# Patient Record
Sex: Male | Born: 1970 | Race: Black or African American | Hispanic: No | Smoking: Current some day smoker
Health system: Southern US, Community
[De-identification: ages and names within clinical notes are randomized; demographics above are authoritative.]

## PROBLEM LIST (undated history)

## (undated) DIAGNOSIS — I10 Essential (primary) hypertension: Secondary | ICD-10-CM

## (undated) DIAGNOSIS — C159 Malignant neoplasm of esophagus, unspecified: Secondary | ICD-10-CM

## (undated) DIAGNOSIS — E119 Type 2 diabetes mellitus without complications: Secondary | ICD-10-CM

---

## 2019-03-11 ENCOUNTER — Other Ambulatory Visit: Payer: Self-pay | Admitting: *Deleted

## 2019-03-11 DIAGNOSIS — Z20822 Contact with and (suspected) exposure to covid-19: Secondary | ICD-10-CM

## 2019-03-17 LAB — NOVEL CORONAVIRUS, NAA: SARS-CoV-2, NAA: NOT DETECTED

## 2020-01-27 ENCOUNTER — Other Ambulatory Visit: Payer: Self-pay

## 2020-01-27 DIAGNOSIS — S01511A Laceration without foreign body of lip, initial encounter: Secondary | ICD-10-CM | POA: Insufficient documentation

## 2020-01-28 ENCOUNTER — Emergency Department (HOSPITAL_COMMUNITY)
Admission: EM | Admit: 2020-01-28 | Discharge: 2020-01-28 | Disposition: A | Discharge status: Home / Self Care | Payer: Self-pay | Attending: Emergency Medicine | Admitting: Emergency Medicine

## 2020-01-28 ENCOUNTER — Encounter (HOSPITAL_COMMUNITY): Payer: Self-pay | Admitting: Emergency Medicine

## 2020-01-28 ENCOUNTER — Other Ambulatory Visit: Payer: Self-pay

## 2020-01-28 DIAGNOSIS — S01511A Laceration without foreign body of lip, initial encounter: Secondary | ICD-10-CM

## 2020-01-28 MED ORDER — BUPIVACAINE HCL (PF) 0.5 % IJ SOLN
10.0000 mL | Freq: Once | INTRAMUSCULAR | Status: DC
  Filled 2020-01-28: qty 30
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
## 2020-01-28 NOTE — ED Notes (Signed)
Pt refused final VS and refused to sign d/c papers.
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
## 2020-06-15 ENCOUNTER — Other Ambulatory Visit: Payer: Self-pay

## 2020-06-15 ENCOUNTER — Ambulatory Visit
Admission: EM | Admit: 2020-06-15 | Discharge: 2020-06-15 | Disposition: A | Discharge status: Home / Self Care | Payer: Self-pay | Attending: Emergency Medicine | Admitting: Emergency Medicine

## 2020-06-15 ENCOUNTER — Encounter: Payer: Self-pay | Admitting: Emergency Medicine

## 2020-06-15 DIAGNOSIS — M25571 Pain in right ankle and joints of right foot: Secondary | ICD-10-CM

## 2020-06-15 MED ORDER — PREDNISONE 10 MG (21) PO TBPK: ORAL_TABLET | Freq: Every day | ORAL | 0 refills | Status: DC

## 2020-06-15 NOTE — ED Triage Notes (Signed)
RT ankle pain and swelling since Monday morning.  Denies any injury.

## 2020-06-15 NOTE — ED Provider Notes (Signed)
[facility]   [id number] 06/15/20 Arrival Time: 0909   Chief Complaint  Patient presents with  . Ankle Pain     SUBJECTIVE: History from: patient.  Trevor [name] is a 50 y.o. male presented to the urgent care for complaint of right ankle pain and swelling that started this past Monday.  Denies any precipitating event, trauma or injury.  He localizes the pain to the right ankle.  He describes the pain as constant and achy.  He has tried OTC medications without relief.  His symptoms are made worse with ROM.  He denies similar symptoms in the past.  Denies chills, fever, nausea, vomiting, diarrhea  ROS: As per HPI.  All other pertinent ROS negative.      History reviewed. No pertinent past medical history. History reviewed. No pertinent surgical history. No Known Allergies No current facility-administered medications on file prior to encounter.   No current outpatient medications on file prior to encounter.   Social History   Socioeconomic History  . Marital status: Single    Spouse name: Not on file  . Number of children: Not on file  . Years of education: Not on file  . Highest education level: Not on file  Occupational History  . Not on file  Tobacco Use  . Smoking status: Current Every Day Smoker    Packs/day: 0.50    Types: Cigarettes  . Smokeless tobacco: Never Used  Substance and Sexual Activity  . Alcohol use: Not Currently  . Drug use: Not Currently  . Sexual activity: Not on file  Other Topics Concern  . Not on file  Social History Narrative  . Not on file   Social Determinants of Health   Financial Resource Strain: Not on file  Food Insecurity: Not on file  Transportation Needs: Not on file  Physical Activity: Not on file  Stress: Not on file  Social Connections: Not on file  Intimate Partner Violence: Not on file   No family history on file.  OBJECTIVE:  Vitals:   06/15/20 0921  BP: (!) 170/125  Pulse: (!) 103  Resp: 18  Temp: 99.4  F (37.4 C)  TempSrc: Oral  SpO2: 98%     Physical Exam Vitals and nursing note reviewed.  Constitutional:      General: He is not in acute distress.    Appearance: Normal appearance. He is normal weight. He is not ill-appearing, toxic-appearing or diaphoretic.  Cardiovascular:     Rate and Rhythm: Normal rate and regular rhythm.     Pulses: Normal pulses.     Heart sounds: Normal heart sounds. No murmur heard. No friction rub. No gallop.   Pulmonary:     Effort: Pulmonary effort is normal. No respiratory distress.     Breath sounds: Normal breath sounds. No stridor. No wheezing, rhonchi or rales.  Chest:     Chest wall: No tenderness.  Musculoskeletal:        General: Tenderness present.     Comments: The right ankle is with obvious deformity when compared to the left ankle.  Warmth and swelling present.  There is no ecchymosis, open wound, lesion classified trauma present.  Limited range of motion due to pain.  Neurovascular status intact.  Neurological:     Mental Status: He is alert and oriented to person, place, and time.      LABS:  No results found for this or any previous visit (from the past 24 hour(s)).   ASSESSMENT & PLAN:  1. Acute right ankle pain     Meds ordered this encounter  Medications  . predniSONE (STERAPRED UNI-PAK 21 TAB) 10 MG (21) TBPK tablet    Sig: Take by mouth daily. Take 6 tabs by mouth daily  for 1 days, then 5 tabs for 1 days, then 4 tabs for 1 days, then 3 tabs for 1 days, 2 tabs for 1 days, then 1 tab by mouth daily for 1 days    Dispense:  21 tablet    Refill:  0   Patient stable at discharge.  Differential diagnosis includes gout.  Will prescribe prednisone.  To continue to take Tylenol and to follow-up with PCP  Discharge instructions  Continue to take Tylenol 500 mg as needed for pain Prednisone was prescribed/take as directed Follow with PCP Follow RICE instruction as attached Return or go to ED if you develop any new or  worsening of your symptoms  Reviewed expectations re: course of current medical issues. Questions answered. Outlined signs and symptoms indicating need for more acute intervention. Patient verbalized understanding. After Visit Summary given.         Emerson Monte, [place] 06/15/20 [phone]

## 2020-06-15 NOTE — Discharge Instructions (Addendum)
Continue to take Tylenol 500 mg as needed for pain Prednisone was prescribed/take as directed Follow with PCP Follow RICE instruction as attached Return or go to ED if you develop any new or worsening of your symptom

## 2020-12-16 ENCOUNTER — Other Ambulatory Visit: Payer: Self-pay

## 2020-12-16 ENCOUNTER — Encounter (HOSPITAL_COMMUNITY): Payer: Self-pay | Admitting: *Deleted

## 2020-12-16 ENCOUNTER — Emergency Department (HOSPITAL_COMMUNITY)
Admission: EM | Admit: 2020-12-16 | Discharge: 2020-12-16 | Disposition: A | Discharge status: Home / Self Care | Payer: Self-pay | Attending: Emergency Medicine | Admitting: Emergency Medicine

## 2020-12-16 ENCOUNTER — Emergency Department (HOSPITAL_COMMUNITY): Payer: Self-pay

## 2020-12-16 DIAGNOSIS — S92514A Nondisplaced fracture of proximal phalanx of right lesser toe(s), initial encounter for closed fracture: Secondary | ICD-10-CM | POA: Insufficient documentation

## 2020-12-16 DIAGNOSIS — M79674 Pain in right toe(s): Secondary | ICD-10-CM | POA: Insufficient documentation

## 2020-12-16 DIAGNOSIS — S92505A Nondisplaced unspecified fracture of left lesser toe(s), initial encounter for closed fracture: Secondary | ICD-10-CM

## 2020-12-16 DIAGNOSIS — Y9301 Activity, walking, marching and hiking: Secondary | ICD-10-CM | POA: Insufficient documentation

## 2020-12-16 DIAGNOSIS — F17210 Nicotine dependence, cigarettes, uncomplicated: Secondary | ICD-10-CM | POA: Insufficient documentation

## 2020-12-16 DIAGNOSIS — W228XXA Striking against or struck by other objects, initial encounter: Secondary | ICD-10-CM | POA: Insufficient documentation

## 2020-12-16 NOTE — ED Provider Notes (Signed)
[facility] EMERGENCY DEPARTMENT Provider Note   CSN: [id number] Arrival date & time: 12/16/20  1248     History Chief Complaint  Patient presents with   Toe Injury    Trevor [name] is a 50 y.o. male.  HPI  50 year old male presenting the emergency department today for evaluation of pain to the right fifth toe that started 2 days ago.  States he was walking in the dark when he hit his foot on a hard object.  He has had pain ever since.  The pain is worse with weightbearing.  It is constant in nature.  He is tried no interventions for his symptoms.  He denies any other injuries at this time.  History reviewed. No pertinent past medical history.  There are no problems to display for this patient.  History reviewed. No pertinent surgical history.   History reviewed. No pertinent family history.  Social History   Tobacco Use   Smoking status: Every Day    Packs/day: 0.50    Types: Cigarettes   Smokeless tobacco: Never  Substance Use Topics   Alcohol use: Yes    Comment: occ use   Drug use: Not Currently    Home Medications Prior to Admission medications   Medication Sig Start Date End Date Taking? Authorizing Provider  predniSONE (STERAPRED UNI-PAK 21 TAB) 10 MG (21) TBPK tablet Take by mouth daily. Take 6 tabs by mouth daily  for 1 days, then 5 tabs for 1 days, then 4 tabs for 1 days, then 3 tabs for 1 days, 2 tabs for 1 days, then 1 tab by mouth daily for 1 days 06/15/20   Emerson Monte, [name]    Allergies    Patient has no known allergies.  Review of Systems   Review of Systems  Constitutional:  Negative for fever.  Musculoskeletal:        Right foot pain   Physical Exam Updated Vital Signs BP (!) 144/92 (BP Location: Right Arm)   Pulse 99   Temp 98.5 F (36.9 C) (Oral)   Resp (!) 22   Ht '5\' 6"'$  (1.676 m)   Wt 77.1 kg Comment: patient unable to stand due to foot pain  SpO2 97%   BMI 27.44 kg/m   Physical Exam Constitutional:      General: He is not  in acute distress.    Appearance: He is well-developed.  Eyes:     Conjunctiva/sclera: Conjunctivae normal.  Cardiovascular:     Rate and Rhythm: Normal rate.  Pulmonary:     Effort: Pulmonary effort is normal.  Musculoskeletal:     Comments: Right 5th toe pain  Skin:    General: Skin is warm and dry.  Neurological:     Mental Status: He is alert and oriented to person, place, and time.    ED Results / Procedures / Treatments   Labs (all labs ordered are listed, but only abnormal results are displayed) Labs Reviewed - No data to display  EKG None  Radiology DG Foot Complete Right  Result Date: 12/16/2020 CLINICAL DATA:  Swelling and pain.  Status post injury. EXAM: RIGHT FOOT COMPLETE - 3+ VIEW COMPARISON:  None. FINDINGS: Subacute fracture deformity is noted involving the base of the fifth proximal phalanx. The fracture line is indistinct but there is surrounding callus formation. No additional fracture or dislocation identified. No radiopaque foreign body or soft tissue calcifications. IMPRESSION: Subacute fracture deformity involves the base of the fifth proximal phalanx. Electronically Signed  By: Kerby Moors M.D.   On: 12/16/2020 13:35    Procedures Procedures   Medications Ordered in ED Medications - No data to display  ED Course  I have reviewed the triage vital signs and the nursing notes.  Pertinent labs & imaging results that were available during my care of the patient were reviewed by me and considered in my medical decision making (see chart for details).    MDM Rules/Calculators/A&P                          50 y/o male who presents to the ED today for eval of toe pain that started 2 days ago  Reviewed/interpreted xray which shows a subacute fracture involving the base of the 5th proximal phalanx  Toe was buddy taped and he was placed a post op shoe. He was given crutches for comfort and advised to f/u with Dr. Althia Forts' office for further assessment.  Advised on return precautions. He voices understanding and is in agreement with plan. All questions answered, pt stable for discharge    Final Clinical Impression(s) / ED Diagnoses Final diagnoses:  Closed nondisplaced fracture of phalanx of lesser toe of left foot, unspecified phalanx, initial encounter    Rx / DC Orders ED Discharge Orders     None        Bishop Dublin 12/16/20 1553    Milton Ferguson, MD 12/17/20 [phone]

## 2020-12-16 NOTE — ED Triage Notes (Signed)
Pt hit right 5th toe and hit it 2 days ago. Pt with swelling noted to right foot. Pt arrived via [facility].  Able to put some weight on right foot.
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
## 2020-12-16 NOTE — Discharge Instructions (Addendum)
Please follow up with Dr. Ruthe Mannan office in 1 week for reassessment.   Please follow up with your primary care provider within 5-7 days for re-evaluation of your symptoms. If you do not have a primary care provider, information for a healthcare clinic has been provided for you to make arrangements for follow up care. Please return to the emergency department for any new or worsening symptoms.

## 2020-12-20 ENCOUNTER — Telehealth: Payer: Self-pay | Admitting: Orthopaedic Surgery

## 2020-12-20 NOTE — Telephone Encounter (Signed)
Called patient regarding 2 sets of forms received in fax; reached voicemail, left message to return call (if possible, prior to appointment) regarding forms process with [employer].

## 2020-12-21 ENCOUNTER — Other Ambulatory Visit: Payer: Self-pay

## 2020-12-21 ENCOUNTER — Encounter: Payer: Self-pay | Admitting: Orthopaedic Surgery

## 2020-12-21 ENCOUNTER — Ambulatory Visit (INDEPENDENT_AMBULATORY_CARE_PROVIDER_SITE_OTHER): Payer: Self-pay | Admitting: Orthopaedic Surgery

## 2020-12-21 VITALS — BP 181/110 | HR 121 | Ht 66.0 in | Wt 167.8 lb

## 2020-12-21 DIAGNOSIS — M10071 Idiopathic gout, right ankle and foot: Secondary | ICD-10-CM

## 2020-12-21 DIAGNOSIS — S92501A Displaced unspecified fracture of right lesser toe(s), initial encounter for closed fracture: Secondary | ICD-10-CM

## 2020-12-21 MED ORDER — ALLOPURINOL 300 MG PO TABS: 300.0000 mg | ORAL_TABLET | Freq: Every day | ORAL | 5 refills | Status: DC

## 2020-12-21 MED ORDER — PREDNISONE 10 MG (21) PO TBPK: ORAL_TABLET | ORAL | 1 refills | Status: DC

## 2020-12-21 MED ORDER — HYDROCODONE-ACETAMINOPHEN 5-325 MG PO TABS: ORAL_TABLET | ORAL | 0 refills | Status: DC

## 2020-12-21 NOTE — Progress Notes (Signed)
My toe is broken.  He had foot pain and swelling on the right, more of the right little toe area about a week ago.  He went to ER and had X-rays showing: IMPRESSION: Subacute fracture deformity involves the base of the fifth proximal phalanx.  He was given post op shoe and told to come here.  He has swelling and redness slightly of the little toe, fourth toe and third toe on the right.  He has marked pain to touch.  He has some swelling of the dorsal right foot.  NV intact.  I asked if he had gout, and he said he used to.  His father had it.  Encounter Diagnoses  Name Primary?   Acute idiopathic gout involving toe of right foot Yes   Closed fracture of phalanx of right fifth toe, initial encounter    I have explained what gout is and that he inherited it.  He will need to take medicine daily.  I will begin allopurinol.  I will give prednisone dose pack.  I will give pain medicine.  Return in one week.  Call if any problem.  Precautions discussed.  Electronically Signed Sanjuana Kava, MD 8/30/202211:18 AM

## 2020-12-21 NOTE — Patient Instructions (Signed)
Note for out of work for 1 week.

## 2020-12-28 ENCOUNTER — Ambulatory Visit: Payer: Self-pay

## 2020-12-28 ENCOUNTER — Encounter: Payer: Self-pay | Admitting: Orthopaedic Surgery

## 2020-12-28 ENCOUNTER — Other Ambulatory Visit: Payer: Self-pay

## 2020-12-28 ENCOUNTER — Ambulatory Visit (INDEPENDENT_AMBULATORY_CARE_PROVIDER_SITE_OTHER): Payer: Self-pay | Admitting: Orthopaedic Surgery

## 2020-12-28 DIAGNOSIS — S92501D Displaced unspecified fracture of right lesser toe(s), subsequent encounter for fracture with routine healing: Secondary | ICD-10-CM

## 2020-12-28 DIAGNOSIS — F17210 Nicotine dependence, cigarettes, uncomplicated: Secondary | ICD-10-CM

## 2020-12-28 DIAGNOSIS — M10071 Idiopathic gout, right ankle and foot: Secondary | ICD-10-CM

## 2020-12-28 MED ORDER — COLCHICINE 0.6 MG PO TABS: ORAL_TABLET | ORAL | 3 refills | Status: DC

## 2020-12-28 NOTE — Progress Notes (Signed)
My foot is better.  He took the allopurinol and the prednisone and his right foot is much improved.  He still has pain of the little toe.  Serum uric acid was 7.0  High normal.  I want him to continue the allopurinol.  I will add colchicine.  He has much less swelling of the foot, there is no redness.  Motion is good.  He is using a cane.  NV intact.  Encounter Diagnoses  Name Primary?   Closed fracture of phalanx of right fifth toe with routine healing, subsequent encounter Yes   Acute idiopathic gout involving toe of right foot    Nicotine dependence, cigarettes, uncomplicated    Return in two weeks.  Return to work 01-03-21.  Call if any problem.  Precautions discussed.  X-rays on return of the right little toe.  Electronically Signed Sanjuana Kava, MD 9/6/202210:25 AM

## 2020-12-28 NOTE — Patient Instructions (Addendum)
Steps to Quit Smoking Smoking tobacco is the leading cause of preventable death. It can affect almost every organ in the body. Smoking puts you and people around you at risk for many serious, long-lasting (chronic) diseases. Quitting smoking can be hard, but it is one of the best things that you can do for your health. It is never too late to quit. How do I get ready to quit? When you decide to quit smoking, make a plan to help you succeed. Before you quit: Pick a date to quit. Set a date within the next 2 weeks to give you time to prepare. Write down the reasons why you are quitting. Keep this list in places where you will see it often. Tell your family, friends, and co-workers that you are quitting. Their support is important. Talk with your doctor about the choices that may help you quit. Find out if your health insurance will pay for these treatments. Know the people, places, things, and activities that make you want to smoke (triggers). Avoid them. What first steps can I take to quit smoking? Throw away all cigarettes at home, at work, and in your car. Throw away the things that you use when you smoke, such as ashtrays and lighters. Clean your car. Make sure to empty the ashtray. Clean your home, including curtains and carpets. What can I do to help me quit smoking? Talk with your doctor about taking medicines and seeing a counselor at the same time. You are more likely to succeed when you do both. If you are pregnant or breastfeeding, talk with your doctor about counseling or other ways to quit smoking. Do not take medicine to help you quit smoking unless your doctor tells you to do so. To quit smoking: Quit right away Quit smoking totally, instead of slowly cutting back on how much you smoke over a period of time. Go to counseling. You are more likely to quit if you go to counseling sessions regularly. Take medicine You may take medicines to help you quit. Some medicines need a  prescription, and some you can buy over-the-counter. Some medicines may contain a drug called nicotine to replace the nicotine in cigarettes. Medicines may: Help you to stop having the desire to smoke (cravings). Help to stop the problems that come when you stop smoking (withdrawal symptoms). Your doctor may ask you to use: Nicotine patches, gum, or lozenges. Nicotine inhalers or sprays. Non-nicotine medicine that is taken by mouth. Find resources Find resources and other ways to help you quit smoking and remain smoke-free after you quit. These resources are most helpful when you use them often. They include: Online chats with a counselor. Phone quitlines. Printed self-help materials. Support groups or group counseling. Text messaging programs. Mobile phone apps. Use apps on your mobile phone or tablet that can help you stick to your quit plan. There are many free apps for mobile phones and tablets as well as websites. Examples include Quit Guide from the CDC and smokefree.gov  What things can I do to make it easier to quit?  Talk to your family and friends. Ask them to support and encourage you. Call a phone quitline (1-800-QUIT-NOW), reach out to support groups, or work with a counselor. Ask people who smoke to not smoke around you. Avoid places that make you want to smoke, such as: Bars. Parties. Smoke-break areas at work. Spend time with people who do not smoke. Lower the stress in your life. Stress can make you want to   smoke. Try these things to help your stress: Getting regular exercise. Doing deep-breathing exercises. Doing yoga. Meditating. Doing a body scan. To do this, close your eyes, focus on one area of your body at a time from head to toe. Notice which parts of your body are tense. Try to relax the muscles in those areas. How will I feel when I quit smoking? Day 1 to 3 weeks Within the first 24 hours, you may start to have some problems that come from quitting tobacco.  These problems are very bad 2-3 days after you quit, but they do not often last for more than 2-3 weeks. You may get these symptoms: Mood swings. Feeling restless, nervous, angry, or annoyed. Trouble concentrating. Dizziness. Strong desire for high-sugar foods and nicotine. Weight gain. Trouble pooping (constipation). Feeling like you may vomit (nausea). Coughing or a sore throat. Changes in how the medicines that you take for other issues work in your body. Depression. Trouble sleeping (insomnia). Week 3 and afterward After the first 2-3 weeks of quitting, you may start to notice more positive results, such as: Better sense of smell and taste. Less coughing and sore throat. Slower heart rate. Lower blood pressure. Clearer skin. Better breathing. Fewer sick days. Quitting smoking can be hard. Do not give up if you fail the first time. Some people need to try a few times before they succeed. Do your best to stick to your quit plan, and talk with your doctor if you have any questions or concerns. Summary Smoking tobacco is the leading cause of preventable death. Quitting smoking can be hard, but it is one of the best things that you can do for your health. When you decide to quit smoking, make a plan to help you succeed. Quit smoking right away, not slowly over a period of time. When you start quitting, seek help from your doctor, family, or friends. This information is not intended to replace advice given to you by your health care provider. Make sure you discuss any questions you have with your health care provider. Document Revised: 01/03/2019 Document Reviewed: 06/29/2018 Elsevier Patient Education  2022 [employer].  Return to work on 01-03-21

## 2021-01-11 ENCOUNTER — Ambulatory Visit: Payer: Self-pay | Admitting: Orthopaedic Surgery

## 2021-05-30 ENCOUNTER — Encounter (HOSPITAL_COMMUNITY): Payer: Self-pay | Admitting: *Deleted

## 2021-05-30 ENCOUNTER — Other Ambulatory Visit: Payer: Self-pay

## 2021-05-30 ENCOUNTER — Emergency Department (HOSPITAL_COMMUNITY): Payer: Medicaid Other

## 2021-05-30 ENCOUNTER — Inpatient Hospital Stay (HOSPITAL_COMMUNITY)
Admission: EM | Admit: 2021-05-30 | Discharge: 2021-06-01 | DRG: 641 | Disposition: A | Discharge status: Home / Self Care | Payer: Medicaid Other | Attending: Internal Medicine | Admitting: Internal Medicine

## 2021-05-30 DIAGNOSIS — F1010 Alcohol abuse, uncomplicated: Secondary | ICD-10-CM | POA: Diagnosis present

## 2021-05-30 DIAGNOSIS — Z79899 Other long term (current) drug therapy: Secondary | ICD-10-CM

## 2021-05-30 DIAGNOSIS — Z833 Family history of diabetes mellitus: Secondary | ICD-10-CM

## 2021-05-30 DIAGNOSIS — M1A9XX0 Chronic gout, unspecified, without tophus (tophi): Secondary | ICD-10-CM

## 2021-05-30 DIAGNOSIS — E119 Type 2 diabetes mellitus without complications: Secondary | ICD-10-CM | POA: Diagnosis present

## 2021-05-30 DIAGNOSIS — Z597 Insufficient social insurance and welfare support: Secondary | ICD-10-CM

## 2021-05-30 DIAGNOSIS — M109 Gout, unspecified: Secondary | ICD-10-CM | POA: Diagnosis present

## 2021-05-30 DIAGNOSIS — E559 Vitamin D deficiency, unspecified: Secondary | ICD-10-CM

## 2021-05-30 DIAGNOSIS — Z720 Tobacco use: Secondary | ICD-10-CM | POA: Diagnosis present

## 2021-05-30 DIAGNOSIS — R000 Tachycardia, unspecified: Secondary | ICD-10-CM | POA: Diagnosis present

## 2021-05-30 DIAGNOSIS — Z8249 Family history of ischemic heart disease and other diseases of the circulatory system: Secondary | ICD-10-CM

## 2021-05-30 DIAGNOSIS — K219 Gastro-esophageal reflux disease without esophagitis: Secondary | ICD-10-CM | POA: Diagnosis present

## 2021-05-30 DIAGNOSIS — Z20822 Contact with and (suspected) exposure to covid-19: Secondary | ICD-10-CM | POA: Diagnosis present

## 2021-05-30 DIAGNOSIS — E876 Hypokalemia: Principal | ICD-10-CM | POA: Diagnosis present

## 2021-05-30 DIAGNOSIS — I10 Essential (primary) hypertension: Secondary | ICD-10-CM | POA: Diagnosis present

## 2021-05-30 DIAGNOSIS — F17210 Nicotine dependence, cigarettes, uncomplicated: Secondary | ICD-10-CM | POA: Diagnosis present

## 2021-05-30 DIAGNOSIS — I169 Hypertensive crisis, unspecified: Secondary | ICD-10-CM | POA: Diagnosis present

## 2021-05-30 HISTORY — DX: Essential (primary) hypertension: I10

## 2021-05-30 HISTORY — DX: Type 2 diabetes mellitus without complications: E11.9

## 2021-05-30 LAB — RESP PANEL BY RT-PCR (FLU A&B, COVID) ARPGX2
Influenza A by PCR: NEGATIVE
Influenza B by PCR: NEGATIVE
SARS Coronavirus 2 by RT PCR: NEGATIVE

## 2021-05-30 LAB — CBC WITH DIFFERENTIAL/PLATELET
Abs Immature Granulocytes: 0.02 10*3/uL (ref 0.00–0.07)
Basophils Absolute: 0 10*3/uL (ref 0.0–0.1)
Basophils Relative: 1 %
Eosinophils Absolute: 0 10*3/uL (ref 0.0–0.5)
Eosinophils Relative: 0 %
HCT: 33.6 % — ABNORMAL LOW (ref 39.0–52.0)
Hemoglobin: 11.2 g/dL — ABNORMAL LOW (ref 13.0–17.0)
Immature Granulocytes: 0 %
Lymphocytes Relative: 23 %
Lymphs Abs: 1.8 10*3/uL (ref 0.7–4.0)
MCH: 30 pg (ref 26.0–34.0)
MCHC: 33.3 g/dL (ref 30.0–36.0)
MCV: 90.1 fL (ref 80.0–100.0)
Monocytes Absolute: 0.5 10*3/uL (ref 0.1–1.0)
Monocytes Relative: 6 %
Neutro Abs: 5.6 10*3/uL (ref 1.7–7.7)
Neutrophils Relative %: 70 %
Platelets: 336 10*3/uL (ref 150–400)
RBC: 3.73 MIL/uL — ABNORMAL LOW (ref 4.22–5.81)
RDW: 17.3 % — ABNORMAL HIGH (ref 11.5–15.5)
WBC: 8 10*3/uL (ref 4.0–10.5)
nRBC: 0 % (ref 0.0–0.2)

## 2021-05-30 LAB — COMPREHENSIVE METABOLIC PANEL
ALT: 16 U/L (ref 0–44)
AST: 41 U/L (ref 15–41)
Albumin: 2.8 g/dL — ABNORMAL LOW (ref 3.5–5.0)
Alkaline Phosphatase: 86 U/L (ref 38–126)
Anion gap: 13 (ref 5–15)
BUN: 5 mg/dL — ABNORMAL LOW (ref 6–20)
CO2: 32 mmol/L (ref 22–32)
Calcium: 8.1 mg/dL — ABNORMAL LOW (ref 8.9–10.3)
Chloride: 94 mmol/L — ABNORMAL LOW (ref 98–111)
Creatinine, Ser: 0.54 mg/dL — ABNORMAL LOW (ref 0.61–1.24)
GFR, Estimated: >60 mL/min (ref >60)
Glucose, Bld: 95 mg/dL (ref 70–99)
Potassium: 2.2 mmol/L — CL (ref 3.5–5.1)
Sodium: 139 mmol/L (ref 135–145)
Total Bilirubin: 0.6 mg/dL (ref 0.3–1.2)
Total Protein: 6.4 g/dL — ABNORMAL LOW (ref 6.5–8.1)

## 2021-05-30 LAB — RAPID URINE DRUG SCREEN, HOSP PERFORMED
Amphetamines: NOT DETECTED
Barbiturates: NOT DETECTED
Benzodiazepines: NOT DETECTED
Cocaine: NOT DETECTED
Opiates: NOT DETECTED
Tetrahydrocannabinol: NOT DETECTED

## 2021-05-30 LAB — VITAMIN D 25 HYDROXY (VIT D DEFICIENCY, FRACTURES): Vit D, 25-Hydroxy: 16.15 ng/mL — ABNORMAL LOW (ref 30–100)

## 2021-05-30 LAB — ETHANOL: Alcohol, Ethyl (B): 66 mg/dL — ABNORMAL HIGH (ref <10)

## 2021-05-30 LAB — VITAMIN B12: Vitamin B-12: 313 pg/mL (ref 180–914)

## 2021-05-30 LAB — TSH: TSH: 0.365 u[IU]/mL (ref 0.350–4.500)

## 2021-05-30 LAB — MAGNESIUM: Magnesium: 1.4 mg/dL — ABNORMAL LOW (ref 1.7–2.4)

## 2021-05-30 MED ORDER — ONDANSETRON HCL 4 MG/2ML IJ SOLN: 4.0000 mg | Freq: Four times a day (QID) | INTRAMUSCULAR | Status: DC | PRN

## 2021-05-30 MED ORDER — PANTOPRAZOLE SODIUM 40 MG PO TBEC
40.0000 mg | DELAYED_RELEASE_TABLET | Freq: Every day | ORAL | Status: DC
  Administered 2021-05-30 – 2021-06-01 (×3): 40 mg via ORAL
  Filled 2021-05-30 (×3): qty 1

## 2021-05-30 MED ORDER — HYDRALAZINE HCL 20 MG/ML IJ SOLN
20.0000 mg | Freq: Once | INTRAMUSCULAR | Status: AC
  Administered 2021-05-30: 20 mg via INTRAVENOUS
  Filled 2021-05-30: qty 1

## 2021-05-30 MED ORDER — NICOTINE 14 MG/24HR TD PT24
14.0000 mg | MEDICATED_PATCH | Freq: Every day | TRANSDERMAL | Status: DC
  Administered 2021-05-30 – 2021-06-01 (×3): 14 mg via TRANSDERMAL
  Filled 2021-05-30 (×3): qty 1

## 2021-05-30 MED ORDER — METOPROLOL TARTRATE 5 MG/5ML IV SOLN
5.0000 mg | Freq: Once | INTRAVENOUS | Status: AC
  Administered 2021-05-30: 5 mg via INTRAVENOUS
  Filled 2021-05-30: qty 5

## 2021-05-30 MED ORDER — ACETAMINOPHEN 650 MG RE SUPP: 650.0000 mg | Freq: Four times a day (QID) | RECTAL | Status: DC | PRN

## 2021-05-30 MED ORDER — ENOXAPARIN SODIUM 40 MG/0.4ML IJ SOSY
40.0000 mg | PREFILLED_SYRINGE | INTRAMUSCULAR | Status: DC
  Administered 2021-05-30 – 2021-05-31 (×2): 40 mg via SUBCUTANEOUS
  Filled 2021-05-30 (×3): qty 0.4

## 2021-05-30 MED ORDER — THIAMINE HCL 100 MG PO TABS
100.0000 mg | ORAL_TABLET | Freq: Every day | ORAL | Status: DC
  Administered 2021-05-30 – 2021-06-01 (×2): 100 mg via ORAL
  Filled 2021-05-30 (×2): qty 1

## 2021-05-30 MED ORDER — LABETALOL HCL 200 MG PO TABS
200.0000 mg | ORAL_TABLET | Freq: Two times a day (BID) | ORAL | Status: DC
  Administered 2021-05-30 – 2021-06-01 (×5): 200 mg via ORAL
  Filled 2021-05-30 (×5): qty 1

## 2021-05-30 MED ORDER — POTASSIUM CHLORIDE 10 MEQ/100ML IV SOLN
10.0000 meq | Freq: Once | INTRAVENOUS | Status: AC
  Administered 2021-05-30: 10 meq via INTRAVENOUS
  Filled 2021-05-30: qty 100

## 2021-05-30 MED ORDER — MAGNESIUM SULFATE 2 GM/50ML IV SOLN
2.0000 g | Freq: Once | INTRAVENOUS | Status: AC
  Administered 2021-05-30: 2 g via INTRAVENOUS
  Filled 2021-05-30: qty 50

## 2021-05-30 MED ORDER — LORAZEPAM 1 MG PO TABS: 1.0000 mg | ORAL_TABLET | ORAL | Status: DC | PRN

## 2021-05-30 MED ORDER — POTASSIUM CHLORIDE CRYS ER 20 MEQ PO TBCR
40.0000 meq | EXTENDED_RELEASE_TABLET | Freq: Once | ORAL | Status: AC
  Administered 2021-05-30: 40 meq via ORAL
  Filled 2021-05-30: qty 2

## 2021-05-30 MED ORDER — LORAZEPAM 2 MG/ML IJ SOLN: 1.0000 mg | INTRAMUSCULAR | Status: DC | PRN

## 2021-05-30 MED ORDER — ADULT MULTIVITAMIN W/MINERALS CH
1.0000 | ORAL_TABLET | Freq: Every day | ORAL | Status: DC
  Administered 2021-05-30 – 2021-06-01 (×3): 1 via ORAL
  Filled 2021-05-30 (×3): qty 1

## 2021-05-30 MED ORDER — FOLIC ACID 1 MG PO TABS
1.0000 mg | ORAL_TABLET | Freq: Every day | ORAL | Status: DC
  Administered 2021-05-30 – 2021-06-01 (×3): 1 mg via ORAL
  Filled 2021-05-30 (×3): qty 1

## 2021-05-30 MED ORDER — ACETAMINOPHEN 325 MG PO TABS: 650.0000 mg | ORAL_TABLET | Freq: Four times a day (QID) | ORAL | Status: DC | PRN

## 2021-05-30 MED ORDER — THIAMINE HCL 100 MG/ML IJ SOLN
100.0000 mg | Freq: Every day | INTRAMUSCULAR | Status: DC
  Administered 2021-05-31: 100 mg via INTRAVENOUS
  Filled 2021-05-30: qty 2

## 2021-05-30 NOTE — Assessment & Plan Note (Addendum)
-  In the setting of alcohol abuse, decreased oral intake and GI losses prior to admission. -No further episode of nausea or vomiting -Patient reports that he is tolerating diet. -Magnesium was also low. -Continued to replete electrolytes and follow trend. -K = 3.7 on day of d/c

## 2021-05-30 NOTE — Assessment & Plan Note (Addendum)
-  Continue PPI. -Continue as needed antiemetics. -Lifestyle changes discussed with patient.

## 2021-05-30 NOTE — Assessment & Plan Note (Addendum)
-  Prior history of gout -No acute flare currently -Patient expressed that he is no longer taking allopurinol as previously prescribed, because he can not afford it. -Patient received education about increased risk for flares with consumption of alcohol, beer and certain foods. -Reported acquiring medical insurance in March; will like to have prescriptions at discharge to resewn when able to acquire medications. -Advised to keep himself well-hydrated.

## 2021-05-30 NOTE — Assessment & Plan Note (Addendum)
-  Cessation counseling provided -Nicotine patch has been ordered -Patient contemplating quitting.

## 2021-05-30 NOTE — Assessment & Plan Note (Addendum)
-  In the setting of alcohol abuse and electrolytes abnormalities. -Telemetry monitoring has been ordered. -Continue to replete electrolytes as needed and follow trend. -Alcohol cessation provided -Normal TSH. -improved with fluid resuscitation

## 2021-05-30 NOTE — ED Triage Notes (Signed)
Pt c/o bilateral foot pain that he describes as "pins and needles" and his feet feeling frozen.

## 2021-05-30 NOTE — Assessment & Plan Note (Addendum)
-  With hypertensive crisis at time of admission. -No signs of endorgan damage appreciated. D/c home with amlodipine Follow up PCP

## 2021-05-30 NOTE — H&P (Signed)
History and Physical    Patient: Trevor [name] BDZ:[id number] DOB: [birth year] DOA: 05/30/2021 DOS: the patient was seen and examined on 05/30/2021 PCP: Patient, No Pcp Per (Inactive)  Patient coming from: Home  Chief Complaint:  Chief Complaint  Patient presents with   Foot Pain    HPI: Trevor [name] is a 51 y.o. male with medical history significant of patient have a prior history of prediabetes; not taking any medications for it.  Hypertension and gout; due to financial constraints and lack of insurance previously he is not taking any medications or following with any provider.  Patient expressed acquiring insurance and be eligible for care on March 3.  He is currently working.  Patient reported also history of tobacco and alcohol abuse. Patient has presented to the emergency department with complaints of bilateral feet numbness/tingling and pain.  The symptoms has been present for the last 2 days or so and worsening.  Patient has also expressed intermittent episodes of nausea/vomiting in the last 24 hours prior to admission with decrease in oral intake. Patient denies hematemesis, melena, hematochezia, dysuria, hematuria, chest pain, shortness of breath, focal weakness and abdominal pain.  In the ED: Work-up demonstrated hypertensive crisis, severe hypokalemia with a potassium of 2.2, B12 level of 313, alcohol 66, magnesium 1.4; chloride 94; normal WBCs, hemoglobin 11.2 and normal platelets.  Review of Systems: As mentioned in the history of present illness. All other systems reviewed and are negative.  Past Medical History:  Diagnosis Date   Diabetes mellitus without complication ([place])    Hypertension    History reviewed. No pertinent surgical history.   Social History:  reports that he has been smoking cigarettes. He has been smoking an average of .5 packs per day. He has never used smokeless tobacco. He reports current alcohol use. He reports that he does not currently use drugs.  No Known  Allergies  Family history: Patient reports family history of hypertension and diabetes.  Prior to Admission medications   Medication Sig Start Date End Date Taking? Authorizing Provider  allopurinol (ZYLOPRIM) 300 MG tablet Take 1 tablet (300 mg total) by mouth daily. Patient not taking: Reported on 05/30/2021 12/21/20   Sanjuana Kava, MD  colchicine 0.6 MG tablet One by mouth three times a day for five days for gout pain. Patient not taking: Reported on 05/30/2021 12/28/20   Sanjuana Kava, MD  HYDROcodone-acetaminophen (NORCO/VICODIN) 5-325 MG tablet One tablet every four hours as needed for acute pain.  Limit of five days per [place] statue. Patient not taking: Reported on 05/30/2021 12/21/20   Sanjuana Kava, MD    Physical Exam: Vitals:   05/30/21 1345 05/30/21 1400 05/30/21 1700 05/30/21 1730  BP:  (!) 173/130 133/85 140/79  Pulse: (!) 103 (!) 102 (!) 106 99  Resp: 15   16  Temp:      TempSrc:      SpO2: 100% 95% 92% 99%  Weight:      Height:       General exam: Alert, awake, oriented x 3, no chest pain, no signs of acute withdrawal symptoms.  Reports numbness/tingling on his feet bilateral. Respiratory system: No wheezing, no crackles, no using accessory muscle.  Respiratory effort normal.  Positive scattered rhonchi.  Good saturation on room air. Cardiovascular system: Sinus tachycardia, no rubs, no gallops, no JVD. Gastrointestinal system: Abdomen is nondistended, soft and nontender. No organomegaly or masses felt. Normal bowel sounds heard. Central nervous system: Alert and oriented. No focal neurological deficits. Extremities:  No cyanosis or clubbing; trace edema appreciated bilaterally.  There is no swelling or erythematous changes affecting the joints of his feet. Skin: No rashes or petechiae. Psychiatry: Judgement and insight appear normal. Mood & affect appropriate.    Data Reviewed: Normal WBCs, hemoglobin 11.2, normal platelets. Chloride 94, potassium 2.2, normal renal  function. Patient with hypertensive crisis on presentation systolic blood pressure in the 190 range and diastolic blood pressure up to 115. B12 313 COVID PCR and influenza negative. Alcohol 66 UDS negative for Opiates, cocaine, benzos, marijuana, barbiturates and amphetamines.  Assessment and Plan: * Hypokalemia- (present on admission) -In the setting of alcohol abuse, decreased oral intake and couple episode of nausea/vomiting prior to admission. -Will admit to telemetry -Check magnesium level -Provide electrolyte repletion's and follow trend.  Tobacco abuse- (present on admission) -Cessation counseling provided -Nicotine patch has been ordered -Patient smoke about half a pack on daily basis.  Alcohol abuse- (present on admission) -Cessation counseling has been provided -At time of admission alcohol was 66 -Patient drink of preference is beer; he said that he normally drinks about a sixpack after work and when he is off might drink a little bit more.  Last drink happened the night prior to admission. -CIWA protocol in place -Thiamine and folic acid has been ordered.  GERD (gastroesophageal reflux disease)- (present on admission) -Continue the use of PPI -As needed antiemetics will be also provided.  Gout- (present on admission) -Prior history of gout -No acute flare currently -Patient expressed that he is no longer taking allopurinol as previously prescribed he cannot afford it. -Educated about how beer/alcohol can worsen gout condition.  Tachycardia, unspecified- (present on admission) -In the setting of alcohol abuse and electrolytes abnormalities. -Telemetry monitoring has been ordered. -Replete electrolytes -Patient has been started on labetalol to assist with blood pressure management. -Alcohol cessation counseling provided. -Follow heart rate. -Will check TSH.  HTN (hypertension)- (present on admission) -With hypertensive crisis at time of admission -No signs of  endorgan damage appreciated -Patient educated about importance of heart healthy/low-sodium diet and to stop alcohol intake. -Will initiate treatment with the use of labetalol -Follow vital signs and adjust antihypertensive regimen as required.    Advance Care Planning:   Code Status: Full Code   Consults: None  Family Communication: No family at bedside  Severity of Illness: The appropriate patient status for this patient is OBSERVATION. Observation status is judged to be reasonable and necessary in order to provide the required intensity of service to ensure the patient's safety. The patient's presenting symptoms, physical exam findings, and initial radiographic and laboratory data in the context of their medical condition is felt to place them at decreased risk for further clinical deterioration. Furthermore, it is anticipated that the patient will be medically stable for discharge from the hospital within 2 midnights of admission.   Author: Barton Dubois, MD 05/30/2021 6:09 PM  For on call review [email or web address].

## 2021-05-30 NOTE — Assessment & Plan Note (Addendum)
-  Cessation counseling has been provided -At time of admission alcohol was 66 -Continue CIWA protocol -Continue thiamine and folic acid. -no signs of withdrawal

## 2021-05-30 NOTE — ED Provider Notes (Signed)
[facility] EMERGENCY DEPARTMENT Provider Note   CSN: [id number] Arrival date & time: 05/30/21  7106     History  Chief Complaint  Patient presents with   Foot Pain    Trevor [name] is a 51 y.o. male.  Patient presents with numbness to the bottom of both of his feet for a couple weeks along with occasional vomiting.  Patient has a history of gout  The history is provided by the patient and medical records. No language interpreter was used.  Foot Pain This is a new problem. The current episode started more than 2 days ago. The problem occurs constantly. The problem has been rapidly worsening. Pertinent negatives include no chest pain, no abdominal pain and no headaches. Nothing aggravates the symptoms. Nothing relieves the symptoms. He has tried nothing for the symptoms. The treatment provided no relief.      Home Medications Prior to Admission medications   Medication Sig Start Date End Date Taking? Authorizing Provider  allopurinol (ZYLOPRIM) 300 MG tablet Take 1 tablet (300 mg total) by mouth daily. Patient not taking: Reported on 05/30/2021 12/21/20   Sanjuana Kava, MD  colchicine 0.6 MG tablet One by mouth three times a day for five days for gout pain. Patient not taking: Reported on 05/30/2021 12/28/20   Sanjuana Kava, MD  HYDROcodone-acetaminophen (NORCO/VICODIN) 5-325 MG tablet One tablet every four hours as needed for acute pain.  Limit of five days per [place] statue. Patient not taking: Reported on 05/30/2021 12/21/20   Sanjuana Kava, MD      Allergies    Patient has no known allergies.    Review of Systems   Review of Systems  Constitutional:  Negative for appetite change and fatigue.  HENT:  Negative for congestion, ear discharge and sinus pressure.   Eyes:  Negative for discharge.  Respiratory:  Negative for cough.   Cardiovascular:  Negative for chest pain.  Gastrointestinal:  Negative for abdominal pain and diarrhea.  Genitourinary:  Negative for frequency and  hematuria.  Musculoskeletal:  Negative for back pain.       Numbness to feet  Skin:  Negative for rash.  Neurological:  Negative for seizures and headaches.  Psychiatric/Behavioral:  Negative for hallucinations.    Physical Exam Updated Vital Signs BP (!) 173/115    Pulse (!) 103    Temp 97.7 F (36.5 C) (Oral)    Resp 15    Ht 5' 6.5" (1.689 m)    Wt 76.2 kg    SpO2 100%    BMI 26.71 kg/m  Physical Exam Vitals and nursing note reviewed.  Constitutional:      Appearance: He is well-developed.  HENT:     Head: Normocephalic.     Nose: Nose normal.  Eyes:     General: No scleral icterus.    Conjunctiva/sclera: Conjunctivae normal.  Neck:     Thyroid: No thyromegaly.  Cardiovascular:     Rate and Rhythm: Regular rhythm. Tachycardia present.     Heart sounds: No murmur heard.   No friction rub. No gallop.  Pulmonary:     Breath sounds: No stridor. No wheezing or rales.  Chest:     Chest wall: No tenderness.  Abdominal:     General: There is no distension.     Tenderness: There is no abdominal tenderness. There is no rebound.  Musculoskeletal:        General: Normal range of motion.     Cervical back: Neck supple.  Lymphadenopathy:  Cervical: No cervical adenopathy.  Skin:    Findings: No erythema or rash.  Neurological:     Mental Status: He is alert and oriented to person, place, and time.     Motor: No abnormal muscle tone.     Coordination: Coordination normal.  Psychiatric:        Behavior: Behavior normal.    ED Results / Procedures / Treatments   Labs (all labs ordered are listed, but only abnormal results are displayed) Labs Reviewed  CBC WITH DIFFERENTIAL/PLATELET - Abnormal; Notable for the following components:      Result Value   RBC 3.73 (*)    Hemoglobin 11.2 (*)    HCT 33.6 (*)    RDW 17.3 (*)    All other components within normal limits  COMPREHENSIVE METABOLIC PANEL - Abnormal; Notable for the following components:   Potassium 2.2 (*)     Chloride 94 (*)    BUN 5 (*)    Creatinine, Ser 0.54 (*)    Calcium 8.1 (*)    Total Protein 6.4 (*)    Albumin 2.8 (*)    All other components within normal limits    EKG EKG Interpretation  Date/Time:  Monday May 30 2021 12:08:58 EST Ventricular Rate:  115 PR Interval:  140 QRS Duration: 89 QT Interval:  366 QTC Calculation: 507 R Axis:   -6 Text Interpretation: Sinus tachycardia Probable left atrial enlargement LVH with secondary repolarization abnormality Prolonged QT interval Confirmed by Milton Ferguson [phone]) on 05/30/2021 1:05:20 PM  Radiology DG Chest Port 1 View  Result Date: 05/30/2021 CLINICAL DATA:  Weakness, bilateral foot swelling without chest pain or shortness of breath. EXAM: PORTABLE CHEST 1 VIEW COMPARISON:  None. FINDINGS: The heart size and mediastinal contours are within normal limits. Aortic atherosclerosis. Carotid artery calcifications. No focal airspace consolidation. No visible pleural effusion or pneumothorax. The visualized skeletal structures are unremarkable. IMPRESSION: No acute cardiopulmonary disease on this single view radiograph of the chest. Electronically Signed   By: Dahlia Bailiff M.D.   On: 05/30/2021 13:29    Procedures Procedures    Medications Ordered in ED Medications  potassium chloride 10 mEq in 100 mL IVPB (10 mEq Intravenous New Bag/Given 05/30/21 1321)  hydrALAZINE (APRESOLINE) injection 20 mg (has no administration in time range)  potassium chloride 10 mEq in 100 mL IVPB (10 mEq Intravenous New Bag/Given 05/30/21 1206)  potassium chloride SA (KLOR-CON M) CR tablet 40 mEq (40 mEq Oral Given 05/30/21 1143)  metoprolol tartrate (LOPRESSOR) injection 5 mg (5 mg Intravenous Given 05/30/21 1318)   CRITICAL CARE Performed by: Milton Ferguson Total critical care time: 40 minutes Critical care time was exclusive of separately billable procedures and treating other patients. Critical care was necessary to treat or prevent imminent or  life-threatening deterioration. Critical care was time spent personally by me on the following activities: development of treatment plan with patient and/or surrogate as well as nursing, discussions with consultants, evaluation of patient's response to treatment, examination of patient, obtaining history from patient or surrogate, ordering and performing treatments and interventions, ordering and review of laboratory studies, ordering and review of radiographic studies, pulse oximetry and re-evaluation of patient's condition.  ED Course/ Medical Decision Making/ A&P                           Medical Decision Making Amount and/or Complexity of Data Reviewed Labs: ordered. Radiology: ordered. ECG/medicine tests: ordered.  Risk Prescription drug  management. Decision regarding hospitalization.   Patient with hypokalemia, poorly controlled blood pressure, tachycardia.  He will be admitted to medicine to replace potassium and control his blood pressure   This patient presents to the ED for concern of numbness to feet, this involves an extensive number of treatment options, and is a complaint that carries with it a high risk of complications and morbidity.  The differential diagnosis includes stroke, neuropathy, alcohol induced   Co morbidities that complicate the patient evaluation  EtOH abuse   Additional history obtained:  Additional history obtained from patient External records from outside source obtained and reviewed including hospital records   Lab Tests:  I Ordered, and personally interpreted labs.  The pertinent results include: Potassium low at 2.2   Imaging Studies ordered:  I ordered imaging studies including chest x-ray I independently visualized and interpreted imaging which showed unremarkable I agree with the radiologist interpretation   Cardiac Monitoring:  The patient was maintained on a cardiac monitor.  I personally viewed and interpreted the cardiac  monitored which showed an underlying rhythm of: Sinus tach   Medicines ordered and prescription drug management:  I ordered medication including Lopressor for blood pressure and potassium for low potassium Reevaluation of the patient after these medicines showed that the patient improved I have reviewed the patients home medicines and have made adjustments as needed   Test Considered:  CT abdomen   Critical Interventions:  IV potassium replacement   Consultations Obtained:  I requested consultation with the hospitalist,  and discussed lab and imaging findings as well as pertinent plan - they recommend: Admission for potassium replaced   Problem List / ED Course:  Hypertension, neuritis, hypokalemia   Reevaluation:  After the interventions noted above, I reevaluated the patient and found that they have :improved   Social Determinants of Health:  EtOH abuse   Dispostion:  After consideration of the diagnostic results and the patients response to treatment, I feel that the patent would benefit from admission and IV potassium.         Final Clinical Impression(s) / ED Diagnoses Final diagnoses:  Hypokalemia    Rx / DC Orders ED Discharge Orders     None         Milton Ferguson, MD 05/31/21 [phone]

## 2021-05-31 DIAGNOSIS — Z597 Insufficient social insurance and welfare support: Secondary | ICD-10-CM | POA: Diagnosis not present

## 2021-05-31 DIAGNOSIS — E559 Vitamin D deficiency, unspecified: Secondary | ICD-10-CM

## 2021-05-31 DIAGNOSIS — R000 Tachycardia, unspecified: Secondary | ICD-10-CM | POA: Diagnosis present

## 2021-05-31 DIAGNOSIS — I10 Essential (primary) hypertension: Secondary | ICD-10-CM | POA: Diagnosis present

## 2021-05-31 DIAGNOSIS — Z79899 Other long term (current) drug therapy: Secondary | ICD-10-CM | POA: Diagnosis not present

## 2021-05-31 DIAGNOSIS — Z8249 Family history of ischemic heart disease and other diseases of the circulatory system: Secondary | ICD-10-CM | POA: Diagnosis not present

## 2021-05-31 DIAGNOSIS — F17210 Nicotine dependence, cigarettes, uncomplicated: Secondary | ICD-10-CM | POA: Diagnosis present

## 2021-05-31 DIAGNOSIS — E119 Type 2 diabetes mellitus without complications: Secondary | ICD-10-CM | POA: Diagnosis present

## 2021-05-31 DIAGNOSIS — F1010 Alcohol abuse, uncomplicated: Secondary | ICD-10-CM | POA: Diagnosis present

## 2021-05-31 DIAGNOSIS — Z833 Family history of diabetes mellitus: Secondary | ICD-10-CM | POA: Diagnosis not present

## 2021-05-31 DIAGNOSIS — I169 Hypertensive crisis, unspecified: Secondary | ICD-10-CM | POA: Diagnosis present

## 2021-05-31 DIAGNOSIS — M109 Gout, unspecified: Secondary | ICD-10-CM | POA: Diagnosis present

## 2021-05-31 DIAGNOSIS — E876 Hypokalemia: Secondary | ICD-10-CM | POA: Diagnosis present

## 2021-05-31 DIAGNOSIS — K219 Gastro-esophageal reflux disease without esophagitis: Secondary | ICD-10-CM | POA: Diagnosis present

## 2021-05-31 DIAGNOSIS — Z20822 Contact with and (suspected) exposure to covid-19: Secondary | ICD-10-CM | POA: Diagnosis present

## 2021-05-31 LAB — BASIC METABOLIC PANEL
Anion gap: 8 (ref 5–15)
BUN: 7 mg/dL (ref 6–20)
CO2: 30 mmol/L (ref 22–32)
Calcium: 8 mg/dL — ABNORMAL LOW (ref 8.9–10.3)
Chloride: 102 mmol/L (ref 98–111)
Creatinine, Ser: 0.68 mg/dL (ref 0.61–1.24)
GFR, Estimated: >60 mL/min (ref >60)
Glucose, Bld: 96 mg/dL (ref 70–99)
Potassium: 2.4 mmol/L — CL (ref 3.5–5.1)
Sodium: 140 mmol/L (ref 135–145)

## 2021-05-31 LAB — HIV ANTIBODY (ROUTINE TESTING W REFLEX): HIV Screen 4th Generation wRfx: NONREACTIVE

## 2021-05-31 LAB — MAGNESIUM: Magnesium: 1.5 mg/dL — ABNORMAL LOW (ref 1.7–2.4)

## 2021-05-31 MED ORDER — MAGNESIUM SULFATE 2 GM/50ML IV SOLN
2.0000 g | Freq: Once | INTRAVENOUS | Status: AC
  Administered 2021-05-31: 2 g via INTRAVENOUS
  Filled 2021-05-31: qty 50

## 2021-05-31 MED ORDER — POTASSIUM CHLORIDE CRYS ER 20 MEQ PO TBCR
40.0000 meq | EXTENDED_RELEASE_TABLET | ORAL | Status: AC
  Administered 2021-05-31 (×4): 40 meq via ORAL
  Filled 2021-05-31 (×4): qty 2

## 2021-05-31 MED ORDER — LOSARTAN POTASSIUM 50 MG PO TABS
25.0000 mg | ORAL_TABLET | Freq: Every day | ORAL | Status: DC
  Administered 2021-05-31 – 2021-06-01 (×2): 25 mg via ORAL
  Filled 2021-05-31 (×2): qty 1

## 2021-05-31 MED ORDER — VITAMIN D (ERGOCALCIFEROL) 1.25 MG (50000 UNIT) PO CAPS
50000.0000 [IU] | ORAL_CAPSULE | ORAL | Status: DC
  Administered 2021-05-31: 50000 [IU] via ORAL
  Filled 2021-05-31 (×2): qty 1

## 2021-05-31 NOTE — Assessment & Plan Note (Addendum)
-  Vit D level 16.15 -will start repletion with once a week high dose vit D. -d/c home with Drisdol

## 2021-05-31 NOTE — TOC Initial Note (Signed)
Transition of Care [facility]) - Initial/Assessment Note    Patient Details  Name: Trevor [name] MRN: [id number] Date of Birth: [birth year]  [facility] CM/SW Contact:    Salome Arnt, LCSW Phone Number: 05/31/2021, 9:30 AM  Clinical Narrative:  Pt admitted due to hypokalemia. Pt reports his brother lives with him. Pt is independent with ADLs and has transportation. TOC received consult for substance abuse. Pt somewhat irritable about questions as he states he has already answered all these before. He said he "drinks a little beer" and the doctor is "blowing this out of proportion." He declines any resources. Pt also said he will have insurance on March 3 and will plan to find PCP at that time.                  Expected Discharge Plan: Home/Self Care Barriers to Discharge: Continued Medical Work up   Patient Goals and CMS Choice Patient states their goals for this hospitalization and ongoing recovery are:: return home   Choice offered to / list presented to : Patient  Expected Discharge Plan and Services Expected Discharge Plan: Home/Self Care In-house Referral: Clinical Social Work     Living arrangements for the past 2 months: Apartment                                      Prior Living Arrangements/Services Living arrangements for the past 2 months: Apartment Lives with:: Siblings Patient language and need for interpreter reviewed:: Yes Do you feel safe going back to the place where you live?: Yes      Need for Family Participation in Patient Care: No (Comment)     Criminal Activity/Legal Involvement Pertinent to Current Situation/Hospitalization: No - Comment as needed  Activities of Daily Living Home Assistive Devices/Equipment: None ADL Screening (condition at time of admission) Patient's cognitive ability adequate to safely complete daily activities?: Yes Is the patient deaf or have difficulty hearing?: No Does the patient have difficulty  seeing, even when wearing glasses/contacts?: No Does the patient have difficulty concentrating, remembering, or making decisions?: No Patient able to express need for assistance with ADLs?: Yes Does the patient have difficulty dressing or bathing?: No Independently performs ADLs?: Yes (appropriate for developmental age) Does the patient have difficulty walking or climbing stairs?: No Weakness of Legs: None Weakness of Arms/Hands: None  Permission Sought/Granted                  Emotional Assessment     Affect (typically observed): Irritable Orientation: : Oriented to Self, Oriented to Place, Oriented to  Time, Oriented to Situation Alcohol / Substance Use: Alcohol Use Psych Involvement: No (comment)  Admission diagnosis:  Hypokalemia [E87.6] Patient Active Problem List   Diagnosis Date Noted   Vitamin D deficiency 05/31/2021   Hypokalemia 05/30/2021   HTN (hypertension) 05/30/2021   Tachycardia, unspecified 05/30/2021   Gout 05/30/2021   GERD (gastroesophageal reflux disease) 05/30/2021   Alcohol abuse 05/30/2021   Tobacco abuse 05/30/2021   PCP:  Patient, No Pcp Per (Inactive) Pharmacy:   [facility] [place] [place] - [address] AT [address] [place] [address] Phone: [phone] Fax: [phone]     Social Determinants of Health (SDOH) Interventions    Readmission Risk Interventions No flowsheet data found.

## 2021-05-31 NOTE — Assessment & Plan Note (Addendum)
-  In the setting of alcohol abuse and GI loss -given IV mag -d/c home with mag ox 400 mg bid

## 2021-05-31 NOTE — Progress Notes (Signed)
Progress Note   Patient: Lexx Monte EHM:[id number] DOB: 02-Nov-1970 DOA: 05/30/2021     0 DOS: the patient was seen and examined on 05/31/2021   Brief hospital admission narrative: Emori Kamau is a 51 y.o. male with medical history significant of patient have a prior history of prediabetes; not taking any medications for it.  Hypertension and gout; due to financial constraints and lack of insurance previously he is not taking any medications or following with any provider.  Patient expressed acquiring insurance and be eligible for care on March 3.  He is currently working.  Patient reported also history of tobacco and alcohol abuse. Patient has presented to the emergency department with complaints of bilateral feet numbness/tingling and pain.  The symptoms has been present for the last 2 days or so and worsening.  Patient has also expressed intermittent episodes of nausea/vomiting in the last 24 hours prior to admission with decrease in oral intake. Patient denies hematemesis, melena, hematochezia, dysuria, hematuria, chest pain, shortness of breath, focal weakness and abdominal pain.   In the ED: Work-up demonstrated hypertensive crisis, severe hypokalemia with a potassium of 2.2, B12 level of 313, alcohol 66, magnesium 1.4; chloride 94; normal WBCs, hemoglobin 11.2 and normal platelets.   Assessment and Plan: * Hypokalemia- (present on admission) -In the setting of alcohol abuse, decreased oral intake and GI losses prior to admission. -No further episode of nausea or vomiting -Patient reports that he is tolerating diet. -Potassium despite repletion is still 2.4 this morning. -Magnesium was also low. -Continue to replete electrolytes and follow trend. -Continue telemetry monitoring.    Hypomagnesemia -In the setting of alcohol abuse and GI loss -Will continue repletion and follow trend.  Vitamin D deficiency -Vit D level 16.15 -will start repletion with once a week high dose vit D.  Tobacco  abuse- (present on admission) -Cessation counseling provided -Nicotine patch has been ordered -Patient contemplating quitting.  Alcohol abuse- (present on admission) -Cessation counseling has been provided -At time of admission alcohol was 66 -Continue CIWA protocol -Continue thiamine and folic acid.  GERD (gastroesophageal reflux disease)- (present on admission) -Continue PPI. -Continue as needed antiemetics. -Lifestyle changes discussed with patient.  Gout- (present on admission) -Prior history of gout -No acute flare currently -Patient expressed that he is no longer taking allopurinol as previously prescribed, because he can not afford it. -Patient received education about increased risk for flares with consumption of alcohol, beer and certain foods. -Reported acquiring medical insurance in March; will like to have prescriptions at discharge to resewn when able to acquire medications. -Advised to keep himself well-hydrated.  Tachycardia, unspecified- (present on admission) -In the setting of alcohol abuse and electrolytes abnormalities. -Telemetry monitoring has been ordered. -Continue to replete electrolytes as needed and follow trend. -Alcohol cessation provided -Normal TSH. -Continue the use of labetalol.    HTN (hypertension)- (present on admission) -With hypertensive crisis at time of admission. -No signs of endorgan damage appreciated. -Blood pressure is better after initiation of labetalol; but is still above goal. -Will add losartan daily; which will also help with potassium sparing. -Alcohol cessation and heart healthy diet discussed with patient -Continue to follow vital signs.   Subjective:  Afebrile, no chest pain, no nausea, no vomiting.  Reports feeling better; still with mild discomfort of tingling/numbness in his feet.  Physical Exam: Vitals:   05/30/21 2127 05/31/21 0151 05/31/21 0514 05/31/21 1223  BP: (!) 159/89 (!) 155/93 (!) 175/98 (!) 145/97   Pulse: (!) 104 91 96 89  Resp: 19 19 19 18   Temp: 98.9 F (37.2 C) 98 F (36.7 C) (!) 97 F (36.1 C) 98.6 F (37 C)  TempSrc: Oral   Oral  SpO2: 100% 100% 100% 100%  Weight:      Height:       General exam: Alert, awake, oriented x 3, reports still mild tingling and numbness in his feet; no chest pain, no nausea, no vomiting. Respiratory system: Clear to auscultation. Respiratory effort normal.  Good saturation on room air. Cardiovascular system: Rate controlled no murmurs, rubs, gallops.  No JVD. Gastrointestinal system: Abdomen is nondistended, soft and nontender. No organomegaly or masses felt. Normal bowel sounds heard. Central nervous system: Alert and oriented. No focal neurological deficits. Extremities: No cyanosis or clubbing. Skin: No petechiae. Psychiatry: Judgement and insight appear normal. Mood & affect appropriate.    Data Reviewed: Potassium 2.4; magnesium 1.4. -Renal function has remained stable. Vitamin D 16.8 UDS negative.  Family Communication: No family at bedside.  Disposition: Status is: Inpatient Remains inpatient appropriate because: Still with ongoing electrolyte abnormalities.  Continue electrolyte repletion, follow trend and further adjust antihypertensive treatment.    Planned Discharge Destination: Home   Author: Barton Dubois, MD 05/31/2021 4:13 PM  For on call review [email or web address].

## 2021-06-01 LAB — BASIC METABOLIC PANEL
Anion gap: 6 (ref 5–15)
BUN: 8 mg/dL (ref 6–20)
CO2: 30 mmol/L (ref 22–32)
Calcium: 8.6 mg/dL — ABNORMAL LOW (ref 8.9–10.3)
Chloride: 104 mmol/L (ref 98–111)
Creatinine, Ser: 0.71 mg/dL (ref 0.61–1.24)
GFR, Estimated: >60 mL/min (ref >60)
Glucose, Bld: 97 mg/dL (ref 70–99)
Potassium: 3.7 mmol/L (ref 3.5–5.1)
Sodium: 140 mmol/L (ref 135–145)

## 2021-06-01 LAB — MAGNESIUM: Magnesium: 1.5 mg/dL — ABNORMAL LOW (ref 1.7–2.4)

## 2021-06-01 MED ORDER — MAGNESIUM OXIDE -MG SUPPLEMENT 400 (240 MG) MG PO TABS
400.0000 mg | ORAL_TABLET | Freq: Two times a day (BID) | ORAL | Status: DC
  Administered 2021-06-01: 400 mg via ORAL
  Filled 2021-06-01: qty 1

## 2021-06-01 MED ORDER — MAGNESIUM SULFATE 2 GM/50ML IV SOLN
2.0000 g | Freq: Once | INTRAVENOUS | Status: AC
  Administered 2021-06-01: 2 g via INTRAVENOUS
  Filled 2021-06-01: qty 50

## 2021-06-01 MED ORDER — AMLODIPINE BESYLATE 5 MG PO TABS: 5.0000 mg | ORAL_TABLET | Freq: Every day | ORAL | 1 refills | Status: DC

## 2021-06-01 MED ORDER — VITAMIN D (ERGOCALCIFEROL) 1.25 MG (50000 UNIT) PO CAPS: 50000.0000 [IU] | ORAL_CAPSULE | ORAL | 1 refills | Status: DC

## 2021-06-01 MED ORDER — MAGNESIUM OXIDE -MG SUPPLEMENT 400 (240 MG) MG PO TABS: 400.0000 mg | ORAL_TABLET | Freq: Two times a day (BID) | ORAL | 1 refills | Status: DC

## 2021-06-01 NOTE — Discharge Summary (Signed)
Physician Discharge Summary   Patient: Trevor [name] MRN: [id number] DOB: [birth year]  Admit date:     05/30/2021  Discharge date: 06/01/21  Discharge Physician: Shanon Brow Lavada Langsam   PCP: Patient, No Pcp Per (Inactive)   Recommendations at discharge:    Please repeat BMP, mag in 1 week    Hospital Course: Trevor [name] is a 51 y.o. male with medical history significant of patient have a prior history of prediabetes; not taking any medications for it.  Hypertension and gout; due to financial constraints and lack of insurance previously he is not taking any medications or following with any provider.  Patient expressed acquiring insurance and be eligible for care on March 3.  He is currently working.  Patient reported also history of tobacco and alcohol abuse. Patient has presented to the emergency department with complaints of bilateral feet numbness/tingling and pain.  The symptoms has been present for the last 2 days or so and worsening.  Patient has also expressed intermittent episodes of nausea/vomiting in the last 24 hours prior to admission with decrease in oral intake. Patient denies hematemesis, melena, hematochezia, dysuria, hematuria, chest pain, shortness of breath, focal weakness and abdominal pain.   In the ED: Work-up demonstrated hypertensive crisis, severe hypokalemia with a potassium of 2.2, B12 level of 313, alcohol 66, magnesium 1.4; chloride 94; normal WBCs, hemoglobin 11.2 and normal platelets.  Assessment and Plan: * Hypokalemia- (present on admission) -In the setting of alcohol abuse, decreased oral intake and GI losses prior to admission. -No further episode of nausea or vomiting -Patient reports that he is tolerating diet. -Magnesium was also low. -Continued to replete electrolytes and follow trend. -K = 3.7 on day of d/c   Hypomagnesemia -In the setting of alcohol abuse and GI loss -given IV mag -d/c home with mag ox 400 mg bid  Vitamin D deficiency -Vit D level 16.15 -will  start repletion with once a week high dose vit D. -d/c home with Drisdol  Tobacco abuse- (present on admission) -Cessation counseling provided -Nicotine patch has been ordered -Patient contemplating quitting.  Alcohol abuse- (present on admission) -Cessation counseling has been provided -At time of admission alcohol was 66 -Continue CIWA protocol -Continue thiamine and folic acid. -no signs of withdrawal  GERD (gastroesophageal reflux disease)- (present on admission) -Continue PPI. -Continue as needed antiemetics. -Lifestyle changes discussed with patient.  Gout- (present on admission) -Prior history of gout -No acute flare currently -Patient expressed that he is no longer taking allopurinol as previously prescribed, because he can not afford it. -Patient received education about increased risk for flares with consumption of alcohol, beer and certain foods. -Reported acquiring medical insurance in March; will like to have prescriptions at discharge to resewn when able to acquire medications. -Advised to keep himself well-hydrated.  Tachycardia, unspecified- (present on admission) -In the setting of alcohol abuse and electrolytes abnormalities. -Telemetry monitoring has been ordered. -Continue to replete electrolytes as needed and follow trend. -Alcohol cessation provided -Normal TSH. -improved with fluid resuscitation    HTN (hypertension)- (present on admission) -With hypertensive crisis at time of admission. -No signs of endorgan damage appreciated. D/c home with amlodipine Follow up PCP        Procedures performed: none Disposition: Home Diet recommendation:  Cardiac diet  DISCHARGE MEDICATION: Allergies as of 06/01/2021   No Known Allergies      Medication List     STOP taking these medications    allopurinol 300 MG tablet Commonly known as: ZYLOPRIM  colchicine 0.6 MG tablet   HYDROcodone-acetaminophen 5-325 MG tablet Commonly known as:  NORCO/VICODIN       TAKE these medications    amLODipine 5 MG tablet Commonly known as: NORVASC Take 1 tablet (5 mg total) by mouth daily.   magnesium oxide 400 (240 Mg) MG tablet Commonly known as: MAG-OX Take 1 tablet (400 mg total) by mouth 2 (two) times daily.   Vitamin D (Ergocalciferol) 1.25 MG (50000 UNIT) Caps capsule Commonly known as: DRISDOL Take 1 capsule (50,000 Units total) by mouth every 7 (seven) days. Start taking on: June 07, 2021         Discharge Exam: Danley Danker Weights   05/30/21 9562  Weight: 76.2 kg   HEENT-no icterus, [place]/AT CV-RRR Lungs--CTA, no wheeze Abd--soft/NT+BS Ext-no CCE  Condition at discharge: stable  The results of significant diagnostics from this hospitalization (including imaging, microbiology, ancillary and laboratory) are listed below for reference.   Imaging Studies: DG Chest Port 1 View  Result Date: 05/30/2021 CLINICAL DATA:  Weakness, bilateral foot swelling without chest pain or shortness of breath. EXAM: PORTABLE CHEST 1 VIEW COMPARISON:  None. FINDINGS: The heart size and mediastinal contours are within normal limits. Aortic atherosclerosis. Carotid artery calcifications. No focal airspace consolidation. No visible pleural effusion or pneumothorax. The visualized skeletal structures are unremarkable. IMPRESSION: No acute cardiopulmonary disease on this single view radiograph of the chest. Electronically Signed   By: Dahlia Bailiff M.D.   On: 05/30/2021 13:29    Microbiology: Results for orders placed or performed during the hospital encounter of 05/30/21  Resp Panel by RT-PCR (Flu A&B, Covid) Nasopharyngeal Swab     Status: None   Collection Time: 05/30/21  2:04 PM   Specimen: Nasopharyngeal Swab; Nasopharyngeal(NP) swabs in vial transport medium  Result Value Ref Range Status   SARS Coronavirus 2 by RT PCR NEGATIVE NEGATIVE Final    Comment: (NOTE) SARS-CoV-2 target nucleic acids are NOT DETECTED.  The SARS-CoV-2  RNA is generally detectable in upper respiratory specimens during the acute phase of infection. The lowest concentration of SARS-CoV-2 viral copies this assay can detect is 138 copies/mL. A negative result does not preclude SARS-Cov-2 infection and should not be used as the sole basis for treatment or other patient management decisions. A negative result may occur with  improper specimen collection/handling, submission of specimen other than nasopharyngeal swab, presence of viral mutation(s) within the areas targeted by this assay, and inadequate number of viral copies(<138 copies/mL). A negative result must be combined with clinical observations, patient history, and epidemiological information. The expected result is Negative.  Fact Sheet for Patients:  EntrepreneurPulse.com.au  Fact Sheet for Healthcare Providers:  IncredibleEmployment.be  This test is no t yet approved or cleared by the [place] FDA and  has been authorized for detection and/or diagnosis of SARS-CoV-2 by FDA under an Emergency Use Authorization (EUA). This EUA will remain  in effect (meaning this test can be used) for the duration of the COVID-19 declaration under Section 564(b)(1) of the Act, 21 U.S.C.section 360bbb-3(b)(1), unless the authorization is terminated  or revoked sooner.       Influenza A by PCR NEGATIVE NEGATIVE Final   Influenza B by PCR NEGATIVE NEGATIVE Final    Comment: (NOTE) The Xpert Xpress SARS-CoV-2/FLU/RSV plus assay is intended as an aid in the diagnosis of influenza from Nasopharyngeal swab specimens and should not be used as a sole basis for treatment. Nasal washings and aspirates are unacceptable for Xpert Xpress SARS-CoV-2/FLU/RSV testing.  Fact Sheet for Patients: EntrepreneurPulse.com.au  Fact Sheet for Healthcare Providers: IncredibleEmployment.be  This test is not yet approved or cleared by the  [place] FDA and has been authorized for detection and/or diagnosis of SARS-CoV-2 by FDA under an Emergency Use Authorization (EUA). This EUA will remain in effect (meaning this test can be used) for the duration of the COVID-19 declaration under Section 564(b)(1) of the Act, 21 U.S.C. section 360bbb-3(b)(1), unless the authorization is terminated or revoked.  Performed at [facility], [address] [place], [place] [address]     Labs: CBC: Recent Labs  Lab 05/30/21 1111  WBC 8.0  NEUTROABS 5.6  HGB 11.2*  HCT 33.6*  MCV 90.1  PLT 989   Basic Metabolic Panel: Recent Labs  Lab 05/30/21 1111 05/31/21 0532 06/01/21 0927  NA 139 140 140  K 2.2* 2.4* 3.7  CL 94* 102 104  CO2 32 30 30  GLUCOSE 95 96 97  BUN 5* 7 8  CREATININE 0.54* 0.68 0.71  CALCIUM 8.1* 8.0* 8.6*  MG 1.4* 1.5* 1.5*   Liver Function Tests: Recent Labs  Lab 05/30/21 1111  AST 41  ALT 16  ALKPHOS 86  BILITOT 0.6  PROT 6.4*  ALBUMIN 2.8*   CBG: No results for input(s): GLUCAP in the last 168 hours.  Discharge time spent: greater than 30 minutes.  Signed: Orson Eva, MD [facility] 06/01/2021

## 2021-07-15 ENCOUNTER — Telehealth: Payer: Self-pay | Admitting: Internal Medicine

## 2021-07-15 NOTE — Telephone Encounter (Signed)
Er referral  ?

## 2021-07-18 ENCOUNTER — Encounter: Payer: Self-pay | Admitting: Internal Medicine

## 2021-08-04 ENCOUNTER — Encounter: Payer: Self-pay | Admitting: Orthopaedic Surgery

## 2021-08-04 ENCOUNTER — Other Ambulatory Visit (HOSPITAL_COMMUNITY): Payer: Self-pay

## 2021-08-04 ENCOUNTER — Ambulatory Visit (INDEPENDENT_AMBULATORY_CARE_PROVIDER_SITE_OTHER): Payer: 59 | Admitting: Orthopaedic Surgery

## 2021-08-04 VITALS — BP 166/82 | HR 117 | Ht 66.0 in | Wt 135.8 lb

## 2021-08-04 DIAGNOSIS — M10071 Idiopathic gout, right ankle and foot: Secondary | ICD-10-CM

## 2021-08-04 DIAGNOSIS — F17210 Nicotine dependence, cigarettes, uncomplicated: Secondary | ICD-10-CM | POA: Diagnosis not present

## 2021-08-04 DIAGNOSIS — M10072 Idiopathic gout, left ankle and foot: Secondary | ICD-10-CM

## 2021-08-04 MED ORDER — ALLOPURINOL 300 MG PO TABS
300.0000 mg | ORAL_TABLET | Freq: Every day | ORAL | 5 refills | Status: AC
  Filled 2021-08-04: qty 30, 30d supply, fill #0

## 2021-08-04 NOTE — Progress Notes (Signed)
My foot pain is back.  I cannot afford my allopurinol. ? ?He has gout of the feet, more on the left.  He has not taken the allopurinol.  He says he cannot afford it.   ? ?He is a Furniture conservator/restorer and I told him to get medicine via Peri Maris with [employer] benefits.  He was unaware of the benefit.  I have explained some of his benefits.  He will contact HR and find out more.  I will call in medicine to [facility].  He is Patent attorney.  ? ?Left foot has pain of the great toe MTP joint and swelling, redness consistent with gout.  Right foot has some swelling but not as red.  NV intact. ? ?Encounter Diagnoses  ?Name Primary?  ? Acute idiopathic gout involving toe of right foot Yes  ? Nicotine dependence, cigarettes, uncomplicated   ? Acute idiopathic gout involving toe of left foot   ? ?Get the medicine.  Stay on it. ? ?I will see as needed secondary to his financial concerns.  Call and I will see in office. ? ?Call if any problem. ? ?Precautions discussed. ? ?Electronically Signed ?Sanjuana Kava, MD ?4/13/20238:39 AM ? ?

## 2021-08-04 NOTE — Patient Instructions (Addendum)
Out of work note for dates provided ? ? ?[place]: [phone] ?

## 2021-08-16 NOTE — H&P (View-Only) (Signed)
? ?Referring Provider: [facility] ?Primary Care Physician:  [name], [place] ?Primary Gastroenterologist:  Dr. Abbey Chatters ? ?Chief Complaint  ?Patient presents with  ? Dysphagia  ?  Food gets stuck in throat, vomiting    ? ? ?HPI:   ?Trevor [name] is a 51 y.o. male presenting today at the request of [facility] for GERD, but patient reported dysphagia. ? ?Today:  ?New onset dysphagia x2 months ago.  Symptoms initially started with solid items getting hung in the lower esophagus, but passing with drinking liquids.  However, over the last month to month and a half, he has been unable to tolerate any solid foods and he has to regurgitate.  Also unable to tolerate pills.  Only eating ice cream, Jell-O, Rama noodles, soup.  Reports he is lost about 30 pounds over the last couple of months. ? ?No history of heartburn, nausea, vomiting. ?Had been taking Excedrin every 6 hours for the last month due to gout.  He was a started on ibuprofen today per PCP. ? ?No brbpr or melena.  Stools have been a little harder since he has not been able to eat well. ? ?No prior EGD.  ? ?Last colonoscopy 5 years ago in [facility].  No polyps.  Moved here 2 to 3 years ago.  Works at [employer]. ? ? ?Past Medical History:  ?Diagnosis Date  ? Diabetes mellitus without complication ([place])   ? Hypertension   ? ? ?History reviewed. No pertinent surgical history. ? ?Current Outpatient Medications  ?Medication Sig Dispense Refill  ? allopurinol (ZYLOPRIM) 300 MG tablet Take 1 tablet (300 mg total) by mouth daily. 30 tablet 5  ? amLODipine (NORVASC) 5 MG tablet Take 1 tablet (5 mg total) by mouth daily. 30 tablet 1  ? ibuprofen (ADVIL) 600 MG tablet Take 1 tablet (600 mg total) by mouth every 8 (eight) hours as needed. 90 tablet 1  ? magnesium oxide (MAG-OX) 400 (240 Mg) MG tablet Take 1 tablet (400 mg total) by mouth 2 (two) times daily. 60 tablet 1  ? Vitamin D, Ergocalciferol, (DRISDOL) 1.25 MG (50000 UNIT) CAPS  capsule Take 1 capsule (50,000 Units total) by mouth every 7 (seven) days. 5 capsule 1  ? ?No current facility-administered medications for this visit.  ? ? ?Allergies as of 08/18/2021  ? (No Known Allergies)  ? ? ?Family History  ?Problem Relation Age of Onset  ? Cancer Father   ?     prostate  ? Cancer Sister   ? Colon cancer Neg Hx   ? Esophageal cancer Neg Hx   ? ? ?Social History  ? ?Socioeconomic History  ? Marital status: Single  ?  Spouse name: Not on file  ? Number of children: Not on file  ? Years of education: Not on file  ? Highest education level: Not on file  ?Occupational History  ? Not on file  ?Tobacco Use  ? Smoking status: Every Day  ?  Packs/day: 0.50  ?  Types: Cigarettes  ? Smokeless tobacco: Never  ?Substance and Sexual Activity  ? Alcohol use: Yes  ?  Comment: 2- 3 16 oz beer daily.  ? Drug use: Never  ? Sexual activity: Not on file  ?Other Topics Concern  ? Not on file  ?Social History Narrative  ? Not on file  ? ?Social Determinants of Health  ? ?Financial Resource Strain: Not on file  ?Food Insecurity: Not on file  ?  Transportation Needs: Not on file  ?Physical Activity: Not on file  ?Stress: Not on file  ?Social Connections: Not on file  ?Intimate Partner Violence: Not on file  ? ? ?Review of Systems: ?Gen: Denies any fever, chills, cold or flulike symptoms, presyncope, syncope. ?CV: Denies chest pain, heart palpitations. ?Resp: Denies shortness of breath or cough.  ?GI: See HPI ?GU : Denies urinary burning, urinary frequency, urinary hesitancy ?MS: Denies joint pain. ?Derm: Denies rash. ?Psych: Denies depression, anxiety. ?Heme: See HPI ? ?Physical Exam: ?BP 138/82   Pulse 100   Temp (!) 97.4 ?F (36.3 ?C) (Temporal)   Ht '5\' 6"'$  (1.676 m)   Wt 134 lb 3.2 oz (60.9 kg)   BMI 21.66 kg/m?  ?General:   Alert and oriented. Pleasant and cooperative. Well-nourished and well-developed.  ?Head:  Normocephalic and atraumatic. ?Eyes:  Without icterus, sclera clear and conjunctiva pink.  ?Ears:   Normal auditory acuity. ?Lungs:  Clear to auscultation bilaterally. No wheezes, rales, or rhonchi. No distress.  ?Heart:  S1, S2 present without murmurs appreciated.  ?Abdomen:  +BS, soft, non-tender and non-distended. No HSM noted. No guarding or rebound. No masses appreciated.  ?Rectal:  Deferred  ?Msk:  Symmetrical without gross deformities. Normal posture. ?Extremities:  Without edema. ?Neurologic:  Alert and  oriented x4;  grossly normal neurologically. ?Skin:  Intact without significant lesions or rashes. ?Psych:  Normal mood and affect. ? ? ? ?Assessment:  ?51 year old male with history of HTN and gout, presenting today for further evaluation of progressive dysphagia over the last 2 months.  Initially, symptoms started with solid food dysphagia with items passing with drinking liquids.  However, over the last 1-1.5 months, he has been unable to tolerate any solid foods or pills as they get hung and he has to regurgitate them.  He is tolerating a full liquid diet only.  Associated 30 pound weight loss.  Denies history of heartburn.  Denies BRBPR or melena.  No family history of esophageal cancer.  He had been taking Excedrin every 6 hours for the last month due to gout and was a started on ibuprofen today per PCP. ? ?With significant, progressive symptoms and associated weight loss, there is concern for underlying malignancy, but may also have esophageal web, ring, or stricture.  He needs an EGD ASAP for further evaluation and therapeutic intervention. ? ?We also discussed colon cancer screening.  He reports having a colonoscopy about 5 years ago in [facility] in [place] without polyps.  No family history of colon cancer. ? ? ?Plan:  ?Proceed with upper endoscopy with propofol by Dr. Abbey Chatters ASAP. The risks, benefits, and alternatives have been discussed with the patient in detail. The patient states understanding and desires to proceed. ?ASA 2 ?Full liquid diet.  Zofran  provided. ?Recommended 2 protein shakes daily to help maintain weight. ?ER precautions discussed. ?Request colonoscopy records for review. ?Follow-up after procedure. ? ? ?Aliene Altes, PA-C ?[facility] ?08/18/2021 ?  ?

## 2021-08-16 NOTE — Progress Notes (Signed)
? ?Referring Provider: [facility] ?Primary Care Physician:  [name], [place] ?Primary Gastroenterologist:  Dr. Abbey Chatters ? ?Chief Complaint  ?Patient presents with  ? Dysphagia  ?  Food gets stuck in throat, vomiting    ? ? ?HPI:   ?Trevor [name] is a 51 y.o. male presenting today at the request of [facility] for GERD, but patient reported dysphagia. ? ?Today:  ?New onset dysphagia x2 months ago.  Symptoms initially started with solid items getting hung in the lower esophagus, but passing with drinking liquids.  However, over the last month to month and a half, he has been unable to tolerate any solid foods and he has to regurgitate.  Also unable to tolerate pills.  Only eating ice cream, Jell-O, Rama noodles, soup.  Reports he is lost about 30 pounds over the last couple of months. ? ?No history of heartburn, nausea, vomiting. ?Had been taking Excedrin every 6 hours for the last month due to gout.  He was a started on ibuprofen today per PCP. ? ?No brbpr or melena.  Stools have been a little harder since he has not been able to eat well. ? ?No prior EGD.  ? ?Last colonoscopy 5 years ago in [facility].  No polyps.  Moved here 2 to 3 years ago.  Works at [employer]. ? ? ?Past Medical History:  ?Diagnosis Date  ? Diabetes mellitus without complication ([place])   ? Hypertension   ? ? ?History reviewed. No pertinent surgical history. ? ?Current Outpatient Medications  ?Medication Sig Dispense Refill  ? allopurinol (ZYLOPRIM) 300 MG tablet Take 1 tablet (300 mg total) by mouth daily. 30 tablet 5  ? amLODipine (NORVASC) 5 MG tablet Take 1 tablet (5 mg total) by mouth daily. 30 tablet 1  ? ibuprofen (ADVIL) 600 MG tablet Take 1 tablet (600 mg total) by mouth every 8 (eight) hours as needed. 90 tablet 1  ? magnesium oxide (MAG-OX) 400 (240 Mg) MG tablet Take 1 tablet (400 mg total) by mouth 2 (two) times daily. 60 tablet 1  ? Vitamin D, Ergocalciferol, (DRISDOL) 1.25 MG (50000 UNIT) CAPS  capsule Take 1 capsule (50,000 Units total) by mouth every 7 (seven) days. 5 capsule 1  ? ?No current facility-administered medications for this visit.  ? ? ?Allergies as of 08/18/2021  ? (No Known Allergies)  ? ? ?Family History  ?Problem Relation Age of Onset  ? Cancer Father   ?     prostate  ? Cancer Sister   ? Colon cancer Neg Hx   ? Esophageal cancer Neg Hx   ? ? ?Social History  ? ?Socioeconomic History  ? Marital status: Single  ?  Spouse name: Not on file  ? Number of children: Not on file  ? Years of education: Not on file  ? Highest education level: Not on file  ?Occupational History  ? Not on file  ?Tobacco Use  ? Smoking status: Every Day  ?  Packs/day: 0.50  ?  Types: Cigarettes  ? Smokeless tobacco: Never  ?Substance and Sexual Activity  ? Alcohol use: Yes  ?  Comment: 2- 3 16 oz beer daily.  ? Drug use: Never  ? Sexual activity: Not on file  ?Other Topics Concern  ? Not on file  ?Social History Narrative  ? Not on file  ? ?Social Determinants of Health  ? ?Financial Resource Strain: Not on file  ?Food Insecurity: Not on file  ?  Transportation Needs: Not on file  ?Physical Activity: Not on file  ?Stress: Not on file  ?Social Connections: Not on file  ?Intimate Partner Violence: Not on file  ? ? ?Review of Systems: ?Gen: Denies any fever, chills, cold or flulike symptoms, presyncope, syncope. ?CV: Denies chest pain, heart palpitations. ?Resp: Denies shortness of breath or cough.  ?GI: See HPI ?GU : Denies urinary burning, urinary frequency, urinary hesitancy ?MS: Denies joint pain. ?Derm: Denies rash. ?Psych: Denies depression, anxiety. ?Heme: See HPI ? ?Physical Exam: ?BP 138/82   Pulse 100   Temp (!) 97.4 ?F (36.3 ?C) (Temporal)   Ht '5\' 6"'$  (1.676 m)   Wt 134 lb 3.2 oz (60.9 kg)   BMI 21.66 kg/m?  ?General:   Alert and oriented. Pleasant and cooperative. Well-nourished and well-developed.  ?Head:  Normocephalic and atraumatic. ?Eyes:  Without icterus, sclera clear and conjunctiva pink.  ?Ears:   Normal auditory acuity. ?Lungs:  Clear to auscultation bilaterally. No wheezes, rales, or rhonchi. No distress.  ?Heart:  S1, S2 present without murmurs appreciated.  ?Abdomen:  +BS, soft, non-tender and non-distended. No HSM noted. No guarding or rebound. No masses appreciated.  ?Rectal:  Deferred  ?Msk:  Symmetrical without gross deformities. Normal posture. ?Extremities:  Without edema. ?Neurologic:  Alert and  oriented x4;  grossly normal neurologically. ?Skin:  Intact without significant lesions or rashes. ?Psych:  Normal mood and affect. ? ? ? ?Assessment:  ?51 year old male with history of HTN and gout, presenting today for further evaluation of progressive dysphagia over the last 2 months.  Initially, symptoms started with solid food dysphagia with items passing with drinking liquids.  However, over the last 1-1.5 months, he has been unable to tolerate any solid foods or pills as they get hung and he has to regurgitate them.  He is tolerating a full liquid diet only.  Associated 30 pound weight loss.  Denies history of heartburn.  Denies BRBPR or melena.  No family history of esophageal cancer.  He had been taking Excedrin every 6 hours for the last month due to gout and was a started on ibuprofen today per PCP. ? ?With significant, progressive symptoms and associated weight loss, there is concern for underlying malignancy, but may also have esophageal web, ring, or stricture.  He needs an EGD ASAP for further evaluation and therapeutic intervention. ? ?We also discussed colon cancer screening.  He reports having a colonoscopy about 5 years ago in [facility] in [place] without polyps.  No family history of colon cancer. ? ? ?Plan:  ?Proceed with upper endoscopy with propofol by Dr. Abbey Chatters ASAP. The risks, benefits, and alternatives have been discussed with the patient in detail. The patient states understanding and desires to proceed. ?ASA 2 ?Full liquid diet.  Zofran  provided. ?Recommended 2 protein shakes daily to help maintain weight. ?ER precautions discussed. ?Request colonoscopy records for review. ?Follow-up after procedure. ? ? ?Aliene Altes, PA-C ?[facility] ?08/18/2021 ?  ?

## 2021-08-17 ENCOUNTER — Telehealth: Payer: Self-pay | Admitting: Radiology

## 2021-08-17 NOTE — Telephone Encounter (Signed)
Patient called asking about FMLA papers, he thought we would have them.  I advised him he needs to get these from his employer and bring in or have them fax them to [place] to complete.  He understands, will come by tmrw.   ?

## 2021-08-18 ENCOUNTER — Other Ambulatory Visit (HOSPITAL_COMMUNITY): Payer: Self-pay

## 2021-08-18 ENCOUNTER — Ambulatory Visit (INDEPENDENT_AMBULATORY_CARE_PROVIDER_SITE_OTHER): Payer: 59 | Admitting: Gastroenterology

## 2021-08-18 ENCOUNTER — Encounter: Payer: Self-pay | Admitting: Family Medicine

## 2021-08-18 ENCOUNTER — Ambulatory Visit (INDEPENDENT_AMBULATORY_CARE_PROVIDER_SITE_OTHER): Payer: 59 | Admitting: Family Medicine

## 2021-08-18 ENCOUNTER — Encounter: Payer: Self-pay | Admitting: Gastroenterology

## 2021-08-18 VITALS — BP 144/90 | HR 101 | Ht 66.0 in | Wt 134.0 lb

## 2021-08-18 VITALS — BP 138/82 | HR 100 | Temp 97.4°F | Ht 66.0 in | Wt 134.2 lb

## 2021-08-18 DIAGNOSIS — R7301 Impaired fasting glucose: Secondary | ICD-10-CM

## 2021-08-18 DIAGNOSIS — Z1159 Encounter for screening for other viral diseases: Secondary | ICD-10-CM | POA: Diagnosis not present

## 2021-08-18 DIAGNOSIS — H539 Unspecified visual disturbance: Secondary | ICD-10-CM | POA: Diagnosis not present

## 2021-08-18 DIAGNOSIS — I10 Essential (primary) hypertension: Secondary | ICD-10-CM | POA: Diagnosis not present

## 2021-08-18 DIAGNOSIS — M109 Gout, unspecified: Secondary | ICD-10-CM

## 2021-08-18 DIAGNOSIS — F1010 Alcohol abuse, uncomplicated: Secondary | ICD-10-CM

## 2021-08-18 DIAGNOSIS — R1310 Dysphagia, unspecified: Secondary | ICD-10-CM | POA: Diagnosis not present

## 2021-08-18 DIAGNOSIS — E876 Hypokalemia: Secondary | ICD-10-CM

## 2021-08-18 DIAGNOSIS — Z720 Tobacco use: Secondary | ICD-10-CM

## 2021-08-18 MED ORDER — IBUPROFEN 600 MG PO TABS
600.0000 mg | ORAL_TABLET | Freq: Three times a day (TID) | ORAL | 1 refills | Status: DC | PRN
  Filled 2021-08-18: qty 90, 30d supply, fill #0

## 2021-08-18 NOTE — Patient Instructions (Signed)
We will arrange for you to have an upper endoscopy with Dr. Abbey Chatters on 08/26/21.  ? ?For now, continue to follow a full liquid diet. See separate handout.  ? ?Add 2 protein shakes daily.  These can be Ensure, boost, fairly protein, or other premade protein shakes, or you can buy a can of protein powder and make your own protein shakes. ? ?If you are unable to tolerate at least a full liquid diet, you need to proceed to the emergency room. ? ?We will follow-up with you in the office after your upper endoscopy. ? ?It was good to meet you today!  ? ?Aliene Altes, PA-C ?[facility] ? ?

## 2021-08-18 NOTE — Assessment & Plan Note (Signed)
-  Patient reports difficulty seeing close-up and hasn't had an eye examination for a year ?-Referral to the opthalmology ?

## 2021-08-18 NOTE — Assessment & Plan Note (Signed)
-  BP elevated today ?-Patient states that he has been unable to take his BP medications due to difficulty swallowing.  ?-Advised to get a pill crusher at his pharmacy ?

## 2021-08-18 NOTE — Assessment & Plan Note (Addendum)
-  Reports drinking 4 beers and martini  ?-Advised to limit drinks to 0-2 drinks a day  ?

## 2021-08-18 NOTE — Assessment & Plan Note (Signed)
-  Continue treatment regimen ?- Motrin ordered for pain relief ?-Educated on low purines food and decreasing alcohol consumption ?

## 2021-08-18 NOTE — Assessment & Plan Note (Signed)
Smokes about  9 cigarettes/day ? ?Asked about quitting: confirms that he/she currently smokes cigarettes ?Advise to quit smoking: Educated about QUITTING to reduce the risk of cancer, cardio and cerebrovascular disease. ?Assess willingness: Unwilling to quit at this time, but is working on cutting back. ?Assist with counseling and pharmacotherapy: Counseled for 5 minutes and literature provided. ?Arrange for follow up: follow up in 3 months and continue to offer help. ?

## 2021-08-18 NOTE — Assessment & Plan Note (Addendum)
Lab Results  ?Component Value Date  ? NA 140 06/01/2021  ? K 3.7 06/01/2021  ? CL 104 06/01/2021  ? CO2 30 06/01/2021  ?Resolved ?

## 2021-08-18 NOTE — Progress Notes (Addendum)
? ?New Patient Office Visit ? ?Subjective:  ?Patient ID: Trevor [name], male    DOB: [birth year]  Age: 51 y.o. MRN: [id number] ? ?CC:  ?Chief Complaint  ?Patient presents with  ? New Patient (Initial Visit)  ?  Pt states he has been losing weight in the last two months. Would like to discuss gout problem on both feet.   ? ? ?HPI ?Trevor [name] is a 51 y.o. male with past medical history of HTN, GERF, and dysphagia presents for establishing care. He reports being unable to eat solids since March 2023  due to difficulties swallowing. He says that food is getting stuck in his esophagus, and he has to cough it up. Because of his dysphagia, the patient has been unable to swallow his medicines. He reports not taking his BP medication because it would get stuck in his throat. He has only been eating liquids. He is following up with GI for this condition today, 08/18/21, at 11:00 am. 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 He still needs his exam examination and is interested in seeing an ophthalmologist. ? The patient reported pain and swelling in his right ankle and was diagnosed with Gout by his previous provider. He smokes nine cigarettes daily and states that a pack lasts two days. He drinks four beers and a martini daily.   ? 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 ? ? ?Past Medical History:  ?Diagnosis Date  ? Diabetes mellitus without complication ([place])   ? Hypertension   ? ? ?History reviewed. No pertinent surgical history. ? ?History reviewed. No pertinent family history. ? ?Social History  ? ?Socioeconomic History  ? Marital status: Single  ?  Spouse name: Not on file  ? Number of children: Not on file  ? Years of education: Not on file  ? Highest education level: Not on file  ?Occupational History  ? Not on file  ?Tobacco Use  ? Smoking status: Every Day  ?  Packs/day: 0.50  ?  Types: Cigarettes  ? Smokeless tobacco: Never  ?Substance and Sexual  Activity  ? Alcohol use: Yes  ?  Comment: occ use  ? Drug use: Not Currently  ? Sexual activity: Not on file  ?Other Topics Concern  ? Not on file  ?Social History Narrative  ? Not on file  ? ?Social Determinants of Health  ? ?Financial Resource Strain: Not on file  ?Food Insecurity: Not on file  ?Transportation Needs: Not on file  ?Physical Activity: Not on file  ?Stress: Not on file  ?Social Connections: Not on file  ?Intimate Partner Violence: Not on file  ? ? ?ROS ?Review of Systems  ?Constitutional:  Negative for chills, fatigue and fever.  ?HENT:  Negative for rhinorrhea, sinus pressure, sinus pain and sneezing.   ?Eyes:  Positive for visual disturbance. Negative for photophobia and pain.  ?Respiratory:  Negative for cough, chest tightness, shortness of breath and wheezing.   ?Cardiovascular:  Negative for chest pain, palpitations and leg swelling.  ?Gastrointestinal:  Negative for constipation, diarrhea, nausea and vomiting.  ?Endocrine: Negative for polydipsia, polyphagia and polyuria.  ?Genitourinary:  Negative for frequency and urgency.  ?Musculoskeletal:  Negative for joint swelling.  ?Skin:  Negative for rash and wound.  ?Neurological:  Negative for dizziness, weakness and headaches.  ?Psychiatric/Behavioral:  Negative for confusion, self-injury and suicidal ideas.   ? ?Objective:  ? ?Today's Vitals: BP (!) 144/90 (  BP Location: Left Arm, Patient Position: Sitting)   Pulse (!) 101   Ht '5\' 6"'$  (1.676 m)   Wt 134 lb (60.8 kg)   SpO2 100%   BMI 21.63 kg/m?  ? ?Physical Exam ?Constitutional:   ?   Appearance: Normal appearance.  ?HENT:  ?   Head: Normocephalic.  ?   Right Ear: External ear normal.  ?   Left Ear: External ear normal.  ?   Nose: No congestion or rhinorrhea.  ?   Mouth/Throat:  ?   Dentition: Dental caries present.  ?   Pharynx: No oropharyngeal exudate or posterior oropharyngeal erythema.  ?Eyes:  ?   General:     ?   Right eye: No discharge.     ?   Left eye: No discharge.  ?   Extraocular  Movements: Extraocular movements intact.  ?Cardiovascular:  ?   Rate and Rhythm: Normal rate and regular rhythm.  ?Pulmonary:  ?   Effort: Pulmonary effort is normal.  ?   Breath sounds: Normal breath sounds.  ?Abdominal:  ?   General: Bowel sounds are normal.  ?Genitourinary: ?   Comments: deferred ?Musculoskeletal:  ?   Right foot: Swelling and tenderness present. Normal pulse.  ?   Left foot: No swelling or tenderness. Normal pulse.  ?Skin: ?   General: Skin is warm.  ?   Capillary Refill: Capillary refill takes less than 2 seconds.  ?Neurological:  ?   Mental Status: He is alert and oriented to person, place, and time.  ?Psychiatric:  ?   Comments: Normal affect  ? ? ?Assessment & Plan:  ? ?Problem List Items Addressed This Visit   ? ?  ? Cardiovascular and Mediastinum  ? HTN (hypertension) (Chronic)  ?  -BP elevated today ?-Patient states that he has been unable to take his BP medications due to difficulty swallowing.  ?-Advised to get a pill crusher at his pharmacy ? ?  ?  ? Relevant Orders  ? Lipid Profile  ?  ? Other  ? Gout (Chronic)  ?  -Continue treatment regimen ?- Motrin ordered for pain relief ?-Educated on low purines food and decreasing alcohol consumption ? ?  ?  ? Relevant Medications  ? ibuprofen (ADVIL) 600 MG tablet  ? Alcohol abuse (Chronic)  ?  -Reports drinking 4 beers and martini  ?-Advised to limit drinks to 0-2 drinks a day  ? ?  ?  ? Tobacco abuse (Chronic)  ?  Smokes about  9 cigarettes/day ? ?Asked about quitting: confirms that he/she currently smokes cigarettes ?Advise to quit smoking: Educated about QUITTING to reduce the risk of cancer, cardio and cerebrovascular disease. ?Assess willingness: Unwilling to quit at this time, but is working on cutting back. ?Assist with counseling and pharmacotherapy: Counseled for 5 minutes and literature provided. ?Arrange for follow up: follow up in 3 months and continue to offer help. ?  ?  ? Hypokalemia  ?  Lab Results  ?Component Value Date  ? NA  140 06/01/2021  ? K 3.7 06/01/2021  ? CL 104 06/01/2021  ? CO2 30 06/01/2021  ?Resolved ?  ?  ? Visual disturbance  ?  -Patient reports difficulty seeing close-up and hasn't had an eye examination for a year ?-Referral to the opthalmology ? ?  ?  ? Relevant Orders  ? Ambulatory referral to Ophthalmology  ? ?Other Visit Diagnoses   ? ? IFG (impaired fasting glucose)    -  Primary  ?  Relevant Orders  ? Hemoglobin A1c  ? Need for hepatitis C screening test      ? Relevant Orders  ? Hepatitis C Antibody  ? ?  ? ? ?Outpatient Encounter Medications as of 08/18/2021  ?Medication Sig  ? allopurinol (ZYLOPRIM) 300 MG tablet Take 1 tablet (300 mg total) by mouth daily.  ? amLODipine (NORVASC) 5 MG tablet Take 1 tablet (5 mg total) by mouth daily.  ? ibuprofen (ADVIL) 600 MG tablet Take 1 tablet (600 mg total) by mouth every 8 (eight) hours as needed.  ? magnesium oxide (MAG-OX) 400 (240 Mg) MG tablet Take 1 tablet (400 mg total) by mouth 2 (two) times daily.  ? Vitamin D, Ergocalciferol, (DRISDOL) 1.25 MG (50000 UNIT) CAPS capsule Take 1 capsule (50,000 Units total) by mouth every 7 (seven) days.  ? ?No facility-administered encounter medications on file as of 08/18/2021.  ? ? ?Follow-up: Return in about 3 months (around 11/17/2021).  ? ?[name], [name] ?

## 2021-08-18 NOTE — Patient Instructions (Addendum)
I appreciate the opportunity to provide care to you today! ?  ?Follow up: 3 months ? ?Labs: lipid profile, HgA1c ? ?Referrals today-  ophthalmologist ?  ?Screening: hep C ? ?-Please pick up your ibuprofen for pain at your pharmacy ? ?It was a pleasure to see you and I look forward to continuing to work together on your health and well-being. ?Please do not hesitate to call the office if you need care or have questions about your care. ?  ?Have a wonderful day and week. ?With Gratitude, ?[name] MSN, [name]-BC  ?

## 2021-08-19 LAB — LIPID PANEL
Chol/HDL Ratio: 2.6 ratio (ref 0.0–5.0)
Cholesterol, Total: 157 mg/dL (ref 100–199)
HDL: 61 mg/dL (ref >39)
LDL Chol Calc (NIH): 83 mg/dL (ref 0–99)
Triglycerides: 66 mg/dL (ref 0–149)
VLDL Cholesterol Cal: 13 mg/dL (ref 5–40)

## 2021-08-19 LAB — HEPATITIS C ANTIBODY: Hep C Virus Ab: NONREACTIVE

## 2021-08-19 LAB — HEMOGLOBIN A1C
Est. average glucose Bld gHb Est-mCnc: 103 mg/dL
Hgb A1c MFr Bld: 5.2 % (ref 4.8–5.6)

## 2021-08-19 NOTE — Progress Notes (Signed)
Please inform the patient that his labs were within normal limits.

## 2021-08-22 ENCOUNTER — Telehealth: Payer: Self-pay | Admitting: *Deleted

## 2021-08-22 NOTE — Telephone Encounter (Signed)
I will ask pt when he comes in to pick up papers.  ?

## 2021-08-22 NOTE — Telephone Encounter (Signed)
Received FMLA papers for pt. Called pt and informed him that he was only seen in our office once. He stated he would come pick up papers and take them to his PCP. ?

## 2021-08-22 NOTE — Telephone Encounter (Signed)
Noted. Not sure if this was for his upcoming EGD. I know he was worried about missing a day of work. We can provide him with a note if needed.  ?

## 2021-08-24 ENCOUNTER — Telehealth: Payer: Self-pay

## 2021-08-24 ENCOUNTER — Telehealth: Payer: Self-pay | Admitting: *Deleted

## 2021-08-24 NOTE — Telephone Encounter (Signed)
FMLA forms ? ?Copied ?Noted ?Sleeved ? ?Fax # [phone] and patient to pick up a copy when ready ?

## 2021-08-24 NOTE — Telephone Encounter (Signed)
Called pt. He is aware new procedure time for procedure on Friday is 10:30am, arrival 9am. Aware he can have clear liquids until midnight and nothing after this point. ?

## 2021-08-24 NOTE — Telephone Encounter (Signed)
Ignore this, patient picked up ?

## 2021-08-24 NOTE — Telephone Encounter (Signed)
Patient need copy of lab work ?

## 2021-08-26 ENCOUNTER — Ambulatory Visit (HOSPITAL_COMMUNITY)
Admission: RE | Admit: 2021-08-26 | Discharge: 2021-08-26 | Disposition: A | Discharge status: Home / Self Care | Payer: 59 | Source: Ambulatory Visit | Attending: Internal Medicine | Admitting: Internal Medicine

## 2021-08-26 ENCOUNTER — Telehealth: Payer: Self-pay | Admitting: Internal Medicine

## 2021-08-26 ENCOUNTER — Ambulatory Visit (HOSPITAL_COMMUNITY): Payer: 59 | Admitting: Certified Registered"

## 2021-08-26 ENCOUNTER — Other Ambulatory Visit: Payer: Self-pay

## 2021-08-26 ENCOUNTER — Encounter (HOSPITAL_COMMUNITY)
Admission: RE | Disposition: A | Discharge status: Home / Self Care | Payer: Self-pay | Source: Ambulatory Visit | Attending: Internal Medicine

## 2021-08-26 ENCOUNTER — Ambulatory Visit (HOSPITAL_BASED_OUTPATIENT_CLINIC_OR_DEPARTMENT_OTHER): Payer: 59 | Admitting: Certified Registered"

## 2021-08-26 ENCOUNTER — Encounter (HOSPITAL_COMMUNITY): Payer: Self-pay

## 2021-08-26 DIAGNOSIS — E119 Type 2 diabetes mellitus without complications: Secondary | ICD-10-CM | POA: Diagnosis not present

## 2021-08-26 DIAGNOSIS — K219 Gastro-esophageal reflux disease without esophagitis: Secondary | ICD-10-CM | POA: Diagnosis not present

## 2021-08-26 DIAGNOSIS — F17210 Nicotine dependence, cigarettes, uncomplicated: Secondary | ICD-10-CM | POA: Diagnosis not present

## 2021-08-26 DIAGNOSIS — I10 Essential (primary) hypertension: Secondary | ICD-10-CM

## 2021-08-26 DIAGNOSIS — C155 Malignant neoplasm of lower third of esophagus: Secondary | ICD-10-CM | POA: Insufficient documentation

## 2021-08-26 DIAGNOSIS — R634 Abnormal weight loss: Secondary | ICD-10-CM | POA: Insufficient documentation

## 2021-08-26 DIAGNOSIS — R1310 Dysphagia, unspecified: Secondary | ICD-10-CM | POA: Diagnosis present

## 2021-08-26 DIAGNOSIS — D490 Neoplasm of unspecified behavior of digestive system: Secondary | ICD-10-CM

## 2021-08-26 DIAGNOSIS — K2289 Other specified disease of esophagus: Secondary | ICD-10-CM

## 2021-08-26 HISTORY — PX: BIOPSY: SHX5522

## 2021-08-26 HISTORY — PX: ESOPHAGOGASTRODUODENOSCOPY (EGD) WITH PROPOFOL: SHX5813

## 2021-08-26 SURGERY — ESOPHAGOGASTRODUODENOSCOPY (EGD) WITH PROPOFOL
Anesthesia: General

## 2021-08-26 MED ORDER — PROPOFOL 10 MG/ML IV BOLUS
INTRAVENOUS | Status: DC | PRN
Start: 2021-08-26 — End: 2021-08-26
  Administered 2021-08-26: 50 mg via INTRAVENOUS
  Administered 2021-08-26: 150 mg via INTRAVENOUS
  Administered 2021-08-26: 20 mg via INTRAVENOUS
  Administered 2021-08-26: 30 mg via INTRAVENOUS
  Administered 2021-08-26: 50 mg via INTRAVENOUS

## 2021-08-26 MED ORDER — LACTATED RINGERS IV SOLN: INTRAVENOUS | Status: DC

## 2021-08-26 MED ORDER — LIDOCAINE 2% (20 MG/ML) 5 ML SYRINGE
INTRAMUSCULAR | Status: DC | PRN
  Administered 2021-08-26: 50 mg via INTRAVENOUS

## 2021-08-26 NOTE — Interval H&P Note (Signed)
History and Physical Interval Note: ? ?08/26/2021 ?10:06 AM ? ?Trevor [name]  has presented today for surgery, with the diagnosis of esophageal dysphagia.  The various methods of treatment have been discussed with the patient and family. After consideration of risks, benefits and other options for treatment, the patient has consented to  Procedure(s) with comments: ?ESOPHAGOGASTRODUODENOSCOPY (EGD) WITH PROPOFOL (N/A) - 2:00pm ?BALLOON DILATION (N/A) as a surgical intervention.  The patient's history has been reviewed, patient examined, no change in status, stable for surgery.  I have reviewed the patient's chart and labs.  Questions were answered to the patient's satisfaction.   ? ? ?Trevor [name] ? ? ?

## 2021-08-26 NOTE — Anesthesia Postprocedure Evaluation (Signed)
Anesthesia Post Note ? ?Patient: Jewell Ryans ? ?Procedure(s) Performed: ESOPHAGOGASTRODUODENOSCOPY (EGD) WITH PROPOFOL ?BIOPSY ? ?Patient location during evaluation: Endoscopy ?Anesthesia Type: General ?Level of consciousness: awake and alert and oriented ?Pain management: pain level controlled ?Vital Signs Assessment: post-procedure vital signs reviewed and stable ?Respiratory status: spontaneous breathing, nonlabored ventilation and respiratory function stable ?Cardiovascular status: blood pressure returned to baseline and stable ?Postop Assessment: no apparent nausea or vomiting ?Anesthetic complications: no ? ? ?No notable events documented. ? ? ?Last Vitals:  ?Vitals:  ? 08/26/21 0921 08/26/21 1040  ?BP: (!) 169/102 (!) 137/97  ?Pulse:  (!) 114  ?Resp: 15 20  ?Temp: 36.5 ?C 36.5 ?C  ?SpO2: 100% 95%  ?  ?Last Pain:  ?Vitals:  ? 08/26/21 1040  ?TempSrc: Oral  ?PainSc: 0-No pain  ? ? ?  ?  ?  ?  ?  ?  ? ?Nadia Viar C Bronwyn Belasco ? ? ? ? ?

## 2021-08-26 NOTE — Transfer of Care (Signed)
Immediate Anesthesia Transfer of Care Note ? ?Patient: Trevor [name] ? ?Procedure(s) Performed: ESOPHAGOGASTRODUODENOSCOPY (EGD) WITH PROPOFOL ?BIOPSY ? ?Patient Location: Endoscopy Unit ? ?Anesthesia Type:MAC ? ?Level of Consciousness: sedated and patient cooperative ? ?Airway & Oxygen Therapy: Patient Spontanous Breathing ? ?Post-op Assessment: Report given to RN, Post -op Vital signs reviewed and stable and Patient moving all extremities ? ?Post vital signs: Reviewed and stable ? ?Last Vitals:  ?Vitals Value Taken Time  ?BP 137/97 08/26/21 1040  ?Temp 36.5 ?C 08/26/21 1040  ?Pulse 114 08/26/21 1040  ?Resp 20 08/26/21 1040  ?SpO2 95 % 08/26/21 1040  ? ? ?Last Pain:  ?Vitals:  ? 08/26/21 1040  ?TempSrc: Oral  ?PainSc:   ?   ? ?Patients Stated Pain Goal: 6 (08/26/21 9735) ? ?Complications: No notable events documented. ?

## 2021-08-26 NOTE — Addendum Note (Signed)
Addended by: Cheron Every on: 08/26/2021 11:22 AM ? ? Modules accepted: Orders ? ?

## 2021-08-26 NOTE — Discharge Instructions (Addendum)
EGD ?Discharge instructions ?Please read the instructions outlined below and refer to this sheet in the next few weeks. These discharge instructions provide you with general information on caring for yourself after you leave the hospital. Your doctor may also give you specific instructions. While your treatment has been planned according to the most current medical practices available, unavoidable complications occasionally occur. If you have any problems or questions after discharge, please call your doctor. ?ACTIVITY ?You may resume your regular activity but move at a slower pace for the next 24 hours.  ?Take frequent rest periods for the next 24 hours.  ?Walking will help expel (get rid of) the air and reduce the bloated feeling in your abdomen.  ?No driving for 24 hours (because of the anesthesia (medicine) used during the test).  ?You may shower.  ?Do not sign any important legal documents or operate any machinery for 24 hours (because of the anesthesia used during the test).  ?NUTRITION ?Drink plenty of fluids.  ?You may resume your normal diet.  ?Begin with a light meal and progress to your normal diet.  ?Avoid alcoholic beverages for 24 hours or as instructed by your caregiver.  ?MEDICATIONS ?You may resume your normal medications unless your caregiver tells you otherwise.  ?WHAT YOU CAN EXPECT TODAY ?You may experience abdominal discomfort such as a feeling of fullness or ?gas? pains.  ?FOLLOW-UP ?Your doctor will discuss the results of your test with you.  ?SEEK IMMEDIATE MEDICAL ATTENTION IF ANY OF THE FOLLOWING OCCUR: ?Excessive nausea (feeling sick to your stomach) and/or vomiting.  ?Severe abdominal pain and distention (swelling).  ?Trouble swallowing.  ?Temperature over 101? F (37.8? C).  ?Rectal bleeding or vomiting of blood.  ? ? 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  I took numerous biopsies of the area.  We should have  these results back by Monday or Tuesday.  I am going to order CAT scans of your chest abdomen pelvis to further evaluate.  We will also send an urgent referral to see Dr. Raliegh Ip of oncology.  Keep your diet full liquids, pudding, ice cream, etc.  Solid food will more than likely get stuck in this region.  I will be in touch next week when I have further results. ? ?I hope you have a great rest of your week! ? ?Elon Alas. Abbey Chatters, D.O. ?Gastroenterology and Hepatology ?[facility] ? ?

## 2021-08-26 NOTE — Telephone Encounter (Signed)
PA submitted to [employer] health plan for CT's. Will have to await approval/denial prior to scheduling. ?Referral sent to oncology  ?

## 2021-08-26 NOTE — Progress Notes (Signed)
Dr. Abbey Chatters in to talk to patient postoperatively. ?

## 2021-08-26 NOTE — Op Note (Signed)
[facility] ?Patient Name: Trevor [name] ?Procedure Date: 08/26/2021 10:09 AM ?MRN: [id number] ?Date of Birth: December 28, 1970 ?Attending MD: Elon Alas. Abbey Chatters , DO ?CSN: [id number] ?Age: 51 ?Admit Type: Outpatient ?Procedure:                Upper GI endoscopy ?Indications:              Dysphagia ?Providers:                Elon Alas. Abbey Chatters, DO, Charlsie Quest. Theda Sers RN, Therapist, sports,  ?                          Casimer Bilis, Technician ?Referring MD:              ?Medicines:                See the Anesthesia note for documentation of the  ?                          administered medications ?Complications:            No immediate complications. ?Estimated Blood Loss:     Estimated blood loss was minimal. ?Procedure:                Pre-Anesthesia Assessment: ?                          - The anesthesia plan was to use monitored  ?                          anesthesia care (MAC). ?                          After obtaining informed consent, the endoscope was  ?                          passed under direct vision. Throughout the  ?                          procedure, the patient's blood pressure, pulse, and  ?                          oxygen saturations were monitored continuously. The  ?                          GIF-H190 ([id number]) scope was introduced through the  ?                          mouth, with the intention of advancing to the  ?                          duodenum. The scope was advanced to the lower third  ?                          of the esophagus before the procedure was aborted.  ?                          Medications  were given. The upper GI endoscopy was  ?                          accomplished without difficulty. The patient  ?                          tolerated the procedure well. ?Scope In: 10:26:42 AM ?Scope Out: 10:37:56 AM ?Total Procedure Duration: 0 hours 11 minutes 14 seconds  ?Findings: ?     A large, fungating and ulcerating mass was found in the lower third of  ?     the esophagus, 30 cm from the incisors.  The mass was nearly obstructing  ?     and circumferential. I was unable to advance endosocpe past the lesion.  ?     Biopsies were taken with a cold forceps for histology. Procedure was  ?     then aborted. 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                          the esophagus. Biopsied. ?Moderate Sedation: ?     Per Anesthesia Care ?Recommendation:           - Patient has a contact number available for  ?                          emergencies. The signs and symptoms of potential  ?                          delayed complications were discussed with the  ?                          patient. Return to normal activities tomorrow.  ?                          Written discharge instructions were provided to the  ?                          patient. ?                          - Full liquid diet. ?                          - Will order CT chest/abd/pelvis for staging. ?                          - Urgent referral to oncology ?Procedure Code(s):        --- Professional --- ?                          67591, 52, Esophagogastroduodenoscopy, flexible,  ?  transoral; with biopsy, single or multiple ?Diagnosis Code(s):        --- Professional --- ?                          D49.0, Neoplasm of unspecified behavior of  ?                          digestive system ?                          R13.10, Dysphagia, unspecified ?CPT copyright 2019 American Medical Association. All rights reserved. ?The codes documented in this report are preliminary and upon coder review may  ?be revised to meet current compliance requirements. ?Elon Alas. Abbey Chatters, DO ?Elon Alas. [place], DO ?08/26/2021 10:48:17 AM ?This report has been signed electronically. ?Number of Addenda: 0 ?

## 2021-08-26 NOTE — Telephone Encounter (Signed)
I just finished EGD on this patient.  He has a large circumferential mass in the distal portion of esophagus concerning for esophageal malignancy. ? ?Can we order stat CT chest abdomen pelvis with contrast for staging purposes? ? ?Can we also place referral to Dr. Raliegh Ip of oncology. ? ?Sending to [place] as an [place].  Thank you ?

## 2021-08-26 NOTE — Anesthesia Preprocedure Evaluation (Addendum)
Anesthesia Evaluation  ?Patient identified by MRN, date of birth, ID band ?Patient awake ? ? ? ?Reviewed: ?Allergy & Precautions, NPO status , Patient's Chart, lab work & pertinent test results ? ?Airway ?Mallampati: II ? ?TM Distance: >3 FB ?Neck ROM: Full ? ? ? Dental ? ?(+) Dental Advisory Given, Poor Dentition, Missing, Loose,  ?  ?Pulmonary ?Current SmokerPatient did not abstain from smoking.,  ?  ?Pulmonary exam normal ?breath sounds clear to auscultation ? ? ? ? ? ? Cardiovascular ?hypertension, Pt. on medications ?Normal cardiovascular exam ?Rhythm:Regular Rate:Normal ? ? ?  ?Neuro/Psych ?negative neurological ROS ? negative psych ROS  ? GI/Hepatic ?GERD  Medicated and Poorly Controlled,(+)  ?  ? substance abuse ? alcohol use,   ?Endo/Other  ?diabetes, Well Controlled, Type 2 ? Renal/GU ?negative Renal ROS  ?negative genitourinary ?  ?Musculoskeletal ?negative musculoskeletal ROS ?(+)  ? Abdominal ?  ?Peds ?negative pediatric ROS ?(+)  Hematology ?negative hematology ROS ?(+)   ?Anesthesia Other Findings ? ? Reproductive/Obstetrics ?negative OB ROS ? ?  ? ? ? ? ? ? ? ? ? ? ? ? ? ?  ?  ? ? ? ? ? ? ? ?Anesthesia Physical ?Anesthesia Plan ? ?ASA: 3 ? ?Anesthesia Plan: General  ? ?Post-op Pain Management: Minimal or no pain anticipated  ? ?Induction: Intravenous ? ?PONV Risk Score and Plan: Propofol infusion ? ?Airway Management Planned: Nasal Cannula and Natural Airway ? ?Additional Equipment:  ? ?Intra-op Plan:  ? ?Post-operative Plan:  ? ?Informed Consent: I have reviewed the patients History and Physical, chart, labs and discussed the procedure including the risks, benefits and alternatives for the proposed anesthesia with the patient or authorized representative who has indicated his/her understanding and acceptance.  ? ? ? ? ? ?Plan Discussed with: CRNA and Surgeon ? ?Anesthesia Plan Comments:   ? ? ? ? ? ?Anesthesia Quick Evaluation ? ?

## 2021-08-28 NOTE — Telephone Encounter (Signed)
Noted  

## 2021-08-29 LAB — SURGICAL PATHOLOGY

## 2021-08-30 NOTE — Telephone Encounter (Signed)
LMOVM for pt to call back 

## 2021-08-30 NOTE — Telephone Encounter (Signed)
PA approved via [employer] health plan. AUTH# [id number], DOS 08/26/21-11/26/2021 ? ?Called pt, LMOVM to call back to see if pt is able to go for scan today ?

## 2021-08-31 ENCOUNTER — Telehealth: Payer: Self-pay | Admitting: Family Medicine

## 2021-08-31 ENCOUNTER — Telehealth: Payer: Self-pay | Admitting: Internal Medicine

## 2021-08-31 NOTE — Telephone Encounter (Signed)
Noted.Will do

## 2021-08-31 NOTE — Telephone Encounter (Signed)
Left a vm requesting a return call.  ?

## 2021-08-31 NOTE — Telephone Encounter (Signed)
I too have been unable to get a hold of patient.  I have tried calling multiple times in regards to his pathology results.  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  Hopefully he calls office soon.  Please keep trying to get a hold of him.  Thank you ?

## 2021-08-31 NOTE — Telephone Encounter (Signed)
LMOVM to call back for pt. FYI to Dr. Abbey Chatters I have been unable to reach pt. ?

## 2021-08-31 NOTE — Telephone Encounter (Signed)
Pt has been informed, he states he was told by his other specialist Gertie Fey) to come through his PCP to have forms completed.  ?

## 2021-08-31 NOTE — Telephone Encounter (Signed)
Called pt and also emrgency contact. LMOVM on both #'s ?

## 2021-09-01 ENCOUNTER — Encounter: Payer: Self-pay | Admitting: *Deleted

## 2021-09-01 NOTE — Telephone Encounter (Addendum)
Spoke with pt. He was not able to go for CT anytime before 5/18. That is his next day off. ?[facility] scheduling and CT scheduled for 5/18, arrival 10:00am, npo 4 hrs prior, p/u oral contrast from radiology ? ?Called pt and aware of CT appt details. He voiced understanding. ? ? ?Patient was also transferred to Dr. Abbey Chatters to discuss results. ?

## 2021-09-05 ENCOUNTER — Encounter (HOSPITAL_COMMUNITY): Payer: Self-pay | Admitting: Internal Medicine

## 2021-09-08 ENCOUNTER — Ambulatory Visit (HOSPITAL_COMMUNITY): Payer: 59 | Admitting: Hematology

## 2021-09-08 ENCOUNTER — Ambulatory Visit (HOSPITAL_COMMUNITY)
Admission: RE | Admit: 2021-09-08 | Discharge: 2021-09-08 | Disposition: A | Discharge status: Home / Self Care | Payer: 59 | Source: Ambulatory Visit | Attending: Internal Medicine | Admitting: Internal Medicine

## 2021-09-08 DIAGNOSIS — K2289 Other specified disease of esophagus: Secondary | ICD-10-CM | POA: Insufficient documentation

## 2021-09-08 MED ORDER — IOHEXOL 300 MG/ML  SOLN
100.0000 mL | Freq: Once | INTRAMUSCULAR | Status: AC | PRN
  Administered 2021-09-08: 100 mL via INTRAVENOUS

## 2021-09-13 ENCOUNTER — Inpatient Hospital Stay (HOSPITAL_COMMUNITY): Payer: 59 | Attending: Hematology | Admitting: Hematology

## 2021-09-13 ENCOUNTER — Inpatient Hospital Stay (HOSPITAL_COMMUNITY): Payer: 59

## 2021-09-13 DIAGNOSIS — C159 Malignant neoplasm of esophagus, unspecified: Secondary | ICD-10-CM

## 2021-09-13 DIAGNOSIS — C155 Malignant neoplasm of lower third of esophagus: Secondary | ICD-10-CM | POA: Insufficient documentation

## 2021-09-13 DIAGNOSIS — I10 Essential (primary) hypertension: Secondary | ICD-10-CM | POA: Insufficient documentation

## 2021-09-13 DIAGNOSIS — F17210 Nicotine dependence, cigarettes, uncomplicated: Secondary | ICD-10-CM | POA: Diagnosis not present

## 2021-09-13 DIAGNOSIS — Z8042 Family history of malignant neoplasm of prostate: Secondary | ICD-10-CM | POA: Insufficient documentation

## 2021-09-13 DIAGNOSIS — Z800 Family history of malignant neoplasm of digestive organs: Secondary | ICD-10-CM | POA: Insufficient documentation

## 2021-09-13 DIAGNOSIS — E119 Type 2 diabetes mellitus without complications: Secondary | ICD-10-CM | POA: Diagnosis not present

## 2021-09-13 LAB — COMPREHENSIVE METABOLIC PANEL
ALT: 7 U/L (ref 0–44)
AST: 11 U/L — ABNORMAL LOW (ref 15–41)
Albumin: 3.1 g/dL — ABNORMAL LOW (ref 3.5–5.0)
Alkaline Phosphatase: 58 U/L (ref 38–126)
Anion gap: 7 (ref 5–15)
BUN: 14 mg/dL (ref 6–20)
CO2: 29 mmol/L (ref 22–32)
Calcium: 9.4 mg/dL (ref 8.9–10.3)
Chloride: 103 mmol/L (ref 98–111)
Creatinine, Ser: 0.66 mg/dL (ref 0.61–1.24)
GFR, Estimated: >60 mL/min (ref >60)
Glucose, Bld: 92 mg/dL (ref 70–99)
Potassium: 3.2 mmol/L — ABNORMAL LOW (ref 3.5–5.1)
Sodium: 139 mmol/L (ref 135–145)
Total Bilirubin: 0.4 mg/dL (ref 0.3–1.2)
Total Protein: 6.9 g/dL (ref 6.5–8.1)

## 2021-09-13 LAB — CBC WITH DIFFERENTIAL/PLATELET
Abs Immature Granulocytes: 0.03 10*3/uL (ref 0.00–0.07)
Basophils Absolute: 0 10*3/uL (ref 0.0–0.1)
Basophils Relative: 0 %
Eosinophils Absolute: 0.1 10*3/uL (ref 0.0–0.5)
Eosinophils Relative: 1 %
HCT: 32.5 % — ABNORMAL LOW (ref 39.0–52.0)
Hemoglobin: 10.1 g/dL — ABNORMAL LOW (ref 13.0–17.0)
Immature Granulocytes: 0 %
Lymphocytes Relative: 14 %
Lymphs Abs: 1.4 10*3/uL (ref 0.7–4.0)
MCH: 23.5 pg — ABNORMAL LOW (ref 26.0–34.0)
MCHC: 31.1 g/dL (ref 30.0–36.0)
MCV: 75.6 fL — ABNORMAL LOW (ref 80.0–100.0)
Monocytes Absolute: 0.9 10*3/uL (ref 0.1–1.0)
Monocytes Relative: 9 %
Neutro Abs: 7.4 10*3/uL (ref 1.7–7.7)
Neutrophils Relative %: 76 %
Platelets: 342 10*3/uL (ref 150–400)
RBC: 4.3 MIL/uL (ref 4.22–5.81)
RDW: 18.3 % — ABNORMAL HIGH (ref 11.5–15.5)
WBC: 9.8 10*3/uL (ref 4.0–10.5)
nRBC: 0 % (ref 0.0–0.2)

## 2021-09-13 LAB — POCT I-STAT CREATININE: Creatinine, Ser: 0.6 mg/dL — ABNORMAL LOW (ref 0.61–1.24)

## 2021-09-13 NOTE — Patient Instructions (Signed)
Trevor [name] at [facility] Discharge Instructions  You were seen and examined today by Dr. Delton Coombes. Dr. Delton Coombes is a medical oncologist, meaning that he specializes in the treatment of cancer diagnoses. Dr. Delton Coombes discussed your past medical history, family history of cancers, and the events that led to you being here today.  You were referred to Dr. Delton Coombes by Dr. 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  The standard of treatment for this is concurrent chemotherapy and radiation followed by surgery.  Prior to that, you will have a PET scan. A PET scan is a specialized CT scan that illuminates where there is cancer present in the body. You will also need a Port-A-Cath placed to start treatment safely.   You will also be referred to Dr. Kipp Brood. He is a Psychologist, sport and exercise that would eventually do the surgery to remove surgery.  Dr. Delton Coombes will refer you to our nutritionist here at the [facility]. She will discuss the amount of calories needed to maintain your weight.  Follow-up as scheduled.   Thank you for choosing [facility] to provide your oncology and hematology care.  To afford each patient quality time with our provider, please arrive at least 15 minutes before your scheduled appointment time.   If you have a lab appointment with the [facility] please come in thru the Main Entrance and check in at the main information desk.  You need to re-schedule your appointment should you arrive 10 or more minutes late.  We strive to give you quality time with our providers, and arriving late affects you and other patients whose appointments are after yours.  Also, if you no show three or more times for appointments you may be dismissed from the clinic at the providers discretion.     Again, thank you for choosing [facility].  Our hope is that these requests will decrease the amount of time  that you wait before being seen by our physicians.       _____________________________________________________________  Should you have questions after your visit to [facility], please contact our office at [phone] and follow the prompts.  Our office hours are 8:00 a.m. and 4:30 p.m. Monday - Friday.  Please note that voicemails left after 4:00 p.m. may not be returned until the following business day.  We are closed weekends and major holidays.  You do have access to a nurse 24-7, just call the main number to the clinic [phone] and do not press any options, hold on the line and a nurse will answer the phone.    For prescription refill requests, have your pharmacy contact our office and allow 72 hours.    Due to Covid, you will need to wear a mask upon entering the hospital. If you do not have a mask, a mask will be given to you at the Main Entrance upon arrival. For doctor visits, patients may have 1 support person age 53 or older with them. For treatment visits, patients can not have anyone with them due to social distancing guidelines and our immunocompromised population.

## 2021-09-13 NOTE — Progress Notes (Signed)
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 Trevor [name] 51 y.o. male is here because of evaluation for esophageal mass, at the request of Dr. Abbey Chatters.  Today he reports feeling good, and he is accompanied by his brother. He has been unable to swallow solid foods starting 2 months ago. He reports having lost 34 lbs in the past 2 months. He is drinking blended food and broths. He drinks 3 Ensure daily. He reports pain when swallowing solid foods, but he denies pain wen swallowing liquids. He was diagnosed with Gout 1 month ago, and he reports associated ankle swellings and tingling/numbness in his feet. He denies tingling/numbness in his fingers. He denies history of CVA and MI. His energy is fair. He has a history of HTN. He reports 1 episode of vomiting blood a few days following his biopsy.   He lives on his own, and he works in Ambulance person at [employer]. He currently smokes 1/2 ppd and has smoked for 10 years. He reports previously drinking beer daily, but he has stopped drinking since his diagnosis. His dad had colon cancer, and his sister had stomach cancer.   MEDICAL HISTORY:  Past Medical History:  Diagnosis Date   Diabetes mellitus without complication ([place])    Hypertension     SURGICAL HISTORY: Past Surgical History:  Procedure Laterality Date   BIOPSY  08/26/2021   Procedure: BIOPSY;  Surgeon: Eloise Harman, DO;  Location: [facility];  Service: Endoscopy;;   ESOPHAGOGASTRODUODENOSCOPY (EGD) WITH PROPOFOL N/A 08/26/2021   Procedure: ESOPHAGOGASTRODUODENOSCOPY (EGD) WITH PROPOFOL;  Surgeon: Eloise Harman, DO;   Location: [facility];  Service: Endoscopy;  Laterality: N/A;  2:00pm    SOCIAL HISTORY: Social History   Socioeconomic History   Marital status: Single    Spouse name: Not on file   Number of children: Not on file   Years of education: Not on file   Highest education level: Not on file  Occupational History   Not on file  Tobacco Use   Smoking status: Every Day    Packs/day: 0.50    Types: Cigarettes   Smokeless tobacco: Never  Vaping Use   Vaping Use: Never used  Substance and Sexual Activity   Alcohol use: Yes    Alcohol/week: 14.0 standard drinks    Types: 14 Cans of beer per week    Comment: 2- 3 16 oz beer daily.   Drug use: Never   Sexual activity: Not on file  Other Topics Concern   Not on file  Social History Narrative   Not on file   Social Determinants of Health   Financial Resource Strain: Not on file  Food Insecurity: Not on file  Transportation Needs: Not on file  Physical Activity: Not on file  Stress: Not on file  Social Connections: Not on file  Intimate Partner Violence: Not on file    FAMILY HISTORY: Family History  Problem Relation Age of Onset   Cancer Father        prostate   Cancer Sister    Colon cancer Neg Hx    Esophageal cancer Neg Hx     ALLERGIES:  has No Known Allergies.  MEDICATIONS:  Current Outpatient Medications  Medication Sig Dispense Refill   allopurinol (ZYLOPRIM) 300 MG tablet Take 1 tablet (300 mg total) by mouth daily. 30 tablet 5   amLODipine (NORVASC) 5 MG tablet Take 1 tablet (5 mg total) by mouth daily. 30 tablet 1   ibuprofen (ADVIL) 600 MG tablet Take 1 tablet (600 mg total) by mouth every 8 (eight) hours as needed. 90 tablet 1   magnesium oxide (MAG-OX) 400 (240 Mg) MG tablet Take 1 tablet (400 mg total) by mouth 2 (two) times daily. 60 tablet 1   Vitamin D, Ergocalciferol, (DRISDOL) 1.25 MG (50000 UNIT) CAPS capsule Take 1 capsule (50,000 Units total) by mouth every 7 (seven) days. 5 capsule 1   No  current facility-administered medications for this visit.    REVIEW OF SYSTEMS:   Review of Systems  Constitutional:  Positive for unexpected weight change (-34 lbs). Negative for appetite change and fatigue.  HENT:   Positive for trouble swallowing.   Cardiovascular:  Positive for leg swelling (ankles - gout).  Gastrointestinal:  Positive for vomiting.  Neurological:  Positive for numbness (feet - gout).  Psychiatric/Behavioral:  Positive for depression. The patient is nervous/anxious.   All other systems reviewed and are negative.   PHYSICAL EXAMINATION: ECOG PERFORMANCE STATUS: 0 - Asymptomatic  Vitals:   09/13/21 0823  BP: (!) 168/91  Pulse: (!) 106  Resp: 18  Temp: 98.2 F (36.8 C)  SpO2: 100%   Filed Weights   09/13/21 0823  Weight: 132 lb 6.4 oz (60.1 kg)   Physical Exam Vitals reviewed.  Constitutional:      Appearance: Normal appearance.  Cardiovascular:     Rate and Rhythm: Normal rate and regular rhythm.     Pulses: Normal pulses.     Heart sounds: Normal heart sounds.  Pulmonary:     Effort: Pulmonary effort is normal.     Breath sounds: Normal breath sounds.  Abdominal:     Palpations: Abdomen is soft. There is no hepatomegaly, splenomegaly or mass.     Tenderness: There is no abdominal tenderness.  Musculoskeletal:     Right lower leg: No edema.     Left lower leg: No edema.  Lymphadenopathy:     Cervical: No cervical adenopathy.     Right cervical: No superficial, deep or posterior cervical adenopathy.    Left cervical: No superficial, deep or posterior cervical adenopathy.     Upper Body:     Right upper body: No supraclavicular or axillary adenopathy.     Left upper body: No supraclavicular or axillary adenopathy.     Lower Body: No right inguinal adenopathy. No left inguinal adenopathy.  Neurological:     General: No focal deficit present.     Mental Status: He is alert and oriented to person, place, and time.  Psychiatric:        Mood and  Affect: Mood normal.        Behavior: Behavior normal.     LABORATORY DATA:  I have reviewed the data as listed    Latest Ref Rng & Units 09/13/2021    9:34 AM 05/30/2021   11:11 AM  CBC  WBC 4.0 - 10.5 K/uL 9.8   8.0    Hemoglobin 13.0 - 17.0 g/dL 10.1   11.2    Hematocrit 39.0 - 52.0 % 32.5   33.6    Platelets 150 - 400 K/uL 342   336  Latest Ref Rng & Units 09/13/2021    9:34 AM 09/08/2021   10:47 AM 06/01/2021    9:27 AM  CMP  Glucose 70 - 99 mg/dL 92    97    BUN 6 - 20 mg/dL 14    8    Creatinine 0.61 - 1.24 mg/dL 0.66   0.60   0.71    Sodium 135 - 145 mmol/L 139    140    Potassium 3.5 - 5.1 mmol/L 3.2    3.7    Chloride 98 - 111 mmol/L 103    104    CO2 22 - 32 mmol/L 29    30    Calcium 8.9 - 10.3 mg/dL 9.4    8.6    Total Protein 6.5 - 8.1 g/dL 6.9      Total Bilirubin 0.3 - 1.2 mg/dL 0.4      Alkaline Phos 38 - 126 U/L 58      AST 15 - 41 U/L 11      ALT 0 - 44 U/L 7        RADIOGRAPHIC STUDIES: I have personally reviewed the radiological images as listed and agreed with the findings in the report. CT CHEST ABDOMEN PELVIS W CONTRAST  Result Date: 09/08/2021 CLINICAL DATA:  Esophageal mass on upper endoscopy. "STAT" staging CT scan of the chest, abdomen and pelvis ordered. EXAM: CT CHEST, ABDOMEN, AND PELVIS WITH CONTRAST TECHNIQUE: Multidetector CT imaging of the chest, abdomen and pelvis was performed following the standard protocol during bolus administration of intravenous contrast. RADIATION DOSE REDUCTION: This exam was performed according to the departmental dose-optimization program which includes automated exposure control, adjustment of the mA and/or kV according to patient size and/or use of iterative reconstruction technique. CONTRAST:  158m OMNIPAQUE IOHEXOL 300 MG/ML  SOLN COMPARISON:  None Available. FINDINGS: CT CHEST FINDINGS Cardiovascular: 2 vessel aortic arch. The right brachiocephalic and left common carotid artery share a common origin. No  evidence of aneurysm or dissection. The main pulmonary artery is normal in size. The heart is normal in size. No pericardial effusion. Mediastinum/Nodes: Normal thyroid gland. No mediastinal adenopathy. Irregular circumferential wall thickening in the mid and distal thoracic esophagus for a length of approximately 9 cm consistent with the clinical history of esophageal mass. Haziness in the periesophageal fat in the retroperitoneal space suggest transmural extension. Small 6 mm juxta esophageal lymph node (image 25 series 2). Lungs/Pleura: Small focus of ground-glass attenuation airspace opacities in a tree-in-bud configuration in the posterior aspect of the right upper lobe. Tiny 2 mm subpleural pulmonary nodule along the minor fissure (image 76 series 4). Focal smooth endobronchial nodule in the lower aspect of the left upper lobe (images 60 5-69 of series 4). There is associated narrowing of a segmental branch of the lingula. No pleural effusion or pneumothorax. Musculoskeletal: No acute fracture or aggressive appearing lytic or blastic osseous lesion. CT ABDOMEN PELVIS FINDINGS Hepatobiliary: No focal liver abnormality is seen. No gallstones, gallbladder wall thickening, or biliary dilatation. Pancreas: Unremarkable. No pancreatic ductal dilatation or surrounding inflammatory changes. Spleen: Normal in size without focal abnormality. Adrenals/Urinary Tract: Adrenal glands are unremarkable. Kidneys are normal, without renal calculi, focal lesion, or hydronephrosis. Bladder is unremarkable. Stomach/Bowel: Distension of the stomach and proximal duodenum without evidence of obstructing mass by CT imaging. This may represent recent ingestion of a meal. Vascular/Lymphatic: No significant vascular findings are present. No enlarged abdominal or pelvic lymph nodes. Reproductive: Prostate is unremarkable. Other: No abdominal wall  hernia or abnormality. No abdominopelvic ascites. Musculoskeletal: No acute fracture or  aggressive appearing lytic or blastic osseous lesion. Remote healed bilateral rib fractures. IMPRESSION: 1. Circumferential irregular wall thickening along a length of approximately 9 cm in the mid and distal thoracic esophagus consistent with the clinical history of esophageal neoplasm. Haziness in the periesophageal fat suggests transmural extension of tumor. Small adjacent 6 mm paraesophageal lymph node likely represents nodal metastasis. 2. Approximately 1.3 cm endobronchial versus Peri bronchial smooth nodule adjacent to a segmental bronchus within the lingula. Differential considerations include intrapulmonary lymph node, endobronchial neoplasm (carcinoid is a consideration), or less likely metastatic disease. PET-CT and/or bronchoscopy could further evaluate. 3. Focal patch of ground-glass attenuation airspace opacity in the posterior right upper lobe suggests an active infectious/inflammatory process such as early bronchopneumonia or small volume aspiration. 4. Marked distension of the stomach and proximal duodenum without evidence of obstructing mass. Findings may reflect a recent large meal. 5. Tiny 2 mm subpleural pulmonary nodule along the minor fissure is highly likely benign. Recommend attention on future follow-up imaging. 6. Remote bilateral healed rib fractures. Electronically Signed   By: Jacqulynn Cadet M.D.   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 - 34 pound weight loss in the last 2 months. - EGD (08/26/2021): A large fungating ulcerating mass found in the lower third of the esophagus, 30 cm from incisors.  Nearly obstructing and circumferential.  Scope could not be advanced. - Pathology: Invasive moderately to poorly differentiated squamous cell carcinoma. - CT CAP with contrast (09/08/2021): Circumferential irregular wall thickening along the length of approximately 9 cm in the mid and  distal thoracic esophagus.  Haziness in the periesophageal fat suggesting transmural extension of the tumor.  Small adjacent 6 mm paraesophageal lymph node.  1.3 cm endobronchial versus peribronchial smooth nodule adjacent to the segmental bronchus within the lingula.  Differential includes intrapulmonary lymph node/endobronchial neoplasm.   Social/family history: - He lives by himself.  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 - Current active smoker, half pack per day for 10 years.  He drinks beer daily. - Father had colon cancer and sister had stomach cancer.   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 - Recommend PET CT scan to further evaluate for metastatic disease and possible endobronchial lesion. - He might need a bronchoscopy based on PET scan findings. - Recommend consultation with Dr. Kipp Brood for surgical candidacy. - We discussed management with chemoradiation therapy with weekly carboplatin and paclitaxel if there is no metastatic disease. - If there is metastatic disease, will consider NGS testing for PD-L1, HER2 and MSI. - We also talked about need for port placement.  2.  Weight loss: - He is drinking about 3 ensures per day and chicken broth and pured foods. - He has reportedly gained about 3 to 4 pounds in the last few weeks. - We will make a dietary consultation. - We will obtain Dr. Abran Duke recommendation about considering enteric feeding tube.   All questions were answered. The patient knows to call the clinic with any problems, questions or concerns.   Derek Jack, MD, 09/13/21 5:42 PM  [place] [phone]   I, Thana Ates, am acting as a scribe for Dr. Derek Jack.  I, Derek Jack MD, have reviewed the above documentation for accuracy  and completeness, and I agree with the above.

## 2021-09-14 ENCOUNTER — Encounter (HOSPITAL_COMMUNITY): Payer: Self-pay | Admitting: Surgery

## 2021-09-14 NOTE — Progress Notes (Signed)
FMLA papers faxed to [employer] at [facility] [employer] on 09/14/21.

## 2021-09-22 ENCOUNTER — Ambulatory Visit (HOSPITAL_COMMUNITY)
Admission: RE | Admit: 2021-09-22 | Discharge: 2021-09-22 | Disposition: A | Discharge status: Home / Self Care | Payer: 59 | Source: Ambulatory Visit | Attending: Hematology | Admitting: Hematology

## 2021-09-22 DIAGNOSIS — C159 Malignant neoplasm of esophagus, unspecified: Secondary | ICD-10-CM | POA: Diagnosis not present

## 2021-09-22 MED ORDER — FLUDEOXYGLUCOSE F - 18 (FDG) INJECTION
6.8700 | Freq: Once | INTRAVENOUS | Status: AC | PRN
  Administered 2021-09-22: 6.87 via INTRAVENOUS

## 2021-09-26 ENCOUNTER — Inpatient Hospital Stay (HOSPITAL_COMMUNITY): Payer: 59 | Admitting: Dietician

## 2021-09-26 ENCOUNTER — Encounter (HOSPITAL_COMMUNITY): Payer: 59 | Admitting: Dietician

## 2021-09-26 ENCOUNTER — Encounter (HOSPITAL_COMMUNITY): Payer: Self-pay | Admitting: Hematology

## 2021-09-26 ENCOUNTER — Inpatient Hospital Stay (HOSPITAL_COMMUNITY): Payer: 59 | Attending: Hematology | Admitting: Hematology

## 2021-09-26 VITALS — BP 156/103 | HR 103 | Temp 97.7°F | Resp 18 | Ht 66.0 in | Wt 127.7 lb

## 2021-09-26 DIAGNOSIS — C155 Malignant neoplasm of lower third of esophagus: Secondary | ICD-10-CM | POA: Diagnosis not present

## 2021-09-26 DIAGNOSIS — C772 Secondary and unspecified malignant neoplasm of intra-abdominal lymph nodes: Secondary | ICD-10-CM | POA: Insufficient documentation

## 2021-09-26 DIAGNOSIS — R634 Abnormal weight loss: Secondary | ICD-10-CM | POA: Diagnosis not present

## 2021-09-26 DIAGNOSIS — F17210 Nicotine dependence, cigarettes, uncomplicated: Secondary | ICD-10-CM | POA: Diagnosis not present

## 2021-09-26 DIAGNOSIS — E119 Type 2 diabetes mellitus without complications: Secondary | ICD-10-CM | POA: Diagnosis not present

## 2021-09-26 DIAGNOSIS — Z8042 Family history of malignant neoplasm of prostate: Secondary | ICD-10-CM | POA: Diagnosis not present

## 2021-09-26 DIAGNOSIS — I119 Hypertensive heart disease without heart failure: Secondary | ICD-10-CM | POA: Insufficient documentation

## 2021-09-26 DIAGNOSIS — Z800 Family history of malignant neoplasm of digestive organs: Secondary | ICD-10-CM | POA: Diagnosis not present

## 2021-09-26 DIAGNOSIS — C159 Malignant neoplasm of esophagus, unspecified: Secondary | ICD-10-CM

## 2021-09-26 NOTE — Patient Instructions (Addendum)
[place] at [facility] Discharge Instructions  You were seen and examined today by Dr. Delton Coombes.  Dr. Delton Coombes discussed your PET scan which revealed Stage IV Esophageal Cancer. The cancer appears to have spread to your lymph nodes, lungs and various muscles. When cancer is Stage IV, that means that the cancer cells have learned how to travel to other areas of your body via your blood stream. This means that the cancer cannot be surgically removed or cured. The course of treatment chemotherapy.  You should continue as scheduled for your appointment with Dr. Kipp Brood. You should have a Port-A-Cath and a PEG tube (feeding tube) placed.   Dr. Delton Coombes has also recommended additional testing known as Next Generation Testing to see if there are additional treatment options.   Follow-up as scheduled.   Thank you for choosing [place] at [facility] to provide your oncology and hematology care.  To afford each patient quality time with our provider, please arrive at least 15 minutes before your scheduled appointment time.   If you have a lab appointment with the [facility] please come in thru the Main Entrance and check in at the main information desk.  You need to re-schedule your appointment should you arrive 10 or more minutes late.  We strive to give you quality time with our providers, and arriving late affects you and other patients whose appointments are after yours.  Also, if you no show three or more times for appointments you may be dismissed from the clinic at the providers discretion.     Again, thank you for choosing [facility].  Our hope is that these requests will decrease the amount of time that you wait before being seen by our physicians.       _____________________________________________________________  Should you have questions after your visit to [facility], please contact our office at  [phone] and follow the prompts.  Our office hours are 8:00 a.m. and 4:30 p.m. Monday - Friday.  Please note that voicemails left after 4:00 p.m. may not be returned until the following business day.  We are closed weekends and major holidays.  You do have access to a nurse 24-7, just call the main number to the clinic [phone] and do not press any options, hold on the line and a nurse will answer the phone.    For prescription refill requests, have your pharmacy contact our office and allow 72 hours.    Due to Covid, you will need to wear a mask upon entering the hospital. If you do not have a mask, a mask will be given to you at the Main Entrance upon arrival. For doctor visits, patients may have 1 support person age 53 or older with them. For treatment visits, patients can not have anyone with them due to social distancing guidelines and our immunocompromised population.

## 2021-09-26 NOTE — Progress Notes (Signed)
Nutrition Assessment   Reason for Assessment: Referral - Head&Neck   ASSESSMENT: 51 year old male with newly diagnosed stage IV esophageal cancer. Treatment plan currently under work-up.  Past medical history includes HTN, GERD, dysphagia, gout, alcohol abuse, tobacco abuse, vitamin D deficiency, visual disturbance.   Met with patient in office. He shares his frustration around learning his cancer is more advanced s/p recent imaging. Patient disappointed that he may no longer be a surgical candidate. Patient reports dysphagia and several choking episodes with solid foods that began ~3 months ago. Patient endorses liquid diet, recalls Ensure, Boost, chocolate milk, broths, juices, (beef/chicken), and some soups. Patient reports unable to tolerate tomato soup and some creamy based soups. He reports blending a whopper with beef broth last week. Patient is agreeable to having feeding tube placed, states "it will be like having 2 mouths." Patient is scheduled to meet with Dr. Arnoldo Morale tomorrow (6/6) for port/PEG consult.   Nutrition Focused Physical Exam:   Orbital Region: moderate  Buccal Region: moderate Upper Arm Region: mild  Thoracic and Lumbar Region: UTA Temple Region: moderate Clavicle Bone Region: mild Shoulder and Acromion Bone Region: mild Scapular Bone Region: UTA Dorsal Hand: mild Patellar Region: moderate  Anterior Thigh Region: moderate Posterior Calf Region: moderate Edema (RD assessment): none Hair: reviewed Eyes: reviewed Mouth: reviewed Skin: reviewed Nails: reviewed    Medications: zyloprim, norvasc, mag-ox, drisdol   Labs: 5/23 - K 3.2   Anthropometrics: Weights have decreased 24% (41 lbs) from usual weight in 4 months; significant   Height: 5' 6" Weight: 127 lb 11.2 oz UBW: 168 lb  (05/30/21) BMI: 21.37   Estimated Energy Needs  Kcals: 2020-2308 Protein: 98-110 Fluid: 2.3 L    MALNUTRITION DIAGNOSIS: Patient meets criteria for severe malnutrition  in the context of chronic illness (stage IV esophageal cancer) as evidenced by dysphagia, moderate fat and muscle depletion, 24% (41 lb) decrease from usual weight in 4 months which is significant    INTERVENTION:  Educated on importance of adequate calorie and protein energy intake to maintain strength/weights Educated on ways to alter texture of foods to desired consistency - handout provided Continue soft smooth foods as tolerated - high calorie shake recipes provided Drink 5-6 Ensure Plus/equivalent daily One complimentary case Ensure Plus High Protein provided  Will monitor for port/PEG placement. PEG care teaching completed today in clinic. Patient provided PEG kit (2 syringes, box of split guaze, 2 pack mesh briefs, tape. Will complete tube feeding with patient at f/u  Contact information provided   MONITORING, EVALUATION, GOAL: Patient will tolerate increased calories and protein to minimize further weight loss    Next Visit: Monday June 19 during infusion

## 2021-09-26 NOTE — Progress Notes (Unsigned)
Arne Cleveland, MD  Riley Lam Ok   [place] core soft tissue nodule  Prob approach L deltoid posterior -- see PET CT Im104 [address]   DDH

## 2021-09-26 NOTE — Progress Notes (Signed)
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    CANCER STAGING:  Cancer Staging  Squamous cell esophageal cancer ([place]) Staging form: Esophagus - Squamous Cell Carcinoma, AJCC 8th Edition - Clinical stage from 09/13/2021: Stage III (cT3, cN1, cM0, G3, L: Lower) - Unsigned   INTERVAL HISTORY:  Trevor [name], a 51 y.o. male, returns for routine follow-up of his squamous cell carcinoma of the distal esophagus. [name] was last seen on 09/13/2021.   Today he reports feeling good. He denies joint pains in his left shoulder and hip. He has lost 5 lbs since his last visit. He is consuming only liquids; he is drinking about 7 bottles of Boost or Ensure daily. He reports and injury to his left shoulder 10 years ago following which he reports he has had a knot on his left shoulder. He denies numbness/tingling in his hands, and his gout in his feet is stable.   REVIEW OF SYSTEMS:  Review of Systems  Constitutional:  Negative for appetite change and fatigue.  HENT:   Positive for trouble swallowing.   Musculoskeletal:  Negative for arthralgias.  Neurological:  Positive for numbness (gout).  All other systems reviewed and are negative.  PAST MEDICAL/SURGICAL HISTORY:  Past Medical History:  Diagnosis Date   Diabetes mellitus without complication ([place])    Hypertension    Past Surgical History:  Procedure Laterality Date   BIOPSY  08/26/2021   Procedure: BIOPSY;  Surgeon: Trevor Harman, DO;  Location: [facility];  Service: Endoscopy;;   ESOPHAGOGASTRODUODENOSCOPY (EGD) WITH PROPOFOL N/A 08/26/2021    Procedure: ESOPHAGOGASTRODUODENOSCOPY (EGD) WITH PROPOFOL;  Surgeon: Trevor Harman, DO;  Location: [facility];  Service: Endoscopy;  Laterality: N/A;  2:00pm    SOCIAL HISTORY:  Social History   Socioeconomic History   Marital status: Single    Spouse name: Not on file   Number of children: Not on file   Years of education: Not on file   Highest education level: Not on file  Occupational History   Not on file  Tobacco Use   Smoking status: Every Day    Packs/day: 0.50    Types: Cigarettes   Smokeless tobacco: Never  Vaping Use   Vaping Use: Never used  Substance and Sexual Activity   Alcohol use: Yes    Alcohol/week: 14.0 standard drinks    Types: 14 Cans of beer per week    Comment: 2- 3 16 oz beer daily.   Drug use: Never   Sexual activity: Not on file  Other Topics Concern   Not on file  Social History Narrative   Not on file   Social Determinants of Health   Financial Resource Strain: Not on file  Food Insecurity: Not on file  Transportation Needs: Not on file  Physical Activity: Not on file  Stress: Not on file  Social Connections: Not on file  Intimate Partner Violence: Not on file    FAMILY HISTORY:  Family History  Problem Relation Age of Onset   Cancer Father        prostate   Cancer  Sister    Colon cancer Neg Hx    Esophageal cancer Neg Hx     CURRENT MEDICATIONS:  Current Outpatient Medications  Medication Sig Dispense Refill   allopurinol (ZYLOPRIM) 300 MG tablet Take 1 tablet (300 mg total) by mouth daily. 30 tablet 5   amLODipine (NORVASC) 5 MG tablet Take 1 tablet (5 mg total) by mouth daily. 30 tablet 1   ibuprofen (ADVIL) 600 MG tablet Take 1 tablet (600 mg total) by mouth every 8 (eight) hours as needed. 90 tablet 1   magnesium oxide (MAG-OX) 400 (240 Mg) MG tablet Take 1 tablet (400 mg total) by mouth 2 (two) times daily. 60 tablet 1   Vitamin D, Ergocalciferol, (DRISDOL) 1.25 MG (50000 UNIT) CAPS capsule Take 1 capsule (50,000  Units total) by mouth every 7 (seven) days. 5 capsule 1   No current facility-administered medications for this visit.    ALLERGIES:  No Known Allergies  PHYSICAL EXAM:  Performance status (ECOG): 0 - Asymptomatic  Vitals:   09/26/21 1114  BP: (!) 156/103  Pulse: (!) 103  Resp: 18  Temp: 97.7 F (36.5 C)  SpO2: 99%   Wt Readings from Last 3 Encounters:  09/26/21 127 lb 11.2 oz (57.9 kg)  09/13/21 132 lb 6.4 oz (60.1 kg)  08/18/21 134 lb 3.2 oz (60.9 kg)   Physical Exam Vitals reviewed.  Constitutional:      Appearance: Normal appearance.  Cardiovascular:     Rate and Rhythm: Normal rate and regular rhythm.     Pulses: Normal pulses.     Heart sounds: Normal heart sounds.  Pulmonary:     Effort: Pulmonary effort is normal.     Breath sounds: Normal breath sounds.  Neurological:     General: No focal deficit present.     Mental Status: He is alert and oriented to person, place, and time.  Psychiatric:        Mood and Affect: Mood normal.        Behavior: Behavior normal.     LABORATORY DATA:  I have reviewed the labs as listed.     Latest Ref Rng & Units 09/13/2021    9:34 AM 05/30/2021   11:11 AM  CBC  WBC 4.0 - 10.5 K/uL 9.8   8.0    Hemoglobin 13.0 - 17.0 g/dL 10.1   11.2    Hematocrit 39.0 - 52.0 % 32.5   33.6    Platelets 150 - 400 K/uL 342   336        Latest Ref Rng & Units 09/13/2021    9:34 AM 09/08/2021   10:47 AM 06/01/2021    9:27 AM  CMP  Glucose 70 - 99 mg/dL 92    97    BUN 6 - 20 mg/dL 14    8    Creatinine 0.61 - 1.24 mg/dL 0.66   0.60   0.71    Sodium 135 - 145 mmol/L 139    140    Potassium 3.5 - 5.1 mmol/L 3.2    3.7    Chloride 98 - 111 mmol/L 103    104    CO2 22 - 32 mmol/L 29    30    Calcium 8.9 - 10.3 mg/dL 9.4    8.6    Total Protein 6.5 - 8.1 g/dL 6.9      Total Bilirubin 0.3 - 1.2 mg/dL 0.4      Alkaline Phos 38 - 126 U/L 58  AST 15 - 41 U/L 11      ALT 0 - 44 U/L 7        DIAGNOSTIC IMAGING:  I have independently  reviewed the scans and discussed with the patient. CT CHEST ABDOMEN PELVIS W CONTRAST  Result Date: 09/08/2021 CLINICAL DATA:  Esophageal mass on upper endoscopy. "STAT" staging CT scan of the chest, abdomen and pelvis ordered. EXAM: CT CHEST, ABDOMEN, AND PELVIS WITH CONTRAST TECHNIQUE: Multidetector CT imaging of the chest, abdomen and pelvis was performed following the standard protocol during bolus administration of intravenous contrast. RADIATION DOSE REDUCTION: This exam was performed according to the departmental dose-optimization program which includes automated exposure control, adjustment of the mA and/or kV according to patient size and/or use of iterative reconstruction technique. CONTRAST:  148m OMNIPAQUE IOHEXOL 300 MG/ML  SOLN COMPARISON:  None Available. FINDINGS: CT CHEST FINDINGS Cardiovascular: 2 vessel aortic arch. The right brachiocephalic and left common carotid artery share a common origin. No evidence of aneurysm or dissection. The main pulmonary artery is normal in size. The heart is normal in size. No pericardial effusion. Mediastinum/Nodes: Normal thyroid gland. No mediastinal adenopathy. Irregular circumferential wall thickening in the mid and distal thoracic esophagus for a length of approximately 9 cm consistent with the clinical history of esophageal mass. Haziness in the periesophageal fat in the retroperitoneal space suggest transmural extension. Small 6 mm juxta esophageal lymph node (image 25 series 2). Lungs/Pleura: Small focus of ground-glass attenuation airspace opacities in a tree-in-bud configuration in the posterior aspect of the right upper lobe. Tiny 2 mm subpleural pulmonary nodule along the minor fissure (image 76 series 4). Focal smooth endobronchial nodule in the lower aspect of the left upper lobe (images 60 5-69 of series 4). There is associated narrowing of a segmental branch of the lingula. No pleural effusion or pneumothorax. Musculoskeletal: No acute fracture  or aggressive appearing lytic or blastic osseous lesion. CT ABDOMEN PELVIS FINDINGS Hepatobiliary: No focal liver abnormality is seen. No gallstones, gallbladder wall thickening, or biliary dilatation. Pancreas: Unremarkable. No pancreatic ductal dilatation or surrounding inflammatory changes. Spleen: Normal in size without focal abnormality. Adrenals/Urinary Tract: Adrenal glands are unremarkable. Kidneys are normal, without renal calculi, focal lesion, or hydronephrosis. Bladder is unremarkable. Stomach/Bowel: Distension of the stomach and proximal duodenum without evidence of obstructing mass by CT imaging. This may represent recent ingestion of a meal. Vascular/Lymphatic: No significant vascular findings are present. No enlarged abdominal or pelvic lymph nodes. Reproductive: Prostate is unremarkable. Other: No abdominal wall hernia or abnormality. No abdominopelvic ascites. Musculoskeletal: No acute fracture or aggressive appearing lytic or blastic osseous lesion. Remote healed bilateral rib fractures. IMPRESSION: 1. Circumferential irregular wall thickening along a length of approximately 9 cm in the mid and distal thoracic esophagus consistent with the clinical history of esophageal neoplasm. Haziness in the periesophageal fat suggests transmural extension of tumor. Small adjacent 6 mm paraesophageal lymph node likely represents nodal metastasis. 2. Approximately 1.3 cm endobronchial versus Peri bronchial smooth nodule adjacent to a segmental bronchus within the lingula. Differential considerations include intrapulmonary lymph node, endobronchial neoplasm (carcinoid is a consideration), or less likely metastatic disease. PET-CT and/or bronchoscopy could further evaluate. 3. Focal patch of ground-glass attenuation airspace opacity in the posterior right upper lobe suggests an active infectious/inflammatory process such as early bronchopneumonia or small volume aspiration. 4. Marked distension of the stomach and  proximal duodenum without evidence of obstructing mass. Findings may reflect a recent large meal. 5. Tiny 2 mm subpleural pulmonary nodule along the  minor fissure is highly likely benign. Recommend attention on future follow-up imaging. 6. Remote bilateral healed rib fractures. Electronically Signed   By: Jacqulynn Cadet M.D.   On: 09/08/2021 11:56   NM PET Image Initial (PI) Skull Base To Thigh (F-18 FDG)  Result Date: 09/23/2021 CLINICAL DATA:  Initial treatment strategy for esophageal cancer. EXAM: NUCLEAR MEDICINE PET SKULL BASE TO THIGH TECHNIQUE: 6.87 mCi F-18 FDG was injected intravenously. Full-ring PET imaging was performed from the skull base to thigh after the radiotracer. CT data was obtained and used for attenuation correction and anatomic localization. Fasting blood glucose: 97 mg/dl COMPARISON:  None available FINDINGS: Mediastinal blood pool activity: SUV max 1.87 Liver activity: SUV max NA NECK: No hypermetabolic lymph nodes in the neck. Incidental CT findings: none CHEST: Long segment thickening of the esophagus with marked increased metabolic activity extending to the level of the GE junction involving approximately 1/3 to 1/2 the length of the esophagus with a maximum SUV of 14.49. LEFT juxta hilar nodule (image 97/3) 1 cm with a maximum SUV of 7.26. Incidental CT findings: Small bilateral pleural effusions. Airways are patent. Proximal esophagus is patulous implying some level of obstruction in the esophagus due to this off a G ule mass. Calcified aortic atherosclerosis. Calcified coronary artery disease. Normal to slightly enlarged heart size without pericardial effusion or nodularity. No adenopathy by size criteria in the chest. ABDOMEN/PELVIS: Gastrohepatic adenopathy (image 134/3) these are immediately anterior to the diaphragmatic crura, just to the LEFT and RIGHT of the midline. One measuring 17 mm short axis the other 16 mm. Maximum SUV in this area 7.01. No signs of solid organ  involvement at this time. No additional adenopathy in the abdomen or in the pelvis. Incidental CT findings: No acute findings relative to liver, gallbladder, pancreas, spleen, adrenal glands or kidneys urinary bladder is decompressed. Small volume free fluid in the pelvis is nonspecific. Liver contour suggests underlying liver disease/cirrhosis. No acute findings relative to stomach, small or large bowel. SKELETON: No discrete skeletal lesion. LEFT deltoid uptake (image 68/3) small lesion in this area measuring 13 mm with a maximum SUV of 6.98 LEFT erector spine a musculature ir (image 166/3) adjacent to the L3 transverse process on the LEFT measuring 18 mm with a maximum SUV of 8.41. (Image 223/3) LEFT gluteal activity with very subtle hypodense area measuring perhaps 8 mm with a maximum SUV of 2.97 Incidental CT findings: Spinal degenerative changes and evidence of prior trauma to the bilateral ribs, chronic, healed rib fractures. IMPRESSION: Positive for large esophageal mass involving 1/2 to 1/3 of the esophagus along its distal aspect and associated with marked increased metabolic activity, upper abdominal nodal metastases and pulmonary metastatic disease. Intramuscular lesions, while atypical are highly suspicious for if not diagnostic of metastatic lesions in the setting of esophageal neoplasm. Most accessible areas which could be considered for biopsy are in the LEFT erector spinae musculature and in the area of the LEFT deltoid muscle. Small bilateral pleural effusions, aortic atherosclerosis, coronary artery disease and signs of cirrhosis with small volume ascites as described. Electronically Signed   By: Zetta Bills M.D.   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 - 34 pound weight loss in the last 2 months. - EGD (08/26/2021): A large fungating ulcerating mass found in the lower third of the  esophagus, 30 cm from incisors.  Nearly obstructing and circumferential.  Scope could not be advanced. - Pathology: Invasive moderately to poorly differentiated squamous cell carcinoma. - CT CAP with contrast (09/08/2021): Circumferential irregular wall thickening along the length of approximately 9 cm in the mid and distal thoracic esophagus.  Haziness in the periesophageal fat suggesting transmural extension of the tumor.  Small adjacent 6 mm paraesophageal lymph node.  1.3 cm endobronchial versus peribronchial smooth nodule adjacent to the segmental bronchus within the lingula.  Differential includes intrapulmonary lymph node/endobronchial neoplasm. - PET scan (09/22/2021): Left lung nodule PET positive.  Left deltoid uptake SUV 6.9, left erector spine musculature adjacent to L3 transverse process SUV 8.4, left gluteal activity with SUV 2.97.  Positive for large esophageal mass involving half to one third of the distal esophagus with upper abdominal nodal metastasis.    Social/family history: - He lives by himself.  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 - Current active smoker, half pack per day for 10 years.  He drinks beer daily. - Father had colon cancer and sister had stomach cancer.   PLAN:  Stage IV (T3N1 M1 G3) lower esophageal squamous cell carcinoma: - I have discussed the PET CT scan findings with the patient in detail. - Unfortunately he was found to have findings consistent with metastatic disease on the PET scan which were not seen on the prior CT scan. - I have recommended [facility] NGS testing for PD-L1, HER2, MSI,CLDN-18.2. - I have recommended port placement for systemic chemotherapy. - We will most likely use FOLFOX with immunotherapy irrespective of PD-L1 status as immunotherapy is more likely to work in squamous cell histology.  If tumor is HER2 positive, we will add Herceptin. - He was upset due to upstaging on a PET scan.  I have offered him second opinion consultation at a tertiary  center. - He has an appointment to see Dr. Kipp Brood on Friday which he will keep. - Due to unusual location of the metastatic disease, I have also recommended biopsy of the left deltoid mass/erector lesion.  2.  Weight loss: - He is drinking up to 7 cans of Ensure and boost per day.  He is also drinking V8. - He has lost about 5 pounds in the last 2 weeks. - I have recommended at least upwards of 2000 cal/day intake. - We have made a referral to our dietitian.  He is seeing her later today.   Orders placed this encounter:  Orders Placed This Encounter  Procedures   CT BIOPSY     Derek Jack, MD [facility] [phone]   I, Thana Ates, am acting as a scribe for Dr. Derek Jack.  I, Derek Jack MD, have reviewed the above documentation for accuracy and completeness, and I agree with the above.

## 2021-09-26 NOTE — Progress Notes (Signed)
[address]       Trevor [name],Trevor [name] [id number]             [phone]                    [name] [place] Medical Record #[id number] Date of Birth: [birth year]  Referring: Derek Jack, MD Primary Care: [name], [place] Primary Cardiologist: None  Chief Complaint:    Chief Complaint  Patient presents with   Esophageal Lesion,mass    Surgical consult, PET [facility] 09/22/21, C/A/P CT 09/08/21/upper Endo 08/26/21    History of Present Illness:    Trevor [name] 51 y.o. male referred for surgical evaluation of stage IV esophageal cancer.  Over the past 2 months he has had a 34 pound weight loss, along with dysphagia and odynophagia.  He subsequently underwent an EGD which identified a mid to lower esophageal mass.  Biopsies were consistent with squamous cell cancer.  He currently is only able to tolerate liquids.  He underwent a PET/CT which showed avidity in the esophageal mass, and concern for metastasis to the lung, left shoulder, and left back muscle.     Zubrod Score: At the time of surgery this patient's most appropriate activity status/level should be described as: '[x]'$     0    Normal activity, no symptoms '[]'$     1    Restricted in physical strenuous activity but ambulatory, able to do out light work '[]'$     2    Ambulatory and capable of self care, unable to do work activities, up and about               >50 % of waking hours                              '[]'$     3    Only limited self care, in bed greater than 50% of waking hours '[]'$     4    Completely disabled, no self care, confined to bed or chair '[]'$     5    Moribund   Past Medical History:  Diagnosis Date   Diabetes mellitus without complication ([place])    Hypertension     Past Surgical History:  Procedure Laterality Date   BIOPSY  08/26/2021   Procedure: BIOPSY;  Surgeon: Eloise Harman, DO;  [facility];  Service: Endoscopy;;   ESOPHAGOGASTRODUODENOSCOPY (EGD) WITH PROPOFOL N/A 08/26/2021    Procedure: ESOPHAGOGASTRODUODENOSCOPY (EGD) WITH PROPOFOL;  Surgeon: Eloise Harman, DO;  [facility];  Service: Endoscopy;  Laterality: N/A;  2:00pm    Family History  Problem Relation Age of Onset   Cancer Father        prostate   Cancer Sister    Colon cancer Neg Hx    Esophageal cancer Neg Hx      Social History   Tobacco Use  Smoking Status Every Day   Packs/day: 0.50   Types: Cigarettes  Smokeless Tobacco Never    Social History   Substance and Sexual Activity  Alcohol Use Yes   Alcohol/week: 14.0 standard drinks of alcohol   Types: 14 Cans of beer per week   Comment: 2- 3 16 oz beer daily.     No Known Allergies  Current Outpatient Medications  Medication Sig Dispense Refill   allopurinol (ZYLOPRIM) 300 MG tablet Take 1 tablet (300 mg total) by  mouth daily. 30 tablet 5   amLODipine (NORVASC) 5 MG tablet Take 1 tablet (5 mg total) by mouth daily. 30 tablet 1   ibuprofen (ADVIL) 600 MG tablet Take 1 tablet (600 mg total) by mouth every 8 (eight) hours as needed. 90 tablet 1   magnesium oxide (MAG-OX) 400 (240 Mg) MG tablet Take 1 tablet (400 mg total) by mouth 2 (two) times daily. 60 tablet 1   Vitamin D, Ergocalciferol, (DRISDOL) 1.25 MG (50000 UNIT) CAPS capsule Take 1 capsule (50,000 Units total) by mouth every 7 (seven) days. 5 capsule 1   No current facility-administered medications for this visit.    Review of Systems  Respiratory:  Negative for cough.   Cardiovascular:  Negative for chest pain.  Gastrointestinal:  Positive for abdominal pain, heartburn and vomiting.  Neurological:  Negative for dizziness.     PHYSICAL EXAMINATION: BP (!) 190/100   Pulse 100   Resp 20   Ht '5\' 6"'$  (1.676 m)   Wt 126 lb (57.2 kg)   SpO2 100% Comment: RA  BMI 20.34 kg/m  Physical Exam Constitutional:      General: He is not in acute distress.    Appearance: Normal appearance. He is not ill-appearing.  Eyes:     Extraocular Movements: Extraocular  movements intact.  Cardiovascular:     Rate and Rhythm: Normal rate.  Pulmonary:     Effort: Pulmonary effort is normal. No respiratory distress.  Abdominal:     General: Abdomen is flat. There is no distension.  Musculoskeletal:        General: Normal range of motion.     Cervical back: Normal range of motion.  Skin:    General: Skin is warm and dry.  Neurological:     General: No focal deficit present.     Mental Status: He is alert and oriented to person, place, and time.     Diagnostic Studies & Laboratory data:    CT Scan:  IMPRESSION: 1. Circumferential irregular wall thickening along a length of approximately 9 cm in the mid and distal thoracic esophagus consistent with the clinical history of esophageal neoplasm. Haziness in the periesophageal fat suggests transmural extension of tumor. Small adjacent 6 mm paraesophageal lymph node likely represents nodal metastasis. 2. Approximately 1.3 cm endobronchial versus Peri bronchial smooth nodule adjacent to a segmental bronchus within the lingula. Differential considerations include intrapulmonary lymph node, endobronchial neoplasm (carcinoid is a consideration), or less likely metastatic disease. PET-CT and/or bronchoscopy could further evaluate. 3. Focal patch of ground-glass attenuation airspace opacity in the posterior right upper lobe suggests an active infectious/inflammatory process such as early bronchopneumonia or small volume aspiration. 4. Marked distension of the stomach and proximal duodenum without evidence of obstructing mass. Findings may reflect a recent large meal. 5. Tiny 2 mm subpleural pulmonary nodule along the minor fissure is highly likely benign. Recommend attention on future follow-up imaging.   PET/CT:  IMPRESSION: Positive for large esophageal mass involving 1/2 to 1/3 of the esophagus along its distal aspect and associated with marked increased metabolic activity, upper abdominal nodal  metastases and pulmonary metastatic disease.   Intramuscular lesions, while atypical are highly suspicious for if not diagnostic of metastatic lesions in the setting of esophageal neoplasm. Most accessible areas which could be considered for biopsy are in the LEFT erector spinae musculature and in the area of the LEFT deltoid muscle.   Small bilateral pleural effusions, aortic atherosclerosis, coronary artery disease and signs of cirrhosis with small  volume ascites as described.    EGD/EUS:  A large, fungating and ulcerating mass was found in the lower third of the esophagus, 30 cm from the incisors. The mass was nearly obstructing and circumferential. I was unable to advance endosocpe past the lesion. Biopsies were taken with a cold forceps for histology. Procedure was then aborted. - Partially obstructing, likely malignant esophageal tumor was found in the lower third of the esophagus.  Path: FINAL MICROSCOPIC DIAGNOSIS:   A. 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   Recent Lab Findings: Lab Results  Component Value Date   WBC 9.8 09/13/2021   HGB 10.1 (L) 09/13/2021   HCT 32.5 (L) 09/13/2021   PLT 342 09/13/2021   GLUCOSE 92 09/13/2021   CHOL 157 08/18/2021   TRIG 66 08/18/2021   HDL 61 08/18/2021   LDLCALC 83 08/18/2021   ALT 7 09/13/2021   AST 11 (L) 09/13/2021   NA 139 09/13/2021   K 3.2 (L) 09/13/2021   CL 103 09/13/2021   CREATININE 0.66 09/13/2021   BUN 14 09/13/2021   CO2 29 09/13/2021   TSH 0.365 05/30/2021   HGBA1C 5.2 08/18/2021       Assessment / Plan:   51 year old male with a large fungating esophageal squamous carcinoma and the mid esophagus.  On PET/CT it appears as though he has avidity and pulmonary nodule as well as a musculoskeletal lesion.  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  Given the likelihood that this patient may have metastatic esophageal cancer he likely will not be a surgical candidate, but this must be confirmed with tissue diagnosis of either the musculoskeletal nodule or the pulmonary lesion.  If he is not a surgical candidate then I think it is okay to proceed with PEG tube placement.     I  spent 20 minutes with the patient face to face counseling and coordination of care.    Avril Busser O Obelia Bonello 09/30/2021 1:00 PM

## 2021-09-27 ENCOUNTER — Encounter: Payer: Self-pay | Admitting: General Surgery

## 2021-09-27 ENCOUNTER — Encounter: Payer: Self-pay | Admitting: *Deleted

## 2021-09-27 ENCOUNTER — Ambulatory Visit (INDEPENDENT_AMBULATORY_CARE_PROVIDER_SITE_OTHER): Payer: 59 | Admitting: General Surgery

## 2021-09-27 VITALS — BP 170/115 | HR 121 | Temp 98.1°F | Resp 14 | Ht 66.0 in | Wt 126.0 lb

## 2021-09-27 DIAGNOSIS — C159 Malignant neoplasm of esophagus, unspecified: Secondary | ICD-10-CM | POA: Diagnosis not present

## 2021-09-28 ENCOUNTER — Inpatient Hospital Stay (HOSPITAL_COMMUNITY): Payer: 59 | Admitting: Licensed Clinical Social Worker

## 2021-09-28 ENCOUNTER — Encounter (HOSPITAL_COMMUNITY): Payer: Self-pay

## 2021-09-28 DIAGNOSIS — C159 Malignant neoplasm of esophagus, unspecified: Secondary | ICD-10-CM

## 2021-09-28 NOTE — Progress Notes (Signed)
Trevor [name]; [id number]; 10-10-1970   HPI Patient is a 51 year old black male who was referred to my care by Dr. Delton Coombes of oncology for Port-A-Cath insertion and possible gastrostomy tube placement.  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  This was found on EGD recently.  Patient is undergoing work-up for metastatic disease.  He states that he is able to take liquids, but solids resulted in emesis of undigested food.  He is about to undergo chemotherapy.  He is also going to see Dr. Kipp Brood of Cardiothoracic surgery for further evaluation. Past Medical History:  Diagnosis Date   Diabetes mellitus without complication ([place])    Hypertension     Past Surgical History:  Procedure Laterality Date   BIOPSY  08/26/2021   Procedure: BIOPSY;  Surgeon: Eloise Harman, DO;  [facility];  Service: Endoscopy;;   ESOPHAGOGASTRODUODENOSCOPY (EGD) WITH PROPOFOL N/A 08/26/2021   Procedure: ESOPHAGOGASTRODUODENOSCOPY (EGD) WITH PROPOFOL;  Surgeon: Eloise Harman, DO;  [facility];  Service: Endoscopy;  Laterality: N/A;  2:00pm    Family History  Problem Relation Age of Onset   Cancer Father        prostate   Cancer Sister    Colon cancer Neg Hx    Esophageal cancer Neg Hx     Current Outpatient Medications on File Prior to Visit  Medication Sig Dispense Refill   allopurinol (ZYLOPRIM) 300 MG tablet Take 1 tablet (300 mg total) by mouth daily. 30 tablet 5   amLODipine (NORVASC) 5 MG tablet Take 1 tablet (5 mg total) by mouth daily. 30 tablet 1   ibuprofen (ADVIL) 600 MG tablet Take 1 tablet (600 mg total) by mouth every 8 (eight) hours as needed. 90 tablet 1   magnesium oxide (MAG-OX) 400 (240 Mg) MG tablet Take 1 tablet (400 mg total) by mouth 2 (two) times daily. 60 tablet 1   Vitamin D, Ergocalciferol, (DRISDOL) 1.25 MG (50000 UNIT) CAPS capsule Take 1 capsule (50,000 Units total) by mouth every 7 (seven) days. 5  capsule 1   No current facility-administered medications on file prior to visit.    No Known Allergies  Social History   Substance and Sexual Activity  Alcohol Use Yes   Alcohol/week: 14.0 standard drinks   Types: 14 Cans of beer per week   Comment: 2- 3 16 oz beer daily.    Social History   Tobacco Use  Smoking Status Every Day   Packs/day: 0.50   Types: Cigarettes  Smokeless Tobacco Never    Review of Systems  Constitutional: Negative.   HENT: Negative.    Eyes:  Positive for blurred vision.  Respiratory: Negative.    Cardiovascular: Negative.   Gastrointestinal:  Positive for vomiting.  Genitourinary:  Positive for frequency.  Musculoskeletal: Negative.   Skin: Negative.   Neurological: Negative.   Endo/Heme/Allergies: Negative.   Psychiatric/Behavioral: Negative.     Objective   Vitals:   09/27/21 1458  BP: (!) 170/115  Pulse: (!) 121  Resp: 14  Temp: 98.1 F (36.7 C)  SpO2: 98%    Physical Exam Vitals reviewed.  Constitutional:      Appearance: Normal appearance. He is normal weight. He is not ill-appearing.  HENT:     Head: Normocephalic and atraumatic.  Cardiovascular:     Rate and Rhythm: Normal rate and regular rhythm.     Heart sounds: Normal heart sounds. No murmur heard.   No friction  rub. No gallop.  Pulmonary:     Effort: Pulmonary effort is normal. No respiratory distress.     Breath sounds: Normal breath sounds. No stridor. No wheezing, rhonchi or rales.  Abdominal:     General: Abdomen is flat. Bowel sounds are normal. There is no distension.     Palpations: Abdomen is soft. There is no mass.     Tenderness: There is no abdominal tenderness. There is no guarding or rebound.     Hernia: No hernia is present.  Skin:    General: Skin is warm and dry.  Neurological:     Mental Status: He is alert and oriented to person, place, and time.   Dr. Tomie China notes reviewed Assessment  Squamous cell carcinoma of distal  esophagus Plan  We will place Port-A-Cath in the near future for chemotherapy.  As the tumor is near obstructing in nature, patient is not a candidate for a PEG.  He would have to undergo either an open gastrostomy tube placement or IR placement.  This is dependent on whether Dr. Kipp Brood feels he is a candidate for surgical intervention.  I will defer to his judgment as to whether the gastrostomy tube should be placed.  If needed, both can be done at the same time.  The risks and benefits of both procedures including bleeding, infection, and the possibility of pneumothorax were fully explained to the patient, who gave informed consent.

## 2021-09-28 NOTE — Progress Notes (Signed)
[place] Work  Initial Assessment   Trevor [name] is a 51 y.o. year old male presenting alone. Clinical Social Work was referred by medical provider for assessment of psychosocial needs.   SDOH (Social Determinants of Health) assessments performed: Yes   SDOH Screenings   Alcohol Screen: Not on file  Depression (PHQ2-9): Low Risk    PHQ-2 Score: 0  Financial Resource Strain: Not on file  Food Insecurity: Not on file  Housing: Not on file  Physical Activity: Not on file  Social Connections: Not on file  Stress: Not on file  Tobacco Use: High Risk   Smoking Tobacco Use: Every Day   Smokeless Tobacco Use: Never   Passive Exposure: Not on file  Transportation Needs: Not on file     Distress Screen completed: No     View : No data to display.            Family/Social Information:  Housing Arrangement: patient lives alone Family members/support persons in your life? Pt's brother lives a few doors down and his step father resides around the corner, but most of pt's family resides in [place] concerns: Pt states his brother or step father can drive him to appointments if need be.  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  Pt is trying to speak with his boss to see if his duties can be modified to allow him to continue to try to work as he undergoes treatment.  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  Income source: Employment Financial concerns:  Pt does not have immediate financial concerns; however, if he is unable to continue to work he will not be able to pay his rent and is concerned as he does not have benefits through work. Type of concern: Rent/ mortgage Food access concerns: no Religious or spiritual practice: Not known Services Currently in place:  none  Coping/ Adjustment to diagnosis: Patient understands treatment plan and what happens next? 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 Patient enjoys  not identified Current coping skills/ strengths: Capable of independent living     SUMMARY: Current SDOH Barriers:  Financial constraints related to not being able to work while undergoing treatment if it is not tolerated well and he is unable to get modified duties and Limited social support  Clinical Social Work Clinical Goal(s):  Provide pt w/ supportive services as needs are identified.  Interventions: Discussed common feeling and emotions when being diagnosed with cancer, and the importance of support during treatment Informed patient of the support team roles and support services at [facility] Provided CSW contact information and encouraged patient to call with any questions or concerns Referred patient to [facility].  Discussed disability at length w/ pt and will assist with referral to the [facility] if it becomes appropriate.     Follow Up Plan: Patient will contact CSW with any support or resource needs Patient verbalizes understanding of plan: Yes    Henriette Combs, LCSW

## 2021-09-28 NOTE — Progress Notes (Signed)
Call placed to the patient to offer second medical oncology opinion referral per instruction from Dr. Delton Coombes. Detailed VM left asking that the patient call me back.

## 2021-09-29 NOTE — Patient Instructions (Signed)
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 You will be treated in the clinic every 2 weeks with a combination of chemotherapy drugs and an immunotherapy drug. The chemotherapy drugs are oxaliplatin and fluorouracil (5FU). The immunotherapy drug you will receive is called Opdivo. There is also an infusion called leucovorin you will receive. It is a vitamin that helps the 5FU work better to treat the cancer. The intent of treatment is to control this cancer, prevent it from spreading further, and to alleviate any symptoms you may be having related to this disease. We will continue this treatment as long as it effective in keeping the cancer under control, or until you decide you want to stop treatment. You will see the doctor regularly throughout treatment.  We will obtain blood work from you prior to every treatment and monitor your results to make sure it is safe to give your treatment. The doctor monitors your response to treatment by the way you are feeling, your blood work, and by obtaining scans periodically.  There will be wait times while you are here for treatment. It will take about 30 minutes to 1 hour for your lab work to result. Then there will be wait times while pharmacy mixes your medications.    Medications you will receive in the clinic prior to your chemotherapy medications:  Aloxi:  ALOXI is used in adults to help prevent nausea and vomiting that happens with certain chemotherapy drugs.  Aloxi is a long acting medication, and will remain in your system for about two days.   Dexamethasone:  This is a steroid given prior to chemotherapy to help prevent allergic reactions; it may also help prevent and control nausea and diarrhea.     Oxaliplatin (Eloxatin)  About This Drug  Oxaliplatin is used to treat cancer. It is given in the vein (IV).  It takes two hours to infuse.  Possible Side Effects   Bone marrow  suppression. This is a decrease in the number of white blood cells, red blood cells, and platelets. This may raise your risk of infection, make you tired and weak (fatigue), and raise your risk of bleeding.   Tiredness   Soreness of the mouth and throat. You may have red areas, white patches, or sores that hurt.   Nausea and vomiting (throwing up)   Diarrhea (loose bowel movements)   Changes in your liver function   Effects on the nerves called peripheral neuropathy. You may feel numbness, tingling, or pain in your hands and feet, and may be worse in cold temperatures. It may be hard for you to button your clothes, open jars, or walk as usual. The effect on the nerves may get worse with more doses of the drug. These effects get better in some people after the drug is stopped but it does not get better in all people  Note: Each of the side effects above was reported in 40% or greater of patients treated with oxaliplatin. Not all possible side effects are included above.   Warnings and Precautions   Allergic reactions, including anaphylaxis, which may be life-threatening are rare but may happen in some patients. Signs of allergic reaction to this drug may be swelling of the face, feeling like your tongue or throat are swelling, trouble breathing, rash, itching, fever, chills, feeling dizzy, and/or feeling that your heart is beating in a fast or not normal way. If this happens, do not take another dose of  this drug. You should get urgent medical treatment.   Inflammation (swelling) of the lungs, which may be life-threatening. You may have a dry cough or trouble breathing.   Effects on the nerves (neuropathy) may resolve within 14 days, or it may persist beyond 14 days.   Severe decrease in white blood cells when combined with the chemotherapy agents 5-fluorouracil and leucovorin. This may be life-threatening.   Severe changes in your liver function   Abnormal heart beat and/or EKG, which  can be life-threatening   Rhabdomyolysis- damage to your muscles which may release proteins in your blood and affect how your kidneys work, which can be life-threatening. You may have severe muscle weakness and/or pain, or dark urine.  Important Information   This drug may impair your ability to drive or use machinery. Talk to your doctor and/or nurse about precautions you may need to take.   This drug may be present in the saliva, tears, sweat, urine, stool, vomit, semen, and vaginal secretions. Talk to your doctor and/or your nurse about the necessary precautions to take during this time.  * The effects on the nerves can be aggravated by exposure to cold. Avoid cold beverages, use of ice and make sure you cover your skin and dress warmly prior to being exposed to cold temperatures while you are receiving treatment with oxaliplatin*   Treating Side Effects   Manage tiredness by pacing your activities for the day.   Be sure to include periods of rest between energy-draining activities.   To decrease the risk of infection, wash your hands regularly.   Avoid close contact with people who have a cold, the flu, or other infections.  Take your temperature as your doctor or nurse tells you, and whenever you feel like you may have a fever.   To help decrease the risk of bleeding, use a soft toothbrush. Check with your nurse before using dental floss.   Be very careful when using knives or tools.   Use an electric shaver instead of a razor.   Drink plenty of fluids (a minimum of eight glasses per day is recommended).   Mouth care is very important. Your mouth care should consist of routine, gentle cleaning of your teeth or dentures and rinsing your mouth with a mixture of 1/2 teaspoon of salt in 8 ounces of water or 1/2 teaspoon of baking soda in 8 ounces of water. This should be done at least after each meal and at bedtime.   If you have mouth sores, avoid mouthwash that has alcohol. Also  avoid alcohol and smoking because they can bother your mouth and throat.   To help with nausea and vomiting, eat small, frequent meals instead of three large meals a day. Choose foods and drinks that are at room temperature. Ask your nurse or doctor about other helpful tips and medicine that is available to help stop or lessen these symptoms.   If you throw up or have loose bowel movements, you should drink more fluids so that you do not become dehydrated (lack of water in the body from losing too much fluid).   If you have diarrhea, eat low-fiber foods that are high in protein and calories and avoid foods that can irritate your digestive tracts or lead to cramping.   Ask your nurse or doctor about medicine that can lessen or stop your diarrhea.   If you have numbness and tingling in your hands and feet, be careful when cooking, walking, and handling sharp  objects and hot liquids.   Do not drink cold drinks or use ice in beverages. Drink fluids at room temperature or warmer, and drink through a straw.   Wear gloves to touch cold objects, and wear warm clothing and cover you skin during cold weather.   Food and Drug Interactions   There are no known interactions of oxaliplatin with food and other medications.   This drug may interact with other medicines. Tell your doctor and pharmacist about all the prescription and over-the-counter medicines and dietary supplements (vitamins, minerals, herbs and others) that you are taking at this time. Also, check with your doctor or pharmacist before starting any new prescription or over-the-counter medicines, or dietary supplements to make sure that there are no interactions   When to Call the Doctor  Call your doctor or nurse if you have any of these symptoms and/or any new or unusual symptoms:   Fever of 100.4 F (38 C) or higher   Chills   Tiredness that interferes with your daily activities   Feeling dizzy or lightheaded   Easy bleeding  or bruising   Feeling that your heart is beating in a fast or not normal way (palpitations)   Pain in your chest   Dry cough   Trouble breathing   Pain in your mouth or throat that makes it hard to eat or drink   Nausea that stops you from eating or drinking and/or is not relieved by prescribed medicines   Throwing up   Diarrhea, 4 times in one day or diarrhea with lack of strength or a feeling of being dizzy   Numbness, tingling, or pain in your hands and feet   Signs of possible liver problems: dark urine, pale bowel movements, bad stomach pain, feeling very tired and weak, unusual itching, or yellowing of the eyes or skin   Signs of rhabdomyolysis: decreased urine, very dark urine, muscle pain in the shoulders, thighs, or lower back; muscle weakness or trouble moving arms and legs   Signs of allergic reaction: swelling of the face, feeling like your tongue or throat are swelling, trouble breathing, rash, itching, fever, chills, feeling dizzy, and/or feeling that your heart is beating in a fast or not normal way. If this happens, call 911 for emergency care.   If you think you may be pregnant  Reproduction Warnings   Pregnancy warning: This drug may have harmful effects on the unborn baby. Women of childbearing potential should use effective methods of birth control during your cancer treatment. Let your doctor know right away if you think you may be pregnant or may have impregnated your partner.   Breastfeeding warning: It is not known if this drug passes into breast milk. For this reason, women should talk to their doctor about the risks and benefits of breastfeeding during treatment with this drug because this drug may enter the breast milk and cause harm to a breastfeeding baby.   Fertility warning: Human fertility studies have not been done with this drug. Talk with your doctor or nurse if you plan to have children. Ask for information on sperm or egg banking.   Leucovorin  Calcium  About This Drug  Leucovorin is a vitamin. It is used in combination with other cancer fighting drugs such as 5-fluorouracil and methotrexate. Leucovorin is given in the vein (IV).  This drug runs at the same time as the oxaliplatin and takes 2 hours to infuse.   Possible Side Effects  Rash and itching  Note: Leucovorin by itself has very few side effects. Other side effects you may have can be caused by the other drugs you are taking, such as 5-fluorouracil.   Warnings and Precautions   Allergic reactions, including anaphylaxis are rare but may happen in some patients. Signs of allergic reaction to this drug may be swelling of the face, feeling like your tongue or throat are swelling, trouble breathing, rash, itching, fever, chills, feeling dizzy, and/or feeling that your heart is beating in a fast or not normal way. If this happens, do not take another dose of this drug. You should get urgent medical treatment.  Food and Drug Interactions   There are no known interactions of leucovorin with food.   This drug may interact with other medicines. Tell your doctor and pharmacist about all the prescription and over-the-counter medicines and dietary supplements (vitamins, minerals, herbs and others) that you are taking at this time.   Also, check with your doctor or pharmacist before starting any new prescription or over-the-counter medicines, or dietary supplements to make sure that there are no interactions.   When to Call the Doctor  Call your doctor or nurse if you have any of these symptoms and/or any new or unusual symptoms:   A new rash or a rash that is not relieved by prescribed medicines   Signs of allergic reaction: swelling of the face, feeling like your tongue or throat are swelling, trouble breathing, rash, itching, fever, chills, feeling dizzy, and/or feeling that your heart is beating in a fast or not normal way. If this happens, call 911 for emergency care.   If  you think you may be pregnant   Reproduction Warnings   Pregnancy warning: It is not known if this drug may harm an unborn child. For this reason, be sure to talk with your doctor if you are pregnant or planning to become pregnant while receiving this drug. Let your doctor know right away if you think you may be pregnant   Breastfeeding warning: It is not known if this drug passes into breast milk. For this reason, women should talk to their doctor about the risks and benefits of breastfeeding during treatment with this drug because this drug may enter the breast milk and cause harm to a breastfeeding baby.   Fertility warning: Human fertility studies have not been done with this drug. Talk with your doctor or nurse if you plan to have children. Ask for information on sperm or egg banking.   5-Fluorouracil (Adrucil; 5FU)  About This Drug  Fluorouracil is used to treat cancer. It is given in the vein (IV). It is given as an IV push from a syringe and also as a continuous infusion given via an ambulatory pump (a pump you take home and wear for a specified amount of time).  Possible Side Effects   Bone marrow suppression. This is a decrease in the number of white blood cells, red blood cells, and platelets. This may raise your risk of infection, make you tired and weak (fatigue), and raise your risk of bleeding   Changes in the tissue of the heart and/or heart attack. Some changes may happen that can cause your heart to have less ability to pump blood.   Blurred vision or other changes in eyesight   Nausea and throwing up (vomiting)   Diarrhea (loose bowel movements)   Ulcers - sores that may cause pain or bleeding in your digestive tract, which includes your mouth, esophagus, stomach, small/large  intestines and rectum   Soreness of the mouth and throat. You may have red areas, white patches, or sores that hurt.   Allergic reactions, including anaphylaxis are rare but may happen in  some patients. Signs of allergic reaction to this drug may be swelling of the face, feeling like your tongue or throat are swelling, trouble breathing, rash, itching, fever, chills, feeling dizzy, and/or feeling that your heart is beating in a fast or not normal way. If this happens, do not take another dose of this drug. You should get urgent medical treatment.   Sensitivity to light (photosensitivity). Photosensitivity means that you may become more sensitive to the sun and/or light. You may get a skin rash/reaction if you are in the sun or are exposed to sun lamps and tanning beds. Your eyes may water more, mostly in bright light.   Changes in your nail color, nail loss and/or brittle nail   Darkening of the skin, or changes to the color of your skin and/or veins used for infusion   Rash, dry skin, or itching  Note: Not all possible side effects are included above.  Warnings and Precautions   Hand-and-foot syndrome. The palms of your hands or soles of your feet may tingle, become numb, painful, swollen, or red.   Changes in your central nervous system can happen. The central nervous system is made up of your brain and spinal cord. You could feel extreme tiredness, agitation, confusion, hallucinations (see or hear things that are not there), trouble understanding or speaking, loss of control of your bowels or bladder, eyesight changes, numbness or lack of strength to your arms, legs, face, or body, or coma. If you start to have any of these symptoms let your doctor know right away.   Side effects of this drug may be unexpectedly severe in some patients  Note: Some of the side effects above are very rare. If you have concerns and/or questions, please discuss them with your medical team.   Important Information   This drug may be present in the saliva, tears, sweat, urine, stool, vomit, semen, and vaginal secretions. Talk to your doctor and/or your nurse about the necessary precautions to  take during this time.   Treating Side Effects   Manage tiredness by pacing your activities for the day.   Be sure to include periods of rest between energy-draining activities.   To help decrease the risk of infections, wash your hands regularly.   Avoid close contact with people who have a cold, the flu, or other infections.   Take your temperature as your doctor or nurse tells you, and whenever you feel like you may have a fever.   Use a soft toothbrush. Check with your nurse before using dental floss.   Be very careful when using knives or tools.   Use an electric shaver instead of a razor.   If you have a nose bleed, sit with your head tipped slightly forward. Apply pressure by lightly pinching the bridge of your nose between your thumb and forefinger. Call your doctor if you feel dizzy or faint or if the bleeding doesn't stop after 10 to 15 minutes.   Drink plenty of fluids (a minimum of eight glasses per day is recommended).   If you throw up or have loose bowel movements, you should drink more fluids so that you do not  become dehydrated (lack of water in the body from losing too much fluid).   To help with nausea and  vomiting, eat small, frequent meals instead of three large meals a day. Choose foods and drinks that are at room temperature. Ask your nurse or doctor about other helpful tips and medicine that is available to help, stop, or lessen these symptoms.   If you have diarrhea, eat low-fiber foods that are high in protein and calories and avoid foods that can irritate your digestive tracts or lead to cramping.   Ask your nurse or doctor about medicine that can lessen or stop your diarrhea.   Mouth care is very important. Your mouth care should consist of routine, gentle cleaning of your teeth or dentures and rinsing your mouth with a mixture of 1/2 teaspoon of salt in 8 ounces of water or 1/2 teaspoon of baking soda in 8 ounces of water. This should be done at least  after each meal and at bedtime.   If you have mouth sores, avoid mouthwash that has alcohol. Also avoid alcohol and smoking because they can bother your mouth and throat.   Keeping your nails moisturized may help with brittleness.   To help with itching, moisturize your skin several times day.   Use sunscreen with SPF 30 or higher when you are outdoors even for a short time. Cover up when you are out in the sun. Wear wide-brimmed hats, long-sleeved shirts, and pants. Keep your neck, chest, and back covered. Wear dark sun glasses when in the sun or bright lights.   If you get a rash do not put anything on it unless your doctor or nurse says you may. Keep the area around the rash clean and dry. Ask your doctor for medicine if your rash bothers you.   Keeping your pain under control is important to your well-being. Please tell your doctor or nurse if you are experiencing pain.   Food and Drug Interactions   There are no known interactions of fluorouracil with food.   Check with your doctor or pharmacist about all other prescription medicines and over-the-counter medicines and dietary supplements (vitamins, minerals, herbs and others) you are taking before starting this medicine as there are known drug interactions with 5-fluoroucacil. Also, check with your doctor or pharmacist before starting any new prescription or over-the-counter medicines, or dietary supplements to make sure that there are no interactions.  When to Call the Doctor  Call your doctor or nurse if you have any of these symptoms and/or any new or unusual symptoms:   Fever of 100.4 F (38 C) or higher   Chills   Easy bleeding or bruising   Nose bleed that doesn't stop bleeding after 10-15 minutes   Trouble breathing   Feeling dizzy or lightheaded   Feeling that your heart is beating in a fast or not normal way (palpitations)   Chest pain or symptoms of a heart attack. Most heart attacks involve pain in the center  of the chest that lasts more than a few minutes. The pain may go away and come back or it can be constant. It can feel like pressure, squeezing, fullness, or pain. Sometimes pain is felt in one or both arms, the back, neck, jaw, or stomach. If any of these symptoms last 2 minutes, call 911.   Confusion and/or agitation   Hallucinations   Trouble understanding or speaking   Loss of control of bowels or bladder   Blurry vision or changes in your eyesight   Headache that does not go away   Numbness or lack of strength to your arms,  legs, face, or body   Nausea that stops you from eating or drinking and/or is not relieved by prescribed medicines   Throwing up more than 3 times a day   Diarrhea, 4 times in one day or diarrhea with lack of strength or a feeling of being dizzy   Pain in your mouth or throat that makes it hard to eat or drink   Pain along the digestive tract - especially if worse after eating   Blood in your vomit (bright red or coffee-ground) and/or stools (bright red, or black/tarry)   Coughing up blood   Tiredness that interferes with your daily activities   Painful, red, or swollen areas on your hands or feet or around your nails   A new rash or a rash that is not relieved by prescribed medicines   Develop sensitivity to sunlight/light   Numbness and/or tingling of your hands and/or feet   Signs of allergic reaction: swelling of the face, feeling like your tongue or throat are swelling, trouble breathing, rash, itching, fever, chills, feeling dizzy, and/or feeling that your heart is beating in a fast or not normal way. If this happens, call 911 for emergency care.   If you think you are pregnant or may have impregnated your partner  Reproduction Warnings   Pregnancy warning: This drug may have harmful effects on the unborn baby. Women of child bearing potential should use effective methods of birth control during your cancer treatment and 3 months after  treatment. Men with male partners of childbearing potential should use effective methods of birth control during your cancer treatment and for 3 months after your cancer treatment. Let your doctor know right away if you think you may be pregnant or may have impregnated your partner.   Breastfeeding warning: It is not known if this drug passes into breast milk. For this reason, Women should not breastfeed during treatment because this drug could enter the breast milk and cause harm to a breastfeeding baby.   Fertility warning: In men and women both, this drug may affect your ability to have children in the future. Talk with your doctor or nurse if you plan to have children. Ask for information on sperm or egg banking.  Nivolumab (Opdivo)  About This Drug Nivolumab is used to treat cancer. It is given in the vein (IV). It will take 30 minutes to infuse.   Possible Side Effects  Nausea and vomiting   Pain in your abdomen   Diarrhea (loose bowel movements)   Constipation (not able to move bowels)   Tiredness and weakness   Fever   Decreased appetite (decreased hunger)   Joint, muscle and bone pain   Back pain   Headache   Cough and trouble breathing   Upper respiratory tract infection   Urinary tract infection   Rash and itching  Note: Each of the side effects above was reported in 20% or greater of patients treated with nivolumab alone. Your side effects may be different or more severe if you receive nivolumab in combination with other chemotherapy agents. Not all possible side effects are included above.  Warnings and Precautions  This drug works with your immune system and can cause inflammation in any of your organs and tissues and can change how they work. This may put you at risk for developing serious medical problems which can be life-threatening. These side effects may require treatment with steroids at the discretion of your doctor.   Inflammation (swelling) of  the lungs which can be life-threatening. You may have a dry cough or trouble breathing.   Colitis, which is swelling (inflammation) in the colon. The symptoms are loose bowel movements (diarrhea), stomach cramping, and sometimes blood in the bowel movements.   Changes in your central nervous system can happen. The central nervous system is made up of your brain and spinal cord. You could feel extreme tiredness, agitation, confusion, hallucinations (see or hear things that are not there), trouble understanding or speaking, loss of control of your bowels or bladder, eyesight changes, numbness or lack of strength to your arms, legs, face, or body, and coma. If you start to have any of these symptoms let your doctor know right away.   Severe changes in your liver function, which can cause liver failure and be life-threatening.   This drug may affect some of your hormone glands (especially the thyroid, adrenals, pituitary, and pancreas).   Blood sugar levels may change, and you may develop diabetes. If you already have diabetes, changes may need to be made to your diabetes medication.   Changes in your kidney function   Allergic skin reaction which can very rarely be life-threatening. You may develop blisters on your skin that are filled with fluid or a severe red rash all over your body that may be painful.   While you are getting this drug in your vein (IV), you may have a reaction to the drug. Sometimes you may be given medication to stop or lessen these side effects. Your nurse will check you closely for these signs: fever or shaking chills, flushing, facial swelling, feeling dizzy, headache, trouble breathing, rash, itching, chest tightness, or chest pain. These reactions may happen after your infusion. If this happens, call 911 for emergency care.   Increased risk of serious complications that can be life-threatening such as graft versus host disease (GVHD) in patients who undergo a stem cell  transplant before or after receiving nivolumab.   Increased risk of organ rejection in patients who have received donor organs  Important Information  This drug may be present in the saliva, tears, sweat, urine, stool, vomit, semen, and vaginal secretions. Talk to your doctor and/or your nurse about the necessary precautions to take during this time.  Treating Side Effects  Manage tiredness by pacing your activities for the day.   Be sure to include periods of rest between energy-draining activities.   Get regular exercise. If you feel too tired to exercise vigorously, try taking a short walk.   To help decrease the risk of bleeding, use a soft toothbrush. Check with your nurse before using dental floss.   Be very careful when using knives or tools.   Use an electric shaver instead of a razor.   Drink plenty of fluids (a minimum of eight glasses per day is recommended).   To help with nausea and vomiting, eat small, frequent meals instead of three large meals a day. Choose foods and drinks that are at room temperature.   If you throw up or have diarrhea, you should drink more fluids so that you do not become dehydrated (lack of water in the body from losing too much fluid).   If you have diarrhea, eat low-fiber foods that are high in protein and calories, and avoid foods that can irritate your digestive tracts or lead to cramping.   If you are not able to move your bowels, check with your doctor or nurse before you use any enemas, laxatives, or suppositories.  Ask your doctor or nurse about medicine that is available to help stop or lessen diarrhea, constipation, and/or nausea/vomiting.   To help with decreased appetite, eat small, frequent meals. Eat foods high in calories and protein, such as meat, poultry, fish, dry beans, tofu, eggs, nuts, milk, yogurt, cheese, ice cream, pudding, and nutritional supplements.   Consider using sauces and spices to increase taste. Daily  exercise, with your doctor's approval, may increase your appetite.   If you have diabetes, keep good control of your blood sugar level. Tell your nurse or your doctor if your glucose levels are higher or lower than normal.   Keeping your pain under control is important to your well-being. Please tell your doctor or nurse if you are experiencing pain.   If you get a rash do not put anything on it unless your doctor or nurse says you may. Keep the area around the rash clean and dry. Ask your doctor for medicine if your rash bothers you.   To help with itching, moisturize your skin several times a day.   Avoid sun exposure and apply sunscreen routinely when outdoors.   If you have numbness and tingling in your hands and feet, be careful when cooking, walking, and handling sharp objects and hot liquids.   Infusion reactions may happen after your infusion. If this happens, call 911 for emergency care.  Food and Drug Interactions  There are no known interactions of nivolumab with food.  This drug may interact with other medicines. Tell your doctor and pharmacist about all the prescription and over-the-counter medicines and dietary supplements (vitamins, minerals, herbs, and others) that you are taking at this time. Also, check with your doctor or pharmacist before starting any new prescription or over-the-counter medicines, or dietary supplements to make sure that there are no interactions.  When to Call the Doctor Not all possible side effects are included. Some of these side effects, although rare, can be lifethreatening.  Lung problems:  Inflammation of the lungs  Cough  Trouble breathing  Upper respiratory tract infection  Call your doctor or nurse if you have any of these symptoms:  Wheezing or trouble breathing  New or worsening cough  Chest pain  Coughing up yellow, green, or blood mucus  Stomach problems:  Decreased appetite (decreased hunger)  Nausea and vomiting (throwing  up)  Diarrhea (loose bowel movements)  Constipation (unable to move bowels)  Pain in your abdomen  Inflammation of your colon  Blood in your stool  Call your doctor or nurse if you have any of these symptoms:  Nausea that stops you from eating or drinking or is not relieved by prescribed medicines  Throwing up more than 3 times a day  Lasting loss of appetite or rapid weight loss of five pounds in a week  Diarrhea, 4 times in one day or diarrhea with lack of strength or a feeling of being dizzy  No bowel movement for 3 days or you feel uncomfortable  Pain in your abdomen that does not go away  Blood in your stool (bright red or black/tarry)  Liver problems:  Changes in your liver function  Call your doctor or nurse if you have any of these symptoms:  Yellowing of the eyes or skin  Dark urine  Pale bowel movements  Pain on the right side of your abdomen that does not go away  Feeling very tired and weak  Unusual itching  Easy bleeding or bruising  Hormone gland problems:  Changes  in some of your hormone glands (especially the thyroid, adrenals, pituitary, and pancreas)  Blood sugar levels may change, and you may develop diabetes  Call your doctor or nurse if you have any of these symptoms:  Headache that does not go away  Tiredness and weakness that interferes with your daily activities  Feeling dizzy or lightheaded  Changes in mood or behavior such as irritability and/or feeling forgetful  Shakiness  Weight loss or weight gain  Nausea  Abnormal blood sugar  Unusual thirst or passing urine often  Feeling cold  Kidney problems:  Changes in your kidney function  Urinary tract infection  Call your doctor or nurse if you have any of these symptoms:  Decreased or very dark urine  Cloudy urine and/or urine that smells bad  Difficulty urinating  Pain or burning when you pass urine  Feeling like you have to pass urine often, but not much comes out when you do  Tender or  heavy feeling in your lower abdomen  Pain on one side of your back under your ribs  Skin problems:  Rash and itching  Soreness of the mouth and throat  Allergic skin reaction  Call your doctor or nurse if you have any of these symptoms:  New rash and/or itching  Fluid-filled bumps/blisters  Rash that is not relieved by prescribed medicines  Red areas, white patches, or sores in your mouth that hurt  Inflammation of the brain:  Changes in your brain and spinal cord  Headache  Effects on the nerves  Call your doctor or nurse if you have any of these symptoms:  Headache that does not go away  Extreme tiredness, agitation, or confusion  Seizures  Hallucinations  Trouble understanding or speaking  Loss of control of bowels or bladder  Numbness or lack of strength to your arms, legs, face, or body  Numbness, tingling, pins, and needles, or pain in your arms, hands, legs, or feet  Other problems:  Low red blood cells, and platelets  Fever  Inflammation of your eye and/or other changes in vision  Allergic reaction to the drug  Heart problems  Electrolyte changes  Muscle, bone, and joint pain  Call your doctor or nurse if you have any of these symptoms:  Fever of 100.4 F (38 C) or higher  Chills, flushing  Easy bleeding or bruising  Blurred vision or other changes in eyesight  Sensitivity to light  Feeling that your heart is beating fast or in a not normal way (palpitations)   Signs of infusion reaction: fever or shaking chills, flushing, facial swelling, feeling dizzy, headache, trouble breathing, rash, itching, chest tightness, or chest pain. If this happens, call 911 for emergency care.  Pain that does not go away, or is not relieved by prescribed medicines  Extreme muscle weakness  Reproduction Warnings  Pregnancy warning: This drug can have harmful effects on the unborn baby. Women of childbearing potential should use effective methods of birth control during your  cancer treatment and for at least 5 months after stopping treatment. Let your doctor know right away if you think you may be pregnant.  Breastfeeding warning: It is not known if this drug passes into breast milk. For this reason, women should not breastfeed during treatment and for 5 months after stopping treatment because this drug could enter the breast milk and cause harm to a breastfeeding baby.  Fertility warning: Fertility studies have not been done with this drug. Talk with your doctor or nurse  if you plan to have children. Ask for information on sperm or egg banking.    SELF CARE ACTIVITIES WHILE RECEIVING CHEMOTHERAPY:  Hydration Increase your fluid intake 48 hours prior to treatment and drink at least 8 to 12 cups (64 ounces) of water/decaffeinated beverages per day after treatment. You can still have your cup of coffee or soda but these beverages do not count as part of your 8 to 12 cups that you need to drink daily. No alcohol intake.  Medications Continue taking your normal prescription medication as prescribed.  If you start any new herbal or new supplements please let us know first to make sure it is safe.  Mouth Care Have teeth cleaned professionally before starting treatment. Keep dentures and partial plates clean. Use soft toothbrush and do not use mouthwashes that contain alcohol. Biotene is a good mouthwash that is available at most pharmacies or may be ordered by calling [phone]. Use warm salt water gargles (1 teaspoon salt per 1 quart warm water) before and after meals and at bedtime. If you need dental work, please let the doctor know before you go for your appointment so that we can coordinate the best possible time for you in regards to your chemo regimen. You need to also let your dentist know that you are actively taking chemo. We may need to do labs prior to your dental appointment.  Skin Care Always use sunscreen that has not expired and with SPF (Sun Protection  Factor) of 50 or higher. Wear hats to protect your head from the sun. Remember to use sunscreen on your hands, ears, face, & feet.  Use good moisturizing lotions such as udder cream, eucerin, or even Vaseline. Some chemotherapies can cause dry skin, color changes in your skin and nails.    Avoid long, hot showers or baths. Use gentle, fragrance-free soaps and laundry detergent. Use moisturizers, preferably creams or ointments rather than lotions because the thicker consistency is better at preventing skin dehydration. Apply the cream or ointment within 15 minutes of showering. Reapply moisturizer at night, and moisturize your hands every time after you wash them.  Hair Loss (if your doctor says your hair will fall out)  If your doctor says that your hair is likely to fall out, decide before you begin chemo whether you want to wear a wig. You may want to shop before treatment to match your hair color. Hats, turbans, and scarves can also camouflage hair loss, although some people prefer to leave their heads uncovered. If you go bare-headed outdoors, be sure to use sunscreen on your scalp. Cut your hair short. It eases the inconvenience of shedding lots of hair, but it also can reduce the emotional impact of watching your hair fall out. Don't perm or color your hair during chemotherapy. Those chemical treatments are already damaging to hair and can enhance hair loss. Once your chemo treatments are done and your hair has grown back, it's OK to resume dyeing or perming hair.  With chemotherapy, hair loss is almost always temporary. But when it grows back, it may be a different color or texture. In older adults who still had hair color before chemotherapy, the new growth may be completely gray.  Often, new hair is very fine and soft.  Infection Prevention Please wash your hands for at least 30 seconds using warm soapy water. Handwashing is the #1 way to prevent the spread of germs. Stay away from sick  people or people who are getting over  a cold. If you develop respiratory systems such as green/yellow mucus production or productive cough or persistent cough let us know and we will see if you need an antibiotic. It is a good idea to keep a pair of gloves on when going into grocery stores/[employer] to decrease your risk of coming into contact with germs on the carts, etc. Carry alcohol hand gel with you at all times and use it frequently if out in public. If your temperature reaches 100.5 or higher please call the clinic and let us know.  If it is after hours or on the weekend please go to the ER if your temperature is over 100.5.  Please have your own personal thermometer at home to use.    Sex and bodily fluids If you are going to have sex, a condom must be used to protect the person that isn't taking chemotherapy. Chemo can decrease your libido (sex drive). For a few days after chemotherapy, chemotherapy can be excreted through your bodily fluids.  When using the toilet please close the lid and flush the toilet twice.  Do this for a few day after you have had chemotherapy.   Effects of chemotherapy on your sex life Some changes are simple and won't last long. They won't affect your sex life permanently.  Sometimes you may feel: too tired not strong enough to be very active sick or sore  not in the mood anxious or low Your anxiety might not seem related to sex. For example, you may be worried about the cancer and how your treatment is going. Or you may be worried about money, or about how you family are coping with your illness.  These things can cause stress, which can affect your interest in sex. It's important to talk to your partner about how you feel.  Remember - the changes to your sex life don't usually last long. There's usually no medical reason to stop having sex during chemo. The drugs won't have any long term physical effects on your performance or enjoyment of sex. Cancer can't be passed  on to your partner during sex  Contraception It's important to use reliable contraception during treatment. Avoid getting pregnant while you or your partner are having chemotherapy. This is because the drugs may harm the baby. Sometimes chemotherapy drugs can leave a man or woman infertile.  This means you would not be able to have children in the future. You might want to talk to someone about permanent infertility. It can be very difficult to learn that you may no longer be able to have children. Some people find counselling helpful. There might be ways to preserve your fertility, although this is easier for men than for women. You may want to speak to a fertility expert. You can talk about sperm banking or harvesting your eggs. You can also ask about other fertility options, such as donor eggs. If you have or have had breast cancer, your doctor might advise you not to take the contraceptive pill. This is because the hormones in it might affect the cancer. It is not known for sure whether or not chemotherapy drugs can be passed on through semen or secretions from the vagina. Because of this some doctors advise people to use a barrier method if you have sex during treatment. This applies to vaginal, anal or oral sex. Generally, doctors advise a barrier method only for the time you are actually having the treatment and for about a week after your treatment. Advice  like this can be worrying, but this does not mean that you have to avoid being intimate with your partner. You can still have close contact with your partner and continue to enjoy sex.  Animals If you have cats or birds we just ask that you not change the litter or change the cage.  Please have someone else do this for you while you are on chemotherapy.   Food Safety During and After Cancer Treatment Food safety is important for people both during and after cancer treatment. Cancer and cancer treatments, such as chemotherapy, radiation therapy,  and stem cell/bone marrow transplantation, often weaken the immune system. This makes it harder for your body to protect itself from foodborne illness, also called food poisoning. Foodborne illness is caused by eating food that contains harmful bacteria, parasites, or viruses.  Foods to avoid Some foods have a higher risk of becoming tainted with bacteria. These include: Unwashed fresh fruit and vegetables, especially leafy vegetables that can hide dirt and other contaminants Raw sprouts, such as alfalfa sprouts Raw or undercooked beef, especially ground beef, or other raw or undercooked meat and poultry Fatty, fried, or spicy foods immediately before or after treatment.  These can sit heavy on your stomach and make you feel nauseous. Raw or undercooked shellfish, such as oysters. Sushi and sashimi, which often contain raw fish.  Unpasteurized beverages, such as unpasteurized fruit juices, raw milk, raw yogurt, or cider Undercooked eggs, such as soft boiled, over easy, and poached; raw, unpasteurized eggs; or foods made with raw egg, such as homemade raw cookie dough and homemade mayonnaise  Simple steps for food safety  Shop smart. Do not buy food stored or displayed in an unclean area. Do not buy bruised or damaged fruits or vegetables. Do not buy cans that have cracks, dents, or bulges. Pick up foods that can spoil at the end of your shopping trip and store them in a cooler on the way home.  Prepare and clean up foods carefully. Rinse all fresh fruits and vegetables under running water, and dry them with a clean towel or paper towel. Clean the top of cans before opening them. After preparing food, wash your hands for 20 seconds with hot water and soap. Pay special attention to areas between fingers and under nails. Clean your utensils and dishes with hot water and soap. Disinfect your kitchen and cutting boards using 1 teaspoon of liquid, unscented bleach mixed into 1 quart of water.     Dispose of old food. Eat canned and packaged food before its expiration date (the "use by" or "best before" date). Consume refrigerated leftovers within 3 to 4 days. After that time, throw out the food. Even if the food does not smell or look spoiled, it still may be unsafe. Some bacteria, such as Listeria, can grow even on foods stored in the refrigerator if they are kept for too long.  Take precautions when eating out. At restaurants, avoid buffets and salad bars where food sits out for a long time and comes in contact with many people. Food can become contaminated when someone with a virus, often a norovirus, or another "bug" handles it. Put any leftover food in a "to-go" container yourself, rather than having the server do it. And, refrigerate leftovers as soon as you get home. Choose restaurants that are clean and that are willing to prepare your food as you order it cooked.   AT HOME MEDICATIONS:  Compazine/Prochlorperazine '10mg'$  tablet. Take 1 tablet every 6 hours as needed for nausea/vomiting. (This can make you sleepy)   EMLA cream. Apply a quarter size amount to port site 1 hour prior to chemo. Do not rub in. Cover with plastic wrap.    Diarrhea Sheet   If you are having loose stools/diarrhea, please purchase Imodium and begin taking as outlined:  At the first sign of poorly formed or loose stools you should begin taking Imodium (loperamide) 2 mg capsules.  Take two tablets ('4mg'$ ) followed by one tablet ('2mg'$ ) every 2 hours - DO NOT EXCEED 8 tablets in 24 hours.  If it is bedtime and you are having loose stools, take 2 tablets at bedtime, then 2 tablets every 4 hours until morning.   Always call the [facility] if you are having loose stools/diarrhea that you can't get under control.  Loose stools/diarrhea leads to  dehydration (loss of water) in your body.  We have other options of trying to get the loose stools/diarrhea to stop but you must let us know!   Constipation Sheet  Colace - 100 mg capsules - take 2 capsules daily.  If this doesn't help then you can increase to 2 capsules twice daily.  Please call if the above does not work for you. Do not go more than 2 days without a bowel movement.  It is very important that you do not become constipated.  It will make you feel sick to your stomach (nausea) and can cause abdominal pain and vomiting.  Nausea Sheet   Compazine/Prochlorperazine '10mg'$  tablet. Take 1 tablet every 6 hours as needed for nausea/vomiting (This can make you drowsy).  If you are having persistent nausea (nausea that does not stop) please call the [facility] and let us know the amount of nausea that you are experiencing.  If you begin to vomit, you need to call the [facility] and if it is the weekend and you have vomited more than one time and can't get it to stop-go to the Emergency Room.  Persistent nausea/vomiting can lead to dehydration (loss of fluid in your body) and will make you feel very weak and unwell. Ice chips, sips of clear liquids, foods that are at room temperature, crackers, and toast tend to be better tolerated.   SYMPTOMS TO REPORT AS SOON AS POSSIBLE AFTER TREATMENT:  FEVER GREATER THAN 100.4 F  CHILLS WITH OR WITHOUT FEVER  NAUSEA AND VOMITING THAT IS NOT CONTROLLED WITH YOUR NAUSEA MEDICATION  UNUSUAL SHORTNESS OF BREATH  UNUSUAL BRUISING OR BLEEDING  TENDERNESS IN MOUTH AND THROAT WITH OR WITHOUT   PRESENCE OF ULCERS  URINARY PROBLEMS  BOWEL PROBLEMS  UNUSUAL RASH      Wear comfortable clothing and clothing appropriate for easy access to any Portacath or PICC line. Let us know if there is anything that we can do to make your therapy better!    What to do if you need assistance after hours or on the weekends: CALL [phone].  HOLD on  the line, do not hang up.  You will hear multiple messages but at the end you will be connected with a nurse triage line.  They will contact the doctor if necessary.  Most of the time they will be able to assist you.  Do not call the hospital operator.      I have been informed and understand all of the instructions given to me and have received a copy. I have been instructed to call  the clinic [phone] or my family physician as soon as possible for continued medical care, if indicated. I do not have any more questions at this time but understand that I may call the [facility] or the Patient Navigator at [phone] during office hours should I have questions or need assistance in obtaining follow-up care.

## 2021-09-30 ENCOUNTER — Encounter: Payer: Self-pay | Admitting: *Deleted

## 2021-09-30 ENCOUNTER — Institutional Professional Consult (permissible substitution) (INDEPENDENT_AMBULATORY_CARE_PROVIDER_SITE_OTHER): Payer: 59 | Admitting: Thoracic Surgery (Cardiothoracic Vascular Surgery)

## 2021-09-30 ENCOUNTER — Encounter: Payer: Self-pay | Admitting: Thoracic Surgery (Cardiothoracic Vascular Surgery)

## 2021-09-30 VITALS — BP 190/100 | HR 100 | Resp 20 | Ht 66.0 in | Wt 126.0 lb

## 2021-09-30 DIAGNOSIS — K2289 Other specified disease of esophagus: Secondary | ICD-10-CM

## 2021-10-03 ENCOUNTER — Encounter (HOSPITAL_COMMUNITY): Payer: Self-pay

## 2021-10-03 ENCOUNTER — Other Ambulatory Visit (HOSPITAL_COMMUNITY): Payer: Self-pay

## 2021-10-03 NOTE — Progress Notes (Signed)
I have attempted to reach the patient this morning. Detailed VM left asking if the patient was prepared to proceed with chemotherapy, schedule pending biopsy, as well as schedule G tube and port placement. Asked that the patient return my call.

## 2021-10-04 ENCOUNTER — Other Ambulatory Visit (HOSPITAL_COMMUNITY): Payer: 59

## 2021-10-04 ENCOUNTER — Inpatient Hospital Stay (HOSPITAL_COMMUNITY): Payer: 59

## 2021-10-04 ENCOUNTER — Encounter (HOSPITAL_COMMUNITY): Payer: Self-pay

## 2021-10-04 ENCOUNTER — Other Ambulatory Visit (HOSPITAL_COMMUNITY): Payer: Self-pay

## 2021-10-04 ENCOUNTER — Encounter (HOSPITAL_COMMUNITY): Payer: Self-pay | Admitting: *Deleted

## 2021-10-04 DIAGNOSIS — C159 Malignant neoplasm of esophagus, unspecified: Secondary | ICD-10-CM

## 2021-10-04 MED ORDER — PROCHLORPERAZINE MALEATE 10 MG PO TABS: 10.0000 mg | ORAL_TABLET | Freq: Four times a day (QID) | ORAL | 6 refills | Status: DC | PRN

## 2021-10-04 MED ORDER — LOPERAMIDE HCL 2 MG PO CAPS: 2.0000 mg | ORAL_CAPSULE | ORAL | 4 refills | Status: DC | PRN

## 2021-10-04 NOTE — Progress Notes (Signed)
Order placed for CBCd, CMP, Mag per Dr. Delton Coombes verbal order.

## 2021-10-04 NOTE — Addendum Note (Signed)
Addended by: Joie Bimler on: 10/04/2021 03:43 PM   Modules accepted: Orders

## 2021-10-04 NOTE — Progress Notes (Signed)
I met with the patient today in the clinic. Explained to the patient that central scheduling as well as myself have been trying to reach him regarding second opinion, biopsy scheduling and start of treatment. Informed patient that per Dr. Delton Coombes, biopsy is required prior to starting chemotherapy. Patient expresses frustration and feels that this has delayed his treatment. Explained to patient that it appears they first tried to reach him and left him a VM on 09/26/21. I also tell him that I have left him a VM on 09/28/21 as well as 10/03/21. Patient continues to express frustration, but I share with patient that we would still like to expedite the process of starting treatment. Patient scheduled for biopsy, port-a-cath placement and start of chemotherapy and schedule provided today. Patient aware and verbalized understanding. Patient declines second medical oncology opinion as he would like to expedite start of treatment. All questions addressed and answered to the patient's satisfaction.

## 2021-10-04 NOTE — Progress Notes (Signed)
Chemotherapy/immunotherapy education packet given and discussed with pt in detail.  Discussed diagnosis, staging, tx regimen, and intent of tx.  Reviewed chemotherapy/immunotherapy medications and side effects, as well as pre-medications.  Instructed on how to manage side effects at home, and when to call the clinic.  Importance of fever/chills discussed with pt. Discussed precautions to implement at home after receiving tx, as well as self care strategies. Phone numbers provided for clinic during regular working hours, also how to reach the clinic after hours and on weekends. Pt provided the opportunity to ask questions - all questions answered to pt's satisfaction.    

## 2021-10-05 ENCOUNTER — Other Ambulatory Visit (HOSPITAL_COMMUNITY): Payer: Self-pay | Admitting: *Deleted

## 2021-10-05 ENCOUNTER — Inpatient Hospital Stay (HOSPITAL_COMMUNITY): Payer: 59

## 2021-10-05 DIAGNOSIS — C159 Malignant neoplasm of esophagus, unspecified: Secondary | ICD-10-CM

## 2021-10-05 DIAGNOSIS — C155 Malignant neoplasm of lower third of esophagus: Secondary | ICD-10-CM | POA: Diagnosis not present

## 2021-10-05 LAB — CBC WITH DIFFERENTIAL/PLATELET
Abs Immature Granulocytes: 0.04 10*3/uL (ref 0.00–0.07)
Basophils Absolute: 0 10*3/uL (ref 0.0–0.1)
Basophils Relative: 0 %
Eosinophils Absolute: 0.1 10*3/uL (ref 0.0–0.5)
Eosinophils Relative: 1 %
HCT: 30 % — ABNORMAL LOW (ref 39.0–52.0)
Hemoglobin: 9.9 g/dL — ABNORMAL LOW (ref 13.0–17.0)
Immature Granulocytes: 0 %
Lymphocytes Relative: 15 %
Lymphs Abs: 1.4 10*3/uL (ref 0.7–4.0)
MCH: 24.4 pg — ABNORMAL LOW (ref 26.0–34.0)
MCHC: 33 g/dL (ref 30.0–36.0)
MCV: 73.9 fL — ABNORMAL LOW (ref 80.0–100.0)
Monocytes Absolute: 0.7 10*3/uL (ref 0.1–1.0)
Monocytes Relative: 8 %
Neutro Abs: 7.2 10*3/uL (ref 1.7–7.7)
Neutrophils Relative %: 76 %
Platelets: 304 10*3/uL (ref 150–400)
RBC: 4.06 MIL/uL — ABNORMAL LOW (ref 4.22–5.81)
RDW: 18 % — ABNORMAL HIGH (ref 11.5–15.5)
WBC: 9.6 10*3/uL (ref 4.0–10.5)
nRBC: 0 % (ref 0.0–0.2)

## 2021-10-05 LAB — COMPREHENSIVE METABOLIC PANEL
ALT: 8 U/L (ref 0–44)
AST: 17 U/L (ref 15–41)
Albumin: 3.1 g/dL — ABNORMAL LOW (ref 3.5–5.0)
Alkaline Phosphatase: 49 U/L (ref 38–126)
Anion gap: 8 (ref 5–15)
BUN: 14 mg/dL (ref 6–20)
CO2: 24 mmol/L (ref 22–32)
Calcium: 9.9 mg/dL (ref 8.9–10.3)
Chloride: 109 mmol/L (ref 98–111)
Creatinine, Ser: 0.65 mg/dL (ref 0.61–1.24)
GFR, Estimated: >60 mL/min (ref >60)
Glucose, Bld: 107 mg/dL — ABNORMAL HIGH (ref 70–99)
Potassium: 2.6 mmol/L — CL (ref 3.5–5.1)
Sodium: 141 mmol/L (ref 135–145)
Total Bilirubin: 0.8 mg/dL (ref 0.3–1.2)
Total Protein: 6.9 g/dL (ref 6.5–8.1)

## 2021-10-05 LAB — MAGNESIUM: Magnesium: 1.6 mg/dL — ABNORMAL LOW (ref 1.7–2.4)

## 2021-10-05 MED ORDER — POTASSIUM CHLORIDE CRYS ER 20 MEQ PO TBCR: 40.0000 meq | EXTENDED_RELEASE_TABLET | Freq: Every day | ORAL | 3 refills | Status: DC

## 2021-10-05 NOTE — Progress Notes (Signed)
Per patient, he is currently taking 40 meq of K Dur daily.  Advised to take an additional 2 tablets today and 2 tomorrow.

## 2021-10-05 NOTE — Progress Notes (Signed)
Critical K+ 2.6 received from lab.  Dr Delton Coombes made aware.  Orders taken to send K Dur 20 meq 2 tablets daily.  Take 80 meq today only.

## 2021-10-05 NOTE — H&P (Signed)
Trevor [name]; [id number]; 04-Nov-1970   HPI Patient is a 51 year old black male who was referred to my care by Dr. Delton Coombes of oncology for Port-A-Cath insertion and possible gastrostomy tube placement.  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  This was found on EGD recently.  Patient is undergoing work-up for metastatic disease.  He states that he is able to take liquids, but solids resulted in emesis of undigested food.  He is about to undergo chemotherapy.  He is also going to see Dr. Kipp Brood of Cardiothoracic surgery for further evaluation. Past Medical History:  Diagnosis Date   Diabetes mellitus without complication ([place])    Hypertension     Past Surgical History:  Procedure Laterality Date   BIOPSY  08/26/2021   Procedure: BIOPSY;  Surgeon: Eloise Harman, DO;  Location: [facility];  Service: Endoscopy;;   ESOPHAGOGASTRODUODENOSCOPY (EGD) WITH PROPOFOL N/A 08/26/2021   Procedure: ESOPHAGOGASTRODUODENOSCOPY (EGD) WITH PROPOFOL;  Surgeon: Eloise Harman, DO;  Location: [facility];  Service: Endoscopy;  Laterality: N/A;  2:00pm    Family History  Problem Relation Age of Onset   Cancer Father        prostate   Cancer Sister    Colon cancer Neg Hx    Esophageal cancer Neg Hx     Current Outpatient Medications on File Prior to Visit  Medication Sig Dispense Refill   allopurinol (ZYLOPRIM) 300 MG tablet Take 1 tablet (300 mg total) by mouth daily. 30 tablet 5   amLODipine (NORVASC) 5 MG tablet Take 1 tablet (5 mg total) by mouth daily. 30 tablet 1   ibuprofen (ADVIL) 600 MG tablet Take 1 tablet (600 mg total) by mouth every 8 (eight) hours as needed. 90 tablet 1   magnesium oxide (MAG-OX) 400 (240 Mg) MG tablet Take 1 tablet (400 mg total) by mouth 2 (two) times daily. 60 tablet 1   Vitamin D, Ergocalciferol, (DRISDOL) 1.25 MG (50000 UNIT) CAPS capsule Take 1 capsule (50,000 Units total) by mouth every 7 (seven) days. 5  capsule 1   No current facility-administered medications on file prior to visit.    No Known Allergies  Social History   Substance and Sexual Activity  Alcohol Use Yes   Alcohol/week: 14.0 standard drinks   Types: 14 Cans of beer per week   Comment: 2- 3 16 oz beer daily.    Social History   Tobacco Use  Smoking Status Every Day   Packs/day: 0.50   Types: Cigarettes  Smokeless Tobacco Never    Review of Systems  Constitutional: Negative.   HENT: Negative.    Eyes:  Positive for blurred vision.  Respiratory: Negative.    Cardiovascular: Negative.   Gastrointestinal:  Positive for vomiting.  Genitourinary:  Positive for frequency.  Musculoskeletal: Negative.   Skin: Negative.   Neurological: Negative.   Endo/Heme/Allergies: Negative.   Psychiatric/Behavioral: Negative.     Objective   Vitals:   09/27/21 1458  BP: (!) 170/115  Pulse: (!) 121  Resp: 14  Temp: 98.1 F (36.7 C)  SpO2: 98%    Physical Exam Vitals reviewed.  Constitutional:      Appearance: Normal appearance. He is normal weight. He is not ill-appearing.  HENT:     Head: Normocephalic and atraumatic.  Cardiovascular:     Rate and Rhythm: Normal rate and regular rhythm.     Heart sounds: Normal heart sounds. No murmur heard.   No friction  rub. No gallop.  Pulmonary:     Effort: Pulmonary effort is normal. No respiratory distress.     Breath sounds: Normal breath sounds. No stridor. No wheezing, rhonchi or rales.  Abdominal:     General: Abdomen is flat. Bowel sounds are normal. There is no distension.     Palpations: Abdomen is soft. There is no mass.     Tenderness: There is no abdominal tenderness. There is no guarding or rebound.     Hernia: No hernia is present.  Skin:    General: Skin is warm and dry.  Neurological:     Mental Status: He is alert and oriented to person, place, and time.   Dr. Tomie China notes reviewed Assessment  Squamous cell carcinoma of distal  esophagus Plan  We will place Port-A-Cath in the near future for chemotherapy.  As the tumor is near obstructing in nature, patient is not a candidate for a PEG.  He would have to undergo either an open gastrostomy tube placement or IR placement.  This is dependent on whether Dr. Kipp Brood feels he is a candidate for surgical intervention.  I will defer to his judgment as to whether the gastrostomy tube should be placed.  If needed, both can be done at the same time.  The risks and benefits of both procedures including bleeding, infection, and the possibility of pneumothorax were fully explained to the patient, who gave informed consent. Addendum:  Will place portacath and jejunostomy tube on 10/10/21.

## 2021-10-06 ENCOUNTER — Encounter (HOSPITAL_COMMUNITY): Payer: Self-pay

## 2021-10-06 ENCOUNTER — Encounter (HOSPITAL_COMMUNITY)
Admission: RE | Admit: 2021-10-06 | Discharge: 2021-10-06 | Disposition: A | Discharge status: Home / Self Care | Payer: Medicaid Other | Source: Ambulatory Visit | Attending: General Surgery | Admitting: General Surgery

## 2021-10-09 ENCOUNTER — Ambulatory Visit (HOSPITAL_COMMUNITY): Payer: 59 | Admitting: Anesthesiology

## 2021-10-10 ENCOUNTER — Ambulatory Visit (HOSPITAL_COMMUNITY): Payer: 59

## 2021-10-10 ENCOUNTER — Ambulatory Visit (HOSPITAL_COMMUNITY): Payer: 59 | Admitting: Hematology

## 2021-10-10 ENCOUNTER — Other Ambulatory Visit (HOSPITAL_COMMUNITY): Payer: Self-pay | Admitting: Hematology

## 2021-10-10 ENCOUNTER — Other Ambulatory Visit (HOSPITAL_COMMUNITY): Payer: 59

## 2021-10-10 ENCOUNTER — Inpatient Hospital Stay (HOSPITAL_COMMUNITY)
Admission: AD | Admit: 2021-10-10 | Discharge: 2021-10-12 | DRG: 356 | Disposition: A | Discharge status: Home / Self Care | Payer: 59 | Source: Ambulatory Visit | Attending: General Surgery | Admitting: General Surgery

## 2021-10-10 ENCOUNTER — Other Ambulatory Visit: Payer: Self-pay

## 2021-10-10 ENCOUNTER — Encounter (HOSPITAL_COMMUNITY): Payer: Self-pay | Admitting: General Surgery

## 2021-10-10 ENCOUNTER — Encounter (HOSPITAL_COMMUNITY): Payer: 59 | Admitting: Dietician

## 2021-10-10 DIAGNOSIS — E119 Type 2 diabetes mellitus without complications: Secondary | ICD-10-CM | POA: Diagnosis not present

## 2021-10-10 DIAGNOSIS — E46 Unspecified protein-calorie malnutrition: Secondary | ICD-10-CM | POA: Diagnosis not present

## 2021-10-10 DIAGNOSIS — Z681 Body mass index (BMI) 19 or less, adult: Secondary | ICD-10-CM | POA: Diagnosis not present

## 2021-10-10 DIAGNOSIS — C159 Malignant neoplasm of esophagus, unspecified: Secondary | ICD-10-CM | POA: Diagnosis not present

## 2021-10-10 DIAGNOSIS — R69 Illness, unspecified: Secondary | ICD-10-CM | POA: Diagnosis not present

## 2021-10-10 DIAGNOSIS — K219 Gastro-esophageal reflux disease without esophagitis: Secondary | ICD-10-CM | POA: Diagnosis not present

## 2021-10-10 DIAGNOSIS — E876 Hypokalemia: Secondary | ICD-10-CM

## 2021-10-10 DIAGNOSIS — E43 Unspecified severe protein-calorie malnutrition: Secondary | ICD-10-CM | POA: Diagnosis not present

## 2021-10-10 DIAGNOSIS — Z452 Encounter for adjustment and management of vascular access device: Secondary | ICD-10-CM | POA: Diagnosis not present

## 2021-10-10 DIAGNOSIS — Z79899 Other long term (current) drug therapy: Secondary | ICD-10-CM | POA: Diagnosis not present

## 2021-10-10 DIAGNOSIS — F17210 Nicotine dependence, cigarettes, uncomplicated: Secondary | ICD-10-CM | POA: Diagnosis present

## 2021-10-10 DIAGNOSIS — I10 Essential (primary) hypertension: Secondary | ICD-10-CM | POA: Diagnosis present

## 2021-10-10 DIAGNOSIS — C155 Malignant neoplasm of lower third of esophagus: Secondary | ICD-10-CM | POA: Diagnosis not present

## 2021-10-10 DIAGNOSIS — Z8042 Family history of malignant neoplasm of prostate: Secondary | ICD-10-CM | POA: Diagnosis not present

## 2021-10-10 DIAGNOSIS — Z7984 Long term (current) use of oral hypoglycemic drugs: Secondary | ICD-10-CM

## 2021-10-10 LAB — GLUCOSE, CAPILLARY
Glucose-Capillary: 92 mg/dL (ref 70–99)
Glucose-Capillary: 95 mg/dL (ref 70–99)
Glucose-Capillary: 94 mg/dL (ref 70–99)
Glucose-Capillary: 125 mg/dL — ABNORMAL HIGH (ref 70–99)

## 2021-10-10 LAB — POCT I-STAT, CHEM 8
BUN: 14 mg/dL (ref 6–20)
Calcium, Ion: 1.1 mmol/L — ABNORMAL LOW (ref 1.15–1.40)
Chloride: 103 mmol/L (ref 98–111)
Creatinine, Ser: 0.5 mg/dL — ABNORMAL LOW (ref 0.61–1.24)
Glucose, Bld: 90 mg/dL (ref 70–99)
HCT: 31 % — ABNORMAL LOW (ref 39.0–52.0)
Hemoglobin: 10.5 g/dL — ABNORMAL LOW (ref 13.0–17.0)
Potassium: 2.6 mmol/L — CL (ref 3.5–5.1)
Sodium: 138 mmol/L (ref 135–145)
TCO2: 25 mmol/L (ref 22–32)

## 2021-10-10 LAB — POTASSIUM: Potassium: 3.3 mmol/L — ABNORMAL LOW (ref 3.5–5.1)

## 2021-10-10 MED ORDER — PANTOPRAZOLE SODIUM 40 MG IV SOLR
40.0000 mg | Freq: Every day | INTRAVENOUS | Status: DC
  Administered 2021-10-10 – 2021-10-11 (×2): 40 mg via INTRAVENOUS
  Filled 2021-10-10 (×2): qty 10

## 2021-10-10 MED ORDER — CHLORHEXIDINE GLUCONATE CLOTH 2 % EX PADS: 6.0000 | MEDICATED_PAD | Freq: Once | CUTANEOUS | Status: DC

## 2021-10-10 MED ORDER — ONDANSETRON HCL 4 MG/2ML IJ SOLN: 4.0000 mg | Freq: Four times a day (QID) | INTRAMUSCULAR | Status: DC | PRN

## 2021-10-10 MED ORDER — KCL IN DEXTROSE-NACL 40-5-0.45 MEQ/L-%-% IV SOLN: INTRAVENOUS | Status: DC

## 2021-10-10 MED ORDER — MAGNESIUM SULFATE 2 GM/50ML IV SOLN
2.0000 g | Freq: Once | INTRAVENOUS | Status: AC
  Administered 2021-10-10: 2 g via INTRAVENOUS
  Filled 2021-10-10: qty 50

## 2021-10-10 MED ORDER — INSULIN ASPART 100 UNIT/ML IJ SOLN: 0.0000 [IU] | Freq: Three times a day (TID) | INTRAMUSCULAR | Status: DC

## 2021-10-10 MED ORDER — LACTATED RINGERS IV SOLN: INTRAVENOUS | Status: DC

## 2021-10-10 MED ORDER — CEFAZOLIN SODIUM-DEXTROSE 2-4 GM/100ML-% IV SOLN: 2.0000 g | INTRAVENOUS | Status: DC

## 2021-10-10 MED ORDER — HYDROMORPHONE HCL 1 MG/ML IJ SOLN: 1.0000 mg | INTRAMUSCULAR | Status: DC | PRN

## 2021-10-10 MED ORDER — CHLORHEXIDINE GLUCONATE 0.12 % MT SOLN
15.0000 mL | Freq: Once | OROMUCOSAL | Status: AC
  Administered 2021-10-10: 15 mL via OROMUCOSAL

## 2021-10-10 MED ORDER — ENSURE ENLIVE PO LIQD
237.0000 mL | Freq: Four times a day (QID) | ORAL | Status: DC
  Administered 2021-10-11: 237 mL via ORAL

## 2021-10-10 MED ORDER — ENOXAPARIN SODIUM 40 MG/0.4ML IJ SOSY
40.0000 mg | PREFILLED_SYRINGE | INTRAMUSCULAR | Status: DC
  Administered 2021-10-10: 40 mg via SUBCUTANEOUS
  Filled 2021-10-10 (×2): qty 0.4

## 2021-10-10 MED ORDER — ONDANSETRON 4 MG PO TBDP: 4.0000 mg | ORAL_TABLET | Freq: Four times a day (QID) | ORAL | Status: DC | PRN

## 2021-10-10 MED ORDER — NICOTINE 21 MG/24HR TD PT24
21.0000 mg | MEDICATED_PATCH | Freq: Every day | TRANSDERMAL | Status: DC
Start: 2021-10-10 — End: 2021-10-12
  Administered 2021-10-10 – 2021-10-12 (×2): 21 mg via TRANSDERMAL
  Filled 2021-10-10 (×3): qty 1

## 2021-10-10 MED ORDER — BOOST PLUS PO LIQD
237.0000 mL | Freq: Three times a day (TID) | ORAL | Status: DC
Start: 2021-10-10 — End: 2021-10-10
  Filled 2021-10-10 (×3): qty 237

## 2021-10-10 MED ORDER — SIMETHICONE 80 MG PO CHEW: 40.0000 mg | CHEWABLE_TABLET | Freq: Four times a day (QID) | ORAL | Status: DC | PRN

## 2021-10-10 MED ORDER — CEFAZOLIN SODIUM-DEXTROSE 2-4 GM/100ML-% IV SOLN
INTRAVENOUS | Status: AC
  Filled 2021-10-10: qty 100

## 2021-10-10 MED ORDER — ORAL CARE MOUTH RINSE: 15.0000 mL | Freq: Once | OROMUCOSAL | Status: AC

## 2021-10-10 MED ORDER — ACETAMINOPHEN 650 MG RE SUPP: 650.0000 mg | Freq: Four times a day (QID) | RECTAL | Status: DC | PRN

## 2021-10-10 MED ORDER — POTASSIUM CHLORIDE 10 MEQ/100ML IV SOLN
10.0000 meq | INTRAVENOUS | Status: AC
  Administered 2021-10-10 (×6): 10 meq via INTRAVENOUS
  Filled 2021-10-10 (×6): qty 100

## 2021-10-10 MED ORDER — METOPROLOL TARTRATE 5 MG/5ML IV SOLN
2.5000 mg | Freq: Four times a day (QID) | INTRAVENOUS | Status: DC
  Administered 2021-10-10 – 2021-10-12 (×8): 2.5 mg via INTRAVENOUS
  Filled 2021-10-10 (×8): qty 5

## 2021-10-10 MED ORDER — ACETAMINOPHEN 325 MG PO TABS
650.0000 mg | ORAL_TABLET | Freq: Four times a day (QID) | ORAL | Status: DC | PRN
  Administered 2021-10-11: 650 mg via ORAL
  Filled 2021-10-10: qty 2

## 2021-10-10 NOTE — Progress Notes (Signed)
Patient still with severe hypokalemia 2.6 despite taking oral potassium supplements.  He has diarrhea from Ensure and a near obstructing esophageal carcinoma.  Will admit the patient to the hospital to correct his hypokalemia.  Surgery has been rescheduled for 10/12/2021.

## 2021-10-10 NOTE — Anesthesia Preprocedure Evaluation (Signed)
Anesthesia Evaluation  Patient identified by MRN, date of birth, ID band Patient awake    Reviewed: Allergy & Precautions, NPO status , Patient's Chart, lab work & pertinent test results  History of Anesthesia Complications Negative for: history of anesthetic complications  Airway Mallampati: II  TM Distance: >3 FB Neck ROM: Full    Dental  (+) Poor Dentition, Missing, Loose, Dental Advisory Given,    Pulmonary Current Smoker and Patient abstained from smoking.,    Pulmonary exam normal breath sounds clear to auscultation       Cardiovascular hypertension, Pt. on medications negative cardio ROS Normal cardiovascular exam Rhythm:Regular Rate:Normal     Neuro/Psych negative neurological ROS  negative psych ROS   GI/Hepatic GERD  Medicated,(+)     substance abuse  alcohol use, Squamous cell esophageal cancer (HCC)   Endo/Other  diabetes, Well Controlled, Type 2, Oral Hypoglycemic Agents  Renal/GU negative Renal ROS  negative genitourinary   Musculoskeletal negative musculoskeletal ROS (+)   Abdominal   Peds negative pediatric ROS (+)  Hematology negative hematology ROS (+)   Anesthesia Other Findings Hypokalemia  Reproductive/Obstetrics negative OB ROS                             Anesthesia Physical Anesthesia Plan  ASA: 3  Anesthesia Plan: General   Post-op Pain Management: Dilaudid IV   Induction: Intravenous  PONV Risk Score and Plan: 3 and Ondansetron  Airway Management Planned: Oral ETT  Additional Equipment:   Intra-op Plan:   Post-operative Plan:   Informed Consent: I have reviewed the patients History and Physical, chart, labs and discussed the procedure including the risks, benefits and alternatives for the proposed anesthesia with the patient or authorized representative who has indicated his/her understanding and acceptance.     Dental advisory  given  Plan Discussed with: Surgeon and CRNA  Anesthesia Plan Comments:         Anesthesia Quick Evaluation

## 2021-10-10 NOTE — Progress Notes (Signed)
Initial Nutrition Assessment  DOCUMENTATION CODES:   Severe malnutrition in context of chronic illness  INTERVENTION:  Ensure Enlive po QID, each supplement provides 350 kcal and 20 grams of protein. Once long-term nutrition access has been placed, recommend: Starting 1/2 carton of Osmolite 1.5 BID, advancing to 1 carton as tolerated, with a total daily goal of 6 cartons per day (1422 ml per day) Provides 2130 kcal, 89 gm protein, 1086 ml free water daily Once enteral nutrition is initiated, monitor magnesium, potassium, and phosphorus BID for at least 3 days, MD to replete as needed, as pt is at risk for refeeding syndrome  Consider adding soluble fiber supplement add bulk if pt continues with diarrhea  NUTRITION DIAGNOSIS:   Severe Malnutrition related to chronic illness (esophageal carcinoma, alcohol use, tobacco use) as evidenced by moderate fat depletion, severe muscle depletion, percent weight loss.  GOAL:   Patient will meet greater than or equal to 90% of their needs  MONITOR:   PO intake, Supplement acceptance, Labs, Weight trends, TF tolerance  REASON FOR ASSESSMENT:   Diagnosis, Other (Comment) (plans for PEG)    ASSESSMENT:   Pt admitted for medical management of hypokalemia and placement of permanent feeding access d/t near obstructing esophageal carcinoma. PMH significant for HTN, tachycardia, gout, GERD, alcohol use, tobacco use, Vitamin D deficiency and dysphagia.   Pt pleased to see RD. He is followed by RD at outpatient cancer center who has been very helpful in making nutrition recommendations. He states that since starting Ensure 6 times per day, he has experienced diarrhea which he reports is leading to his hypokalemia. 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 He reports good mobility, as he walks to work. He typically works 6A-3P. He reports drinking 2 Ensure when he gets home and again around around [place] and then 2 more before bed. It is unclear over  the time frame that it takes him to consume 2 Ensure, however discussed with him that this is a high volume of nutrition to consume at one time and he may have a decrease in loose stools, having 1 every 2-3 hours instead. He is agreeable to trying this to test for improvement in symptoms. We also discussed adding Banatrol if symptoms continue once he is home, as this is a soluble fiber supplements and may help bulk his stool. Informed him, he can obtain samples from outpatient RD after discharge.   Pt states that he has had very limited oral intake within the last month d/t esophageal carcinoma. He is able to tolerate muscle milk, broth, and mashed potatoes. Pt is agreeable to try Boost Plus during admission, however Boost is unavailable per nursing documentation. Will place order for Ensure to be provided between meals to determine if having 1 Ensure every 2-3 hours will increase tolerance.    He endorses significant wt loss within the last month. He reports a prior wt of ~176 lbs and a current wt of 118 lbs. Reviewed wt hx. Noted a 29.7% wt loss from 02/06-06/29 which is clinically significant for time frame.   Noted plans for long-term nutrition access placement on Wednesday. Will follow up with pt as appropriate to ensure tolerance and proper education provided.   Medications: SSI 0-9 units TID, IV protonix IV drips: abx, D5 and NaCl with KCl '@50ml'$ /hr  Labs: potassium 2.6 (L), Cr 0.50, ionized Ca 1.10  NUTRITION - FOCUSED PHYSICAL EXAM:  Flowsheet Row Most Recent Value  Orbital Region Moderate depletion  Upper  Arm Region Severe depletion  Thoracic and Lumbar Region Moderate depletion  Buccal Region Moderate depletion  Temple Region Severe depletion  Clavicle Bone Region Severe depletion  Clavicle and Acromion Bone Region Severe depletion  Scapular Bone Region Severe depletion  Dorsal Hand Moderate depletion  Patellar Region Severe depletion  Anterior Thigh Region Severe depletion   Posterior Calf Region Moderate depletion  Edema (RD Assessment) None  Hair Reviewed  Eyes Reviewed  Mouth Reviewed  Skin Reviewed  Nails Reviewed       Diet Order:   Diet Order             DIET DYS 3 Room service appropriate? Yes; Fluid consistency: Thin  Diet effective now                   EDUCATION NEEDS:   Education needs have been addressed  Skin:  Skin Assessment: Reviewed RN Assessment  Last BM:  6/19  Height:   Ht Readings from Last 1 Encounters:  10/10/21 '5\' 6"'$  (1.676 m)    Weight:   Wt Readings from Last 1 Encounters:  10/10/21 53.6 kg   BMI:  Body mass index is 19.08 kg/m.  Estimated Nutritional Needs:   Kcal:  2000-2200  Protein:  95-110g  Fluid:  >/=2L  Clayborne Dana, RDN, LDN Clinical Nutrition

## 2021-10-10 NOTE — Progress Notes (Signed)
START OFF PATHWAY REGIMEN - Gastroesophageal   [id number] q14 Days + Nivolumab 240 mg IV D1 q14 Days:   A cycle is every 14 days:     Nivolumab      Oxaliplatin      Leucovorin      Fluorouracil      Fluorouracil   **Always confirm dose/schedule in your pharmacy ordering system**  Patient Characteristics: Distant Metastases (cM1/pM1) / Locally Recurrent Disease, Squamous Cell, Esophageal & GE Junction, First Line, PD?L1 Expression  CPS < 1/Negative/Unknown, No Prior Taxane Therapeutic Status: Distant Metastases (No Additional Staging) Histology: Squamous Cell Disease Classification: Esophageal Line of Therapy: First Line PD-L1 Expression Status: Awaiting Test Results Taxane Status: No Prior Taxane Intent of Therapy: Non-Curative / Palliative Intent, Discussed with Patient

## 2021-10-11 DIAGNOSIS — E43 Unspecified severe protein-calorie malnutrition: Secondary | ICD-10-CM | POA: Diagnosis not present

## 2021-10-11 DIAGNOSIS — E876 Hypokalemia: Secondary | ICD-10-CM | POA: Diagnosis not present

## 2021-10-11 DIAGNOSIS — C159 Malignant neoplasm of esophagus, unspecified: Secondary | ICD-10-CM | POA: Diagnosis not present

## 2021-10-11 LAB — BASIC METABOLIC PANEL
Anion gap: 4 — ABNORMAL LOW (ref 5–15)
BUN: 13 mg/dL (ref 6–20)
CO2: 25 mmol/L (ref 22–32)
Calcium: 8.9 mg/dL (ref 8.9–10.3)
Chloride: 106 mmol/L (ref 98–111)
Creatinine, Ser: 0.51 mg/dL — ABNORMAL LOW (ref 0.61–1.24)
GFR, Estimated: >60 mL/min (ref >60)
Glucose, Bld: 115 mg/dL — ABNORMAL HIGH (ref 70–99)
Potassium: 3.1 mmol/L — ABNORMAL LOW (ref 3.5–5.1)
Sodium: 135 mmol/L (ref 135–145)

## 2021-10-11 LAB — GLUCOSE, CAPILLARY
Glucose-Capillary: 113 mg/dL — ABNORMAL HIGH (ref 70–99)
Glucose-Capillary: 121 mg/dL — ABNORMAL HIGH (ref 70–99)
Glucose-Capillary: 102 mg/dL — ABNORMAL HIGH (ref 70–99)
Glucose-Capillary: 116 mg/dL — ABNORMAL HIGH (ref 70–99)

## 2021-10-11 LAB — CBC
HCT: 29 % — ABNORMAL LOW (ref 39.0–52.0)
Hemoglobin: 9.3 g/dL — ABNORMAL LOW (ref 13.0–17.0)
MCH: 24 pg — ABNORMAL LOW (ref 26.0–34.0)
MCHC: 32.1 g/dL (ref 30.0–36.0)
MCV: 74.7 fL — ABNORMAL LOW (ref 80.0–100.0)
Platelets: 239 10*3/uL (ref 150–400)
RBC: 3.88 MIL/uL — ABNORMAL LOW (ref 4.22–5.81)
RDW: 17.4 % — ABNORMAL HIGH (ref 11.5–15.5)
WBC: 13.4 10*3/uL — ABNORMAL HIGH (ref 4.0–10.5)
nRBC: 0 % (ref 0.0–0.2)

## 2021-10-11 LAB — MAGNESIUM: Magnesium: 1.6 mg/dL — ABNORMAL LOW (ref 1.7–2.4)

## 2021-10-11 LAB — PHOSPHORUS: Phosphorus: 1.4 mg/dL — ABNORMAL LOW (ref 2.5–4.6)

## 2021-10-11 MED ORDER — CHLORHEXIDINE GLUCONATE CLOTH 2 % EX PADS: 6.0000 | MEDICATED_PAD | Freq: Once | CUTANEOUS | Status: DC

## 2021-10-11 MED ORDER — POTASSIUM PHOSPHATES 15 MMOLE/5ML IV SOLN
20.0000 mmol | Freq: Once | INTRAVENOUS | Status: AC
  Administered 2021-10-11: 20 mmol via INTRAVENOUS
  Filled 2021-10-11: qty 6.67

## 2021-10-11 MED ORDER — POTASSIUM CHLORIDE 10 MEQ/100ML IV SOLN
10.0000 meq | INTRAVENOUS | Status: AC
  Administered 2021-10-11 (×4): 10 meq via INTRAVENOUS
  Filled 2021-10-11 (×4): qty 100

## 2021-10-11 MED ORDER — CHLORHEXIDINE GLUCONATE CLOTH 2 % EX PADS
6.0000 | MEDICATED_PAD | Freq: Once | CUTANEOUS | Status: AC
  Administered 2021-10-11: 6 via TOPICAL

## 2021-10-11 MED ORDER — MAGNESIUM SULFATE 2 GM/50ML IV SOLN
2.0000 g | Freq: Once | INTRAVENOUS | Status: AC
Start: 2021-10-11 — End: 2021-10-11
  Administered 2021-10-11: 2 g via INTRAVENOUS
  Filled 2021-10-11: qty 50

## 2021-10-11 NOTE — Progress Notes (Signed)
MEDICATION RELATED CONSULT NOTE - INITIAL   Pharmacy Consult for hypophosphatemia    No Known Allergies  Patient Measurements: Height: '5\' 6"'$  (167.6 cm) Weight: 53.6 kg (118 lb 3.2 oz) IBW/kg (Calculated) : 63.8   Vital Signs: Temp: 97.9 F (36.6 Trevor) (06/20 0551) Temp Source: Oral (06/20 0551) BP: 159/101 (06/20 0551) Pulse Rate: 87 (06/20 0551) Intake/Output from previous day: 06/19 0701 - 06/20 0700 In: 2072.1 [P.O.:600; I.V.:866; IV Piggyback:606.1] Out: 500 [Urine:500] Intake/Output from this shift: No intake/output data recorded.  Labs: Recent Labs    10/10/21 0849 10/11/21 0458  WBC  --  13.4*  HGB 10.5* 9.3*  HCT 31.0* 29.0*  PLT  --  239  CREATININE 0.50* 0.51*  MG  --  1.6*  PHOS  --  1.4*   Estimated Creatinine Clearance: 81 mL/min (A) (by Trevor-G formula based on SCr of 0.51 mg/dL (L)).   Microbiology: No results found for this or any previous visit (from the past 720 hour(s)).  Medical History: Past Medical History:  Diagnosis Date   Diabetes mellitus without complication ([place])    Hypertension     Medications:  Medications Prior to Admission  Medication Sig Dispense Refill Last Dose   allopurinol (ZYLOPRIM) 300 MG tablet Take 1 tablet (300 mg total) by mouth daily. 30 tablet 5 10/09/2021   amLODipine (NORVASC) 5 MG tablet Take 1 tablet (5 mg total) by mouth daily. 30 tablet 1 10/09/2021   ibuprofen (ADVIL) 600 MG tablet Take 1 tablet (600 mg total) by mouth every 8 (eight) hours as needed. 90 tablet 1    magnesium oxide (MAG-OX) 400 (240 Mg) MG tablet Take 1 tablet (400 mg total) by mouth 2 (two) times daily. 60 tablet 1 10/09/2021   Vitamin D, Ergocalciferol, (DRISDOL) 1.25 MG (50000 UNIT) CAPS capsule Take 1 capsule (50,000 Units total) by mouth every 7 (seven) days. (Patient taking differently: Take 50,000 Units by mouth every Monday.) 5 capsule 1    fluorouracil CALGB 41324 2,400 mg/m2 in sodium chloride 0.9 % 150 mL Inject 2,400 mg/m2 into the vein  over 48 hr. Every 2 weeks      FLUOROURACIL IV Inject into the vein every 14 (fourteen) days.      LEUCOVORIN CALCIUM IV Inject into the vein every 14 (fourteen) days.      loperamide (IMODIUM) 2 MG capsule Take 1 capsule (2 mg total) by mouth as needed for diarrhea or loose stools (take as directed according to the instruction sheet you were given). (Patient not taking: Reported on 10/04/2021) 30 capsule 4 Not Taking   OXALIPLATIN IV Inject into the vein every 14 (fourteen) days.      potassium chloride SA (KLOR-CON M) 20 MEQ tablet Take 2 tablets (40 mEq total) by mouth daily. 60 tablet 3    prochlorperazine (COMPAZINE) 10 MG tablet Take 1 tablet (10 mg total) by mouth every 6 (six) hours as needed for nausea or vomiting. (Patient not taking: Reported on 10/04/2021) 30 tablet 6 Not Taking     Plan:  Phos 1.4 Potassium phosphate 20 mmol x 1 dose F/U AM phos  Trevor [name] Trevor [name] 10/11/2021,8:55 AM

## 2021-10-11 NOTE — Progress Notes (Signed)
*   Surgery Date in Future *  Subjective: Patient has no complaints.  Objective: Vital signs in last 24 hours: Temp:  [97.6 F (36.4 C)-97.9 F (36.6 C)] 97.9 F (36.6 C) (06/20 0551) Pulse Rate:  [68-108] 87 (06/20 0551) Resp:  [17-20] 17 (06/20 0551) BP: (146-183)/(87-106) 159/101 (06/20 0551) SpO2:  [100 %] 100 % (06/20 0551) Weight:  [53.6 kg-57.6 kg] 53.6 kg (06/19 0958) Last BM Date : 10/10/21  Intake/Output from previous day: 06/19 0701 - 06/20 0700 In: 2072.1 [P.O.:600; I.V.:866; IV Piggyback:606.1] Out: 500 [Urine:500] Intake/Output this shift: No intake/output data recorded.  General appearance: alert, cooperative, and no distress Resp: clear to auscultation bilaterally Cardio: regular rate and rhythm, S1, S2 normal, no murmur, click, rub or gallop GI: soft, non-tender; bowel sounds normal; no masses,  no organomegaly  Lab Results:  Recent Labs    10/10/21 0849 10/11/21 0458  WBC  --  13.4*  HGB 10.5* 9.3*  HCT 31.0* 29.0*  PLT  --  239   BMET Recent Labs    10/10/21 0849 10/10/21 2034 10/11/21 0458  NA 138  --  135  K 2.6* 3.3* 3.1*  CL 103  --  106  CO2  --   --  25  GLUCOSE 90  --  115*  BUN 14  --  13  CREATININE 0.50*  --  0.51*  CALCIUM  --   --  8.9   PT/INR No results for input(s): "LABPROT", "INR" in the last 72 hours.  Studies/Results: No results found.  Anti-infectives: Anti-infectives (From admission, onward)    Start     Dose/Rate Route Frequency Ordered Stop   10/10/21 0830  ceFAZolin (ANCEF) IVPB 2g/100 mL premix  Status:  Discontinued        2 g 200 mL/hr over 30 Minutes Intravenous On call to O.R. 10/10/21 5465 10/10/21 0953   10/10/21 0823  ceFAZolin (ANCEF) 2-4 GM/100ML-% IVPB       Note to Pharmacy: Charm Barges S: cabinet override      10/10/21 0823 10/10/21 2029       Assessment/Plan: Impression: Obstructing esophageal carcinoma.  Hypokalemia resolving.  Magnesium and phosphorus levels pending. Plan: We  will supplement potassium today.  For Port-A-Cath placement, gastrostomy tube placement tomorrow.  The risks and benefits of the procedure were fully explained to the patient, who gave informed consent.  LOS: 1 day    Aviva Signs 10/11/2021

## 2021-10-11 NOTE — Plan of Care (Signed)
  Problem: Education: Goal: Knowledge of General Education information will improve Description Including pain rating scale, medication(s)/side effects and non-pharmacologic comfort measures Outcome: Progressing   Problem: Health Behavior/Discharge Planning: Goal: Ability to manage health-related needs will improve Outcome: Progressing   

## 2021-10-12 ENCOUNTER — Encounter (HOSPITAL_COMMUNITY)
Admission: AD | Disposition: A | Discharge status: Home / Self Care | Payer: Self-pay | Source: Ambulatory Visit | Attending: General Surgery

## 2021-10-12 ENCOUNTER — Encounter (HOSPITAL_COMMUNITY): Payer: Self-pay | Admitting: General Surgery

## 2021-10-12 ENCOUNTER — Inpatient Hospital Stay (HOSPITAL_COMMUNITY): Payer: 59

## 2021-10-12 ENCOUNTER — Inpatient Hospital Stay (HOSPITAL_COMMUNITY): Payer: 59 | Admitting: Anesthesiology

## 2021-10-12 ENCOUNTER — Other Ambulatory Visit: Payer: Self-pay

## 2021-10-12 ENCOUNTER — Encounter (HOSPITAL_COMMUNITY): Payer: 59

## 2021-10-12 ENCOUNTER — Other Ambulatory Visit (HOSPITAL_COMMUNITY): Payer: Self-pay

## 2021-10-12 ENCOUNTER — Other Ambulatory Visit: Payer: Self-pay | Admitting: Radiology

## 2021-10-12 ENCOUNTER — Other Ambulatory Visit (HOSPITAL_COMMUNITY): Payer: Self-pay | Admitting: Physician Assistant

## 2021-10-12 DIAGNOSIS — R918 Other nonspecific abnormal finding of lung field: Secondary | ICD-10-CM | POA: Diagnosis not present

## 2021-10-12 DIAGNOSIS — C159 Malignant neoplasm of esophagus, unspecified: Secondary | ICD-10-CM

## 2021-10-12 DIAGNOSIS — E46 Unspecified protein-calorie malnutrition: Secondary | ICD-10-CM | POA: Diagnosis not present

## 2021-10-12 DIAGNOSIS — I10 Essential (primary) hypertension: Secondary | ICD-10-CM

## 2021-10-12 DIAGNOSIS — Z452 Encounter for adjustment and management of vascular access device: Secondary | ICD-10-CM

## 2021-10-12 DIAGNOSIS — M47814 Spondylosis without myelopathy or radiculopathy, thoracic region: Secondary | ICD-10-CM | POA: Diagnosis not present

## 2021-10-12 DIAGNOSIS — F17210 Nicotine dependence, cigarettes, uncomplicated: Secondary | ICD-10-CM

## 2021-10-12 DIAGNOSIS — E43 Unspecified severe protein-calorie malnutrition: Secondary | ICD-10-CM | POA: Diagnosis not present

## 2021-10-12 DIAGNOSIS — R69 Illness, unspecified: Secondary | ICD-10-CM | POA: Diagnosis not present

## 2021-10-12 HISTORY — PX: GASTROSTOMY: SHX5249

## 2021-10-12 HISTORY — PX: PORTACATH PLACEMENT: SHX2246

## 2021-10-12 LAB — CBC
HCT: 30.6 % — ABNORMAL LOW (ref 39.0–52.0)
Hemoglobin: 10 g/dL — ABNORMAL LOW (ref 13.0–17.0)
MCH: 24.2 pg — ABNORMAL LOW (ref 26.0–34.0)
MCHC: 32.7 g/dL (ref 30.0–36.0)
MCV: 74.1 fL — ABNORMAL LOW (ref 80.0–100.0)
Platelets: 274 10*3/uL (ref 150–400)
RBC: 4.13 MIL/uL — ABNORMAL LOW (ref 4.22–5.81)
RDW: 17.3 % — ABNORMAL HIGH (ref 11.5–15.5)
WBC: 13.5 10*3/uL — ABNORMAL HIGH (ref 4.0–10.5)
nRBC: 0 % (ref 0.0–0.2)

## 2021-10-12 LAB — BASIC METABOLIC PANEL
Anion gap: 7 (ref 5–15)
BUN: 8 mg/dL (ref 6–20)
CO2: 21 mmol/L — ABNORMAL LOW (ref 22–32)
Calcium: 8.9 mg/dL (ref 8.9–10.3)
Chloride: 104 mmol/L (ref 98–111)
Creatinine, Ser: 0.5 mg/dL — ABNORMAL LOW (ref 0.61–1.24)
GFR, Estimated: >60 mL/min (ref >60)
Glucose, Bld: 99 mg/dL (ref 70–99)
Potassium: 3.7 mmol/L (ref 3.5–5.1)
Sodium: 132 mmol/L — ABNORMAL LOW (ref 135–145)

## 2021-10-12 LAB — GLUCOSE, CAPILLARY: Glucose-Capillary: 102 mg/dL — ABNORMAL HIGH (ref 70–99)

## 2021-10-12 LAB — PHOSPHORUS: Phosphorus: 2.1 mg/dL — ABNORMAL LOW (ref 2.5–4.6)

## 2021-10-12 SURGERY — INSERTION, TUNNELED CENTRAL VENOUS DEVICE, WITH PORT
Anesthesia: General

## 2021-10-12 SURGERY — INSERTION, TUNNELED CENTRAL VENOUS DEVICE, WITH PORT
Anesthesia: General | Site: Chest

## 2021-10-12 MED ORDER — ORAL CARE MOUTH RINSE: 15.0000 mL | Freq: Once | OROMUCOSAL | Status: AC

## 2021-10-12 MED ORDER — PROPOFOL 10 MG/ML IV BOLUS
INTRAVENOUS | Status: DC | PRN
  Administered 2021-10-12: 120 mg via INTRAVENOUS

## 2021-10-12 MED ORDER — OXYCODONE HCL 5 MG PO TABS: 5.0000 mg | ORAL_TABLET | ORAL | Status: DC | PRN

## 2021-10-12 MED ORDER — FENTANYL CITRATE (PF) 100 MCG/2ML IJ SOLN
INTRAMUSCULAR | Status: AC
  Filled 2021-10-12: qty 2

## 2021-10-12 MED ORDER — ROCURONIUM BROMIDE 10 MG/ML (PF) SYRINGE
PREFILLED_SYRINGE | INTRAVENOUS | Status: DC | PRN
  Administered 2021-10-12: 50 mg via INTRAVENOUS

## 2021-10-12 MED ORDER — ALLOPURINOL 300 MG PO TABS
300.0000 mg | ORAL_TABLET | Freq: Every day | ORAL | Status: DC
  Administered 2021-10-12: 300 mg via ORAL
  Filled 2021-10-12: qty 1

## 2021-10-12 MED ORDER — DEXAMETHASONE SODIUM PHOSPHATE 10 MG/ML IJ SOLN
INTRAMUSCULAR | Status: AC
  Filled 2021-10-12: qty 1

## 2021-10-12 MED ORDER — MIDAZOLAM HCL 2 MG/2ML IJ SOLN
INTRAMUSCULAR | Status: AC
  Filled 2021-10-12: qty 2

## 2021-10-12 MED ORDER — ONDANSETRON HCL 4 MG/2ML IJ SOLN
INTRAMUSCULAR | Status: DC | PRN
  Administered 2021-10-12: 4 mg via INTRAVENOUS

## 2021-10-12 MED ORDER — POVIDONE-IODINE 10 % EX OINT
TOPICAL_OINTMENT | CUTANEOUS | Status: AC
  Filled 2021-10-12: qty 1

## 2021-10-12 MED ORDER — LIDOCAINE HCL (PF) 2 % IJ SOLN
INTRAMUSCULAR | Status: AC
  Filled 2021-10-12: qty 5

## 2021-10-12 MED ORDER — POVIDONE-IODINE 10 % EX OINT
TOPICAL_OINTMENT | CUTANEOUS | Status: DC | PRN
  Administered 2021-10-12: 1 via TOPICAL

## 2021-10-12 MED ORDER — LIDOCAINE HCL (PF) 1 % IJ SOLN
INTRAMUSCULAR | Status: AC
  Filled 2021-10-12: qty 30

## 2021-10-12 MED ORDER — ONDANSETRON HCL 4 MG/2ML IJ SOLN
INTRAMUSCULAR | Status: AC
  Filled 2021-10-12: qty 2

## 2021-10-12 MED ORDER — DEXAMETHASONE SODIUM PHOSPHATE 10 MG/ML IJ SOLN
INTRAMUSCULAR | Status: DC | PRN
  Administered 2021-10-12: 10 mg via INTRAVENOUS

## 2021-10-12 MED ORDER — SODIUM CHLORIDE 0.9 % IR SOLN
Status: DC | PRN
  Administered 2021-10-12: 1000 mL

## 2021-10-12 MED ORDER — SUGAMMADEX SODIUM 200 MG/2ML IV SOLN
INTRAVENOUS | Status: DC | PRN
  Administered 2021-10-12: 200 mg via INTRAVENOUS

## 2021-10-12 MED ORDER — PHENYLEPHRINE 80 MCG/ML (10ML) SYRINGE FOR IV PUSH (FOR BLOOD PRESSURE SUPPORT)
PREFILLED_SYRINGE | INTRAVENOUS | Status: DC | PRN
  Administered 2021-10-12 (×2): 80 ug via INTRAVENOUS

## 2021-10-12 MED ORDER — CEFAZOLIN SODIUM-DEXTROSE 2-4 GM/100ML-% IV SOLN
INTRAVENOUS | Status: AC
  Filled 2021-10-12: qty 100

## 2021-10-12 MED ORDER — ROCURONIUM BROMIDE 10 MG/ML (PF) SYRINGE
PREFILLED_SYRINGE | INTRAVENOUS | Status: AC
  Filled 2021-10-12: qty 10

## 2021-10-12 MED ORDER — MIDAZOLAM HCL 5 MG/5ML IJ SOLN
INTRAMUSCULAR | Status: DC | PRN
  Administered 2021-10-12: 2 mg via INTRAVENOUS

## 2021-10-12 MED ORDER — PROPOFOL 10 MG/ML IV BOLUS
INTRAVENOUS | Status: AC
  Filled 2021-10-12: qty 20

## 2021-10-12 MED ORDER — HEPARIN SOD (PORK) LOCK FLUSH 100 UNIT/ML IV SOLN
INTRAVENOUS | Status: DC | PRN
  Administered 2021-10-12: 400 [IU] via INTRAVENOUS

## 2021-10-12 MED ORDER — BUPIVACAINE LIPOSOME 1.3 % IJ SUSP
INTRAMUSCULAR | Status: DC | PRN
  Administered 2021-10-12: 10 mL

## 2021-10-12 MED ORDER — SODIUM CHLORIDE 0.9 % IV SOLN: INTRAVENOUS | Status: DC

## 2021-10-12 MED ORDER — LACTATED RINGERS IV SOLN
INTRAVENOUS | Status: DC
  Administered 2021-10-12: 1000 mL via INTRAVENOUS

## 2021-10-12 MED ORDER — HEPARIN SOD (PORK) LOCK FLUSH 100 UNIT/ML IV SOLN
INTRAVENOUS | Status: AC
  Filled 2021-10-12: qty 5

## 2021-10-12 MED ORDER — SODIUM CHLORIDE (PF) 0.9 % IJ SOLN
INTRAMUSCULAR | Status: DC | PRN
  Administered 2021-10-12: 500 mL via INTRAVENOUS

## 2021-10-12 MED ORDER — PHENYLEPHRINE 80 MCG/ML (10ML) SYRINGE FOR IV PUSH (FOR BLOOD PRESSURE SUPPORT)
PREFILLED_SYRINGE | INTRAVENOUS | Status: AC
  Filled 2021-10-12: qty 20

## 2021-10-12 MED ORDER — CHLORHEXIDINE GLUCONATE 0.12 % MT SOLN
15.0000 mL | Freq: Once | OROMUCOSAL | Status: AC
  Administered 2021-10-12: 15 mL via OROMUCOSAL

## 2021-10-12 MED ORDER — AMLODIPINE BESYLATE 5 MG PO TABS
5.0000 mg | ORAL_TABLET | Freq: Every day | ORAL | Status: DC
  Administered 2021-10-12: 5 mg via ORAL
  Filled 2021-10-12: qty 1

## 2021-10-12 MED ORDER — MAGNESIUM OXIDE -MG SUPPLEMENT 400 (240 MG) MG PO TABS
400.0000 mg | ORAL_TABLET | Freq: Two times a day (BID) | ORAL | Status: DC
  Administered 2021-10-12: 400 mg via ORAL
  Filled 2021-10-12: qty 1

## 2021-10-12 MED ORDER — FENTANYL CITRATE (PF) 100 MCG/2ML IJ SOLN
INTRAMUSCULAR | Status: DC | PRN
  Administered 2021-10-12 (×4): 50 ug via INTRAVENOUS

## 2021-10-12 MED ORDER — LIDOCAINE 2% (20 MG/ML) 5 ML SYRINGE
INTRAMUSCULAR | Status: DC | PRN
  Administered 2021-10-12: 60 mg via INTRAVENOUS

## 2021-10-12 MED ORDER — CEFAZOLIN SODIUM-DEXTROSE 2-4 GM/100ML-% IV SOLN
2.0000 g | INTRAVENOUS | Status: AC
  Administered 2021-10-12: 2 g via INTRAVENOUS

## 2021-10-12 MED ORDER — IBUPROFEN 400 MG PO TABS: 600.0000 mg | ORAL_TABLET | Freq: Three times a day (TID) | ORAL | Status: DC | PRN

## 2021-10-12 SURGICAL SUPPLY — 51 items
ADH SKN CLS APL DERMABOND .7 (GAUZE/BANDAGES/DRESSINGS) ×2
APL PRP STRL LF DISP 70% ISPRP (MISCELLANEOUS) ×2
APL PRP STRL LF ISPRP CHG 10.5 (MISCELLANEOUS) ×4
APPLICATOR CHLORAPREP 10.5 ORG (MISCELLANEOUS) ×4 IMPLANT
BAG DECANTER FOR FLEXI CONT (MISCELLANEOUS) ×3 IMPLANT
CHLORAPREP W/TINT 26 (MISCELLANEOUS) ×3 IMPLANT
CLOTH BEACON ORANGE TIMEOUT ST (SAFETY) ×3 IMPLANT
COVER LIGHT HANDLE STERIS (MISCELLANEOUS) ×6 IMPLANT
DERMABOND ADVANCED (GAUZE/BANDAGES/DRESSINGS) ×1
DERMABOND ADVANCED .7 DNX12 (GAUZE/BANDAGES/DRESSINGS) ×2 IMPLANT
DRAPE C-ARM FOLDED MOBILE STRL (DRAPES) ×3 IMPLANT
DRAPE CHEST BREAST 15X10 FENES (DRAPES) ×1 IMPLANT
DRAPE UTILITY W/TAPE 26X15 (DRAPES) ×1 IMPLANT
DRSG OPSITE POSTOP 4X8 (GAUZE/BANDAGES/DRESSINGS) ×1 IMPLANT
ELECT REM PT RETURN 9FT ADLT (ELECTROSURGICAL) ×3
ELECTRODE REM PT RTRN 9FT ADLT (ELECTROSURGICAL) ×2 IMPLANT
GAUZE SPONGE 4X4 12PLY STRL (GAUZE/BANDAGES/DRESSINGS) ×3 IMPLANT
GLOVE BIOGEL PI IND STRL 6.5 (GLOVE) IMPLANT
GLOVE BIOGEL PI IND STRL 7.0 (GLOVE) ×4 IMPLANT
GLOVE BIOGEL PI INDICATOR 6.5 (GLOVE) ×2
GLOVE BIOGEL PI INDICATOR 7.0 (GLOVE) ×2
GLOVE SURG SS PI 6.5 STRL IVOR (GLOVE) ×2 IMPLANT
GLOVE SURG SS PI 7.5 STRL IVOR (GLOVE) ×4 IMPLANT
GOWN STRL REUS W/TWL LRG LVL3 (GOWN DISPOSABLE) ×8 IMPLANT
INST SET MAJOR GENERAL (KITS) ×3 IMPLANT
IV NS 500ML (IV SOLUTION) ×3
IV NS 500ML BAXH (IV SOLUTION) ×2 IMPLANT
KIT PORT POWER 8FR ISP MRI (Port) ×3 IMPLANT
KIT TURNOVER KIT A (KITS) ×3 IMPLANT
MANIFOLD NEPTUNE II (INSTRUMENTS) ×3 IMPLANT
NDL HYPO 25X1 1.5 SAFETY (NEEDLE) ×2 IMPLANT
NEEDLE HYPO 25X1 1.5 SAFETY (NEEDLE) ×3 IMPLANT
NS IRRIG 1000ML POUR BTL (IV SOLUTION) ×3 IMPLANT
PACK MINOR (CUSTOM PROCEDURE TRAY) ×3 IMPLANT
PAD ARMBOARD 7.5X6 YLW CONV (MISCELLANEOUS) ×3 IMPLANT
PEG SAFETY KIT PULL 20FR (Peg) ×1 IMPLANT
SET BASIN LINEN APH (SET/KITS/TRAYS/PACK) ×3 IMPLANT
SPONGE T-LAP 18X18 ~~LOC~~+RFID (SPONGE) ×3 IMPLANT
STAPLER VISISTAT (STAPLE) ×1 IMPLANT
SUT CHROMIC 3 0 SH 27 (SUTURE) ×4 IMPLANT
SUT MNCRL AB 4-0 PS2 18 (SUTURE) ×3 IMPLANT
SUT PDS AB 0 CTX 60 (SUTURE) ×2 IMPLANT
SUT SILK 3 0 SH CR/8 (SUTURE) ×3 IMPLANT
SUT VIC AB 3-0 SH 27 (SUTURE) ×3
SUT VIC AB 3-0 SH 27X BRD (SUTURE) ×2 IMPLANT
SYR 20ML LL LF (SYRINGE) ×1 IMPLANT
SYR 5ML LL (SYRINGE) ×3 IMPLANT
SYR CONTROL 10ML LL (SYRINGE) ×3 IMPLANT
SYR TOOMEY IRRIG 70ML (MISCELLANEOUS) ×3
SYRINGE TOOMEY IRRIG 70ML (MISCELLANEOUS) IMPLANT
TOWEL OR 17X26 4PK STRL BLUE (TOWEL DISPOSABLE) ×3 IMPLANT

## 2021-10-12 NOTE — Anesthesia Preprocedure Evaluation (Signed)
Anesthesia Evaluation  Patient identified by MRN, date of birth, ID band Patient awake    Reviewed: Allergy & Precautions, NPO status , Patient's Chart, lab work & pertinent test results  History of Anesthesia Complications Negative for: history of anesthetic complications  Airway Mallampati: II  TM Distance: >3 FB Neck ROM: Full    Dental  (+) Poor Dentition, Missing, Loose, Dental Advisory Given,    Pulmonary Current Smoker and Patient abstained from smoking.,    Pulmonary exam normal breath sounds clear to auscultation       Cardiovascular hypertension, Pt. on medications negative cardio ROS Normal cardiovascular exam Rhythm:Regular Rate:Normal     Neuro/Psych negative neurological ROS  negative psych ROS   GI/Hepatic GERD  Medicated,(+)     substance abuse  alcohol use, Squamous cell esophageal cancer ([facility])   Endo/Other  diabetes, Well Controlled, Type 2, Oral Hypoglycemic Agents  Renal/GU negative Renal ROS  negative genitourinary   Musculoskeletal negative musculoskeletal ROS (+)   Abdominal   Peds negative pediatric ROS (+)  Hematology negative hematology ROS (+)   Anesthesia Other Findings Hypokalemia  Reproductive/Obstetrics negative OB ROS                             Anesthesia Physical  Anesthesia Plan  ASA: 3  Anesthesia Plan: General   Post-op Pain Management: Dilaudid IV   Induction: Intravenous  PONV Risk Score and Plan: 3 and Ondansetron  Airway Management Planned: Oral ETT  Additional Equipment:   Intra-op Plan:   Post-operative Plan: Extubation in OR  Informed Consent: I have reviewed the patients History and Physical, chart, labs and discussed the procedure including the risks, benefits and alternatives for the proposed anesthesia with the patient or authorized representative who has indicated his/her understanding and acceptance.     Dental  advisory given  Plan Discussed with: Surgeon and CRNA  Anesthesia Plan Comments:         Anesthesia Quick Evaluation

## 2021-10-12 NOTE — Anesthesia Procedure Notes (Signed)
Procedure Name: Intubation Date/Time: 10/12/2021 7:44 AM  Performed by: Myna Bright, CRNAPre-anesthesia Checklist: Patient identified, Emergency Drugs available, Suction available and Patient being monitored Patient Re-evaluated:Patient Re-evaluated prior to induction Oxygen Delivery Method: Circle system utilized Preoxygenation: Pre-oxygenation with 100% oxygen Induction Type: IV induction Ventilation: Mask ventilation without difficulty Laryngoscope Size: Mac and 4 Grade View: Grade I Tube type: Oral Tube size: 7.5 mm Number of attempts: 1 Airway Equipment and Method: Stylet Placement Confirmation: ETT inserted through vocal cords under direct vision, positive ETCO2 and breath sounds checked- equal and bilateral Secured at: 22 cm Tube secured with: Tape Dental Injury: Teeth and Oropharynx as per pre-operative assessment

## 2021-10-12 NOTE — Transfer of Care (Signed)
Immediate Anesthesia Transfer of Care Note  Patient: Trevor [name]  Procedure(s) Performed: INSERTION PORT-A-CATH (Left: Chest) OPEN GASTROSTOMY TUBE (Abdomen)  Patient Location: PACU  Anesthesia Type:General  Level of Consciousness: awake, alert , oriented and patient cooperative  Airway & Oxygen Therapy: Patient Spontanous Breathing  Post-op Assessment: Report given to RN, Post -op Vital signs reviewed and stable and Patient moving all extremities  Post vital signs: Reviewed and stable  Last Vitals:  Vitals Value Taken Time  BP 119/71 10/12/21 0924  Temp 37.2 C 10/12/21 0915  Pulse 63 10/12/21 0926  Resp 12 10/12/21 0926  SpO2 97 % 10/12/21 0926  Vitals shown include unvalidated device data.  Last Pain:  Vitals:   10/12/21 0915  TempSrc:   PainSc: Asleep      Patients Stated Pain Goal: 6 (83/41/96 2229)  Complications: No notable events documented.

## 2021-10-12 NOTE — Op Note (Signed)
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  He is about to undergo chemotherapy but also needs gastrostomy tube placement for nutrition.  The risks and benefits of the procedure including bleeding, infection, pneumothorax, and the possibility of cardiopulmonary difficulties were fully explained to the patient, who gave informed consent.  Procedure note: The patient was placed in the supine position.  After induction of general endotracheal anesthesia, the chest and abdomen were prepped and draped using the usual sterile technique with ChloraPrep.  Surgical site confirmation was performed.  I first performed the Port-A-Cath insertion.  An incision was made below the left clavicle.  A subcutaneous pocket was formed.  A needle was advanced into the left subclavian vein using the Seldinger technique without difficulty.  A guidewire was then advanced into the right atrium under fluoroscopic guidance.  An introducer and peel-away sheath were placed over the guidewire.  The catheter was inserted through the peel-away sheath and the peel-away sheath was removed.  The catheter was then attached to the port and the port placed in subcutaneous pocket.  Adequate positioning was confirmed by fluoroscopy.  Good backflow of venous blood was noted on aspiration of the port.  The port was flushed with heparin flush.  Subcutaneous layer was reapproximated using a 3-0 Vicryl interrupted suture.  The skin was closed using a 4-0 Monocryl subcuticular suture.  Dermabond was applied.  Next, I proceeded with the open gastrostomy tube placement.   An upper midline incision was made down to the fascia.  The peritoneal cavity was entered into without difficulty.  A site along the greater curvature of the antrum was found.  An incision was made in the left upper quadrant and the gastrostomy tube was positioned in this region.  A 20 French mushroom gastrostomy bulb was then placed into the stomach under direct vision.  Several 3-0 silk sutures were placed circumferentially to narrow the entrance.  A 3-0 Chromic Gut suture was then used to attach the stomach to the anterior abdominal wall.  The bolster was placed at the 3-1/2 cm Ellee Wawrzyniak.  I did flush the gastrostomy tube and no leakage was noted.  Gastric contents were aspirated.  Triple antibiotic ointment and a dressing were then applied to the gastrostomy tube site.  The midline fascia was reapproximated using a looped 0 PDS running suture.  Exparel was instilled in the surrounding skin.  The incision was closed using staples.  Betadine ointment and dry sterile dressings were applied.  All tape and needle counts were correct at the end of the procedure.  The patient was extubated in the operating room and transferred to PACU in stable condition.  Complications: None  EBL: Minimal  Specimen: None

## 2021-10-12 NOTE — Anesthesia Postprocedure Evaluation (Signed)
Anesthesia Post Note  Patient: Liberato Stansbery  Procedure(s) Performed: INSERTION PORT-A-CATH (Left: Chest) OPEN GASTROSTOMY TUBE (Abdomen)  Patient location during evaluation: PACU Anesthesia Type: General Level of consciousness: awake and alert and oriented Pain management: pain level controlled Vital Signs Assessment: post-procedure vital signs reviewed and stable Respiratory status: spontaneous breathing, nonlabored ventilation and respiratory function stable Cardiovascular status: blood pressure returned to baseline and stable Postop Assessment: no apparent nausea or vomiting Anesthetic complications: no   No notable events documented.   Last Vitals:  Vitals:   10/12/21 0915 10/12/21 0934  BP: 122/61 (!) 156/94  Pulse: 64 86  Resp: 12 14  Temp: 37.2 C 36.9 C  SpO2: 97% 98%    Last Pain:  Vitals:   10/12/21 0915  TempSrc:   PainSc: Asleep                 Faven Watterson C Cohl Behrens

## 2021-10-12 NOTE — Interval H&P Note (Signed)
History and Physical Interval Note:  10/12/2021 7:05 AM  Trevor [name]  has presented today for surgery, with the diagnosis of squamous cell esophageal cancer.  The various methods of treatment have been discussed with the patient and family. After consideration of risks, benefits and other options for treatment, the patient has consented to  Procedure(s): INSERTION PORT-A-CATH (Left) OPEN GASTROSTOMY TUBE (N/A) as a surgical intervention.  The patient's history has been reviewed, patient examined, no change in status, stable for surgery.  I have reviewed the patient's chart and labs.  Questions were answered to the patient's satisfaction.     Aviva Signs

## 2021-10-12 NOTE — Discharge Summary (Signed)
Physician Discharge Summary  Patient ID: Trevor [name] MRN: [id number] DOB/AGE: 06-15-1970 51 y.o.  Admit date: 10/10/2021 Discharge date: 10/12/2021  Admission Diagnoses: Obstructing esophageal carcinoma, hypokalemia, severe protein calorie malnutrition  Discharge Diagnoses: Same Principal Problem:   Esophageal carcinoma ([place]) Active Problems:   Protein-calorie malnutrition, severe   Discharged Condition: good  Hospital Course: Patient is a 51 year old black male with near obstructing esophageal carcinoma who originally was supposed to undergo a Port-A-Cath placement and gastrostomy tube placement on 10/10/2021.  He was found to be severely hypokalemic at 2.6.  He was thus admitted to the hospital for IV potassium and magnesium supplementation.  He subsequently underwent a Port-A-Cath placement with open gastrostomy tube placement on 10/12/2021.  He tolerated both procedures well.  He is doing well postoperatively.  He is being discharged home on 10/12/2021 in good condition.  Treatments: surgery: Port-A-Cath placement, open gastrostomy tube placement on 10/12/2021  Discharge Exam: Blood pressure (!) 156/94, pulse 86, temperature 98.4 F (36.9 C), resp. rate 14, height '5\' 6"'$  (1.676 m), weight 53.6 kg, SpO2 98 %. General appearance: alert, cooperative, and no distress Resp: clear to auscultation bilaterally Cardio: regular rate and rhythm, S1, S2 normal, no murmur, click, rub or gallop GI: Soft, incision healing well.  Gastrostomy tube in place left upper quadrant.  Disposition: Discharge disposition: 01-Home or Self Care       Discharge Instructions     Diet - low sodium heart healthy   Complete by: As directed    Increase activity slowly   Complete by: As directed       Allergies as of 10/12/2021   No Known Allergies      Medication List     TAKE these medications    allopurinol 300 MG tablet Commonly known as: ZYLOPRIM Take 1 tablet (300 mg total) by mouth daily.    amLODipine 5 MG tablet Commonly known as: NORVASC Take 1 tablet (5 mg total) by mouth daily.   fluorouracil CALGB 32355 2,400 mg/m2 in sodium chloride 0.9 % 150 mL Inject 2,400 mg/m2 into the vein over 48 hr. Every 2 weeks   FLUOROURACIL IV Inject into the vein every 14 (fourteen) days.   ibuprofen 600 MG tablet Commonly known as: ADVIL Take 1 tablet (600 mg total) by mouth every 8 (eight) hours as needed.   LEUCOVORIN CALCIUM IV Inject into the vein every 14 (fourteen) days.   loperamide 2 MG capsule Commonly known as: IMODIUM Take 1 capsule (2 mg total) by mouth as needed for diarrhea or loose stools (take as directed according to the instruction sheet you were given).   magnesium oxide 400 (240 Mg) MG tablet Commonly known as: MAG-OX Take 1 tablet (400 mg total) by mouth 2 (two) times daily.   OXALIPLATIN IV Inject into the vein every 14 (fourteen) days.   potassium chloride SA 20 MEQ tablet Commonly known as: KLOR-CON M Take 2 tablets (40 mEq total) by mouth daily.   prochlorperazine 10 MG tablet Commonly known as: COMPAZINE Take 1 tablet (10 mg total) by mouth every 6 (six) hours as needed for nausea or vomiting.   Vitamin D (Ergocalciferol) 1.25 MG (50000 UNIT) Caps capsule Commonly known as: DRISDOL Take 1 capsule (50,000 Units total) by mouth every 7 (seven) days. What changed: when to take this        Follow-up Information     Aviva Signs, MD. Schedule an appointment as soon as possible for a visit on 10/26/2021.   Specialty: General Surgery  Contact information: [address] [place] [address] [phone]                 Signed: Aviva Signs 10/12/2021, 10:19 AM

## 2021-10-13 ENCOUNTER — Telehealth: Payer: Self-pay

## 2021-10-13 ENCOUNTER — Other Ambulatory Visit: Payer: Self-pay

## 2021-10-13 ENCOUNTER — Encounter: Payer: Self-pay | Admitting: *Deleted

## 2021-10-13 ENCOUNTER — Ambulatory Visit (HOSPITAL_COMMUNITY)
Admission: RE | Admit: 2021-10-13 | Discharge: 2021-10-13 | Disposition: A | Discharge status: Home / Self Care | Payer: 59 | Source: Ambulatory Visit | Attending: Hematology | Admitting: Hematology

## 2021-10-13 DIAGNOSIS — C159 Malignant neoplasm of esophagus, unspecified: Secondary | ICD-10-CM | POA: Diagnosis not present

## 2021-10-13 DIAGNOSIS — M899 Disorder of bone, unspecified: Secondary | ICD-10-CM | POA: Diagnosis not present

## 2021-10-13 DIAGNOSIS — C7989 Secondary malignant neoplasm of other specified sites: Secondary | ICD-10-CM | POA: Diagnosis not present

## 2021-10-13 DIAGNOSIS — Z8501 Personal history of malignant neoplasm of esophagus: Secondary | ICD-10-CM | POA: Diagnosis not present

## 2021-10-13 MED ORDER — SODIUM CHLORIDE 0.9 % IV SOLN: INTRAVENOUS | Status: DC

## 2021-10-13 MED ORDER — LIDOCAINE HCL (PF) 1 % IJ SOLN
INTRAMUSCULAR | Status: AC
  Filled 2021-10-13: qty 30

## 2021-10-13 NOTE — Progress Notes (Signed)
Spoke with [name] in [place] - he is aware that patient does not want to be put to sleep/ sedated and we were unable to get IV access.  Per [name] for soft tissue BX he will not need IV.

## 2021-10-13 NOTE — Progress Notes (Signed)
Pt discharged in [place] with brother. Dressing to left shoulder dry and intact.Pt awake and alert. In no distress. D/C instructions given

## 2021-10-13 NOTE — Procedures (Signed)
Interventional Radiology Procedure:   Indications: Esophageal cancer with suspicious muscular lesions on PET CT  Procedure: US guided aspiration and biopsy of left shoulder lesion  Findings: Lesion in left shoulder musculature with central necrosis or cystic component.  Aspirated 2 ml of brownish cloudy fluid and then did core biopsies on the solid component.   Complications: No immediate complications noted.     EBL: Minimal  Plan: Discharge patient to home.  Sending the fluid for cytology and culture.     Russell Engelstad R. Anselm Pancoast, MD  Pager: [phone]

## 2021-10-13 NOTE — Telephone Encounter (Signed)
Transition Care Management Unsuccessful Follow-up Telephone Call  Date of discharge and from where:  [place] 10/12/21   Attempts:  1st Attempt  Reason for unsuccessful TCM follow-up call:  Unable to leave message

## 2021-10-14 ENCOUNTER — Telehealth: Payer: Self-pay | Admitting: *Deleted

## 2021-10-14 LAB — CYTOLOGY - NON PAP

## 2021-10-14 LAB — SURGICAL PATHOLOGY

## 2021-10-14 NOTE — Progress Notes (Signed)
Pharmacist Chemotherapy Monitoring - Initial Assessment    Anticipated start date: 10/19/21   The following has been reviewed per standard work regarding the patient's treatment regimen: The patient's diagnosis, treatment plan and drug doses, and organ/hematologic function Lab orders and baseline tests specific to treatment regimen  The treatment plan start date, drug sequencing, and pre-medications Prior authorization status  Patient's documented medication list, including drug-drug interaction screen and prescriptions for anti-emetics and supportive care specific to the treatment regimen The drug concentrations, fluid compatibility, administration routes, and timing of the medications to be used The patient's access for treatment and lifetime cumulative dose history, if applicable  The patient's medication allergies and previous infusion related reactions, if applicable   Changes made to treatment plan:  treatment plan date  Follow up needed:  Pending authorization for treatment    Daylene Katayama, [facility], 10/14/2021  2:24 PM

## 2021-10-17 ENCOUNTER — Telehealth: Payer: Self-pay | Admitting: *Deleted

## 2021-10-17 ENCOUNTER — Encounter (HOSPITAL_COMMUNITY): Payer: Self-pay | Admitting: General Surgery

## 2021-10-18 ENCOUNTER — Encounter (HOSPITAL_COMMUNITY): Payer: Self-pay

## 2021-10-18 LAB — AEROBIC/ANAEROBIC CULTURE W GRAM STAIN (SURGICAL/DEEP WOUND): Culture: NO GROWTH

## 2021-10-18 MED FILL — Dexamethasone Sodium Phosphate Inj 100 MG/10ML: INTRAMUSCULAR | Qty: 1 | Status: AC

## 2021-10-19 ENCOUNTER — Encounter (HOSPITAL_COMMUNITY): Payer: Self-pay | Admitting: *Deleted

## 2021-10-19 ENCOUNTER — Inpatient Hospital Stay (HOSPITAL_COMMUNITY): Payer: 59

## 2021-10-19 ENCOUNTER — Ambulatory Visit (HOSPITAL_COMMUNITY): Payer: 59

## 2021-10-19 ENCOUNTER — Ambulatory Visit (HOSPITAL_COMMUNITY): Payer: 59 | Admitting: Hematology

## 2021-10-20 ENCOUNTER — Encounter (HOSPITAL_COMMUNITY): Payer: 59 | Admitting: Dietician

## 2021-10-20 ENCOUNTER — Encounter (HOSPITAL_COMMUNITY): Payer: Self-pay

## 2021-10-20 ENCOUNTER — Inpatient Hospital Stay (HOSPITAL_COMMUNITY): Payer: 59 | Admitting: Dietician

## 2021-10-20 NOTE — Progress Notes (Signed)
Nutrition Follow-up:  Patient with stage IV esophageal cancer. Plans to start chemotherapy with FOLFOX + Nivolumab q14d (currently pending insurance authorization). S/p open G-tube on 6/21.  Met with patient in clinic. He reports continuing to drink 6 Ensure as well as soups and ice cream. Patient is tolerating oral supplements well now that he is no longer drinking all 6 at the same time. Patient reports abdominal soreness around stitches. He denies pain related to G-tube. Patient has been flushing tube with water daily. He is agreeable to completing bolus feeding today. Patient asking if he can continue to eat orally.    Medications: reviewed  Labs: reviewed  Anthropometrics: Last weight 118 lb 3.2 oz on 6/19 decreased 7% (9 lbs) in 2 weeks; severe. Of note - pt hospitalized 6/19-6/21 for surgical placed G-tube  Estimated Energy Needs  Kcals: 2020-2308 Protein: 98-110 Fluid: 2.3 L  Nutren 1.5 - 6 cartons split over 3 feedings daily. Flush tube with 60 ml water before and after each feeding. This provides 2250 kcal, 102 grams protein, 1146 ml free water from formula (1506 ml total water with flushes). Drink by mouth or give via tube additional 3 cups fluids/day  NUTRITION DIAGNOSIS: Severe malnutrition continues    INTERVENTION:  Patient able to successfully demonstrate bolus feeding complete with water flush before and after. He tolerated one carton Nutren 1.5 Continue oral intake as tolerated 3 complimentary cases of Nutren 1.5 plus supplies provided today     MONITORING, EVALUATION, GOAL: weight trends, tube feeding, oral intake   NEXT VISIT: Thursday July 6

## 2021-10-21 ENCOUNTER — Encounter (HOSPITAL_COMMUNITY): Payer: 59

## 2021-10-21 NOTE — Progress Notes (Signed)
Pharmacist Chemotherapy Monitoring - Initial Assessment    Anticipated start date: 10/26/21   The following has been reviewed per standard work regarding the patient's treatment regimen: The patient's diagnosis, treatment plan and drug doses, and organ/hematologic function Lab orders and baseline tests specific to treatment regimen  The treatment plan start date, drug sequencing, and pre-medications Prior authorization status  Patient's documented medication list, including drug-drug interaction screen and prescriptions for anti-emetics and supportive care specific to the treatment regimen The drug concentrations, fluid compatibility, administration routes, and timing of the medications to be used The patient's access for treatment and lifetime cumulative dose history, if applicable  The patient's medication allergies and previous infusion related reactions, if applicable   Changes made to treatment plan:  treatment plan date  Follow up needed:  N/A Delayed due to PA   Wynona Neat, [facility], 10/21/2021  11:10 AM

## 2021-10-22 ENCOUNTER — Encounter (HOSPITAL_COMMUNITY): Payer: Self-pay | Admitting: *Deleted

## 2021-10-22 ENCOUNTER — Other Ambulatory Visit: Payer: Self-pay

## 2021-10-22 ENCOUNTER — Emergency Department (HOSPITAL_COMMUNITY)
Admission: EM | Admit: 2021-10-22 | Discharge: 2021-10-22 | Disposition: A | Discharge status: Home / Self Care | Payer: 59 | Attending: Emergency Medicine | Admitting: Emergency Medicine

## 2021-10-22 DIAGNOSIS — K9421 Gastrostomy hemorrhage: Secondary | ICD-10-CM | POA: Diagnosis not present

## 2021-10-22 DIAGNOSIS — R6889 Other general symptoms and signs: Secondary | ICD-10-CM

## 2021-10-22 DIAGNOSIS — K9423 Gastrostomy malfunction: Secondary | ICD-10-CM | POA: Diagnosis not present

## 2021-10-22 HISTORY — DX: Malignant neoplasm of esophagus, unspecified: C15.9

## 2021-10-22 NOTE — ED Triage Notes (Signed)
Pt c/o drainage from his G-Tube. Pt reports everytime he takes food or drink by mouth he has fluid that leaks from around his tube. Today, pt was at work in [employer] and noticed his tube was leaking a lot more than normal. 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

## 2021-10-22 NOTE — Discharge Instructions (Addendum)
You were seen in the emergency department for evaluation of leakage of your G-tube.  The area did not show any signs of infection.  Unfortunately the tube site is so fresh that I do not want to manipulate the tube much at this time.  Follow-up with Dr. Arnoldo Morale as scheduled.  Return if any worsening or concerning symptoms.

## 2021-10-22 NOTE — ED Provider Notes (Signed)
[facility] EMERGENCY DEPARTMENT Provider Note   CSN: [id number] Arrival date & time: 10/22/21  1516     History {Add pertinent medical, surgical, social history, OB history to HPI:1} Chief Complaint  Patient presents with   G-Tube Problem    Trevor [name] is a 51 y.o. male.  Has a history of esophageal cancer.  He had a G-tube placed about a week ago.  He said for the last few days its been leaking from the abdominal wall where the tube goes through the skin.  No pain.  No other complaints.  He uses the tube for feedings due to his esophageal cancer.  He works in [employer] and does not like the idea of secretions coming out.  The history is provided by the patient.       Home Medications Prior to Admission medications   Medication Sig Start Date End Date Taking? Authorizing Provider  allopurinol (ZYLOPRIM) 300 MG tablet Take 1 tablet (300 mg total) by mouth daily. 08/04/21   Sanjuana Kava, MD  amLODipine (NORVASC) 5 MG tablet Take 1 tablet (5 mg total) by mouth daily. 06/01/21   Orson Eva, MD  fluorouracil CALGB 78295 2,400 mg/m2 in sodium chloride 0.9 % 150 mL Inject 2,400 mg/m2 into the vein over 48 hr. Every 2 weeks    [provider]  FLUOROURACIL IV Inject into the vein every 14 (fourteen) days.    [provider]  ibuprofen (ADVIL) 600 MG tablet Take 1 tablet (600 mg total) by mouth every 8 (eight) hours as needed. 08/18/21   [name], [name]  LEUCOVORIN CALCIUM IV Inject into the vein every 14 (fourteen) days.    [provider]  loperamide (IMODIUM) 2 MG capsule Take 1 capsule (2 mg total) by mouth as needed for diarrhea or loose stools (take as directed according to the instruction sheet you were given). Patient not taking: Reported on 10/04/2021 10/04/21   Derek Jack, MD  magnesium oxide (MAG-OX) 400 (240 Mg) MG tablet Take 1 tablet (400 mg total) by mouth 2 (two) times daily. 06/01/21   Orson Eva, MD  OXALIPLATIN IV Inject into the vein  every 14 (fourteen) days.    [provider]  potassium chloride SA (KLOR-CON M) 20 MEQ tablet Take 2 tablets (40 mEq total) by mouth daily. 10/05/21   Derek Jack, MD  prochlorperazine (COMPAZINE) 10 MG tablet Take 1 tablet (10 mg total) by mouth every 6 (six) hours as needed for nausea or vomiting. Patient not taking: Reported on 10/04/2021 10/04/21   Derek Jack, MD  Vitamin D, Ergocalciferol, (DRISDOL) 1.25 MG (50000 UNIT) CAPS capsule Take 1 capsule (50,000 Units total) by mouth every 7 (seven) days. Patient taking differently: Take 50,000 Units by mouth every Monday. 06/07/21   Orson Eva, MD      Allergies    Patient has no known allergies.    Review of Systems   Review of Systems  Constitutional:  Negative for fever.  Respiratory:  Negative for shortness of breath.   Cardiovascular:  Negative for chest pain.  Gastrointestinal:  Negative for abdominal pain and vomiting.    Physical Exam Updated Vital Signs BP 139/84   Pulse 73   Temp 97.6 F (36.4 C) (Oral)   Resp 16   Ht '5\' 6"'$  (1.676 m)   Wt 53.6 kg   SpO2 100%   BMI 19.08 kg/m  Physical Exam Vitals and nursing note reviewed.  Constitutional:      General: He is  not in acute distress.    Appearance: Normal appearance. He is well-developed.  HENT:     Head: Normocephalic and atraumatic.  Eyes:     Conjunctiva/sclera: Conjunctivae normal.  Cardiovascular:     Rate and Rhythm: Normal rate and regular rhythm.     Heart sounds: No murmur heard. Pulmonary:     Effort: Pulmonary effort is normal. No respiratory distress.     Breath sounds: Normal breath sounds.  Abdominal:     Palpations: Abdomen is soft.     Tenderness: There is no abdominal tenderness. There is no guarding or rebound.     Comments: He has staples in place from healing surgical incision.  No surrounding erythema or induration.  Just lateral to this is a G-tube which is in place.  There is no significant drainage and no  erythema.  Musculoskeletal:        General: No swelling.     Cervical back: Neck supple.  Skin:    General: Skin is warm and dry.     Capillary Refill: Capillary refill takes less than 2 seconds.  Neurological:     Mental Status: He is alert.     ED Results / Procedures / Treatments   Labs (all labs ordered are listed, but only abnormal results are displayed) Labs Reviewed - No data to display  EKG None  Radiology No results found.  Procedures Procedures  {Document cardiac monitor, telemetry assessment procedure when appropriate:1}  Medications Ordered in ED Medications - No data to display  ED Course/ Medical Decision Making/ A&P                           Medical Decision Making  ***  {Document critical care time when appropriate:1} {Document review of labs and clinical decision tools ie heart score, Chads2Vasc2 etc:1}  {Document your independent review of radiology images, and any outside records:1} {Document your discussion with family members, caretakers, and with consultants:1} {Document social determinants of health affecting pt's care:1} {Document your decision making why or why not admission, treatments were needed:1} Final Clinical Impression(s) / ED Diagnoses Final diagnoses:  None    Rx / DC Orders ED Discharge Orders     None

## 2021-10-26 ENCOUNTER — Inpatient Hospital Stay (HOSPITAL_BASED_OUTPATIENT_CLINIC_OR_DEPARTMENT_OTHER): Payer: 59 | Admitting: Hematology

## 2021-10-26 ENCOUNTER — Inpatient Hospital Stay (HOSPITAL_COMMUNITY): Payer: 59

## 2021-10-26 ENCOUNTER — Encounter (HOSPITAL_COMMUNITY): Payer: Self-pay

## 2021-10-26 ENCOUNTER — Inpatient Hospital Stay (HOSPITAL_COMMUNITY): Payer: 59 | Attending: Hematology

## 2021-10-26 ENCOUNTER — Encounter: Payer: Self-pay | Admitting: General Surgery

## 2021-10-26 ENCOUNTER — Ambulatory Visit (INDEPENDENT_AMBULATORY_CARE_PROVIDER_SITE_OTHER): Payer: 59 | Admitting: General Surgery

## 2021-10-26 VITALS — BP 163/86 | HR 64 | Temp 97.5°F | Resp 18

## 2021-10-26 VITALS — BP 166/105 | HR 107 | Temp 97.9°F | Resp 22 | Ht 66.0 in | Wt 124.0 lb

## 2021-10-26 DIAGNOSIS — D500 Iron deficiency anemia secondary to blood loss (chronic): Secondary | ICD-10-CM | POA: Diagnosis not present

## 2021-10-26 DIAGNOSIS — Z79899 Other long term (current) drug therapy: Secondary | ICD-10-CM | POA: Insufficient documentation

## 2021-10-26 DIAGNOSIS — C159 Malignant neoplasm of esophagus, unspecified: Secondary | ICD-10-CM

## 2021-10-26 DIAGNOSIS — R911 Solitary pulmonary nodule: Secondary | ICD-10-CM | POA: Insufficient documentation

## 2021-10-26 DIAGNOSIS — Z5112 Encounter for antineoplastic immunotherapy: Secondary | ICD-10-CM | POA: Diagnosis not present

## 2021-10-26 DIAGNOSIS — Z8042 Family history of malignant neoplasm of prostate: Secondary | ICD-10-CM | POA: Diagnosis not present

## 2021-10-26 DIAGNOSIS — Z800 Family history of malignant neoplasm of digestive organs: Secondary | ICD-10-CM | POA: Insufficient documentation

## 2021-10-26 DIAGNOSIS — I10 Essential (primary) hypertension: Secondary | ICD-10-CM | POA: Insufficient documentation

## 2021-10-26 DIAGNOSIS — C49A1 Gastrointestinal stromal tumor of esophagus: Secondary | ICD-10-CM | POA: Diagnosis not present

## 2021-10-26 DIAGNOSIS — C155 Malignant neoplasm of lower third of esophagus: Secondary | ICD-10-CM | POA: Diagnosis not present

## 2021-10-26 DIAGNOSIS — F17210 Nicotine dependence, cigarettes, uncomplicated: Secondary | ICD-10-CM | POA: Insufficient documentation

## 2021-10-26 DIAGNOSIS — R634 Abnormal weight loss: Secondary | ICD-10-CM | POA: Insufficient documentation

## 2021-10-26 DIAGNOSIS — D509 Iron deficiency anemia, unspecified: Secondary | ICD-10-CM | POA: Diagnosis not present

## 2021-10-26 DIAGNOSIS — E876 Hypokalemia: Secondary | ICD-10-CM | POA: Diagnosis not present

## 2021-10-26 DIAGNOSIS — Z5111 Encounter for antineoplastic chemotherapy: Secondary | ICD-10-CM | POA: Insufficient documentation

## 2021-10-26 DIAGNOSIS — Z09 Encounter for follow-up examination after completed treatment for conditions other than malignant neoplasm: Secondary | ICD-10-CM

## 2021-10-26 DIAGNOSIS — E119 Type 2 diabetes mellitus without complications: Secondary | ICD-10-CM | POA: Diagnosis not present

## 2021-10-26 DIAGNOSIS — R69 Illness, unspecified: Secondary | ICD-10-CM | POA: Diagnosis not present

## 2021-10-26 LAB — CBC WITH DIFFERENTIAL/PLATELET
Abs Immature Granulocytes: 0.04 10*3/uL (ref 0.00–0.07)
Basophils Absolute: 0 10*3/uL (ref 0.0–0.1)
Basophils Relative: 0 %
Eosinophils Absolute: 0.2 10*3/uL (ref 0.0–0.5)
Eosinophils Relative: 2 %
HCT: 27.3 % — ABNORMAL LOW (ref 39.0–52.0)
Hemoglobin: 9 g/dL — ABNORMAL LOW (ref 13.0–17.0)
Immature Granulocytes: 0 %
Lymphocytes Relative: 19 %
Lymphs Abs: 1.9 10*3/uL (ref 0.7–4.0)
MCH: 24 pg — ABNORMAL LOW (ref 26.0–34.0)
MCHC: 33 g/dL (ref 30.0–36.0)
MCV: 72.8 fL — ABNORMAL LOW (ref 80.0–100.0)
Monocytes Absolute: 0.7 10*3/uL (ref 0.1–1.0)
Monocytes Relative: 7 %
Neutro Abs: 7.4 10*3/uL (ref 1.7–7.7)
Neutrophils Relative %: 72 %
Platelets: 386 10*3/uL (ref 150–400)
RBC: 3.75 MIL/uL — ABNORMAL LOW (ref 4.22–5.81)
RDW: 16.7 % — ABNORMAL HIGH (ref 11.5–15.5)
WBC: 10.2 10*3/uL (ref 4.0–10.5)
nRBC: 0 % (ref 0.0–0.2)

## 2021-10-26 LAB — COMPREHENSIVE METABOLIC PANEL
ALT: 7 U/L (ref 0–44)
AST: 10 U/L — ABNORMAL LOW (ref 15–41)
Albumin: 2.6 g/dL — ABNORMAL LOW (ref 3.5–5.0)
Alkaline Phosphatase: 52 U/L (ref 38–126)
Anion gap: 7 (ref 5–15)
BUN: 11 mg/dL (ref 6–20)
CO2: 27 mmol/L (ref 22–32)
Calcium: 9.2 mg/dL (ref 8.9–10.3)
Chloride: 104 mmol/L (ref 98–111)
Creatinine, Ser: 0.53 mg/dL — ABNORMAL LOW (ref 0.61–1.24)
GFR, Estimated: >60 mL/min (ref >60)
Glucose, Bld: 88 mg/dL (ref 70–99)
Potassium: 2.6 mmol/L — CL (ref 3.5–5.1)
Sodium: 138 mmol/L (ref 135–145)
Total Bilirubin: 0.6 mg/dL (ref 0.3–1.2)
Total Protein: 6.5 g/dL (ref 6.5–8.1)

## 2021-10-26 LAB — TSH: TSH: 0.565 u[IU]/mL (ref 0.350–4.500)

## 2021-10-26 LAB — IRON AND TIBC
Iron: 32 ug/dL — ABNORMAL LOW (ref 45–182)
Saturation Ratios: 13 % — ABNORMAL LOW (ref 17.9–39.5)
TIBC: 242 ug/dL — ABNORMAL LOW (ref 250–450)
UIBC: 210 ug/dL

## 2021-10-26 LAB — MAGNESIUM: Magnesium: 1.6 mg/dL — ABNORMAL LOW (ref 1.7–2.4)

## 2021-10-26 LAB — FERRITIN: Ferritin: 55 ng/mL (ref 24–336)

## 2021-10-26 MED ORDER — POTASSIUM CHLORIDE CRYS ER 20 MEQ PO TBCR
40.0000 meq | EXTENDED_RELEASE_TABLET | ORAL | Status: AC
  Administered 2021-10-26 (×2): 40 meq via ORAL
  Filled 2021-10-26 (×2): qty 2

## 2021-10-26 MED ORDER — SODIUM CHLORIDE 0.9 % IV SOLN
10.0000 mg | Freq: Once | INTRAVENOUS | Status: AC
  Administered 2021-10-26: 10 mg via INTRAVENOUS
  Filled 2021-10-26: qty 1

## 2021-10-26 MED ORDER — LEUCOVORIN CALCIUM INJECTION 350 MG
400.0000 mg/m2 | Freq: Once | INTRAVENOUS | Status: AC
  Administered 2021-10-26: 632 mg via INTRAVENOUS
  Filled 2021-10-26: qty 31.6

## 2021-10-26 MED ORDER — HEPARIN SOD (PORK) LOCK FLUSH 100 UNIT/ML IV SOLN: 500.0000 [IU] | Freq: Once | INTRAVENOUS | Status: DC | PRN

## 2021-10-26 MED ORDER — OXALIPLATIN CHEMO INJECTION 100 MG/20ML
85.0000 mg/m2 | Freq: Once | INTRAVENOUS | Status: AC
  Administered 2021-10-26: 135 mg via INTRAVENOUS
  Filled 2021-10-26: qty 10

## 2021-10-26 MED ORDER — FLUOROURACIL CHEMO INJECTION 2.5 GM/50ML
400.0000 mg/m2 | Freq: Once | INTRAVENOUS | Status: AC
  Administered 2021-10-26: 650 mg via INTRAVENOUS
  Filled 2021-10-26: qty 13

## 2021-10-26 MED ORDER — SODIUM CHLORIDE 0.9 % IV SOLN
240.0000 mg | Freq: Once | INTRAVENOUS | Status: AC
  Administered 2021-10-26: 240 mg via INTRAVENOUS
  Filled 2021-10-26: qty 24

## 2021-10-26 MED ORDER — AMLODIPINE BESYLATE 5 MG PO TABS
5.0000 mg | ORAL_TABLET | Freq: Once | ORAL | Status: AC
  Administered 2021-10-26: 5 mg via ORAL
  Filled 2021-10-26: qty 1

## 2021-10-26 MED ORDER — POTASSIUM CHLORIDE CRYS ER 20 MEQ PO TBCR
40.0000 meq | EXTENDED_RELEASE_TABLET | Freq: Every day | ORAL | 3 refills | Status: DC
Start: 2021-10-26 — End: 2021-10-27

## 2021-10-26 MED ORDER — POTASSIUM CHLORIDE 10 MEQ/100ML IV SOLN
10.0000 meq | INTRAVENOUS | Status: AC
  Administered 2021-10-26 (×2): 10 meq via INTRAVENOUS
  Filled 2021-10-26 (×2): qty 100

## 2021-10-26 MED ORDER — SODIUM CHLORIDE 0.9% FLUSH: 10.0000 mL | INTRAVENOUS | Status: DC | PRN

## 2021-10-26 MED ORDER — DEXTROSE 5 % IV SOLN: Freq: Once | INTRAVENOUS | Status: AC

## 2021-10-26 MED ORDER — PROCHLORPERAZINE MALEATE 10 MG PO TABS
10.0000 mg | ORAL_TABLET | Freq: Four times a day (QID) | ORAL | 6 refills | Status: DC | PRN
Start: 2021-10-26 — End: 2021-10-27

## 2021-10-26 MED ORDER — SODIUM CHLORIDE 0.9 % IV SOLN
2400.0000 mg/m2 | INTRAVENOUS | Status: DC
  Administered 2021-10-26: 3800 mg via INTRAVENOUS
  Filled 2021-10-26: qty 76

## 2021-10-26 MED ORDER — MAGNESIUM SULFATE 2 GM/50ML IV SOLN
2.0000 g | Freq: Once | INTRAVENOUS | Status: AC
  Administered 2021-10-26: 2 g via INTRAVENOUS
  Filled 2021-10-26: qty 50

## 2021-10-26 MED ORDER — PALONOSETRON HCL INJECTION 0.25 MG/5ML
0.2500 mg | Freq: Once | INTRAVENOUS | Status: AC
  Administered 2021-10-26: 0.25 mg via INTRAVENOUS
  Filled 2021-10-26: qty 5

## 2021-10-26 NOTE — Progress Notes (Signed)
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  PRIOR THERAPY: none  NGS Results: not done  CURRENT THERAPY: FOLFOX + Nivolumab q14d  BRIEF ONCOLOGIC HISTORY:  Oncology History  Squamous cell esophageal cancer ([place])  09/13/2021 Initial Diagnosis   Squamous cell esophageal cancer ([place])   10/26/2021 -  Chemotherapy   Patient is on Treatment Plan : GASTROESOPHAGEAL FOLFOX + Nivolumab q14d       CANCER STAGING:  Cancer Staging  Squamous cell esophageal cancer ([facility]) Staging form: Esophagus - Squamous Cell Carcinoma, AJCC 8th Edition - Clinical stage from 09/13/2021: Stage IVB (cT3, cN1, cM1, G3, L: Lower) - Unsigned - Pathologic: No stage assigned - Unsigned   INTERVAL HISTORY:  Mr. Trevor [name], a 51 y.o. male, returns for routine follow-up and consideration for next cycle of chemotherapy. Trevor [name] was last seen on 09/26/2021.  Due for cycle #1 of FOLFOX + Nivolumab today.   Overall, he tells me he has been feeling pretty well. He denies current pain. He reports diarrhea occurring about 6 times daily following tube feeds; he is using 4 cans of Nutren daily. He reports he is drinking mostly fruit and vegetable smoothies and broth by mouth.   Overall, he feels ready for next cycle of chemo today.    REVIEW OF SYSTEMS:  Review of Systems  Constitutional:  Negative for appetite change and fatigue.  Gastrointestinal:  Positive for nausea.  All other systems reviewed and are negative.   PAST MEDICAL/SURGICAL HISTORY:  Past Medical History:  Diagnosis Date   Diabetes mellitus without complication ([facility])    Esophageal cancer ([facility] [facility])    Hypertension    Past Surgical History:  Procedure Laterality Date   BIOPSY  08/26/2021   Procedure: BIOPSY;   Surgeon: Trevor Harman, DO;  Location: [facility];  Service: Endoscopy;;   ESOPHAGOGASTRODUODENOSCOPY (EGD) WITH PROPOFOL N/A 08/26/2021   Procedure: ESOPHAGOGASTRODUODENOSCOPY (EGD) WITH PROPOFOL;  Surgeon: Trevor Harman, DO;  Location: [facility];  Service: Endoscopy;  Laterality: N/A;  2:00pm   GASTROSTOMY N/A 10/12/2021   Procedure: OPEN GASTROSTOMY TUBE;  Surgeon: Trevor Signs, MD;  Location: [facility] ORS;  Service: General;  Laterality: N/A;   PORTACATH PLACEMENT Left 10/12/2021   Procedure: INSERTION PORT-A-CATH;  Surgeon: Trevor Signs, MD;  Location: [facility] ORS;  Service: General;  Laterality: Left;    SOCIAL HISTORY:  Social History   Socioeconomic History   Marital status: Divorced    Spouse name: Not on file   Number of children: Not on file   Years of education: Not on file   Highest education level: Not on file  Occupational History   Not on file  Tobacco Use   Smoking status: Some Days    Packs/day: 0.50    Types: Cigarettes   Smokeless tobacco: Never  Vaping Use   Vaping Use: Never used  Substance and Sexual Activity   Alcohol use: Not Currently    Alcohol/week: 14.0 standard drinks of alcohol    Types: 14 Cans of beer per week    Comment: 2- 3 16 oz beer daily.   Drug use: Never   Sexual activity: Not on file  Other Topics Concern   Not on file  Social History Narrative   Not on file  Social Determinants of Health   Financial Resource Strain: High Risk (09/28/2021)   Overall Financial Resource Strain (CARDIA)    Difficulty of Paying Living Expenses: Hard  Food Insecurity: Not on file  Transportation Needs: Not on file  Physical Activity: Not on file  Stress: Not on file  Social Connections: Not on file  Intimate Partner Violence: Not on file    FAMILY HISTORY:  Family History  Problem Relation Age of Onset   Cancer Father        prostate   Cancer Sister    Colon cancer Neg Hx    Esophageal cancer Neg Hx     CURRENT MEDICATIONS:  Current  Outpatient Medications  Medication Sig Dispense Refill   allopurinol (ZYLOPRIM) 300 MG tablet Take 1 tablet (300 mg total) by mouth daily. 30 tablet 5   amLODipine (NORVASC) 5 MG tablet Take 1 tablet (5 mg total) by mouth daily. 30 tablet 1   fluorouracil CALGB 29518 2,400 mg/m2 in sodium chloride 0.9 % 150 mL Inject 2,400 mg/m2 into the vein over 48 hr. Every 2 weeks     FLUOROURACIL IV Inject into the vein every 14 (fourteen) days.     ibuprofen (ADVIL) 600 MG tablet Take 1 tablet (600 mg total) by mouth every 8 (eight) hours as needed. 90 tablet 1   LEUCOVORIN CALCIUM IV Inject into the vein every 14 (fourteen) days.     loperamide (IMODIUM) 2 MG capsule Take 1 capsule (2 mg total) by mouth as needed for diarrhea or loose stools (take as directed according to the instruction sheet you were given). (Patient not taking: Reported on 10/04/2021) 30 capsule 4   magnesium oxide (MAG-OX) 400 (240 Mg) MG tablet Take 1 tablet (400 mg total) by mouth 2 (two) times daily. 60 tablet 1   OXALIPLATIN IV Inject into the vein every 14 (fourteen) days.     potassium chloride SA (KLOR-CON M) 20 MEQ tablet Take 2 tablets (40 mEq total) by mouth daily. 60 tablet 3   prochlorperazine (COMPAZINE) 10 MG tablet Take 1 tablet (10 mg total) by mouth every 6 (six) hours as needed for nausea or vomiting. (Patient not taking: Reported on 10/04/2021) 30 tablet 6   Vitamin D, Ergocalciferol, (DRISDOL) 1.25 MG (50000 UNIT) CAPS capsule Take 1 capsule (50,000 Units total) by mouth every 7 (seven) days. (Patient taking differently: Take 50,000 Units by mouth every Monday.) 5 capsule 1   No current facility-administered medications for this visit.    ALLERGIES:  No Known Allergies  PHYSICAL EXAM:  Performance status (ECOG): 0 - Asymptomatic  There were no vitals filed for this visit. Wt Readings from Last 3 Encounters:  10/22/21 118 lb 3.2 oz (53.6 kg)  10/10/21 118 lb 3.2 oz (53.6 kg)  09/30/21 126 lb (57.2 kg)    Physical Exam  LABORATORY DATA:  I have reviewed the labs as listed.     Latest Ref Rng & Units 10/12/2021    4:52 AM 10/11/2021    4:58 AM 10/10/2021    8:49 AM  CBC  WBC 4.0 - 10.5 K/uL 13.5  13.4    Hemoglobin 13.0 - 17.0 g/dL 10.0  9.3  10.5   Hematocrit 39.0 - 52.0 % 30.6  29.0  31.0   Platelets 150 - 400 K/uL 274  239        Latest Ref Rng & Units 10/12/2021    4:52 AM 10/11/2021    4:58 AM 10/10/2021    8:34 PM  CMP  Glucose 70 - 99 mg/dL 99  115    BUN 6 - 20 mg/dL 8  13    Creatinine 0.61 - 1.24 mg/dL 0.50  0.51    Sodium 135 - 145 mmol/L 132  135    Potassium 3.5 - 5.1 mmol/L 3.7  3.1  3.3   Chloride 98 - 111 mmol/L 104  106    CO2 22 - 32 mmol/L 21  25    Calcium 8.9 - 10.3 mg/dL 8.9  8.9      DIAGNOSTIC IMAGING:  I have independently reviewed the scans and discussed with the patient. [place] CORE BIOPSY (SOFT TISSUE)  Result Date: 10/13/2021 INDICATION: 51 year old with esophageal cancer. Patient has suspicious intramuscular lesions based on recent PET-CT. Tissue diagnosis is needed. EXAM: ULTRASOUND-GUIDED BIOPSY AND ASPIRATION OF LEFT SHOULDER LESION MEDICATIONS: 1% lidocaine, local anesthetic ANESTHESIA/SEDATION: None FLUOROSCOPY TIME:  None COMPLICATIONS: None immediate. PROCEDURE: Informed written consent was obtained from the patient after a thorough discussion of the procedural risks, benefits and alternatives. All questions were addressed. A timeout was performed prior to the initiation of the procedure. Patient has a known lesion in the posterior left shoulder based on recent PET-CT. This area was evaluated with ultrasound. A partially cystic lesion was identified. The left posterior shoulder was prepped with chlorhexidine and sterile field was created. Skin and soft tissues were anesthetized with 1% lidocaine. Small incision was made. Using ultrasound guidance, 17 gauge coaxial needle was directed into the cystic component and 2 mL of cloudy fluid was aspirated. The  lesion was decompressed after the aspiration. Multiple core biopsies were obtained from the residual soft tissue component using an 18 gauge device. Specimens placed in formalin. Bandage placed over the puncture site. FINDINGS: 2.7 x 1.8 cm oval shaped lesion in the left posterior shoulder musculature. Central aspect of the lesion was necrotic or cystic. Approximately 2 mL of cloudy fluid was aspirated from the lesion and samples were sent for cytology and culture. Solid component was then biopsied with a 18 gauge core biopsy device. No immediate bleeding or hematoma formation. IMPRESSION: Ultrasound-guided aspiration and core biopsy of the left shoulder lesion. Electronically Signed   By: Trevor [name] M.D.   On: 10/13/2021 15:22   DG Chest Port 1 View  Result Date: 10/12/2021 CLINICAL DATA:  Port-A-Cath insertion. EXAM: PORTABLE CHEST 1 VIEW COMPARISON:  [facility] chest 05/30/2021 FINDINGS: New left chest wall porta catheter is seen with tip overlying the central superior vena cava. Cardiac silhouette is at the upper limits of normal size. Mediastinal contours are within normal limits. Mildly decreased lung volumes. Mild right inferior lung heterogeneous airspace opacity appears new from prior. The left lung is clear. No pleural effusion or pneumothorax. Mild multilevel degenerative disc changes of the thoracic spine. IMPRESSION: 1. New left chest wall porta catheter with tip overlying the central superior vena cava. No pneumothorax. 2. New mild right basilar heterogeneous airspace opacity may represent atelectasis. Pneumonia cannot be excluded. Electronically Signed   By: Trevor [name] M.D.   On: 10/12/2021 09:28   DG C-Arm 1-60 Min-No Report  Result Date: 10/12/2021 Fluoroscopy was utilized by the requesting physician.  No radiographic interpretation.     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 - 34 pound weight loss in the last 2  months. - EGD (08/26/2021): A large fungating ulcerating mass found in the lower third of the esophagus, 30 cm from incisors.  Nearly obstructing and circumferential.  Scope could not be advanced. - Pathology: Invasive moderately to poorly differentiated squamous cell carcinoma. - CT CAP with contrast (09/08/2021): Circumferential irregular wall thickening along the length of approximately 9 cm in the mid and distal thoracic esophagus.  Haziness in the periesophageal fat suggesting transmural extension of the tumor.  Small adjacent 6 mm paraesophageal lymph node.  1.3 cm endobronchial versus peribronchial smooth nodule adjacent to the segmental bronchus within the lingula.  Differential includes intrapulmonary lymph node/endobronchial neoplasm. - PET scan (09/22/2021): Left lung nodule PET positive.  Left deltoid uptake SUV 6.9, left erector spine musculature adjacent to L3 transverse process SUV 8.4, left gluteal activity with SUV 2.97.  Positive for large esophageal mass involving half to one third of the distal esophagus with upper abdominal nodal metastasis. - Left deltoid biopsy (10/13/2021): Keratinizing squamous cell carcinoma - FOLFOX + Nivolumab started on 10/26/2021 - [facility] NGS: PD-L1 CPS 15%, HER2 negative, MSI-stable    Social/family history: - He lives by himself.  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 - Current active smoker, half pack per day for 10 years.  He drinks beer daily. - Father had colon cancer and sister had stomach cancer.   PLAN:  Stage IV (T3N1 M1 G3) lower esophageal squamous cell carcinoma: - We reviewed the biopsy of the left deltoid which was consistent with metastatic cancer. - We reviewed NGS test results. - We discussed treatment intent in the palliative setting. - We discussed chemotherapy regimen with FOLFOX and nivolumab as first-line treatment given every 2 weeks for 6 cycles followed by restaging scans. - We discussed the side effects of this regimen in detail. -  Proceed with cycle 1 today.  RTC 2 weeks for follow-up.  2.  Weight loss: - He has PEG tube placed. - He is taking an Nutren 1.5, 4 cans/day.  He reports having diarrhea 10 minutes after tube feeds.  He is also drinking fruit smoothies.  He is having total of 6 watery bowel movements per day. - He has gained about 2 pounds since last visit. - He was told to use Imodium to control diarrhea and increase Nutren 1.5 to 5 cans/day.  3.  Hypokalemia: - Potassium today is 2.6.  He will have a potassium repletion in the office. - We will start him on potassium 40 mg daily. - He also has mild hypomagnesemia.  He will receive 2 g of IV magnesium.  4.  Microcytic anemia: - Hemoglobin is 9.0 with MCV 72.8.  Will check ferritin and iron panel. - If it is low, will consider parenteral iron therapy.   Orders placed this encounter:  No orders of the defined types were placed in this encounter.    Trevor Jack, MD [place] [phone]   I, Trevor [name], am acting as a scribe for Dr. Derek [name].  I, Trevor Jack MD, have reviewed the above documentation for accuracy and completeness, and I agree with the above.

## 2021-10-26 NOTE — Patient Instructions (Signed)
[facility]  Discharge Instructions: Thank you for choosing [facility] to provide your oncology and hematology care.  If you have a lab appointment with the [facility], please come in thru the Main Entrance and check in at the main information desk.  Wear comfortable clothing and clothing appropriate for easy access to any Portacath or PICC line.   We strive to give you quality time with your provider. You may need to reschedule your appointment if you arrive late (15 or more minutes).  Arriving late affects you and other patients whose appointments are after yours.  Also, if you miss three or more appointments without notifying the office, you may be dismissed from the clinic at the provider's discretion.      For prescription refill requests, have your pharmacy contact our office and allow 72 hours for refills to be completed.    Today you received the following chemotherapy and/or immunotherapy agents Opdivo/FOLFOX with pump start      To help prevent nausea and vomiting after your treatment, we encourage you to take your nausea medication as directed.  BELOW ARE SYMPTOMS THAT SHOULD BE REPORTED IMMEDIATELY: *FEVER GREATER THAN 100.4 F (38 C) OR HIGHER *CHILLS OR SWEATING *NAUSEA AND VOMITING THAT IS NOT CONTROLLED WITH YOUR NAUSEA MEDICATION *UNUSUAL SHORTNESS OF BREATH *UNUSUAL BRUISING OR BLEEDING *URINARY PROBLEMS (pain or burning when urinating, or frequent urination) *BOWEL PROBLEMS (unusual diarrhea, constipation, pain near the anus) TENDERNESS IN MOUTH AND THROAT WITH OR WITHOUT PRESENCE OF ULCERS (sore throat, sores in mouth, or a toothache) UNUSUAL RASH, SWELLING OR PAIN  UNUSUAL VAGINAL DISCHARGE OR ITCHING   Items with * indicate a potential emergency and should be followed up as soon as possible or go to the Emergency Department if any problems should occur.  Please show the CHEMOTHERAPY ALERT CARD or IMMUNOTHERAPY ALERT CARD at check-in to  the Emergency Department and triage nurse.  Should you have questions after your visit or need to cancel or reschedule your appointment, please contact [facility] [phone]  and follow the prompts.  Office hours are 8:00 a.m. to 4:30 p.m. Monday - Friday. Please note that voicemails left after 4:00 p.m. may not be returned until the following business day.  We are closed weekends and major holidays. You have access to a nurse at all times for urgent questions. Please call the main number to the clinic [phone] and follow the prompts.  For any non-urgent questions, you may also contact your provider using MyChart. We now offer e-Visits for anyone 53 and older to request care online for non-urgent symptoms. For details visit [email or web address].   Also download the MyChart app! Go to the app store, search "MyChart", open the app, select [facility], and log in with your MyChart username and password.  Masks are optional in the cancer centers. If you would like for your care team to wear a mask while they are taking care of you, please let them know. For doctor visits, patients may have with them one support person who is at least 53 years old. At this time, visitors are not allowed in the infusion area.

## 2021-10-26 NOTE — Progress Notes (Signed)
Subjective:     Trevor [name]  Here for follow up, status post open gastrostomy tube and Port-A-Cath placement.  Patient was seen in the emergency room due to some leakage around the gastrostomy tube site.  He is not using it and is having no problems with feeding himself otherwise. Objective:    BP (!) 166/105   Pulse (!) 107   Temp 97.9 F (36.6 C) (Oral)   Resp (!) 22   Ht '5\' 6"'$  (1.676 m)   Wt 124 lb (56.2 kg)   SpO2 98%   BMI 20.01 kg/m   General:  alert, cooperative, and no distress  Abdomen is soft, incision healing well.  Staples removed.  Gastrostomy tube bolster was at the 4.5 cm Trevor [name].  This was tightened to just below the 3 cm Trevor [name].  This resulted in no leakage.     Assessment:    Doing well postoperatively. Readjusted bolster.    Plan:   Follow-up here as needed.

## 2021-10-26 NOTE — Progress Notes (Signed)
CRITICAL VALUE ALERT Critical value received:  Potassium 2.6 Date of notification:  10-26-2021 Time of notification: 08:45 am.  Critical value read back:  Yes.   Nurse who received alert:  B. Haliyah Fryman RN MD notified time and response:  [place] 08:47am. Standing orders.

## 2021-10-26 NOTE — Progress Notes (Signed)
Patient presents today for D1, C1 Opdivo/FOLFOX with pump start per providers order.  Labs and vital signs reviewed by the MD.  BP noted to be 148/119, Norvasc given per providers order.  Message received from Adonis Huguenin RN/Dr. Delton Coombes patient okay for treatment.  Patient also to receive Potassium PO 40 mEq X 2, 20 mEq Potassium IV and 2 grams of IV magnesium.  Opdivo/FOLFOX/potassium/magnesium given today per MD orders.  Stable during infusion without adverse affects.  Vital signs stable.  Pump connected and verified RUN of the screen with the patient.  No complaints at this time.  Discharge from clinic ambulatory in stable condition.  Alert and oriented X 3.  Follow up with [facility] as scheduled.

## 2021-10-26 NOTE — Patient Instructions (Signed)
[facility] [place] at [facility] Discharge Instructions  You were seen and examined today by Dr. Delton Coombes.  Dr. Delton Coombes discussed your most biopsy results on your shoulder area. It did reveal metastatic cancer. This confirms that you have a Stage IV Esophageal Cancer.  Follow-up as scheduled.  Thank you for choosing [place] at [facility] to provide your oncology and hematology care.  To afford each patient quality time with our provider, please arrive at least 15 minutes before your scheduled appointment time.   If you have a lab appointment with the [facility] please come in thru the Main Entrance and check in at the main information desk.  You need to re-schedule your appointment should you arrive 10 or more minutes late.  We strive to give you quality time with our providers, and arriving late affects you and other patients whose appointments are after yours.  Also, if you no show three or more times for appointments you may be dismissed from the clinic at the providers discretion.     Again, thank you for choosing [facility].  Our hope is that these requests will decrease the amount of time that you wait before being seen by our physicians.       _____________________________________________________________  Should you have questions after your visit to [facility], please contact our office at [phone] and follow the prompts.  Our office hours are 8:00 a.m. and 4:30 p.m. Monday - Friday.  Please note that voicemails left after 4:00 p.m. may not be returned until the following business day.  We are closed weekends and major holidays.  You do have access to a nurse 24-7, just call the main number to the clinic [phone] and do not press any options, hold on the line and a nurse will answer the phone.    For prescription refill requests, have your pharmacy contact our office and allow 72 hours.

## 2021-10-27 ENCOUNTER — Encounter (HOSPITAL_COMMUNITY): Payer: Self-pay | Admitting: Hematology

## 2021-10-27 ENCOUNTER — Other Ambulatory Visit (HOSPITAL_COMMUNITY): Payer: Self-pay

## 2021-10-27 ENCOUNTER — Encounter (HOSPITAL_COMMUNITY): Payer: Self-pay

## 2021-10-27 ENCOUNTER — Inpatient Hospital Stay (HOSPITAL_COMMUNITY): Payer: 59 | Admitting: Dietician

## 2021-10-27 DIAGNOSIS — D509 Iron deficiency anemia, unspecified: Secondary | ICD-10-CM | POA: Insufficient documentation

## 2021-10-27 MED ORDER — PROCHLORPERAZINE MALEATE 10 MG PO TABS: 10.0000 mg | ORAL_TABLET | Freq: Four times a day (QID) | ORAL | 6 refills | Status: DC | PRN

## 2021-10-27 MED ORDER — NICOTINE 21 MG/24HR TD PT24: 21.0000 mg | MEDICATED_PATCH | Freq: Every day | TRANSDERMAL | 1 refills | Status: DC

## 2021-10-27 MED ORDER — POTASSIUM CHLORIDE CRYS ER 20 MEQ PO TBCR
40.0000 meq | EXTENDED_RELEASE_TABLET | Freq: Every day | ORAL | 3 refills | Status: DC
Start: 2021-10-27 — End: 2021-12-19

## 2021-10-27 NOTE — Progress Notes (Signed)
24 hour call back.  Patient feels great.  No nausea, vomiting, diarrhea, or any other chemo related issues.

## 2021-10-27 NOTE — Addendum Note (Signed)
Addended by: Derek Jack on: 10/27/2021 11:58 AM   Modules accepted: Orders

## 2021-10-27 NOTE — Progress Notes (Signed)
Nutrition Follow-up:  Patient with stage IV esophageal cancer. He is receiving FOLFOX + Nivolumab q14d (started 7/5). S/p open G-tube on 6/21  Met with patient in clinic. He reports feeling "great" s/p first chemotherapy yesterday. Patient reports leaking around tube. He is asking for more split guaze. Patient was seen 7/5 for post-op follow-up. Surgeon tightened bumper. Patient denies further leaking at visit today. Patient reports tolerating tube feedings. He is giving 5 cartons Nutren 1.5 via tube. Patient reports 2 loose bowel movements ~10-15 minutes after bolus of evening feeding. Patient reports giving 2 cartons in the morning, 2 cartons in the afternoon, and 1 before bed. Patient reports giving 16 oz bottle of water before and after each feeding (diarrhea likely related to reported bolus volume). He is drinking hot tea, water as well as some home made soups by mouth.    Medications: klor-con, compazine  Labs: 7/5 - K 2.6  Anthropometrics: Last weight 124 lb on 7/5 increased  6/19 - 118 lb 3.2 oz 6/06 - 126 lb  5/23 - 132 lb 6.4 oz 4/13 - 135 lb 12.8 oz 2/06 - 168 lb  Estimated Energy Needs  Kcals: 2020-2308 Protein: 98-110 Fluid: 2.3 L  NUTRITION DIAGNOSIS: Severe malnutrition continues   INTERVENTION:  Encouraged oral intake as tolerated Reviewed tube feeding regimen with patient Nutren 1.5 - 6 cartons split over 3 feedings daily. Flush tube with 60 ml water before and after each feeding. This provides 2250 kcal, 102 grams protein, 1146 ml free water from formula (1506 ml total water with flushes). Drink by mouth or give via tube additional 3 cups fluids/day   MONITORING, EVALUATION, GOAL: weight trends, intake, tube feeding, labs   NEXT VISIT: weekly follow-ups via telephone or clinic pending patient work schedule. Patient to call with updated work schedule today

## 2021-10-28 ENCOUNTER — Inpatient Hospital Stay (HOSPITAL_COMMUNITY): Payer: 59

## 2021-10-28 ENCOUNTER — Other Ambulatory Visit (HOSPITAL_COMMUNITY): Payer: Self-pay | Admitting: *Deleted

## 2021-10-28 VITALS — BP 128/92 | HR 96 | Temp 98.0°F | Resp 18

## 2021-10-28 DIAGNOSIS — C155 Malignant neoplasm of lower third of esophagus: Secondary | ICD-10-CM | POA: Diagnosis not present

## 2021-10-28 DIAGNOSIS — R911 Solitary pulmonary nodule: Secondary | ICD-10-CM | POA: Diagnosis not present

## 2021-10-28 DIAGNOSIS — Z5111 Encounter for antineoplastic chemotherapy: Secondary | ICD-10-CM | POA: Diagnosis not present

## 2021-10-28 DIAGNOSIS — C159 Malignant neoplasm of esophagus, unspecified: Secondary | ICD-10-CM

## 2021-10-28 DIAGNOSIS — Z5112 Encounter for antineoplastic immunotherapy: Secondary | ICD-10-CM | POA: Diagnosis not present

## 2021-10-28 DIAGNOSIS — E119 Type 2 diabetes mellitus without complications: Secondary | ICD-10-CM | POA: Diagnosis not present

## 2021-10-28 DIAGNOSIS — I10 Essential (primary) hypertension: Secondary | ICD-10-CM | POA: Diagnosis not present

## 2021-10-28 DIAGNOSIS — Z8042 Family history of malignant neoplasm of prostate: Secondary | ICD-10-CM | POA: Diagnosis not present

## 2021-10-28 DIAGNOSIS — D509 Iron deficiency anemia, unspecified: Secondary | ICD-10-CM | POA: Diagnosis not present

## 2021-10-28 DIAGNOSIS — R69 Illness, unspecified: Secondary | ICD-10-CM | POA: Diagnosis not present

## 2021-10-28 DIAGNOSIS — Z800 Family history of malignant neoplasm of digestive organs: Secondary | ICD-10-CM | POA: Diagnosis not present

## 2021-10-28 DIAGNOSIS — Z79899 Other long term (current) drug therapy: Secondary | ICD-10-CM | POA: Diagnosis not present

## 2021-10-28 DIAGNOSIS — R634 Abnormal weight loss: Secondary | ICD-10-CM | POA: Diagnosis not present

## 2021-10-28 DIAGNOSIS — E876 Hypokalemia: Secondary | ICD-10-CM | POA: Diagnosis not present

## 2021-10-28 MED ORDER — HEPARIN SOD (PORK) LOCK FLUSH 100 UNIT/ML IV SOLN
500.0000 [IU] | Freq: Once | INTRAVENOUS | Status: AC | PRN
  Administered 2021-10-28: 500 [IU]

## 2021-10-28 MED ORDER — SODIUM CHLORIDE 0.9% FLUSH
10.0000 mL | INTRAVENOUS | Status: DC | PRN
  Administered 2021-10-28: 10 mL

## 2021-10-28 NOTE — Patient Instructions (Signed)
[facility]  Discharge Instructions: Thank you for choosing [facility] to provide your oncology and hematology care.  If you have a lab appointment with the [facility], please come in thru the Main Entrance and check in at the main information desk.  Wear comfortable clothing and clothing appropriate for easy access to any Portacath or PICC line.   We strive to give you quality time with your provider. You may need to reschedule your appointment if you arrive late (15 or more minutes).  Arriving late affects you and other patients whose appointments are after yours.  Also, if you miss three or more appointments without notifying the office, you may be dismissed from the clinic at the provider's discretion.      For prescription refill requests, have your pharmacy contact our office and allow 72 hours for refills to be completed.    Today you received the following chemotherapy and/or immunotherapy agents pump DC and port flush      To help prevent nausea and vomiting after your treatment, we encourage you to take your nausea medication as directed.  BELOW ARE SYMPTOMS THAT SHOULD BE REPORTED IMMEDIATELY: *FEVER GREATER THAN 100.4 F (38 C) OR HIGHER *CHILLS OR SWEATING *NAUSEA AND VOMITING THAT IS NOT CONTROLLED WITH YOUR NAUSEA MEDICATION *UNUSUAL SHORTNESS OF BREATH *UNUSUAL BRUISING OR BLEEDING *URINARY PROBLEMS (pain or burning when urinating, or frequent urination) *BOWEL PROBLEMS (unusual diarrhea, constipation, pain near the anus) TENDERNESS IN MOUTH AND THROAT WITH OR WITHOUT PRESENCE OF ULCERS (sore throat, sores in mouth, or a toothache) UNUSUAL RASH, SWELLING OR PAIN  UNUSUAL VAGINAL DISCHARGE OR ITCHING   Items with * indicate a potential emergency and should be followed up as soon as possible or go to the Emergency Department if any problems should occur.  Please show the CHEMOTHERAPY ALERT CARD or IMMUNOTHERAPY ALERT CARD at check-in to the  Emergency Department and triage nurse.  Should you have questions after your visit or need to cancel or reschedule your appointment, please contact [facility] [phone]  and follow the prompts.  Office hours are 8:00 a.m. to 4:30 p.m. Monday - Friday. Please note that voicemails left after 4:00 p.m. may not be returned until the following business day.  We are closed weekends and major holidays. You have access to a nurse at all times for urgent questions. Please call the main number to the clinic [phone] and follow the prompts.  For any non-urgent questions, you may also contact your provider using MyChart. We now offer e-Visits for anyone 44 and older to request care online for non-urgent symptoms. For details visit [email or web address].   Also download the MyChart app! Go to the app store, search "MyChart", open the app, select [facility], and log in with your MyChart username and password.  Masks are optional in the cancer centers. If you would like for your care team to wear a mask while they are taking care of you, please let them know. For doctor visits, patients may have with them one support person who is at least 53 years old. At this time, visitors are not allowed in the infusion area.

## 2021-10-28 NOTE — Progress Notes (Signed)
Patient presents today for 5FU pump stop and disconnection after 46 hour continous infusion.   5FU pump deaccessed.  Patients port flushed without difficulty.  Good blood return noted with no bruising or swelling noted at site.  Needle removed intact.  Band aid applied.  VSS with discharge and left in satisfactory condition ambulatory with no s/s of distress noted.    ?

## 2021-10-31 ENCOUNTER — Ambulatory Visit (HOSPITAL_COMMUNITY): Payer: 59 | Admitting: Dietician

## 2021-10-31 NOTE — Progress Notes (Signed)
Nutrition Follow-up:  Patient with stage IV esophageal cancer. He is receiving FOLFOX + Nivolumab q14d (started 7/5). S/p open G-tube on 6/21  Met with patient in office. He reports skin around tube is irritated and asking for RD to look at tube. Removed split guaze, noted mild excoriation of skin along bottom area of bumper. Area cleaned and new split guaze applied. 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 He wore cloth apron over uniform allowing for area to become moist. Patient reports his supervisor has ordered a plastic apron for him to wear but this has not arrived yet. Patient reports eating a small portion of alfredo pasta today. This tasted good to him and denies swallowing difficulty. He continues giving 5 cartons of Nutren 1.5 split over 3 feedings. Patient reports diarrhea ~10-15 minutes after feedings. He reports giving 16 oz bottle of water before and after each feeding (diarrhea is likely related to reported bolus volume). Water flushes reviewed with patient, he endorses understanding. Patient has written instructions.    Medications: reviewed  Labs: no new labs for review   Anthropometrics: Last weight 124 lb on 7/5 increased    Estimated Energy Needs  Kcals: 2020-2308 Protein: 98-110 Fluid: 2.3 L  NUTRITION DIAGNOSIS: Severe malnutrition continues    INTERVENTION:  Oral intake as tolerated  Educated to keep PEG site covered clean and dry while working Continue 5 cartons Nutren 1.5 split over 3 feedings daily with 120 ml water flush per feeding. Drink by mouth or give via tube additional 720 ml (3 cups) water Will plan to switch to [employer] 1.4 once pt has finished with Nutren Provided 6 complimentary cases [employer] 1.4 today   MONITORING, EVALUATION, GOAL: weight trends, intake, tube feedings   NEXT VISIT: Monday July 17

## 2021-11-07 ENCOUNTER — Inpatient Hospital Stay (HOSPITAL_COMMUNITY): Payer: 59 | Admitting: Dietician

## 2021-11-07 NOTE — Progress Notes (Signed)
Patient did not show for nutrition appointment. Will attempt to contact patient via telephone Thursday July 19 for follow up.

## 2021-11-09 ENCOUNTER — Inpatient Hospital Stay (HOSPITAL_COMMUNITY): Payer: 59

## 2021-11-09 ENCOUNTER — Telehealth: Payer: Self-pay | Admitting: Dietician

## 2021-11-09 ENCOUNTER — Inpatient Hospital Stay (HOSPITAL_BASED_OUTPATIENT_CLINIC_OR_DEPARTMENT_OTHER): Payer: 59 | Admitting: Hematology

## 2021-11-09 ENCOUNTER — Encounter (HOSPITAL_COMMUNITY): Payer: Self-pay | Admitting: *Deleted

## 2021-11-09 VITALS — BP 162/92 | HR 57 | Temp 98.0°F | Resp 18

## 2021-11-09 DIAGNOSIS — Z8042 Family history of malignant neoplasm of prostate: Secondary | ICD-10-CM | POA: Diagnosis not present

## 2021-11-09 DIAGNOSIS — Z800 Family history of malignant neoplasm of digestive organs: Secondary | ICD-10-CM | POA: Diagnosis not present

## 2021-11-09 DIAGNOSIS — D500 Iron deficiency anemia secondary to blood loss (chronic): Secondary | ICD-10-CM

## 2021-11-09 DIAGNOSIS — R911 Solitary pulmonary nodule: Secondary | ICD-10-CM | POA: Diagnosis not present

## 2021-11-09 DIAGNOSIS — E876 Hypokalemia: Secondary | ICD-10-CM | POA: Diagnosis not present

## 2021-11-09 DIAGNOSIS — Z79899 Other long term (current) drug therapy: Secondary | ICD-10-CM | POA: Diagnosis not present

## 2021-11-09 DIAGNOSIS — C49A1 Gastrointestinal stromal tumor of esophagus: Secondary | ICD-10-CM | POA: Diagnosis not present

## 2021-11-09 DIAGNOSIS — C155 Malignant neoplasm of lower third of esophagus: Secondary | ICD-10-CM | POA: Diagnosis not present

## 2021-11-09 DIAGNOSIS — D509 Iron deficiency anemia, unspecified: Secondary | ICD-10-CM | POA: Diagnosis not present

## 2021-11-09 DIAGNOSIS — I10 Essential (primary) hypertension: Secondary | ICD-10-CM | POA: Diagnosis not present

## 2021-11-09 DIAGNOSIS — C159 Malignant neoplasm of esophagus, unspecified: Secondary | ICD-10-CM

## 2021-11-09 DIAGNOSIS — R634 Abnormal weight loss: Secondary | ICD-10-CM | POA: Diagnosis not present

## 2021-11-09 DIAGNOSIS — Z5111 Encounter for antineoplastic chemotherapy: Secondary | ICD-10-CM | POA: Diagnosis not present

## 2021-11-09 DIAGNOSIS — E119 Type 2 diabetes mellitus without complications: Secondary | ICD-10-CM | POA: Diagnosis not present

## 2021-11-09 DIAGNOSIS — Z5112 Encounter for antineoplastic immunotherapy: Secondary | ICD-10-CM | POA: Diagnosis not present

## 2021-11-09 DIAGNOSIS — R69 Illness, unspecified: Secondary | ICD-10-CM | POA: Diagnosis not present

## 2021-11-09 LAB — COMPREHENSIVE METABOLIC PANEL
ALT: 8 U/L (ref 0–44)
AST: 11 U/L — ABNORMAL LOW (ref 15–41)
Albumin: 2.6 g/dL — ABNORMAL LOW (ref 3.5–5.0)
Alkaline Phosphatase: 62 U/L (ref 38–126)
Anion gap: 6 (ref 5–15)
BUN: 19 mg/dL (ref 6–20)
CO2: 24 mmol/L (ref 22–32)
Calcium: 8.5 mg/dL — ABNORMAL LOW (ref 8.9–10.3)
Chloride: 110 mmol/L (ref 98–111)
Creatinine, Ser: 0.64 mg/dL (ref 0.61–1.24)
GFR, Estimated: >60 mL/min (ref >60)
Glucose, Bld: 100 mg/dL — ABNORMAL HIGH (ref 70–99)
Potassium: 3.3 mmol/L — ABNORMAL LOW (ref 3.5–5.1)
Sodium: 140 mmol/L (ref 135–145)
Total Bilirubin: 0.5 mg/dL (ref 0.3–1.2)
Total Protein: 6.4 g/dL — ABNORMAL LOW (ref 6.5–8.1)

## 2021-11-09 LAB — CBC WITH DIFFERENTIAL/PLATELET
Abs Immature Granulocytes: 0.03 10*3/uL (ref 0.00–0.07)
Basophils Absolute: 0 10*3/uL (ref 0.0–0.1)
Basophils Relative: 0 %
Eosinophils Absolute: 0.1 10*3/uL (ref 0.0–0.5)
Eosinophils Relative: 2 %
HCT: 27.3 % — ABNORMAL LOW (ref 39.0–52.0)
Hemoglobin: 8.8 g/dL — ABNORMAL LOW (ref 13.0–17.0)
Immature Granulocytes: 0 %
Lymphocytes Relative: 25 %
Lymphs Abs: 1.7 10*3/uL (ref 0.7–4.0)
MCH: 23.6 pg — ABNORMAL LOW (ref 26.0–34.0)
MCHC: 32.2 g/dL (ref 30.0–36.0)
MCV: 73.2 fL — ABNORMAL LOW (ref 80.0–100.0)
Monocytes Absolute: 0.7 10*3/uL (ref 0.1–1.0)
Monocytes Relative: 10 %
Neutro Abs: 4.2 10*3/uL (ref 1.7–7.7)
Neutrophils Relative %: 63 %
Platelets: 383 10*3/uL (ref 150–400)
RBC: 3.73 MIL/uL — ABNORMAL LOW (ref 4.22–5.81)
RDW: 17.9 % — ABNORMAL HIGH (ref 11.5–15.5)
WBC: 6.7 10*3/uL (ref 4.0–10.5)
nRBC: 0 % (ref 0.0–0.2)

## 2021-11-09 LAB — MAGNESIUM: Magnesium: 1.8 mg/dL (ref 1.7–2.4)

## 2021-11-09 MED ORDER — LEUCOVORIN CALCIUM INJECTION 350 MG
400.0000 mg/m2 | Freq: Once | INTRAVENOUS | Status: AC
  Administered 2021-11-09: 632 mg via INTRAVENOUS
  Filled 2021-11-09: qty 31.6

## 2021-11-09 MED ORDER — FLUOROURACIL CHEMO INJECTION 2.5 GM/50ML
400.0000 mg/m2 | Freq: Once | INTRAVENOUS | Status: AC
  Administered 2021-11-09: 650 mg via INTRAVENOUS
  Filled 2021-11-09: qty 13

## 2021-11-09 MED ORDER — PALONOSETRON HCL INJECTION 0.25 MG/5ML
0.2500 mg | Freq: Once | INTRAVENOUS | Status: AC
  Administered 2021-11-09: 0.25 mg via INTRAVENOUS
  Filled 2021-11-09: qty 5

## 2021-11-09 MED ORDER — SODIUM CHLORIDE 0.9 % IV SOLN
240.0000 mg | Freq: Once | INTRAVENOUS | Status: AC
  Administered 2021-11-09: 240 mg via INTRAVENOUS
  Filled 2021-11-09: qty 24

## 2021-11-09 MED ORDER — SODIUM CHLORIDE 0.9 % IV SOLN
300.0000 mg | Freq: Once | INTRAVENOUS | Status: AC
  Administered 2021-11-09: 300 mg via INTRAVENOUS
  Filled 2021-11-09: qty 300

## 2021-11-09 MED ORDER — DEXTROSE 5 % IV SOLN: Freq: Once | INTRAVENOUS | Status: AC

## 2021-11-09 MED ORDER — SODIUM CHLORIDE 0.9 % IV SOLN
10.0000 mg | Freq: Once | INTRAVENOUS | Status: AC
  Administered 2021-11-09: 10 mg via INTRAVENOUS
  Filled 2021-11-09: qty 10

## 2021-11-09 MED ORDER — OXALIPLATIN CHEMO INJECTION 100 MG/20ML
85.0000 mg/m2 | Freq: Once | INTRAVENOUS | Status: AC
  Administered 2021-11-09: 135 mg via INTRAVENOUS
  Filled 2021-11-09: qty 20

## 2021-11-09 MED ORDER — SODIUM CHLORIDE 0.9 % IV SOLN: Freq: Once | INTRAVENOUS | Status: AC

## 2021-11-09 MED ORDER — SODIUM CHLORIDE 0.9 % IV SOLN
2400.0000 mg/m2 | INTRAVENOUS | Status: DC
  Administered 2021-11-09: 3800 mg via INTRAVENOUS
  Filled 2021-11-09: qty 76

## 2021-11-09 MED ORDER — SODIUM CHLORIDE 0.9% FLUSH
10.0000 mL | INTRAVENOUS | Status: DC | PRN
  Administered 2021-11-09: 10 mL

## 2021-11-09 NOTE — Patient Instructions (Addendum)
[facility]  Discharge Instructions: Thank you for choosing [facility] to provide your oncology and hematology care.  If you have a lab appointment with the [facility], please come in thru the Main Entrance and check in at the main information desk.  Wear comfortable clothing and clothing appropriate for easy access to any Portacath or PICC line.   We strive to give you quality time with your provider. You may need to reschedule your appointment if you arrive late (15 or more minutes).  Arriving late affects you and other patients whose appointments are after yours.  Also, if you miss three or more appointments without notifying the office, you may be dismissed from the clinic at the provider's discretion.      For prescription refill requests, have your pharmacy contact our office and allow 72 hours for refills to be completed.    Today you received the following chemotherapy and/or immunotherapy agents Folfox and Venofer, chemo pump was connected with no alarms noted. The chemotherapy medication bag should finish at 46 hours, 96 hours, or 7 days. For example, if your pump is scheduled for 46 hours and it was put on at 4:00 p.m., it should finish at 2:00 p.m. the day it is scheduled to come off regardless of your appointment time.     Estimated time to finish at 1230.   If the display on your pump reads "Low Volume" and it is beeping, take the batteries out of the pump and come to the cancer center for it to be taken off.   If the pump alarms go off prior to the pump reading "Low Volume" then call [phone] and someone can assist you.  If the plunger comes out and the chemotherapy medication is leaking out, please use your home chemo spill kit to clean up the spill. Do NOT use paper towels or other household products.  If you have problems or questions regarding your pump, please call either [phone] (24 hours a day) or the cancer center Monday-Friday  8:00 a.m.- 4:30 p.m. at the clinic number and we will assist you. If you are unable to get assistance, then go to the nearest Emergency Department and ask the staff to contact the IV team for assistance.   Nivolumab injection What is this medication? NIVOLUMAB (nye VOL ue mab) is a monoclonal antibody. It treats certain types of cancer. Some of the cancers treated are colon cancer, head and neck cancer, Hodgkin lymphoma, lung cancer, and melanoma. This medicine may be used for other purposes; ask your health care provider or pharmacist if you have questions. COMMON BRAND NAME(S): Opdivo What should I tell my care team before I take this medication? They need to know if you have any of these conditions: Autoimmune diseases such as Crohn's disease, ulcerative colitis, or lupus Have had or planning to have an allogeneic stem cell transplant (uses someone else's stem cells) History of chest radiation Organ transplant Nervous system problems such as myasthenia gravis or Guillain-Barre syndrome An unusual or allergic reaction to nivolumab, other medicines, foods, dyes, or preservatives Pregnant or trying to get pregnant Breast-feeding How should I use this medication? This medication is injected into a vein. It is given in a hospital or clinic setting. A special MedGuide will be given to you before each treatment. Be sure to read this information carefully each time. Talk to your care team regarding the use of this medication in children. While it may be prescribed for children  as young as 12 years for selected conditions, precautions do apply. Overdosage: If you think you have taken too much of this medicine contact a poison control center or emergency room at once. NOTE: This medicine is only for you. Do not share this medicine with others. What if I miss a dose? Keep appointments for follow-up doses. It is important not to miss your dose. Call your care team if you are unable to keep an  appointment. What may interact with this medication? Interactions have not been studied. This list may not describe all possible interactions. Give your health care provider a list of all the medicines, herbs, non-prescription drugs, or dietary supplements you use. Also tell them if you smoke, drink alcohol, or use illegal drugs. Some items may interact with your medicine. What should I watch for while using this medication? Your condition will be monitored carefully while you are receiving this medication. You may need blood work done while you are taking this medication. Do not become pregnant while taking this medication or for 5 months after stopping it. Women should inform their care team if they wish to become pregnant or think they might be pregnant. There is a potential for serious harm to an unborn child. Talk to your care team for more information. Do not breast-feed an infant while taking this medication or for 5 months after stopping it. What side effects may I notice from receiving this medication? Side effects that you should report to your care team as soon as possible: Allergic reactions--skin rash, itching, hives, swelling of the face, lips, tongue, or throat Bloody or black, tar-like stools Change in vision Chest pain Diarrhea Dry cough, shortness of breath or trouble breathing Eye pain Fast or irregular heartbeat Fever, chills High blood sugar (hyperglycemia)--increased thirst or amount of urine, unusual weakness or fatigue, blurry vision High thyroid levels (hyperthyroidism)--fast or irregular heartbeat, weight loss, excessive sweating or sensitivity to heat, tremors or shaking, anxiety, nervousness, irregular menstrual cycle or spotting Kidney injury--decrease in the amount of urine, swelling of the ankles, hands, or feet Liver injury--right upper belly pain, loss of appetite, nausea, light-colored stool, dark yellow or brown urine, yellowing skin or eyes, unusual weakness  or fatigue Low red blood cell count--unusual weakness or fatigue, dizziness, headache, trouble breathing Low thyroid levels (hypothyroidism)--unusual weakness or fatigue, increased sensitivity to cold, constipation, hair loss, dry skin, weight gain, feelings of depression Mood and behavior changes-confusion, change in sex drive or performance, irritability Muscle pain or cramps Pain, tingling, or numbness in the hands or feet, muscle weakness, trouble walking, loss of balance or coordination Red or dark brown urine Redness, blistering, peeling, or loosening of the skin, including inside the mouth Stomach pain Unusual bruising or bleeding Side effects that usually do not require medical attention (report to your care team if they continue or are bothersome): Bone pain Constipation Loss of appetite Nausea Tiredness Vomiting This list may not describe all possible side effects. Call your doctor for medical advice about side effects. You may report side effects to FDA at 1-800-FDA-1088. Where should I keep my medication? This medication is given in a hospital or clinic and will not be stored at home. NOTE: This sheet is a summary. It may not cover all possible information. If you have questions about this medicine, talk to your doctor, pharmacist, or health care provider.  2023 Elsevier/Gold Standard (2021-03-11 00:00:00) Oxaliplatin Injection What is this medication? OXALIPLATIN (ox AL i PLA tin) is a chemotherapy drug. It targets  fast dividing cells, like cancer cells, and causes these cells to die. This medicine is used to treat cancers of the colon and rectum, and many other cancers. This medicine may be used for other purposes; ask your health care provider or pharmacist if you have questions. COMMON BRAND NAME(S): Eloxatin What should I tell my care team before I take this medication? They need to know if you have any of these conditions: heart disease history of irregular  heartbeat liver disease low blood counts, like white cells, platelets, or red blood cells lung or breathing disease, like asthma take medicines that treat or prevent blood clots tingling of the fingers or toes, or other nerve disorder an unusual or allergic reaction to oxaliplatin, other chemotherapy, other medicines, foods, dyes, or preservatives pregnant or trying to get pregnant breast-feeding How should I use this medication? This drug is given as an infusion into a vein. It is administered in a hospital or clinic by a specially trained health care professional. Talk to your pediatrician regarding the use of this medicine in children. Special care may be needed. Overdosage: If you think you have taken too much of this medicine contact a poison control center or emergency room at once. NOTE: This medicine is only for you. Do not share this medicine with others. What if I miss a dose? It is important not to miss a dose. Call your doctor or health care professional if you are unable to keep an appointment. What may interact with this medication? Do not take this medicine with any of the following medications: cisapride dronedarone pimozide thioridazine This medicine may also interact with the following medications: aspirin and aspirin-like medicines certain medicines that treat or prevent blood clots like warfarin, apixaban, dabigatran, and rivaroxaban cisplatin cyclosporine diuretics medicines for infection like acyclovir, adefovir, amphotericin B, bacitracin, cidofovir, foscarnet, ganciclovir, gentamicin, pentamidine, vancomycin NSAIDs, medicines for pain and inflammation, like ibuprofen or naproxen other medicines that prolong the QT interval (an abnormal heart rhythm) pamidronate zoledronic acid This list may not describe all possible interactions. Give your health care provider a list of all the medicines, herbs, non-prescription drugs, or dietary supplements you use. Also tell  them if you smoke, drink alcohol, or use illegal drugs. Some items may interact with your medicine. What should I watch for while using this medication? Your condition will be monitored carefully while you are receiving this medicine. You may need blood work done while you are taking this medicine. This medicine may make you feel generally unwell. This is not uncommon as chemotherapy can affect healthy cells as well as cancer cells. Report any side effects. Continue your course of treatment even though you feel ill unless your healthcare professional tells you to stop. This medicine can make you more sensitive to cold. Do not drink cold drinks or use ice. Cover exposed skin before coming in contact with cold temperatures or cold objects. When out in cold weather wear warm clothing and cover your mouth and nose to warm the air that goes into your lungs. Tell your doctor if you get sensitive to the cold. Do not become pregnant while taking this medicine or for 9 months after stopping it. Women should inform their health care professional if they wish to become pregnant or think they might be pregnant. Men should not father a child while taking this medicine and for 6 months after stopping it. There is potential for serious side effects to an unborn child. Talk to your health care professional for  more information. Do not breast-feed a child while taking this medicine or for 3 months after stopping it. This medicine has caused ovarian failure in some women. This medicine may make it more difficult to get pregnant. Talk to your health care professional if you are concerned about your fertility. This medicine has caused decreased sperm counts in some men. This may make it more difficult to father a child. Talk to your health care professional if you are concerned about your fertility. This medicine may increase your risk of getting an infection. Call your health care professional for advice if you get a fever,  chills, or sore throat, or other symptoms of a cold or flu. Do not treat yourself. Try to avoid being around people who are sick. Avoid taking medicines that contain aspirin, acetaminophen, ibuprofen, naproxen, or ketoprofen unless instructed by your health care professional. These medicines may hide a fever. Be careful brushing or flossing your teeth or using a toothpick because you may get an infection or bleed more easily. If you have any dental work done, tell your dentist you are receiving this medicine. What side effects may I notice from receiving this medication? Side effects that you should report to your doctor or health care professional as soon as possible: allergic reactions like skin rash, itching or hives, swelling of the face, lips, or tongue breathing problems cough low blood counts - this medicine may decrease the number of white blood cells, red blood cells, and platelets. You may be at increased risk for infections and bleeding nausea, vomiting pain, redness, or irritation at site where injected pain, tingling, numbness in the hands or feet signs and symptoms of bleeding such as bloody or black, tarry stools; red or dark brown urine; spitting up blood or brown material that looks like coffee grounds; red spots on the skin; unusual bruising or bleeding from the eyes, gums, or nose signs and symptoms of a dangerous change in heartbeat or heart rhythm like chest pain; dizziness; fast, irregular heartbeat; palpitations; feeling faint or lightheaded; falls signs and symptoms of infection like fever; chills; cough; sore throat; pain or trouble passing urine signs and symptoms of liver injury like dark yellow or brown urine; general ill feeling or flu-like symptoms; light-colored stools; loss of appetite; nausea; right upper belly pain; unusually weak or tired; yellowing of the eyes or skin signs and symptoms of low red blood cells or anemia such as unusually weak or tired; feeling faint  or lightheaded; falls signs and symptoms of muscle injury like dark urine; trouble passing urine or change in the amount of urine; unusually weak or tired; muscle pain; back pain Side effects that usually do not require medical attention (report to your doctor or health care professional if they continue or are bothersome): changes in taste diarrhea gas hair loss loss of appetite mouth sores This list may not describe all possible side effects. Call your doctor for medical advice about side effects. You may report side effects to FDA at 1-800-FDA-1088. Where should I keep my medication? This drug is given in a hospital or clinic and will not be stored at home. NOTE: This sheet is a summary. It may not cover all possible information. If you have questions about this medicine, talk to your doctor, pharmacist, or health care provider.  2023 Elsevier/Gold Standard (2021-03-11 00:00:00)     To help prevent nausea and vomiting after your treatment, we encourage you to take your nausea medication as directed.  BELOW ARE SYMPTOMS  THAT SHOULD BE REPORTED IMMEDIATELY: *FEVER GREATER THAN 100.4 F (38 C) OR HIGHER *CHILLS OR SWEATING *NAUSEA AND VOMITING THAT IS NOT CONTROLLED WITH YOUR NAUSEA MEDICATION *UNUSUAL SHORTNESS OF BREATH *UNUSUAL BRUISING OR BLEEDING *URINARY PROBLEMS (pain or burning when urinating, or frequent urination) *BOWEL PROBLEMS (unusual diarrhea, constipation, pain near the anus) TENDERNESS IN MOUTH AND THROAT WITH OR WITHOUT PRESENCE OF ULCERS (sore throat, sores in mouth, or a toothache) UNUSUAL RASH, SWELLING OR PAIN  UNUSUAL VAGINAL DISCHARGE OR ITCHING   Items with * indicate a potential emergency and should be followed up as soon as possible or go to the Emergency Department if any problems should occur.  Please show the CHEMOTHERAPY ALERT CARD or IMMUNOTHERAPY ALERT CARD at check-in to the Emergency Department and triage nurse.  Should you have questions after  your visit or need to cancel or reschedule your appointment, please contact [facility] [phone]  and follow the prompts.  Office hours are 8:00 a.m. to 4:30 p.m. Monday - Friday. Please note that voicemails left after 4:00 p.m. may not be returned until the following business day.  We are closed weekends and major holidays. You have access to a nurse at all times for urgent questions. Please call the main number to the clinic [phone] and follow the prompts.  For any non-urgent questions, you may also contact your provider using MyChart. We now offer e-Visits for anyone 66 and older to request care online for non-urgent symptoms. For details visit [email or web address].   Also download the MyChart app! Go to the app store, search "MyChart", open the app, select [facility], and log in with your MyChart username and password.  Masks are optional in the cancer centers. If you would like for your care team to wear a mask while they are taking care of you, please let them know. For doctor visits, patients may have with them one support person who is at least 53 years old. At this time, visitors are not allowed in the infusion area.

## 2021-11-09 NOTE — Telephone Encounter (Signed)
Attempted to contact patient via telephone. Patient did not answer. Left VM with contact information.

## 2021-11-09 NOTE — Progress Notes (Signed)
Patient presents today for treatment, per Dr. Tomie China orders, give venofer '300mg'$  before treatment and Doctors visit. Patient tolerated iron infusion with no complaints voiced. Peripheral IV site clean and dry with good blood return noted before and after infusion.  Patient tolerated chemotherapy with no complaints voiced. Side effects with management reviewed understanding verbalized. Port site clean and dry with no bruising or swelling noted at site. Good blood return noted before and after administration of chemotherapy. Chemo pump connected with no alarms noted. Patient left in satisfactory condition with VSS and no s/s of distress noted.

## 2021-11-09 NOTE — Patient Instructions (Addendum)
[place] at [facility] Discharge Instructions   You were seen and examined today by Dr. Delton Coombes.  He reviewed the results of your lab work which are normal/stable. Your potassium is slightly low at 3.3. Your hemoglobin is low at 8.8. We are giving you iron today to help improve this.   We will proceed with your treatment today.  Return as scheduled.    Thank you for choosing [place] at [facility] to provide your oncology and hematology care.  To afford each patient quality time with our provider, please arrive at least 15 minutes before your scheduled appointment time.   If you have a lab appointment with the [facility] please come in thru the Main Entrance and check in at the main information desk.  You need to re-schedule your appointment should you arrive 10 or more minutes late.  We strive to give you quality time with our providers, and arriving late affects you and other patients whose appointments are after yours.  Also, if you no show three or more times for appointments you may be dismissed from the clinic at the providers discretion.     Again, thank you for choosing [facility].  Our hope is that these requests will decrease the amount of time that you wait before being seen by our physicians.       _____________________________________________________________  Should you have questions after your visit to [facility], please contact our office at [phone] and follow the prompts.  Our office hours are 8:00 a.m. and 4:30 p.m. Monday - Friday.  Please note that voicemails left after 4:00 p.m. may not be returned until the following business day.  We are closed weekends and major holidays.  You do have access to a nurse 24-7, just call the main number to the clinic [phone] and do not press any options, hold on the line and a nurse will answer the phone.    For prescription refill requests,  have your pharmacy contact our office and allow 72 hours.    Due to Covid, you will need to wear a mask upon entering the hospital. If you do not have a mask, a mask will be given to you at the Main Entrance upon arrival. For doctor visits, patients may have 1 support person age 30 or older with them. For treatment visits, patients can not have anyone with them due to social distancing guidelines and our immunocompromised population.

## 2021-11-09 NOTE — Progress Notes (Signed)
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  PRIOR THERAPY: none  NGS Results: not done  CURRENT THERAPY: FOLFOX + Nivolumab q14d  BRIEF ONCOLOGIC HISTORY:  Oncology History  Squamous cell esophageal cancer ([place])  09/13/2021 Initial Diagnosis   Squamous cell esophageal cancer ([place])   10/26/2021 -  Chemotherapy   Patient is on Treatment Plan : GASTROESOPHAGEAL FOLFOX + Nivolumab q14d       CANCER STAGING:  Cancer Staging  Squamous cell esophageal cancer ([place]) Staging form: Esophagus - Squamous Cell Carcinoma, AJCC 8th Edition - Clinical stage from 09/13/2021: Stage IVB (cT3, cN1, cM1, G3, L: Lower) - Unsigned - Pathologic: No stage assigned - Unsigned   INTERVAL HISTORY:  Mr. Trevor [name], a 51 y.o. male, returns for routine follow-up and consideration for next cycle of chemotherapy. Trevor [name] was last seen on 10/26/2021.  Due for cycle #2 of FOLFOX + Nivolumab today.   Overall, he tells me he has been feeling pretty well. He has lost 3 lbs since his last visit. He reports fatigue during midday. He continues to eat 1 meal daily by mouth, and he reports he is using 4 cans of [facility] by PEG tube daily. He reports nausea which is helped by Compazine. He denies vomiting and numbness/tingling. He reports cold sensitivity. He is taking Potassium BID. He denies diarrhea and constipation. He reports 3 BM daily. He reports dry skin on his right shin.   Overall, he feels ready for next cycle of chemo today.    REVIEW OF SYSTEMS:  Review of Systems  Constitutional:  Positive for fatigue (occasional). Negative for appetite change.  Respiratory:  Positive for cough and shortness of breath.   Gastrointestinal:  Positive for abdominal pain (5/10 L side)  and nausea. Negative for constipation, diarrhea and vomiting.  Neurological:  Negative for numbness.  All other systems reviewed and are negative.   PAST MEDICAL/SURGICAL HISTORY:  Past Medical History:  Diagnosis Date   Diabetes mellitus without complication ([place])    Esophageal cancer ([place])    Hypertension    Past Surgical History:  Procedure Laterality Date   BIOPSY  08/26/2021   Procedure: BIOPSY;  Surgeon: Eloise Harman, DO;  Location: [facility] ENDO SUITE;  Service: Endoscopy;;   ESOPHAGOGASTRODUODENOSCOPY (EGD) WITH PROPOFOL N/A 08/26/2021   Procedure: ESOPHAGOGASTRODUODENOSCOPY (EGD) WITH PROPOFOL;  Surgeon: Eloise Harman, DO;  Location: [facility] ENDO SUITE;  Service: Endoscopy;  Laterality: N/A;  2:00pm   GASTROSTOMY N/A 10/12/2021   Procedure: OPEN GASTROSTOMY TUBE;  Surgeon: Aviva Signs, MD;  Location: [facility] ORS;  Service: General;  Laterality: N/A;   PORTACATH PLACEMENT Left 10/12/2021   Procedure: INSERTION PORT-A-CATH;  Surgeon: Aviva Signs, MD;  Location: [facility] ORS;  Service: General;  Laterality: Left;    SOCIAL HISTORY:  Social History   Socioeconomic History   Marital status: Divorced    Spouse name: Not on file   Number of children: Not on file   Years of education: Not on file   Highest education level: Not on file  Occupational History   Not on file  Tobacco Use   Smoking status: Some Days    Packs/day: 0.50    Types: Cigarettes   Smokeless tobacco: Never  Vaping Use   Vaping Use:  Never used  Substance and Sexual Activity   Alcohol use: Not Currently    Alcohol/week: 14.0 standard drinks of alcohol    Types: 14 Cans of beer per week    Comment: 2- 3 16 oz beer daily.   Drug use: Never   Sexual activity: Not on file  Other Topics Concern   Not on file  Social History Narrative   Not on file   Social Determinants of Health   Financial Resource Strain: High Risk (09/28/2021)   Overall Financial Resource Strain (CARDIA)    Difficulty of Paying Living  Expenses: Hard  Food Insecurity: Not on file  Transportation Needs: Not on file  Physical Activity: Not on file  Stress: Not on file  Social Connections: Not on file  Intimate Partner Violence: Not on file    FAMILY HISTORY:  Family History  Problem Relation Age of Onset   Cancer Father        prostate   Cancer Sister    Colon cancer Neg Hx    Esophageal cancer Neg Hx     CURRENT MEDICATIONS:  Current Outpatient Medications  Medication Sig Dispense Refill   allopurinol (ZYLOPRIM) 300 MG tablet Take 1 tablet (300 mg total) by mouth daily. 30 tablet 5   amLODipine (NORVASC) 5 MG tablet Take 1 tablet (5 mg total) by mouth daily. 30 tablet 1   fluorouracil CALGB 34742 2,400 mg/m2 in sodium chloride 0.9 % 150 mL Inject 2,400 mg/m2 into the vein over 48 hr. Every 2 weeks     FLUOROURACIL IV Inject into the vein every 14 (fourteen) days.     ibuprofen (ADVIL) 600 MG tablet Take 1 tablet (600 mg total) by mouth every 8 (eight) hours as needed. 90 tablet 1   LEUCOVORIN CALCIUM IV Inject into the vein every 14 (fourteen) days.     loperamide (IMODIUM) 2 MG capsule Take 1 capsule (2 mg total) by mouth as needed for diarrhea or loose stools (take as directed according to the instruction sheet you were given). 30 capsule 4   magnesium oxide (MAG-OX) 400 (240 Mg) MG tablet Take 1 tablet (400 mg total) by mouth 2 (two) times daily. 60 tablet 1   nicotine (NICODERM CQ - DOSED IN MG/24 HOURS) 21 mg/24hr patch Place 1 patch (21 mg total) onto the skin daily. 28 patch 1   OXALIPLATIN IV Inject into the vein every 14 (fourteen) days.     potassium chloride SA (KLOR-CON M) 20 MEQ tablet Take 2 tablets (40 mEq total) by mouth daily. 60 tablet 3   prochlorperazine (COMPAZINE) 10 MG tablet Take 1 tablet (10 mg total) by mouth every 6 (six) hours as needed for nausea or vomiting. 30 tablet 6   Vitamin D, Ergocalciferol, (DRISDOL) 1.25 MG (50000 UNIT) CAPS capsule Take 1 capsule (50,000 Units total) by mouth  every 7 (seven) days. (Patient taking differently: Take 50,000 Units by mouth every Monday.) 5 capsule 1   No current facility-administered medications for this visit.   Facility-Administered Medications Ordered in Other Visits  Medication Dose Route Frequency Provider Last Rate Last Admin   iron sucrose (VENOFER) 300 mg in sodium chloride 0.9 % 250 mL IVPB  300 mg Intravenous Once Derek Jack, MD        ALLERGIES:  No Known Allergies  PHYSICAL EXAM:  Performance status (ECOG): 0 - Asymptomatic  There were no vitals filed for this visit. Wt Readings from Last 3 Encounters:  11/09/21 121 lb 12.8 oz (55.2  kg)  10/26/21 124 lb (56.2 kg)  10/26/21 121 lb (54.9 kg)   Physical Exam Vitals reviewed.  Constitutional:      Appearance: Normal appearance.  Cardiovascular:     Rate and Rhythm: Normal rate and regular rhythm.     Pulses: Normal pulses.     Heart sounds: Normal heart sounds.  Pulmonary:     Effort: Pulmonary effort is normal.     Breath sounds: Normal breath sounds.  Neurological:     General: No focal deficit present.     Mental Status: He is alert and oriented to person, place, and time.  Psychiatric:        Mood and Affect: Mood normal.        Behavior: Behavior normal.     LABORATORY DATA:  I have reviewed the labs as listed.     Latest Ref Rng & Units 10/26/2021    8:09 AM 10/12/2021    4:52 AM 10/11/2021    4:58 AM  CBC  WBC 4.0 - 10.5 K/uL 10.2  13.5  13.4   Hemoglobin 13.0 - 17.0 g/dL 9.0  10.0  9.3   Hematocrit 39.0 - 52.0 % 27.3  30.6  29.0   Platelets 150 - 400 K/uL 386  274  239       Latest Ref Rng & Units 10/26/2021    8:09 AM 10/12/2021    4:52 AM 10/11/2021    4:58 AM  CMP  Glucose 70 - 99 mg/dL 88  99  115   BUN 6 - 20 mg/dL _0 Creatinine 0.61 - 1.24 mg/dL 0.53  0.50  0.51   Sodium 135 - 145 mmol/L 138  132  135   Potassium 3.5 - 5.1 mmol/L 2.6  3.7  3.1   Chloride 98 - 111 mmol/L 104  104  106   CO2 22 - 32 mmol/L _1 Calcium 8.9 - 10.3 mg/dL 9.2  8.9  8.9   Total Protein 6.5 - 8.1 g/dL 6.5     Total Bilirubin 0.3 - 1.2 mg/dL 0.6     Alkaline Phos 38 - 126 U/L 52     AST 15 - 41 U/L 10     ALT 0 - 44 U/L 7       DIAGNOSTIC IMAGING:  I have independently reviewed the scans and discussed with the patient. [place] CORE BIOPSY (SOFT TISSUE)  Result Date: 10/13/2021 INDICATION: 51 year old with esophageal cancer. Patient has suspicious intramuscular lesions based on recent PET-CT. Tissue diagnosis is needed. EXAM: ULTRASOUND-GUIDED BIOPSY AND ASPIRATION OF LEFT SHOULDER LESION MEDICATIONS: 1% lidocaine, local anesthetic ANESTHESIA/SEDATION: None FLUOROSCOPY TIME:  None COMPLICATIONS: None immediate. PROCEDURE: Informed written consent was obtained from the patient after a thorough discussion of the procedural risks, benefits and alternatives. All questions were addressed. A timeout was performed prior to the initiation of the procedure. Patient has a known lesion in the posterior left shoulder based on recent PET-CT. This area was evaluated with ultrasound. A partially cystic lesion was identified. The left posterior shoulder was prepped with chlorhexidine and sterile field was created. Skin and soft tissues were anesthetized with 1% lidocaine. Small incision was made. Using ultrasound guidance, 17 gauge coaxial needle was directed into the cystic component and 2 mL of cloudy fluid was aspirated. The lesion was decompressed after the aspiration. Multiple core biopsies were obtained from the residual soft tissue component using an 18 gauge device. Specimens  placed in formalin. Bandage placed over the puncture site. FINDINGS: 2.7 x 1.8 cm oval shaped lesion in the left posterior shoulder musculature. Central aspect of the lesion was necrotic or cystic. Approximately 2 mL of cloudy fluid was aspirated from the lesion and samples were sent for cytology and culture. Solid component was then biopsied with a 18 gauge core  biopsy device. No immediate bleeding or hematoma formation. IMPRESSION: Ultrasound-guided aspiration and core biopsy of the left shoulder lesion. Electronically Signed   By: Markus Daft M.D.   On: 10/13/2021 15:22   DG Chest Port 1 View  Result Date: 10/12/2021 CLINICAL DATA:  Port-A-Cath insertion. EXAM: PORTABLE CHEST 1 VIEW COMPARISON:  AP chest 05/30/2021 FINDINGS: New left chest wall porta catheter is seen with tip overlying the central superior vena cava. Cardiac silhouette is at the upper limits of normal size. Mediastinal contours are within normal limits. Mildly decreased lung volumes. Mild right inferior lung heterogeneous airspace opacity appears new from prior. The left lung is clear. No pleural effusion or pneumothorax. Mild multilevel degenerative disc changes of the thoracic spine. IMPRESSION: 1. New left chest wall porta catheter with tip overlying the central superior vena cava. No pneumothorax. 2. New mild right basilar heterogeneous airspace opacity may represent atelectasis. Pneumonia cannot be excluded. Electronically Signed   By: Yvonne Kendall M.D.   On: 10/12/2021 09:28   DG C-Arm 1-60 Min-No Report  Result Date: 10/12/2021 Fluoroscopy was utilized by the requesting physician.  No radiographic interpretation.     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 - 34 pound weight loss in the last 2 months. - EGD (08/26/2021): A large fungating ulcerating mass found in the lower third of the esophagus, 30 cm from incisors.  Nearly obstructing and circumferential.  Scope could not be advanced. - Pathology: Invasive moderately to poorly differentiated squamous cell carcinoma. - CT CAP with contrast (09/08/2021): Circumferential irregular wall thickening along the length of approximately 9 cm in the mid and distal thoracic esophagus.  Haziness in the periesophageal fat suggesting transmural extension of the tumor.  Small  adjacent 6 mm paraesophageal lymph node.  1.3 cm endobronchial versus peribronchial smooth nodule adjacent to the segmental bronchus within the lingula.  Differential includes intrapulmonary lymph node/endobronchial neoplasm. - PET scan (09/22/2021): Left lung nodule PET positive.  Left deltoid uptake SUV 6.9, left erector spine musculature adjacent to L3 transverse process SUV 8.4, left gluteal activity with SUV 2.97.  Positive for large esophageal mass involving half to one third of the distal esophagus with upper abdominal nodal metastasis. - Left deltoid biopsy (10/13/2021): Keratinizing squamous cell carcinoma - FOLFOX + Nivolumab started on 10/26/2021 - [facility] NGS: PD-L1 CPS 15%, HER2 negative, MSI-stable    Social/family history: - He lives by himself.  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 - Current active smoker, half pack per day for 10 years.  He drinks beer daily. - Father had colon cancer and sister had stomach cancer.   PLAN:  Stage IV (T3N1 M1 G3) lower esophageal squamous cell carcinoma: - He has tolerated first cycle of FOLFOX with nivolumab reasonably well. - He continued working and felt fatigued by the end of the day. - He had experienced cold sensitivity in the mouth when he tried to drink cold fluids.  Occasional nausea is well controlled with Compazine.  No tingling or numbness in extremities.  He is having 3 bowel movements per day, oatmeal consistency. -  Reviewed labs today which showed normal LFTs.  CBC shows normal white count and platelet count.  Proceed with cycle 2 without any dose modifications.  RTC 2 weeks for follow-up with repeat labs and treatment.  2.  Weight loss: - He is taking an Nutren 1.5, 4 cans/day. - He is eating dinner by mouth.  He lost about 3 pounds.  We will closely monitor.  If continues to lose, will increase his to 5 cans/day.  3.  Hypokalemia: - Continue potassium 40 mEq daily.  Potassium today is 3.3.  4.  Microcytic anemia: - Hemoglobin is  8.8.  He will receive second dose of Venofer today.   Orders placed this encounter:  No orders of the defined types were placed in this encounter.    Derek Jack, MD [place] [phone]   I, Thana Ates, am acting as a scribe for Dr. Derek Jack.  I, Derek Jack MD, have reviewed the above documentation for accuracy and completeness, and I agree with the above.

## 2021-11-10 ENCOUNTER — Other Ambulatory Visit (HOSPITAL_COMMUNITY): Payer: Self-pay

## 2021-11-10 ENCOUNTER — Telehealth (HOSPITAL_COMMUNITY): Payer: Self-pay | Admitting: Dietician

## 2021-11-10 ENCOUNTER — Encounter (HOSPITAL_COMMUNITY): Payer: 59 | Admitting: Dietician

## 2021-11-10 NOTE — Telephone Encounter (Signed)
Nutrition Follow-up:  Patient with stage IV esophageal cancer. He is receiving FOLFOX + Nivolumab q14d (started 7/5). S/p open G-tube on 6/21  Spoke with patient via telephone. He reports "feeling down" today following treatment yesterday. Patient endorses nausea. He is taking compazine. This works well for [employer] an hour before returning. He denies vomiting. Patient reports 3 loose bowel movements daily. Patient is giving 4 cartons of [employer] 1.4 (1820 kcal, 80 grams protein, 936 ml free water). States he is drinking by mouth as well as via tube. He ate mashed potatoes yesterday. Patient endorses 64 ounces of water (orally + tube).     Medications: reviewed  Labs: 7/19 - K 3.3, glucose 100  Anthropometrics: Weight 121 lb 12.8 oz on 7/19 decreased   7/5 - 124 lb 6/19 - 118 lb 3.2 oz   Estimated Energy Needs  Kcals: 2020-2308 Protein: 98-110 Fluid: 2.3 L  NUTRITION DIAGNOSIS: Severe malnutrition continues    INTERVENTION:  Encouraged oral intake as tolerated  Pt will increase [employer] to 5 cartons/day Pt will pick up additional complimentary cases of [employer] on 7/21 Take nausea medication as prescribed   Anda [employer] 1.4 - 5 cartons daily provides 2275 kcal, 100 grams protein, 1170 ml free water from formula    MONITORING, EVALUATION, GOAL: weight trends, intake, tube feeding   NEXT VISIT: Monday July 24 (pt to arrive after getting off work)

## 2021-11-11 ENCOUNTER — Inpatient Hospital Stay (HOSPITAL_COMMUNITY): Payer: 59

## 2021-11-11 VITALS — BP 153/96 | HR 106 | Temp 97.8°F | Resp 18

## 2021-11-11 DIAGNOSIS — Z800 Family history of malignant neoplasm of digestive organs: Secondary | ICD-10-CM | POA: Diagnosis not present

## 2021-11-11 DIAGNOSIS — I10 Essential (primary) hypertension: Secondary | ICD-10-CM | POA: Diagnosis not present

## 2021-11-11 DIAGNOSIS — C155 Malignant neoplasm of lower third of esophagus: Secondary | ICD-10-CM | POA: Diagnosis not present

## 2021-11-11 DIAGNOSIS — D509 Iron deficiency anemia, unspecified: Secondary | ICD-10-CM | POA: Diagnosis not present

## 2021-11-11 DIAGNOSIS — R634 Abnormal weight loss: Secondary | ICD-10-CM | POA: Diagnosis not present

## 2021-11-11 DIAGNOSIS — R69 Illness, unspecified: Secondary | ICD-10-CM | POA: Diagnosis not present

## 2021-11-11 DIAGNOSIS — Z5112 Encounter for antineoplastic immunotherapy: Secondary | ICD-10-CM | POA: Diagnosis not present

## 2021-11-11 DIAGNOSIS — Z8042 Family history of malignant neoplasm of prostate: Secondary | ICD-10-CM | POA: Diagnosis not present

## 2021-11-11 DIAGNOSIS — E876 Hypokalemia: Secondary | ICD-10-CM | POA: Diagnosis not present

## 2021-11-11 DIAGNOSIS — E119 Type 2 diabetes mellitus without complications: Secondary | ICD-10-CM | POA: Diagnosis not present

## 2021-11-11 DIAGNOSIS — R911 Solitary pulmonary nodule: Secondary | ICD-10-CM | POA: Diagnosis not present

## 2021-11-11 DIAGNOSIS — Z79899 Other long term (current) drug therapy: Secondary | ICD-10-CM | POA: Diagnosis not present

## 2021-11-11 DIAGNOSIS — C159 Malignant neoplasm of esophagus, unspecified: Secondary | ICD-10-CM

## 2021-11-11 DIAGNOSIS — Z5111 Encounter for antineoplastic chemotherapy: Secondary | ICD-10-CM | POA: Diagnosis not present

## 2021-11-11 MED ORDER — SODIUM CHLORIDE 0.9% FLUSH
10.0000 mL | INTRAVENOUS | Status: DC | PRN
  Administered 2021-11-11: 10 mL

## 2021-11-11 MED ORDER — HEPARIN SOD (PORK) LOCK FLUSH 100 UNIT/ML IV SOLN
500.0000 [IU] | Freq: Once | INTRAVENOUS | Status: AC | PRN
  Administered 2021-11-11: 500 [IU]

## 2021-11-11 NOTE — Progress Notes (Signed)
Patient presents today for 5FU pump stop and disconnection after 46 hour continous infusion.   5FU pump deaccessed.  Patients port flushed without difficulty.  Good blood return noted with no bruising or swelling noted at site.  Needle removed intact.  Band aid applied.  VSS with discharge and left in satisfactory condition via wheelchair with no s/s of distress noted.    

## 2021-11-11 NOTE — Patient Instructions (Signed)
[place]  Discharge Instructions: Thank you for choosing [facility] to provide your oncology and hematology care.  If you have a lab appointment with the [place], please come in thru the Main Entrance and check in at the main information desk.  Wear comfortable clothing and clothing appropriate for easy access to any Portacath or PICC line.   We strive to give you quality time with your provider. You may need to reschedule your appointment if you arrive late (15 or more minutes).  Arriving late affects you and other patients whose appointments are after yours.  Also, if you miss three or more appointments without notifying the office, you may be dismissed from the clinic at the provider's discretion.      For prescription refill requests, have your pharmacy contact our office and allow 72 hours for refills to be completed.    Today you received the following chemotherapy and/or immunotherapy agents Pump dc      To help prevent nausea and vomiting after your treatment, we encourage you to take your nausea medication as directed.  BELOW ARE SYMPTOMS THAT SHOULD BE REPORTED IMMEDIATELY: *FEVER GREATER THAN 100.4 F (38 C) OR HIGHER *CHILLS OR SWEATING *NAUSEA AND VOMITING THAT IS NOT CONTROLLED WITH YOUR NAUSEA MEDICATION *UNUSUAL SHORTNESS OF BREATH *UNUSUAL BRUISING OR BLEEDING *URINARY PROBLEMS (pain or burning when urinating, or frequent urination) *BOWEL PROBLEMS (unusual diarrhea, constipation, pain near the anus) TENDERNESS IN MOUTH AND THROAT WITH OR WITHOUT PRESENCE OF ULCERS (sore throat, sores in mouth, or a toothache) UNUSUAL RASH, SWELLING OR PAIN  UNUSUAL VAGINAL DISCHARGE OR ITCHING   Items with * indicate a potential emergency and should be followed up as soon as possible or go to the Emergency Department if any problems should occur.  Please show the CHEMOTHERAPY ALERT CARD or IMMUNOTHERAPY ALERT CARD at check-in to the Emergency  Department and triage nurse.  Should you have questions after your visit or need to cancel or reschedule your appointment, please contact [facility] [phone]  and follow the prompts.  Office hours are 8:00 a.m. to 4:30 p.m. Monday - Friday. Please note that voicemails left after 4:00 p.m. may not be returned until the following business day.  We are closed weekends and major holidays. You have access to a nurse at all times for urgent questions. Please call the main number to the clinic [phone] and follow the prompts.  For any non-urgent questions, you may also contact your provider using MyChart. We now offer e-Visits for anyone 53 and older to request care online for non-urgent symptoms. For details visit [email or web address].   Also download the MyChart app! Go to the app store, search "MyChart", open the app, select [facility], and log in with your MyChart username and password.  Masks are optional in the cancer centers. If you would like for your care team to wear a mask while they are taking care of you, please let them know. For doctor visits, patients may have with them one support person who is at least 53 years old. At this time, visitors are not allowed in the infusion area.

## 2021-11-14 ENCOUNTER — Other Ambulatory Visit: Payer: Self-pay

## 2021-11-14 ENCOUNTER — Inpatient Hospital Stay (HOSPITAL_COMMUNITY): Payer: 59 | Admitting: Dietician

## 2021-11-14 ENCOUNTER — Telehealth (HOSPITAL_COMMUNITY): Payer: Self-pay | Admitting: Dietician

## 2021-11-14 NOTE — Telephone Encounter (Signed)
Nutrition Follow-up:  Patient with stage IV esophageal cancer. He is receiving FOLFOX + Nivolumab q14d (started 7/5). S/p open G-tube on 6/21  Spoke with patient via telephone. He reports leaving work early today due to nausea. He is taking compazine which works well for him, however he left medications at home. Patient reports eating more orally. He is tolerating soft moist textures, eating a variety of foods with good sources of protein (chicken livers, curry chicken, ramen with vienna sausage, biscuit dipped in hot chocolate, oatmeal). Yesterday he ate whole wheat pasta with meat sauce (spaghetti soup), ferrero with added peanut butter, cauliflower rice and pinto beans. He is currently drinking McNab by mouth and via tube. Patient feels he has significantly  increased oral intake and did not need to increase to 5 cartons [facility] as recommended. Patient denies vomiting, diarrhea, constipation. He is having 2 bowel movements daily.     Medications: reviewed   Labs: 7/19 K 3.3, glucose 100  Anthropometrics: Last weight 121 lb 12.8 oz on 7/19 decreased from 124 lb on 7/5 (pt states 7/5 wt was incorrect d/t cell phone, keys in pocket as well as wearing heavy boots)  Estimated Energy Needs   Kcals: 2020-2308 Protein: 98-110 Fluid: 2.3 L   NUTRITION DIAGNOSIS: Severe malnutrition continues    INTERVENTION:  Encouraged oral intake of high calorie high protein foods as tolerated Reinforced importance of adequate energy intake to maintain weights/strength Pt agrees to increase [employer] 1.4 to goal if weights have declined at next office visit Take nausea medication as prescribed   Anda [employer] 1.4 - 5 cartons daily provides 2275 kcal, 100 grams protein, 1170 ml free water from formula    MONITORING, EVALUATION, GOAL: weight trends, intake, tube feeding   NEXT VISIT: Monday July 31

## 2021-11-15 ENCOUNTER — Encounter (HOSPITAL_COMMUNITY): Payer: Self-pay | Admitting: Hematology

## 2021-11-17 ENCOUNTER — Ambulatory Visit: Payer: 59 | Admitting: Family Medicine

## 2021-11-19 ENCOUNTER — Emergency Department (HOSPITAL_COMMUNITY)
Admission: EM | Admit: 2021-11-19 | Discharge: 2021-11-19 | Disposition: A | Discharge status: Home / Self Care | Payer: 59 | Attending: Emergency Medicine | Admitting: Emergency Medicine

## 2021-11-19 ENCOUNTER — Encounter (HOSPITAL_COMMUNITY): Payer: Self-pay | Admitting: *Deleted

## 2021-11-19 ENCOUNTER — Other Ambulatory Visit: Payer: Self-pay

## 2021-11-19 DIAGNOSIS — R1013 Epigastric pain: Secondary | ICD-10-CM | POA: Diagnosis not present

## 2021-11-19 DIAGNOSIS — Z8501 Personal history of malignant neoplasm of esophagus: Secondary | ICD-10-CM | POA: Diagnosis not present

## 2021-11-19 DIAGNOSIS — R1010 Upper abdominal pain, unspecified: Secondary | ICD-10-CM | POA: Insufficient documentation

## 2021-11-19 NOTE — ED Provider Notes (Signed)
[facility] EMERGENCY DEPARTMENT Provider Note   CSN: [id number] Arrival date & time: 11/19/21  1454     History {Add pertinent medical, surgical, social history, OB history to HPI:1} Chief Complaint  Patient presents with   Abdominal Pain    Trevor [name] is a 51 y.o. male.  Pt states he has not had any nutrition today and wants a couple boost to put in his G-tube.  He has esophageal cancer.   Abdominal Pain      Home Medications Prior to Admission medications   Medication Sig Start Date End Date Taking? Authorizing Provider  allopurinol (ZYLOPRIM) 300 MG tablet Take 1 tablet (300 mg total) by mouth daily. 08/04/21   Sanjuana Kava, MD  amLODipine (NORVASC) 5 MG tablet Take 1 tablet (5 mg total) by mouth daily. 06/01/21   Orson Eva, MD  fluorouracil CALGB 55732 2,400 mg/m2 in sodium chloride 0.9 % 150 mL Inject 2,400 mg/m2 into the vein over 48 hr. Every 2 weeks    [provider]  FLUOROURACIL IV Inject into the vein every 14 (fourteen) days.    [provider]  ibuprofen (ADVIL) 600 MG tablet Take 1 tablet (600 mg total) by mouth every 8 (eight) hours as needed. 08/18/21   [name], [name]  LEUCOVORIN CALCIUM IV Inject into the vein every 14 (fourteen) days.    [provider]  loperamide (IMODIUM) 2 MG capsule Take 1 capsule (2 mg total) by mouth as needed for diarrhea or loose stools (take as directed according to the instruction sheet you were given). 10/04/21   Derek Jack, MD  magnesium oxide (MAG-OX) 400 (240 Mg) MG tablet Take 1 tablet (400 mg total) by mouth 2 (two) times daily. 06/01/21   Orson Eva, MD  nicotine (NICODERM CQ - DOSED IN MG/24 HOURS) 21 mg/24hr patch Place 1 patch (21 mg total) onto the skin daily. 10/27/21   Derek Jack, MD  OXALIPLATIN IV Inject into the vein every 14 (fourteen) days.    [provider]  potassium chloride SA (KLOR-CON M) 20 MEQ tablet Take 2 tablets (40 mEq total) by mouth daily. 10/27/21    Derek Jack, MD  prochlorperazine (COMPAZINE) 10 MG tablet Take 1 tablet (10 mg total) by mouth every 6 (six) hours as needed for nausea or vomiting. 10/27/21   Derek Jack, MD  Vitamin D, Ergocalciferol, (DRISDOL) 1.25 MG (50000 UNIT) CAPS capsule Take 1 capsule (50,000 Units total) by mouth every 7 (seven) days. Patient taking differently: Take 50,000 Units by mouth every Monday. 06/07/21   Orson Eva, MD      Allergies    Patient has no known allergies.    Review of Systems   Review of Systems  Gastrointestinal:  Positive for abdominal pain.    Physical Exam Updated Vital Signs BP (!) 152/103   Pulse (!) 119   Temp 98 F (36.7 C) (Oral)   Resp 18   Ht '5\' 6"'$  (1.676 m)   Wt 55.2 kg   SpO2 100%   BMI 19.66 kg/m  Physical Exam  ED Results / Procedures / Treatments   Labs (all labs ordered are listed, but only abnormal results are displayed) Labs Reviewed - No data to display  EKG None  Radiology No results found.  Procedures Procedures  {Document cardiac monitor, telemetry assessment procedure when appropriate:1}  Medications Ordered in ED Medications - No data to display  ED Course/ Medical Decision Making/ A&P  Medical Decision Making  Patient is given a couple boosts and will follow-up with his doctor  {Document critical care time when appropriate:1} {Document review of labs and clinical decision tools ie heart score, Chads2Vasc2 etc:1}  {Document your independent review of radiology images, and any outside records:1} {Document your discussion with family members, caretakers, and with consultants:1} {Document social determinants of health affecting pt's care:1} {Document your decision making why or why not admission, treatments were needed:1} Final Clinical Impression(s) / ED Diagnoses Final diagnoses:  Pain of upper abdomen    Rx / DC Orders ED Discharge Orders     None

## 2021-11-19 NOTE — Discharge Instructions (Signed)
Follow up with your md if any problems °

## 2021-11-19 NOTE — ED Notes (Signed)
Pt provided toomey syringe and boost drinks per his request

## 2021-11-19 NOTE — ED Triage Notes (Signed)
Pt c/o nausea and pain around G-tube site and generalized weakness that started suddenly while trying to eat just now. Pt has esophageal cancer.

## 2021-11-19 NOTE — ED Notes (Signed)
Pt d/c home per MD order, Discharge summary reviewed, pt verbalizes understanding. Ambulatory off unit. No s/s of acute distress noted at discharge. 

## 2021-11-21 ENCOUNTER — Inpatient Hospital Stay (HOSPITAL_COMMUNITY): Payer: 59 | Admitting: Dietician

## 2021-11-21 ENCOUNTER — Encounter (HOSPITAL_COMMUNITY): Payer: Self-pay | Admitting: *Deleted

## 2021-11-21 ENCOUNTER — Encounter (HOSPITAL_COMMUNITY): Payer: Self-pay | Admitting: Hematology

## 2021-11-21 MED ORDER — KATE FARMS STANDARD 1.4 EN LIQD: ENTERAL | 10 refills | Status: DC

## 2021-11-21 NOTE — Progress Notes (Signed)
Nutrition Follow-up:  Patient with stage IV esophageal cancer. He is receiving FOLFOX + Nivolumab q14d (started 7/5). S/p open G-tube on 6/21  Met with patient in clinic. He reports "almost falling out" at work the other day. Says his boss has given him a few days off to rest. Patient reports continuing to tolerate liquids orally. (Hot chocolate, chicken broth, water, soups, Pedialyte) Patient is he drinking [employer] orally and putting through tube. This taste bad. He adds syrup to improve flavor. Patient reports drinking 2 (325 ml) cartons by mouth. Says he usually vomits after this. He waits an hour before giving one via tube. Patient reports his house keeper threw away copies of written tube feeding instructions that were provided to him. Patient reports drinking Boost by mouth. He has diarrhea after consuming. Patient asking for medical power of attorney paperwork. This was provided to him.    Medications: reviewed   Labs: no new labs for review  Anthropometrics: Patient wt 111.5 lb today in clinic  7/19 - 121 lb 12.8 oz  10 lbs (8%) in 12 dats; severe  Estimated Energy Needs  Kcals: 2020-2308 Protein: 98-110 Fluid: 2.3 L  NUTRITION DIAGNOSIS: Severe malnutrition continues    INTERVENTION:  Reinforced education for tube feedings. Discussed feeding schedule and importance of increasing to goal of 5 cartons to minimize further weight loss. He agrees to schedule outlined below. Written instructions provided along with 4 copies. Recommend pt put formula in tube vs drinking. Encouraged oral intake of pleasure foods as tolerated Instructed pt to discontinue Boost - he does not tolerate this Provided one complimentary case of [employer] 1.4  [employer] 1.4 - Give one (325 ml) carton 5x/day (7AM, 10AM, 1PM, 4PM, 7PM). Flush tube with 60 ml water before and after each feeding. Drink by mouth additional 2 cups water/day. This provides 2275 kcal, 100 grams protein, 1170 ml free water (2250  ml total water with flushes + oral intake).      MONITORING, EVALUATION, GOAL: weight trends, intake, tube feeding   NEXT VISIT: Monday August 7

## 2021-11-22 ENCOUNTER — Other Ambulatory Visit: Payer: Self-pay

## 2021-11-23 ENCOUNTER — Inpatient Hospital Stay: Payer: 59 | Attending: Hematology

## 2021-11-23 ENCOUNTER — Inpatient Hospital Stay: Payer: 59

## 2021-11-23 ENCOUNTER — Inpatient Hospital Stay (HOSPITAL_COMMUNITY): Payer: 59

## 2021-11-23 ENCOUNTER — Inpatient Hospital Stay (HOSPITAL_BASED_OUTPATIENT_CLINIC_OR_DEPARTMENT_OTHER): Payer: 59 | Admitting: Hematology

## 2021-11-23 ENCOUNTER — Inpatient Hospital Stay (HOSPITAL_COMMUNITY): Payer: 59 | Admitting: Hematology

## 2021-11-23 VITALS — BP 167/90 | HR 56 | Temp 97.6°F | Resp 18

## 2021-11-23 VITALS — BP 175/99

## 2021-11-23 DIAGNOSIS — R69 Illness, unspecified: Secondary | ICD-10-CM | POA: Diagnosis not present

## 2021-11-23 DIAGNOSIS — C155 Malignant neoplasm of lower third of esophagus: Secondary | ICD-10-CM | POA: Insufficient documentation

## 2021-11-23 DIAGNOSIS — Z800 Family history of malignant neoplasm of digestive organs: Secondary | ICD-10-CM | POA: Insufficient documentation

## 2021-11-23 DIAGNOSIS — Z5112 Encounter for antineoplastic immunotherapy: Secondary | ICD-10-CM | POA: Diagnosis not present

## 2021-11-23 DIAGNOSIS — E119 Type 2 diabetes mellitus without complications: Secondary | ICD-10-CM | POA: Diagnosis not present

## 2021-11-23 DIAGNOSIS — E876 Hypokalemia: Secondary | ICD-10-CM | POA: Diagnosis not present

## 2021-11-23 DIAGNOSIS — C159 Malignant neoplasm of esophagus, unspecified: Secondary | ICD-10-CM

## 2021-11-23 DIAGNOSIS — Z8042 Family history of malignant neoplasm of prostate: Secondary | ICD-10-CM | POA: Insufficient documentation

## 2021-11-23 DIAGNOSIS — R911 Solitary pulmonary nodule: Secondary | ICD-10-CM | POA: Diagnosis not present

## 2021-11-23 DIAGNOSIS — D509 Iron deficiency anemia, unspecified: Secondary | ICD-10-CM | POA: Insufficient documentation

## 2021-11-23 DIAGNOSIS — Z5111 Encounter for antineoplastic chemotherapy: Secondary | ICD-10-CM | POA: Insufficient documentation

## 2021-11-23 DIAGNOSIS — Z79899 Other long term (current) drug therapy: Secondary | ICD-10-CM | POA: Diagnosis not present

## 2021-11-23 DIAGNOSIS — F17210 Nicotine dependence, cigarettes, uncomplicated: Secondary | ICD-10-CM | POA: Diagnosis not present

## 2021-11-23 DIAGNOSIS — I10 Essential (primary) hypertension: Secondary | ICD-10-CM | POA: Diagnosis not present

## 2021-11-23 DIAGNOSIS — D500 Iron deficiency anemia secondary to blood loss (chronic): Secondary | ICD-10-CM

## 2021-11-23 DIAGNOSIS — C49A1 Gastrointestinal stromal tumor of esophagus: Secondary | ICD-10-CM | POA: Diagnosis not present

## 2021-11-23 LAB — CBC WITH DIFFERENTIAL/PLATELET
Abs Immature Granulocytes: 0.01 10*3/uL (ref 0.00–0.07)
Basophils Absolute: 0.1 10*3/uL (ref 0.0–0.1)
Basophils Relative: 1 %
Eosinophils Absolute: 0 10*3/uL (ref 0.0–0.5)
Eosinophils Relative: 0 %
HCT: 28.7 % — ABNORMAL LOW (ref 39.0–52.0)
Hemoglobin: 9.2 g/dL — ABNORMAL LOW (ref 13.0–17.0)
Immature Granulocytes: 0 %
Lymphocytes Relative: 30 %
Lymphs Abs: 2 10*3/uL (ref 0.7–4.0)
MCH: 24.1 pg — ABNORMAL LOW (ref 26.0–34.0)
MCHC: 32.1 g/dL (ref 30.0–36.0)
MCV: 75.3 fL — ABNORMAL LOW (ref 80.0–100.0)
Monocytes Absolute: 0.7 10*3/uL (ref 0.1–1.0)
Monocytes Relative: 10 %
Neutro Abs: 4 10*3/uL (ref 1.7–7.7)
Neutrophils Relative %: 59 %
Platelets: 232 10*3/uL (ref 150–400)
RBC: 3.81 MIL/uL — ABNORMAL LOW (ref 4.22–5.81)
RDW: 20.1 % — ABNORMAL HIGH (ref 11.5–15.5)
WBC: 6.8 10*3/uL (ref 4.0–10.5)
nRBC: 0 % (ref 0.0–0.2)

## 2021-11-23 LAB — COMPREHENSIVE METABOLIC PANEL
ALT: 14 U/L (ref 0–44)
AST: 25 U/L (ref 15–41)
Albumin: 2.9 g/dL — ABNORMAL LOW (ref 3.5–5.0)
Alkaline Phosphatase: 70 U/L (ref 38–126)
Anion gap: 7 (ref 5–15)
BUN: 8 mg/dL (ref 6–20)
CO2: 23 mmol/L (ref 22–32)
Calcium: 8.4 mg/dL — ABNORMAL LOW (ref 8.9–10.3)
Chloride: 106 mmol/L (ref 98–111)
Creatinine, Ser: 0.74 mg/dL (ref 0.61–1.24)
GFR, Estimated: >60 mL/min (ref >60)
Glucose, Bld: 105 mg/dL — ABNORMAL HIGH (ref 70–99)
Potassium: 3.5 mmol/L (ref 3.5–5.1)
Sodium: 136 mmol/L (ref 135–145)
Total Bilirubin: 0.8 mg/dL (ref 0.3–1.2)
Total Protein: 6.7 g/dL (ref 6.5–8.1)

## 2021-11-23 LAB — MAGNESIUM: Magnesium: 2 mg/dL (ref 1.7–2.4)

## 2021-11-23 LAB — TSH: TSH: 0.796 u[IU]/mL (ref 0.350–4.500)

## 2021-11-23 MED ORDER — DEXTROSE 5 % IV SOLN: Freq: Once | INTRAVENOUS | Status: AC

## 2021-11-23 MED ORDER — SODIUM CHLORIDE 0.9 % IV SOLN: Freq: Once | INTRAVENOUS | Status: AC

## 2021-11-23 MED ORDER — AMLODIPINE BESYLATE 5 MG PO TABS
5.0000 mg | ORAL_TABLET | Freq: Once | ORAL | Status: AC
  Administered 2021-11-23: 5 mg via ORAL
  Filled 2021-11-23: qty 1

## 2021-11-23 MED ORDER — LEUCOVORIN CALCIUM INJECTION 350 MG
400.0000 mg/m2 | Freq: Once | INTRAVENOUS | Status: AC
  Administered 2021-11-23: 632 mg via INTRAVENOUS
  Filled 2021-11-23: qty 31.6

## 2021-11-23 MED ORDER — SODIUM CHLORIDE 0.9 % IV SOLN
300.0000 mg | Freq: Once | INTRAVENOUS | Status: AC
  Administered 2021-11-23: 300 mg via INTRAVENOUS
  Filled 2021-11-23: qty 300

## 2021-11-23 MED ORDER — SODIUM CHLORIDE 0.9 % IV SOLN
10.0000 mg | Freq: Once | INTRAVENOUS | Status: AC
  Administered 2021-11-23: 10 mg via INTRAVENOUS
  Filled 2021-11-23: qty 10

## 2021-11-23 MED ORDER — OXALIPLATIN CHEMO INJECTION 100 MG/20ML
85.0000 mg/m2 | Freq: Once | INTRAVENOUS | Status: AC
  Administered 2021-11-23: 135 mg via INTRAVENOUS
  Filled 2021-11-23: qty 10

## 2021-11-23 MED ORDER — SODIUM CHLORIDE 0.9 % IV SOLN
2400.0000 mg/m2 | INTRAVENOUS | Status: DC
  Administered 2021-11-23: 3800 mg via INTRAVENOUS
  Filled 2021-11-23: qty 76

## 2021-11-23 MED ORDER — SODIUM CHLORIDE 0.9 % IV SOLN
240.0000 mg | Freq: Once | INTRAVENOUS | Status: AC
  Administered 2021-11-23: 240 mg via INTRAVENOUS
  Filled 2021-11-23: qty 24

## 2021-11-23 MED ORDER — FLUOROURACIL CHEMO INJECTION 2.5 GM/50ML
400.0000 mg/m2 | Freq: Once | INTRAVENOUS | Status: AC
  Administered 2021-11-23: 650 mg via INTRAVENOUS
  Filled 2021-11-23: qty 13

## 2021-11-23 MED ORDER — SODIUM CHLORIDE 0.9 % IV SOLN: INTRAVENOUS | Status: DC

## 2021-11-23 MED ORDER — PALONOSETRON HCL INJECTION 0.25 MG/5ML
0.2500 mg | Freq: Once | INTRAVENOUS | Status: AC
  Administered 2021-11-23: 0.25 mg via INTRAVENOUS
  Filled 2021-11-23: qty 5

## 2021-11-23 NOTE — Progress Notes (Signed)
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  PRIOR THERAPY: none  NGS Results: not done  CURRENT THERAPY: FOLFOX + Nivolumab q14d  BRIEF ONCOLOGIC HISTORY:  Oncology History  Squamous cell esophageal cancer ([place])  09/13/2021 Initial Diagnosis   Squamous cell esophageal cancer ([place])   10/26/2021 -  Chemotherapy   Patient is on Treatment Plan : GASTROESOPHAGEAL FOLFOX + Nivolumab q14d       CANCER STAGING:  Cancer Staging  Squamous cell esophageal cancer ([place]) Staging form: Esophagus - Squamous Cell Carcinoma, AJCC 8th Edition - Clinical stage from 09/13/2021: Stage IVB (cT3, cN1, cM1, G3, L: Lower) - Unsigned - Pathologic: No stage assigned - Unsigned   INTERVAL HISTORY:  Mr. Trevor [name], a 51 y.o. male, returns for routine follow-up and consideration for next cycle of chemotherapy. Manasseh was last seen on 11/09/2021.  Due for cycle #3 of FOLFOX + Nivolumab today.   Overall, he tells me he has been feeling pretty well. He is unable to swallow solids or liquids. He reports numbness/tingling in his mouth starting yesterday. He reports 2-3 BM daily. He denies skin rash.   Overall, he feels ready for next cycle of chemo today.    REVIEW OF SYSTEMS:  Review of Systems  Constitutional:  Negative for appetite change and fatigue.  HENT:   Positive for trouble swallowing.   Gastrointestinal:  Positive for diarrhea and nausea.  Skin:  Negative for rash.  Neurological:  Positive for numbness (R hand).  All other systems reviewed and are negative.   PAST MEDICAL/SURGICAL HISTORY:  Past Medical History:  Diagnosis Date   Diabetes mellitus without complication ([place])    Esophageal cancer ([place])    Hypertension    Past Surgical History:   Procedure Laterality Date   BIOPSY  08/26/2021   Procedure: BIOPSY;  Surgeon: Eloise Harman, DO;  Location: [facility] ENDO SU[facility];  Service: Endoscopy;;   ESOPHAGOGASTRODUODENOSCOPY (EGD) WITH PROPOFOL N/A 08/26/2021   Procedure: ESOPHAGOGASTRODUODENOSCOPY (EGD) WITH PROPOFOL;  Surgeon: Eloise Harman, DO;  Location: [facility] ENDO SUITE;  Service: Endoscopy;  Laterality: N/A;  2:00pm   GASTROSTOMY N/A 10/12/2021   Procedure: OPEN GASTROSTOMY TUBE;  Surgeon: Aviva Signs, MD;  Location: [facility] ORS;  Service: General;  Laterality: N/A;   PORTACATH PLACEMENT Left 10/12/2021   Procedure: INSERTION PORT-A-CATH;  Surgeon: Aviva Signs, MD;  Location: [facility] ORS;  Service: General;  Laterality: Left;    SOCIAL HISTORY:  Social History   Socioeconomic History   Marital status: Divorced    Spouse name: Not on file   Number of children: Not on file   Years of education: Not on file   Highest education level: Not on file  Occupational History   Not on file  Tobacco Use   Smoking status: Some Days    Packs/day: 0.50    Types: Cigarettes   Smokeless tobacco: Never  Vaping Use   Vaping Use: Never used  Substance and Sexual Activity   Alcohol use: Not Currently    Alcohol/week: 14.0 standard drinks of alcohol    Types: 14 Cans of beer per week    Comment: 2- 3 16 oz beer daily.   Drug use: Never   Sexual activity: Not on file  Other Topics Concern  Not on file  Social History Narrative   Not on file   Social Determinants of Health   Financial Resource Strain: High Risk (09/28/2021)   Overall Financial Resource Strain (CARDIA)    Difficulty of Paying Living Expenses: Hard  Food Insecurity: Not on file  Transportation Needs: Not on file  Physical Activity: Not on file  Stress: Not on file  Social Connections: Not on file  Intimate Partner Violence: Not on file    FAMILY HISTORY:  Family History  Problem Relation Age of Onset   Cancer Father        prostate   Cancer Sister    Colon cancer Neg  Hx    Esophageal cancer Neg Hx     CURRENT MEDICATIONS:  Current Outpatient Medications  Medication Sig Dispense Refill   allopurinol (ZYLOPRIM) 300 MG tablet Take 1 tablet (300 mg total) by mouth daily. 30 tablet 5   amLODipine (NORVASC) 5 MG tablet Take 1 tablet (5 mg total) by mouth daily. 30 tablet 1   fluorouracil CALGB 45409 2,400 mg/m2 in sodium chloride 0.9 % 150 mL Inject 2,400 mg/m2 into the vein over 48 hr. Every 2 weeks     FLUOROURACIL IV Inject into the vein every 14 (fourteen) days.     ibuprofen (ADVIL) 600 MG tablet Take 1 tablet (600 mg total) by mouth every 8 (eight) hours as needed. 90 tablet 1   LEUCOVORIN CALCIUM IV Inject into the vein every 14 (fourteen) days.     loperamide (IMODIUM) 2 MG capsule Take 1 capsule (2 mg total) by mouth as needed for diarrhea or loose stools (take as directed according to the instruction sheet you were given). 30 capsule 4   magnesium oxide (MAG-OX) 400 (240 Mg) MG tablet Take 1 tablet (400 mg total) by mouth 2 (two) times daily. 60 tablet 1   nicotine (NICODERM CQ - DOSED IN MG/24 HOURS) 21 mg/24hr patch Place 1 patch (21 mg total) onto the skin daily. 28 patch 1   Nutritional Supplements ([employer] STANDARD 1.4) LIQD Give one carton (325 ml) [employer] 1.4 via tube 5x/day. Flush tube with 60 ml water before and after each feeding. Drink by mouth or give via tube additional 2 cups (480 ml) water daily. This provides 2275 kcal, 100 grams protein, 1170 ml free water from formula (2250 ml total water with flushes). Meets 100% needs 1625 mL 10   OXALIPLATIN IV Inject into the vein every 14 (fourteen) days.     potassium chloride SA (KLOR-CON M) 20 MEQ tablet Take 2 tablets (40 mEq total) by mouth daily. 60 tablet 3   prochlorperazine (COMPAZINE) 10 MG tablet Take 1 tablet (10 mg total) by mouth every 6 (six) hours as needed for nausea or vomiting. 30 tablet 6   Vitamin D, Ergocalciferol, (DRISDOL) 1.25 MG (50000 UNIT) CAPS capsule Take 1  capsule (50,000 Units total) by mouth every 7 (seven) days. (Patient taking differently: Take 50,000 Units by mouth every Monday.) 5 capsule 1   No current facility-administered medications for this visit.    ALLERGIES:  No Known Allergies  PHYSICAL EXAM:  Performance status (ECOG): 0 - Asymptomatic  There were no vitals filed for this visit. Wt Readings from Last 3 Encounters:  11/23/21 116 lb (52.6 kg)  11/19/21 121 lb 12.8 oz (55.2 kg)  11/09/21 121 lb 12.8 oz (55.2 kg)   Physical Exam Vitals reviewed.  Constitutional:      Appearance: Normal appearance.  Cardiovascular:  Rate and Rhythm: Normal rate and regular rhythm.     Pulses: Normal pulses.     Heart sounds: Normal heart sounds.  Pulmonary:     Effort: Pulmonary effort is normal.     Breath sounds: Normal breath sounds.  Neurological:     General: No focal deficit present.     Mental Status: He is alert and oriented to person, place, and time.  Psychiatric:        Mood and Affect: Mood normal.        Behavior: Behavior normal.     LABORATORY DATA:  I have reviewed the labs as listed.     Latest Ref Rng & Units 11/09/2021    7:58 AM 10/26/2021    8:09 AM 10/12/2021    4:52 AM  CBC  WBC 4.0 - 10.5 K/uL 6.7  10.2  13.5   Hemoglobin 13.0 - 17.0 g/dL 8.8  9.0  10.0   Hematocrit 39.0 - 52.0 % 27.3  27.3  30.6   Platelets 150 - 400 K/uL 383  386  274       Latest Ref Rng & Units 11/09/2021    7:58 AM 10/26/2021    8:09 AM 10/12/2021    4:52 AM  CMP  Glucose 70 - 99 mg/dL 100  88  99   BUN 6 - 20 mg/dL _0 Creatinine 0.61 - 1.24 mg/dL 0.64  0.53  0.50   Sodium 135 - 145 mmol/L 140  138  132   Potassium 3.5 - 5.1 mmol/L 3.3  2.6  3.7   Chloride 98 - 111 mmol/L 110  104  104   CO2 22 - 32 mmol/L _1 Calcium 8.9 - 10.3 mg/dL 8.5  9.2  8.9   Total Protein 6.5 - 8.1 g/dL 6.4  6.5    Total Bilirubin 0.3 - 1.2 mg/dL 0.5  0.6    Alkaline Phos 38 - 126 U/L 62  52    AST 15 - 41 U/L 11  10    ALT  0 - 44 U/L 8  7      DIAGNOSTIC IMAGING:  I have independently reviewed the scans and discussed with the patient. No results found.   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 - 34 pound weight loss in the last 2 months. - EGD (08/26/2021): A large fungating ulcerating mass found in the lower third of the esophagus, 30 cm from incisors.  Nearly obstructing and circumferential.  Scope could not be advanced. - Pathology: Invasive moderately to poorly differentiated squamous cell carcinoma. - CT CAP with contrast (09/08/2021): Circumferential irregular wall thickening along the length of approximately 9 cm in the mid and distal thoracic esophagus.  Haziness in the periesophageal fat suggesting transmural extension of the tumor.  Small adjacent 6 mm paraesophageal lymph node.  1.3 cm endobronchial versus peribronchial smooth nodule adjacent to the segmental bronchus within the lingula.  Differential includes intrapulmonary lymph node/endobronchial neoplasm. - PET scan (09/22/2021): Left lung nodule PET positive.  Left deltoid uptake SUV 6.9, left erector spine musculature adjacent to L3 transverse process SUV 8.4, left gluteal activity with SUV 2.97.  Positive for large esophageal mass involving half to one third of the distal esophagus with upper abdominal nodal metastasis. - Left deltoid biopsy (10/13/2021): Keratinizing squamous cell carcinoma - FOLFOX + Nivolumab started on 10/26/2021 - [facility] NGS:  PD-L1 CPS 15%, HER2 negative, MSI-stable    Social/family history: - He lives by himself.  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 - Current active smoker, half pack per day for 10 years.  He drinks beer daily. - Father had colon cancer and sister had stomach cancer.   PLAN:  Stage IV (T3N1 M1 G3) lower esophageal squamous cell carcinoma: - He has tolerated cycle 2 of FOLFOX and nivolumab reasonably well. - He did not experience any  immunotherapy related side effects. - He had nausea for few days which was easily controlled with Compazine. - His blood pressure is a little high today because he forgot to take Norvasc.  He will be given Norvasc 5 mg x 1 today. - Reviewed labs today which showed normal LFTs with albumin 2.9.  CBC shows normal white count and platelet count.  TSH was normal.  Proceed with cycle 3 today.  RTC 2 weeks for follow-up with repeat labs and tumor marker.  2.  Weight loss: - He could not tolerate D[employer] [place] as he reports that it is too thick and causes nausea.  He was supposed to taking 5 cans/day for a total calorie count of 2275/day. - He is using Ensure 350 cal through PEG tube. - He reports retrosternal pain when swallowing anything but chicken broth.  He lost 5 pounds since last 2 weeks. - We will reach out to our dietitian for further recommendations to maintain his calorie intake.  3.  Hypokalemia: - Potassium is normal today.  Continue potassium 40 mEq daily.  4.  Microcytic anemia: - Hemoglobin is 9.2 today.  He will receive third dose of Venofer today.   Orders placed this encounter:  No orders of the defined types were placed in this encounter.    Derek Jack, MD [place] [phone]   I, Thana Ates, am acting as a scribe for Dr. Derek Jack.  I, Derek Jack MD, have reviewed the above documentation for accuracy and completeness, and I agree with the above.

## 2021-11-23 NOTE — Patient Instructions (Signed)
[facility]  Discharge Instructions: Thank you for choosing [facility] to provide your oncology and hematology care.  If you have a lab appointment with the [facility], please come in thru the Main Entrance and check in at the main information desk.  Wear comfortable clothing and clothing appropriate for easy access to any Portacath or PICC line.   We strive to give you quality time with your provider. You may need to reschedule your appointment if you arrive late (15 or more minutes).  Arriving late affects you and other patients whose appointments are after yours.  Also, if you miss three or more appointments without notifying the office, you may be dismissed from the clinic at the provider's discretion.      For prescription refill requests, have your pharmacy contact our office and allow 72 hours for refills to be completed.    Today you received the following chemotherapy and/or immunotherapy agents Opdivo, Venofer, and Folfox.   To help prevent nausea and vomiting after your treatment, we encourage you to take your nausea medication as directed.  BELOW ARE SYMPTOMS THAT SHOULD BE REPORTED IMMEDIATELY: *FEVER GREATER THAN 100.4 F (38 C) OR HIGHER *CHILLS OR SWEATING *NAUSEA AND VOMITING THAT IS NOT CONTROLLED WITH YOUR NAUSEA MEDICATION *UNUSUAL SHORTNESS OF BREATH *UNUSUAL BRUISING OR BLEEDING *URINARY PROBLEMS (pain or burning when urinating, or frequent urination) *BOWEL PROBLEMS (unusual diarrhea, constipation, pain near the anus) TENDERNESS IN MOUTH AND THROAT WITH OR WITHOUT PRESENCE OF ULCERS (sore throat, sores in mouth, or a toothache) UNUSUAL RASH, SWELLING OR PAIN  UNUSUAL VAGINAL DISCHARGE OR ITCHING   Items with * indicate a potential emergency and should be followed up as soon as possible or go to the Emergency Department if any problems should occur.  Please show the CHEMOTHERAPY ALERT CARD or IMMUNOTHERAPY ALERT CARD at  check-in to the Emergency Department and triage nurse.  Should you have questions after your visit or need to cancel or reschedule your appointment, please contact [facility] [phone]  and follow the prompts.  Office hours are 8:00 a.m. to 4:30 p.m. Monday - Friday. Please note that voicemails left after 4:00 p.m. may not be returned until the following business day.  We are closed weekends and major holidays. You have access to a nurse at all times for urgent questions. Please call the main number to the clinic [phone] and follow the prompts.  For any non-urgent questions, you may also contact your provider using MyChart. We now offer e-Visits for anyone 93 and older to request care online for non-urgent symptoms. For details visit [email or web address].   Also download the MyChart app! Go to the app store, search "MyChart", open the app, select [facility], and log in with your MyChart username and password.  Masks are optional in the cancer centers. If you would like for your care team to wear a mask while they are taking care of you, please let them know. For doctor visits, patients may have with them one support person who is at least 53 years old. At this time, visitors are not allowed in the infusion area.

## 2021-11-23 NOTE — Progress Notes (Signed)
Pt presents today for Venofer, Opdivo, Folfox per provider's order. Labs and other vital signs WNL for treatment. Okay to proceed with treatment today per Dr.K.  Pt's BP was 175/99 today, Dr.K stated to give '5mg'$  Amlodipine x 1 dose while in the clinic today.  Venofer, Opdivo,  Folfox , and Amlodipine 5 mg x 1 dose given today per MD orders. Tolerated infusion without adverse affects. Vital signs stable. No complaints at this time. Discharged from clinic ambulatory in stable condition. Alert and oriented x 3. F/U with [facility] as scheduled.  5FU ambulatory pump infusing.

## 2021-11-23 NOTE — Patient Instructions (Addendum)
[facility] at [facility] Discharge Instructions   You were seen and examined today by Dr. Delton Coombes.  He reviewed the results of your lab work which are normal/stable.   We will proceed with your treatment today.   Try to use only the tube feeding until the inflammation in your esophagus dies down. Use 6.5 cans of the Ensure Plus daily.   Return as scheduled in 2 weeks.    Thank you for choosing [facility] at [facility] to provide your oncology and hematology care.  To afford each patient quality time with our provider, please arrive at least 15 minutes before your scheduled appointment time.   If you have a lab appointment with the [facility] please come in thru the Main Entrance and check in at the main information desk.  You need to re-schedule your appointment should you arrive 10 or more minutes late.  We strive to give you quality time with our providers, and arriving late affects you and other patients whose appointments are after yours.  Also, if you no show three or more times for appointments you may be dismissed from the clinic at the providers discretion.     Again, thank you for choosing [facility].  Our hope is that these requests will decrease the amount of time that you wait before being seen by our physicians.       _____________________________________________________________  Should you have questions after your visit to [facility], please contact our office at [phone] and follow the prompts.  Our office hours are 8:00 a.m. and 4:30 p.m. Monday - Friday.  Please note that voicemails left after 4:00 p.m. may not be returned until the following business day.  We are closed weekends and major holidays.  You do have access to a nurse 24-7, just call the main number to the clinic [phone] and do not press any options, hold on the line and a nurse will answer the phone.    For prescription  refill requests, have your pharmacy contact our office and allow 72 hours.    Due to Covid, you will need to wear a mask upon entering the hospital. If you do not have a mask, a mask will be given to you at the Main Entrance upon arrival. For doctor visits, patients may have 1 support person age 74 or older with them. For treatment visits, patients can not have anyone with them due to social distancing guidelines and our immunocompromised population.

## 2021-11-24 ENCOUNTER — Other Ambulatory Visit: Payer: Self-pay | Admitting: *Deleted

## 2021-11-24 ENCOUNTER — Encounter: Payer: Self-pay | Admitting: *Deleted

## 2021-11-24 ENCOUNTER — Other Ambulatory Visit: Payer: Self-pay

## 2021-11-24 MED ORDER — VITAMIN D (ERGOCALCIFEROL) 1.25 MG (50000 UNIT) PO CAPS: 50000.0000 [IU] | ORAL_CAPSULE | ORAL | 0 refills | Status: DC

## 2021-11-24 NOTE — Progress Notes (Signed)
Updated FMLA documents faxed to [employer] to include specifics not previously mentioned.

## 2021-11-25 ENCOUNTER — Inpatient Hospital Stay: Payer: 59

## 2021-11-25 ENCOUNTER — Inpatient Hospital Stay (HOSPITAL_COMMUNITY): Payer: 59

## 2021-11-25 VITALS — BP 135/94 | HR 118 | Temp 98.9°F | Resp 18

## 2021-11-25 DIAGNOSIS — E876 Hypokalemia: Secondary | ICD-10-CM | POA: Diagnosis not present

## 2021-11-25 DIAGNOSIS — I10 Essential (primary) hypertension: Secondary | ICD-10-CM | POA: Diagnosis not present

## 2021-11-25 DIAGNOSIS — D509 Iron deficiency anemia, unspecified: Secondary | ICD-10-CM | POA: Diagnosis not present

## 2021-11-25 DIAGNOSIS — C159 Malignant neoplasm of esophagus, unspecified: Secondary | ICD-10-CM

## 2021-11-25 DIAGNOSIS — Z79899 Other long term (current) drug therapy: Secondary | ICD-10-CM | POA: Diagnosis not present

## 2021-11-25 DIAGNOSIS — C155 Malignant neoplasm of lower third of esophagus: Secondary | ICD-10-CM | POA: Diagnosis not present

## 2021-11-25 DIAGNOSIS — Z8042 Family history of malignant neoplasm of prostate: Secondary | ICD-10-CM | POA: Diagnosis not present

## 2021-11-25 DIAGNOSIS — Z800 Family history of malignant neoplasm of digestive organs: Secondary | ICD-10-CM | POA: Diagnosis not present

## 2021-11-25 DIAGNOSIS — Z5111 Encounter for antineoplastic chemotherapy: Secondary | ICD-10-CM | POA: Diagnosis not present

## 2021-11-25 DIAGNOSIS — R911 Solitary pulmonary nodule: Secondary | ICD-10-CM | POA: Diagnosis not present

## 2021-11-25 DIAGNOSIS — R69 Illness, unspecified: Secondary | ICD-10-CM | POA: Diagnosis not present

## 2021-11-25 DIAGNOSIS — Z5112 Encounter for antineoplastic immunotherapy: Secondary | ICD-10-CM | POA: Diagnosis not present

## 2021-11-25 DIAGNOSIS — E119 Type 2 diabetes mellitus without complications: Secondary | ICD-10-CM | POA: Diagnosis not present

## 2021-11-25 MED ORDER — HEPARIN SOD (PORK) LOCK FLUSH 100 UNIT/ML IV SOLN
500.0000 [IU] | Freq: Once | INTRAVENOUS | Status: AC | PRN
  Administered 2021-11-25: 500 [IU]

## 2021-11-25 MED ORDER — SODIUM CHLORIDE 0.9% FLUSH
10.0000 mL | INTRAVENOUS | Status: DC | PRN
  Administered 2021-11-25: 10 mL

## 2021-11-25 NOTE — Progress Notes (Signed)
Patient presents for home infusion 5FU pump d/c.  Patients port flushed without difficulty.  Good blood return noted with no bruising or swelling noted at site.  Band aid applied.  VSS with discharge and left in satisfactory condition with no s/s of distress noted.

## 2021-11-25 NOTE — Patient Instructions (Signed)
[facility]  Discharge Instructions: Thank you for choosing [facility] to provide your oncology and hematology care.  If you have a lab appointment with the Cancer Center, please come in thru the Main Entrance and check in at the main information desk.  Wear comfortable clothing and clothing appropriate for easy access to any Portacath or PICC line.   We strive to give you quality time with your provider. You may need to reschedule your appointment if you arrive late (15 or more minutes).  Arriving late affects you and other patients whose appointments are after yours.  Also, if you miss three or more appointments without notifying the office, you may be dismissed from the clinic at the provider's discretion.      For prescription refill requests, have your pharmacy contact our office and allow 72 hours for refills to be completed.      To help prevent nausea and vomiting after your treatment, we encourage you to take your nausea medication as directed.  BELOW ARE SYMPTOMS THAT SHOULD BE REPORTED IMMEDIATELY: *FEVER GREATER THAN 100.4 F (38 C) OR HIGHER *CHILLS OR SWEATING *NAUSEA AND VOMITING THAT IS NOT CONTROLLED WITH YOUR NAUSEA MEDICATION *UNUSUAL SHORTNESS OF BREATH *UNUSUAL BRUISING OR BLEEDING *URINARY PROBLEMS (pain or burning when urinating, or frequent urination) *BOWEL PROBLEMS (unusual diarrhea, constipation, pain near the anus) TENDERNESS IN MOUTH AND THROAT WITH OR WITHOUT PRESENCE OF ULCERS (sore throat, sores in mouth, or a toothache) UNUSUAL RASH, SWELLING OR PAIN  UNUSUAL VAGINAL DISCHARGE OR ITCHING   Items with * indicate a potential emergency and should be followed up as soon as possible or go to the Emergency Department if any problems should occur.  Please show the CHEMOTHERAPY ALERT CARD or IMMUNOTHERAPY ALERT CARD at check-in to the Emergency Department and triage nurse.  Should you have questions after your visit or need to  cancel or reschedule your appointment, please contact [facility] [phone]  and follow the prompts.  Office hours are 8:00 a.m. to 4:30 p.m. Monday - Friday. Please note that voicemails left after 4:00 p.m. may not be returned until the following business day.  We are closed weekends and major holidays. You have access to a nurse at all times for urgent questions. Please call the main number to the clinic [phone] and follow the prompts.  For any non-urgent questions, you may also contact your provider using MyChart. We now offer e-Visits for anyone 18 and older to request care online for non-urgent symptoms. For details visit [email or web address].   Also download the MyChart app! Go to the app store, search "MyChart", open the app, select [facility], and log in with your MyChart username and password.  Masks are optional in the cancer centers. If you would like for your care team to wear a mask while they are taking care of you, please let them know. For doctor visits, patients may have with them one support person who is at least 53 years old. At this time, visitors are not allowed in the infusion area.  

## 2021-11-26 DIAGNOSIS — C49A1 Gastrointestinal stromal tumor of esophagus: Secondary | ICD-10-CM | POA: Diagnosis not present

## 2021-11-28 ENCOUNTER — Other Ambulatory Visit: Payer: Self-pay

## 2021-11-28 ENCOUNTER — Ambulatory Visit (INDEPENDENT_AMBULATORY_CARE_PROVIDER_SITE_OTHER): Payer: 59 | Admitting: Family Medicine

## 2021-11-28 ENCOUNTER — Encounter: Payer: Self-pay | Admitting: Family Medicine

## 2021-11-28 ENCOUNTER — Emergency Department (HOSPITAL_COMMUNITY)
Admission: EM | Admit: 2021-11-28 | Discharge: 2021-11-28 | Discharge status: Against Medical Advice | Payer: 59 | Source: Home / Self Care

## 2021-11-28 ENCOUNTER — Ambulatory Visit: Payer: 59 | Admitting: Dietician

## 2021-11-28 ENCOUNTER — Other Ambulatory Visit: Payer: Self-pay | Admitting: *Deleted

## 2021-11-28 VITALS — BP 121/83 | HR 114 | Ht 66.0 in | Wt 110.1 lb

## 2021-11-28 DIAGNOSIS — J209 Acute bronchitis, unspecified: Secondary | ICD-10-CM | POA: Diagnosis not present

## 2021-11-28 DIAGNOSIS — I10 Essential (primary) hypertension: Secondary | ICD-10-CM

## 2021-11-28 DIAGNOSIS — E559 Vitamin D deficiency, unspecified: Secondary | ICD-10-CM

## 2021-11-28 DIAGNOSIS — J208 Acute bronchitis due to other specified organisms: Secondary | ICD-10-CM

## 2021-11-28 MED ORDER — VITAMIN D (ERGOCALCIFEROL) 1.25 MG (50000 UNIT) PO CAPS: 50000.0000 [IU] | ORAL_CAPSULE | ORAL | 0 refills | Status: DC

## 2021-11-28 MED ORDER — MAGNESIUM OXIDE -MG SUPPLEMENT 400 (240 MG) MG PO TABS: 400.0000 mg | ORAL_TABLET | Freq: Two times a day (BID) | ORAL | 1 refills | Status: DC

## 2021-11-28 MED ORDER — AMLODIPINE BESYLATE 5 MG PO TABS: 5.0000 mg | ORAL_TABLET | Freq: Every day | ORAL | 2 refills | Status: DC

## 2021-11-28 MED ORDER — METHYLPREDNISOLONE 4 MG PO TBPK: ORAL_TABLET | ORAL | 0 refills | Status: DC

## 2021-11-28 NOTE — Progress Notes (Unsigned)
Established Patient Office Visit  Subjective:  Patient ID: Trevor [name], male    DOB: 1971-03-01  Age: 51 y.o. MRN: [id number]  CC:  Chief Complaint  Patient presents with   Follow-up    Pt not feeling well states we went to ED this morning but left due to wait, c/o mucus trying to come up, he is unable to drink water, would like something to knock the mucus out, has not been able to keep any food down.     HPI Trevor [name] is a 51 y.o. male with past medical history of HTN, GERD presents with c/o of increased phlegm in his chest. Acute Bronchitis:c/o of a non-productive cough for 1 week with a build of phlegm in his chest. No fever or chills reported. No sinus pain , pressure or sore throat noted.   Past Medical History:  Diagnosis Date   Diabetes mellitus without complication ([place])    Esophageal cancer ([place])    Hypertension     Past Surgical History:  Procedure Laterality Date   BIOPSY  08/26/2021   Procedure: BIOPSY;  Surgeon: Eloise Harman, DO;  Location: [facility];  Service: Endoscopy;;   ESOPHAGOGASTRODUODENOSCOPY (EGD) WITH PROPOFOL N/A 08/26/2021   Procedure: ESOPHAGOGASTRODUODENOSCOPY (EGD) WITH PROPOFOL;  Surgeon: Eloise Harman, DO;  Location: [facility];  Service: Endoscopy;  Laterality: N/A;  2:00pm   GASTROSTOMY N/A 10/12/2021   Procedure: OPEN GASTROSTOMY TUBE;  Surgeon: Aviva Signs, MD;  Location: [facility] ORS;  Service: General;  Laterality: N/A;   PORTACATH PLACEMENT Left 10/12/2021   Procedure: INSERTION PORT-A-CATH;  Surgeon: Aviva Signs, MD;  Location: [facility];  Service: General;  Laterality: Left;    Family History  Problem Relation Age of Onset   Cancer Father        prostate   Cancer Sister    Colon cancer Neg Hx    Esophageal cancer Neg Hx     Social History   Socioeconomic History   Marital status: Divorced    Spouse name: Not on file   Number of children: Not on file   Years of education: Not on file   Highest education level: Not on file   Occupational History   Not on file  Tobacco Use   Smoking status: Some Days    Packs/day: 0.50    Types: Cigarettes   Smokeless tobacco: Never  Vaping Use   Vaping Use: Never used  Substance and Sexual Activity   Alcohol use: Not Currently    Alcohol/week: 14.0 standard drinks of alcohol    Types: 14 Cans of beer per week    Comment: 2- 3 16 oz beer daily.   Drug use: Never   Sexual activity: Not on file  Other Topics Concern   Not on file  Social History Narrative   Not on file   Social Determinants of Health   Financial Resource Strain: High Risk (09/28/2021)   Overall Financial Resource Strain (CARDIA)    Difficulty of Paying Living Expenses: Hard  Food Insecurity: Not on file  Transportation Needs: Not on file  Physical Activity: Not on file  Stress: Not on file  Social Connections: Not on file  Intimate Partner Violence: Not on file    Outpatient Medications Prior to Visit  Medication Sig Dispense Refill   allopurinol (ZYLOPRIM) 300 MG tablet Take 1 tablet (300 mg total) by mouth daily. 30 tablet 5   fluorouracil CALGB 75102 2,400 mg/m2 in sodium chloride 0.9 % 150 mL  Inject 2,400 mg/m2 into the vein over 48 hr. Every 2 weeks     FLUOROURACIL IV Inject into the vein every 14 (fourteen) days.     ibuprofen (ADVIL) 600 MG tablet Take 1 tablet (600 mg total) by mouth every 8 (eight) hours as needed. 90 tablet 1   LEUCOVORIN CALCIUM IV Inject into the vein every 14 (fourteen) days.     loperamide (IMODIUM) 2 MG capsule Take 1 capsule (2 mg total) by mouth as needed for diarrhea or loose stools (take as directed according to the instruction sheet you were given). 30 capsule 4   Misc Natural Products (MULTI-HERB PO) Take by mouth. Turbo max     nicotine (NICODERM CQ - DOSED IN MG/24 HOURS) 21 mg/24hr patch Place 1 patch (21 mg total) onto the skin daily. 28 patch 1   Nutritional Supplements (K[employer] STANDARD 1.4) LIQD Give one carton (325 ml) [employer] 1.4 via tube  5x/day. Flush tube with 60 ml water before and after each feeding. Drink by mouth or give via tube additional 2 cups (480 ml) water daily. This provides 2275 kcal, 100 grams protein, 1170 ml free water from formula (2250 ml total water with flushes). Meets 100% needs 1625 mL 10   OXALIPLATIN IV Inject into the vein every 14 (fourteen) days.     potassium chloride SA (KLOR-CON M) 20 MEQ tablet Take 2 tablets (40 mEq total) by mouth daily. 60 tablet 3   prochlorperazine (COMPAZINE) 10 MG tablet Take 1 tablet (10 mg total) by mouth every 6 (six) hours as needed for nausea or vomiting. 30 tablet 6   amLODipine (NORVASC) 5 MG tablet Take 1 tablet (5 mg total) by mouth daily. 30 tablet 1   magnesium oxide (MAG-OX) 400 (240 Mg) MG tablet Take 1 tablet (400 mg total) by mouth 2 (two) times daily. 60 tablet 1   Vitamin D, Ergocalciferol, (DRISDOL) 1.25 MG (50000 UNIT) CAPS capsule Take 1 capsule (50,000 Units total) by mouth every Monday. 12 capsule 0   No facility-administered medications prior to visit.    No Known Allergies  ROS Review of Systems  Constitutional:  Negative for chills and fever.  HENT:  Positive for congestion. Negative for sinus pain, sneezing and sore throat.   Respiratory:  Positive for cough.   Gastrointestinal:  Negative for nausea and vomiting.  Neurological:  Negative for dizziness and headaches.      Objective:    Physical Exam HENT:     Nose: No rhinorrhea.  Cardiovascular:     Rate and Rhythm: Normal rate and regular rhythm.     Pulses: Normal pulses.     Heart sounds: Normal heart sounds.  Pulmonary:     Effort: Pulmonary effort is normal.     Breath sounds: Normal breath sounds.     BP 121/83   Pulse (!) 114   Ht '5\' 6"'$  (1.676 m)   Wt 110 lb 1.9 oz (50 kg)   SpO2 99%   BMI 17.77 kg/m  Wt Readings from Last 3 Encounters:  11/28/21 110 lb 1.9 oz (50 kg)  11/23/21 116 lb (52.6 kg)  11/19/21 121 lb 12.8 oz (55.2 kg)    Lab Results  Component Value  Date   TSH 0.796 11/23/2021   Lab Results  Component Value Date   WBC 6.8 11/23/2021   HGB 9.2 (L) 11/23/2021   HCT 28.7 (L) 11/23/2021   MCV 75.3 (L) 11/23/2021   PLT 232 11/23/2021   Lab  Results  Component Value Date   NA 136 11/23/2021   K 3.5 11/23/2021   CO2 23 11/23/2021   GLUCOSE 105 (H) 11/23/2021   BUN 8 11/23/2021   CREATININE 0.74 11/23/2021   BILITOT 0.8 11/23/2021   ALKPHOS 70 11/23/2021   AST 25 11/23/2021   ALT 14 11/23/2021   PROT 6.7 11/23/2021   ALBUMIN 2.9 (L) 11/23/2021   CALCIUM 8.4 (L) 11/23/2021   ANIONGAP 7 11/23/2021   Lab Results  Component Value Date   CHOL 157 08/18/2021   Lab Results  Component Value Date   HDL 61 08/18/2021   Lab Results  Component Value Date   LDLCALC 83 08/18/2021   Lab Results  Component Value Date   TRIG 66 08/18/2021   Lab Results  Component Value Date   CHOLHDL 2.6 08/18/2021   Lab Results  Component Value Date   HGBA1C 5.2 08/18/2021      Assessment & Plan:   Problem List Items Addressed This Visit       Cardiovascular and Mediastinum   HTN (hypertension) (Chronic)   Relevant Medications   amLODipine (NORVASC) 5 MG tablet     Respiratory   Acute bronchitis - Primary    C/o of a non-productive cough for 1 week with a build of phlegm in his chest No fever or chills reported No sinus pain , pressure or sore throat noted Will treat with a short course of steroid taper  Encouraged to use OTC mucinex-DM      Relevant Medications   methylPREDNISolone (MEDROL DOSEPAK) 4 MG TBPK tablet     Other   Vitamin D deficiency   Relevant Medications   Vitamin D, Ergocalciferol, (DRISDOL) 1.25 MG (50000 UNIT) CAPS capsule   Hypomagnesemia   Relevant Medications   magnesium oxide (MAG-OX) 400 (240 Mg) MG tablet    Meds ordered this encounter  Medications   methylPREDNISolone (MEDROL DOSEPAK) 4 MG TBPK tablet    Sig: Take as the package instructed    Dispense:  1 each    Refill:  0   amLODipine  (NORVASC) 5 MG tablet    Sig: Take 1 tablet (5 mg total) by mouth daily.    Dispense:  90 tablet    Refill:  2   magnesium oxide (MAG-OX) 400 (240 Mg) MG tablet    Sig: Take 1 tablet (400 mg total) by mouth 2 (two) times daily.    Dispense:  60 tablet    Refill:  1   Vitamin D, Ergocalciferol, (DRISDOL) 1.25 MG (50000 UNIT) CAPS capsule    Sig: Take 1 capsule (50,000 Units total) by mouth every Monday.    Dispense:  12 capsule    Refill:  0    Follow-up: Return if symptoms worsen or fail to improve.    [name], [name]

## 2021-11-28 NOTE — Progress Notes (Signed)
Nutrition Follow-up:  Patient with stage IV esophageal cancer. He is receiving FOLFOX + Nivolumab q14d (started 7/5). S/p open G-tube on 6/21   Met with patient in clinic. Noted pt seen in ED earlier today. Patient reports presenting for evaluation of increased swallowing difficulty. Unable to view notes at this time. Patient reports he is unsure if he is tolerating Nutren 1.5 formula. Says he sat outside in heat then felt nauseas ~45 minutes after bolus followed by episode of diarrhea. Yesterday pt recalls drinking one runny egg by mouth at 8AM, one carton Nutren via tube 8-9AM, consumed one Boost orally 1 PM, pureed bean and beef burrito 2PM, one carton [employer] via tube 4PM, watermelon/orange fruit cup 7PM, one carton Nutren 9PM. Patient reports episode of diarrhea at 2:30 AM. He has given one carton of Nutren 1.5 before coming to work ~7:30 AM. Patient denies episodes of nausea, vomiting, diarrhea today.    Medications: reviewed   Labs: 8/5 - glucose 105  Anthropometrics: last weight 116 lb on 8/2 decreased   7/19 - 121 lb 12.8 oz  4% in 2 weeks - severe   Estimated Energy Needs - (updated secondary to wt loss: 30-35 g/52.7kg)  Kcals: 9806-9996 Protein: 84-100 Fluid: >1.5 L  5 cartons Nutren 1.5 provides 1875 kcal, 85 g protein, 955 ml free water from formula (1555 ml total water with 120 ml flush 5x/day)   NUTRITION DIAGNOSIS: Severe malnutrition continues    INTERVENTION:  Reviewed 24 hr dietary recall provided with patient - episode of "diarrhea" is likely not do to formula Educated on loose BM vs diarrhea given liquid diet Educated on food safety - encouraged pt to stop consuming undercooked eggs Encouraged oral intake only as tolerated  Reinforced education/importance of giving 5 cartons Nutren 1.5 Provided one box split guaze and syringes   MONITORING, EVALUATION, GOAL: pt will adhere to nutrition recommendations and tube feeding regimen to promote weight  gain   NEXT VISIT: Monday August 21

## 2021-11-28 NOTE — Patient Instructions (Signed)
I appreciate the opportunity to provide care to you today!    Please pick up your medication at the pharmacy    You can take OTC mucinex-DM as needed  Please continue to a heart-healthy diet and increase your physical activities. Try to exercise for 50mns at least three times a week.      It was a pleasure to see you and I look forward to continuing to work together on your health and well-being. Please do not hesitate to call the office if you need care or have questions about your care.   Have a wonderful day and week. With Gratitude, GAlvira MondayMSN, [name]-BC

## 2021-11-28 NOTE — Progress Notes (Unsigned)
foll

## 2021-11-29 ENCOUNTER — Other Ambulatory Visit: Payer: Self-pay

## 2021-11-29 DIAGNOSIS — J209 Acute bronchitis, unspecified: Secondary | ICD-10-CM | POA: Insufficient documentation

## 2021-11-29 NOTE — Assessment & Plan Note (Signed)
C/o of a non-productive cough for 1 week with a build of phlegm in his chest No fever or chills reported No sinus pain , pressure or sore throat noted Will treat with a short course of steroid taper  Encouraged to use OTC mucinex-DM

## 2021-12-07 ENCOUNTER — Telehealth: Payer: Self-pay | Admitting: Dietician

## 2021-12-07 ENCOUNTER — Inpatient Hospital Stay (HOSPITAL_BASED_OUTPATIENT_CLINIC_OR_DEPARTMENT_OTHER): Payer: 59 | Admitting: Hematology

## 2021-12-07 ENCOUNTER — Inpatient Hospital Stay: Payer: 59

## 2021-12-07 ENCOUNTER — Inpatient Hospital Stay (HOSPITAL_COMMUNITY): Payer: 59 | Attending: Hematology

## 2021-12-07 ENCOUNTER — Ambulatory Visit: Payer: 59 | Admitting: Dietician

## 2021-12-07 VITALS — BP 141/94 | HR 66 | Temp 96.6°F | Resp 18

## 2021-12-07 DIAGNOSIS — R69 Illness, unspecified: Secondary | ICD-10-CM | POA: Diagnosis not present

## 2021-12-07 DIAGNOSIS — Z8042 Family history of malignant neoplasm of prostate: Secondary | ICD-10-CM | POA: Diagnosis not present

## 2021-12-07 DIAGNOSIS — Z5112 Encounter for antineoplastic immunotherapy: Secondary | ICD-10-CM | POA: Diagnosis not present

## 2021-12-07 DIAGNOSIS — Z5111 Encounter for antineoplastic chemotherapy: Secondary | ICD-10-CM | POA: Insufficient documentation

## 2021-12-07 DIAGNOSIS — C155 Malignant neoplasm of lower third of esophagus: Secondary | ICD-10-CM | POA: Insufficient documentation

## 2021-12-07 DIAGNOSIS — C49A1 Gastrointestinal stromal tumor of esophagus: Secondary | ICD-10-CM | POA: Diagnosis not present

## 2021-12-07 DIAGNOSIS — F17210 Nicotine dependence, cigarettes, uncomplicated: Secondary | ICD-10-CM | POA: Diagnosis not present

## 2021-12-07 DIAGNOSIS — E876 Hypokalemia: Secondary | ICD-10-CM | POA: Diagnosis not present

## 2021-12-07 DIAGNOSIS — Z800 Family history of malignant neoplasm of digestive organs: Secondary | ICD-10-CM | POA: Diagnosis not present

## 2021-12-07 DIAGNOSIS — C159 Malignant neoplasm of esophagus, unspecified: Secondary | ICD-10-CM

## 2021-12-07 DIAGNOSIS — R634 Abnormal weight loss: Secondary | ICD-10-CM | POA: Diagnosis not present

## 2021-12-07 DIAGNOSIS — E119 Type 2 diabetes mellitus without complications: Secondary | ICD-10-CM | POA: Insufficient documentation

## 2021-12-07 DIAGNOSIS — R911 Solitary pulmonary nodule: Secondary | ICD-10-CM | POA: Diagnosis not present

## 2021-12-07 DIAGNOSIS — I10 Essential (primary) hypertension: Secondary | ICD-10-CM | POA: Diagnosis not present

## 2021-12-07 DIAGNOSIS — D509 Iron deficiency anemia, unspecified: Secondary | ICD-10-CM | POA: Insufficient documentation

## 2021-12-07 DIAGNOSIS — D500 Iron deficiency anemia secondary to blood loss (chronic): Secondary | ICD-10-CM

## 2021-12-07 LAB — CBC WITH DIFFERENTIAL/PLATELET
Abs Immature Granulocytes: 0.01 10*3/uL (ref 0.00–0.07)
Basophils Absolute: 0 10*3/uL (ref 0.0–0.1)
Basophils Relative: 1 %
Eosinophils Absolute: 0 10*3/uL (ref 0.0–0.5)
Eosinophils Relative: 0 %
HCT: 32.7 % — ABNORMAL LOW (ref 39.0–52.0)
Hemoglobin: 10.4 g/dL — ABNORMAL LOW (ref 13.0–17.0)
Immature Granulocytes: 0 %
Lymphocytes Relative: 31 %
Lymphs Abs: 1.8 10*3/uL (ref 0.7–4.0)
MCH: 24.6 pg — ABNORMAL LOW (ref 26.0–34.0)
MCHC: 31.8 g/dL (ref 30.0–36.0)
MCV: 77.3 fL — ABNORMAL LOW (ref 80.0–100.0)
Monocytes Absolute: 0.7 10*3/uL (ref 0.1–1.0)
Monocytes Relative: 12 %
Neutro Abs: 3.4 10*3/uL (ref 1.7–7.7)
Neutrophils Relative %: 56 %
Platelets: 260 10*3/uL (ref 150–400)
RBC: 4.23 MIL/uL (ref 4.22–5.81)
RDW: 22.5 % — ABNORMAL HIGH (ref 11.5–15.5)
WBC: 5.9 10*3/uL (ref 4.0–10.5)
nRBC: 0 % (ref 0.0–0.2)

## 2021-12-07 LAB — COMPREHENSIVE METABOLIC PANEL
ALT: 13 U/L (ref 0–44)
AST: 20 U/L (ref 15–41)
Albumin: 3.3 g/dL — ABNORMAL LOW (ref 3.5–5.0)
Alkaline Phosphatase: 68 U/L (ref 38–126)
Anion gap: 7 (ref 5–15)
BUN: 18 mg/dL (ref 6–20)
CO2: 21 mmol/L — ABNORMAL LOW (ref 22–32)
Calcium: 9.4 mg/dL (ref 8.9–10.3)
Chloride: 109 mmol/L (ref 98–111)
Creatinine, Ser: 1.06 mg/dL (ref 0.61–1.24)
GFR, Estimated: >60 mL/min (ref >60)
Glucose, Bld: 115 mg/dL — ABNORMAL HIGH (ref 70–99)
Potassium: 4.6 mmol/L (ref 3.5–5.1)
Sodium: 137 mmol/L (ref 135–145)
Total Bilirubin: 0.3 mg/dL (ref 0.3–1.2)
Total Protein: 7.4 g/dL (ref 6.5–8.1)

## 2021-12-07 LAB — MAGNESIUM: Magnesium: 2.1 mg/dL (ref 1.7–2.4)

## 2021-12-07 MED ORDER — SODIUM CHLORIDE 0.9% FLUSH: 10.0000 mL | INTRAVENOUS | Status: DC | PRN

## 2021-12-07 MED ORDER — OXALIPLATIN CHEMO INJECTION 100 MG/20ML
85.0000 mg/m2 | Freq: Once | INTRAVENOUS | Status: AC
  Administered 2021-12-07: 135 mg via INTRAVENOUS
  Filled 2021-12-07: qty 20

## 2021-12-07 MED ORDER — SODIUM CHLORIDE 0.9 % IV SOLN
2400.0000 mg/m2 | INTRAVENOUS | Status: DC
  Administered 2021-12-07: 3800 mg via INTRAVENOUS
  Filled 2021-12-07: qty 76

## 2021-12-07 MED ORDER — FLUOROURACIL CHEMO INJECTION 2.5 GM/50ML
400.0000 mg/m2 | Freq: Once | INTRAVENOUS | Status: AC
  Administered 2021-12-07: 650 mg via INTRAVENOUS
  Filled 2021-12-07: qty 13

## 2021-12-07 MED ORDER — PALONOSETRON HCL INJECTION 0.25 MG/5ML
INTRAVENOUS | Status: AC
  Filled 2021-12-07: qty 5

## 2021-12-07 MED ORDER — PALONOSETRON HCL INJECTION 0.25 MG/5ML
0.2500 mg | Freq: Once | INTRAVENOUS | Status: AC
  Administered 2021-12-07: 0.25 mg via INTRAVENOUS

## 2021-12-07 MED ORDER — HEPARIN SOD (PORK) LOCK FLUSH 100 UNIT/ML IV SOLN: 500.0000 [IU] | Freq: Once | INTRAVENOUS | Status: DC | PRN

## 2021-12-07 MED ORDER — SODIUM CHLORIDE 0.9 % IV SOLN: Freq: Once | INTRAVENOUS | Status: AC

## 2021-12-07 MED ORDER — SODIUM CHLORIDE 0.9 % IV SOLN
400.0000 mg | Freq: Once | INTRAVENOUS | Status: AC
  Administered 2021-12-07: 400 mg via INTRAVENOUS
  Filled 2021-12-07: qty 20

## 2021-12-07 MED ORDER — SODIUM CHLORIDE 0.9 % IV SOLN
10.0000 mg | Freq: Once | INTRAVENOUS | Status: AC
  Administered 2021-12-07: 10 mg via INTRAVENOUS
  Filled 2021-12-07: qty 10

## 2021-12-07 MED ORDER — LEUCOVORIN CALCIUM INJECTION 350 MG
400.0000 mg/m2 | Freq: Once | INTRAVENOUS | Status: AC
  Administered 2021-12-07: 632 mg via INTRAVENOUS
  Filled 2021-12-07: qty 31.6

## 2021-12-07 MED ORDER — MEGESTROL ACETATE 400 MG/10ML PO SUSP: 400.0000 mg | Freq: Two times a day (BID) | ORAL | 3 refills | Status: AC

## 2021-12-07 MED ORDER — DEXTROSE 5 % IV SOLN: Freq: Once | INTRAVENOUS | Status: AC

## 2021-12-07 MED ORDER — SODIUM CHLORIDE 0.9 % IV SOLN
240.0000 mg | Freq: Once | INTRAVENOUS | Status: AC
  Administered 2021-12-07: 240 mg via INTRAVENOUS
  Filled 2021-12-07: qty 24

## 2021-12-07 NOTE — Progress Notes (Signed)
Nutrition Follow-up:   Patient with stage IV esophageal cancer. He is receiving FOLFOX + Nivolumab q14d (started). S/p open G-tube on 6/21   Called patient at home telephone# while patient in clinic for infusion. Patient very adamant about not using [employer] formula in his PEG tube.  He was sent some [employer] shakes from his sister and he would prefer to have these shakes ordered through a DME.  He hasn't tried this formula yet.  He is currently having no pain with swallowing.  He has been drinking 2-3 can of chicken broth daily, he was doing frozen fruit smoothies but isn't doing them anymore. He is willing to trial PO smoothie with recipe with canned fruit in juice instead of frozen fruit, so smoothie is room temperature.  He states he has enough Vital Cuisine for today.  I told him he would need 4 carton/day.  He also said he has a few Ensure Plus.  He is going to see if they can give him a case of Ensure plus to get him through to Monday when he will be seen by RD. Tried to have a conversation about his weight loss but he wouldn't discuss his PO intake/enteral beyond being very vocal about not wanting to have the [employer] anymore.     Medications: reviewed   Labs: 12/07/21 Hgb 10.4 ( improving w/IV Venofer)   Anthropometrics: Weight loss 10.6# (9.1%) past 2 weeks   Weight: 12/07/21  105.4# 11/23/21  116#   Estimated Energy Needs - (updated secondary to wt loss: 30-35)   Kcals: 1500-1700 Protein: 72-94 Fluid: >1.5 L     NUTRITION DIAGNOSIS: Severe malnutrition continues      INTERVENTION:  Encouraged oral intake only as tolerated  Reinforced education/importance of giving 5 cartons Ensure plus 120 FWF before and after feeds     MONITORING, EVALUATION, GOAL: pt will adhere to nutrition recommendations and tube feeding regimen to promote weight gain.     NEXT VISIT: Monday August 21 in-person with usual RD  April Manson, RDN, LDN Registered Dietitian, [place] Part Time Remote (Usual office hours: Tuesday-Thursday) Mobile: [phone] Remote Office: [phone]

## 2021-12-07 NOTE — Progress Notes (Signed)
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  PRIOR THERAPY: none  NGS Results: not done  CURRENT THERAPY: FOLFOX + Nivolumab q14d  BRIEF ONCOLOGIC HISTORY:  Oncology History  Squamous cell esophageal cancer ([place])  09/13/2021 Initial Diagnosis   Squamous cell esophageal cancer ([place])   10/26/2021 -  Chemotherapy   Patient is on Treatment Plan : GASTROESOPHAGEAL FOLFOX + Nivolumab q14d       CANCER STAGING:  Cancer Staging  Squamous cell esophageal cancer ([facility] [place] [address]) Staging form: Esophagus - Squamous Cell Carcinoma, AJCC 8th Edition - Clinical stage from 09/13/2021: Stage IVB (cT3, cN1, cM1, G3, L: Lower) - Unsigned - Pathologic: No stage assigned - Unsigned   INTERVAL HISTORY:  Mr. Yasir Kitner, a 51 y.o. male, returns for toxicity assessment and cycle 4 of FOLFOX and nivolumab today.  He could not tolerate D[facility] [place] as the tube feeds were too thick and causing nausea.  His sister who is a Software engineer in [place] has sent him a case of Di Kindle is seen [facility] which has 22 g of protein and 520 cal.  He is drinking 4 cans by mouth as well as by PEG tube.  He started this about 1 week ago.  He lost 5 pounds since last time.  He had some nausea but denied any vomiting.  He also reportedly took a Medrol Dosepak which helped with his mucus.  He is eating soft foods like scrambled eggs and noodles.   REVIEW OF SYSTEMS:  Review of Systems  Constitutional:  Negative for appetite change and fatigue.  HENT:   Negative for trouble swallowing.   Respiratory:  Positive for cough and shortness of breath.   Cardiovascular:  Positive for palpitations.  Gastrointestinal:  Positive for nausea. Negative for diarrhea.  Skin:  Negative for rash.  Neurological:   Negative for numbness.  All other systems reviewed and are negative.   PAST MEDICAL/SURGICAL HISTORY:  Past Medical History:  Diagnosis Date   Diabetes mellitus without complication ([place])    Esophageal cancer ([place])    Hypertension    Past Surgical History:  Procedure Laterality Date   BIOPSY  08/26/2021   Procedure: BIOPSY;  Surgeon: Eloise Harman, DO;  Location[facility];  Service: Endoscopy;;   ESOPHAGOGASTRODUODENOSCOPY (EGD) WITH PROPOFOL N/A 08/26/2021   Procedure: ESOPHAGOGASTRODUODENOSCOPY (EGD) WITH PROPOFOL;  Surgeon: Eloise Harman, DO;  Location: [facility];  Service: Endoscopy;  Laterality: N/A;  2:00pm   GASTROSTOMY N/A 10/12/2021   Procedure: OPEN GASTROSTOMY TUBE;  Surgeon: Aviva Signs, MD;  Location: [facility] ORS;  Service: General;  Laterality: N/A;   PORTACATH PLACEMENT Left 10/12/2021   Procedure: INSERTION PORT-A-CATH;  Surgeon: Aviva Signs, MD;  Location: [facility] ORS;  Service: General;  Laterality: Left;    SOCIAL HISTORY:  Social History   Socioeconomic History   Marital status: Divorced    Spouse name: Not on file   Number of children: Not on file   Years of education: Not on file   Highest education level: Not on file  Occupational History   Not on file  Tobacco Use   Smoking status: Some Days    Packs/day: 0.50    Types: Cigarettes   Smokeless tobacco: Never  Vaping Use  Vaping Use: Never used  Substance and Sexual Activity   Alcohol use: Not Currently    Alcohol/week: 14.0 standard drinks of alcohol    Types: 14 Cans of beer per week    Comment: 2- 3 16 oz beer daily.   Drug use: Never   Sexual activity: Not on file  Other Topics Concern   Not on file  Social History Narrative   Not on file   Social Determinants of Health   Financial Resource Strain: High Risk (09/28/2021)   Overall Financial Resource Strain (CARDIA)    Difficulty of Paying Living Expenses: Hard  Food Insecurity: Not on file  Transportation Needs: Not on file   Physical Activity: Not on file  Stress: Not on file  Social Connections: Not on file  Intimate Partner Violence: Not on file    FAMILY HISTORY:  Family History  Problem Relation Age of Onset   Cancer Father        prostate   Cancer Sister    Colon cancer Neg Hx    Esophageal cancer Neg Hx     CURRENT MEDICATIONS:  Current Outpatient Medications  Medication Sig Dispense Refill   allopurinol (ZYLOPRIM) 300 MG tablet Take 1 tablet (300 mg total) by mouth daily. 30 tablet 5   amLODipine (NORVASC) 5 MG tablet Take 1 tablet (5 mg total) by mouth daily. 90 tablet 2   fluorouracil CALGB 71062 2,400 mg/m2 in sodium chloride 0.9 % 150 mL Inject 2,400 mg/m2 into the vein over 48 hr. Every 2 weeks     FLUOROURACIL IV Inject into the vein every 14 (fourteen) days.     ibuprofen (ADVIL) 600 MG tablet Take 1 tablet (600 mg total) by mouth every 8 (eight) hours as needed. 90 tablet 1   LEUCOVORIN CALCIUM IV Inject into the vein every 14 (fourteen) days.     loperamide (IMODIUM) 2 MG capsule Take 1 capsule (2 mg total) by mouth as needed for diarrhea or loose stools (take as directed according to the instruction sheet you were given). 30 capsule 4   magnesium oxide (MAG-OX) 400 (240 Mg) MG tablet Take 1 tablet (400 mg total) by mouth 2 (two) times daily. 60 tablet 1   methylPREDNISolone (MEDROL DOSEPAK) 4 MG TBPK tablet Take as the package instructed 1 each 0   Misc Natural Products (MULTI-HERB PO) Take by mouth. Turbo max     nicotine (NICODERM CQ - DOSED IN MG/24 HOURS) 21 mg/24hr patch Place 1 patch (21 mg total) onto the skin daily. 28 patch 1   Nutritional Supplements (KATE FARMS STANDARD 1.4) LIQD Give one carton (325 ml) [employer] 1.4 via tube 5x/day. Flush tube with 60 ml water before and after each feeding. Drink by mouth or give via tube additional 2 cups (480 ml) water daily. This provides 2275 kcal, 100 grams protein, 1170 ml free water from formula (2250 ml total water with flushes).  Meets 100% needs 1625 mL 10   OXALIPLATIN IV Inject into the vein every 14 (fourteen) days.     potassium chloride SA (KLOR-CON M) 20 MEQ tablet Take 2 tablets (40 mEq total) by mouth daily. 60 tablet 3   prochlorperazine (COMPAZINE) 10 MG tablet Take 1 tablet (10 mg total) by mouth every 6 (six) hours as needed for nausea or vomiting. 30 tablet 6   Vitamin D, Ergocalciferol, (DRISDOL) 1.25 MG (50000 UNIT) CAPS capsule Take 1 capsule (50,000 Units total) by mouth every Monday. 12 capsule 0   No  current facility-administered medications for this visit.    ALLERGIES:  No Known Allergies  PHYSICAL EXAM:  Performance status (ECOG): 0 - Asymptomatic  There were no vitals filed for this visit. Wt Readings from Last 3 Encounters:  12/07/21 105 lb 6.4 oz (47.8 kg)  11/28/21 110 lb 1.9 oz (50 kg)  11/23/21 116 lb (52.6 kg)   Physical Exam Vitals reviewed.  Constitutional:      Appearance: Normal appearance.  Cardiovascular:     Rate and Rhythm: Normal rate and regular rhythm.     Pulses: Normal pulses.     Heart sounds: Normal heart sounds.  Pulmonary:     Effort: Pulmonary effort is normal.     Breath sounds: Normal breath sounds.  Neurological:     General: No focal deficit present.     Mental Status: He is alert and oriented to person, place, and time.  Psychiatric:        Mood and Affect: Mood normal.        Behavior: Behavior normal.    LABORATORY DATA:  I have reviewed the labs as listed.     Latest Ref Rng & Units 12/07/2021    8:26 AM 11/23/2021    8:15 AM 11/09/2021    7:58 AM  CBC  WBC 4.0 - 10.5 K/uL 5.9  6.8  6.7   Hemoglobin 13.0 - 17.0 g/dL 10.4  9.2  8.8   Hematocrit 39.0 - 52.0 % 32.7  28.7  27.3   Platelets 150 - 400 K/uL 260  232  383       Latest Ref Rng & Units 12/07/2021    8:26 AM 11/23/2021    8:15 AM 11/09/2021    7:58 AM  CMP  Glucose 70 - 99 mg/dL 115  105  100   BUN 6 - 20 mg/dL _0 Creatinine 0.61 - 1.24 mg/dL 1.06  0.74  0.64   Sodium  135 - 145 mmol/L 137  136  140   Potassium 3.5 - 5.1 mmol/L 4.6  3.5  3.3   Chloride 98 - 111 mmol/L 109  106  110   CO2 22 - 32 mmol/L _1 Calcium 8.9 - 10.3 mg/dL 9.4  8.4  8.5   Total Protein 6.5 - 8.1 g/dL 7.4  6.7  6.4   Total Bilirubin 0.3 - 1.2 mg/dL 0.3  0.8  0.5   Alkaline Phos 38 - 126 U/L 68  70  62   AST 15 - 41 U/L _2 ALT 0 - 44 U/L _3 DIAGNOSTIC IMAGING:  I have independently reviewed the scans and discussed with the patient. No results found.   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 - 34 pound weight loss in the last 2 months. - EGD (08/26/2021): A large fungating ulcerating mass found in the lower third of the esophagus, 30 cm from incisors.  Nearly obstructing and circumferential.  Scope could not be advanced. - Pathology: Invasive moderately to poorly differentiated squamous cell carcinoma. - CT CAP with contrast (09/08/2021): Circumferential irregular wall thickening along the length of approximately 9 cm in the mid and distal thoracic esophagus.  Haziness in the periesophageal fat suggesting transmural extension of the tumor.  Small adjacent 6 mm paraesophageal lymph node.  1.3 cm endobronchial versus peribronchial smooth  nodule adjacent to the segmental bronchus within the lingula.  Differential includes intrapulmonary lymph node/endobronchial neoplasm. - PET scan (09/22/2021): Left lung nodule PET positive.  Left deltoid uptake SUV 6.9, left erector spine musculature adjacent to L3 transverse process SUV 8.4, left gluteal activity with SUV 2.97.  Positive for large esophageal mass involving half to one third of the distal esophagus with upper abdominal nodal metastasis. - Left deltoid biopsy (10/13/2021): Keratinizing squamous cell carcinoma - FOLFOX + Nivolumab started on 10/26/2021 - [facility] NGS: PD-L1 CPS 15%, HER2 negative, MSI-stable    Social/family history: - He  lives by himself.  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 - Current active smoker, half pack per day for 10 years.  He drinks beer daily. - Father had colon cancer and sister had stomach cancer.   PLAN:  Stage IV (T3N1 M1 G3) lower esophageal squamous cell carcinoma: - He has tolerated cycle 3 of FOLFOX and nivolumab very well. - Reviewed labs today which showed normal LFTs.  Creatinine is high even though in the normal range.  Recommend increase fluid intake.  CBC shows improved hemoglobin.  Proceed with cycle 4 today without any dose modifications as he does not have any immunotherapy related side effects.  We will plan to repeat CT scans after cycle 6 to evaluate response.  RTC 4 weeks for follow-up.  Continue treatment every 2 weeks.  2.  Weight loss: - He has difficulty taking in [facility] as it causes nausea.       - He started drinking by mouth and via PEG tube vital Cuisine shake 520 cal, with 22 g of protein, 4 cans/day about 1 week ago.  These were sent to him by his pharmacist sister and [place].  He is tolerating these better.  We have reached out to our dietitian to see if we can provide these cans.  He lost about 5 pounds since last visit 2 weeks ago.  We will start him on appetite stimulant with Megace 400 mg twice daily.  3.  Hypokalemia: - Continue potassium 40 mEq daily.  Potassium today is normal.  4.  Microcytic anemia: - His hemoglobin improved to 10 point 2:04 doses of Venofer.  He will receive last dose today.   Orders placed this encounter:  No orders of the defined types were placed in this encounter.    Derek Jack, MD [place] [phone]

## 2021-12-07 NOTE — Patient Instructions (Addendum)
[facility] at [facility] Discharge Instructions   You were seen and examined today by Dr. Delton Coombes.  He reviewed the results of your lab work which are normal/stable.  We will proceed with your treatment today.   Increase your water intake to help flush your kidneys.   Return as scheduled.    Thank you for choosing [facility] at [facility] to provide your oncology and hematology care.  To afford each patient quality time with our provider, please arrive at least 15 minutes before your scheduled appointment time.   If you have a lab appointment with the [facility] please come in thru the Main Entrance and check in at the main information desk.  You need to re-schedule your appointment should you arrive 10 or more minutes late.  We strive to give you quality time with our providers, and arriving late affects you and other patients whose appointments are after yours.  Also, if you no show three or more times for appointments you may be dismissed from the clinic at the providers discretion.     Again, thank you for choosing [facility].  Our hope is that these requests will decrease the amount of time that you wait before being seen by our physicians.       _____________________________________________________________  Should you have questions after your visit to [facility], please contact our office at [phone] and follow the prompts.  Our office hours are 8:00 a.m. and 4:30 p.m. Monday - Friday.  Please note that voicemails left after 4:00 p.m. may not be returned until the following business day.  We are closed weekends and major holidays.  You do have access to a nurse 24-7, just call the main number to the clinic [phone] and do not press any options, hold on the line and a nurse will answer the phone.    For prescription refill requests, have your pharmacy contact our office and allow 72 hours.     Due to Covid, you will need to wear a mask upon entering the hospital. If you do not have a mask, a mask will be given to you at the Main Entrance upon arrival. For doctor visits, patients may have 1 support person age 42 or older with them. For treatment visits, patients can not have anyone with them due to social distancing guidelines and our immunocompromised population.

## 2021-12-07 NOTE — Patient Instructions (Signed)
[name]  Discharge Instructions: Thank you for choosing [facility] to provide your oncology and hematology care.  If you have a lab appointment with the [facility], please come in thru the Main Entrance and check in at the main information desk.  Wear comfortable clothing and clothing appropriate for easy access to any Portacath or PICC line.   We strive to give you quality time with your provider. You may need to reschedule your appointment if you arrive late (15 or more minutes).  Arriving late affects you and other patients whose appointments are after yours.  Also, if you miss three or more appointments without notifying the office, you may be dismissed from the clinic at the provider's discretion.      For prescription refill requests, have your pharmacy contact our office and allow 72 hours for refills to be completed.    Today you received the following chemotherapy and/or immunotherapy agents Opdivo/Folfox/5FU pump start      To help prevent nausea and vomiting after your treatment, we encourage you to take your nausea medication as directed.  BELOW ARE SYMPTOMS THAT SHOULD BE REPORTED IMMEDIATELY: *FEVER GREATER THAN 100.4 F (38 C) OR HIGHER *CHILLS OR SWEATING *NAUSEA AND VOMITING THAT IS NOT CONTROLLED WITH YOUR NAUSEA MEDICATION *UNUSUAL SHORTNESS OF BREATH *UNUSUAL BRUISING OR BLEEDING *URINARY PROBLEMS (pain or burning when urinating, or frequent urination) *BOWEL PROBLEMS (unusual diarrhea, constipation, pain near the anus) TENDERNESS IN MOUTH AND THROAT WITH OR WITHOUT PRESENCE OF ULCERS (sore throat, sores in mouth, or a toothache) UNUSUAL RASH, SWELLING OR PAIN  UNUSUAL VAGINAL DISCHARGE OR ITCHING   Items with * indicate a potential emergency and should be followed up as soon as possible or go to the Emergency Department if any problems should occur.  Please show the CHEMOTHERAPY ALERT CARD or IMMUNOTHERAPY ALERT CARD at  check-in to the Emergency Department and triage nurse.  Should you have questions after your visit or need to cancel or reschedule your appointment, please contact [facility] [phone]  and follow the prompts.  Office hours are 8:00 a.m. to 4:30 p.m. Monday - Friday. Please note that voicemails left after 4:00 p.m. may not be returned until the following business day.  We are closed weekends and major holidays. You have access to a nurse at all times for urgent questions. Please call the main number to the clinic [phone] and follow the prompts.  For any non-urgent questions, you may also contact your provider using MyChart. We now offer e-Visits for anyone 50 and older to request care online for non-urgent symptoms. For details visit [email or web address].   Also download the MyChart app! Go to the app store, search "MyChart", open the app, select [facility], and log in with your MyChart username and password.  Masks are optional in the cancer centers. If you would like for your care team to wear a mask while they are taking care of you, please let them know. You may have one support person who is at least 53 years old accompany you for your appointments.

## 2021-12-07 NOTE — Progress Notes (Signed)
Patient presents today for Opdivo/FOLFOX with pump start per providers order.  Vital signs reviewed, HR noted to be elevated at 114, MD notified.  Labs pending.  Labs within parameters for treatment.  HR rechecked and is 99.  Message received from [place] Patient okay for treatment.  Patient is also getting Venofer 400 mg today per providers order.  Treatment given today per MD orders.  Stable during infusion without adverse affects.  5FU pump started and verified RUN on the screen with the patient.  Vital signs stable.  No complaints at this time.  Discharge from clinic ambulatory in stable condition.  Alert and oriented X 3.  Follow up with [facility] as scheduled.

## 2021-12-07 NOTE — Patient Instructions (Signed)
[facility]  Discharge Instructions: Thank you for choosing [facility] to provide your oncology and hematology care.  If you have a lab appointment with the [facility], please come in thru the Main Entrance and check in at the main information desk.  Wear comfortable clothing and clothing appropriate for easy access to any Portacath or PICC line.   We strive to give you quality time with your provider. You may need to reschedule your appointment if you arrive late (15 or more minutes).  Arriving late affects you and other patients whose appointments are after yours.  Also, if you miss three or more appointments without notifying the office, you may be dismissed from the clinic at the provider's discretion.      For prescription refill requests, have your pharmacy contact our office and allow 72 hours for refills to be completed.    Today you received the following chemotherapy and/or immunotherapy agents Opdivo, Folfox with pump start.      To help prevent nausea and vomiting after your treatment, we encourage you to take your nausea medication as directed.  BELOW ARE SYMPTOMS THAT SHOULD BE REPORTED IMMEDIATELY: *FEVER GREATER THAN 100.4 F (38 C) OR HIGHER *CHILLS OR SWEATING *NAUSEA AND VOMITING THAT IS NOT CONTROLLED WITH YOUR NAUSEA MEDICATION *UNUSUAL SHORTNESS OF BREATH *UNUSUAL BRUISING OR BLEEDING *URINARY PROBLEMS (pain or burning when urinating, or frequent urination) *BOWEL PROBLEMS (unusual diarrhea, constipation, pain near the anus) TENDERNESS IN MOUTH AND THROAT WITH OR WITHOUT PRESENCE OF ULCERS (sore throat, sores in mouth, or a toothache) UNUSUAL RASH, SWELLING OR PAIN  UNUSUAL VAGINAL DISCHARGE OR ITCHING   Items with * indicate a potential emergency and should be followed up as soon as possible or go to the Emergency Department if any problems should occur.  Please show the CHEMOTHERAPY ALERT CARD or IMMUNOTHERAPY ALERT CARD at  check-in to the Emergency Department and triage nurse.  Should you have questions after your visit or need to cancel or reschedule your appointment, please contact [facility] [phone]  and follow the prompts.  Office hours are 8:00 a.m. to 4:30 p.m. Monday - Friday. Please note that voicemails left after 4:00 p.m. may not be returned until the following business day.  We are closed weekends and major holidays. You have access to a nurse at all times for urgent questions. Please call the main number to the clinic [phone] and follow the prompts.  For any non-urgent questions, you may also contact your provider using MyChart. We now offer e-Visits for anyone 43 and older to request care online for non-urgent symptoms. For details visit [email or web address].   Also download the MyChart app! Go to the app store, search "MyChart", open the app, select [facility], and log in with your MyChart username and password.  Masks are optional in the cancer centers. If you would like for your care team to wear a mask while they are taking care of you, please let them know. You may have one support person who is at least 53 years old accompany you for your appointments.

## 2021-12-07 NOTE — Telephone Encounter (Signed)
Patient referred for question regarding TF formula. First attempt to reach. Tried calling his mobile but brother answered. Let his brother know I would try calling back this afternoon after his infusion for a telephone nutrition consult.  April Manson, RDN, LDN Registered Dietitian, [facility] Part Time Remote (Usual office hours: Tuesday-Thursday) Cell: [phone]

## 2021-12-07 NOTE — Progress Notes (Signed)
Patients port flushed without difficulty.  Good blood return noted with no bruising or swelling noted at site.  Stable during access and blood draw.  Patient to remain accessed for treatment. 

## 2021-12-09 ENCOUNTER — Inpatient Hospital Stay: Payer: 59

## 2021-12-09 ENCOUNTER — Encounter: Payer: Self-pay | Admitting: *Deleted

## 2021-12-09 VITALS — BP 104/79 | HR 89 | Temp 97.5°F | Resp 18

## 2021-12-09 DIAGNOSIS — R911 Solitary pulmonary nodule: Secondary | ICD-10-CM | POA: Diagnosis not present

## 2021-12-09 DIAGNOSIS — Z5111 Encounter for antineoplastic chemotherapy: Secondary | ICD-10-CM | POA: Diagnosis not present

## 2021-12-09 DIAGNOSIS — Z800 Family history of malignant neoplasm of digestive organs: Secondary | ICD-10-CM | POA: Diagnosis not present

## 2021-12-09 DIAGNOSIS — R69 Illness, unspecified: Secondary | ICD-10-CM | POA: Diagnosis not present

## 2021-12-09 DIAGNOSIS — E119 Type 2 diabetes mellitus without complications: Secondary | ICD-10-CM | POA: Diagnosis not present

## 2021-12-09 DIAGNOSIS — R634 Abnormal weight loss: Secondary | ICD-10-CM | POA: Diagnosis not present

## 2021-12-09 DIAGNOSIS — E876 Hypokalemia: Secondary | ICD-10-CM | POA: Diagnosis not present

## 2021-12-09 DIAGNOSIS — C155 Malignant neoplasm of lower third of esophagus: Secondary | ICD-10-CM | POA: Diagnosis not present

## 2021-12-09 DIAGNOSIS — Z5112 Encounter for antineoplastic immunotherapy: Secondary | ICD-10-CM | POA: Diagnosis not present

## 2021-12-09 DIAGNOSIS — C159 Malignant neoplasm of esophagus, unspecified: Secondary | ICD-10-CM

## 2021-12-09 DIAGNOSIS — D509 Iron deficiency anemia, unspecified: Secondary | ICD-10-CM | POA: Diagnosis not present

## 2021-12-09 DIAGNOSIS — I10 Essential (primary) hypertension: Secondary | ICD-10-CM | POA: Diagnosis not present

## 2021-12-09 DIAGNOSIS — Z8042 Family history of malignant neoplasm of prostate: Secondary | ICD-10-CM | POA: Diagnosis not present

## 2021-12-09 MED ORDER — SODIUM CHLORIDE 0.9% FLUSH
10.0000 mL | INTRAVENOUS | Status: DC | PRN
  Administered 2021-12-09: 10 mL

## 2021-12-09 MED ORDER — HEPARIN SOD (PORK) LOCK FLUSH 100 UNIT/ML IV SOLN
500.0000 [IU] | Freq: Once | INTRAVENOUS | Status: AC | PRN
  Administered 2021-12-09: 500 [IU]

## 2021-12-09 NOTE — Patient Instructions (Signed)
[facility]  Discharge Instructions: Thank you for choosing [facility] to provide your oncology and hematology care.  If you have a lab appointment with the [facility], please come in thru the Main Entrance and check in at the main information desk.  Wear comfortable clothing and clothing appropriate for easy access to any Portacath or PICC line.   We strive to give you quality time with your provider. You may need to reschedule your appointment if you arrive late (15 or more minutes).  Arriving late affects you and other patients whose appointments are after yours.  Also, if you miss three or more appointments without notifying the office, you may be dismissed from the clinic at the provider's discretion.      For prescription refill requests, have your pharmacy contact our office and allow 72 hours for refills to be completed.    Today you received chemotherapy pump disconnection.     BELOW ARE SYMPTOMS THAT SHOULD BE REPORTED IMMEDIATELY: *FEVER GREATER THAN 100.4 F (38 C) OR HIGHER *CHILLS OR SWEATING *NAUSEA AND VOMITING THAT IS NOT CONTROLLED WITH YOUR NAUSEA MEDICATION *UNUSUAL SHORTNESS OF BREATH *UNUSUAL BRUISING OR BLEEDING *URINARY PROBLEMS (pain or burning when urinating, or frequent urination) *BOWEL PROBLEMS (unusual diarrhea, constipation, pain near the anus) TENDERNESS IN MOUTH AND THROAT WITH OR WITHOUT PRESENCE OF ULCERS (sore throat, sores in mouth, or a toothache) UNUSUAL RASH, SWELLING OR PAIN  UNUSUAL VAGINAL DISCHARGE OR ITCHING   Items with * indicate a potential emergency and should be followed up as soon as possible or go to the Emergency Department if any problems should occur.  Please show the CHEMOTHERAPY ALERT CARD or IMMUNOTHERAPY ALERT CARD at check-in to the Emergency Department and triage nurse.  Should you have questions after your visit or need to cancel or reschedule your appointment, please contact  [facility] [phone]  and follow the prompts.  Office hours are 8:00 a.m. to 4:30 p.m. Monday - Friday. Please note that voicemails left after 4:00 p.m. may not be returned until the following business day.  We are closed weekends and major holidays. You have access to a nurse at all times for urgent questions. Please call the main number to the clinic [phone] and follow the prompts.  For any non-urgent questions, you may also contact your provider using MyChart. We now offer e-Visits for anyone 40 and older to request care online for non-urgent symptoms. For details visit [email or web address].   Also download the MyChart app! Go to the app store, search "MyChart", open the app, select [facility], and log in with your MyChart username and password.  Masks are optional in the cancer centers. If you would like for your care team to wear a mask while they are taking care of you, please let them know. You may have one support person who is at least 53 years old accompany you for your appointments.

## 2021-12-09 NOTE — Progress Notes (Signed)
Pt presents today for 5FU chemotherapy pump disconnection per provider's order. Vital signs stable. Port flushed easily without difficulty with 10 mL of normal saline and 52m of heparin. Good blood return noted and needle removed intact. No bruising  or swelling noted.  Discharged from clinic ambulatory in stable condition. Alert and oriented x 3. [facility]

## 2021-12-12 ENCOUNTER — Inpatient Hospital Stay: Payer: 59 | Admitting: Dietician

## 2021-12-12 NOTE — Progress Notes (Signed)
Nutrition Follow-up:  Patient with stage IV esophageal cancer. He is receiving FOLFOX + Nivolumab q14d (started 7/5). S/p open G-tube on 6/21  Patient reports feeling fatigued from chemotherapy. He left work early today. Nutrition follow-up completed via telephone. Patient reports "getting 1050 calories in me this morning before work." Patient recalls giving 3 Ensure Plus via tube. He waited ~5 minutes between each carton. Patient then flushed tube with 32 ounces of water. He endorses episode of diarrhea 15 minutes after this. Patient states he is only tolerating thin liquids by mouth (water, apple juice, broth). Patient reports he does not like Dillard [employer], this is too thick and makes him feel nauseas. His sister who is a Software engineer from [place] sent him Vital Cuisine to try. He is unable to drink this orally anymore due to nectar thick consistency. Patient reports having cases of Nutren 1.5 at home and tolerates this well, however he has not been using this. Patient reports having written bolus regimen at home.    Medications: reviewed   Labs: 8/16 labs reviewed   Anthropometrics: Last weight 105 lb 6.4 oz on 8/16 decreased 9.1% in 2 weeks; this is severe  8/2 - 116  Estimated Energy Needs: new secondary to wt loss (33-36g/kg)  Kcals: 5208-0223 Protein: 77-96 Fluid: >/= 1.5 L  NUTRITION DIAGNOSIS: Severe malnutrition continues    INTERVENTION:  Reinforced education on following feeding schedule instead of giving high volume bolus as this continues to give him diarrhea. Patient reports acknowledgement he does not allow for absorption of nutrition promoting further weight loss Pt agreeable to following bolus regimen as outlined below. He has written schedule Financial counselor has contacted insurance company for [address] on [place] (awaiting response) however educated on this as oral supplement and if tolerating Nutren 1.5 recommend using as this provides sole source nutrition Pt to  return unopened case of Dillard [employer]  Encouraged oral intake of liquids as tolerated   5 cartons Nutren 1.5 - give one 250 ml carton q3h (7AM-7PM) provides 1875 kcal, 85 g protein, 955 ml free water from formula (1555 ml total water with 120 ml flush 5x/day)    MONITORING, EVALUATION, GOAL:  pt will adhere to nutrition recommendations and tube feeding regimen to promote weight gain    NEXT VISIT: Monday August 28

## 2021-12-13 ENCOUNTER — Telehealth: Payer: Self-pay | Admitting: Hematology

## 2021-12-13 NOTE — Telephone Encounter (Signed)
Pc to [name] provider services, regarding coverage for [place].CPT [phone] CSR stating she would return my call after further review of the pts benefits. She stated she doesn't think it is covered but wanted to double check.

## 2021-12-15 ENCOUNTER — Other Ambulatory Visit: Payer: Self-pay

## 2021-12-15 ENCOUNTER — Emergency Department (HOSPITAL_COMMUNITY)
Admission: EM | Admit: 2021-12-15 | Discharge: 2021-12-15 | Disposition: A | Discharge status: Home / Self Care | Payer: 59 | Attending: Emergency Medicine | Admitting: Emergency Medicine

## 2021-12-15 ENCOUNTER — Emergency Department (HOSPITAL_COMMUNITY): Payer: 59

## 2021-12-15 ENCOUNTER — Encounter (HOSPITAL_COMMUNITY): Payer: Self-pay | Admitting: Emergency Medicine

## 2021-12-15 DIAGNOSIS — Z79899 Other long term (current) drug therapy: Secondary | ICD-10-CM | POA: Insufficient documentation

## 2021-12-15 DIAGNOSIS — F17200 Nicotine dependence, unspecified, uncomplicated: Secondary | ICD-10-CM | POA: Diagnosis not present

## 2021-12-15 DIAGNOSIS — R000 Tachycardia, unspecified: Secondary | ICD-10-CM | POA: Diagnosis not present

## 2021-12-15 DIAGNOSIS — R0602 Shortness of breath: Secondary | ICD-10-CM | POA: Diagnosis not present

## 2021-12-15 DIAGNOSIS — I10 Essential (primary) hypertension: Secondary | ICD-10-CM | POA: Diagnosis not present

## 2021-12-15 DIAGNOSIS — R002 Palpitations: Secondary | ICD-10-CM | POA: Diagnosis not present

## 2021-12-15 DIAGNOSIS — R944 Abnormal results of kidney function studies: Secondary | ICD-10-CM | POA: Diagnosis not present

## 2021-12-15 DIAGNOSIS — E119 Type 2 diabetes mellitus without complications: Secondary | ICD-10-CM | POA: Diagnosis not present

## 2021-12-15 DIAGNOSIS — R911 Solitary pulmonary nodule: Secondary | ICD-10-CM

## 2021-12-15 DIAGNOSIS — E876 Hypokalemia: Secondary | ICD-10-CM | POA: Diagnosis not present

## 2021-12-15 DIAGNOSIS — C159 Malignant neoplasm of esophagus, unspecified: Secondary | ICD-10-CM | POA: Diagnosis not present

## 2021-12-15 DIAGNOSIS — R197 Diarrhea, unspecified: Secondary | ICD-10-CM | POA: Insufficient documentation

## 2021-12-15 DIAGNOSIS — E871 Hypo-osmolality and hyponatremia: Secondary | ICD-10-CM

## 2021-12-15 DIAGNOSIS — E860 Dehydration: Secondary | ICD-10-CM | POA: Diagnosis not present

## 2021-12-15 DIAGNOSIS — R109 Unspecified abdominal pain: Secondary | ICD-10-CM | POA: Insufficient documentation

## 2021-12-15 DIAGNOSIS — D649 Anemia, unspecified: Secondary | ICD-10-CM | POA: Insufficient documentation

## 2021-12-15 LAB — HEPATIC FUNCTION PANEL
ALT: 11 U/L (ref 0–44)
AST: 18 U/L (ref 15–41)
Albumin: 3.3 g/dL — ABNORMAL LOW (ref 3.5–5.0)
Alkaline Phosphatase: 61 U/L (ref 38–126)
Bilirubin, Direct: 0.1 mg/dL (ref 0.0–0.2)
Indirect Bilirubin: 0.6 mg/dL (ref 0.3–0.9)
Total Bilirubin: 0.7 mg/dL (ref 0.3–1.2)
Total Protein: 7.1 g/dL (ref 6.5–8.1)

## 2021-12-15 LAB — CBC WITH DIFFERENTIAL/PLATELET
Abs Immature Granulocytes: 0.05 10*3/uL (ref 0.00–0.07)
Basophils Absolute: 0 10*3/uL (ref 0.0–0.1)
Basophils Relative: 0 %
Eosinophils Absolute: 0 10*3/uL (ref 0.0–0.5)
Eosinophils Relative: 0 %
HCT: 30.3 % — ABNORMAL LOW (ref 39.0–52.0)
Hemoglobin: 9.7 g/dL — ABNORMAL LOW (ref 13.0–17.0)
Immature Granulocytes: 1 %
Lymphocytes Relative: 30 %
Lymphs Abs: 1.8 10*3/uL (ref 0.7–4.0)
MCH: 24.7 pg — ABNORMAL LOW (ref 26.0–34.0)
MCHC: 32 g/dL (ref 30.0–36.0)
MCV: 77.3 fL — ABNORMAL LOW (ref 80.0–100.0)
Monocytes Absolute: 0.5 10*3/uL (ref 0.1–1.0)
Monocytes Relative: 8 %
Neutro Abs: 3.6 10*3/uL (ref 1.7–7.7)
Neutrophils Relative %: 61 %
Platelets: 286 10*3/uL (ref 150–400)
RBC: 3.92 MIL/uL — ABNORMAL LOW (ref 4.22–5.81)
RDW: 21.8 % — ABNORMAL HIGH (ref 11.5–15.5)
WBC: 6 10*3/uL (ref 4.0–10.5)
nRBC: 0 % (ref 0.0–0.2)

## 2021-12-15 LAB — BASIC METABOLIC PANEL
Anion gap: 8 (ref 5–15)
BUN: 28 mg/dL — ABNORMAL HIGH (ref 6–20)
CO2: 19 mmol/L — ABNORMAL LOW (ref 22–32)
Calcium: 9.1 mg/dL (ref 8.9–10.3)
Chloride: 105 mmol/L (ref 98–111)
Creatinine, Ser: 1.05 mg/dL (ref 0.61–1.24)
GFR, Estimated: >60 mL/min (ref >60)
Glucose, Bld: 100 mg/dL — ABNORMAL HIGH (ref 70–99)
Potassium: 5 mmol/L (ref 3.5–5.1)
Sodium: 132 mmol/L — ABNORMAL LOW (ref 135–145)

## 2021-12-15 LAB — TROPONIN I (HIGH SENSITIVITY)
Troponin I (High Sensitivity): 13 ng/L (ref <18)
Troponin I (High Sensitivity): 11 ng/L (ref <18)

## 2021-12-15 LAB — LACTIC ACID, PLASMA: Lactic Acid, Venous: 1.1 mmol/L (ref 0.5–1.9)

## 2021-12-15 MED ORDER — IOHEXOL 350 MG/ML SOLN
75.0000 mL | Freq: Once | INTRAVENOUS | Status: AC | PRN
  Administered 2021-12-15: 75 mL via INTRAVENOUS

## 2021-12-15 MED ORDER — AZITHROMYCIN 250 MG PO TABS: 250.0000 mg | ORAL_TABLET | Freq: Every day | ORAL | 0 refills | Status: DC

## 2021-12-15 MED ORDER — LACTATED RINGERS IV BOLUS
1000.0000 mL | Freq: Once | INTRAVENOUS | Status: AC
  Administered 2021-12-15: 1000 mL via INTRAVENOUS

## 2021-12-15 NOTE — Discharge Instructions (Addendum)
As we discussed, we saw a new nodule on your lung on the lower right which could represent a new cancer nodule versus an early pneumonia. I think that your symptoms today were mostly from feeling a little dehydrated, and possibly your low sodium today.  Sinew to try to get plenty of oral intake, fluids, salt in your diet, and I recommend that you follow-up with your oncologist as soon as you are able to to discuss the new lung nodule on the right, and make sure that your symptoms that you are experiencing today are improving.  In the meantime get some rest and return to the emergency department if you are concerned about worsening shortness of breath, new fever, cough, chills.

## 2021-12-15 NOTE — ED Notes (Signed)
Port de-accessed

## 2021-12-15 NOTE — ED Notes (Signed)
ED Provider at bedside. 

## 2021-12-15 NOTE — ED Provider Notes (Signed)
[facility] Provider Note   CSN: [id number] Arrival date & time: 12/15/21  1250     History  Chief Complaint  Patient presents with   Palpitations    [name] is a 51 y.o. male with past medical history significant for diabetes, hypertension, alcohol abuse, gout, who is currently being treated for esophageal cancer who recently underwent colectomy with stoma in place in the left lower abdomen, and began chemotherapy 2 months ago who presents with concern for palpitations, shortness of breath, fatigue.  Patient reports that he began feeling like this yesterday, but they increased today while he was working.  Patient reports that he has been weak, and struggling to get enough calories and electrolytes, reports that he cannot afford the Pediasure that they recommend that he take.  He is taking oral food supplementation, reports that he has had some loose stool but no blood in stool.  He denies any dysuria, hematuria.  He reports some minimal abdominal pain at this time.  He reports that laying down in bed he is feeling a lot more comfortable than he was standing up.  He denies any chest pain at this time.  His last chemotherapy was 1 week ago today   Palpitations Associated symptoms: shortness of breath        Home Medications Prior to Admission medications   Medication Sig Start Date End Date Taking? Authorizing Provider  azithromycin (ZITHROMAX) 250 MG tablet Take 1 tablet (250 mg total) by mouth daily. Take first 2 tablets together, then 1 every day until finished. 12/15/21  Yes Dierdre Mccalip H, PA-C  allopurinol (ZYLOPRIM) 300 MG tablet Take 1 tablet (300 mg total) by mouth daily. 08/04/21   Sanjuana Kava, MD  amLODipine (NORVASC) 5 MG tablet Take 1 tablet (5 mg total) by mouth daily. 11/28/21   [name], [name]  fluorouracil CALGB 54270 2,400 mg/m2 in sodium chloride 0.9 % 150 mL Inject 2,400 mg/m2 into the vein over 48 hr. Every 2 weeks    [provider]  FLUOROURACIL IV Inject into the vein every 14 (fourteen) days.    [provider]  ibuprofen (ADVIL) 600 MG tablet Take 1 tablet (600 mg total) by mouth every 8 (eight) hours as needed. 08/18/21   [name], [name]  LEUCOVORIN CALCIUM IV Inject into the vein every 14 (fourteen) days.    [provider]  loperamide (IMODIUM) 2 MG capsule Take 1 capsule (2 mg total) by mouth as needed for diarrhea or loose stools (take as directed according to the instruction sheet you were given). 10/04/21   Derek Jack, MD  magnesium oxide (MAG-OX) 400 (240 Mg) MG tablet Take 1 tablet (400 mg total) by mouth 2 (two) times daily. 11/28/21   [name], [name]  megestrol (MEGACE) 400 MG/10ML suspension Take 10 mLs (400 mg total) by mouth 2 (two) times daily. 12/07/21   Derek Jack, MD  methylPREDNISolone (MEDROL DOSEPAK) 4 MG TBPK tablet Take as the package instructed 11/28/21   [name], [name]  Misc Natural Products (MULTI-HERB PO) Take by mouth. Turbo max    [provider]  nicotine (NICODERM CQ - DOSED IN MG/24 HOURS) 21 mg/24hr patch Place 1 patch (21 mg total) onto the skin daily. 10/27/21   Derek Jack, MD  Nutritional Supplements (KATE FARMS STANDARD 1.4) LIQD Give one carton (325 ml) [employer] 1.4 via tube 5x/day. Flush tube with 60 ml water before and after each feeding. Drink by mouth or give via  tube additional 2 cups (480 ml) water daily. This provides 2275 kcal, 100 grams protein, 1170 ml free water from formula (2250 ml total water with flushes). Meets 100% needs 11/21/21   Derek Jack, MD  OXALIPLATIN IV Inject into the vein every 14 (fourteen) days.    [provider]  potassium chloride SA (KLOR-CON M) 20 MEQ tablet Take 2 tablets (40 mEq total) by mouth daily. 10/27/21   Derek Jack, MD  prochlorperazine (COMPAZINE) 10 MG tablet Take 1 tablet (10 mg total) by mouth every 6 (six) hours as needed for nausea or  vomiting. 10/27/21   Derek Jack, MD  Vitamin D, Ergocalciferol, (DRISDOL) 1.25 MG (50000 UNIT) CAPS capsule Take 1 capsule (50,000 Units total) by mouth every Monday. 11/28/21   [name], [place]      Allergies    Patient has no known allergies.    Review of Systems   Review of Systems  Respiratory:  Positive for shortness of breath.   Cardiovascular:  Positive for palpitations.  All other systems reviewed and are negative.   Physical Exam Updated Vital Signs BP (!) 134/92 (BP Location: Left Arm)   Pulse 84   Temp 97.9 F (36.6 C) (Oral)   Resp 12   Ht '5\' 6"'$  (1.676 m)   Wt 47.8 kg   SpO2 100%   BMI 17.01 kg/m  Physical Exam Vitals and nursing note reviewed.  Constitutional:      General: He is not in acute distress.    Appearance: Normal appearance. He is ill-appearing.     Comments: Patient is somewhat ill-appearing at baseline, thin, malnourished, but nontoxic appearing at time my evaluation  HENT:     Head: Normocephalic and atraumatic.  Eyes:     General:        Right eye: No discharge.        Left eye: No discharge.  Cardiovascular:     Rate and Rhythm: Regular rhythm. Tachycardia present.     Heart sounds: No murmur heard.    No friction rub. No gallop.     Comments: Patient with intermittent tachycardia with normal rhythm on my exam Pulmonary:     Effort: Pulmonary effort is normal.     Breath sounds: Normal breath sounds.     Comments: Shallow respirations without respiratory distress, no wheezing, rhonchi, stridor, rales, focal consolidation noted Abdominal:     General: Bowel sounds are normal.     Palpations: Abdomen is soft.  Skin:    General: Skin is warm and dry.     Capillary Refill: Capillary refill takes less than 2 seconds.  Neurological:     Mental Status: He is alert and oriented to person, place, and time.  Psychiatric:        Mood and Affect: Mood normal.        Behavior: Behavior normal.     ED Results / Procedures /  Treatments   Labs (all labs ordered are listed, but only abnormal results are displayed) Labs Reviewed  BASIC METABOLIC PANEL - Abnormal; Notable for the following components:      Result Value   Sodium 132 (*)    CO2 19 (*)    Glucose, Bld 100 (*)    BUN 28 (*)    All other components within normal limits  CBC WITH DIFFERENTIAL/PLATELET - Abnormal; Notable for the following components:   RBC 3.92 (*)    Hemoglobin 9.7 (*)    HCT 30.3 (*)    MCV  77.3 (*)    MCH 24.7 (*)    RDW 21.8 (*)    All other components within normal limits  HEPATIC FUNCTION PANEL - Abnormal; Notable for the following components:   Albumin 3.3 (*)    All other components within normal limits  LACTIC ACID, PLASMA  TROPONIN I (HIGH SENSITIVITY)  TROPONIN I (HIGH SENSITIVITY)    EKG None  Radiology CT Angio Chest PE W and/or Wo Contrast  Result Date: 12/15/2021 CLINICAL DATA:  Palpitation short of breath esophageal cancer EXAM: CT ANGIOGRAPHY CHEST WITH CONTRAST TECHNIQUE: Multidetector CT imaging of the chest was performed using the standard protocol during bolus administration of intravenous contrast. Multiplanar CT image reconstructions and MIPs were obtained to evaluate the vascular anatomy. RADIATION DOSE REDUCTION: This exam was performed according to the departmental dose-optimization program which includes automated exposure control, adjustment of the mA and/or kV according to patient size and/or use of iterative reconstruction technique. CONTRAST:  3m OMNIPAQUE IOHEXOL 350 MG/ML SOLN COMPARISON:  PET CT 09/22/2021 FINDINGS: Cardiovascular: Satisfactory opacification of the pulmonary arteries to the segmental level. No evidence of pulmonary embolism. Normal heart size. No pericardial effusion. Left-sided central venous port with tip at the cavoatrial region. Nonaneurysmal aorta. Mild atherosclerosis. Mediastinum/Nodes: Midline trachea. No thyroid mass. No increasing adenopathy. Decreased esophageal  thickening compared to prior. Lungs/Pleura: No pleural effusion or pneumothorax. Interim finding of spiculated consolidation within the medial right lung base with cavitation and fluid level, series 6, image 88. This measures 2.5 by 2.3 by 3.1 cm. The previously noted left juxta hilar left upper lobe metastatic nodule has resolved. Upper Abdomen: Reflux of contrast into the IVC and right hepatic vein. Musculoskeletal: No acute osseous abnormality. Review of the MIP images confirms the above findings. IMPRESSION: 1. Negative for acute pulmonary embolism. 2. Decreased left upper lobe juxtahilar nodule compared to prior exam. Interim development of spiculated area of consolidation in the medial right lung base with cavitation and fluid level, findings could be infectious or neoplastic in etiology. 3. Decreased esophageal thickening compared to the prior CT. Aortic Atherosclerosis (ICD10-I70.0). Electronically Signed   By: KDonavan FoilM.D.   On: 12/15/2021 18:16   DG Chest Port 1 View  Result Date: 12/15/2021 CLINICAL DATA:  Provided history: Palpitations. Patient currently being treated for esophageal cancer. EXAM: PORTABLE CHEST 1 VIEW COMPARISON:  Chest radiograph 10/12/2021.  Head CT 09/22/2021. FINDINGS: Left chest infusion port catheter with tip projecting at the level of the mid SVC. Heart size within normal limits. No appreciable airspace consolidation or pulmonary edema. No evidence of pleural effusion or pneumothorax. No acute bony abnormality identified. Redemonstrated chronic bilateral rib fracture deformities. IMPRESSION: No evidence of acute cardiopulmonary abnormality. Electronically Signed   By: KKellie SimmeringD.O.   On: 12/15/2021 14:24    Procedures Procedures    Medications Ordered in ED Medications  lactated ringers bolus 1,000 mL (0 mLs Intravenous Stopped 12/15/21 1728)  iohexol (OMNIPAQUE) 350 MG/ML injection 75 mL (75 mLs Intravenous Contrast Given 12/15/21 1726)    ED Course/  Medical Decision Making/ A&P                           Medical Decision Making Amount and/or Complexity of Data Reviewed Labs: ordered. Radiology: ordered.  Risk Prescription drug management.   This patient is a 52y.o. male who presents to the ED for concern of shortness of breath, palpitations, weakness, this involves an extensive number of treatment options,  and is a complaint that carries with it a high risk of complications and morbidity. The emergent differential diagnosis prior to evaluation includes, but is not limited to, dehydration, hyponatremia, hypokalemia, new metastases to lungs, poor response to chemotherapy, heart arrhythmia, ACS, PE, pneumonia, acute bronchitis, upper respiratory infection, general protein calorie malnutrition versus other.   This is not an exhaustive differential.   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  Additional history: Chart reviewed. Pertinent results include: Reviewed recent lab work, imaging from previous emergency department visits, and outpatient oncology visits  Physical Exam: Physical exam performed. The pertinent findings include: Patient is chronically ill-appearing, thin, he is tachycardic on arrival, clinically he appears dehydrated with dry mucous membranes, otherwise he has normal heart rhythm, clear breath sounds bilaterally with no significant focal consolidation noted.  He is not in respiratory distress.  Stable vital signs other than occasional mild hypertension, he has had stable oxygen saturation and normal respirations throughout.  Lab Tests: I ordered, and personally interpreted labs.  The pertinent results include: Hepatic function panel [place] for albumin of 3.3 suggestive of protein calorie malnutrition, BMP is notable for hyponatremia, sodium 132, we will replete with LR bolus, he has mild  bicarb deficit with CO2 19, minimally elevated BUN which are suggestive of generalized dehydration.  CBC with moderate anemia hemoglobin 9.7, not significantly changed from baseline, and seems reasonable in context of recent chemotherapy.  Troponin negative x2, lactic acid normal x1.   Imaging Studies: I ordered imaging studies including plain film chest x-ray w, CT angio PE study .. I independently visualized and interpreted imaging chest xray shows shows no significant intrathoracic abnormality, CT shows left-sided lung lesion is slightly smaller than previous, patient with new spiculated right-sided lung lesion suspicious for possible infection versus new malignancy. I agree with the radiologist interpretation   Cardiac Monitoring:  The patient was maintained on a cardiac monitor.  My attending physician Dr. Roderic Palau viewed and interpreted the cardiac monitored which showed an underlying rhythm of: Normal sinus rhythm, T waves are somewhat more peaked compared to previous, however patient without acute hyperkalemia, no other signs of ischemic changes, patient is not having chest pain. I agree with this interpretation.   Medications: I ordered medication including LR bolus for presumed dehydration. Reevaluation of the patient after these medicines showed that the patient improved. I have reviewed the patients home medicines and have made adjustments as needed.  Clinically especially with spiculated nature of lung mass I am more suspicious of new metastasis versus primary lung cancer, however as differential is broad for possible infectious etiology and patient is feeling somewhat short of breath I feel be reasonable to treat with azithromycin, consider Augmentin but do not want to give patient worse stomach upset, diarrhea especially in context of his ongoing protein calorie malnutrition.   Disposition: After consideration of the diagnostic results and the patients response to treatment, I feel that  patient would benefit from continued oral rehydration, azithromycin as discussed above, and close oncology follow-up.  He otherwise appears stable for discharge at this time, considered admission especially given his multiple comorbidities and hyponatremia today, however he is feeling significantly improved after fluid bolus and would like to go home, I think that this is reasonable.   emergency department workup does not suggest an emergent condition requiring admission or immediate intervention beyond what has been performed at this time. The plan  is: As above. The patient is safe for discharge and has been instructed to return immediately for worsening symptoms, change in symptoms or any other concerns.  I discussed this case with my attending physician Dr. Regenia Skeeter who cosigned this note including patient's presenting symptoms, physical exam, and planned diagnostics and interventions. Attending physician stated agreement with plan or made changes to plan which were implemented.    Final Clinical Impression(s) / ED Diagnoses Final diagnoses:  Palpitations  Shortness of breath  Dehydration  Lung nodule seen on imaging study  Hyponatremia    Rx / DC Orders ED Discharge Orders          Ordered    azithromycin (ZITHROMAX) 250 MG tablet  Daily        12/15/21 1937              Dorien Chihuahua 12/15/21 Jory Sims, MD 12/16/21 [phone]

## 2021-12-15 NOTE — ED Triage Notes (Addendum)
Pt presents with palpitations that started on yesterday, increased today while working, currently being treated esophagus cancer.

## 2021-12-16 ENCOUNTER — Other Ambulatory Visit (HOSPITAL_COMMUNITY): Payer: Self-pay | Admitting: Hematology

## 2021-12-19 ENCOUNTER — Encounter (HOSPITAL_COMMUNITY): Payer: Self-pay | Admitting: Hematology

## 2021-12-19 ENCOUNTER — Encounter: Payer: Self-pay | Admitting: Hematology

## 2021-12-19 ENCOUNTER — Inpatient Hospital Stay: Payer: 59 | Admitting: Dietician

## 2021-12-19 NOTE — Progress Notes (Signed)
Nutrition Follow-up:  Patient with stage IV esophageal cancer. He is receiving FOLFOX + Nivolumab q14d (started 7/5). S/p open G-tube 6/21.  Noted pt seen in ED on 8/24  Patient reports he was unsure of nutrition appointment time. Follow-up completed via telephone. Patient reports he has been following written bolus schedule over the past week. (Pt was provided this on initial teaching as well as multiple copies at subsequent visits). Patient says following regimen as written is working well for him. He reports now tolerating Dillard [employer] formula and would like to continue using it. Patient says diarrhea has resolved. His stool is more formed, recalls 2-3 BM/day. He is drinking apple juice, water, pomegranate juice, and orange juice by mouth. Patient endorses episodes of vomiting after orange juice. He is asking if he can drink Pedialyte. Patient continues to work, says he hits a wall around 1 PM. Patient unable to complete full work day at times. He expresses worry about getting fired.     Medications: reviewed   Labs: 8/24 - Na 132, Glucose 100, BUN 28   Anthropometrics: Last weight 105 lb 6.1 oz on 8/24   8/16 - 105 lb 6.4 oz 8/07 - 110 lb 1.9 oz 7/29 - 121 lb 12.8 oz  Estimated Energy Needs  Kcals: 1577-1720 Protein: 77-96 Fluid: >1.5 L  NUTRITION DIAGNOSIS: Severe malnutrition continues    INTERVENTION:  Educated pt to continue following written bolus regimen  Explained formula is not covered by insurance - pt will have to pay out of pocket for [employer] once he is out Patient has 11 cases Nutren 1.5 that was provided to him. He is agreeable to using this once finished with Dillard [employer] Instructed pt to discontinue drinking orange juice, he is not tolerating  Oral intake of liquids as tolerated  Support and encouragement provided   5 cartons Nutren 1.5 - give one 250 ml carton q3h (7AM-7PM) provides 1875 kcal, 85 g protein, 955 ml free water from formula (1555 ml total water  with 120 ml flush 5x/day)     MONITORING, EVALUATION, GOAL: weight trends, tube feeding   NEXT VISIT: will f/u via telephone next week

## 2021-12-20 ENCOUNTER — Other Ambulatory Visit: Payer: Self-pay | Admitting: Family Medicine

## 2021-12-21 ENCOUNTER — Inpatient Hospital Stay: Payer: 59

## 2021-12-21 ENCOUNTER — Ambulatory Visit: Payer: 59

## 2021-12-21 ENCOUNTER — Ambulatory Visit: Payer: 59 | Admitting: Hematology

## 2021-12-21 ENCOUNTER — Telehealth: Payer: Self-pay | Admitting: Hematology

## 2021-12-21 ENCOUNTER — Other Ambulatory Visit: Payer: 59

## 2021-12-21 VITALS — BP 122/79 | HR 61 | Temp 97.6°F | Resp 18

## 2021-12-21 DIAGNOSIS — Z79899 Other long term (current) drug therapy: Secondary | ICD-10-CM | POA: Diagnosis not present

## 2021-12-21 DIAGNOSIS — Z5111 Encounter for antineoplastic chemotherapy: Secondary | ICD-10-CM | POA: Diagnosis not present

## 2021-12-21 DIAGNOSIS — D509 Iron deficiency anemia, unspecified: Secondary | ICD-10-CM | POA: Diagnosis not present

## 2021-12-21 DIAGNOSIS — I10 Essential (primary) hypertension: Secondary | ICD-10-CM | POA: Diagnosis not present

## 2021-12-21 DIAGNOSIS — C155 Malignant neoplasm of lower third of esophagus: Secondary | ICD-10-CM | POA: Diagnosis not present

## 2021-12-21 DIAGNOSIS — C49A1 Gastrointestinal stromal tumor of esophagus: Secondary | ICD-10-CM | POA: Diagnosis not present

## 2021-12-21 DIAGNOSIS — C159 Malignant neoplasm of esophagus, unspecified: Secondary | ICD-10-CM

## 2021-12-21 DIAGNOSIS — Z8042 Family history of malignant neoplasm of prostate: Secondary | ICD-10-CM | POA: Diagnosis not present

## 2021-12-21 DIAGNOSIS — R911 Solitary pulmonary nodule: Secondary | ICD-10-CM | POA: Diagnosis not present

## 2021-12-21 DIAGNOSIS — E876 Hypokalemia: Secondary | ICD-10-CM | POA: Diagnosis not present

## 2021-12-21 DIAGNOSIS — Z800 Family history of malignant neoplasm of digestive organs: Secondary | ICD-10-CM | POA: Diagnosis not present

## 2021-12-21 DIAGNOSIS — E119 Type 2 diabetes mellitus without complications: Secondary | ICD-10-CM | POA: Diagnosis not present

## 2021-12-21 DIAGNOSIS — Z5112 Encounter for antineoplastic immunotherapy: Secondary | ICD-10-CM | POA: Diagnosis not present

## 2021-12-21 DIAGNOSIS — R69 Illness, unspecified: Secondary | ICD-10-CM | POA: Diagnosis not present

## 2021-12-21 LAB — COMPREHENSIVE METABOLIC PANEL
ALT: 13 U/L (ref 0–44)
AST: 19 U/L (ref 15–41)
Albumin: 3.3 g/dL — ABNORMAL LOW (ref 3.5–5.0)
Alkaline Phosphatase: 65 U/L (ref 38–126)
Anion gap: 9 (ref 5–15)
BUN: 23 mg/dL — ABNORMAL HIGH (ref 6–20)
CO2: 19 mmol/L — ABNORMAL LOW (ref 22–32)
Calcium: 9 mg/dL (ref 8.9–10.3)
Chloride: 105 mmol/L (ref 98–111)
Creatinine, Ser: 1.09 mg/dL (ref 0.61–1.24)
GFR, Estimated: >60 mL/min (ref >60)
Glucose, Bld: 92 mg/dL (ref 70–99)
Potassium: 4.3 mmol/L (ref 3.5–5.1)
Sodium: 133 mmol/L — ABNORMAL LOW (ref 135–145)
Total Bilirubin: 0.8 mg/dL (ref 0.3–1.2)
Total Protein: 7 g/dL (ref 6.5–8.1)

## 2021-12-21 LAB — CBC WITH DIFFERENTIAL/PLATELET
Abs Immature Granulocytes: 0.01 10*3/uL (ref 0.00–0.07)
Basophils Absolute: 0 10*3/uL (ref 0.0–0.1)
Basophils Relative: 1 %
Eosinophils Absolute: 0 10*3/uL (ref 0.0–0.5)
Eosinophils Relative: 1 %
HCT: 32.1 % — ABNORMAL LOW (ref 39.0–52.0)
Hemoglobin: 10.5 g/dL — ABNORMAL LOW (ref 13.0–17.0)
Immature Granulocytes: 0 %
Lymphocytes Relative: 38 %
Lymphs Abs: 2.4 10*3/uL (ref 0.7–4.0)
MCH: 25.4 pg — ABNORMAL LOW (ref 26.0–34.0)
MCHC: 32.7 g/dL (ref 30.0–36.0)
MCV: 77.7 fL — ABNORMAL LOW (ref 80.0–100.0)
Monocytes Absolute: 0.8 10*3/uL (ref 0.1–1.0)
Monocytes Relative: 13 %
Neutro Abs: 3 10*3/uL (ref 1.7–7.7)
Neutrophils Relative %: 47 %
Platelets: 179 10*3/uL (ref 150–400)
RBC: 4.13 MIL/uL — ABNORMAL LOW (ref 4.22–5.81)
RDW: 22.5 % — ABNORMAL HIGH (ref 11.5–15.5)
WBC: 6.2 10*3/uL (ref 4.0–10.5)
nRBC: 0 % (ref 0.0–0.2)

## 2021-12-21 LAB — TSH: TSH: 1.831 u[IU]/mL (ref 0.350–4.500)

## 2021-12-21 LAB — MAGNESIUM: Magnesium: 2.2 mg/dL (ref 1.7–2.4)

## 2021-12-21 MED ORDER — SODIUM CHLORIDE 0.9 % IV SOLN
2400.0000 mg/m2 | INTRAVENOUS | Status: DC
  Administered 2021-12-21: 3800 mg via INTRAVENOUS
  Filled 2021-12-21: qty 76

## 2021-12-21 MED ORDER — SODIUM CHLORIDE 0.9 % IV SOLN
240.0000 mg | Freq: Once | INTRAVENOUS | Status: AC
  Administered 2021-12-21: 240 mg via INTRAVENOUS
  Filled 2021-12-21: qty 24

## 2021-12-21 MED ORDER — SODIUM CHLORIDE 0.9 % IV SOLN: INTRAVENOUS | Status: DC

## 2021-12-21 MED ORDER — FLUOROURACIL CHEMO INJECTION 2.5 GM/50ML
400.0000 mg/m2 | Freq: Once | INTRAVENOUS | Status: AC
  Administered 2021-12-21: 650 mg via INTRAVENOUS
  Filled 2021-12-21: qty 13

## 2021-12-21 MED ORDER — SODIUM CHLORIDE 0.9 % IV SOLN
10.0000 mg | Freq: Once | INTRAVENOUS | Status: AC
  Administered 2021-12-21: 10 mg via INTRAVENOUS
  Filled 2021-12-21: qty 10

## 2021-12-21 MED ORDER — PALONOSETRON HCL INJECTION 0.25 MG/5ML
0.2500 mg | Freq: Once | INTRAVENOUS | Status: AC
  Administered 2021-12-21: 0.25 mg via INTRAVENOUS
  Filled 2021-12-21: qty 5

## 2021-12-21 MED ORDER — DEXTROSE 5 % IV SOLN: Freq: Once | INTRAVENOUS | Status: AC

## 2021-12-21 MED ORDER — OXALIPLATIN CHEMO INJECTION 100 MG/20ML
85.0000 mg/m2 | Freq: Once | INTRAVENOUS | Status: AC
  Administered 2021-12-21: 135 mg via INTRAVENOUS
  Filled 2021-12-21: qty 20

## 2021-12-21 MED ORDER — LEUCOVORIN CALCIUM INJECTION 350 MG
400.0000 mg/m2 | Freq: Once | INTRAVENOUS | Status: AC
  Administered 2021-12-21: 632 mg via INTRAVENOUS
  Filled 2021-12-21: qty 31.6

## 2021-12-21 NOTE — Progress Notes (Signed)
Patient presents today for chemotherapy infusion.  Patient is in stable condition with new complaints. He was seen in the ED on 12/15/21.  Hyponatremia was discovered in the ED.  He is still c/o increased SOB and a new pain in R mid back area that he rates 9/10.  MD made aware.  Hyponatremia has improved on today's labs. MD will send in pain medication script for him to take at home.  All labs are within treatment parameters.  We will proceed with treatment per MD orders.    Patient tolerated treatment well with no complaints voiced.  Home infusion 5FU pump connected.  Patient left ambulatory in stable condition.  Vital signs stable at discharge.  Follow up as scheduled.

## 2021-12-21 NOTE — Telephone Encounter (Signed)
Received a call back from [place] regarding the coverage for [employer]. She confirmed that the supplement is not covered by his plan

## 2021-12-21 NOTE — Patient Instructions (Signed)
Trevor [name]  Discharge Instructions: Thank you for choosing [facility] to provide your oncology and hematology care.  If you have a lab appointment with the [facility], please come in thru the Main Entrance and check in at the main information desk.  Wear comfortable clothing and clothing appropriate for easy access to any Portacath or PICC line.   We strive to give you quality time with your provider. You may need to reschedule your appointment if you arrive late (15 or more minutes).  Arriving late affects you and other patients whose appointments are after yours.  Also, if you miss three or more appointments without notifying the office, you may be dismissed from the clinic at the provider's discretion.      For prescription refill requests, have your pharmacy contact our office and allow 72 hours for refills to be completed.    Today you received the following chemotherapy and/or immunotherapy agents Opdivo/FOLFOX.  Oxaliplatin Injection What is this medication? OXALIPLATIN (ox AL i PLA tin) treats some types of cancer. It works by slowing down the growth of cancer cells. This medicine may be used for other purposes; ask your health care provider or pharmacist if you have questions. COMMON BRAND NAME(S): Eloxatin What should I tell my care team before I take this medication? They need to know if you have any of these conditions: Heart disease History of irregular heartbeat or rhythm Liver disease Low blood cell levels (white cells, red cells, and platelets) Lung or breathing disease, such as asthma Take medications that treat or prevent blood clots Tingling of the fingers, toes, or other nerve disorder An unusual or allergic reaction to oxaliplatin, other medications, foods, dyes, or preservatives If you or your partner are pregnant or trying to get pregnant Breast-feeding How should I use this medication? This medication is injected into a  vein. It is given by your care team in a hospital or clinic setting. Talk to your care team about the use of this medication in children. Special care may be needed. Overdosage: If you think you have taken too much of this medicine contact a poison control center or emergency room at once. NOTE: This medicine is only for you. Do not share this medicine with others. What if I miss a dose? Keep appointments for follow-up doses. It is important not to miss a dose. Call your care team if you are unable to keep an appointment. What may interact with this medication? Do not take this medication with any of the following: Cisapride Dronedarone Pimozide Thioridazine This medication may also interact with the following: Aspirin and aspirin-like medications Certain medications that treat or prevent blood clots, such as warfarin, apixaban, dabigatran, and rivaroxaban Cisplatin Cyclosporine Diuretics Medications for infection, such as acyclovir, adefovir, amphotericin B, bacitracin, cidofovir, foscarnet, ganciclovir, gentamicin, pentamidine, vancomycin NSAIDs, medications for pain and inflammation, such as ibuprofen or naproxen Other medications that cause heart rhythm changes Pamidronate Zoledronic acid This list may not describe all possible interactions. Give your health care provider a list of all the medicines, herbs, non-prescription drugs, or dietary supplements you use. Also tell them if you smoke, drink alcohol, or use illegal drugs. Some items may interact with your medicine. What should I watch for while using this medication? Your condition will be monitored carefully while you are receiving this medication. You may need blood work while taking this medication. This medication may make you feel generally unwell. This is not uncommon as chemotherapy can  affect healthy cells as well as cancer cells. Report any side effects. Continue your course of treatment even though you feel ill unless  your care team tells you to stop. This medication may increase your risk of getting an infection. Call your care team for advice if you get a fever, chills, sore throat, or other symptoms of a cold or flu. Do not treat yourself. Try to avoid being around people who are sick. Avoid taking medications that contain aspirin, acetaminophen, ibuprofen, naproxen, or ketoprofen unless instructed by your care team. These medications may hide a fever. Be careful brushing or flossing your teeth or using a toothpick because you may get an infection or bleed more easily. If you have any dental work done, tell your dentist you are receiving this medication. This medication can make you more sensitive to cold. Do not drink cold drinks or use ice. Cover exposed skin before coming in contact with cold temperatures or cold objects. When out in cold weather wear warm clothing and cover your mouth and nose to warm the air that goes into your lungs. Tell your care team if you get sensitive to the cold. Talk to your care team if you or your partner are pregnant or think either of you might be pregnant. This medication can cause serious birth defects if taken during pregnancy and for 9 months after the last dose. A negative pregnancy test is required before starting this medication. A reliable form of contraception is recommended while taking this medication and for 9 months after the last dose. Talk to your care team about effective forms of contraception. Do not father a child while taking this medication and for 6 months after the last dose. Use a condom while having sex during this time period. Do not breastfeed while taking this medication and for 3 months after the last dose. This medication may cause infertility. Talk to your care team if you are concerned about your fertility. What side effects may I notice from receiving this medication? Side effects that you should report to your care team as soon as possible: Allergic  reactions--skin rash, itching, hives, swelling of the face, lips, tongue, or throat Bleeding--bloody or black, tar-like stools, vomiting blood or Trevor [name] material that looks like coffee grounds, red or dark Trevor [name] urine, small red or purple spots on skin, unusual bruising or bleeding Dry cough, shortness of breath or trouble breathing Heart rhythm changes--fast or irregular heartbeat, dizziness, feeling faint or lightheaded, chest pain, trouble breathing Infection--fever, chills, cough, sore throat, wounds that don't heal, pain or trouble when passing urine, general feeling of discomfort or being unwell Liver injury--right upper belly pain, loss of appetite, nausea, light-colored stool, dark yellow or Trevor [name] urine, yellowing skin or eyes, unusual weakness or fatigue Low red blood cell level--unusual weakness or fatigue, dizziness, headache, trouble breathing Muscle injury--unusual weakness or fatigue, muscle pain, dark yellow or Trevor [name] urine, decrease in amount of urine Pain, tingling, or numbness in the hands or feet Sudden and severe headache, confusion, change in vision, seizures, which may be signs of posterior reversible encephalopathy syndrome (PRES) Unusual bruising or bleeding Side effects that usually do not require medical attention (report to your care team if they continue or are bothersome): Diarrhea Nausea Pain, redness, or swelling with sores inside the mouth or throat Unusual weakness or fatigue Vomiting This list may not describe all possible side effects. Call your doctor for medical advice about side effects. You may report side effects to FDA at 1-800-FDA-1088.  Where should I keep my medication? This medication is given in a hospital or clinic. It will not be stored at home. NOTE: This sheet is a summary. It may not cover all possible information. If you have questions about this medicine, talk to your doctor, pharmacist, or health care provider.  2023 Elsevier/Gold Standard  (2021-08-05 00:00:00)   Leucovorin Injection What is this medication? LEUCOVORIN (loo koe VOR in) prevents side effects from certain medications, such as methotrexate. It works by increasing folate levels. This helps protect healthy cells in your body. It may also be used to treat anemia caused by low levels of folate. It can also be used with fluorouracil, a type of chemotherapy, to treat colorectal cancer. It works by increasing the effects of fluorouracil in the body. This medicine may be used for other purposes; ask your health care provider or pharmacist if you have questions. What should I tell my care team before I take this medication? They need to know if you have any of these conditions: Anemia from low levels of vitamin B12 in the blood An unusual or allergic reaction to leucovorin, folic acid, other medications, foods, dyes, or preservatives Pregnant or trying to get pregnant Breastfeeding How should I use this medication? This medication is injected into a vein or a muscle. It is given by your care team in a hospital or clinic setting. Talk to your care team about the use of this medication in children. Special care may be needed. Overdosage: If you think you have taken too much of this medicine contact a poison control center or emergency room at once. NOTE: This medicine is only for you. Do not share this medicine with others. What if I miss a dose? Keep appointments for follow-up doses. It is important not to miss your dose. Call your care team if you are unable to keep an appointment. What may interact with this medication? Capecitabine Fluorouracil Phenobarbital Phenytoin Primidone Trimethoprim;sulfamethoxazole This list may not describe all possible interactions. Give your health care provider a list of all the medicines, herbs, non-prescription drugs, or dietary supplements you use. Also tell them if you smoke, drink alcohol, or use illegal drugs. Some items may  interact with your medicine. What should I watch for while using this medication? Your condition will be monitored carefully while you are receiving this medication. This medication may increase the side effects of 5-fluorouracil. Tell your care team if you have diarrhea or mouth sores that do not get better or that get worse. What side effects may I notice from receiving this medication? Side effects that you should report to your care team as soon as possible: Allergic reactions--skin rash, itching, hives, swelling of the face, lips, tongue, or throat This list may not describe all possible side effects. Call your doctor for medical advice about side effects. You may report side effects to FDA at 1-800-FDA-1088. Where should I keep my medication? This medication is given in a hospital or clinic. It will not be stored at home. NOTE: This sheet is a summary. It may not cover all possible information. If you have questions about this medicine, talk to your doctor, pharmacist, or health care provider.  2023 Elsevier/Gold Standard (2021-08-19 00:00:00)   Fluorouracil Injection What is this medication? FLUOROURACIL (flure oh YOOR a sil) treats some types of cancer. It works by slowing down the growth of cancer cells. This medicine may be used for other purposes; ask your health care provider or pharmacist if you have questions.  COMMON BRAND NAME(S): Adrucil What should I tell my care team before I take this medication? They need to know if you have any of these conditions: Blood disorders Dihydropyrimidine dehydrogenase (DPD) deficiency Infection, such as chickenpox, cold sores, herpes Kidney disease Liver disease Poor nutrition Recent or ongoing radiation therapy An unusual or allergic reaction to fluorouracil, other medications, foods, dyes, or preservatives If you or your partner are pregnant or trying to get pregnant Breast-feeding How should I use this medication? This medication is  injected into a vein. It is administered by your care team in a hospital or clinic setting. Talk to your care team about the use of this medication in children. Special care may be needed. Overdosage: If you think you have taken too much of this medicine contact a poison control center or emergency room at once. NOTE: This medicine is only for you. Do not share this medicine with others. What if I miss a dose? Keep appointments for follow-up doses. It is important not to miss your dose. Call your care team if you are unable to keep an appointment. What may interact with this medication? Do not take this medication with any of the following: Live virus vaccines This medication may also interact with the following: Medications that treat or prevent blood clots, such as warfarin, enoxaparin, dalteparin This list may not describe all possible interactions. Give your health care provider a list of all the medicines, herbs, non-prescription drugs, or dietary supplements you use. Also tell them if you smoke, drink alcohol, or use illegal drugs. Some items may interact with your medicine. What should I watch for while using this medication? Your condition will be monitored carefully while you are receiving this medication. This medication may make you feel generally unwell. This is not uncommon as chemotherapy can affect healthy cells as well as cancer cells. Report any side effects. Continue your course of treatment even though you feel ill unless your care team tells you to stop. In some cases, you may be given additional medications to help with side effects. Follow all directions for their use. This medication may increase your risk of getting an infection. Call your care team for advice if you get a fever, chills, sore throat, or other symptoms of a cold or flu. Do not treat yourself. Try to avoid being around people who are sick. This medication may increase your risk to bruise or bleed. Call your  care team if you notice any unusual bleeding. Be careful brushing or flossing your teeth or using a toothpick because you may get an infection or bleed more easily. If you have any dental work done, tell your dentist you are receiving this medication. Avoid taking medications that contain aspirin, acetaminophen, ibuprofen, naproxen, or ketoprofen unless instructed by your care team. These medications may hide a fever. Do not treat diarrhea with over the counter products. Contact your care team if you have diarrhea that lasts more than 2 days or if it is severe and watery. This medication can make you more sensitive to the sun. Keep out of the sun. If you cannot avoid being in the sun, wear protective clothing and sunscreen. Do not use sun lamps, tanning beds, or tanning booths. Talk to your care team if you or your partner wish to become pregnant or think you might be pregnant. This medication can cause serious birth defects if taken during pregnancy and for 3 months after the last dose. A reliable form of contraception is recommended while  taking this medication and for 3 months after the last dose. Talk to your care team about effective forms of contraception. Do not father a child while taking this medication and for 3 months after the last dose. Use a condom while having sex during this time period. Do not breastfeed while taking this medication. This medication may cause infertility. Talk to your care team if you are concerned about your fertility. What side effects may I notice from receiving this medication? Side effects that you should report to your care team as soon as possible: Allergic reactions--skin rash, itching, hives, swelling of the face, lips, tongue, or throat Heart attack--pain or tightness in the chest, shoulders, arms, or jaw, nausea, shortness of breath, cold or clammy skin, feeling faint or lightheaded Heart failure--shortness of breath, swelling of the ankles, feet, or hands,  sudden weight gain, unusual weakness or fatigue Heart rhythm changes--fast or irregular heartbeat, dizziness, feeling faint or lightheaded, chest pain, trouble breathing High ammonia level--unusual weakness or fatigue, confusion, loss of appetite, nausea, vomiting, seizures Infection--fever, chills, cough, sore throat, wounds that don't heal, pain or trouble when passing urine, general feeling of discomfort or being unwell Low red blood cell level--unusual weakness or fatigue, dizziness, headache, trouble breathing Pain, tingling, or numbness in the hands or feet, muscle weakness, change in vision, confusion or trouble speaking, loss of balance or coordination, trouble walking, seizures Redness, swelling, and blistering of the skin over hands and feet Severe or prolonged diarrhea Unusual bruising or bleeding Side effects that usually do not require medical attention (report to your care team if they continue or are bothersome): Dry skin Headache Increased tears Nausea Pain, redness, or swelling with sores inside the mouth or throat Sensitivity to light Vomiting This list may not describe all possible side effects. Call your doctor for medical advice about side effects. You may report side effects to FDA at 1-800-FDA-1088. Where should I keep my medication? This medication is given in a hospital or clinic. It will not be stored at home. NOTE: This sheet is a summary. It may not cover all possible information. If you have questions about this medicine, talk to your doctor, pharmacist, or health care provider.  2023 Elsevier/Gold Standard (2021-08-16 00:00:00)   Nivolumab Injection What is this medication? NIVOLUMAB (nye VOL ue mab) treats some types of cancer. It works by helping your immune system slow or stop the spread of cancer cells. It is a monoclonal antibody. This medicine may be used for other purposes; ask your health care provider or pharmacist if you have questions. COMMON  BRAND NAME(S): Opdivo What should I tell my care team before I take this medication? They need to know if you have any of these conditions: Allogeneic stem cell transplant (uses someone else's stem cells) Autoimmune diseases, such as Crohn disease, ulcerative colitis, lupus History of chest radiation Nervous system problems, such as Guillain-Barre syndrome or myasthenia gravis Organ transplant An unusual or allergic reaction to nivolumab, other medications, foods, dyes, or preservatives Pregnant or trying to get pregnant Breast-feeding How should I use this medication? This medication is infused into a vein. It is given in a hospital or clinic setting. A special MedGuide will be given to you before each treatment. Be sure to read this information carefully each time. Talk to your care team about the use of this medication in children. While it may be prescribed for children as young as 12 years for selected conditions, precautions do apply. Overdosage: If you think you  have taken too much of this medicine contact a poison control center or emergency room at once. NOTE: This medicine is only for you. Do not share this medicine with others. What if I miss a dose? Keep appointments for follow-up doses. It is important not to miss your dose. Call your care team if you are unable to keep an appointment. What may interact with this medication? Interactions have not been studied. This list may not describe all possible interactions. Give your health care provider a list of all the medicines, herbs, non-prescription drugs, or dietary supplements you use. Also tell them if you smoke, drink alcohol, or use illegal drugs. Some items may interact with your medicine. What should I watch for while using this medication? Your condition will be monitored carefully while you are receiving this medication. You may need blood work while taking this medication. This medication may cause serious skin reactions.  They can happen weeks to months after starting the medication. Contact your care team right away if you notice fevers or flu-like symptoms with a rash. The rash may be red or purple and then turn into blisters or peeling of the skin. You may also notice a red rash with swelling of the face, lips, or lymph nodes in your neck or under your arms. Tell your care team right away if you have any change in your eyesight. Talk to your care team if you are pregnant or think you might be pregnant. A negative pregnancy test is required before starting this medication. A reliable form of contraception is recommended while taking this medication and for 5 months after the last dose. Talk to your care team about effective forms of contraception. Do not breast-feed while taking this medication and for 5 months after the last dose. What side effects may I notice from receiving this medication? Side effects that you should report to your care team as soon as possible: Allergic reactions--skin rash, itching, hives, swelling of the face, lips, tongue, or throat Dry cough, shortness of breath or trouble breathing Eye pain, redness, irritation, or discharge with blurry or decreased vision Heart muscle inflammation--unusual weakness or fatigue, shortness of breath, chest pain, fast or irregular heartbeat, dizziness, swelling of the ankles, feet, or hands Hormone gland problems--headache, sensitivity to light, unusual weakness or fatigue, dizziness, fast or irregular heartbeat, increased sensitivity to cold or heat, excessive sweating, constipation, hair loss, increased thirst or amount of urine, tremors or shaking, irritability Infusion reactions--chest pain, shortness of breath or trouble breathing, feeling faint or lightheaded Kidney injury (glomerulonephritis)--decrease in the amount of urine, red or dark Trevor [name] urine, foamy or bubbly urine, swelling of the ankles, hands, or feet Liver injury--right upper belly pain, loss  of appetite, nausea, light-colored stool, dark yellow or Trevor [name] urine, yellowing skin or eyes, unusual weakness or fatigue Pain, tingling, or numbness in the hands or feet, muscle weakness, change in vision, confusion or trouble speaking, loss of balance or coordination, trouble walking, seizures Rash, fever, and swollen lymph nodes Redness, blistering, peeling, or loosening of the skin, including inside the mouth Sudden or severe stomach pain, bloody diarrhea, fever, nausea, vomiting Side effects that usually do not require medical attention (report these to your care team if they continue or are bothersome): Bone, joint, or muscle pain Diarrhea Fatigue Loss of appetite Nausea Skin rash This list may not describe all possible side effects. Call your doctor for medical advice about side effects. You may report side effects to FDA at 1-800-FDA-1088. Where should  I keep my medication? This medication is given in a hospital or clinic. It will not be stored at home. NOTE: This sheet is a summary. It may not cover all possible information. If you have questions about this medicine, talk to your doctor, pharmacist, or health care provider.  2023 Elsevier/Gold Standard (2021-03-11 00:00:00)        To help prevent nausea and vomiting after your treatment, we encourage you to take your nausea medication as directed.  BELOW ARE SYMPTOMS THAT SHOULD BE REPORTED IMMEDIATELY: *FEVER GREATER THAN 100.4 F (38 C) OR HIGHER *CHILLS OR SWEATING *NAUSEA AND VOMITING THAT IS NOT CONTROLLED WITH YOUR NAUSEA MEDICATION *UNUSUAL SHORTNESS OF BREATH *UNUSUAL BRUISING OR BLEEDING *URINARY PROBLEMS (pain or burning when urinating, or frequent urination) *BOWEL PROBLEMS (unusual diarrhea, constipation, pain near the anus) TENDERNESS IN MOUTH AND THROAT WITH OR WITHOUT PRESENCE OF ULCERS (sore throat, sores in mouth, or a toothache) UNUSUAL RASH, SWELLING OR PAIN  UNUSUAL VAGINAL DISCHARGE OR ITCHING   Items  with * indicate a potential emergency and should be followed up as soon as possible or go to the Emergency Department if any problems should occur.  Please show the CHEMOTHERAPY ALERT CARD or IMMUNOTHERAPY ALERT CARD at check-in to the Emergency Department and triage nurse.  Should you have questions after your visit or need to cancel or reschedule your appointment, please contact [facility] [phone]  and follow the prompts.  Office hours are 8:00 a.m. to 4:30 p.m. Monday - Friday. Please note that voicemails left after 4:00 p.m. may not be returned until the following business day.  We are closed weekends and major holidays. You have access to a nurse at all times for urgent questions. Please call the main number to the clinic [phone] and follow the prompts.  For any non-urgent questions, you may also contact your provider using MyChart. We now offer e-Visits for anyone 37 and older to request care online for non-urgent symptoms. For details visit [email or web address].   Also download the MyChart app! Go to the app store, search "MyChart", open the app, select [facility], and log in with your MyChart username and password.  Masks are optional in the cancer centers. If you would like for your care team to wear a mask while they are taking care of you, please let them know. You may have one support person who is at least 53 years old accompany you for your appointments.

## 2021-12-23 ENCOUNTER — Other Ambulatory Visit: Payer: Self-pay

## 2021-12-23 ENCOUNTER — Inpatient Hospital Stay: Payer: 59 | Attending: Hematology

## 2021-12-23 VITALS — BP 121/85 | HR 92 | Temp 96.3°F | Resp 18

## 2021-12-23 DIAGNOSIS — Z79899 Other long term (current) drug therapy: Secondary | ICD-10-CM | POA: Insufficient documentation

## 2021-12-23 DIAGNOSIS — I10 Essential (primary) hypertension: Secondary | ICD-10-CM | POA: Insufficient documentation

## 2021-12-23 DIAGNOSIS — R0609 Other forms of dyspnea: Secondary | ICD-10-CM | POA: Diagnosis not present

## 2021-12-23 DIAGNOSIS — Z5112 Encounter for antineoplastic immunotherapy: Secondary | ICD-10-CM | POA: Insufficient documentation

## 2021-12-23 DIAGNOSIS — R911 Solitary pulmonary nodule: Secondary | ICD-10-CM | POA: Insufficient documentation

## 2021-12-23 DIAGNOSIS — R0602 Shortness of breath: Secondary | ICD-10-CM | POA: Insufficient documentation

## 2021-12-23 DIAGNOSIS — E119 Type 2 diabetes mellitus without complications: Secondary | ICD-10-CM | POA: Insufficient documentation

## 2021-12-23 DIAGNOSIS — Z8042 Family history of malignant neoplasm of prostate: Secondary | ICD-10-CM | POA: Insufficient documentation

## 2021-12-23 DIAGNOSIS — Z800 Family history of malignant neoplasm of digestive organs: Secondary | ICD-10-CM | POA: Insufficient documentation

## 2021-12-23 DIAGNOSIS — F17210 Nicotine dependence, cigarettes, uncomplicated: Secondary | ICD-10-CM | POA: Diagnosis not present

## 2021-12-23 DIAGNOSIS — C155 Malignant neoplasm of lower third of esophagus: Secondary | ICD-10-CM | POA: Diagnosis not present

## 2021-12-23 DIAGNOSIS — R634 Abnormal weight loss: Secondary | ICD-10-CM | POA: Diagnosis not present

## 2021-12-23 DIAGNOSIS — R002 Palpitations: Secondary | ICD-10-CM | POA: Diagnosis not present

## 2021-12-23 DIAGNOSIS — M109 Gout, unspecified: Secondary | ICD-10-CM

## 2021-12-23 DIAGNOSIS — R058 Other specified cough: Secondary | ICD-10-CM | POA: Insufficient documentation

## 2021-12-23 DIAGNOSIS — C159 Malignant neoplasm of esophagus, unspecified: Secondary | ICD-10-CM

## 2021-12-23 DIAGNOSIS — R000 Tachycardia, unspecified: Secondary | ICD-10-CM | POA: Insufficient documentation

## 2021-12-23 DIAGNOSIS — Z5111 Encounter for antineoplastic chemotherapy: Secondary | ICD-10-CM | POA: Insufficient documentation

## 2021-12-23 MED ORDER — SODIUM CHLORIDE 0.9% FLUSH
10.0000 mL | INTRAVENOUS | Status: DC | PRN
  Administered 2021-12-23: 10 mL

## 2021-12-23 MED ORDER — IBUPROFEN 600 MG PO TABS: 600.0000 mg | ORAL_TABLET | Freq: Three times a day (TID) | ORAL | 1 refills | Status: DC | PRN

## 2021-12-23 MED ORDER — HEPARIN SOD (PORK) LOCK FLUSH 100 UNIT/ML IV SOLN
500.0000 [IU] | Freq: Once | INTRAVENOUS | Status: AC | PRN
  Administered 2021-12-23: 500 [IU]

## 2021-12-23 NOTE — Patient Instructions (Signed)
[facility]  Discharge Instructions: Thank you for choosing [facility] to provide your oncology and hematology care.  If you have a lab appointment with the [facility], please come in thru the Main Entrance and check in at the main information desk.  Wear comfortable clothing and clothing appropriate for easy access to any Portacath or PICC line.   We strive to give you quality time with your provider. You may need to reschedule your appointment if you arrive late (15 or more minutes).  Arriving late affects you and other patients whose appointments are after yours.  Also, if you miss three or more appointments without notifying the office, you may be dismissed from the clinic at the provider's discretion.      For prescription refill requests, have your pharmacy contact our office and allow 72 hours for refills to be completed.    Today you received the following chemotherapy and/or immunotherapy agents Adrucil. Fluorouracil Injection What is this medication? FLUOROURACIL (flure oh YOOR a sil) treats some types of cancer. It works by slowing down the growth of cancer cells. This medicine may be used for other purposes; ask your health care provider or pharmacist if you have questions. COMMON BRAND NAME(S): Adrucil What should I tell my care team before I take this medication? They need to know if you have any of these conditions: Blood disorders Dihydropyrimidine dehydrogenase (DPD) deficiency Infection, such as chickenpox, cold sores, herpes Kidney disease Liver disease Poor nutrition Recent or ongoing radiation therapy An unusual or allergic reaction to fluorouracil, other medications, foods, dyes, or preservatives If you or your partner are pregnant or trying to get pregnant Breast-feeding How should I use this medication? This medication is injected into a vein. It is administered by your care team in a hospital or clinic setting. Talk to  your care team about the use of this medication in children. Special care may be needed. Overdosage: If you think you have taken too much of this medicine contact a poison control center or emergency room at once. NOTE: This medicine is only for you. Do not share this medicine with others. What if I miss a dose? Keep appointments for follow-up doses. It is important not to miss your dose. Call your care team if you are unable to keep an appointment. What may interact with this medication? Do not take this medication with any of the following: Live virus vaccines This medication may also interact with the following: Medications that treat or prevent blood clots, such as warfarin, enoxaparin, dalteparin This list may not describe all possible interactions. Give your health care provider a list of all the medicines, herbs, non-prescription drugs, or dietary supplements you use. Also tell them if you smoke, drink alcohol, or use illegal drugs. Some items may interact with your medicine. What should I watch for while using this medication? Your condition will be monitored carefully while you are receiving this medication. This medication may make you feel generally unwell. This is not uncommon as chemotherapy can affect healthy cells as well as cancer cells. Report any side effects. Continue your course of treatment even though you feel ill unless your care team tells you to stop. In some cases, you may be given additional medications to help with side effects. Follow all directions for their use. This medication may increase your risk of getting an infection. Call your care team for advice if you get a fever, chills, sore throat, or other symptoms of  a cold or flu. Do not treat yourself. Try to avoid being around people who are sick. This medication may increase your risk to bruise or bleed. Call your care team if you notice any unusual bleeding. Be careful brushing or flossing your teeth or using a  toothpick because you may get an infection or bleed more easily. If you have any dental work done, tell your dentist you are receiving this medication. Avoid taking medications that contain aspirin, acetaminophen, ibuprofen, naproxen, or ketoprofen unless instructed by your care team. These medications may hide a fever. Do not treat diarrhea with over the counter products. Contact your care team if you have diarrhea that lasts more than 2 days or if it is severe and watery. This medication can make you more sensitive to the sun. Keep out of the sun. If you cannot avoid being in the sun, wear protective clothing and sunscreen. Do not use sun lamps, tanning beds, or tanning booths. Talk to your care team if you or your partner wish to become pregnant or think you might be pregnant. This medication can cause serious birth defects if taken during pregnancy and for 3 months after the last dose. A reliable form of contraception is recommended while taking this medication and for 3 months after the last dose. Talk to your care team about effective forms of contraception. Do not father a child while taking this medication and for 3 months after the last dose. Use a condom while having sex during this time period. Do not breastfeed while taking this medication. This medication may cause infertility. Talk to your care team if you are concerned about your fertility. What side effects may I notice from receiving this medication? Side effects that you should report to your care team as soon as possible: Allergic reactions--skin rash, itching, hives, swelling of the face, lips, tongue, or throat Heart attack--pain or tightness in the chest, shoulders, arms, or jaw, nausea, shortness of breath, cold or clammy skin, feeling faint or lightheaded Heart failure--shortness of breath, swelling of the ankles, feet, or hands, sudden weight gain, unusual weakness or fatigue Heart rhythm changes--fast or irregular heartbeat,  dizziness, feeling faint or lightheaded, chest pain, trouble breathing High ammonia level--unusual weakness or fatigue, confusion, loss of appetite, nausea, vomiting, seizures Infection--fever, chills, cough, sore throat, wounds that don't heal, pain or trouble when passing urine, general feeling of discomfort or being unwell Low red blood cell level--unusual weakness or fatigue, dizziness, headache, trouble breathing Pain, tingling, or numbness in the hands or feet, muscle weakness, change in vision, confusion or trouble speaking, loss of balance or coordination, trouble walking, seizures Redness, swelling, and blistering of the skin over hands and feet Severe or prolonged diarrhea Unusual bruising or bleeding Side effects that usually do not require medical attention (report to your care team if they continue or are bothersome): Dry skin Headache Increased tears Nausea Pain, redness, or swelling with sores inside the mouth or throat Sensitivity to light Vomiting This list may not describe all possible side effects. Call your doctor for medical advice about side effects. You may report side effects to FDA at 1-800-FDA-1088. Where should I keep my medication? This medication is given in a hospital or clinic. It will not be stored at home. NOTE: This sheet is a summary. It may not cover all possible information. If you have questions about this medicine, talk to your doctor, pharmacist, or health care provider.  2023 Elsevier/Gold Standard (2021-08-16 00:00:00)  To help prevent nausea and vomiting after your treatment, we encourage you to take your nausea medication as directed.  BELOW ARE SYMPTOMS THAT SHOULD BE REPORTED IMMEDIATELY: *FEVER GREATER THAN 100.4 F (38 C) OR HIGHER *CHILLS OR SWEATING *NAUSEA AND VOMITING THAT IS NOT CONTROLLED WITH YOUR NAUSEA MEDICATION *UNUSUAL SHORTNESS OF BREATH *UNUSUAL BRUISING OR BLEEDING *URINARY PROBLEMS (pain or burning when urinating, or  frequent urination) *BOWEL PROBLEMS (unusual diarrhea, constipation, pain near the anus) TENDERNESS IN MOUTH AND THROAT WITH OR WITHOUT PRESENCE OF ULCERS (sore throat, sores in mouth, or a toothache) UNUSUAL RASH, SWELLING OR PAIN  UNUSUAL VAGINAL DISCHARGE OR ITCHING   Items with * indicate a potential emergency and should be followed up as soon as possible or go to the Emergency Department if any problems should occur.  Please show the CHEMOTHERAPY ALERT CARD or IMMUNOTHERAPY ALERT CARD at check-in to the Emergency Department and triage nurse.  Should you have questions after your visit or need to cancel or reschedule your appointment, please contact [facility] [phone]  and follow the prompts.  Office hours are 8:00 a.m. to 4:30 p.m. Monday - Friday. Please note that voicemails left after 4:00 p.m. may not be returned until the following business day.  We are closed weekends and major holidays. You have access to a nurse at all times for urgent questions. Please call the main number to the clinic [phone] and follow the prompts.  For any non-urgent questions, you may also contact your provider using MyChart. We now offer e-Visits for anyone 76 and older to request care online for non-urgent symptoms. For details visit [email or web address].   Also download the MyChart app! Go to the app store, search "MyChart", open the app, select [facility], and log in with your MyChart username and password.  Masks are optional in the cancer centers. If you would like for your care team to wear a mask while they are taking care of you, please let them know. You may have one support person who is at least 53 years old accompany you for your appointments.

## 2021-12-23 NOTE — Progress Notes (Signed)
Patient presents today for pump d/c. Vital signs are stable. Port a cath site clean, dry, and intact. Port flushed with 10 mls of Normal Saline and 500 Units of Heparin. Needle removed intact. Band aid applied. Patient has no complaints at this time. Discharged from clinic ambulatory and in stable condition. Patient alert and oriented.  

## 2021-12-25 ENCOUNTER — Other Ambulatory Visit: Payer: Self-pay | Admitting: Hematology

## 2021-12-25 DIAGNOSIS — C159 Malignant neoplasm of esophagus, unspecified: Secondary | ICD-10-CM

## 2021-12-27 ENCOUNTER — Other Ambulatory Visit: Payer: Self-pay | Admitting: *Deleted

## 2021-12-27 DIAGNOSIS — C49A1 Gastrointestinal stromal tumor of esophagus: Secondary | ICD-10-CM | POA: Diagnosis not present

## 2021-12-28 ENCOUNTER — Other Ambulatory Visit: Payer: Self-pay | Admitting: *Deleted

## 2021-12-28 DIAGNOSIS — M109 Gout, unspecified: Secondary | ICD-10-CM

## 2021-12-28 MED ORDER — IBUPROFEN 600 MG PO TABS: 600.0000 mg | ORAL_TABLET | Freq: Three times a day (TID) | ORAL | 1 refills | Status: DC | PRN

## 2022-01-02 ENCOUNTER — Telehealth: Payer: Self-pay | Admitting: Dietician

## 2022-01-02 NOTE — Telephone Encounter (Signed)
Attempted to contact patient via telephone for nutrition follow-up. Left VM with request for return call. Contact information provided.  

## 2022-01-04 ENCOUNTER — Ambulatory Visit (HOSPITAL_COMMUNITY)
Admission: RE | Admit: 2022-01-04 | Discharge: 2022-01-04 | Disposition: A | Discharge status: Home / Self Care | Payer: 59 | Source: Ambulatory Visit | Attending: Physician Assistant | Admitting: Physician Assistant

## 2022-01-04 ENCOUNTER — Encounter: Payer: Self-pay | Admitting: *Deleted

## 2022-01-04 ENCOUNTER — Other Ambulatory Visit: Payer: Self-pay | Admitting: *Deleted

## 2022-01-04 ENCOUNTER — Inpatient Hospital Stay: Payer: 59

## 2022-01-04 ENCOUNTER — Inpatient Hospital Stay (HOSPITAL_BASED_OUTPATIENT_CLINIC_OR_DEPARTMENT_OTHER): Payer: 59 | Admitting: Physician Assistant

## 2022-01-04 ENCOUNTER — Encounter: Payer: Self-pay | Admitting: Hematology

## 2022-01-04 ENCOUNTER — Encounter (HOSPITAL_COMMUNITY): Payer: Self-pay | Admitting: Hematology

## 2022-01-04 VITALS — BP 113/68 | HR 68 | Temp 97.9°F | Resp 18

## 2022-01-04 DIAGNOSIS — R000 Tachycardia, unspecified: Secondary | ICD-10-CM | POA: Diagnosis not present

## 2022-01-04 DIAGNOSIS — C159 Malignant neoplasm of esophagus, unspecified: Secondary | ICD-10-CM

## 2022-01-04 DIAGNOSIS — R911 Solitary pulmonary nodule: Secondary | ICD-10-CM | POA: Diagnosis not present

## 2022-01-04 DIAGNOSIS — R059 Cough, unspecified: Secondary | ICD-10-CM | POA: Insufficient documentation

## 2022-01-04 DIAGNOSIS — R0602 Shortness of breath: Secondary | ICD-10-CM | POA: Insufficient documentation

## 2022-01-04 DIAGNOSIS — Z8042 Family history of malignant neoplasm of prostate: Secondary | ICD-10-CM | POA: Diagnosis not present

## 2022-01-04 DIAGNOSIS — R058 Other specified cough: Secondary | ICD-10-CM | POA: Diagnosis not present

## 2022-01-04 DIAGNOSIS — R0609 Other forms of dyspnea: Secondary | ICD-10-CM | POA: Diagnosis not present

## 2022-01-04 DIAGNOSIS — J189 Pneumonia, unspecified organism: Secondary | ICD-10-CM

## 2022-01-04 DIAGNOSIS — R634 Abnormal weight loss: Secondary | ICD-10-CM | POA: Diagnosis not present

## 2022-01-04 DIAGNOSIS — C155 Malignant neoplasm of lower third of esophagus: Secondary | ICD-10-CM | POA: Diagnosis not present

## 2022-01-04 DIAGNOSIS — E119 Type 2 diabetes mellitus without complications: Secondary | ICD-10-CM | POA: Diagnosis not present

## 2022-01-04 DIAGNOSIS — C49A1 Gastrointestinal stromal tumor of esophagus: Secondary | ICD-10-CM | POA: Diagnosis not present

## 2022-01-04 DIAGNOSIS — F17210 Nicotine dependence, cigarettes, uncomplicated: Secondary | ICD-10-CM | POA: Diagnosis not present

## 2022-01-04 DIAGNOSIS — Z800 Family history of malignant neoplasm of digestive organs: Secondary | ICD-10-CM | POA: Diagnosis not present

## 2022-01-04 DIAGNOSIS — Z79899 Other long term (current) drug therapy: Secondary | ICD-10-CM | POA: Diagnosis not present

## 2022-01-04 DIAGNOSIS — Z5112 Encounter for antineoplastic immunotherapy: Secondary | ICD-10-CM | POA: Diagnosis not present

## 2022-01-04 DIAGNOSIS — R002 Palpitations: Secondary | ICD-10-CM | POA: Diagnosis not present

## 2022-01-04 DIAGNOSIS — Z5111 Encounter for antineoplastic chemotherapy: Secondary | ICD-10-CM | POA: Diagnosis not present

## 2022-01-04 DIAGNOSIS — I10 Essential (primary) hypertension: Secondary | ICD-10-CM | POA: Diagnosis not present

## 2022-01-04 LAB — CBC WITH DIFFERENTIAL/PLATELET
Abs Immature Granulocytes: 0.01 10*3/uL (ref 0.00–0.07)
Basophils Absolute: 0 10*3/uL (ref 0.0–0.1)
Basophils Relative: 0 %
Eosinophils Absolute: 0 10*3/uL (ref 0.0–0.5)
Eosinophils Relative: 1 %
HCT: 33.3 % — ABNORMAL LOW (ref 39.0–52.0)
Hemoglobin: 10.9 g/dL — ABNORMAL LOW (ref 13.0–17.0)
Immature Granulocytes: 0 %
Lymphocytes Relative: 39 %
Lymphs Abs: 2 10*3/uL (ref 0.7–4.0)
MCH: 25.1 pg — ABNORMAL LOW (ref 26.0–34.0)
MCHC: 32.7 g/dL (ref 30.0–36.0)
MCV: 76.7 fL — ABNORMAL LOW (ref 80.0–100.0)
Monocytes Absolute: 0.8 10*3/uL (ref 0.1–1.0)
Monocytes Relative: 16 %
Neutro Abs: 2.3 10*3/uL (ref 1.7–7.7)
Neutrophils Relative %: 44 %
Platelets: 186 10*3/uL (ref 150–400)
RBC: 4.34 MIL/uL (ref 4.22–5.81)
RDW: 22.9 % — ABNORMAL HIGH (ref 11.5–15.5)
WBC: 5.2 10*3/uL (ref 4.0–10.5)
nRBC: 0 % (ref 0.0–0.2)

## 2022-01-04 LAB — COMPREHENSIVE METABOLIC PANEL
ALT: 11 U/L (ref 0–44)
AST: 22 U/L (ref 15–41)
Albumin: 3.2 g/dL — ABNORMAL LOW (ref 3.5–5.0)
Alkaline Phosphatase: 61 U/L (ref 38–126)
Anion gap: 10 (ref 5–15)
BUN: 33 mg/dL — ABNORMAL HIGH (ref 6–20)
CO2: 18 mmol/L — ABNORMAL LOW (ref 22–32)
Calcium: 9.1 mg/dL (ref 8.9–10.3)
Chloride: 106 mmol/L (ref 98–111)
Creatinine, Ser: 1.14 mg/dL (ref 0.61–1.24)
GFR, Estimated: >60 mL/min (ref >60)
Glucose, Bld: 88 mg/dL (ref 70–99)
Potassium: 3.4 mmol/L — ABNORMAL LOW (ref 3.5–5.1)
Sodium: 134 mmol/L — ABNORMAL LOW (ref 135–145)
Total Bilirubin: 0.6 mg/dL (ref 0.3–1.2)
Total Protein: 7.1 g/dL (ref 6.5–8.1)

## 2022-01-04 LAB — TSH: TSH: 0.878 u[IU]/mL (ref 0.350–4.500)

## 2022-01-04 LAB — MAGNESIUM: Magnesium: 2 mg/dL (ref 1.7–2.4)

## 2022-01-04 MED ORDER — SODIUM CHLORIDE 0.9 % IV SOLN
240.0000 mg | Freq: Once | INTRAVENOUS | Status: AC
  Administered 2022-01-04: 240 mg via INTRAVENOUS
  Filled 2022-01-04: qty 24

## 2022-01-04 MED ORDER — DEXTROSE 5 % IV SOLN: Freq: Once | INTRAVENOUS | Status: AC

## 2022-01-04 MED ORDER — FLUOROURACIL CHEMO INJECTION 2.5 GM/50ML
400.0000 mg/m2 | Freq: Once | INTRAVENOUS | Status: AC
  Administered 2022-01-04: 600 mg via INTRAVENOUS
  Filled 2022-01-04: qty 12

## 2022-01-04 MED ORDER — LEUCOVORIN CALCIUM INJECTION 350 MG
400.0000 mg/m2 | Freq: Once | INTRAVENOUS | Status: AC
  Administered 2022-01-04: 592 mg via INTRAVENOUS
  Filled 2022-01-04: qty 29.6

## 2022-01-04 MED ORDER — DOXYCYCLINE HYCLATE 100 MG PO CAPS: 100.0000 mg | ORAL_CAPSULE | Freq: Two times a day (BID) | ORAL | 0 refills | Status: DC

## 2022-01-04 MED ORDER — AMOXICILLIN-POT CLAVULANATE 600-42.9 MG/5ML PO SUSR: 600.0000 mg | Freq: Two times a day (BID) | ORAL | 0 refills | Status: DC

## 2022-01-04 MED ORDER — ALBUTEROL SULFATE HFA 108 (90 BASE) MCG/ACT IN AERS: 1.0000 | INHALATION_SPRAY | Freq: Four times a day (QID) | RESPIRATORY_TRACT | 0 refills | Status: DC | PRN

## 2022-01-04 MED ORDER — SODIUM CHLORIDE 0.9 % IV SOLN
2400.0000 mg/m2 | INTRAVENOUS | Status: DC
  Administered 2022-01-04: 3550 mg via INTRAVENOUS
  Filled 2022-01-04: qty 71

## 2022-01-04 MED ORDER — PALONOSETRON HCL INJECTION 0.25 MG/5ML
0.2500 mg | Freq: Once | INTRAVENOUS | Status: AC
  Administered 2022-01-04: 0.25 mg via INTRAVENOUS
  Filled 2022-01-04: qty 5

## 2022-01-04 MED ORDER — SODIUM CHLORIDE 0.9% FLUSH: 10.0000 mL | INTRAVENOUS | Status: DC | PRN

## 2022-01-04 MED ORDER — SODIUM CHLORIDE 0.9 % IV SOLN
10.0000 mg | Freq: Once | INTRAVENOUS | Status: AC
  Administered 2022-01-04: 10 mg via INTRAVENOUS
  Filled 2022-01-04: qty 10

## 2022-01-04 MED ORDER — OXALIPLATIN CHEMO INJECTION 100 MG/20ML
85.0000 mg/m2 | Freq: Once | INTRAVENOUS | Status: AC
  Administered 2022-01-04: 125 mg via INTRAVENOUS
  Filled 2022-01-04: qty 20

## 2022-01-04 MED ORDER — HEPARIN SOD (PORK) LOCK FLUSH 100 UNIT/ML IV SOLN: 500.0000 [IU] | Freq: Once | INTRAVENOUS | Status: DC | PRN

## 2022-01-04 NOTE — Patient Instructions (Signed)
[facility]  Discharge Instructions: Thank you for choosing [facility] to provide your oncology and hematology care.  If you have a lab appointment with the [facility], please come in thru the Main Entrance and check in at the main information desk.  Wear comfortable clothing and clothing appropriate for easy access to any Portacath or PICC line.   We strive to give you quality time with your provider. You may need to reschedule your appointment if you arrive late (15 or more minutes).  Arriving late affects you and other patients whose appointments are after yours.  Also, if you miss three or more appointments without notifying the office, you may be dismissed from the clinic at the provider's discretion.      For prescription refill requests, have your pharmacy contact our office and allow 72 hours for refills to be completed.    Today you received the following chemotherapy and/or immunotherapy agents Folfox opdivo 5FU      To help prevent nausea and vomiting after your treatment, we encourage you to take your nausea medication as directed.  BELOW ARE SYMPTOMS THAT SHOULD BE REPORTED IMMEDIATELY: *FEVER GREATER THAN 100.4 F (38 C) OR HIGHER *CHILLS OR SWEATING *NAUSEA AND VOMITING THAT IS NOT CONTROLLED WITH YOUR NAUSEA MEDICATION *UNUSUAL SHORTNESS OF BREATH *UNUSUAL BRUISING OR BLEEDING *URINARY PROBLEMS (pain or burning when urinating, or frequent urination) *BOWEL PROBLEMS (unusual diarrhea, constipation, pain near the anus) TENDERNESS IN MOUTH AND THROAT WITH OR WITHOUT PRESENCE OF ULCERS (sore throat, sores in mouth, or a toothache) UNUSUAL RASH, SWELLING OR PAIN  UNUSUAL VAGINAL DISCHARGE OR ITCHING   Items with * indicate a potential emergency and should be followed up as soon as possible or go to the Emergency Department if any problems should occur.  Please show the CHEMOTHERAPY ALERT CARD or IMMUNOTHERAPY ALERT CARD at check-in to the  Emergency Department and triage nurse.  Should you have questions after your visit or need to cancel or reschedule your appointment, please contact [facility] [phone]  and follow the prompts.  Office hours are 8:00 a.m. to 4:30 p.m. Monday - Friday. Please note that voicemails left after 4:00 p.m. may not be returned until the following business day.  We are closed weekends and major holidays. You have access to a nurse at all times for urgent questions. Please call the main number to the clinic [phone] and follow the prompts.  For any non-urgent questions, you may also contact your provider using MyChart. We now offer e-Visits for anyone 61 and older to request care online for non-urgent symptoms. For details visit [email or web address].   Also download the MyChart app! Go to the app store, search "MyChart", open the app, select [facility], and log in with your MyChart username and password.  Masks are optional in the cancer centers. If you would like for your care team to wear a mask while they are taking care of you, please let them know. You may have one support person who is at least 53 years old accompany you for your appointments.

## 2022-01-04 NOTE — Progress Notes (Signed)
Patient presents today for Opdivo, FOLFOX with 5FU pump start per providers order.  Vital signs within parameters for treatment.  Labs pending.  Patient complains of being fatigued and SOB with exertion, that he has had since his last office visit.  Lab within parameters for treatment.    Patient is now asking if he can get home O2 that Dr. Delton Coombes had talked to hom about it before.  Anastasio Champion RN Dr. Fransico Michael nurse was notified and a walking O2 was preformed.  O2 saturations remained normal.  Heart rate was elevated.  Tarri Abernethy PA came to see the patient and the patient was sent down for chest Xray.  Pharmacy was notified to hold chemo until notified.  Message received from [facility], to proceed with treatment.  Stable during infusion without adverse affects.  Vital signs stable.  5FU pump connected and verified RUN on the screen with the patient.  No complaints at this time.  Discharge from clinic ambulatory in stable condition.  Alert and oriented X 3.  Follow up with [facility] as scheduled.

## 2022-01-04 NOTE — Progress Notes (Signed)
Pt c/o shortness of breath, productive cough, lightheadedness, and fatigue. Reports he can only walk a few steps w/o having to stop and take a break. Five minute ambulatory SpO2 with sats never dropping below 97%. However, heart rate is elevated during ambulation at 126-128. Notified R. [place], PA of above. She will see the pt in the clinic today to evaluate.

## 2022-01-04 NOTE — Patient Instructions (Addendum)
[facility] at [facility] **   You were seen today by Tarri Abernethy PA-C for your shortness of breath and cough.   Your chest x-ray did not show any definite signs of pneumonia, but you could still have an underlying lung infection or bronchitis. 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 We will send prescription for Augmentin and doxycycline twice daily for the next 5 days.  These are antibiotics to treat any lung infections you may have. Augmentin comes in liquid form that can be taken through G-tube. Your doxycycline capsules can be broken open and dissolved in liquid to be taken through G-tube. I also recommend Mucinex to help with cough.  This medication can be crushed and dissolved in liquid to be taken through your G-tube. I will write prescription for albuterol inhaler.  You can take 2 puffs as needed every 4-6 hours for wheezing. Please seek immediate medical attention if you have any worsening breathing symptoms or fever.   ** Thank you for trusting me with your healthcare!  I strive to provide all of my patients with quality care at each visit.  If you receive a survey for this visit, I would be so grateful to you for taking the time to provide feedback.  Thank you in advance!  ~ [place]                   Dr. Derek Jack   &   Tarri Abernethy, PA-C   - - - - - - - - - - - - - - - - - -    Thank you for choosing [facility] at [facility] to provide your oncology and hematology care.  To afford each patient quality time with our provider, please arrive at least 15 minutes before your scheduled appointment time.   If you have a lab appointment with the [facility] please come in thru the Main Entrance and check in at the main  information desk.  You need to re-schedule your appointment should you arrive 10 or more minutes late.  We strive to give you quality time with our providers, and arriving late affects you and other patients whose appointments are after yours.  Also, if you no show three or more times for appointments you may be dismissed from the clinic at the providers discretion.     Again, thank you for choosing [facility].  Our hope is that these requests will decrease the amount of time that you wait before being seen by our physicians.       _____________________________________________________________  Should you have questions after your visit to [facility], please contact our office at [phone] and follow the prompts.  Our office hours are 8:00 a.m. and 4:30 p.m. Monday - Friday.  Please note that voicemails left after 4:00 p.m. may not be returned until the following business day.  We are closed weekends and major holidays.  You do have access to a nurse 24-7, just call the main number to the clinic [phone] and do not press any options, hold on the line and a nurse will answer the phone.    For prescription refill requests, have your pharmacy contact our office and allow 72 hours.

## 2022-01-04 NOTE — Progress Notes (Signed)
[place] [facility] [place] [address] Phone: [phone] Fax: [facility] PROGRESS NOTE   Azarel Banner [id number] April 21, 1971 51 y.o.  Trevor [name] is managed by Dr. Delton Coombes for stage IV esophageal cancer.  Actively treated with chemotherapy/immunotherapy/hormonal therapy: YES  Current therapy: FOLFOX + Nivolumab q14d  Last treated: 12/21/2021  Next scheduled appointment with provider: 01/18/2022  Subjective:  Chief Complaint: Shortness of breath and cough  Trevor [name] is managed by Dr. Delton Coombes for stage IV esophageal cancer.  He presents today for treatment (FOLFOX with 5-FU pump start + Opdivo), but complains of increased shortness of breath and cough for the past 3 weeks.  He reports wheezing, shortness of breath, fatigue, and lightheadedness.  He reports progressive cough productive of yellow-green phlegm.  He denies any fever or chills.  He had ED visit on 12/15/2021 and was given prescription of azithromycin due to concern for possible pneumonia.  He had a CTA chest performed in [facility] emergency department on 12/15/2021, which showed interim development of spiculated area of consolidation in the medial right lung base with cavitation and fluid level, thought to be infectious versus neoplastic in etiology; left upper lobe juxta hilar nodule was decreased, with decreased esophageal thickening compared to prior CT.   Review of Systems:  Review of Systems  Constitutional:  Positive for appetite change and fatigue. Negative for activity change, chills, diaphoresis, fever and unexpected weight change.  HENT:  Negative for mouth sores, nosebleeds, sore throat and trouble swallowing.   Respiratory:  Positive for cough and shortness of breath.   Cardiovascular:  Positive for palpitations. Negative for chest pain and leg swelling.  Gastrointestinal:  Negative for abdominal pain, blood in stool, constipation, diarrhea,  nausea and vomiting.  Genitourinary:  Negative for dysuria and hematuria.  Neurological:  Positive for light-headedness. Negative for dizziness, numbness and headaches.  Psychiatric/Behavioral:  Negative for dysphoric mood and sleep disturbance. The patient is not nervous/anxious.      Past Medical History, Surgical history, Social history, and Family history were reviewed as documented elsewhere in chart, and were updated as appropriate.   Assessment & Plan:    1.  Shortness of breath and cough  - Patient reported shortness of breath and cough for the past 3 weeks, + wheezing, + fatigue, + lightheadedness, + productive cough (yellow-green phlegm) - ED visit on 12/15/2021 and was given prescription of azithromycin due to concern for possible pneumonia. - CTA chest performed in [facility] emergency department on 12/15/2021, which showed interim development of spiculated area of consolidation in the medial right lung base with cavitation and fluid level, thought to be infectious versus neoplastic in etiology; left upper lobe juxta hilar nodule was decreased, with decreased esophageal thickening compared to prior CT. - CXR today showed no acute abnormalities.  No known history of COPD, patient has history of smoking and per discussion with radiologist, there are some signs of possible COPD on his x-ray. - Labs today show normal white count, platelets, differential.  Baseline hemoglobin 10.9.  CMP shows BUN slightly increased above baseline at 33, but with normal creatinine 1.14. - PLAN: Discussed with supervising physician (Dr. Grayland Ormond) - will proceed with treatment since patient is afebrile, but will treat empirically for possible pneumonia. - Empiric treatment of possible community-acquired pneumonia in immunocompromised adult with Augmentin (875 mg twice daily) and doxycycline (100 mg twice daily) x5 days.   - Mucinex and albuterol inhaler to assist with pulmonary toilet.  2.  Stage IV esophageal  cancer - Primary oncologist is Dr. Delton Coombes - Currently receiving treatment with FOLFOX + nivolumab every 14 days - PLAN: Follow-up with Dr. Delton Coombes as scheduled on 01/18/2022   Objective:   Physical Exam:  There were no vitals taken for this visit. ECOG: 1  Physical Exam Constitutional:      Appearance: Normal appearance. He is cachectic.     Comments: Significant temporal muscle wasting noted  HENT:     Head: Normocephalic and atraumatic.     Mouth/Throat:     Mouth: Mucous membranes are moist.  Eyes:     Extraocular Movements: Extraocular movements intact.     Pupils: Pupils are equal, round, and reactive to light.  Cardiovascular:     Rate and Rhythm: Normal rate and regular rhythm.     Pulses: Normal pulses.     Heart sounds: Normal heart sounds.  Pulmonary:     Effort: Pulmonary effort is normal.     Breath sounds: Normal breath sounds. Decreased air movement present.  Abdominal:     General: Bowel sounds are normal.     Palpations: Abdomen is soft.     Tenderness: There is no abdominal tenderness.  Musculoskeletal:        General: No swelling.     Right lower leg: No edema.     Left lower leg: No edema.  Lymphadenopathy:     Cervical: No cervical adenopathy.  Skin:    General: Skin is warm and dry.  Neurological:     General: No focal deficit present.     Mental Status: He is alert and oriented to person, place, and time.  Psychiatric:        Mood and Affect: Mood normal.        Behavior: Behavior normal.     Lab Review:     Component Value Date/Time   NA 134 (L) 01/04/2022 0745   K 3.4 (L) 01/04/2022 0745   CL 106 01/04/2022 0745   CO2 18 (L) 01/04/2022 0745   GLUCOSE 88 01/04/2022 0745   BUN 33 (H) 01/04/2022 0745   CREATININE 1.14 01/04/2022 0745   CALCIUM 9.1 01/04/2022 0745   PROT 7.1 01/04/2022 0745   ALBUMIN 3.2 (L) 01/04/2022 0745   AST 22 01/04/2022 0745   ALT 11 01/04/2022 0745   ALKPHOS 61 01/04/2022 0745   BILITOT 0.6  01/04/2022 0745   GFRNONAA >60 01/04/2022 0745       Component Value Date/Time   WBC 5.2 01/04/2022 0745   RBC 4.34 01/04/2022 0745   HGB 10.9 (L) 01/04/2022 0745   HCT 33.3 (L) 01/04/2022 0745   PLT 186 01/04/2022 0745   MCV 76.7 (L) 01/04/2022 0745   MCH 25.1 (L) 01/04/2022 0745   MCHC 32.7 01/04/2022 0745   RDW 22.9 (H) 01/04/2022 0745   LYMPHSABS 2.0 01/04/2022 0745   MONOABS 0.8 01/04/2022 0745   EOSABS 0.0 01/04/2022 0745   BASOSABS 0.0 01/04/2022 0745   -------------------------------  Imaging from last 24 hours (if applicable):  Radiology interpretation: CT Angio Chest PE W and/or Wo Contrast  Result Date: 12/15/2021 CLINICAL DATA:  Palpitation short of breath esophageal cancer EXAM: CT ANGIOGRAPHY CHEST WITH CONTRAST TECHNIQUE: Multidetector CT imaging of the chest was performed using the standard protocol during bolus administration of intravenous contrast. Multiplanar CT image reconstructions and MIPs were obtained to evaluate the vascular anatomy. RADIATION DOSE REDUCTION: This exam was performed according to the departmental dose-optimization program which includes automated  exposure control, adjustment of the mA and/or kV according to patient size and/or use of iterative reconstruction technique. CONTRAST:  33m OMNIPAQUE IOHEXOL 350 MG/ML SOLN COMPARISON:  PET CT 09/22/2021 FINDINGS: Cardiovascular: Satisfactory opacification of the pulmonary arteries to the segmental level. No evidence of pulmonary embolism. Normal heart size. No pericardial effusion. Left-sided central venous port with tip at the cavoatrial region. Nonaneurysmal aorta. Mild atherosclerosis. Mediastinum/Nodes: Midline trachea. No thyroid mass. No increasing adenopathy. Decreased esophageal thickening compared to prior. Lungs/Pleura: No pleural effusion or pneumothorax. Interim finding of spiculated consolidation within the medial right lung base with cavitation and fluid level, series 6, image 88. This  measures 2.5 by 2.3 by 3.1 cm. The previously noted left juxta hilar left upper lobe metastatic nodule has resolved. Upper Abdomen: Reflux of contrast into the IVC and right hepatic vein. Musculoskeletal: No acute osseous abnormality. Review of the MIP images confirms the above findings. IMPRESSION: 1. Negative for acute pulmonary embolism. 2. Decreased left upper lobe juxtahilar nodule compared to prior exam. Interim development of spiculated area of consolidation in the medial right lung base with cavitation and fluid level, findings could be infectious or neoplastic in etiology. 3. Decreased esophageal thickening compared to the prior CT. Aortic Atherosclerosis (ICD10-I70.0). Electronically Signed   By: KDonavan FoilM.D.   On: 12/15/2021 18:16   DG Chest Port 1 View  Result Date: 12/15/2021 CLINICAL DATA:  Provided history: Palpitations. Patient currently being treated for esophageal cancer. EXAM: PORTABLE CHEST 1 VIEW COMPARISON:  Chest radiograph 10/12/2021.  Head CT 09/22/2021. FINDINGS: Left chest infusion port catheter with tip projecting at the level of the mid SVC. Heart size within normal limits. No appreciable airspace consolidation or pulmonary edema. No evidence of pleural effusion or pneumothorax. No acute bony abnormality identified. Redemonstrated chronic bilateral rib fracture deformities. IMPRESSION: No evidence of acute cardiopulmonary abnormality. Electronically Signed   By: KKellie SimmeringD.O.   On: 12/15/2021 14:24      Wrap-Up:    Alll questions were answered. The patient knows to call the clinic with any problems, questions or concerns.  Medical decision making: Moderate  Time spent on visit: I spent 25 minutes counseling the patient face to face. The total time spent in the appointment was 40 minutes and more than 50% was on counseling.   RHarriett Rush PA-C  01/04/22 11:34 PM

## 2022-01-06 ENCOUNTER — Other Ambulatory Visit: Payer: Self-pay

## 2022-01-06 ENCOUNTER — Inpatient Hospital Stay: Payer: 59

## 2022-01-06 VITALS — BP 103/79 | HR 100 | Temp 97.2°F | Resp 19

## 2022-01-06 DIAGNOSIS — R058 Other specified cough: Secondary | ICD-10-CM | POA: Diagnosis not present

## 2022-01-06 DIAGNOSIS — R000 Tachycardia, unspecified: Secondary | ICD-10-CM | POA: Diagnosis not present

## 2022-01-06 DIAGNOSIS — E119 Type 2 diabetes mellitus without complications: Secondary | ICD-10-CM | POA: Diagnosis not present

## 2022-01-06 DIAGNOSIS — Z800 Family history of malignant neoplasm of digestive organs: Secondary | ICD-10-CM | POA: Diagnosis not present

## 2022-01-06 DIAGNOSIS — R002 Palpitations: Secondary | ICD-10-CM | POA: Diagnosis not present

## 2022-01-06 DIAGNOSIS — F17210 Nicotine dependence, cigarettes, uncomplicated: Secondary | ICD-10-CM | POA: Diagnosis not present

## 2022-01-06 DIAGNOSIS — R911 Solitary pulmonary nodule: Secondary | ICD-10-CM | POA: Diagnosis not present

## 2022-01-06 DIAGNOSIS — Z8042 Family history of malignant neoplasm of prostate: Secondary | ICD-10-CM | POA: Diagnosis not present

## 2022-01-06 DIAGNOSIS — Z5112 Encounter for antineoplastic immunotherapy: Secondary | ICD-10-CM | POA: Diagnosis not present

## 2022-01-06 DIAGNOSIS — C155 Malignant neoplasm of lower third of esophagus: Secondary | ICD-10-CM | POA: Diagnosis not present

## 2022-01-06 DIAGNOSIS — I10 Essential (primary) hypertension: Secondary | ICD-10-CM | POA: Diagnosis not present

## 2022-01-06 DIAGNOSIS — C159 Malignant neoplasm of esophagus, unspecified: Secondary | ICD-10-CM

## 2022-01-06 DIAGNOSIS — Z79899 Other long term (current) drug therapy: Secondary | ICD-10-CM | POA: Diagnosis not present

## 2022-01-06 DIAGNOSIS — R0609 Other forms of dyspnea: Secondary | ICD-10-CM | POA: Diagnosis not present

## 2022-01-06 DIAGNOSIS — R634 Abnormal weight loss: Secondary | ICD-10-CM | POA: Diagnosis not present

## 2022-01-06 DIAGNOSIS — R0602 Shortness of breath: Secondary | ICD-10-CM | POA: Diagnosis not present

## 2022-01-06 DIAGNOSIS — Z5111 Encounter for antineoplastic chemotherapy: Secondary | ICD-10-CM | POA: Diagnosis not present

## 2022-01-06 LAB — T4: T4, Total: 8.3 ug/dL (ref 4.5–12.0)

## 2022-01-06 MED ORDER — HEPARIN SOD (PORK) LOCK FLUSH 100 UNIT/ML IV SOLN
500.0000 [IU] | Freq: Once | INTRAVENOUS | Status: AC | PRN
  Administered 2022-01-06: 500 [IU]

## 2022-01-06 MED ORDER — MISC. DEVICES MISC: 99 refills | Status: DC

## 2022-01-06 MED ORDER — SODIUM CHLORIDE 0.9% FLUSH
10.0000 mL | INTRAVENOUS | Status: DC | PRN
  Administered 2022-01-06: 10 mL

## 2022-01-06 NOTE — Progress Notes (Signed)
Patient evaluated by me at bedside during his pump DC visit today. He reports slightly decreased shortness of breath and improved expectoration and clearance of sputum with albuterol inhaler.  He continues to report severe fatigue and dyspnea on exertion.  No chest pain.  Initial screening pulse oximetry showed oxygen dropped to 85% with exertion, but per full 2-minute walk test, patient did not qualify for supplemental oxygen: ROOM AIR AT REST: Oxygen saturation 100%, heart rate 99 ROOM AIR WHILE WALKING: Oxygen saturation 91%, heart rate 135  Discussed with patient that he does not qualify for home submental oxygen at this time, but that we we will reevaluate him as needed if his condition deteriorates.  Advised to seek emergency medical attention if he deteriorates over the weekend, otherwise we remain available for symptom management assistance throughout the week.  Discussed with patient that he has severe physical deconditioning secondary to his stage IV esophageal cancer and treatment.  May have some element of dehydration as well.  Currently has 64 ounce fluid intake via G-tube daily.  Recommended to increase to 96 ounces fluid intake daily, but to contact us if he notices any signs of fluid overload.  Tarri Abernethy PA-C 01/06/2022 8:44 PM

## 2022-01-06 NOTE — Progress Notes (Signed)
Patient presents today for pump d/d. Port flushed with good blood return noted. No bruising or swelling at site. Patient reports having shortness of breath when walking, O2 and HR at rest at 100% and 100, O2 during walking dropped down to 85% and HR increased to 121, [employer] PA made aware. Band-Aid applied and patient discharged in satisfactory condition. VVS stable with no signs or symptoms of distressed noted.

## 2022-01-06 NOTE — Patient Instructions (Signed)
[facility]  Discharge Instructions: Thank you for choosing [facility] to provide your oncology and hematology care.  If you have a lab appointment with the [facility], please come in thru the Main Entrance and check in at the main information desk.  Wear comfortable clothing and clothing appropriate for easy access to any Portacath or PICC line.   We strive to give you quality time with your provider. You may need to reschedule your appointment if you arrive late (15 or more minutes).  Arriving late affects you and other patients whose appointments are after yours.  Also, if you miss three or more appointments without notifying the office, you may be dismissed from the clinic at the provider's discretion.      For prescription refill requests, have your pharmacy contact our office and allow 72 hours for refills to be completed.    Today you received the following pump d/c and port flush, return as scheduled.   To help prevent nausea and vomiting after your treatment, we encourage you to take your nausea medication as directed.  BELOW ARE SYMPTOMS THAT SHOULD BE REPORTED IMMEDIATELY: *FEVER GREATER THAN 100.4 F (38 C) OR HIGHER *CHILLS OR SWEATING *NAUSEA AND VOMITING THAT IS NOT CONTROLLED WITH YOUR NAUSEA MEDICATION *UNUSUAL SHORTNESS OF BREATH *UNUSUAL BRUISING OR BLEEDING *URINARY PROBLEMS (pain or burning when urinating, or frequent urination) *BOWEL PROBLEMS (unusual diarrhea, constipation, pain near the anus) TENDERNESS IN MOUTH AND THROAT WITH OR WITHOUT PRESENCE OF ULCERS (sore throat, sores in mouth, or a toothache) UNUSUAL RASH, SWELLING OR PAIN  UNUSUAL VAGINAL DISCHARGE OR ITCHING   Items with * indicate a potential emergency and should be followed up as soon as possible or go to the Emergency Department if any problems should occur.  Please show the CHEMOTHERAPY ALERT CARD or IMMUNOTHERAPY ALERT CARD at check-in to the Emergency  Department and triage nurse.  Should you have questions after your visit or need to cancel or reschedule your appointment, please contact [facility] [phone]  and follow the prompts.  Office hours are 8:00 a.m. to 4:30 p.m. Monday - Friday. Please note that voicemails left after 4:00 p.m. may not be returned until the following business day.  We are closed weekends and major holidays. You have access to a nurse at all times for urgent questions. Please call the main number to the clinic [phone] and follow the prompts.  For any non-urgent questions, you may also contact your provider using MyChart. We now offer e-Visits for anyone 63 and older to request care online for non-urgent symptoms. For details visit [email or web address].   Also download the MyChart app! Go to the app store, search "MyChart", open the app, select [facility], and log in with your MyChart username and password.  Masks are optional in the cancer centers. If you would like for your care team to wear a mask while they are taking care of you, please let them know. You may have one support person who is at least 53 years old accompany you for your appointments.

## 2022-01-09 ENCOUNTER — Encounter: Payer: 59 | Admitting: Dietician

## 2022-01-09 ENCOUNTER — Telehealth: Payer: Self-pay | Admitting: Dietician

## 2022-01-09 NOTE — Telephone Encounter (Signed)
Nutrition Follow-up:  Patient with stage IV esophageal cancer. He is receiving FOLFOX + Nivolumab q14d (started 7/5). S/p open G-tube on 6/21  9/13 - pt seen in symptom management for SOB and cough ongoing x 3 weeks.   Spoke with patient via telephone. He reports "feeling down" today. Patient says he is still continues feeling ill with cough, shortness of breath, and fatigue. Patient says he has not started taking medications prescribed to him by PA on 9/13 because he "can't swallow anything." Reviewed visit summary that was given to patient. Medications prescribed to patient may be administered via tube. Patient says he did not know this. He will have his brother pick these up from the pharmacy today. Patient reports tolerating tube feedings at goal. He is giving one carton of Nutren 1.5 x5/day. Patient having 2-3 loose stools. He denies nausea, vomiting.    Medications: reviewed   Labs: 9/13 - Na 134, K 3.4, BUN 33  Anthropometrics: Last weight 100 lb on 9/13  8/24 - 105 lb 6.1 oz 8/16 - 105 lb 6.4 oz 7/29 - 121 lb 12.8 oz   Estimated Energy Needs   Kcals: 1577-1720 Protein: 77-96 Fluid: >1.5 L   NUTRITION DIAGNOSIS: Severe malnutrition continues      INTERVENTION:  Continue 5 cartons Nutren 1.5 - give one 250 ml carton q3h (7AM-7PM) provides 1875 kcal, 85 g protein, 955 ml free water from formula (1555 ml total water with 120 ml flush 5x/day)     MONITORING, EVALUATION, GOAL: weight trends, tube feeding    NEXT VISIT: via telephone in one week

## 2022-01-10 ENCOUNTER — Other Ambulatory Visit: Payer: Self-pay

## 2022-01-10 DIAGNOSIS — C159 Malignant neoplasm of esophagus, unspecified: Secondary | ICD-10-CM

## 2022-01-10 NOTE — Progress Notes (Signed)
Order placed for CT CAP ASAP per Tarri Abernethy, PA-C.

## 2022-01-10 NOTE — Progress Notes (Signed)
[facility] [address], [place] [address] Phone: [phone] Fax: [place] PROGRESS NOTE   Trevor [name] [id number] 1970/09/06 51 y.o.  Trevor [name] is managed by Dr. Delton Coombes for stage IV esophageal cancer.   Actively treated with chemotherapy/immunotherapy/hormonal therapy: YES   Current therapy: FOLFOX + Nivolumab q14d   Last treated: 01/04/2022   Next scheduled appointment with provider: 01/18/2022  Subjective:  Chief Complaint: Symptom management follow-up  Trevor [name] is managed by Dr. Delton Coombes for stage IV esophageal cancer.  He presents today for symptom management follow-up.  He was seen last week due to increasing shortness of breath, cough, wheezing, fatigue, and lightheadedness.  He would like to be evaluated to see if he is well enough to go back to work.  He reports some improvement in his ability to cough up phlegm, reports decreased wheezing.  He continues to report significant fatigue and dyspnea on exertion.  Is unable to ambulate more than 10 feet without stopping to rest and catch his breath.  No fever, chills, night sweats.  He continues to lose weight, has lost 7 pounds in the past week with weight today 93 pounds (BMI 15.02).  He reports energy 30% with 40% appetite.    Review of Systems:  Review of Systems  Constitutional:  Positive for appetite change and fatigue. Negative for activity change, chills, diaphoresis, fever and unexpected weight change.  HENT:  Negative for mouth sores, nosebleeds, sore throat and trouble swallowing.   Respiratory:  Positive for cough and shortness of breath.   Cardiovascular:  Positive for palpitations (Racing heartbeat). Negative for chest pain and leg swelling.  Gastrointestinal:  Negative for abdominal pain, blood in stool, constipation, diarrhea, nausea and vomiting.  Genitourinary:  Negative for dysuria and hematuria.  Neurological:  Positive for  light-headedness. Negative for dizziness, numbness and headaches.  Psychiatric/Behavioral:  Negative for dysphoric mood and sleep disturbance. The patient is not nervous/anxious.      Past Medical History, Surgical history, Social history, and Family history were reviewed as documented elsewhere in chart, and were updated as appropriate.   Assessment & Plan:    1.  Stage IV esophageal cancer - Primary oncologist is Dr. Delton Coombes - Currently receiving treatment with FOLFOX + nivolumab every 14 days - Patient continues to exhibit significant functional decline, deconditioning, and weight loss. - Respiratory symptoms have slightly improved since last week, but he continues to have severe dyspnea on exertion and palpitations/racing heart with exertion. - PLAN: Patient is NOT medically cleared to go back to work.  Note written to excuse patient from work indefinitely, until stated otherwise by our office. - We will schedule patient for CT CAP on Monday, 01/16/2022, per Dr. Tomie China note which requested CT scan after patient's sixth cycle of chemotherapy. - Follow-up with Dr. Delton Coombes as scheduled on 01/18/2022.  Recommend additional goals of care discussion with Dr. Delton Coombes at that time. - Given the patient's overall decline, referral has been sent to palliative care, and we have discussed with him the serious nature of his condition. - Have discussed extensively with dietitian and social worker, who are also following closely with patient.   Objective:   Physical Exam:  There were no vitals taken for this visit. ECOG: 3  Physical Exam Constitutional:      Appearance: Normal appearance. He is cachectic.     Comments: Significant temporal muscle wasting noted  HENT:     Head: Normocephalic and atraumatic.  Mouth/Throat:     Mouth: Mucous membranes are moist.  Eyes:     Extraocular Movements: Extraocular movements intact.     Pupils: Pupils are equal, round, and reactive to  light.  Cardiovascular:     Rate and Rhythm: Normal rate and regular rhythm.     Pulses: Normal pulses.     Heart sounds: Normal heart sounds.  Pulmonary:     Effort: Pulmonary effort is normal.     Breath sounds: Normal breath sounds. Decreased air movement present.  Abdominal:     General: Bowel sounds are normal.     Palpations: Abdomen is soft.     Tenderness: There is no abdominal tenderness.  Musculoskeletal:        General: No swelling.     Right lower leg: No edema.     Left lower leg: No edema.  Lymphadenopathy:     Cervical: No cervical adenopathy.  Skin:    General: Skin is warm and dry.  Neurological:     General: No focal deficit present.     Mental Status: He is alert and oriented to person, place, and time.  Psychiatric:        Mood and Affect: Mood normal.        Behavior: Behavior normal.     Lab Review:     Component Value Date/Time   NA 134 (L) 01/04/2022 0745   K 3.4 (L) 01/04/2022 0745   CL 106 01/04/2022 0745   CO2 18 (L) 01/04/2022 0745   GLUCOSE 88 01/04/2022 0745   BUN 33 (H) 01/04/2022 0745   CREATININE 1.14 01/04/2022 0745   CALCIUM 9.1 01/04/2022 0745   PROT 7.1 01/04/2022 0745   ALBUMIN 3.2 (L) 01/04/2022 0745   AST 22 01/04/2022 0745   ALT 11 01/04/2022 0745   ALKPHOS 61 01/04/2022 0745   BILITOT 0.6 01/04/2022 0745   GFRNONAA >60 01/04/2022 0745       Component Value Date/Time   WBC 5.2 01/04/2022 0745   RBC 4.34 01/04/2022 0745   HGB 10.9 (L) 01/04/2022 0745   HCT 33.3 (L) 01/04/2022 0745   PLT 186 01/04/2022 0745   MCV 76.7 (L) 01/04/2022 0745   MCH 25.1 (L) 01/04/2022 0745   MCHC 32.7 01/04/2022 0745   RDW 22.9 (H) 01/04/2022 0745   LYMPHSABS 2.0 01/04/2022 0745   MONOABS 0.8 01/04/2022 0745   EOSABS 0.0 01/04/2022 0745   BASOSABS 0.0 01/04/2022 0745   -------------------------------  Imaging from last 24 hours (if applicable):  Radiology interpretation: DG Chest 2 View  Result Date: 01/04/2022 CLINICAL DATA:   Esophageal cancer, shortness of breath, cough, R05.9, R06.02, smoker EXAM: CHEST - 2 VIEW COMPARISON:  CT angio chest and chest radiographs of 12/15/2021 FINDINGS: LEFT subclavian Port-A-Cath with tip projecting in SVC. Normal heart size, mediastinal contours, and pulmonary vascularity. Lungs clear. No pulmonary infiltrate, pleural effusion, or pneumothorax. Density at the inferior LEFT hemithorax, suspect superimposed artifact, with no pulmonary nodule seen at this site on recent chest radiograph nor CT. Old BILATERAL rib fractures. Gastrostomy tube LEFT upper quadrant. IMPRESSION: No acute abnormalities. Electronically Signed   By: Lavonia Dana M.D.   On: 01/04/2022 09:50   CT Angio Chest PE W and/or Wo Contrast  Result Date: 12/15/2021 CLINICAL DATA:  Palpitation short of breath esophageal cancer EXAM: CT ANGIOGRAPHY CHEST WITH CONTRAST TECHNIQUE: Multidetector CT imaging of the chest was performed using the standard protocol during bolus administration of intravenous contrast. Multiplanar CT image reconstructions and MIPs  were obtained to evaluate the vascular anatomy. RADIATION DOSE REDUCTION: This exam was performed according to the departmental dose-optimization program which includes automated exposure control, adjustment of the mA and/or kV according to patient size and/or use of iterative reconstruction technique. CONTRAST:  56m OMNIPAQUE IOHEXOL 350 MG/ML SOLN COMPARISON:  PET CT 09/22/2021 FINDINGS: Cardiovascular: Satisfactory opacification of the pulmonary arteries to the segmental level. No evidence of pulmonary embolism. Normal heart size. No pericardial effusion. Left-sided central venous port with tip at the cavoatrial region. Nonaneurysmal aorta. Mild atherosclerosis. Mediastinum/Nodes: Midline trachea. No thyroid mass. No increasing adenopathy. Decreased esophageal thickening compared to prior. Lungs/Pleura: No pleural effusion or pneumothorax. Interim finding of spiculated consolidation  within the medial right lung base with cavitation and fluid level, series 6, image 88. This measures 2.5 by 2.3 by 3.1 cm. The previously noted left juxta hilar left upper lobe metastatic nodule has resolved. Upper Abdomen: Reflux of contrast into the IVC and right hepatic vein. Musculoskeletal: No acute osseous abnormality. Review of the MIP images confirms the above findings. IMPRESSION: 1. Negative for acute pulmonary embolism. 2. Decreased left upper lobe juxtahilar nodule compared to prior exam. Interim development of spiculated area of consolidation in the medial right lung base with cavitation and fluid level, findings could be infectious or neoplastic in etiology. 3. Decreased esophageal thickening compared to the prior CT. Aortic Atherosclerosis (ICD10-I70.0). Electronically Signed   By: KDonavan FoilM.D.   On: 12/15/2021 18:16   DG Chest Port 1 View  Result Date: 12/15/2021 CLINICAL DATA:  Provided history: Palpitations. Patient currently being treated for esophageal cancer. EXAM: PORTABLE CHEST 1 VIEW COMPARISON:  Chest radiograph 10/12/2021.  Head CT 09/22/2021. FINDINGS: Left chest infusion port catheter with tip projecting at the level of the mid SVC. Heart size within normal limits. No appreciable airspace consolidation or pulmonary edema. No evidence of pleural effusion or pneumothorax. No acute bony abnormality identified. Redemonstrated chronic bilateral rib fracture deformities. IMPRESSION: No evidence of acute cardiopulmonary abnormality. Electronically Signed   By: KKellie SimmeringD.O.   On: 12/15/2021 14:24      Wrap-Up:    All questions were answered. The patient knows to call the clinic with any problems, questions or concerns.  Medical decision making: Moderate  Time spent on visit: I spent 20 minutes counseling the patient face to face. The total time spent in the appointment was 30 minutes and more than 50% was on counseling.   RHarriett Rush PA-C  01/13/2022 8:23 PM

## 2022-01-11 ENCOUNTER — Inpatient Hospital Stay (HOSPITAL_BASED_OUTPATIENT_CLINIC_OR_DEPARTMENT_OTHER): Payer: 59 | Admitting: Physician Assistant

## 2022-01-11 VITALS — BP 97/80 | HR 110 | Temp 97.4°F | Resp 18 | Wt 93.0 lb

## 2022-01-11 DIAGNOSIS — R634 Abnormal weight loss: Secondary | ICD-10-CM | POA: Diagnosis not present

## 2022-01-11 DIAGNOSIS — Z79899 Other long term (current) drug therapy: Secondary | ICD-10-CM | POA: Diagnosis not present

## 2022-01-11 DIAGNOSIS — Z5111 Encounter for antineoplastic chemotherapy: Secondary | ICD-10-CM | POA: Diagnosis not present

## 2022-01-11 DIAGNOSIS — Z8042 Family history of malignant neoplasm of prostate: Secondary | ICD-10-CM | POA: Diagnosis not present

## 2022-01-11 DIAGNOSIS — R0609 Other forms of dyspnea: Secondary | ICD-10-CM | POA: Diagnosis not present

## 2022-01-11 DIAGNOSIS — R058 Other specified cough: Secondary | ICD-10-CM | POA: Diagnosis not present

## 2022-01-11 DIAGNOSIS — E119 Type 2 diabetes mellitus without complications: Secondary | ICD-10-CM | POA: Diagnosis not present

## 2022-01-11 DIAGNOSIS — R911 Solitary pulmonary nodule: Secondary | ICD-10-CM | POA: Diagnosis not present

## 2022-01-11 DIAGNOSIS — R000 Tachycardia, unspecified: Secondary | ICD-10-CM | POA: Diagnosis not present

## 2022-01-11 DIAGNOSIS — F17210 Nicotine dependence, cigarettes, uncomplicated: Secondary | ICD-10-CM | POA: Diagnosis not present

## 2022-01-11 DIAGNOSIS — R002 Palpitations: Secondary | ICD-10-CM | POA: Diagnosis not present

## 2022-01-11 DIAGNOSIS — C155 Malignant neoplasm of lower third of esophagus: Secondary | ICD-10-CM | POA: Diagnosis not present

## 2022-01-11 DIAGNOSIS — R0602 Shortness of breath: Secondary | ICD-10-CM | POA: Diagnosis not present

## 2022-01-11 DIAGNOSIS — C159 Malignant neoplasm of esophagus, unspecified: Secondary | ICD-10-CM | POA: Diagnosis not present

## 2022-01-11 DIAGNOSIS — Z800 Family history of malignant neoplasm of digestive organs: Secondary | ICD-10-CM | POA: Diagnosis not present

## 2022-01-11 DIAGNOSIS — Z5112 Encounter for antineoplastic immunotherapy: Secondary | ICD-10-CM | POA: Diagnosis not present

## 2022-01-11 DIAGNOSIS — I10 Essential (primary) hypertension: Secondary | ICD-10-CM | POA: Diagnosis not present

## 2022-01-11 NOTE — Patient Instructions (Signed)
[facility] **   You were seen today by Tarri Abernethy PA-C for your follow-up visit.    ESOPHAGEAL CANCER, STAGE IV Your stage IV cancer is incurable.  The point of chemotherapy is to try to shrink the tumor and keep the cancer suppressed for as long as possible to give you more time. We do not yet know if the chemotherapy has worked to shrink the tumor but we will check CT scan, this coming Monday, 01/16/2022 at 7:00 AM. You will discuss these results at your visit with Dr. Delton Coombes on Wednesday, 01/18/2022 at 9:30 AM. If the chemotherapy treatment is not working, or if you are continuing to feel sick and get worse from the chemotherapy, it may be time to transition to hospice. Hospice is a type of medical care for patients with terminal illness that we will focus on relieving your symptoms and supporting you in end-of-life. You are too sick to work right now.  You will likely continue to feel worse as time goes on.  This is the time for you to lean on family and friends around you for support. I will reach out to nutritionist and social worker to call you and address some of your other questions.   - - - - - - - - - - - - - - - - - -    Thank you for choosing [facility] to provide your oncology and hematology care.  To afford each patient quality time with our provider, please arrive at least 15 minutes before your scheduled appointment time.   If you have a lab appointment with the [facility] please come in thru the Main Entrance and check in at the main information desk.  You need to re-schedule your appointment should you arrive 10 or more minutes late.  We strive to give you quality time with our providers, and arriving late affects you and other patients whose appointments are after yours.  Also, if you no show three or more times for appointments you may be  dismissed from the clinic at the providers discretion.     Again, thank you for choosing [facility].  Our hope is that these requests will decrease the amount of time that you wait before being seen by our physicians.       _____________________________________________________________  Should you have questions after your visit to [facility], please contact our office at [phone] and follow the prompts.  Our office hours are 8:00 a.m. and 4:30 p.m. Monday - Friday.  Please note that voicemails left after 4:00 p.m. may not be returned until the following business day.  We are closed weekends and major holidays.  You do have access to a nurse 24-7, just call the main number to the clinic [phone] and do not press any options, hold on the line and a nurse will answer the phone.    For prescription refill requests, have your pharmacy contact our office and allow 72 hours.

## 2022-01-13 ENCOUNTER — Encounter (HOSPITAL_COMMUNITY): Payer: Self-pay | Admitting: Hematology

## 2022-01-13 ENCOUNTER — Other Ambulatory Visit: Payer: Self-pay | Admitting: Family Medicine

## 2022-01-13 ENCOUNTER — Encounter: Payer: Self-pay | Admitting: Hematology

## 2022-01-16 ENCOUNTER — Encounter: Payer: 59 | Admitting: Dietician

## 2022-01-16 ENCOUNTER — Ambulatory Visit: Payer: 59

## 2022-01-16 ENCOUNTER — Ambulatory Visit (HOSPITAL_COMMUNITY): Admission: RE | Admit: 2022-01-16 | Payer: 59 | Source: Ambulatory Visit

## 2022-01-16 ENCOUNTER — Telehealth: Payer: Self-pay | Admitting: Dietician

## 2022-01-16 NOTE — Telephone Encounter (Signed)
Nutrition Follow-up:  Patient with stage IV esophageal cancer. He is receiving FOLFOX + Nivolumab q14d (started 7/5). S/p open G-tube 6/21.  Spoke with patient via telephone. He is feeling tired. Patient reports he is breathing better, cough has improved, and having less mucous since starting medications prescribed on 9/13. Patient did not start taking these until 9/19. States musinex cleared up "all that mucous" and now able to eat small amounts of blended foods and thin liquids by mouth. He reported unable to tolerate any oral intake on 9/18 nutrition f/u. Patient recalls putting frosted flakes in blender with milk. He poured into a bowl and ate like cereal. Patient blends kale, spinach, chicken broth and drinks. He reports having diarrhea after drinking this. He stopped drinking hot chocolate as this makes him vomit. Patient endorses giving 5 cartons of [employer]/Nutren daily. States giving one carton every 3 hours. Patient reports tolerating without nausea, vomiting, diarrhea. He is having 2-3 loose BM's daily.   Medications: reviewed   Labs: no new labs for review  Anthropometrics: Weight 93 lb 0.6 oz on 9/20; 7% decrease in one week - severe  9/13 - 100 lb  8/24 - 105 lb 6.1 oz 8/16 - 105 lb 6.4 oz  7/29 - 121 lb 12.8 oz    Estimated Energy Needs - adjusted 9/25 to reflect current wt 42.2 kg (35-40 g/kg to promote wt gain)  Kcals: 1155-2080 Protein: 76-84 Fluid: >/= 1.5 L  NUTRITION DIAGNOSIS: Severe malnutrition continues    INTERVENTION:  Nutren 1.5 - one 250 ml carton q3h (7AM-7PM) provides 1875 kcal, 85 g protein, 955 ml free water from formula (1555 ml total water with 120 ml flush 5x/day) Meets >100% needs Encouraged oral intake of easy to swallow foods/liquids as tolerated for pleasure    MONITORING, EVALUATION, GOAL: weight trends, tube feedings, intake, goals of care   NEXT VISIT: via telephone in one week

## 2022-01-17 ENCOUNTER — Encounter: Payer: Self-pay | Admitting: Hematology

## 2022-01-17 ENCOUNTER — Other Ambulatory Visit (HOSPITAL_COMMUNITY): Payer: 59

## 2022-01-17 ENCOUNTER — Encounter (HOSPITAL_COMMUNITY): Payer: Self-pay | Admitting: Hematology

## 2022-01-18 ENCOUNTER — Ambulatory Visit (HOSPITAL_COMMUNITY): Payer: 59

## 2022-01-18 ENCOUNTER — Encounter: Payer: Self-pay | Admitting: Hematology

## 2022-01-18 ENCOUNTER — Inpatient Hospital Stay (HOSPITAL_BASED_OUTPATIENT_CLINIC_OR_DEPARTMENT_OTHER): Payer: 59 | Admitting: Hematology

## 2022-01-18 ENCOUNTER — Inpatient Hospital Stay: Payer: 59

## 2022-01-18 ENCOUNTER — Encounter (HOSPITAL_COMMUNITY): Payer: Self-pay | Admitting: Hematology

## 2022-01-18 DIAGNOSIS — C155 Malignant neoplasm of lower third of esophagus: Secondary | ICD-10-CM | POA: Diagnosis not present

## 2022-01-18 DIAGNOSIS — C159 Malignant neoplasm of esophagus, unspecified: Secondary | ICD-10-CM

## 2022-01-18 DIAGNOSIS — R0602 Shortness of breath: Secondary | ICD-10-CM | POA: Diagnosis not present

## 2022-01-18 DIAGNOSIS — F17210 Nicotine dependence, cigarettes, uncomplicated: Secondary | ICD-10-CM | POA: Diagnosis not present

## 2022-01-18 DIAGNOSIS — I10 Essential (primary) hypertension: Secondary | ICD-10-CM | POA: Diagnosis not present

## 2022-01-18 DIAGNOSIS — R058 Other specified cough: Secondary | ICD-10-CM | POA: Diagnosis not present

## 2022-01-18 DIAGNOSIS — R000 Tachycardia, unspecified: Secondary | ICD-10-CM | POA: Diagnosis not present

## 2022-01-18 DIAGNOSIS — R0609 Other forms of dyspnea: Secondary | ICD-10-CM | POA: Diagnosis not present

## 2022-01-18 DIAGNOSIS — R911 Solitary pulmonary nodule: Secondary | ICD-10-CM | POA: Diagnosis not present

## 2022-01-18 DIAGNOSIS — E119 Type 2 diabetes mellitus without complications: Secondary | ICD-10-CM | POA: Diagnosis not present

## 2022-01-18 DIAGNOSIS — J851 Abscess of lung with pneumonia: Secondary | ICD-10-CM | POA: Diagnosis not present

## 2022-01-18 DIAGNOSIS — R634 Abnormal weight loss: Secondary | ICD-10-CM | POA: Diagnosis not present

## 2022-01-18 DIAGNOSIS — J189 Pneumonia, unspecified organism: Secondary | ICD-10-CM | POA: Diagnosis not present

## 2022-01-18 DIAGNOSIS — R002 Palpitations: Secondary | ICD-10-CM | POA: Diagnosis not present

## 2022-01-18 DIAGNOSIS — Z8042 Family history of malignant neoplasm of prostate: Secondary | ICD-10-CM | POA: Diagnosis not present

## 2022-01-18 DIAGNOSIS — Z5112 Encounter for antineoplastic immunotherapy: Secondary | ICD-10-CM | POA: Diagnosis not present

## 2022-01-18 DIAGNOSIS — Z5111 Encounter for antineoplastic chemotherapy: Secondary | ICD-10-CM | POA: Diagnosis not present

## 2022-01-18 DIAGNOSIS — Z800 Family history of malignant neoplasm of digestive organs: Secondary | ICD-10-CM | POA: Diagnosis not present

## 2022-01-18 DIAGNOSIS — Z79899 Other long term (current) drug therapy: Secondary | ICD-10-CM | POA: Diagnosis not present

## 2022-01-18 LAB — CBC WITH DIFFERENTIAL/PLATELET
Abs Immature Granulocytes: 0.01 10*3/uL (ref 0.00–0.07)
Basophils Absolute: 0 10*3/uL (ref 0.0–0.1)
Basophils Relative: 0 %
Eosinophils Absolute: 0 10*3/uL (ref 0.0–0.5)
Eosinophils Relative: 1 %
HCT: 32.6 % — ABNORMAL LOW (ref 39.0–52.0)
Hemoglobin: 11 g/dL — ABNORMAL LOW (ref 13.0–17.0)
Immature Granulocytes: 0 %
Lymphocytes Relative: 43 %
Lymphs Abs: 1.5 10*3/uL (ref 0.7–4.0)
MCH: 25 pg — ABNORMAL LOW (ref 26.0–34.0)
MCHC: 33.7 g/dL (ref 30.0–36.0)
MCV: 74.1 fL — ABNORMAL LOW (ref 80.0–100.0)
Monocytes Absolute: 0.7 10*3/uL (ref 0.1–1.0)
Monocytes Relative: 19 %
Neutro Abs: 1.3 10*3/uL — ABNORMAL LOW (ref 1.7–7.7)
Neutrophils Relative %: 37 %
Platelets: 143 10*3/uL — ABNORMAL LOW (ref 150–400)
RBC: 4.4 MIL/uL (ref 4.22–5.81)
RDW: 22.9 % — ABNORMAL HIGH (ref 11.5–15.5)
WBC Morphology: REACTIVE
WBC: 3.5 10*3/uL — ABNORMAL LOW (ref 4.0–10.5)
nRBC: 0 % (ref 0.0–0.2)

## 2022-01-18 LAB — COMPREHENSIVE METABOLIC PANEL
ALT: 14 U/L (ref 0–44)
AST: 34 U/L (ref 15–41)
Albumin: 3.1 g/dL — ABNORMAL LOW (ref 3.5–5.0)
Alkaline Phosphatase: 60 U/L (ref 38–126)
Anion gap: 10 (ref 5–15)
BUN: 23 mg/dL — ABNORMAL HIGH (ref 6–20)
CO2: 20 mmol/L — ABNORMAL LOW (ref 22–32)
Calcium: 9.1 mg/dL (ref 8.9–10.3)
Chloride: 102 mmol/L (ref 98–111)
Creatinine, Ser: 0.86 mg/dL (ref 0.61–1.24)
GFR, Estimated: >60 mL/min (ref >60)
Glucose, Bld: 90 mg/dL (ref 70–99)
Potassium: 3.6 mmol/L (ref 3.5–5.1)
Sodium: 132 mmol/L — ABNORMAL LOW (ref 135–145)
Total Bilirubin: 1 mg/dL (ref 0.3–1.2)
Total Protein: 7 g/dL (ref 6.5–8.1)

## 2022-01-18 LAB — MAGNESIUM: Magnesium: 1.9 mg/dL (ref 1.7–2.4)

## 2022-01-18 MED ORDER — HEPARIN SOD (PORK) LOCK FLUSH 100 UNIT/ML IV SOLN
500.0000 [IU] | Freq: Once | INTRAVENOUS | Status: AC
  Administered 2022-01-18: 500 [IU] via INTRAVENOUS

## 2022-01-18 MED ORDER — SODIUM CHLORIDE 0.9% FLUSH
10.0000 mL | INTRAVENOUS | Status: DC | PRN
  Administered 2022-01-18: 10 mL via INTRAVENOUS

## 2022-01-18 NOTE — Progress Notes (Signed)
Patient seen and examined, labs reviewed by Dr. Delton Coombes. Hold treatment today. Primary RN and Pharmacy made aware.

## 2022-01-18 NOTE — Patient Instructions (Signed)
[facility]  Discharge Instructions  You were seen and examined today by Dr. Delton Coombes.  Continue your nutrition as you are. You need to gain weight to be back above 100lbs. You are currently 95lbs.  You will need a CT scan prior to next visit.   Thank you for choosing [facility] to provide your oncology and hematology care.   To afford each patient quality time with our provider, please arrive at least 15 minutes before your scheduled appointment time. You may need to reschedule your appointment if you arrive late (10 or more minutes). Arriving late affects you and other patients whose appointments are after yours.  Also, if you miss three or more appointments without notifying the office, you may be dismissed from the clinic at the provider's discretion.    Again, thank you for choosing [facility].  Our hope is that these requests will decrease the amount of time that you wait before being seen by our physicians.   If you have a lab appointment with the [facility] please come in thru the Main Entrance and check in at the main information desk.           _____________________________________________________________  Should you have questions after your visit to [facility], please contact our office at [phone] and follow the prompts.  Our office hours are 8:00 a.m. to 4:30 p.m. Monday - Thursday and 8:00 a.m. to 2:30 p.m. Friday.  Please note that voicemails left after 4:00 p.m. may not be returned until the following business day.  We are closed weekends and all major holidays.  You do have access to a nurse 24-7, just call the main number to the clinic [phone] and do not press any options, hold on the line and a nurse will answer the phone.    For prescription refill requests, have your pharmacy contact our office and allow 72 hours.    Masks are optional in the cancer centers. If you would  like for your care team to wear a mask while they are taking care of you, please let them know. You may have one support person who is at least 53 years old accompany you for your appointments.

## 2022-01-18 NOTE — Progress Notes (Signed)
Pt presents today for treatment today. No treatment today per Dr.K. Pt deaccessed and discharged home.

## 2022-01-18 NOTE — Progress Notes (Signed)
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  PRIOR THERAPY: none  NGS Results: not done  CURRENT THERAPY: FOLFOX + Nivolumab q14d  BRIEF ONCOLOGIC HISTORY:  Oncology History  Squamous cell esophageal cancer ([facility])  09/13/2021 Initial Diagnosis   Squamous cell esophageal cancer ([place] [facility])   10/26/2021 - 12/23/2021 Chemotherapy   Patient is on Treatment Plan : GASTROESOPHAGEAL FOLFOX + Nivolumab q14d     10/26/2021 -  Chemotherapy   Patient is on Treatment Plan : GASTROESOPHAGEAL FOLFOX + Nivolumab q14d       CANCER STAGING:  Cancer Staging  Squamous cell esophageal cancer ([facility]) Staging form: Esophagus - Squamous Cell Carcinoma, AJCC 8th Edition - Clinical stage from 09/13/2021: Stage IVB (cT3, cN1, cM1, G3, L: Lower) - Unsigned - Pathologic: No stage assigned - Unsigned   INTERVAL HISTORY:  Mr. Trevor [name], a 51 y.o. male, seen for follow-up of metastatic esophageal cancer.  He gained about couple of pounds since last visit.  He reports that he is taking in 5 cans of Ensure/Kate Farms through the PEG tube per day.  He is also taking in some chicken broth and fruit juices.  He does report regurgitation of mucus.  He is drinking only water by mouth.  Denies any tingling or numbness in the extremities.  He has been off work and applying for disability.  He lost about 25 to 30 pounds since start of therapy.  REVIEW OF SYSTEMS:  Review of Systems  Constitutional:  Negative for appetite change and fatigue.  HENT:   Negative for trouble swallowing.   Respiratory:  Positive for cough and shortness of breath.   Cardiovascular:  Positive for palpitations.  Gastrointestinal:  Positive for nausea. Negative for diarrhea.  Skin:  Negative for rash.   Neurological:  Negative for numbness.  All other systems reviewed and are negative.   PAST MEDICAL/SURGICAL HISTORY:  Past Medical History:  Diagnosis Date   Diabetes mellitus without complication ([facility])    Esophageal cancer ([facility])    Hypertension    Past Surgical History:  Procedure Laterality Date   BIOPSY  08/26/2021   Procedure: BIOPSY;  Surgeon: Eloise Harman, DO;  Location: [facility];  Service: Endoscopy;;   ESOPHAGOGASTRODUODENOSCOPY (EGD) WITH PROPOFOL N/A 08/26/2021   Procedure: ESOPHAGOGASTRODUODENOSCOPY (EGD) WITH PROPOFOL;  Surgeon: Eloise Harman, DO;  Location: [facility];  Service: Endoscopy;  Laterality: N/A;  2:00pm   GASTROSTOMY N/A 10/12/2021   Procedure: OPEN GASTROSTOMY TUBE;  Surgeon: Aviva Signs, MD;  Location: [facility];  Service: General;  Laterality: N/A;   PORTACATH PLACEMENT Left 10/12/2021   Procedure: INSERTION PORT-A-CATH;  Surgeon: Aviva Signs, MD;  Location: [facility];  Service: General;  Laterality: Left;    SOCIAL HISTORY:  Social History   Socioeconomic History   Marital status: Divorced    Spouse name: Not on file   Number of children: Not on file   Years of education: Not on file   Highest education level: Not on file  Occupational History   Not on file  Tobacco Use   Smoking status: Some Days    Packs/day: 0.50    Types: Cigarettes   Smokeless tobacco: Never  Vaping Use   Vaping Use: Never used  Substance and Sexual  Activity   Alcohol use: Not Currently    Alcohol/week: 14.0 standard drinks of alcohol    Types: 14 Cans of beer per week    Comment: 2- 3 16 oz beer daily.   Drug use: Never   Sexual activity: Not on file  Other Topics Concern   Not on file  Social History Narrative   Not on file   Social Determinants of Health   Financial Resource Strain: High Risk (09/28/2021)   Overall Financial Resource Strain (CARDIA)    Difficulty of Paying Living Expenses: Hard  Food Insecurity: Not on file  Transportation Needs:  Not on file  Physical Activity: Not on file  Stress: Not on file  Social Connections: Not on file  Intimate Partner Violence: Not on file    FAMILY HISTORY:  Family History  Problem Relation Age of Onset   Cancer Father        prostate   Cancer Sister    Colon cancer Neg Hx    Esophageal cancer Neg Hx     CURRENT MEDICATIONS:  Current Outpatient Medications  Medication Sig Dispense Refill   albuterol (VENTOLIN HFA) 108 (90 Base) MCG/ACT inhaler Inhale 1-2 puffs into the lungs every 6 (six) hours as needed for wheezing or shortness of breath. 1 each 0   allopurinol (ZYLOPRIM) 300 MG tablet Take 1 tablet (300 mg total) by mouth daily. 30 tablet 5   amLODipine (NORVASC) 5 MG tablet Take 1 tablet (5 mg total) by mouth daily. 90 tablet 2   amoxicillin-clavulanate (AUGMENTIN) 600-42.9 MG/5ML suspension Place 5 mLs (600 mg total) into feeding tube 2 (two) times daily. 50 mL 0   doxycycline (VIBRAMYCIN) 100 MG capsule Place 1 capsule (100 mg total) into feeding tube 2 (two) times daily. 10 capsule 0   fluorouracil CALGB 86578 2,400 mg/m2 in sodium chloride 0.9 % 150 mL Inject 2,400 mg/m2 into the vein over 48 hr. Every 2 weeks     FLUOROURACIL IV Inject into the vein every 14 (fourteen) days.     ibuprofen (ADVIL) 600 MG tablet Take 1 tablet (600 mg total) by mouth every 8 (eight) hours as needed. 90 tablet 1   KLOR-CON M20 20 MEQ tablet TAKE 2 TABLETS BY MOUTH DAILY 60 tablet 3   LEUCOVORIN CALCIUM IV Inject into the vein every 14 (fourteen) days.     loperamide (IMODIUM) 2 MG capsule Take 1 capsule (2 mg total) by mouth as needed for diarrhea or loose stools (take as directed according to the instruction sheet you were given). 30 capsule 4   magnesium oxide (MAG-OX) 400 (240 Mg) MG tablet TAKE 1 TABLET BY MOUTH TWICE A DAY 60 tablet 1   megestrol (MEGACE) 400 MG/10ML suspension Take 10 mLs (400 mg total) by mouth 2 (two) times daily. 480 mL 3   methylPREDNISolone (MEDROL DOSEPAK) 4 MG TBPK  tablet Take as the package instructed 1 each 0   Misc Natural Products (MULTI-HERB PO) Take by mouth. Turbo max     nicotine (NICODERM CQ - DOSED IN MG/24 HOURS) 21 mg/24hr patch Place 1 patch (21 mg total) onto the skin daily. 28 patch 1   Nutritional Supplements (KATE FARMS STANDARD 1.4) LIQD Give one carton (325 ml) [employer] 1.4 via tube 5x/day. Flush tube with 60 ml water before and after each feeding. Drink by mouth or give via tube additional 2 cups (480 ml) water daily. This provides 2275 kcal, 100 grams protein, 1170 ml free water from formula (2250  ml total water with flushes). Meets 100% needs 1625 mL 10   OXALIPLATIN IV Inject into the vein every 14 (fourteen) days.     prochlorperazine (COMPAZINE) 10 MG tablet Take 1 tablet (10 mg total) by mouth every 6 (six) hours as needed for nausea or vomiting. (Patient not taking: Reported on 01/11/2022) 30 tablet 6   Vitamin D, Ergocalciferol, (DRISDOL) 1.25 MG (50000 UNIT) CAPS capsule Take 1 capsule (50,000 Units total) by mouth every Monday. 12 capsule 0   No current facility-administered medications for this visit.   Facility-Administered Medications Ordered in Other Visits  Medication Dose Route Frequency Provider Last Rate Last Admin   palonosetron (ALOXI) 0.25 MG/5ML injection             ALLERGIES:  No Known Allergies  PHYSICAL EXAM:  Performance status (ECOG): 0 - Asymptomatic  There were no vitals filed for this visit. Wt Readings from Last 3 Encounters:  01/11/22 93 lb 0.6 oz (42.2 kg)  01/04/22 100 lb (45.4 kg)  12/21/21 104 lb 3.2 oz (47.3 kg)   Physical Exam Vitals reviewed.  Constitutional:      Appearance: Normal appearance.  Cardiovascular:     Rate and Rhythm: Normal rate and regular rhythm.     Pulses: Normal pulses.     Heart sounds: Normal heart sounds.  Pulmonary:     Effort: Pulmonary effort is normal.     Breath sounds: Normal breath sounds.  Neurological:     General: No focal deficit present.      Mental Status: He is alert and oriented to person, place, and time.  Psychiatric:        Mood and Affect: Mood normal.        Behavior: Behavior normal.     LABORATORY DATA:  I have reviewed the labs as listed.     Latest Ref Rng & Units 01/04/2022    7:45 AM 12/21/2021    8:13 AM 12/15/2021    2:55 PM  CBC  WBC 4.0 - 10.5 K/uL 5.2  6.2  6.0   Hemoglobin 13.0 - 17.0 g/dL 10.9  10.5  9.7   Hematocrit 39.0 - 52.0 % 33.3  32.1  30.3   Platelets 150 - 400 K/uL 186  179  286       Latest Ref Rng & Units 01/04/2022    7:45 AM 12/21/2021    8:13 AM 12/15/2021    2:55 PM  CMP  Glucose 70 - 99 mg/dL 88  92  100   BUN 6 - 20 mg/dL 33  23  28   Creatinine 0.61 - 1.24 mg/dL 1.14  1.09  1.05   Sodium 135 - 145 mmol/L 134  133  132   Potassium 3.5 - 5.1 mmol/L 3.4  4.3  5.0   Chloride 98 - 111 mmol/L 106  105  105   CO2 22 - 32 mmol/L _0 Calcium 8.9 - 10.3 mg/dL 9.1  9.0  9.1   Total Protein 6.5 - 8.1 g/dL 7.1  7.0  7.1   Total Bilirubin 0.3 - 1.2 mg/dL 0.6  0.8  0.7   Alkaline Phos 38 - 126 U/L 61  65  61   AST 15 - 41 U/L _1 ALT 0 - 44 U/L _2 DIAGNOSTIC IMAGING:  I have independently reviewed the scans and discussed with the patient. DG Chest 2  View  Result Date: 01/04/2022 CLINICAL DATA:  Esophageal cancer, shortness of breath, cough, R05.9, R06.02, smoker EXAM: CHEST - 2 VIEW COMPARISON:  CT angio chest and chest radiographs of 12/15/2021 FINDINGS: LEFT subclavian Port-A-Cath with tip projecting in SVC. Normal heart size, mediastinal contours, and pulmonary vascularity. Lungs clear. No pulmonary infiltrate, pleural effusion, or pneumothorax. Density at the inferior LEFT hemithorax, suspect superimposed artifact, with no pulmonary nodule seen at this site on recent chest radiograph nor CT. Old BILATERAL rib fractures. Gastrostomy tube LEFT upper quadrant. IMPRESSION: No acute abnormalities. Electronically Signed   By: Lavonia Dana M.D.   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 - 34 pound weight loss in the last 2 months. - EGD (08/26/2021): A large fungating ulcerating mass found in the lower third of the esophagus, 30 cm from incisors.  Nearly obstructing and circumferential.  Scope could not be advanced. - Pathology: Invasive moderately to poorly differentiated squamous cell carcinoma. - CT CAP with contrast (09/08/2021): Circumferential irregular wall thickening along the length of approximately 9 cm in the mid and distal thoracic esophagus.  Haziness in the periesophageal fat suggesting transmural extension of the tumor.  Small adjacent 6 mm paraesophageal lymph node.  1.3 cm endobronchial versus peribronchial smooth nodule adjacent to the segmental bronchus within the lingula.  Differential includes intrapulmonary lymph node/endobronchial neoplasm. - PET scan (09/22/2021): Left lung nodule PET positive.  Left deltoid uptake SUV 6.9, left erector spine musculature adjacent to L3 transverse process SUV 8.4, left gluteal activity with SUV 2.97.  Positive for large esophageal mass involving half to one third of the distal esophagus with upper abdominal nodal metastasis. - Left deltoid biopsy (10/13/2021): Keratinizing squamous cell carcinoma - FOLFOX + Nivolumab started on 10/26/2021 - [facility] NGS: PD-L1 CPS 15%, HER2 negative, MSI-stable    Social/family history: - He lives by himself.  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 - Current active smoker, half pack per day for 10 years.  He drinks beer daily. - Father had colon cancer and sister had stomach cancer.   PLAN:  Stage IV (T3N1 M1 G3) lower esophageal squamous cell carcinoma: - He has completed 6 cycles of FOLFOX and nivolumab on 12/25/2021. - He did not have any immunotherapy related side effects.  However his functional status has declined.  He is feeling slightly better today. - I have reviewed his labs  today which showed normal LFTs.  Sodium improved to 132.  CBC was grossly normal with mild neutropenia.  He has missed his CT scan. - I have recommended holding his treatment today.  We will arrange for CT scan for restaging.  I will see him back after the scan.  2.  Weight loss: - He lost total of 25 to 30 pounds since start of therapy.  He was down to 93 pounds on 01/11/2022 but has gained 2 pounds since then.  Continue Ensure/Kate from 5 cans/day per recommendations of dietary.  Aim for a total of 2500 cal/day.   3.  Hypokalemia: - Continue potassium 40 mEq daily.  Potassium today is normal.  4.  Microcytic anemia: - Hemoglobin today improved to 11.  He received Venofer in the past.   Orders placed this encounter:  No orders of the defined types were placed in this encounter.    Derek Jack, MD [place] [phone]

## 2022-01-20 ENCOUNTER — Inpatient Hospital Stay: Payer: 59

## 2022-01-21 ENCOUNTER — Inpatient Hospital Stay (HOSPITAL_COMMUNITY)
Admission: EM | Admit: 2022-01-21 | Discharge: 2022-01-25 | DRG: 166 | Disposition: A | Discharge status: Home Health | Payer: 59 | Attending: Family Medicine | Admitting: Family Medicine

## 2022-01-21 ENCOUNTER — Other Ambulatory Visit: Payer: Self-pay

## 2022-01-21 ENCOUNTER — Emergency Department (HOSPITAL_COMMUNITY): Payer: 59

## 2022-01-21 ENCOUNTER — Encounter (HOSPITAL_COMMUNITY): Payer: Self-pay | Admitting: Emergency Medicine

## 2022-01-21 DIAGNOSIS — K219 Gastro-esophageal reflux disease without esophagitis: Secondary | ICD-10-CM | POA: Diagnosis present

## 2022-01-21 DIAGNOSIS — I10 Essential (primary) hypertension: Secondary | ICD-10-CM | POA: Diagnosis present

## 2022-01-21 DIAGNOSIS — E876 Hypokalemia: Secondary | ICD-10-CM | POA: Diagnosis present

## 2022-01-21 DIAGNOSIS — Z720 Tobacco use: Secondary | ICD-10-CM | POA: Diagnosis not present

## 2022-01-21 DIAGNOSIS — E119 Type 2 diabetes mellitus without complications: Secondary | ICD-10-CM | POA: Diagnosis present

## 2022-01-21 DIAGNOSIS — Z515 Encounter for palliative care: Secondary | ICD-10-CM | POA: Diagnosis not present

## 2022-01-21 DIAGNOSIS — E871 Hypo-osmolality and hyponatremia: Secondary | ICD-10-CM

## 2022-01-21 DIAGNOSIS — J984 Other disorders of lung: Secondary | ICD-10-CM

## 2022-01-21 DIAGNOSIS — Z9221 Personal history of antineoplastic chemotherapy: Secondary | ICD-10-CM

## 2022-01-21 DIAGNOSIS — R0689 Other abnormalities of breathing: Secondary | ICD-10-CM | POA: Diagnosis not present

## 2022-01-21 DIAGNOSIS — R058 Other specified cough: Secondary | ICD-10-CM

## 2022-01-21 DIAGNOSIS — Z931 Gastrostomy status: Secondary | ICD-10-CM

## 2022-01-21 DIAGNOSIS — D61818 Other pancytopenia: Secondary | ICD-10-CM | POA: Diagnosis not present

## 2022-01-21 DIAGNOSIS — J189 Pneumonia, unspecified organism: Secondary | ICD-10-CM | POA: Diagnosis present

## 2022-01-21 DIAGNOSIS — E43 Unspecified severe protein-calorie malnutrition: Secondary | ICD-10-CM | POA: Diagnosis present

## 2022-01-21 DIAGNOSIS — C159 Malignant neoplasm of esophagus, unspecified: Secondary | ICD-10-CM | POA: Diagnosis not present

## 2022-01-21 DIAGNOSIS — R627 Adult failure to thrive: Secondary | ICD-10-CM | POA: Diagnosis not present

## 2022-01-21 DIAGNOSIS — R918 Other nonspecific abnormal finding of lung field: Secondary | ICD-10-CM

## 2022-01-21 DIAGNOSIS — J851 Abscess of lung with pneumonia: Secondary | ICD-10-CM | POA: Diagnosis not present

## 2022-01-21 DIAGNOSIS — I491 Atrial premature depolarization: Secondary | ICD-10-CM | POA: Diagnosis not present

## 2022-01-21 DIAGNOSIS — R0602 Shortness of breath: Secondary | ICD-10-CM | POA: Diagnosis not present

## 2022-01-21 DIAGNOSIS — F17210 Nicotine dependence, cigarettes, uncomplicated: Secondary | ICD-10-CM | POA: Diagnosis present

## 2022-01-21 DIAGNOSIS — C7989 Secondary malignant neoplasm of other specified sites: Secondary | ICD-10-CM | POA: Diagnosis present

## 2022-01-21 DIAGNOSIS — Z79899 Other long term (current) drug therapy: Secondary | ICD-10-CM | POA: Diagnosis not present

## 2022-01-21 DIAGNOSIS — R911 Solitary pulmonary nodule: Secondary | ICD-10-CM | POA: Diagnosis not present

## 2022-01-21 DIAGNOSIS — Z7189 Other specified counseling: Secondary | ICD-10-CM | POA: Diagnosis not present

## 2022-01-21 DIAGNOSIS — Z79890 Hormone replacement therapy (postmenopausal): Secondary | ICD-10-CM | POA: Diagnosis not present

## 2022-01-21 DIAGNOSIS — S2241XD Multiple fractures of ribs, right side, subsequent encounter for fracture with routine healing: Secondary | ICD-10-CM | POA: Diagnosis not present

## 2022-01-21 DIAGNOSIS — Z743 Need for continuous supervision: Secondary | ICD-10-CM | POA: Diagnosis not present

## 2022-01-21 DIAGNOSIS — Z1152 Encounter for screening for COVID-19: Secondary | ICD-10-CM

## 2022-01-21 DIAGNOSIS — D500 Iron deficiency anemia secondary to blood loss (chronic): Secondary | ICD-10-CM | POA: Diagnosis not present

## 2022-01-21 DIAGNOSIS — C7801 Secondary malignant neoplasm of right lung: Secondary | ICD-10-CM | POA: Diagnosis present

## 2022-01-21 DIAGNOSIS — S2249XA Multiple fractures of ribs, unspecified side, initial encounter for closed fracture: Secondary | ICD-10-CM | POA: Diagnosis not present

## 2022-01-21 DIAGNOSIS — D509 Iron deficiency anemia, unspecified: Secondary | ICD-10-CM | POA: Diagnosis present

## 2022-01-21 DIAGNOSIS — R0600 Dyspnea, unspecified: Secondary | ICD-10-CM | POA: Diagnosis not present

## 2022-01-21 DIAGNOSIS — Z681 Body mass index (BMI) 19 or less, adult: Secondary | ICD-10-CM | POA: Diagnosis not present

## 2022-01-21 DIAGNOSIS — D649 Anemia, unspecified: Secondary | ICD-10-CM | POA: Diagnosis not present

## 2022-01-21 DIAGNOSIS — Z809 Family history of malignant neoplasm, unspecified: Secondary | ICD-10-CM | POA: Diagnosis not present

## 2022-01-21 DIAGNOSIS — R1310 Dysphagia, unspecified: Secondary | ICD-10-CM | POA: Diagnosis not present

## 2022-01-21 LAB — BASIC METABOLIC PANEL
Anion gap: 10 (ref 5–15)
BUN: 22 mg/dL — ABNORMAL HIGH (ref 6–20)
CO2: 22 mmol/L (ref 22–32)
Calcium: 9.5 mg/dL (ref 8.9–10.3)
Chloride: 99 mmol/L (ref 98–111)
Creatinine, Ser: 0.98 mg/dL (ref 0.61–1.24)
GFR, Estimated: >60 mL/min (ref >60)
Glucose, Bld: 101 mg/dL — ABNORMAL HIGH (ref 70–99)
Potassium: 4.9 mmol/L (ref 3.5–5.1)
Sodium: 131 mmol/L — ABNORMAL LOW (ref 135–145)

## 2022-01-21 LAB — CBC WITH DIFFERENTIAL/PLATELET
Abs Immature Granulocytes: 0.02 10*3/uL (ref 0.00–0.07)
Basophils Absolute: 0 10*3/uL (ref 0.0–0.1)
Basophils Relative: 1 %
Eosinophils Absolute: 0 10*3/uL (ref 0.0–0.5)
Eosinophils Relative: 0 %
HCT: 35.6 % — ABNORMAL LOW (ref 39.0–52.0)
Hemoglobin: 11.6 g/dL — ABNORMAL LOW (ref 13.0–17.0)
Immature Granulocytes: 1 %
Lymphocytes Relative: 34 %
Lymphs Abs: 1 10*3/uL (ref 0.7–4.0)
MCH: 24.7 pg — ABNORMAL LOW (ref 26.0–34.0)
MCHC: 32.6 g/dL (ref 30.0–36.0)
MCV: 75.7 fL — ABNORMAL LOW (ref 80.0–100.0)
Monocytes Absolute: 0.9 10*3/uL (ref 0.1–1.0)
Monocytes Relative: 31 %
Neutro Abs: 1 10*3/uL — ABNORMAL LOW (ref 1.7–7.7)
Neutrophils Relative %: 33 %
Platelets: 175 10*3/uL (ref 150–400)
RBC: 4.7 MIL/uL (ref 4.22–5.81)
RDW: 23.7 % — ABNORMAL HIGH (ref 11.5–15.5)
WBC: 2.9 10*3/uL — ABNORMAL LOW (ref 4.0–10.5)
nRBC: 0 % (ref 0.0–0.2)

## 2022-01-21 LAB — FERRITIN: Ferritin: 980 ng/mL — ABNORMAL HIGH (ref 24–336)

## 2022-01-21 LAB — IRON AND TIBC
Iron: 30 ug/dL — ABNORMAL LOW (ref 45–182)
Saturation Ratios: 14 % — ABNORMAL LOW (ref 17.9–39.5)
TIBC: 215 ug/dL — ABNORMAL LOW (ref 250–450)
UIBC: 185 ug/dL

## 2022-01-21 LAB — MRSA NEXT GEN BY PCR, NASAL: MRSA by PCR Next Gen: NOT DETECTED

## 2022-01-21 MED ORDER — ACETAMINOPHEN 650 MG RE SUPP: 650.0000 mg | Freq: Four times a day (QID) | RECTAL | Status: DC | PRN

## 2022-01-21 MED ORDER — ALBUTEROL SULFATE (2.5 MG/3ML) 0.083% IN NEBU: 2.5000 mg | INHALATION_SOLUTION | RESPIRATORY_TRACT | Status: DC | PRN

## 2022-01-21 MED ORDER — ACETAMINOPHEN 325 MG PO TABS
650.0000 mg | ORAL_TABLET | Freq: Four times a day (QID) | ORAL | Status: DC | PRN
  Administered 2022-01-21: 650 mg via ORAL
  Filled 2022-01-21: qty 2

## 2022-01-21 MED ORDER — METRONIDAZOLE 500 MG/100ML IV SOLN
500.0000 mg | Freq: Two times a day (BID) | INTRAVENOUS | Status: DC
  Administered 2022-01-21 – 2022-01-24 (×6): 500 mg via INTRAVENOUS
  Filled 2022-01-21 (×6): qty 100

## 2022-01-21 MED ORDER — ALLOPURINOL 300 MG PO TABS
300.0000 mg | ORAL_TABLET | Freq: Every day | ORAL | Status: DC
  Administered 2022-01-21 – 2022-01-25 (×4): 300 mg via ORAL
  Filled 2022-01-21 (×4): qty 1

## 2022-01-21 MED ORDER — LOPERAMIDE HCL 2 MG PO CAPS: 2.0000 mg | ORAL_CAPSULE | ORAL | Status: DC | PRN

## 2022-01-21 MED ORDER — ONDANSETRON HCL 4 MG/2ML IJ SOLN
4.0000 mg | Freq: Four times a day (QID) | INTRAMUSCULAR | Status: DC | PRN
  Filled 2022-01-21: qty 2

## 2022-01-21 MED ORDER — SODIUM CHLORIDE 0.9% FLUSH
3.0000 mL | Freq: Two times a day (BID) | INTRAVENOUS | Status: DC
  Administered 2022-01-21 – 2022-01-25 (×6): 3 mL via INTRAVENOUS

## 2022-01-21 MED ORDER — VANCOMYCIN HCL 750 MG/150ML IV SOLN
750.0000 mg | INTRAVENOUS | Status: DC
  Administered 2022-01-22 – 2022-01-24 (×3): 750 mg via INTRAVENOUS
  Filled 2022-01-21 (×5): qty 150

## 2022-01-21 MED ORDER — SODIUM CHLORIDE 0.9 % IV SOLN
1.0000 g | Freq: Three times a day (TID) | INTRAVENOUS | Status: DC
  Administered 2022-01-21 – 2022-01-25 (×12): 1 g via INTRAVENOUS
  Filled 2022-01-21 (×14): qty 20

## 2022-01-21 MED ORDER — ENOXAPARIN SODIUM 30 MG/0.3ML IJ SOSY
30.0000 mg | PREFILLED_SYRINGE | INTRAMUSCULAR | Status: DC
  Administered 2022-01-21 – 2022-01-22 (×2): 30 mg via SUBCUTANEOUS
  Filled 2022-01-21 (×2): qty 0.3

## 2022-01-21 MED ORDER — NICOTINE 21 MG/24HR TD PT24
21.0000 mg | MEDICATED_PATCH | Freq: Every day | TRANSDERMAL | Status: DC
  Administered 2022-01-21 – 2022-01-25 (×5): 21 mg via TRANSDERMAL
  Filled 2022-01-21 (×5): qty 1

## 2022-01-21 MED ORDER — VITAMIN D (ERGOCALCIFEROL) 1.25 MG (50000 UNIT) PO CAPS
50000.0000 [IU] | ORAL_CAPSULE | ORAL | Status: DC
  Filled 2022-01-21: qty 1

## 2022-01-21 MED ORDER — POLYETHYLENE GLYCOL 3350 17 G PO PACK: 17.0000 g | PACK | Freq: Every day | ORAL | Status: DC | PRN

## 2022-01-21 MED ORDER — VANCOMYCIN HCL IN DEXTROSE 1-5 GM/200ML-% IV SOLN
1000.0000 mg | Freq: Once | INTRAVENOUS | Status: AC
  Administered 2022-01-21: 1000 mg via INTRAVENOUS
  Filled 2022-01-21: qty 200

## 2022-01-21 MED ORDER — LEVOFLOXACIN IN D5W 750 MG/150ML IV SOLN
750.0000 mg | Freq: Once | INTRAVENOUS | Status: AC
  Administered 2022-01-21: 750 mg via INTRAVENOUS
  Filled 2022-01-21: qty 150

## 2022-01-21 MED ORDER — SODIUM CHLORIDE 0.9% FLUSH: 3.0000 mL | INTRAVENOUS | Status: DC | PRN

## 2022-01-21 MED ORDER — BISACODYL 10 MG RE SUPP: 10.0000 mg | Freq: Every day | RECTAL | Status: DC | PRN

## 2022-01-21 MED ORDER — SODIUM CHLORIDE 0.9 % IV SOLN: INTRAVENOUS | Status: DC | PRN

## 2022-01-21 MED ORDER — DM-GUAIFENESIN ER 30-600 MG PO TB12
1.0000 | ORAL_TABLET | Freq: Two times a day (BID) | ORAL | Status: DC
  Administered 2022-01-21 – 2022-01-25 (×8): 1 via ORAL
  Filled 2022-01-21 (×7): qty 1

## 2022-01-21 MED ORDER — IPRATROPIUM-ALBUTEROL 0.5-2.5 (3) MG/3ML IN SOLN
3.0000 mL | Freq: Three times a day (TID) | RESPIRATORY_TRACT | Status: DC
  Administered 2022-01-21 – 2022-01-22 (×3): 3 mL via RESPIRATORY_TRACT
  Filled 2022-01-21 (×3): qty 3

## 2022-01-21 MED ORDER — IOHEXOL 350 MG/ML SOLN
100.0000 mL | Freq: Once | INTRAVENOUS | Status: AC | PRN
  Administered 2022-01-21: 100 mL via INTRAVENOUS

## 2022-01-21 MED ORDER — ONDANSETRON HCL 4 MG PO TABS: 4.0000 mg | ORAL_TABLET | Freq: Four times a day (QID) | ORAL | Status: DC | PRN

## 2022-01-21 MED ORDER — MEGESTROL ACETATE 400 MG/10ML PO SUSP
400.0000 mg | Freq: Two times a day (BID) | ORAL | Status: DC
  Administered 2022-01-21 – 2022-01-25 (×7): 400 mg via ORAL
  Filled 2022-01-21 (×10): qty 10

## 2022-01-21 MED ORDER — KATE FARMS STANDARD 1.4 PO LIQD
237.0000 mL | Freq: Every day | ORAL | Status: DC
  Administered 2022-01-21 – 2022-01-22 (×3): 237 mL
  Filled 2022-01-21: qty 325

## 2022-01-21 NOTE — Progress Notes (Signed)
Patient arrived at the unit,CHG bath given,vitals checked,CCMD notified.care continues.

## 2022-01-21 NOTE — Progress Notes (Addendum)
Pharmacy Antibiotic Note  T[name] a 51 y.o. male admitted on 01/21/2022 with cavitary pneumonia and lung abscesses.  Pharmacy has been consulted for vancomycin and merrem dosing.   Plan: Vancomycin '750mg'$  IV every 24 hours.  Goal trough 15-20 mcg/mL. Merrem 1gm iv q8h  Medical History: Past Medical History:  Diagnosis Date   Diabetes mellitus without complication ([facility])    Esophageal cancer ([facility])    Hypertension     Allergies:  No Known Allergies  Filed Weights   01/21/22 0225  Weight: 43 kg (94 lb 12.8 oz)       Latest Ref Rng & Units 01/21/2022    2:55 AM 01/18/2022    8:28 AM 01/04/2022    7:45 AM  CBC  WBC 4.0 - 10.5 K/uL 2.9  3.5  5.2   Hemoglobin 13.0 - 17.0 g/dL 11.6  11.0  10.9   Hematocrit 39.0 - 52.0 % 35.6  32.6  33.3   Platelets 150 - 400 K/uL 175  143  186      Estimated Creatinine Clearance: 53 mL/min (by C-G formula based on SCr of 0.98 mg/dL).  Antibiotics Given (last 72 hours)     Date/Time Action Medication Dose Rate   01/21/22 0554 New Bag/Given   levofloxacin (LEVAQUIN) IVPB 750 mg 750 mg 100 mL/hr       Antimicrobials this admission:  merrem 01/21/2022  >>  vancomycin 01/21/2022  >>  Metronidazole 01/21/2022   x 1  Levofloxacin x 1 01/21/2022 Microbiology results: 01/21/2022  BCx: sent 01/21/2022  Resp Panel: sent  01/21/2022  MRSA PCR: sent  Thank you for allowing pharmacy to be a part of this patient's care.  Thomasenia Sales, PharmD Clinical Pharmacist

## 2022-01-21 NOTE — H&P (Signed)
Patient Demographics:    Trevor [name], is a 51 y.o. male  MRN: [id number]   DOB - 07-23-1970  Admit Date - 01/21/2022  Outpatient Primary MD for the patient is [name], [name]   Assessment & Plan:   Assessment and Plan:  1)Cavitary PNA/?? Lung Abscess--failed outpatient doxycycline and Augmentin -EDP gave Levaquin on 01/21/2022 --WBC is down to 2.9 from 6.2 a month ago --No significant hypoxia --despite treatment with 2 weeks of doxycycline and 2 weeks of Augmentin clinically and radiologically his cavitary pneumonia has worsened -Patient has worsening leukopenia we will treat empirically with vancomycin, Flagyl and meropenem to cover for Staph and anaerobes -Bronchodilators and mucolytics as ordered -Blood cultures pending -Sputum culture requested --Get pulmonology consult -Consider infectious disease consult In the ED-- repeat CTA chest on 01/21/2022 shows-Worsening airspace disease in the right lower lobe, rapid change and associated low-density airway filling suggesting endobronchial spread of infection. Pre-existing cavitary opacity in the right lower lobe has enlarged with cavitary focus now measuring 2 cm; affected airways are in close proximity to the cavity. Underlying pulmonary mass is not excluded in the setting of stage IV esophageal cancer.  2)Dysphagia/FTT--status post PEG tube placement in the setting of esophageal cancer with dysphagia and odynophagia -Continue tube feeding--Give one carton (325 ml) [employer] 1.4 via tube 5x/day. Flush tube with 60 ml water before and after each feeding. Drink by mouth or give via tube additional 2 cups (480 ml) water daily. This provides 2275 kcal, 100 grams protein, 1170 ml free water from formula (2250 ml total water with flushes).    -Get dietitian  consult -Okay to take oral liquids as tolerated -Megace for appetite stimulation  3)Hyponatremia----?? 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 Delton Coombes for chemo which is currently on hold given acute infection - 6)Tobacco abuse--nicotine patch as ordered  7)Social/ethics--- patient is a full code -Palliative consult requested given comorbidities  Disposition/Need for in-Hospital Stay- patient unable to be discharged at this time due to --worsening cavitary pneumonia requiring broad-spectrum IV antibiotics and possible pulmonology intervention/possible bronch-- -transferring to [facility] for further pulmonology and ID input  Status is: Inpatient  Remains inpatient appropriate because:   Dispo: The patient is from: Home              Anticipated d/c is to: Home              Anticipated d/c date is: > 3 days  Patient currently is not medically stable to d/c. Barriers: Not Clinically Stable-   With History of - Reviewed by me  Past Medical History:  Diagnosis Date   Diabetes mellitus without complication ([place])    Esophageal cancer ([place])    Hypertension       Past Surgical History:  Procedure Laterality Date   BIOPSY  08/26/2021   Procedure: BIOPSY;  Surgeon: Eloise Harman, DO;  Location: [facility];  Service: Endoscopy;;   ESOPHAGOGASTRODUODENOSCOPY (EGD) WITH PROPOFOL N/A 08/26/2021   Procedure:  ESOPHAGOGASTRODUODENOSCOPY (EGD) WITH PROPOFOL;  Surgeon: Eloise Harman, DO;  Location: [facility];  Service: Endoscopy;  Laterality: N/A;  2:00pm   GASTROSTOMY N/A 10/12/2021   Procedure: OPEN GASTROSTOMY TUBE;  Surgeon: Aviva Signs, MD;  Location: [facility];  Service: General;  Laterality: N/A;   PORTACATH PLACEMENT Left 10/12/2021   Procedure: INSERTION PORT-A-CATH;  Surgeon: Aviva Signs, MD;  Location: [facility];  Service: General;  Laterality: Left;    Chief Complaint  Patient presents with   Shortness of Breath      HPI:    Trevor [name]  is a 51 y.o. male with past medical history relevant for tobacco abuse, alcohol abuse (quit in the summer 2023), HTN, stage IVb esophageal cancer with mets, status post PEG tube placement, failure to thrive/malnutrition who was diagnosed with cavitary pneumonia 12/15/2021 and treated with Augmentin and doxycycline.... By his oncologist Dr. Delton Coombes -Respiratory symptoms have persisted, reports productive cough, chills , fatigue and malaise as well as  significant dyspnea with minimal activity despite bronchodilator use -No frank chest pains no pleuritic symptoms -No vomiting or diarrhea -No significant hypoxia -In the ED repeat CTA chest on 01/21/2022 shows-Worsening airspace disease in the right lower lobe, rapid change and associated low-density airway filling suggesting endobronchial spread of infection. Pre-existing cavitary opacity in the right lower lobe has enlarged with cavitary focus now measuring 2 cm; affected airways are in close proximity to the cavity. Underlying pulmonary mass is not excluded in the setting of stage IV esophageal cancer. - -EDP gave Levaquin -Sodium is 131, creatinine 0.98 -WBC is down to 2.9 from 6.2 a month ago -Hemoglobin is 11.6 which is around patient's prior baseline, low MCV and MCH noted, platelets 175   Review of systems:    In addition to the HPI above,   A full Review of  Systems was done, all other  systems reviewed are negative except as noted above in HPI , .    Social History:  Reviewed by me    Social History   Tobacco Use   Smoking status: Some Days    Packs/day: 0.50    Types: Cigarettes   Smokeless tobacco: Never  Substance Use Topics   Alcohol use: Not Currently    Alcohol/week: 14.0 standard drinks of alcohol    Types: 14 Cans of beer per week    Comment: 2- 3 16 oz beer daily.     Family History :  Reviewed by me   Family History  Problem Relation Age of Onset   Cancer Father        prostate   Cancer Sister    Colon cancer Neg Hx    Esophageal cancer Neg Hx     Home Medications:   Prior to Admission medications   Medication Sig Start Date End Date Taking? Authorizing Provider  albuterol (VENTOLIN HFA) 108 (90 Base) MCG/ACT inhaler Inhale 1-2 puffs into the lungs every 6 (six) hours as needed  for wheezing or shortness of breath. 01/04/22  Yes Pennington, Rebekah M, PA-C  allopurinol (ZYLOPRIM) 300 MG tablet Take 1 tablet (300 mg total) by mouth daily. 08/04/21  Yes Sanjuana Kava, MD  amLODipine (NORVASC) 5 MG tablet Take 1 tablet (5 mg total) by mouth daily. 11/28/21  Yes [name], [name]  amoxicillin-clavulanate (AUGMENTIN) 600-42.9 MG/5ML suspension Place 5 mLs (600 mg total) into feeding tube 2 (two) times daily. 01/04/22  Yes Pennington, Rebekah M, PA-C  doxycycline (VIBRAMYCIN) 100 MG capsule Place 1 capsule (100 mg total) into feeding tube 2 (two) times daily. 01/04/22  Yes Harriett Rush, PA-C  fluorouracil CALGB 75643 2,400 mg/m2 in sodium chloride 0.9 % 150 mL Inject 2,400 mg/m2 into the vein over 48 hr. Every 2 weeks   Yes [provider]  FLUOROURACIL IV Inject into the vein every 14 (fourteen) days.   Yes [provider]  KLOR-CON M20 20 MEQ tablet TAKE 2 TABLETS BY MOUTH DAILY 12/19/21  Yes Derek Jack, MD  LEUCOVORIN CALCIUM IV Inject into the vein every 14 (fourteen) days.   Yes [provider]   loperamide (IMODIUM) 2 MG capsule Take 1 capsule (2 mg total) by mouth as needed for diarrhea or loose stools (take as directed according to the instruction sheet you were given). 10/04/21  Yes Derek Jack, MD  magnesium oxide (MAG-OX) 400 (240 Mg) MG tablet TAKE 1 TABLET BY MOUTH TWICE A DAY Patient taking differently: Take 400 mg by mouth 2 (two) times daily. 01/13/22  Yes [name], [name]  megestrol (MEGACE) 400 MG/10ML suspension Take 10 mLs (400 mg total) by mouth 2 (two) times daily. 12/07/21  Yes Derek Jack, MD  Nutritional Supplements ([employer] STANDARD 1.4) LIQD Give one carton (325 ml) [employer] 1.4 via tube 5x/day. Flush tube with 60 ml water before and after each feeding. Drink by mouth or give via tube additional 2 cups (480 ml) water daily. This provides 2275 kcal, 100 grams protein, 1170 ml free water from formula (2250 ml total water with flushes). Meets 100% needs 11/21/21  Yes Derek Jack, MD  OXALIPLATIN IV Inject into the vein every 14 (fourteen) days.   Yes [provider]  prochlorperazine (COMPAZINE) 10 MG tablet Take 1 tablet (10 mg total) by mouth every 6 (six) hours as needed for nausea or vomiting. 10/27/21  Yes Derek Jack, MD  Vitamin D, Ergocalciferol, (DRISDOL) 1.25 MG (50000 UNIT) CAPS capsule Take 1 capsule (50,000 Units total) by mouth every Monday. 11/28/21  Yes [name], [name]  ibuprofen (ADVIL) 600 MG tablet Take 1 tablet (600 mg total) by mouth every 8 (eight) hours as needed. Patient not taking: Reported on 01/21/2022 12/28/21   Derek Jack, MD  methylPREDNISolone (MEDROL DOSEPAK) 4 MG TBPK tablet Take as the package instructed Patient not taking: Reported on 01/21/2022 11/28/21   [name], [name]  nicotine (NICODERM CQ - DOSED IN MG/24 HOURS) 21 mg/24hr patch Place 1 patch (21 mg total) onto the skin daily. Patient not taking: Reported on 01/21/2022 10/27/21   Derek Jack, MD     Allergies:     No Known Allergies   Physical Exam:   Vitals  Blood pressure 96/74, pulse 80, temperature (!) 97.5 F (36.4 C), temperature source Oral, resp. rate 17, height '5\' 4"'$  (1.626 m), weight 43 kg, SpO2 100 %.  Physical Examination: General appearance - alert,  in no distress , frail and cachectic appearing Mental status - alert, oriented to person, place, and time,  Eyes - sclera anicteric Neck - supple, no JVD elevation , Chest -diminished breath sounds with scattered rhonchi bilaterally  heart - S1 and S2 normal, regular  Abdomen - soft, nontender, nondistended, +BS, PEG tube in situ Neurological - screening mental status exam normal, neck supple without rigidity, cranial nerves II through XII intact, DTR's normal and symmetric Extremities - no pedal edema noted, intact peripheral pulses  Skin - warm, dry    Data Review:    CBC Recent Labs  Lab 01/18/22 0828 01/21/22 0255  WBC 3.5* 2.9*  HGB 11.0* 11.6*  HCT 32.6* 35.6*  PLT 143* 175  MCV 74.1* 75.7*  MCH 25.0* 24.7*  MCHC 33.7 32.6  RDW 22.9* 23.7*  LYMPHSABS 1.5 1.0  MONOABS 0.7 0.9  EOSABS 0.0 0.0  BASOSABS 0.0 0.0   ------------------------------------------------------------------------------------------------------------------  Chemistries  Recent Labs  Lab 01/18/22 0828 01/21/22 0255  NA 132* 131*  K 3.6 4.9  CL 102 99  CO2 20* 22  GLUCOSE 90 101*  BUN 23* 22*  CREATININE 0.86 0.98  CALCIUM 9.1 9.5  MG 1.9  --   AST 34  --   ALT 14  --   ALKPHOS 60  --   BILITOT 1.0  --    ------------------------------------------------------------------------------------------------------------------ estimated creatinine clearance is 53 mL/min (by C-G formula based on SCr of 0.98  mg/dL). ------------------------------------------------------------------------------------------------------------------  ----------------------------------------------------------------------------------------------------------------   Imaging Results:    CT Angio Chest PE W/Cm &/Or Wo Cm  Result Date: 01/21/2022 CLINICAL DATA:  Difficulty breathing.  Esophageal cancer. EXAM: CT ANGIOGRAPHY CHEST WITH CONTRAST TECHNIQUE: Multidetector CT imaging of the chest was performed using the standard protocol during bolus administration of intravenous contrast. Multiplanar CT image reconstructions and MIPs were obtained to evaluate the vascular anatomy. RADIATION DOSE REDUCTION: This exam was performed according to the departmental dose-optimization program which includes automated exposure control, adjustment of the mA and/or kV according to patient size and/or use of iterative reconstruction technique. CONTRAST:  176m OMNIPAQUE IOHEXOL 350 MG/ML SOLN COMPARISON:  12/15/2021 FINDINGS: Cardiovascular: Satisfactory opacification of the pulmonary arteries to the segmental level. No evidence of pulmonary embolism. Normal heart size. No pericardial effusion. Porta catheter from the left in unremarkable position. Mediastinum/Nodes: Increased size of a right paratracheal node, 8 mm in short axis, possibly reactive in this setting. Similar size subcarinal node. Unchanged appearance of the esophagus. Lungs/Pleura: Patchy opacity in the right lower lobe, 1 showing tiny focus of cavitation. Pre-existing cavitary focus with fluid level in the medial right lower lobe measures 20 mm at the fluid collection enlarged. Adjacent right lower lobe airways show low-density opacification. No pleural effusion or pneumothorax. Upper Abdomen: No acute finding Musculoskeletal: Numerous remote right rib fractures with hypertrophic healing. No acute or aggressive finding. Review of the MIP images confirms the above findings. IMPRESSION: 1.  Negative for pulmonary embolism. 2. Worsening airspace disease in the right lower lobe, rapid change and associated low-density airway filling suggesting endobronchial spread of infection. Pre-existing cavitary opacity in the right lower lobe has enlarged with cavitary focus now measuring 2 cm; affected airways are in close proximity to the cavity. Underlying pulmonary mass is not excluded in the setting of stage IV esophageal cancer. Electronically Signed   By: JJorje GuildM.D.   On: 01/21/2022 05:04   DG Chest Portable 1 View  Result Date: 01/21/2022 CLINICAL DATA:  Shortness of breath EXAM: PORTABLE CHEST 1 VIEW COMPARISON:  01/04/2022 FINDINGS: Left-sided chest port is again identified and stable. Lungs are well aerated bilaterally.  Healed rib fractures are noted bilaterally. Cardiac shadow is within normal limits. IMPRESSION: No acute abnormality noted. Electronically Signed   By: Inez Catalina M.D.   On: 01/21/2022 02:52    Radiological Exams on Admission: CT Angio Chest PE W/Cm &/Or Wo Cm  Result Date: 01/21/2022 CLINICAL DATA:  Difficulty breathing.  Esophageal cancer. EXAM: CT ANGIOGRAPHY CHEST WITH CONTRAST TECHNIQUE: Multidetector CT imaging of the chest was performed using the standard protocol during bolus administration of intravenous contrast. Multiplanar CT image reconstructions and MIPs were obtained to evaluate the vascular anatomy. RADIATION DOSE REDUCTION: This exam was performed according to the departmental dose-optimization program which includes automated exposure control, adjustment of the mA and/or kV according to patient size and/or use of iterative reconstruction technique. CONTRAST:  168m OMNIPAQUE IOHEXOL 350 MG/ML SOLN COMPARISON:  12/15/2021 FINDINGS: Cardiovascular: Satisfactory opacification of the pulmonary arteries to the segmental level. No evidence of pulmonary embolism. Normal heart size. No pericardial effusion. Porta catheter from the left in unremarkable  position. Mediastinum/Nodes: Increased size of a right paratracheal node, 8 mm in short axis, possibly reactive in this setting. Similar size subcarinal node. Unchanged appearance of the esophagus. Lungs/Pleura: Patchy opacity in the right lower lobe, 1 showing tiny focus of cavitation. Pre-existing cavitary focus with fluid level in the medial right lower lobe measures 20 mm at the fluid collection enlarged. Adjacent right lower lobe airways show low-density opacification. No pleural effusion or pneumothorax. Upper Abdomen: No acute finding Musculoskeletal: Numerous remote right rib fractures with hypertrophic healing. No acute or aggressive finding. Review of the MIP images confirms the above findings. IMPRESSION: 1. Negative for pulmonary embolism. 2. Worsening airspace disease in the right lower lobe, rapid change and associated low-density airway filling suggesting endobronchial spread of infection. Pre-existing cavitary opacity in the right lower lobe has enlarged with cavitary focus now measuring 2 cm; affected airways are in close proximity to the cavity. Underlying pulmonary mass is not excluded in the setting of stage IV esophageal cancer. Electronically Signed   By: JJorje GuildM.D.   On: 01/21/2022 05:04   DG Chest Portable 1 View  Result Date: 01/21/2022 CLINICAL DATA:  Shortness of breath EXAM: PORTABLE CHEST 1 VIEW COMPARISON:  01/04/2022 FINDINGS: Left-sided chest port is again identified and stable. Lungs are well aerated bilaterally. Healed rib fractures are noted bilaterally. Cardiac shadow is within normal limits. IMPRESSION: No acute abnormality noted. Electronically Signed   By: MInez CatalinaM.D.   On: 01/21/2022 02:52    DVT Prophylaxis -SCD/Lovenox AM Labs Ordered, also please review Full Orders  Family Communication: Admission, patients condition and plan of care including tests being ordered have been discussed with the patient  who indicate understanding and agree with the  plan   Condition -stable  CRoxan HockeyM.D on 01/21/2022 at 1:08 PM Go to [email or web address] -  for contact info  [facility] - Office  [phone]

## 2022-01-21 NOTE — ED Triage Notes (Signed)
Pt with c/o breathing difficulties. Per EMS, pt's O2 sats were 93% on R/A upon their arrival and they placed pt on O2 @ 2L [place]. Pt with esophageal cancer.

## 2022-01-21 NOTE — ED Provider Notes (Signed)
[facility] EMERGENCY DEPARTMENT  Provider Note  CSN: [id number] Arrival date & time: 01/21/22 5102  History Chief Complaint  Patient presents with   Shortness of Breath    Trevor [name] is a 51 y.o. 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 Has recently had persistent productive cough. Seen in the ED 8/24 where CT showed a mass vs cavitary pneumonia in R lung base. He was given Abx then and again at an oncology office visit on 9/13 without improvement. He reports tonight while watching TV he began coughing and could not catch his breath. He ended up calling EMS who reported his SpO2 was 93%. He was started on supplemental oxygen and was feeling better. No reported wheezing. He does not carry a diagnosis of COPD but oncology gave him an inhaler earlier in the month, he hasn't noticed that it has helped any.    Home Medications Prior to Admission medications   Medication Sig Start Date End Date Taking? Authorizing Provider  albuterol (VENTOLIN HFA) 108 (90 Base) MCG/ACT inhaler Inhale 1-2 puffs into the lungs every 6 (six) hours as needed for wheezing or shortness of breath. 01/04/22   Harriett Rush, PA-C  allopurinol (ZYLOPRIM) 300 MG tablet Take 1 tablet (300 mg total) by mouth daily. 08/04/21   Sanjuana Kava, MD  amLODipine (NORVASC) 5 MG tablet Take 1 tablet (5 mg total) by mouth daily. 11/28/21   [name], [name]  amoxicillin-clavulanate (AUGMENTIN) 600-42.9 MG/5ML suspension Place 5 mLs (600 mg total) into feeding tube 2 (two) times daily. 01/04/22   Harriett Rush, PA-C  doxycycline (VIBRAMYCIN) 100 MG capsule Place 1 capsule (100 mg total) into feeding tube 2 (two) times daily. 01/04/22   Harriett Rush, PA-C  fluorouracil CALGB 58527 2,400 mg/m2 in sodium chloride 0.9 % 150 mL Inject 2,400 mg/m2 into the vein over 48 hr. Every 2 weeks    [provider]  FLUOROURACIL IV Inject into the vein every  14 (fourteen) days.    [provider]  ibuprofen (ADVIL) 600 MG tablet Take 1 tablet (600 mg total) by mouth every 8 (eight) hours as needed. 12/28/21   Derek Jack, MD  KLOR-CON M20 20 MEQ tablet TAKE 2 TABLETS BY MOUTH DAILY 12/19/21   Derek Jack, MD  LEUCOVORIN CALCIUM IV Inject into the vein every 14 (fourteen) days.    [provider]  loperamide (IMODIUM) 2 MG capsule Take 1 capsule (2 mg total) by mouth as needed for diarrhea or loose stools (take as directed according to the instruction sheet you were given). 10/04/21   Derek Jack, MD  magnesium oxide (MAG-OX) 400 (240 Mg) MG tablet TAKE 1 TABLET BY MOUTH TWICE A DAY 01/13/22   [name], [name]  megestrol (MEGACE) 400 MG/10ML suspension Take 10 mLs (400 mg total) by mouth 2 (two) times daily. 12/07/21   Derek Jack, MD  methylPREDNISolone (MEDROL DOSEPAK) 4 MG TBPK tablet Take as the package instructed 11/28/21   [name], [name]  Misc Natural Products (MULTI-HERB PO) Take by mouth. Turbo max    [provider]  nicotine (NICODERM CQ - DOSED IN MG/24 HOURS) 21 mg/24hr patch Place 1 patch (21 mg total) onto the skin daily. 10/27/21   Derek Jack, MD  Nutritional Supplements (KATE FARMS STANDARD 1.4) LIQD Give one carton (325 ml) [employer] 1.4 via tube 5x/day. Flush tube with 60 ml water before and after each feeding. Drink by mouth  or give via tube additional 2 cups (480 ml) water daily. This provides 2275 kcal, 100 grams protein, 1170 ml free water from formula (2250 ml total water with flushes). Meets 100% needs 11/21/21   Derek Jack, MD  OXALIPLATIN IV Inject into the vein every 14 (fourteen) days.    [provider]  prochlorperazine (COMPAZINE) 10 MG tablet Take 1 tablet (10 mg total) by mouth every 6 (six) hours as needed for nausea or vomiting. 10/27/21   Derek Jack, MD  Vitamin D, Ergocalciferol, (DRISDOL) 1.25 MG (50000 UNIT) CAPS capsule  Take 1 capsule (50,000 Units total) by mouth every Monday. 11/28/21   [name], [place]     Allergies    Patient has no known allergies.   Review of Systems   Review of Systems Please see HPI for pertinent positives and negatives  Physical Exam BP 115/87 (BP Location: Left Arm)   Pulse 84   Temp 98.2 F (36.8 C) (Oral)   Resp 20   Ht '5\' 4"'$  (1.626 m)   Wt 43 kg   SpO2 100%   BMI 16.27 kg/m   Physical Exam Vitals and nursing note reviewed.  Constitutional:      Appearance: Normal appearance.     Comments: thin  HENT:     Head: Normocephalic and atraumatic.     Nose: Nose normal.     Mouth/Throat:     Mouth: Mucous membranes are moist.  Eyes:     Extraocular Movements: Extraocular movements intact.     Conjunctiva/sclera: Conjunctivae normal.  Cardiovascular:     Rate and Rhythm: Normal rate.  Pulmonary:     Effort: Pulmonary effort is normal.     Breath sounds: Decreased breath sounds present. No wheezing or rales.  Abdominal:     General: Abdomen is flat.     Palpations: Abdomen is soft.     Tenderness: There is no abdominal tenderness.  Musculoskeletal:        General: No swelling. Normal range of motion.     Cervical back: Neck supple.     Right lower leg: No edema.     Left lower leg: No edema.  Skin:    General: Skin is warm and dry.  Neurological:     General: No focal deficit present.     Mental Status: He is alert.  Psychiatric:        Mood and Affect: Mood normal.     ED Results / Procedures / Treatments   EKG None  Procedures Procedures  Medications Ordered in the ED Medications  levofloxacin (LEVAQUIN) IVPB 750 mg (750 mg Intravenous New Bag/Given 01/21/22 0554)  iohexol (OMNIPAQUE) 350 MG/ML injection 100 mL (100 mLs Intravenous Contrast Given 01/21/22 0426)    Initial Impression and Plan  Patient with persistent productive cough and SOB. No fevers tonight. He denies any chest pain. Feeling better now SpO2 is 100% on RA. Will check  labs, send for CT to re-evaluate his prior mass vs pneumonia. Currently NSR on monitor.   ED Course   Clinical Course as of 01/21/22 0612  Sat Jan 21, 2022  0328 BMP is unremarkable.  [CS]  7062 CBC with borderline neutropenia, slightly worsened from previous.  [CS]  0530 I personally viewed the images from radiology studies and agree with radiologist interpretation: CTA is neg for PE but there is worsening airspace disease and the previously seen cavitary lesion is larger. Given his continued symptoms and borderline neutropenia, will begin Levaquin and discuss admission  with hospitalist.  [CS]  [phone] Spoke with Dr. Clearence Ped, Hospitalist, who will evaluate the patient for admission.  [CS]    Clinical Course User Index [CS] Truddie Hidden, MD     MDM Rules/Calculators/A&P Medical Decision Making Problems Addressed: Community acquired pneumonia of right lower lobe of lung: acute illness or injury  Amount and/or Complexity of Data Reviewed Labs: ordered. Decision-making details documented in ED Course. Radiology: ordered and independent interpretation performed. Decision-making details documented in ED Course. ECG/medicine tests: ordered and independent interpretation performed. Decision-making details documented in ED Course.  Risk Prescription drug management. Decision regarding hospitalization.    Final Clinical Impression(s) / ED Diagnoses Final diagnoses:  Community acquired pneumonia of right lower lobe of lung    Rx / DC Orders ED Discharge Orders     None        Truddie Hidden, MD 01/21/22 [phone]

## 2022-01-22 DIAGNOSIS — R1310 Dysphagia, unspecified: Secondary | ICD-10-CM

## 2022-01-22 DIAGNOSIS — E43 Unspecified severe protein-calorie malnutrition: Secondary | ICD-10-CM | POA: Diagnosis not present

## 2022-01-22 DIAGNOSIS — J984 Other disorders of lung: Secondary | ICD-10-CM | POA: Diagnosis not present

## 2022-01-22 DIAGNOSIS — C159 Malignant neoplasm of esophagus, unspecified: Secondary | ICD-10-CM

## 2022-01-22 DIAGNOSIS — Z7189 Other specified counseling: Secondary | ICD-10-CM

## 2022-01-22 DIAGNOSIS — J189 Pneumonia, unspecified organism: Secondary | ICD-10-CM | POA: Diagnosis not present

## 2022-01-22 DIAGNOSIS — Z515 Encounter for palliative care: Secondary | ICD-10-CM

## 2022-01-22 LAB — RENAL FUNCTION PANEL
Albumin: 2.1 g/dL — ABNORMAL LOW (ref 3.5–5.0)
Anion gap: 10 (ref 5–15)
BUN: 16 mg/dL (ref 6–20)
CO2: 18 mmol/L — ABNORMAL LOW (ref 22–32)
Calcium: 8.9 mg/dL (ref 8.9–10.3)
Chloride: 103 mmol/L (ref 98–111)
Creatinine, Ser: 0.86 mg/dL (ref 0.61–1.24)
GFR, Estimated: >60 mL/min (ref >60)
Glucose, Bld: 89 mg/dL (ref 70–99)
Phosphorus: 2.7 mg/dL (ref 2.5–4.6)
Potassium: 3.8 mmol/L (ref 3.5–5.1)
Sodium: 131 mmol/L — ABNORMAL LOW (ref 135–145)

## 2022-01-22 LAB — CBC
HCT: 26.5 % — ABNORMAL LOW (ref 39.0–52.0)
Hemoglobin: 8.9 g/dL — ABNORMAL LOW (ref 13.0–17.0)
MCH: 25.3 pg — ABNORMAL LOW (ref 26.0–34.0)
MCHC: 33.6 g/dL (ref 30.0–36.0)
MCV: 75.3 fL — ABNORMAL LOW (ref 80.0–100.0)
Platelets: 143 10*3/uL — ABNORMAL LOW (ref 150–400)
RBC: 3.52 MIL/uL — ABNORMAL LOW (ref 4.22–5.81)
RDW: 22.8 % — ABNORMAL HIGH (ref 11.5–15.5)
WBC: 3.4 10*3/uL — ABNORMAL LOW (ref 4.0–10.5)
nRBC: 0 % (ref 0.0–0.2)

## 2022-01-22 LAB — FOLATE: Folate: >40 ng/mL (ref >5.9)

## 2022-01-22 LAB — VITAMIN B12: Vitamin B-12: 1295 pg/mL — ABNORMAL HIGH (ref 180–914)

## 2022-01-22 LAB — SARS CORONAVIRUS 2 BY RT PCR: SARS Coronavirus 2 by RT PCR: NEGATIVE

## 2022-01-22 MED ORDER — IPRATROPIUM-ALBUTEROL 0.5-2.5 (3) MG/3ML IN SOLN: 3.0000 mL | Freq: Four times a day (QID) | RESPIRATORY_TRACT | Status: DC | PRN

## 2022-01-22 MED ORDER — KATE FARMS STANDARD 1.4 PO LIQD
237.0000 mL | Freq: Every day | ORAL | Status: DC
  Filled 2022-01-22: qty 325

## 2022-01-22 MED ORDER — FREE WATER
100.0000 mL | Freq: Every day | Status: DC
  Administered 2022-01-22 – 2022-01-24 (×6): 100 mL

## 2022-01-22 MED ORDER — IPRATROPIUM-ALBUTEROL 0.5-2.5 (3) MG/3ML IN SOLN: 3.0000 mL | Freq: Two times a day (BID) | RESPIRATORY_TRACT | Status: DC

## 2022-01-22 NOTE — Progress Notes (Signed)
Initial Nutrition Assessment RD working remotely.  DOCUMENTATION CODES:   Underweight  INTERVENTION:  - recommend diet advancement to Dysphagia 1, thin liquids.  - continue 1 carton (355 ml) [employer] 1.4 x5/day via G-tube.   - will order 50 ml free water before and after each TF bolus (500 ml/day).  - complete NFPE when feasible.   NUTRITION DIAGNOSIS:   Increased nutrient needs related to cancer and cancer related treatments, acute illness as evidenced by estimated needs.  GOAL:   Patient will meet greater than or equal to 90% of their needs  MONITOR:   TF tolerance, PO intake, Labs, Weight trends  REASON FOR ASSESSMENT:   Consult Assessment of nutrition requirement/status  ASSESSMENT:   51 y.o. 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 Patient was seen in the ED on 8/24 due to persistent productive cough. At that time, CT showed a mass vs cavitary PNA in R lung base. He was started on abx. He was seen by Oncology on 9/13 at which time he had not improved. He returned to the ED on 9/30 due to coughing with inability to catch his breath.  Able to talk with patient on the phone. He is being closely followed by a RD at the [facility] and last talked with her on 01/16/22; not from that date reviewed.  Patient denies any abdominal pain, pressure, nausea, or change in bowel habits since 9/25. He denies any changes to home TF regimen, which is currently ordered. He shares that ate home he puts food in a blender to puree it and is able to consume these items without difficulty. Patient is very interested in having more than current diet order (CLD) allows.   He denies any additional nutrition-related questions, concerns, or needs.  Order currently in place for 1 carton (355 ml) [employer] 1.4 x5/day which provides 2275 kcal, 100 grams protein, and 1170 ml free  water.  Weight yesterday was 95 lb and weight on 8/30 was 104 lb. This indicates 9 lb weight loss (8.6% body weight) in the past 1 month; significant for time frame.   Labs reviewed; Na: 131 mmol/l.  Medications reviewed; 400 mg megace BID, 16109 units ergocalciferol every Monday starting 10/2.    NUTRITION - FOCUSED PHYSICAL EXAM:  RD working remotely.  Diet Order:   Diet Order             DIET - DYS 1 Room service appropriate? Yes; Fluid consistency: Thin  Diet effective now                   EDUCATION NEEDS:   No education needs have been identified at this time  Skin:  Skin Assessment: Reviewed RN Assessment  Last BM:  PTA/unknown  Height:   Ht Readings from Last 1 Encounters:  01/21/22 '5\' 4"'$  (1.626 m)    Weight:   Wt Readings from Last 1 Encounters:  01/21/22 43 kg     BMI:  Body mass index is 16.27 kg/m.  Estimated Nutritional Needs:  Kcal:  2100-2400 kcal Protein:  100-120 grams Fluid:  >/= 2 L/day      Jarome Matin, MS, RD, LDN, CNSC Clinical Dietitian PRN/Relief staff On-call/weekend pager # available in [facility]

## 2022-01-22 NOTE — Consult Note (Addendum)
Palliative Care Consult Note                                  Date: 01/22/2022   Patient Name: Trevor [name]  DOB: [birth year]  MRN: [id number]  Age / Sex: 51 y.o., male  PCP: Gilmore Laroche, [name] Referring Physician: Barnetta Chapel, MD  Reason for Consultation: Establishing goals of care  HPI/Patient Profile: 51 y.o. 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  He has a PEG in place.  He is followed by Dr. Ellin Saba for chemotherapy which is currently on hold due to acute infection.  He initially presented to [facility] ED on 01/21/2022 with shortness of breath.  CTA chest shows worsening airspace disease in the right lower lobe, rapid change and associated low-density airway filling suggesting endobronchial spread of infection.  Pre-existing cavitary opacity in the right lower lobe has enlarged with cavitary focus now measuring 2 cm. He was transferred to [facility] for pulmonary and ID consults. Palliative Medicine has been consulted for goals of care.  Past Medical History:  Diagnosis Date   Diabetes mellitus without complication (HCC)    Esophageal cancer (HCC)    Hypertension     Subjective:   I have reviewed medical records including progress notes, labs and imaging, and met with patient at bedside to discuss diagnosis, prognosis, GOC, EOL wishes, disposition, and options.    I introduced Palliative Medicine as specialized medical care for people living with serious illness. It focuses on providing relief from the symptoms and stress of a serious illness.  Patient reports he was recently visited at home by palliative care.  He is unsure of the agency, but I suspect this was through [facility].   Dr. Dartha Lodge is also at bedside. Patient is expressing much frustration regarding current clear liquid diet.  He states that he eats by mouth at home and does not understand why he cannot do that here.   He complains of being very hungry.  We reviewed abnormal CT chest findings with suspected cavitary opacity in the right lower lobe that has enlarged since August.  Discussed concern that right lower lobe cavitation has enlarged despite 2 rounds of outpatient antibiotics.  Discussed that etiology is currently unclear but concerning for infectious versus cancerous process.  We discussed patient's current illness and what it means in the larger context of his underlying esophageal cancer.  We reviewed patient's oncologic history with initial diagnosis in May 2023 and treatment with chemotherapy that is currently on hold due to acute infection.  Trevor [name] verbalizes understanding that the intent of treatment is palliative rather than curative.  He is hopeful to have a "few more years".   Brief life review was discussed.  Trevor [name] is originally from [place].  He relocated to [place] approximately 4 years ago to be closer to his brother.  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  He went on disability a few weeks ago, and expresses concern about being able to pay his medical bills.  Offered to involve social work and/or Corporate investment banker.  Patient is of Muslim faith.  Trevor [name] lives alone at his home in [place].  He is ambulatory and independent with ADLs.  His brother lives nearby and helps with meals.  I reports that his brother would be his decision maker in the event he was unable to  make medical decisions for himself.  I offered assistance with creating an HCPOA document while he is in the hospital, to which she is agreeable.  Values and goals of care important to patient were discussed. The patient is interested in all full scope medical interventions offered. We did discuss code status. Encouraged patient to consider DNR/DNI status understanding prognosis would be poor in the event of cardiopulmonary arrest, as the cause of the arrest would likely be associated with terminal  disease rather than a reversible acute cardio-pulmonary event.  I explained that DNR/DNI is a protective measure to keep [place] from harming the patient in their last moments of life. Patient verbalizes understanding all this, but is clear in his desire to continue full code status.   Questions and concerns addressed.  Patient is agreeable to ongoing palliative support.   Review of Systems  Respiratory:  Positive for shortness of breath.     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  Constitutional:      General: He is not in acute distress.    Comments: Chronically ill-appearing  Pulmonary:     Effort: Pulmonary effort is normal.  Abdominal:     Comments: PEG in place  Neurological:     Mental Status: He is alert and oriented to person, place, and time.     Vital Signs:  BP 109/70 (BP Location: Left Arm)   Pulse 98   Temp 97.9 F (36.6 C) (Oral)   Resp 16   Ht $R'5\' 4"'bl$  (1.626 m)   Wt 43 kg   SpO2 99%   BMI 16.27 kg/m   Palliative Assessment/Data: PPS 50%     Assessment & Plan:   SUMMARY OF RECOMMENDATIONS   Full code Continue full scope care Patient's stated goal is to improve and continue chemotherapy to have more time Spiritual care consult for assistance with creating HCPOA document PMT will continue to follow Recommend outpatient palliative discharge (sounds like patient is already enrolled with this through hospice of [facility])  Primary Decision Maker: PATIENT  Prognosis:  Unable to determine  Discharge Planning:  To Be Determined    Thank you for allowing [place] to participate in the care of Trevor [name]  MDM - High   Signed by: Elie Confer, NP Palliative Medicine Team  Team Phone # [phone]  For individual providers,  please see AMION

## 2022-01-22 NOTE — Consult Note (Signed)
NAME:  Trevor [name], MRN:  [id number], DOB:  [birth year], LOS: 1 ADMISSION DATE:  01/21/2022, CONSULTATION DATE:  01/22/22 REFERRING MD:  Dr. 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    He is followed by Dr. Delton Coombes, recently completed 6 cycles of  FOLFOX + nivolumab on 9/3.  On chart review, developed nonproductive cough without infectious symptoms treated with short steroid course by PCP 11/28/21.  Had ER visit 8/24 to have spiculated area of consolidation in the medial right lung base with cavitation and fluid level, felt to be infectious vs neoplastic, treated with azithromycin.  Symptoms did not improve, with ongoing productive yellow-green cough, wheezing, and fatigue, but remaining afebrile treated with five days of Augmentin and doxycycline by Oncology on 9/13.  Presented back 9/30 with ongoing productive cough, chills, fatigue and significant dyspnea with minimal exertion.  No reported fever, chest pain, or N/V.  No reported hypoxia.  On workup, found to have enlarging RLL cavitation with air/fluid level and adjacent RLL opacity.  Transferred to [facility], admitted by [facility], and pulmonary consulted for further evaluation.  Currently being treated with IV vanc, meropenum, and flagyl.  Palliative care has also been consulted.   Pertinent  Medical History  Tobacco and ETOH abuse, Esophageal Ca stage IV> with mets s/p PEG, failure to thrive, HTN, DM, GERD, microcytic anemia  Significant Hospital Events: Including procedures, antibiotic start and stop dates in addition to other pertinent events   9/30 admitted   9/30 CTA PE>  1. Negative for pulmonary embolism. 2. Worsening airspace disease in the right lower lobe, rapid change and associated  low-density airway filling suggesting endobronchial spread of infection. Pre-existing cavitary opacity in the right lower lobe has enlarged with cavitary focus now measuring 2 cm; affected airways are in close proximity to the cavity. Underlying pulmonary mass is not excluded in the setting of stage IV esophageal cancer.  Interim History / Subjective:   Objective   Blood pressure 105/72, pulse 98, temperature 97.9 F (36.6 C), temperature source Oral, resp. rate 16, height '5\' 4"'$  (1.626 m), weight 43 kg, SpO2 99 %.        Intake/Output Summary (Last 24 hours) at 01/22/2022 1759 Last data filed at 01/22/2022 2355 Gross per 24 hour  Intake 94.16 ml  Output 350 ml  Net -255.84 ml   Filed Weights   01/21/22 0225  Weight: 43 kg   Examination: General:  chronically ill thin appearing male sitting up in bed eating dinner HEENT: MM pink/moist Neuro: alert, oriented, MAE CV: rr, ST PULM:  non-labored, clear  GI: soft, bsx4 active  Extremities: warm/dry, no LE edema  Skin: no rashes   Resolved Hospital Problem list    Assessment & Plan:   RLL cavitation concerning for infectious vs neoplastic process - enlarging despite two rounds of outpt abx - remains afebrile, developing pancytopenia 10/1 P:  - working on getting patient added for bronch with Dr. Lamonte Sakai on 10/2> will send for cultures, fungal, AFB, and cytology - NPO p midnight - screen for SARS - hold lovenox - continue current abx > IV vanc, meropenem, and flagyl.  MRSA PCR neg, can likely stop vanc soon    Remainder per primary team.  Pulmonary  will continue to follow.   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 Dysphagia s/ p PEG Failure to thrive Hyponatremia Anemia  Best Practice (right click and "Reselect all SmartList Selections" daily)  Per [facility]  Labs   CBC: Recent Labs  Lab 01/18/22 0828 01/21/22 0255 01/22/22 0244  WBC 3.5* 2.9* 3.4*  NEUTROABS 1.3* 1.0*  --    HGB 11.0* 11.6* 8.9*  HCT 32.6* 35.6* 26.5*  MCV 74.1* 75.7* 75.3*  PLT 143* 175 143*    Basic Metabolic Panel: Recent Labs  Lab 01/18/22 0828 01/21/22 0255 01/22/22 0244  NA 132* 131* 131*  K 3.6 4.9 3.8  CL 102 99 103  CO2 20* 22 18*  GLUCOSE 90 101* 89  BUN 23* 22* 16  CREATININE 0.86 0.98 0.86  CALCIUM 9.1 9.5 8.9  MG 1.9  --   --   PHOS  --   --  2.7   GFR: Estimated Creatinine Clearance: 60.4 mL/min (by C-G formula based on SCr of 0.86 mg/dL). Recent Labs  Lab 01/18/22 0828 01/21/22 0255 01/22/22 0244  WBC 3.5* 2.9* 3.4*    Liver Function Tests: Recent Labs  Lab 01/18/22 0828 01/22/22 0244  AST 34  --   ALT 14  --   ALKPHOS 60  --   BILITOT 1.0  --   PROT 7.0  --   ALBUMIN 3.1* 2.1*   No results for input(s): "LIPASE", "AMYLASE" in the last 168 hours. No results for input(s): "AMMONIA" in the last 168 hours.  ABG    Component Value Date/Time   TCO2 25 10/10/2021 0849     Coagulation Profile: No results for input(s): "INR", "PROTIME" in the last 168 hours.  Cardiac Enzymes: No results for input(s): "CKTOTAL", "CKMB", "CKMBINDEX", "TROPONINI" in the last 168 hours.  HbA1C: Hgb A1c MFr Bld  Date/Time Value Ref Range Status  08/18/2021 11:36 AM 5.2 4.8 - 5.6 % Final    Comment:             Prediabetes: 5.7 - 6.4          Diabetes: >6.4          Glycemic control for adults with diabetes: <7.0     CBG: No results for input(s): "GLUCAP" in the last 168 hours.  Review of Systems:   Review of Systems  Constitutional:  Positive for chills, malaise/fatigue and weight loss. Negative for fever.  HENT:  Negative for sore throat.   Respiratory:  Positive for cough, sputum production and shortness of breath. Negative for hemoptysis and wheezing.   Cardiovascular:  Negative for chest pain and leg swelling.  Gastrointestinal:  Negative for nausea and vomiting.  Neurological:  Negative for focal weakness.   Past Medical History:  He,  has a  past medical history of Diabetes mellitus without complication ([place]), Esophageal cancer ([place]), and Hypertension.   Surgical History:   Past Surgical History:  Procedure Laterality Date   BIOPSY  08/26/2021   Procedure: BIOPSY;  Surgeon: Eloise Harman, DO;  [facility];  Service: Endoscopy;;   ESOPHAGOGASTRODUODENOSCOPY (EGD) WITH PROPOFOL N/A 08/26/2021   Procedure: ESOPHAGOGASTRODUODENOSCOPY (EGD) WITH PROPOFOL;  Surgeon: Eloise Harman, DO;  Location: [facility];  Service: Endoscopy;  Laterality: N/A;  2:00pm   GASTROSTOMY N/A 10/12/2021   Procedure: OPEN GASTROSTOMY TUBE;  Surgeon: Aviva Signs, MD;  Location: [facility] ORS;  Service: General;  Laterality: N/A;   PORTACATH PLACEMENT Left 10/12/2021  Procedure: INSERTION PORT-A-CATH;  Surgeon: Aviva Signs, MD;  Location: [facility] ORS;  Service: General;  Laterality: Left;     Social History:   reports that he has been smoking cigarettes. He has been smoking an average of .5 packs per day. He has never used smokeless tobacco. He reports that he does not currently use alcohol after a past usage of about 14.0 standard drinks of alcohol per week. He reports that he does not use drugs.   Family History:  His family history includes Cancer in his father and sister. There is no history of Colon cancer or Esophageal cancer.   Allergies No Known Allergies   Home Medications  Prior to Admission medications   Medication Sig Start Date End Date Taking? Authorizing Provider  albuterol (VENTOLIN HFA) 108 (90 Base) MCG/ACT inhaler Inhale 1-2 puffs into the lungs every 6 (six) hours as needed for wheezing or shortness of breath. 01/04/22  Yes Pennington, Rebekah M, PA-C  allopurinol (ZYLOPRIM) 300 MG tablet Take 1 tablet (300 mg total) by mouth daily. 08/04/21  Yes Sanjuana Kava, MD  amLODipine (NORVASC) 5 MG tablet Take 1 tablet (5 mg total) by mouth daily. 11/28/21  Yes [name], [name]  amoxicillin-clavulanate (AUGMENTIN) 600-42.9 MG/5ML  suspension Place 5 mLs (600 mg total) into feeding tube 2 (two) times daily. 01/04/22  Yes Pennington, Rebekah M, PA-C  doxycycline (VIBRAMYCIN) 100 MG capsule Place 1 capsule (100 mg total) into feeding tube 2 (two) times daily. 01/04/22  Yes Harriett Rush, PA-C  fluorouracil CALGB 60737 2,400 mg/m2 in sodium chloride 0.9 % 150 mL Inject 2,400 mg/m2 into the vein over 48 hr. Every 2 weeks   Yes [provider]  FLUOROURACIL IV Inject into the vein every 14 (fourteen) days.   Yes [provider]  KLOR-CON M20 20 MEQ tablet TAKE 2 TABLETS BY MOUTH DAILY 12/19/21  Yes Derek Jack, MD  LEUCOVORIN CALCIUM IV Inject into the vein every 14 (fourteen) days.   Yes [provider]  loperamide (IMODIUM) 2 MG capsule Take 1 capsule (2 mg total) by mouth as needed for diarrhea or loose stools (take as directed according to the instruction sheet you were given). 10/04/21  Yes Derek Jack, MD  magnesium oxide (MAG-OX) 400 (240 Mg) MG tablet TAKE 1 TABLET BY MOUTH TWICE A DAY Patient taking differently: Take 400 mg by mouth 2 (two) times daily. 01/13/22  Yes [name], [name]  megestrol (MEGACE) 400 MG/10ML suspension Take 10 mLs (400 mg total) by mouth 2 (two) times daily. 12/07/21  Yes Derek Jack, MD  Nutritional Supplements (KATE FARMS STANDARD 1.4) LIQD Give one carton (325 ml) [employer] 1.4 via tube 5x/day. Flush tube with 60 ml water before and after each feeding. Drink by mouth or give via tube additional 2 cups (480 ml) water daily. This provides 2275 kcal, 100 grams protein, 1170 ml free water from formula (2250 ml total water with flushes). Meets 100% needs 11/21/21  Yes Derek Jack, MD  OXALIPLATIN IV Inject into the vein every 14 (fourteen) days.   Yes [provider]  prochlorperazine (COMPAZINE) 10 MG tablet Take 1 tablet (10 mg total) by mouth every 6 (six) hours as needed for nausea or vomiting. 10/27/21  Yes Derek Jack, MD  Vitamin D, Ergocalciferol, (DRISDOL) 1.25 MG (50000 UNIT) CAPS capsule Take 1 capsule (50,000 Units total) by mouth every Monday. 11/28/21  Yes [name], [name]  ibuprofen (ADVIL) 600 MG tablet Take 1 tablet (600 mg total)  by mouth every 8 (eight) hours as needed. Patient not taking: Reported on 01/21/2022 12/28/21   Derek Jack, MD  methylPREDNISolone (MEDROL DOSEPAK) 4 MG TBPK tablet Take as the package instructed Patient not taking: Reported on 01/21/2022 11/28/21   [name], [name]  nicotine (NICODERM CQ - DOSED IN MG/24 HOURS) 21 mg/24hr patch Place 1 patch (21 mg total) onto the skin daily. Patient not taking: Reported on 01/21/2022 10/27/21   Derek Jack, MD     Critical care time: n/a    Kennieth Rad, MSN, AG-ACNP-BC [facility] 01/22/2022, 5:59 PM  See Amion for pager If no response to pager, please call PCCM consult pager After 7:00 pm call Elink

## 2022-01-22 NOTE — H&P (View-Only) (Signed)
NAME:  Trevor [name], MRN:  [id number], DOB:  01/01/1971, LOS: 1 ADMISSION DATE:  01/21/2022, CONSULTATION DATE:  01/22/22 REFERRING MD:  Dr. 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    He is followed by Dr. Delton Coombes, recently completed 6 cycles of  FOLFOX + nivolumab on 9/3.  On chart review, developed nonproductive cough without infectious symptoms treated with short steroid course by PCP 11/28/21.  Had ER visit 8/24 to have spiculated area of consolidation in the medial right lung base with cavitation and fluid level, felt to be infectious vs neoplastic, treated with azithromycin.  Symptoms did not improve, with ongoing productive yellow-green cough, wheezing, and fatigue, but remaining afebrile treated with five days of Augmentin and doxycycline by Oncology on 9/13.  Presented back 9/30 with ongoing productive cough, chills, fatigue and significant dyspnea with minimal exertion.  No reported fever, chest pain, or N/V.  No reported hypoxia.  On workup, found to have enlarging RLL cavitation with air/fluid level and adjacent RLL opacity.  Transferred to [facility], admitted by [facility], and pulmonary consulted for further evaluation.  Currently being treated with IV vanc, meropenum, and flagyl.  Palliative care has also been consulted.   Pertinent  Medical History  Tobacco and ETOH abuse, Esophageal Ca stage IV> with mets s/p PEG, failure to thrive, HTN, DM, GERD, microcytic anemia  Significant Hospital Events: Including procedures, antibiotic start and stop dates in addition to other pertinent events   9/30 admitted   9/30 CTA PE>  1. Negative for pulmonary embolism. 2. Worsening airspace disease in the right lower lobe, rapid change and associated  low-density airway filling suggesting endobronchial spread of infection. Pre-existing cavitary opacity in the right lower lobe has enlarged with cavitary focus now measuring 2 cm; affected airways are in close proximity to the cavity. Underlying pulmonary mass is not excluded in the setting of stage IV esophageal cancer.  Interim History / Subjective:   Objective   Blood pressure 105/72, pulse 98, temperature 97.9 F (36.6 C), temperature source Oral, resp. rate 16, height '5\' 4"'$  (1.626 m), weight 43 kg, SpO2 99 %.        Intake/Output Summary (Last 24 hours) at 01/22/2022 1759 Last data filed at 01/22/2022 0814 Gross per 24 hour  Intake 94.16 ml  Output 350 ml  Net -255.84 ml   Filed Weights   01/21/22 0225  Weight: 43 kg   Examination: General:  chronically ill thin appearing male sitting up in bed eating dinner HEENT: MM pink/moist Neuro: alert, oriented, MAE CV: rr, ST PULM:  non-labored, clear  GI: soft, bsx4 active  Extremities: warm/dry, no LE edema  Skin: no rashes   Resolved Hospital Problem list    Assessment & Plan:   RLL cavitation concerning for infectious vs neoplastic process - enlarging despite two rounds of outpt abx - remains afebrile, developing pancytopenia 10/1 P:  - working on getting patient added for bronch with Dr. Lamonte Sakai on 10/2> will send for cultures, fungal, AFB, and cytology - NPO p midnight - screen for SARS - hold lovenox - continue current abx > IV vanc, meropenem, and flagyl.  MRSA PCR neg, can likely stop vanc soon    Remainder per primary team.  Pulmonary  will continue to follow.   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 Dysphagia s/ p PEG Failure to thrive Hyponatremia Anemia  Best Practice (right click and "Reselect all SmartList Selections" daily)  Per [facility]  Labs   CBC: Recent Labs  Lab 01/18/22 0828 01/21/22 0255 01/22/22 0244  WBC 3.5* 2.9* 3.4*  NEUTROABS 1.3* 1.0*  --    HGB 11.0* 11.6* 8.9*  HCT 32.6* 35.6* 26.5*  MCV 74.1* 75.7* 75.3*  PLT 143* 175 143*    Basic Metabolic Panel: Recent Labs  Lab 01/18/22 0828 01/21/22 0255 01/22/22 0244  NA 132* 131* 131*  K 3.6 4.9 3.8  CL 102 99 103  CO2 20* 22 18*  GLUCOSE 90 101* 89  BUN 23* 22* 16  CREATININE 0.86 0.98 0.86  CALCIUM 9.1 9.5 8.9  MG 1.9  --   --   PHOS  --   --  2.7   GFR: Estimated Creatinine Clearance: 60.4 mL/min (by C-G formula based on SCr of 0.86 mg/dL). Recent Labs  Lab 01/18/22 0828 01/21/22 0255 01/22/22 0244  WBC 3.5* 2.9* 3.4*    Liver Function Tests: Recent Labs  Lab 01/18/22 0828 01/22/22 0244  AST 34  --   ALT 14  --   ALKPHOS 60  --   BILITOT 1.0  --   PROT 7.0  --   ALBUMIN 3.1* 2.1*   No results for input(s): "LIPASE", "AMYLASE" in the last 168 hours. No results for input(s): "AMMONIA" in the last 168 hours.  ABG    Component Value Date/Time   TCO2 25 10/10/2021 0849     Coagulation Profile: No results for input(s): "INR", "PROTIME" in the last 168 hours.  Cardiac Enzymes: No results for input(s): "CKTOTAL", "CKMB", "CKMBINDEX", "TROPONINI" in the last 168 hours.  HbA1C: Hgb A1c MFr Bld  Date/Time Value Ref Range Status  08/18/2021 11:36 AM 5.2 4.8 - 5.6 % Final    Comment:             Prediabetes: 5.7 - 6.4          Diabetes: >6.4          Glycemic control for adults with diabetes: <7.0     CBG: No results for input(s): "GLUCAP" in the last 168 hours.  Review of Systems:   Review of Systems  Constitutional:  Positive for chills, malaise/fatigue and weight loss. Negative for fever.  HENT:  Negative for sore throat.   Respiratory:  Positive for cough, sputum production and shortness of breath. Negative for hemoptysis and wheezing.   Cardiovascular:  Negative for chest pain and leg swelling.  Gastrointestinal:  Negative for nausea and vomiting.  Neurological:  Negative for focal weakness.   Past Medical History:  He,  has a  past medical history of Diabetes mellitus without complication ([place]), Esophageal cancer ([place]), and Hypertension.   Surgical History:   Past Surgical History:  Procedure Laterality Date   BIOPSY  08/26/2021   Procedure: BIOPSY;  Surgeon: Eloise Harman, DO;  [facility];  Service: Endoscopy;;   ESOPHAGOGASTRODUODENOSCOPY (EGD) WITH PROPOFOL N/A 08/26/2021   Procedure: ESOPHAGOGASTRODUODENOSCOPY (EGD) WITH PROPOFOL;  Surgeon: Eloise Harman, DO;  [facility]: [facility];  Service: Endoscopy;  Laterality: N/A;  2:00pm   GASTROSTOMY N/A 10/12/2021   Procedure: OPEN GASTROSTOMY TUBE;  Surgeon: Aviva Signs, MD;  Location: [facility] ORS;  Service: General;  Laterality: N/A;   PORTACATH PLACEMENT Left 10/12/2021  Procedure: INSERTION PORT-A-CATH;  Surgeon: Aviva Signs, MD;  Location: [facility] ORS;  Service: General;  Laterality: Left;     Social History:   reports that he has been smoking cigarettes. He has been smoking an average of .5 packs per day. He has never used smokeless tobacco. He reports that he does not currently use alcohol after a past usage of about 14.0 standard drinks of alcohol per week. He reports that he does not use drugs.   Family History:  His family history includes Cancer in his father and sister. There is no history of Colon cancer or Esophageal cancer.   Allergies No Known Allergies   Home Medications  Prior to Admission medications   Medication Sig Start Date End Date Taking? Authorizing Provider  albuterol (VENTOLIN HFA) 108 (90 Base) MCG/ACT inhaler Inhale 1-2 puffs into the lungs every 6 (six) hours as needed for wheezing or shortness of breath. 01/04/22  Yes Pennington, Rebekah M, PA-C  allopurinol (ZYLOPRIM) 300 MG tablet Take 1 tablet (300 mg total) by mouth daily. 08/04/21  Yes Sanjuana Kava, MD  amLODipine (NORVASC) 5 MG tablet Take 1 tablet (5 mg total) by mouth daily. 11/28/21  Yes [name], [name]  amoxicillin-clavulanate (AUGMENTIN) 600-42.9 MG/5ML  suspension Place 5 mLs (600 mg total) into feeding tube 2 (two) times daily. 01/04/22  Yes Pennington, Rebekah M, PA-C  doxycycline (VIBRAMYCIN) 100 MG capsule Place 1 capsule (100 mg total) into feeding tube 2 (two) times daily. 01/04/22  Yes Harriett Rush, PA-C  fluorouracil CALGB 58527 2,400 mg/m2 in sodium chloride 0.9 % 150 mL Inject 2,400 mg/m2 into the vein over 48 hr. Every 2 weeks   Yes [provider]  FLUOROURACIL IV Inject into the vein every 14 (fourteen) days.   Yes [provider]  KLOR-CON M20 20 MEQ tablet TAKE 2 TABLETS BY MOUTH DAILY 12/19/21  Yes Derek Jack, MD  LEUCOVORIN CALCIUM IV Inject into the vein every 14 (fourteen) days.   Yes [provider]  loperamide (IMODIUM) 2 MG capsule Take 1 capsule (2 mg total) by mouth as needed for diarrhea or loose stools (take as directed according to the instruction sheet you were given). 10/04/21  Yes Derek Jack, MD  magnesium oxide (MAG-OX) 400 (240 Mg) MG tablet TAKE 1 TABLET BY MOUTH TWICE A DAY Patient taking differently: Take 400 mg by mouth 2 (two) times daily. 01/13/22  Yes [name], [name]  megestrol (MEGACE) 400 MG/10ML suspension Take 10 mLs (400 mg total) by mouth 2 (two) times daily. 12/07/21  Yes Derek Jack, MD  Nutritional Supplements (KATE FARMS STANDARD 1.4) LIQD Give one carton (325 ml) [employer] 1.4 via tube 5x/day. Flush tube with 60 ml water before and after each feeding. Drink by mouth or give via tube additional 2 cups (480 ml) water daily. This provides 2275 kcal, 100 grams protein, 1170 ml free water from formula (2250 ml total water with flushes). Meets 100% needs 11/21/21  Yes Derek Jack, MD  OXALIPLATIN IV Inject into the vein every 14 (fourteen) days.   Yes [provider]  prochlorperazine (COMPAZINE) 10 MG tablet Take 1 tablet (10 mg total) by mouth every 6 (six) hours as needed for nausea or vomiting. 10/27/21  Yes Derek Jack, MD  Vitamin D, Ergocalciferol, (DRISDOL) 1.25 MG (50000 UNIT) CAPS capsule Take 1 capsule (50,000 Units total) by mouth every Monday. 11/28/21  Yes [name], [name]  ibuprofen (ADVIL) 600 MG tablet Take 1 tablet (600 mg total)  by mouth every 8 (eight) hours as needed. Patient not taking: Reported on 01/21/2022 12/28/21   Derek Jack, MD  methylPREDNISolone (MEDROL DOSEPAK) 4 MG TBPK tablet Take as the package instructed Patient not taking: Reported on 01/21/2022 11/28/21   [name], [name]  nicotine (NICODERM CQ - DOSED IN MG/24 HOURS) 21 mg/24hr patch Place 1 patch (21 mg total) onto the skin daily. Patient not taking: Reported on 01/21/2022 10/27/21   Derek Jack, MD     Critical care time: n/a    Kennieth Rad, MSN, AG-ACNP-BC [facility] 01/22/2022, 5:59 PM  See Amion for pager If no response to pager, please call PCCM consult pager After 7:00 pm call Elink

## 2022-01-22 NOTE — Progress Notes (Addendum)
PROGRESS NOTE    Trevor [name]  LDJ:[id number] DOB: 05-Feb-1971 DOA: 01/21/2022 PCP: [name], [name]  Outpatient Specialists:     Brief Narrative:  As per H&P done on admission: "Trevor [name]  is a 51 y.o. male with past medical history relevant for tobacco abuse, alcohol abuse (quit in the summer 2023), HTN, stage IVb esophageal cancer with mets, status post PEG tube placement, failure to thrive/malnutrition who was diagnosed with cavitary pneumonia 12/15/2021 and treated with Augmentin and doxycycline.... By his oncologist Dr. Delton Coombes -Respiratory symptoms have persisted, reports productive cough, chills , fatigue and malaise as well as  significant dyspnea with minimal activity despite bronchodilator use -No frank chest pains no pleuritic symptoms -No vomiting or diarrhea -No significant hypoxia -In the ED repeat CTA chest on 01/21/2022 shows-Worsening airspace disease in the right lower lobe, rapid change and associated low-density airway filling suggesting endobronchial spread of infection. Pre-existing cavitary opacity in the right lower lobe has enlarged with cavitary focus now measuring 2 cm; affected airways are in close proximity to the cavity. Underlying pulmonary mass is not excluded in the setting of stage IV esophageal cancer. - -EDP gave Levaquin -Sodium is 131, creatinine 0.98 -WBC is down to 2.9 from 6.2 a month ago -Hemoglobin is 11.6 which is around patient's prior baseline, low MCV and MCH noted, platelets 175"  01/22/2022: Patient seen.  No new complaints.  Patient is currently on IV vancomycin, meropenem and Flagyl for cavitary pneumonia/possible abscess.  Awaiting pulmonary input.  Palliative care input is also appreciated.   Assessment & Plan:   Principal Problem:   Cavitary pneumonia/?? 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 Lung Abscess--failed  outpatient doxycycline and Augmentin -EDP gave Levaquin on 01/21/2022 --WBC is down to 2.9 from 6.2 a month ago --No significant hypoxia --despite treatment with 2 weeks of doxycycline and 2 weeks of Augmentin clinically and radiologically his cavitary pneumonia has worsened -Patient has worsening leukopenia we will treat empirically with vancomycin, Flagyl and meropenem to cover for Staph and anaerobes -Bronchodilators and mucolytics as ordered -Blood cultures pending -Sputum culture requested --Get pulmonology consult -Consider infectious disease consult In the ED-- repeat CTA chest on 01/21/2022 shows-Worsening airspace disease in the right lower lobe, rapid change and associated low-density airway filling suggesting endobronchial spread of infection. Pre-existing cavitary opacity in the right lower lobe has enlarged with cavitary focus now measuring 2 cm; affected airways are in close proximity to the cavity. Underlying pulmonary mass is not excluded in the setting of stage IV esophageal cancer. 01/22/2022: Await pulmonary input.  Continue antibiotics for now.   2)Dysphagia/FTT--status post PEG tube placement in the setting of esophageal cancer with dysphagia and odynophagia -Continue tube feeding--Give one carton (325 ml) [employer] 1.4 via tube 5x/day. Flush tube with 60 ml water before and after each feeding. Drink by mouth or give via tube additional 2 cups (480 ml) water daily. This provides 2275 kcal, 100 grams protein, 1170 ml free water from formula (2250 ml total water with flushes).    -Get dietitian consult -Okay to take oral liquids as tolerated -Megace for appetite stimulation 01/22/2022: Patient insists on soft diet.     3)Hyponatremia----?? SIADH related pneumonia and possible lung cancer, in the setting of malnutrition/poor oral intake -May consider decreasing free water flushes through PEG tube -Patient may drink other liquids liberally as tolerated 01/22/2022: Stable on  chronic.  4)Anemia--- Hgb 11.6 which is at or above recent baseline -Suspect partly due to underlying malignancy with chemotherapy, suspect nutritional component -B12, folate, serum iron TIBC and ferritin pending -No bleeding concerns   5)Stage IV B Squamous cell esophageal cancer --repeat CTA chest on 01/21/2022 suggest possible pulmonary mass ([place]) -Patient follows with Dr. Delton Coombes for chemo which is currently on hold given acute infection - 6)Tobacco abuse--nicotine patch as ordered   7)Social/ethics--- patient is a full code -Palliative consult requested given comorbidities   DVT prophylaxis: SCD. Code Status: Full code Family Communication:  Disposition Plan: This will depend on hospital course   Consultants:  Awaiting pulmonary input Palliative care consult  Procedures:  None  Antimicrobials:  IV meropenem IV Flagyl IV vancomycin   Subjective: No new complaints today. No fever or chills.  Objective: Vitals:   01/22/22 0810 01/22/22 0839 01/22/22 1242 01/22/22 1600  BP: 109/70  105/72   Pulse: 98     Resp: 16     Temp: 97.9 F (36.6 C)  97.7 F (36.5 C) 97.9 F (36.6 C)  TempSrc: Oral  Oral Oral  SpO2: 100% 99%    Weight:      Height:        Intake/Output Summary (Last 24 hours) at 01/22/2022 1740 Last data filed at 01/22/2022 9379 Gross per 24 hour  Intake 94.16 ml  Output 350 ml  Net -255.84 ml   Filed Weights   01/21/22 0225  Weight: 43 kg    Examination:  General exam: Patient is not in any distress.  Patient is cachectic.   Respiratory system: Clear to auscultation.  Cardiovascular system: S1 & S2 heard Central nervous system: Alert and oriented. No focal neurological deficits. Extremities: No leg edema.  Data Reviewed: I have personally reviewed following labs and imaging studies  CBC: Recent Labs  Lab 01/18/22 0828 01/21/22 0255 01/22/22 0244  WBC 3.5* 2.9* 3.4*  NEUTROABS 1.3* 1.0*  --   HGB 11.0* 11.6* 8.9*  HCT 32.6*  35.6* 26.5*  MCV 74.1* 75.7* 75.3*  PLT 143* 175 024*   Basic Metabolic Panel: Recent Labs  Lab 01/18/22 0828 01/21/22 0255 01/22/22 0244  NA 132* 131* 131*  K 3.6 4.9 3.8  CL 102 99 103  CO2 20* 22 18*  GLUCOSE 90 101* 89  BUN 23* 22* 16  CREATININE 0.86 0.98 0.86  CALCIUM 9.1 9.5 8.9  MG 1.9  --   --   PHOS  --   --  2.7   GFR: Estimated Creatinine Clearance: 60.4 mL/min (by C-G formula based on SCr of 0.86 mg/dL). Liver Function Tests: Recent Labs  Lab 01/18/22 0828 01/22/22 0244  AST 34  --   ALT 14  --   ALKPHOS 60  --   BILITOT 1.0  --   PROT 7.0  --   ALBUMIN 3.1* 2.1*   No results for input(s): "LIPASE", "AMYLASE" in the last 168 hours. No results for input(s): "AMMONIA" in the last 168 hours. Coagulation Profile: No results for input(s): "INR", "PROTIME" in the last 168 hours. Cardiac Enzymes: No results for input(s): "CKTOTAL", "CKMB", "CKMBINDEX", "TROPONINI" in the last 168 hours. BNP (last 3 results) No results for input(s): "PROBNP" in the last 8760 hours. HbA1C: No results for input(s): "HGBA1C" in the last 72 hours. CBG: No results for input(s): "GLUCAP" in the last 168 hours. Lipid Profile: No results for input(s): "CHOL", "HDL", "LDLCALC", "TRIG", "CHOLHDL", "LDLDIRECT" in the last 72 hours. Thyroid Function Tests: No results  for input(s): "TSH", "T4TOTAL", "FREET4", "T3FREE", "THYROIDAB" in the last 72 hours. Anemia Panel: Recent Labs    01/21/22 1317 01/22/22 0244  VITAMINB12  --  1,295*  FOLATE  --  >40.0  FERRITIN 980*  --   TIBC 215*  --   IRON 30*  --    Urine analysis: No results found for: "COLORURINE", "APPEARANCEUR", "LABSPEC", "PHURINE", "GLUCOSEU", "HGBUR", "BILIRUBINUR", "KETONESUR", "PROTEINUR", "UROBILINOGEN", "NITRITE", "LEUKOCYTESUR" Sepsis Labs: '@LABRCNTIP'$ (procalcitonin:4,lacticidven:4)  ) Recent Results (from the past 240 hour(s))  MRSA Next Gen by PCR, Nasal     Status: None   Collection Time: 01/21/22 12:32  PM   Specimen: Nasal Mucosa; Nasal Swab  Result Value Ref Range Status   MRSA by PCR Next Gen NOT DETECTED NOT DETECTED Final    Comment: (NOTE) The GeneXpert MRSA Assay (FDA approved for NASAL specimens only), is one component of a comprehensive MRSA colonization surveillance program. It is not intended to diagnose MRSA infection nor to guide or monitor treatment for MRSA infections. Test performance is not FDA approved in patients less than 58 years old. Performed at [facility], [address] [place], [place] [address]   Culture, blood (Routine X 2) w Reflex to ID Panel     Status: None (Preliminary result)   Collection Time: 01/21/22  1:17 PM   Specimen: BLOOD  Result Value Ref Range Status   Specimen Description BLOOD  Final   Special Requests BLOOD  Final   Culture   Final    NO GROWTH < 12 HOURS Performed at [facility], [address], [place], [place] [address]    Report Status PENDING  Incomplete  Culture, blood (Routine X 2) w Reflex to ID Panel     Status: None (Preliminary result)   Collection Time: 01/21/22  1:17 PM   Specimen: BLOOD LEFT FOREARM  Result Value Ref Range Status   Specimen Description   Final    BLOOD LEFT FOREARM BOTTLES DRAWN AEROBIC AND ANAEROBIC   Special Requests Blood Culture adequate volume  Final   Culture   Final    NO GROWTH < 12 HOURS Performed at [facility], [address] [place], [place] [address]    Report Status PENDING  Incomplete         Radiology Studies: CT Angio Chest PE W/Cm &/Or Wo Cm  Result Date: 01/21/2022 CLINICAL DATA:  Difficulty breathing.  Esophageal cancer. EXAM: CT ANGIOGRAPHY CHEST WITH CONTRAST TECHNIQUE: Multidetector CT imaging of the chest was performed using the standard protocol during bolus administration of intravenous contrast. Multiplanar CT image reconstructions and MIPs were obtained to evaluate the vascular anatomy. RADIATION DOSE REDUCTION: This exam was performed according to the departmental  dose-optimization program which includes automated exposure control, adjustment of the mA and/or kV according to patient size and/or use of iterative reconstruction technique. CONTRAST:  115m OMNIPAQUE IOHEXOL 350 MG/ML SOLN COMPARISON:  12/15/2021 FINDINGS: Cardiovascular: Satisfactory opacification of the pulmonary arteries to the segmental level. No evidence of pulmonary embolism. Normal heart size. No pericardial effusion. Porta catheter from the left in unremarkable position. Mediastinum/Nodes: Increased size of a right paratracheal node, 8 mm in short axis, possibly reactive in this setting. Similar size subcarinal node. Unchanged appearance of the esophagus. Lungs/Pleura: Patchy opacity in the right lower lobe, 1 showing tiny focus of cavitation. Pre-existing cavitary focus with fluid level in the medial right lower lobe measures 20 mm at the fluid collection enlarged. Adjacent right lower lobe airways show low-density opacification. No pleural effusion or  pneumothorax. Upper Abdomen: No acute finding Musculoskeletal: Numerous remote right rib fractures with hypertrophic healing. No acute or aggressive finding. Review of the MIP images confirms the above findings. IMPRESSION: 1. Negative for pulmonary embolism. 2. Worsening airspace disease in the right lower lobe, rapid change and associated low-density airway filling suggesting endobronchial spread of infection. Pre-existing cavitary opacity in the right lower lobe has enlarged with cavitary focus now measuring 2 cm; affected airways are in close proximity to the cavity. Underlying pulmonary mass is not excluded in the setting of stage IV esophageal cancer. Electronically Signed   By: Jorje Guild M.D.   On: 01/21/2022 05:04   DG Chest Portable 1 View  Result Date: 01/21/2022 CLINICAL DATA:  Shortness of breath EXAM: PORTABLE CHEST 1 VIEW COMPARISON:  01/04/2022 FINDINGS: Left-sided chest port is again identified and stable. Lungs are well aerated  bilaterally. Healed rib fractures are noted bilaterally. Cardiac shadow is within normal limits. IMPRESSION: No acute abnormality noted. Electronically Signed   By: Inez Catalina M.D.   On: 01/21/2022 02:52        Scheduled Meds:  allopurinol  300 mg Oral Daily   dextromethorphan-guaiFENesin  1 tablet Oral BID   enoxaparin (LOVENOX) injection  30 mg Subcutaneous Q24H   feeding supplement ([employer] STANDARD 1.4)  237 mL Per Tube 5 X Daily   free water  100 mL Per Tube 5 X Daily   megestrol  400 mg Oral BID   nicotine  21 mg Transdermal Daily   sodium chloride flush  3 mL Intravenous Q12H   sodium chloride flush  3 mL Intravenous Q12H   [START ON 01/23/2022] Vitamin D (Ergocalciferol)  50,000 Units Oral Q Mon   Continuous Infusions:  sodium chloride     meropenem (MERREM) IV 1 g (01/22/22 1459)   metronidazole 500 mg (01/22/22 1300)   vancomycin 750 mg (01/22/22 1542)     LOS: 1 day    Time spent: 55 minutes.    Dana Allan, MD  [facility] Pager #: [phone] 7PM-7AM contact night coverage as above

## 2022-01-23 ENCOUNTER — Encounter (HOSPITAL_COMMUNITY): Payer: Self-pay | Admitting: Family Medicine

## 2022-01-23 ENCOUNTER — Inpatient Hospital Stay (HOSPITAL_COMMUNITY): Payer: 59 | Admitting: Anesthesiology

## 2022-01-23 ENCOUNTER — Inpatient Hospital Stay (HOSPITAL_COMMUNITY): Payer: 59

## 2022-01-23 ENCOUNTER — Other Ambulatory Visit: Payer: Self-pay

## 2022-01-23 ENCOUNTER — Encounter (HOSPITAL_COMMUNITY)
Admission: EM | Disposition: A | Discharge status: Home Health | Payer: Self-pay | Source: Home / Self Care | Attending: Family Medicine

## 2022-01-23 DIAGNOSIS — E119 Type 2 diabetes mellitus without complications: Secondary | ICD-10-CM

## 2022-01-23 DIAGNOSIS — R918 Other nonspecific abnormal finding of lung field: Secondary | ICD-10-CM | POA: Diagnosis not present

## 2022-01-23 DIAGNOSIS — D649 Anemia, unspecified: Secondary | ICD-10-CM

## 2022-01-23 DIAGNOSIS — E871 Hypo-osmolality and hyponatremia: Secondary | ICD-10-CM

## 2022-01-23 DIAGNOSIS — J984 Other disorders of lung: Secondary | ICD-10-CM | POA: Diagnosis not present

## 2022-01-23 DIAGNOSIS — J189 Pneumonia, unspecified organism: Secondary | ICD-10-CM | POA: Diagnosis not present

## 2022-01-23 DIAGNOSIS — R911 Solitary pulmonary nodule: Secondary | ICD-10-CM

## 2022-01-23 DIAGNOSIS — I10 Essential (primary) hypertension: Secondary | ICD-10-CM

## 2022-01-23 HISTORY — PX: BRONCHIAL BIOPSY: SHX5109

## 2022-01-23 HISTORY — PX: VIDEO BRONCHOSCOPY WITH RADIAL ENDOBRONCHIAL ULTRASOUND: SHX6849

## 2022-01-23 HISTORY — PX: BRONCHIAL BRUSHINGS: SHX5108

## 2022-01-23 HISTORY — PX: BRONCHIAL WASHINGS: SHX5105

## 2022-01-23 HISTORY — PX: BRONCHIAL NEEDLE ASPIRATION BIOPSY: SHX5106

## 2022-01-23 LAB — GLUCOSE, CAPILLARY
Glucose-Capillary: 77 mg/dL (ref 70–99)
Glucose-Capillary: 98 mg/dL (ref 70–99)

## 2022-01-23 SURGERY — BRONCHOSCOPY, WITH BIOPSY USING ELECTROMAGNETIC NAVIGATION
Anesthesia: General | Laterality: Right

## 2022-01-23 MED ORDER — MIDAZOLAM HCL 2 MG/2ML IJ SOLN
INTRAMUSCULAR | Status: DC | PRN
  Administered 2022-01-23: 2 mg via INTRAVENOUS

## 2022-01-23 MED ORDER — FENTANYL CITRATE (PF) 250 MCG/5ML IJ SOLN
INTRAMUSCULAR | Status: DC | PRN
  Administered 2022-01-23 (×2): 50 ug via INTRAVENOUS

## 2022-01-23 MED ORDER — SUGAMMADEX SODIUM 200 MG/2ML IV SOLN
INTRAVENOUS | Status: DC | PRN
  Administered 2022-01-23: 200 mg via INTRAVENOUS

## 2022-01-23 MED ORDER — PROPOFOL 10 MG/ML IV BOLUS
INTRAVENOUS | Status: DC | PRN
  Administered 2022-01-23: 60 mg via INTRAVENOUS

## 2022-01-23 MED ORDER — LACTATED RINGERS IV SOLN: INTRAVENOUS | Status: DC

## 2022-01-23 MED ORDER — CHLORHEXIDINE GLUCONATE 0.12 % MT SOLN
OROMUCOSAL | Status: AC
  Administered 2022-01-23: 15 mL via OROMUCOSAL
  Filled 2022-01-23: qty 15

## 2022-01-23 MED ORDER — LIDOCAINE 2% (20 MG/ML) 5 ML SYRINGE
INTRAMUSCULAR | Status: DC | PRN
  Administered 2022-01-23: 20 mg via INTRAVENOUS

## 2022-01-23 MED ORDER — DEXAMETHASONE SODIUM PHOSPHATE 10 MG/ML IJ SOLN
INTRAMUSCULAR | Status: DC | PRN
  Administered 2022-01-23: 10 mg via INTRAVENOUS

## 2022-01-23 MED ORDER — PHENYLEPHRINE 80 MCG/ML (10ML) SYRINGE FOR IV PUSH (FOR BLOOD PRESSURE SUPPORT)
PREFILLED_SYRINGE | INTRAVENOUS | Status: DC | PRN
  Administered 2022-01-23 (×3): 160 ug via INTRAVENOUS
  Administered 2022-01-23: 80 ug via INTRAVENOUS
  Administered 2022-01-23 (×3): 160 ug via INTRAVENOUS

## 2022-01-23 MED ORDER — ROCURONIUM BROMIDE 10 MG/ML (PF) SYRINGE
PREFILLED_SYRINGE | INTRAVENOUS | Status: DC | PRN
  Administered 2022-01-23: 50 mg via INTRAVENOUS

## 2022-01-23 MED ORDER — ONDANSETRON HCL 4 MG/2ML IJ SOLN
INTRAMUSCULAR | Status: DC | PRN
  Administered 2022-01-23: 4 mg via INTRAVENOUS

## 2022-01-23 MED ORDER — INSULIN ASPART 100 UNIT/ML IJ SOLN: 0.0000 [IU] | INTRAMUSCULAR | Status: DC | PRN

## 2022-01-23 MED ORDER — CHLORHEXIDINE GLUCONATE 0.12 % MT SOLN: 15.0000 mL | Freq: Once | OROMUCOSAL | Status: AC

## 2022-01-23 NOTE — Progress Notes (Signed)
PT Cancellation Note  Patient Details Name: Trevor [name] MRN: [id number] DOB: Feb 26, 1971   Cancelled Treatment:    Reason Eval/Treat Not Completed: Patient declined, no reason specified.  That anesthesia's got me too tired today. 01/23/2022  Ginger Carne., PT Acute Rehabilitation Services [phone]  (office)   Tessie Fass Maneh Sieben 01/23/2022, 4:41 PM

## 2022-01-23 NOTE — Op Note (Signed)
Video Bronchoscopy with Robotic Assisted Bronchoscopic Navigation   Date of Operation: 01/23/2022   Pre-op Diagnosis: Right lower lobe pulmonary nodules  Post-op Diagnosis: Same  Surgeon: Baltazar Apo  Assistants: None  Anesthesia: General endotracheal anesthesia  Operation: Flexible video fiberoptic bronchoscopy with robotic assistance and biopsies.  Estimated Blood Loss: Minimal  Complications: None  Indications and History: Trevor [name] is a 51 y.o. male with history of squamous cell esophageal cancer on therapy.  He was found to have right lower lobe pulmonary nodules, evolving cavitation.  He was treated with antibiotics but nodular disease progressed.  Recommendation made to obtain culture data and achieve a tissue diagnosis via robotic assisted navigational bronchoscopy. The risks, benefits, complications, treatment options and expected outcomes were discussed with the patient.  The possibilities of pneumothorax, pneumonia, reaction to medication, pulmonary aspiration, perforation of a viscus, bleeding, failure to diagnose a condition and creating a complication requiring transfusion or operation were discussed with the patient who freely signed the consent.    Description of Procedure: The patient was seen in the Preoperative Area, was examined and was deemed appropriate to proceed.  The patient was taken to [facility] endoscopy room 3, identified as Trevor [name] and the procedure verified as Flexible Video Fiberoptic Bronchoscopy.  A Time Out was held and the above information confirmed.   Prior to the date of the procedure a high-resolution CT scan of the chest was performed. Utilizing ION software program a virtual tracheobronchial tree was generated to allow the creation of distinct navigation pathways to the patient's parenchymal abnormalities. After being taken to the operating room general anesthesia was initiated and the patient  was orally intubated. The video fiberoptic bronchoscope was  introduced via the endotracheal tube and a general inspection was performed which showed normal right and left lung anatomy. Aspiration of the bilateral mainstems was completed to remove any remaining secretions. Robotic catheter inserted into patient's endotracheal tube.   Target #1 right lower lobe medial basilar cavitary nodule: The distinct navigation pathways prepared prior to this procedure were then utilized to navigate to patient's lesion identified on CT scan. The robotic catheter was secured into place and the vision probe was withdrawn.  Lesion location was approximated using fluoroscopy and radial endobronchial ultrasound for peripheral targeting. Under fluoroscopic guidance transbronchial needle brushings, transbronchial needle biopsies, and transbronchial forceps biopsies were performed to be sent for cytology and pathology.  A single transbronchial needle aspiration was sent for culture data.  A bronchioalveolar lavage was performed in the right lower lobe adjacent to the nodule and sent for microbiology and cytology.  Target #2 right lower lobe more solid pulmonary nodule: The distinct navigation pathways prepared prior to this procedure were then utilized to navigate to patient's lesion identified on CT scan. The robotic catheter was secured into place and the vision probe was withdrawn.  Lesion location was approximated using fluoroscopy and radial endobronchial ultrasound for peripheral targeting.  Local registration and targeting was performed using Cios three-dimensional imaging.  Under fluoroscopic guidance transbronchial needle brushings, transbronchial needle biopsies, and transbronchial forceps biopsies were performed to be sent for cytology and pathology.  Needle in lesion was confirmed with Cios imaging.  A bronchioalveolar lavage was performed in the right lower lobe adjacent to the nodule and sent for microbiology and cytology.  At the end of the procedure a general airway  inspection was performed and there was no evidence of active bleeding. The bronchoscope was removed.  The patient tolerated the procedure well. There was no  significant blood loss and there were no obvious complications. A post-procedural chest x-ray is pending.  Samples Target #1: 1. Transbronchial needle brushings from right lower lobe medial basilar cavitary nodule 2. Transbronchial Wang needle biopsies from right lower lobe medial basilar cavitary nodule 3. Transbronchial forceps biopsies from right lower lobe medial basilar cavitary nodule 4. Bronchoalveolar lavage from right lower lobe adjacent to cavitary nodule  Samples Target #2: 1. Transbronchial needle brushings from right lower lobe more solid nodule 2. Transbronchial Wang needle biopsies from right lower lobe more solid nodule 3. Transbronchial forceps biopsies from right lower lobe more solid nodule 4. Bronchoalveolar lavage from right lower lobe adjacent to solid nodule   Plans:  The patient will be transferred back to his floor bed from the PACU when recovered from anesthesia.  We will follow cytology, pathology and microbiology results with you.   Baltazar Apo, MD, PhD 01/23/2022, 10:42 AM [facility] [phone] or if no answer before 7:00PM call [phone] For any issues after 7:00PM please call eLink [phone]

## 2022-01-23 NOTE — Hospital Course (Addendum)
Trevor [name] is a 51 y.o. 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      8/7: Had new cough, treated with steroid taper by PCP 8/24: ER visit for persistent fatigue, dyspnea, CT chest shows R base infiltrate with cavitation, treated with azithroymcin 9/3: Completed FOLFOX/nivolumab  9/13: Oncology visit for ongoing productive cough, fatigue, dyspnea, treated with Augmentin/doxy for 5 days      9/30: Symptoms worsening, so admitted and transferred from [facility] to [facility] for bronchoscopy 10/1: Pulm consulted 10/2: Underwent bronchoscopy with frozen path negative for malignancy

## 2022-01-23 NOTE — Assessment & Plan Note (Addendum)
Hgb stable 

## 2022-01-23 NOTE — Assessment & Plan Note (Addendum)
Oncology follow up 

## 2022-01-23 NOTE — Assessment & Plan Note (Signed)
-  Nicotine patch 

## 2022-01-23 NOTE — Assessment & Plan Note (Addendum)
Mild, asymptomatic - Continue free water via PEG

## 2022-01-23 NOTE — Interval H&P Note (Signed)
History and Physical Interval Note:  01/23/2022 8:52 AM  Trevor [name]  has presented today for surgery, with the diagnosis of right pulmonary nodules.  The various methods of treatment have been discussed with the patient and family. After consideration of risks, benefits and other options for treatment, the patient has consented to  Procedure(s): ROBOTIC ASSISTED NAVIGATIONAL BRONCHOSCOPY (Right) as a surgical intervention.  The patient's history has been reviewed, patient examined, no change in status, stable for surgery.  I have reviewed the patient's chart and labs.  Questions were answered to the patient's satisfaction.     Collene Gobble

## 2022-01-23 NOTE — Anesthesia Preprocedure Evaluation (Addendum)
Anesthesia Evaluation  Patient identified by MRN, date of birth, ID band Patient awake    Reviewed: Allergy & Precautions, NPO status , Patient's Chart, lab work & pertinent test results  Airway Mallampati: III  TM Distance: >3 FB Neck ROM: Full    Dental  (+) Missing, Dental Advisory Given,    Pulmonary neg pulmonary ROS, Current Smoker and Patient abstained from smoking.,    Pulmonary exam normal breath sounds clear to auscultation       Cardiovascular hypertension, Pt. on medications Normal cardiovascular exam Rhythm:Regular Rate:Normal     Neuro/Psych negative neurological ROS  negative psych ROS   GI/Hepatic GERD  ,(+)     substance abuse  alcohol use, Esophageal CA   Endo/Other  diabetes  Renal/GU negative Renal ROS  negative genitourinary   Musculoskeletal negative musculoskeletal ROS (+)   Abdominal   Peds  Hematology  (+) Blood dyscrasia, anemia , Lab Results      Component                Value               Date                      WBC                      3.4 (L)             01/22/2022                HGB                      8.9 (L)             01/22/2022                HCT                      26.5 (L)            01/22/2022                MCV                      75.3 (L)            01/22/2022                PLT                      143 (L)             01/22/2022              Anesthesia Other Findings 51 year old man history of tobacco use, diabetes, hypertension and metastatic stage IV esophageal cancer.  He has a PEG in place.  Has been treated with chemotherapy, managed by Dr. Delton Coombes.  Treated for a focal right lower lobe pneumonia at the medial aspect of the right lung base.  He has had recurrent dyspnea, wheezing, cough prompting return to the ED.  Repeat CT chest now shows enlargement of the right medial basilar cavitary opacity, additional right lung nodular opacities of unclear cause   Reproductive/Obstetrics                            Anesthesia Physical Anesthesia Plan  ASA: 3  Anesthesia  Plan: General   Post-op Pain Management:    Induction: Intravenous  PONV Risk Score and Plan: 1 and Midazolam, Dexamethasone and Ondansetron  Airway Management Planned: Oral ETT  Additional Equipment:   Intra-op Plan:   Post-operative Plan: Extubation in OR  Informed Consent: I have reviewed the patients History and Physical, chart, labs and discussed the procedure including the risks, benefits and alternatives for the proposed anesthesia with the patient or authorized representative who has indicated his/her understanding and acceptance.     Dental advisory given  Plan Discussed with: CRNA  Anesthesia Plan Comments:         Anesthesia Quick Evaluation

## 2022-01-23 NOTE — Assessment & Plan Note (Addendum)
Bronch yesterday, cultures and path still pending.  HR still persistently >120s at times up to 140s with exertion.  Cultures no growth. - Continue vancomycin, meropenem and Flagyl - Follow culture data

## 2022-01-23 NOTE — Transfer of Care (Signed)
Immediate Anesthesia Transfer of Care Note  Patient: Trevor [name]  Procedure(s) Performed: ROBOTIC ASSISTED NAVIGATIONAL BRONCHOSCOPY (Right) VIDEO BRONCHOSCOPY WITH RADIAL ENDOBRONCHIAL ULTRASOUND BRONCHIAL NEEDLE ASPIRATION BIOPSIES BRONCHIAL BRUSHINGS BRONCHIAL BIOPSIES BRONCHIAL WASHINGS  Patient Location: PACU  Anesthesia Type:General  Level of Consciousness: drowsy and patient cooperative  Airway & Oxygen Therapy: Patient Spontanous Breathing  Post-op Assessment: Report given to RN and Post -op Vital signs reviewed and stable  Post vital signs: Reviewed and stable  Last Vitals:  Vitals Value Taken Time  BP 127/87 01/23/22 1050  Temp    Pulse 94 01/23/22 1051  Resp 14 01/23/22 1051  SpO2 100 % 01/23/22 1051  Vitals shown include unvalidated device data.  Last Pain:  Vitals:   01/23/22 0800  TempSrc:   PainSc: 0-No pain         Complications: No notable events documented.

## 2022-01-23 NOTE — Assessment & Plan Note (Addendum)
-   Continue Megace - Continue tube feeds

## 2022-01-23 NOTE — Progress Notes (Signed)
Pharmacy Antibiotic Note  Trevor [name] is a 51 y.o. male admitted on 01/21/2022 with concern for cavitary pneumonia vs lung abscess.  Pharmacy has been consulted for Vancomycin and Meropenem dosing.  Today is abx day # 3.  MRSA PCR negative, hopeful for d/c Vancomycin soon.  Pt is s/p bronch 10/2 with biopsies taken to eval for malignancy vs infection.  Afebrile.  WBC 3.4.  Plan: Continue Vancomycin 776m IV q24h Continue Meropenem 1gm IV q8h Follow-up cx data and clinical plans  Height: _0  (162.6 cm) Weight: 43 kg (94 lb 12.8 oz) IBW/kg (Calculated) : 59.2  Temp (24hrs), Avg:97.9 F (36.6 C), Min:97.2 F (36.2 C), Max:98.3 F (36.8 C)  Recent Labs  Lab 01/18/22 0828 01/21/22 0255 01/22/22 0244  WBC 3.5* 2.9* 3.4*  CREATININE 0.86 0.98 0.86    Estimated Creatinine Clearance: 60.4 mL/min (by C-G formula based on SCr of 0.86 mg/dL).    Allergies  Allergen Reactions   Pork-Derived Products Other (See Comments)    Patient does not eat pork due to his religious belief    Antimicrobials this admission: Vanc  9/30 >>  Meropenem 9/30 >>  Flagyl 9/30 >>   Dose adjustments this admission:   Microbiology results: 01/21/2022  BCx: ngtd 01/21/2022  Resp Panel: sent  01/21/2022  MRSA PCR: negative  01/21/2022 sputum cx - sent    10/2 BAL / bronch wash  Thank you for allowing pharmacy to be a part of this patient's care.  [employer] Pharm.D., BCPS Clinical Pharmacist Clinical phone for 01/23/2022 from 7:30-3:00 is [phone]  **Pharmacist phone directory can be found on [facility] listed under [place]  01/23/2022 1:10 PM

## 2022-01-23 NOTE — Assessment & Plan Note (Addendum)
Continue tube feeds.   

## 2022-01-23 NOTE — Anesthesia Procedure Notes (Signed)
Procedure Name: Intubation Date/Time: 01/23/2022 9:23 AM  Performed by: Lance Coon, CRNAPre-anesthesia Checklist: Patient identified, Emergency Drugs available, Suction available, Patient being monitored and Timeout performed Patient Re-evaluated:Patient Re-evaluated prior to induction Oxygen Delivery Method: Circle system utilized Preoxygenation: Pre-oxygenation with 100% oxygen Induction Type: IV induction Ventilation: Mask ventilation without difficulty Laryngoscope Size: Miller and 3 Grade View: Grade I Tube type: Oral Tube size: 8.5 mm Number of attempts: 1 Airway Equipment and Method: Stylet Placement Confirmation: ETT inserted through vocal cords under direct vision, positive ETCO2 and breath sounds checked- equal and bilateral Secured at: 21 cm Tube secured with: Tape Dental Injury: Teeth and Oropharynx as per pre-operative assessment

## 2022-01-23 NOTE — Progress Notes (Signed)
  Progress Note   Patient: Trevor [name] [id number]:[id number] DOB: [birth year] DOA: 01/21/2022     2 DOS: the patient was seen and examined on 01/23/2022 at 12:39PM      Brief hospital course: [name]  is a 51 y.o. male with past medical history relevant for tobacco abuse, alcohol abuse (quit in the summer 2023), HTN, stage IVb esophageal cancer with mets, status post PEG tube placement, failure to thrive/malnutrition who was diagnosed with cavitary pneumonia 12/15/2021 and treated with Augmentin and doxycycline.... By his oncologist Dr. Delton Coombes     Assessment and Plan: * Cavitary pneumonia/?? Lung Abscess Patient still tachycardic to 120s this afternoon, dyspneic with minimal exertion. S/p bronc by Pulmonology today. - Continue vancomycin, meropenem and Flagyl - Follow culture data from bronchoscopy today.  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  No chest pain, no fever, no confusion.     Physical Exam: BP 116/88 (BP Location: Left Arm)   Pulse 73   Temp 98.4 F (36.9 C) (Oral)   Resp 16   Ht '5\' 4"'$  (1.626 m)   Wt 43 kg   SpO2 95%   BMI 16.27 kg/m   Thin adult male, sitting on edge of bed.  No acute distress, eating biscuit. Tachycardic, regular, no murmurs, no peripheral edema Respiratory rate increased with exertion, diminished at the left base, no rales or wheezing Abdomen soft without tenderness palpation or guarding Attention normal, affect appropriate, judgment and insight appear normal  Data Reviewed: Discussed with pulmonology Sodium 131 Hemoglobin microcytic, down to  8.9       Disposition: Status is: Inpatient The patient presented with cavitary lung abscess, pulmonary path from his bronchoscopy today showed sepsis is not malignant but is infectious  He remains very tachycardic to the 120s and significantly dyspneic with exertion anxious just a few feet, so we will continue on IV antibiotics for today, reassess tomorrow        Author: Edwin Dada, MD 01/23/2022 3:19 PM  For on call review [email or web address].

## 2022-01-23 NOTE — Progress Notes (Signed)
Patient refused to remove clothing prior to transport for bronchoscopy.  Note placed in preprocedure checklist.

## 2022-01-24 ENCOUNTER — Ambulatory Visit (HOSPITAL_COMMUNITY): Payer: 59

## 2022-01-24 DIAGNOSIS — R1310 Dysphagia, unspecified: Secondary | ICD-10-CM | POA: Diagnosis not present

## 2022-01-24 DIAGNOSIS — E871 Hypo-osmolality and hyponatremia: Secondary | ICD-10-CM | POA: Diagnosis not present

## 2022-01-24 DIAGNOSIS — J189 Pneumonia, unspecified organism: Secondary | ICD-10-CM | POA: Diagnosis not present

## 2022-01-24 DIAGNOSIS — E876 Hypokalemia: Secondary | ICD-10-CM

## 2022-01-24 DIAGNOSIS — D500 Iron deficiency anemia secondary to blood loss (chronic): Secondary | ICD-10-CM

## 2022-01-24 LAB — CBC
HCT: 29.4 % — ABNORMAL LOW (ref 39.0–52.0)
Hemoglobin: 10.1 g/dL — ABNORMAL LOW (ref 13.0–17.0)
MCH: 25.3 pg — ABNORMAL LOW (ref 26.0–34.0)
MCHC: 34.4 g/dL (ref 30.0–36.0)
MCV: 73.7 fL — ABNORMAL LOW (ref 80.0–100.0)
Platelets: 228 10*3/uL (ref 150–400)
RBC: 3.99 MIL/uL — ABNORMAL LOW (ref 4.22–5.81)
RDW: 22.6 % — ABNORMAL HIGH (ref 11.5–15.5)
WBC: 8.4 10*3/uL (ref 4.0–10.5)
nRBC: 0 % (ref 0.0–0.2)

## 2022-01-24 LAB — BASIC METABOLIC PANEL
Anion gap: 11 (ref 5–15)
BUN: 10 mg/dL (ref 6–20)
CO2: 18 mmol/L — ABNORMAL LOW (ref 22–32)
Calcium: 9 mg/dL (ref 8.9–10.3)
Chloride: 108 mmol/L (ref 98–111)
Creatinine, Ser: 0.93 mg/dL (ref 0.61–1.24)
GFR, Estimated: >60 mL/min (ref >60)
Glucose, Bld: 103 mg/dL — ABNORMAL HIGH (ref 70–99)
Potassium: 3.4 mmol/L — ABNORMAL LOW (ref 3.5–5.1)
Sodium: 137 mmol/L (ref 135–145)

## 2022-01-24 LAB — ACID FAST SMEAR (AFB, MYCOBACTERIA)
Acid Fast Smear: NEGATIVE
Acid Fast Smear: NEGATIVE
Acid Fast Smear: NEGATIVE

## 2022-01-24 MED ORDER — POTASSIUM CHLORIDE CRYS ER 20 MEQ PO TBCR
40.0000 meq | EXTENDED_RELEASE_TABLET | Freq: Once | ORAL | Status: AC
  Administered 2022-01-24: 40 meq via ORAL
  Filled 2022-01-24: qty 2

## 2022-01-24 MED ORDER — FREE WATER
120.0000 mL | Freq: Every day | Status: DC
  Administered 2022-01-24 – 2022-01-25 (×2): 120 mL

## 2022-01-24 MED ORDER — OSMOLITE 1.5 CAL PO LIQD
237.0000 mL | Freq: Every day | ORAL | Status: DC
  Filled 2022-01-24 (×11): qty 237

## 2022-01-24 NOTE — Progress Notes (Signed)
Mobility Specialist Progress Note:   01/24/22 1500  Mobility  Activity Ambulated with assistance in hallway  Activity Response Tolerated well  Distance Ambulated (ft) 200 ft  $Mobility charge 1 Mobility  Level of Assistance Contact guard assist, steadying assist  Assistive Device None  Mobility Referral Yes   Pt received EOB willing to participate in mobility. No complaints of pain. Left in bed with call bell in reach and all needs met.    [facility] Surveyor, mining Chat only

## 2022-01-24 NOTE — Assessment & Plan Note (Signed)
-   Supp K 

## 2022-01-24 NOTE — Progress Notes (Signed)
  Progress Note   Patient: Trevor [name] [id number]:[id number] DOB: [birth year] DOA: 01/21/2022     3 DOS: the patient was seen and examined on 01/24/2022 at 9:18AM      Brief hospital course: Trevor [name] is a 50 y.o. 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      8/7: Had new cough, treated with steroid taper by PCP 8/24: ER visit for persistent fatigue, dyspnea, CT chest shows R base infiltrate with cavitation, treated with azithroymcin 9/3: Completed FOLFOX/nivolumab  9/13: Oncology visit for ongoing productive cough, fatigue, dyspnea, treated with Augmentin/doxy for 5 days      9/30: Symptoms worsening, so admitted and transferred from [facility] to [facility] for bronchoscopy 10/1: Pulm consulted 10/2: Underwent bronchoscopy with frozen path negative for malignancy        Assessment and Plan: * Cavitary pneumonia/?? Lung Abscess Bronch yesterday, cultures and path still pending.  HR still persistently >120s at times up to 140s with exertion.  Cultures no growth. 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     Physical Exam: BP 109/83 (BP Location: Left Arm)   Pulse 68   Temp 98.5 F (36.9 C) (Oral)   Resp 17   Ht '5\' 4"'$  (1.626 m)   Wt 43 kg   SpO2 99%   BMI 16.27  kg/m   Thin adult male, lying in bed, appears weak and tired Tachycardic, no murmurs, no peripheral edema JVP normal Respiratory rate normal, lungs clear without rales or wheezes Abdomen soft without tenderness palpation or guarding, PEG tube in place Attention normal, affect blunted, judgment insight appear normal, generalized weakness but symmetric strength, face symmetric, speech fluent    Data Reviewed: Patient metabolic panel shows mild hypokalemia, normal sodium and creatinine Hemogram shows mild anemia, no change from yesterday  Family Communication: None present   Disposition: Status is: Inpatient The patient was admitted for lung abscess, failed outpatient treatment, requiring bronchoscopy for targeted evaluation.  He had a bronchoscopy, but remains significantly tachycardic with and symptomatic with ambulating short distances  We will continue IV antibiotics, reevaluate tomorrow for possible d/c       Author: Edwin Dada, MD 01/24/2022 12:55 PM  For on call review [email or web address].

## 2022-01-24 NOTE — Consult Note (Signed)
   [facility] Inpatient Consult   01/24/2022  Iris Tatsch May 24, 1970 [id number]  [place] [facility] [ACO] Patient:  Primary Care Provider:  [name], [name], at [facility], which has a Care Coordination team available.   Patient screened for hospitalization with noted high risk score for unplanned readmission risk and to assess for potential [facility] [employer] community care service needs for post hospital transition. Met with the inpatient [facility] RNCM regarding disposition.  Patient had concerns for wanting a lower rent cost due to recent disability status being active. Met with the patient at the bedside regarding follow up needs for care coordination/disease management.  Patient's main concern is for desiring a lower cost rental states, "I currently pay $500.00/month for a two bedroom and I really don't need 2 bedrooms.Marland Kitchenit's just me there."  Explained that follow up could be done and his rental seems to be on the lower end.  Patient states, "we'll I just got approved for my disability and haven't received the check yet to pay the rent." Patient states he has children that can visit. Encouraged patient to speak with his landlord and see if willing to work with his financial situation. He also requested to speak with a Education officer, museum.  Informed inpatient TOC RNCM of the information given.  Patient agrees with [place] Team to follow up as well.  Plan:  Continue to follow progress and disposition to assess for post hospital care management needs.  Referral for community follow up can be planned for closer to transitioning home.  For questions contact:   Natividad Brood, RN BSN [facility] Liaison  [phone] business mobile phone Toll free office [phone]  Fax number: [phone] [email or web address]

## 2022-01-24 NOTE — Progress Notes (Signed)
Pt has continued to refuse Osmolite. Pt stated " It gives me diarrhea". Pt tried to have oral intake, but had problems with nausea and vomiting x 2 on day shift per RN reported.   He is hemodynamically stable at rest, afebrile, no distress noted. However when he ambulated today, his HR was up to 140 detected by O2 finger probe per RN reported. Pt was well tolerated walking independently in his room. Denied SOB or palpitations. We will continue to monitor.  Kennyth Lose, RN

## 2022-01-24 NOTE — Evaluation (Signed)
Physical Therapy Evaluation Patient Details Name: Trevor [name] MRN: [id number] DOB: [birth year] Today's Date: 01/24/2022  History of Present Illness  51 y.o. male admitted 9/30 with cavitary PNA. 10/2 bronch. PMHx: tobacco abuse, alcohol abuse (quit in the summer 2023), HTN, stage IVb esophageal CA with mets, s/p PEG tube, FTT, gout  Clinical Impression  Pt pleasant, moving well despite weakness and fatigue. Pt lives alone and brother has been providing transportation, assisting with stairs, grocery shopping and cleaning at home. Pt reports independent and working prior to chemo and gradual decline for a few weeks due to fatigue with pt reporting LOB and falling into the wall in the bathroom at home. Pt with decreased strength, balance and function who will benefit from acute therapy to maximize mobility and safety to decrease fall risk and burden of care.  HR 100-140 with activity SPO2 98-99% on RA       Recommendations for follow up therapy are one component of a multi-disciplinary discharge planning process, led by the attending physician.  Recommendations may be updated based on patient status, additional functional criteria and insurance authorization.  Follow Up Recommendations Home health PT      Assistance Recommended at Discharge PRN  Patient can return home with the following  A little help with walking and/or transfers;Assistance with cooking/housework;Assist for transportation;Help with stairs or ramp for entrance    Equipment Recommendations Rolling walker (2 wheels)  Recommendations for Other Services       Functional Status Assessment Patient has had a recent decline in their functional status and demonstrates the ability to make significant improvements in function in a reasonable and predictable amount of time.     Precautions / Restrictions Precautions Precautions: Fall      Mobility  Bed Mobility               General bed mobility comments: EOb on arrival     Transfers Overall transfer level: Modified independent                      Ambulation/Gait Ambulation/Gait assistance: Supervision Gait Distance (Feet): 250 Feet Assistive device: 1 person hand held assist Gait Pattern/deviations: Step-through pattern, Decreased stride length   Gait velocity interpretation: 1.31 - 2.62 ft/sec, indicative of limited community ambulator   General Gait Details: pt with partial knee flexion in gait and one episode of partial buckle without fall. Pt then walked 20' in room with RW with mod I  Stairs Stairs: Yes Stairs assistance: Min guard Stair Management: One rail Left, Step to pattern, Forwards Number of Stairs: 6 General stair comments: pt initially with alternating pattern then transitioned to step to pattern after 3 steps due to fatigue and bil LE weakness. guarding on stairs with partial buckling on descent  Wheelchair Mobility    Modified Rankin (Stroke Patients Only)       Balance Overall balance assessment: Needs assistance   Sitting balance-Leahy Scale: Normal       Standing balance-Leahy Scale: Good                               Pertinent Vitals/Pain Pain Assessment Pain Assessment: No/denies pain    Home Living Family/patient expects to be discharged to:: Private residence Living Arrangements: Alone Available Help at Discharge: Family;Available PRN/intermittently Type of Home: Apartment Home Access: Stairs to enter   Entrance Stairs-Number of Steps: 6   Home Layout: One level Home Equipment: None  Prior Function Prior Level of Function : Independent/Modified Independent             Mobility Comments: pt walks without assist at baseline and was a cook at [employer] ADLs Comments: sponge bathing and simple meals for the last few weeks due to fatigue     Hand Dominance        Extremity/Trunk Assessment   Upper Extremity Assessment Upper Extremity Assessment: Generalized weakness     Lower Extremity Assessment Lower Extremity Assessment: Generalized weakness (partial knee buckling on stairs, not formally tested)    Cervical / Trunk Assessment Cervical / Trunk Assessment: Normal;Other exceptions Cervical / Trunk Exceptions: cachectic  Communication   Communication: No difficulties  Cognition Arousal/Alertness: Awake/alert Behavior During Therapy: WFL for tasks assessed/performed Overall Cognitive Status: Within Functional Limits for tasks assessed                                          General Comments      Exercises     Assessment/Plan    PT Assessment Patient needs continued PT services  PT Problem List Decreased strength;Decreased mobility;Decreased activity tolerance;Decreased balance;Decreased knowledge of use of DME       PT Treatment Interventions Gait training;Stair training;Functional mobility training;Therapeutic activities;Patient/family education;DME instruction;Therapeutic exercise    PT Goals (Current goals can be found in the Care Plan section)  Acute Rehab PT Goals Patient Stated Goal: be able to cook PT Goal Formulation: With patient Time For Goal Achievement: 02/07/22 Potential to Achieve Goals: Good    Frequency Min 3X/week     Co-evaluation               AM-PAC PT "6 Clicks" Mobility  Outcome Measure Help needed turning from your back to your side while in a flat bed without using bedrails?: None Help needed moving from lying on your back to sitting on the side of a flat bed without using bedrails?: None Help needed moving to and from a bed to a chair (including a wheelchair)?: None Help needed standing up from a chair using your arms (e.g., wheelchair or bedside chair)?: None Help needed to walk in hospital room?: A Little Help needed climbing 3-5 steps with a railing? : A Little 6 Click Score: 22    End of Session   Activity Tolerance: Patient tolerated treatment well Patient left: in  bed;with call bell/phone within reach;with nursing/sitter in room Nurse Communication: Mobility status PT Visit Diagnosis: Other abnormalities of gait and mobility (R26.89);Difficulty in walking, not elsewhere classified (R26.2);Muscle weakness (generalized) (M62.81)    Time: 0947-1000 PT Time Calculation (min) (ACUTE ONLY): 13 min   Charges:   PT Evaluation $PT Eval Moderate Complexity: 1 Mod          Aloha Bartok P, PT Acute Rehabilitation Services Office: [phone]   Amandamarie Feggins B Sameena Artus 01/24/2022, 10:08 AM

## 2022-01-24 NOTE — Progress Notes (Signed)
PT Cancellation Note  Patient Details Name: Trevor [name] MRN: [id number] DOB: 17-Jun-1970   Cancelled Treatment:    Reason Eval/Treat Not Completed: Patient declined, no reason specified (pt currently refusing mobility until after he eats breakfast)   Zohan Shiflet B Deacon Gadbois 01/24/2022, 7:49 AM [place] Office: [phone]

## 2022-01-24 NOTE — Anesthesia Postprocedure Evaluation (Signed)
Anesthesia Post Note  Patient: Trevor [name]  Procedure(s) Performed: ROBOTIC ASSISTED NAVIGATIONAL BRONCHOSCOPY (Right) VIDEO BRONCHOSCOPY WITH RADIAL ENDOBRONCHIAL ULTRASOUND BRONCHIAL NEEDLE ASPIRATION BIOPSIES BRONCHIAL BRUSHINGS BRONCHIAL BIOPSIES BRONCHIAL WASHINGS     Patient location during evaluation: PACU Anesthesia Type: General Level of consciousness: awake and alert Pain management: pain level controlled Vital Signs Assessment: post-procedure vital signs reviewed and stable Respiratory status: spontaneous breathing, nonlabored ventilation, respiratory function stable and patient connected to nasal cannula oxygen Cardiovascular status: blood pressure returned to baseline and stable Postop Assessment: no apparent nausea or vomiting Anesthetic complications: no   No notable events documented.  Last Vitals:  Vitals:   01/23/22 1658 01/23/22 2320  BP: 99/81 106/67  Pulse: 89 69  Resp: 16 17  Temp: 36.9 C 36.6 C  SpO2: 100% 100%    Last Pain:  Vitals:   01/23/22 2320  TempSrc: Oral  PainSc: 0-No pain                 Mena Lienau L Deaisa Merida

## 2022-01-24 NOTE — TOC Initial Note (Signed)
Transition of Care (TOC) - Initial/Assessment Note  Marvetta Gibbons RN, BSN Transitions of Care Unit 4E- RN Case Manager See Treatment Team for direct phone #    Patient Details  Name: Trevor [name] MRN: [id number] Date of Birth: 1970-10-03  Transition of Care [facility]) CM/SW Contact:    Dawayne Patricia, RN Phone Number: 01/24/2022, 3:14 PM  Clinical Narrative:                 Noted PT recommendations for [facility] and DME- order for RW has been placed per protocol. Pt will need HH orders placed by MD prior to discharge.   CM in to speak with pt at bedside. Discussed HH and DME recs- pt agreeable to both [facility] and DME- Discussed options and list provided for choice Per CMS guidelines from medicare.gov website with star ratings (copy placed in shadow chart), pt voiced he did not have a preference and will defer to [facility] to secure an in-network provider for services. Pt agreeable to use in house provider for DME needs.   Address, phone # and PCP all confirmed with pt in epic. Pt reports family checks on him intermittently, he will have transportation home.  Pt is asking about lower cost housing options- unfortunately we do not have any resources to provide pt and have advised pt to f/u with DSS in his county. Pt does report that he just got approved for LT disability, but has not received a check yet. Pt connected w/ [facility] as well- spoke with [facility] hospital liaison who will also f/u with pt.   Call made to Amy w/ [facility] for [facility] referral- referral has been accepted for [facility] needs- pending MD orders   CM will contact [employer] for DME- RW and have delivered to room prior to discharge  Expected Discharge Plan: [place] Barriers to Discharge: No Barriers Identified   Patient Goals and CMS Choice Patient states their goals for this hospitalization and ongoing recovery are:: return home CMS Medicare.gov Compare Post Acute Care list provided to:: Patient Choice offered to / list presented to :  Patient  Expected Discharge Plan and Services Expected Discharge Plan: [place]   Discharge Planning Services: CM Consult Post Acute Care Choice: Durable Medical Equipment, Home Health Living arrangements for the past 2 months: Apartment                 DME Arranged: [place]ford Rile rolling DME Agency: [employer] Date DME Agency Contacted: 01/24/22 Time DME Agency Contacted: [phone] Representative spoke with at DME Agency: [place]: [place] Date [place]: 01/24/22 Time [place]: 69 Representative spoke with at [place] Arrangements/Services Living arrangements for the past 2 months: [place] with:: Self Patient language and need for interpreter reviewed:: Yes Do you feel safe going back to the place where you live?: Yes      Need for Family Participation in Patient Care: Yes (Comment)     Criminal Activity/Legal Involvement Pertinent to Current Situation/Hospitalization: No - Comment as needed  Activities of Daily Living      Permission Sought/Granted Permission sought to share information with : Facility Art therapist granted to share information with : Yes, Verbal Permission Granted     Permission granted to share info w AGENCY: HH/DME        Emotional Assessment Appearance:: Appears stated age Attitude/Demeanor/Rapport: Engaged Affect (typically observed): Accepting, Appropriate Orientation: : Oriented to Self,  Oriented to Place, Oriented to  Time, Oriented to Situation Alcohol / Substance Use: Not Applicable Psych Involvement: No (comment)  Admission diagnosis:  Pneumonia [J18.9] CAP (community acquired pneumonia) [J18.9] Community acquired pneumonia of right lower lobe of lung [J18.9] Patient Active Problem List   Diagnosis Date Noted   Hyponatremia 01/23/2022   Pulmonary nodules/lesions, multiple    Cavitary pneumonia/?? 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 ([place]) 09/13/2021   Visual disturbance 08/18/2021   Dysphagia 08/18/2021   Vitamin D deficiency 05/31/2021   Hypomagnesemia 05/31/2021   Hypokalemia 05/30/2021   HTN (hypertension) 05/30/2021   Tachycardia, unspecified 05/30/2021   Gout 05/30/2021   GERD (gastroesophageal reflux disease) 05/30/2021   Alcohol abuse 05/30/2021   Tobacco abuse 05/30/2021   PCP:  [name], [name] Pharmacy:   [place] [address] Phone: [phone] Fax: [phone]  [id number] [address] [facility] [place]Phone: [phone]: [phone]    Social Determinants of Health (SDOH) Interventions    Readmission Risk Interventions    01/24/2022    3:13 PM  Readmission Risk Prevention Plan  Transportation Screening Complete  PCP or Specialist Appt within 3-5 Days Complete  [place] or [place]  Social Work Consult for [place]/Counseling Complete  Palliative Care Screening Complete  Medication Review (Press photographer Complete

## 2022-01-24 NOTE — Progress Notes (Addendum)
Nutrition Follow-up  DOCUMENTATION CODES:   Severe malnutrition in context of chronic illness, Underweight  INTERVENTION:  Discontinued Dillard [place] Standard 1.4 as pt reports he is not tolerating.  Provide Osmolite 1.5 Cal 1 carton bolus 6 times daily per G-tube. Provides: 2130 kcal, 89.4 grams protein, 1086 mL H2O daily.  Flush tube with 60 mL before and after each bolus tube feeding. This provides a total of 1806 mL H2O daily.  Downgrade diet to dysphagia 1 with thin liquids as pt reports he tolerates pureed foods best by mouth.  Provide [employer] Essentials with whole milk po TID with meals, each supplement provides 290 kcal and 13 grams of protein.  Addendum: Notified by RN that patient became upset when dysphagia 1 tray was delivered. RN was present in room this AM when patient requested dysphagia 1 diet as he eats pureed foods at home. He is not requesting regular diet, though he stated this morning he cannot eat a regular diet and can only eat purees.  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  New nutrition diagnosis this admission.  GOAL:   Patient will meet greater than or equal to 90% of their needs  Progressing.  MONITOR:   PO intake, TF tolerance, Labs, Weight trends, I & O's  REASON FOR ASSESSMENT:   Consult Assessment of nutrition requirement/status  ASSESSMENT:   51 y.o. 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 Patient was seen in the ED on 8/24 due to persistent productive cough. At that time, CT showed a mass vs cavitary PNA in R lung base. He was started on abx. He was seen by Oncology on 9/13 at which time he had not improved. He returned to the ED on 9/30 due to coughing with inability to catch his  breath.  Met with patient at bedside. He reports he is not tolerating Dillard [place] Standard 1.4 and also had not been tolerating it at home or here in setting of nausea and increased frequency of bowel movements and diarrhea. He reports when he is home alone he can't get to the bathroom on time. He has refused tube feeds yesterday and this morning. He reports at home he tolerates Nutren better, though he knows it is lower calorie. Suspect may be related to fiver content of [employer]. Patient would like to transition to Osmolite 1.5 while in the hospital. Dillard Essex can always be slowly reintroduced in outpatient setting as tolerated. He is followed closely by outpatient RD. Pt is receiving regular diet, but he reports he tolerates pureed diet better. Plan is to downgrade to dysphagia 1 diet with thin liquids. He also requests Carnation Breakfast Essentials in whole milk to be sent on his trays (vanilla or strawberry). Pt reports he provides additional water flush per tube pending oral intake of fluids.  No wt taken since admission to trend.  I/O:+1618.6 mL since admission UOP:1 occurrence unmeasured UOP yesterday  Medications reviewed and include: allopurinol, Dillard [place] Standard 1.4 5 times daily, free water 100 mL 5 times daily, Megace, vitamin D 50000 units once weekly, meropenem, Flagyl, vancomycin.  Labs reviewed: Potassium 3.4, CBG 98 01/23/22 1052  Discussed with RN.  NUTRITION - FOCUSED PHYSICAL EXAM:  Flowsheet Row Most Recent Value  Orbital Region Severe depletion  Upper Arm Region Severe depletion  Thoracic and Lumbar Region Severe depletion  Buccal Region Severe depletion  Temple Region Severe depletion  Clavicle Bone Region Severe depletion  Clavicle and Acromion Bone Region Severe depletion  Scapular Bone Region Severe depletion  Dorsal Hand Severe depletion  Patellar Region Severe depletion  Anterior Thigh Region Severe depletion  Posterior Calf Region Severe depletion   Edema (RD Assessment) None  Hair Reviewed  Eyes Reviewed  Mouth Reviewed  [Poor dentition]  Skin Reviewed  Nails Reviewed       Diet Order:   Diet Order             DIET - DYS 1 Room service appropriate? Yes; Fluid consistency: Thin  Diet effective now                   EDUCATION NEEDS:   No education needs have been identified at this time  Skin:  Skin Assessment: Reviewed RN Assessment  Last BM:  01/23/22 stool characteristics not documented  Height:   Ht Readings from Last 1 Encounters:  01/21/22 _0  (1.626 m)    Weight:   Wt Readings from Last 1 Encounters:  01/21/22 43 kg    Ideal Body Weight:  59.1 kg  BMI:  Body mass index is 16.27 kg/m.  Estimated Nutritional Needs:   Kcal:  2100-2400  Protein:  100-120 grams  Fluid:  >/= 2 L/day  Loanne Drilling, MS, RD, LDN, CNSC Pager number available on Amion

## 2022-01-25 ENCOUNTER — Telehealth: Payer: Self-pay | Admitting: Physician Assistant

## 2022-01-25 ENCOUNTER — Other Ambulatory Visit (HOSPITAL_COMMUNITY): Payer: Self-pay

## 2022-01-25 ENCOUNTER — Encounter (HOSPITAL_COMMUNITY): Payer: Self-pay | Admitting: Hematology

## 2022-01-25 ENCOUNTER — Encounter: Payer: Self-pay | Admitting: Hematology

## 2022-01-25 DIAGNOSIS — C159 Malignant neoplasm of esophagus, unspecified: Secondary | ICD-10-CM | POA: Diagnosis not present

## 2022-01-25 DIAGNOSIS — J189 Pneumonia, unspecified organism: Secondary | ICD-10-CM | POA: Diagnosis not present

## 2022-01-25 DIAGNOSIS — R531 Weakness: Secondary | ICD-10-CM | POA: Diagnosis not present

## 2022-01-25 DIAGNOSIS — Z720 Tobacco use: Secondary | ICD-10-CM | POA: Diagnosis not present

## 2022-01-25 DIAGNOSIS — E871 Hypo-osmolality and hyponatremia: Secondary | ICD-10-CM | POA: Diagnosis not present

## 2022-01-25 DIAGNOSIS — J984 Other disorders of lung: Secondary | ICD-10-CM | POA: Diagnosis not present

## 2022-01-25 DIAGNOSIS — R918 Other nonspecific abnormal finding of lung field: Secondary | ICD-10-CM | POA: Diagnosis not present

## 2022-01-25 DIAGNOSIS — E876 Hypokalemia: Secondary | ICD-10-CM | POA: Diagnosis not present

## 2022-01-25 DIAGNOSIS — R1310 Dysphagia, unspecified: Secondary | ICD-10-CM | POA: Diagnosis not present

## 2022-01-25 LAB — CULTURE, BAL-QUANTITATIVE W GRAM STAIN
Culture: NO GROWTH
Culture: NO GROWTH

## 2022-01-25 LAB — CYTOLOGY - NON PAP

## 2022-01-25 MED ORDER — FREE WATER: 120.0000 mL | Freq: Every day | Status: DC

## 2022-01-25 MED ORDER — AMOXICILLIN-POT CLAVULANATE 400-57 MG/5ML PO SUSR
800.0000 mg | Freq: Two times a day (BID) | ORAL | 0 refills | Status: AC
  Filled 2022-01-25: qty 200, 10d supply, fill #0

## 2022-01-25 MED ORDER — OSMOLITE 1.5 CAL PO LIQD
237.0000 mL | Freq: Every day | ORAL | 1 refills | Status: DC
  Filled 2022-01-25: qty 35550, 30d supply, fill #0

## 2022-01-25 MED ORDER — ALBUTEROL SULFATE (2.5 MG/3ML) 0.083% IN NEBU: 2.5000 mg | INHALATION_SOLUTION | RESPIRATORY_TRACT | Status: DC | PRN

## 2022-01-25 MED ORDER — AMOXICILLIN-POT CLAVULANATE 875-125 MG PO TABS
1.0000 | ORAL_TABLET | Freq: Two times a day (BID) | ORAL | 0 refills | Status: DC
  Filled 2022-01-25: qty 56, 28d supply, fill #0

## 2022-01-25 NOTE — Progress Notes (Signed)
Discharge instructions (including medications) discussed with and copy provided to patient/caregiver 

## 2022-01-25 NOTE — Progress Notes (Signed)
Mobility Specialist Progress Note:   01/25/22 0953  Mobility  Activity Ambulated with assistance in hallway  Activity Response Tolerated well  Distance Ambulated (ft) 450 ft  $Mobility charge 1 Mobility  Level of Assistance Modified independent, requires aide device or extra time  Assistive Device None   Pt received in bed willing to participate in mobility. No complaints of pain. Left in bed with call bell in reach and all needs met.   [facility] Surveyor, mining Chat only

## 2022-01-25 NOTE — TOC Transition Note (Addendum)
Transition of Care (TOC) - CM/SW Discharge Note Marvetta Gibbons RN, BSN Transitions of Care Unit 4E- RN Case Manager See Treatment Team for direct phone #    Patient Details  Name: Parv Manthey MRN: [id number] Date of Birth: 1971-03-25  Transition of Care [facility]) CM/SW Contact:  Dawayne Patricia, RN Phone Number: 01/25/2022, 11:11 AM   Clinical Narrative:    Pt stable for transition home today. HH orders have been placed for PT/OT- CM notified Amy w/ [facility] and referral is being processed- [facility] will f/u post discharge to schedule start of care. Pt aware and understands visits are for therapy only, any additional assistance for house cleaning, etc he will need to find and cover any cost on his own.   Pt has transportation available for later today, DME- RW has been delivered to the room.   Pt to change over TF via PEG to Osmolite- CM spoke with pt and prior to admit he was getting his TF from the [address], he states he was never been set up with a supplier/pharmacy. Confirmed that pt wants to change over to Osmolite, will plan to have bedside RN provide pt with some cartons to take home with him, and pt agreeable to have script sent to local provider to have shipped to his home. CM will contact [place] regarding home TF needs.   Call made to [facility] w/ [employer]- awaiting return call.  1145- received return call from [facility]- discussed home TF needs, Pam to check on insurance and follow up.  1155- Per Pam they are OON w/ [employer] plan and are unable to service for TF needs- she has forwarded request to [employer] w/ [employer] to see if they are in network- awaiting return msg from  [place].        Final next level of care: [place] Barriers to Discharge: No Barriers Identified   Patient Goals and CMS Choice Patient states their goals for this hospitalization and ongoing recovery are:: return home CMS Medicare.gov Compare Post Acute Care list provided to::  Patient Choice offered to / list presented to : Patient  Discharge Placement               Home w/ [facility]        Discharge Plan and Services   Discharge Planning Services: CM Consult Post Acute Care Choice: Durable Medical Equipment, Home Health          DME Arranged: [place] rolling DME Agency: [employer] Date DME Agency Contacted: 01/24/22 Time DME Agency Contacted: [phone] Representative spoke with at DME Agency: [place] Cipro HH Arranged: PT, OT HH Agency: [place] Date [place]: 01/24/22 Time [place]: [phone] Representative spoke with at [place] Agency: [place] ([place]) Interventions     Readmission Risk Interventions    01/24/2022    3:13 PM  Readmission Risk Prevention Plan  Transportation Screening Complete  PCP or Specialist Appt within 3-5 Days Complete  HRI or [place] Complete  Social Work Consult for [place] Planning/Counseling Complete  Palliative Care Screening Complete  Medication Review Press photographer) Complete

## 2022-01-25 NOTE — Discharge Summary (Signed)
Physician Discharge Summary   Patient: Trevor [name] MRN: [id number] DOB: 07-May-1970  Admit date:     01/21/2022  Discharge date: 01/25/22  Discharge Physician: Edwin Dada   PCP: [name], [name]     Recommendations at discharge:  Follow up with Oncology Dr. Delton Coombes in 1 week Dr. Delton Coombes: Please follow up biopsy and culture results from 10/2 bronchoscopy by Dr. Lamonte Sakai Dr. 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 Bean is a 51 y.o. 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      8/7: Had new cough, treated with steroid taper by PCP 8/24: ER visit for persistent fatigue, dyspnea, CT chest shows R base infiltrate with cavitation, treated with azithroymcin 9/3: Completed FOLFOX/nivolumab  9/13: Oncology visit for ongoing productive cough, fatigue, dyspnea, treated with Augmentin/doxy for 5 days  9/30: Symptoms worsening, so admitted and transferred from [facility] to [facility] for bronchoscopy     * Suspected lung Abscess Patient was admitted and pulmonology were consulted.  He underwent bronchoscopy on 10/2, frozen slides negative for malignancy.  Cultures pending at the time of discharge with no growth.  He was discharged with 4 weeks of Augmentin, close follow-up with oncology to follow-up bronchoscopy results and coordinate repeat imaging versus outpatient referral to pulmonology.        Stage IV B Squamous cell esophageal cancer  Pulmonary nodules/lesions, multiple - Oncology follow up    Protein-calorie malnutrition, severe Patient refused tube feeds in the hospital, was told clearly on more than one occasion that he needed tube feeding for regaining weight, and that his oral intake was not sufficient to get the intake of calories he needed nor the fluids he needed.               The [facility] Controlled Substances Registry was reviewed for this patient prior to discharge.   Consultants: Pulmonology Procedures performed: Bronchoscopy  Disposition: Home with home health Diet recommendation: Tube feeds and free water   DISCHARGE MEDICATION: Allergies as of 01/25/2022       Reactions   Pork-derived Products Other (See Comments)   Patient does not eat pork due to his religious belief        Medication List     STOP taking these medications    amoxicillin-clavulanate 600-42.9 MG/5ML suspension Commonly known as: AUGMENTIN Replaced by: amoxicillin-clavulanate 875-125 MG tablet   doxycycline 100 MG capsule Commonly known as: VIBRAMYCIN   ibuprofen 600 MG tablet Commonly known as: ADVIL   [employer] Standard 1.4 Liqd Replaced by: feeding supplement (OSMOLITE 1.5 CAL) Liqd   methylPREDNISolone 4 MG Tbpk tablet Commonly known as: MEDROL DOSEPAK   nicotine 21 mg/24hr patch Commonly known as: NICODERM CQ - dosed in mg/24 hours       TAKE these medications    albuterol 108 (90 Base) MCG/ACT inhaler Commonly known as: VENTOLIN HFA Inhale 1-2 puffs into the lungs  every 6 (six) hours as needed for wheezing or shortness of breath.   allopurinol 300 MG tablet Commonly known as: ZYLOPRIM Take 1 tablet (300 mg total) by mouth daily.   amLODipine 5 MG tablet Commonly known as: NORVASC Take 1 tablet (5 mg total) by mouth daily.   amoxicillin-clavulanate 875-125 MG tablet Commonly known as: AUGMENTIN Take 1 tablet by mouth 2  (two) times daily for 28 days. Replaces: amoxicillin-clavulanate 600-42.9 MG/5ML suspension   feeding supplement (OSMOLITE 1.5 CAL) Liqd Place 237 mLs into feeding tube 5 (five) times daily. Replaces: [employer] [place] Standard 1.4 Liqd   fluorouracil CALGB 57846 2,400 mg/m2 in sodium chloride 0.9 % 150 mL Inject 2,400 mg/m2 into the vein over 48 hr. Every 2 weeks   FLUOROURACIL IV Inject into the vein every 14 (fourteen) days.   free water Soln Place 120 mLs into feeding tube 6 (six) times daily.   Klor-Con M20 20 MEQ tablet Generic drug: potassium chloride SA TAKE 2 TABLETS BY MOUTH DAILY   LEUCOVORIN CALCIUM IV Inject into the vein every 14 (fourteen) days.   loperamide 2 MG capsule Commonly known as: IMODIUM Take 1 capsule (2 mg total) by mouth as needed for diarrhea or loose stools (take as directed according to the instruction sheet you were given).   magnesium oxide 400 (240 Mg) MG tablet Commonly known as: MAG-OX TAKE 1 TABLET BY MOUTH TWICE A DAY   megestrol 400 MG/10ML suspension Commonly known as: MEGACE Take 10 mLs (400 mg total) by mouth 2 (two) times daily.   OXALIPLATIN IV Inject into the vein every 14 (fourteen) days.   prochlorperazine 10 MG tablet Commonly known as: COMPAZINE Take 1 tablet (10 mg total) by mouth every 6 (six) hours as needed for nausea or vomiting.   Vitamin D (Ergocalciferol) 1.25 MG (50000 UNIT) Caps capsule Commonly known as: DRISDOL Take 1 capsule (50,000 Units total) by mouth every Monday.               Durable Medical Equipment  (From admission, onward)           Start     Ordered   01/24/22 1003  For home use only DME Walker rolling  Once       Question Answer Comment  Walker: With 5 Inch Wheels   Patient needs a walker to treat with the following condition Difficulty in walking, not elsewhere classified      01/24/22 I[place]. Follow up.    Why: Latricia Heft)- HHPT/OT arranged- they will contact you to schedule home visits Contact information: [place] [address] [phone]         Llc, [facility] Follow up.   Why: ([employer])- rolling walker arranged- to be delivered to room prior to discharge Contact information: [place] [address] [phone]         Llc, [facility] .   Contact information: [address] [address] [phone]         [name], [place]. Schedule an appointment as soon as possible for a visit in 1 week(s).   Specialty: Family Medicine Contact information: [address] [place] [address] [phone]         Derek Jack, MD. Schedule an appointment as soon as possible for a visit in 1 week(s).  Specialty: Hematology Contact information: [address] [place] [address] [phone]                 Discharge Instructions     Discharge instructions   Complete by: As directed    You were admitted for lung absess  You should take Augmentin 875-125 mg twice daily for 4 weeks Follow up with Dr. Delton Coombes It would be wise to follow up with your primary care doctor as well  Ask Dr. Delton Coombes for the results of your bronchoscopy (the procedure in the hospital where they sampled the mass in your lung)   Ask him if you should stop taking the antibiotics before 4 weeks, at 4 weeks or continue for longer than 4 weeks  Take the Osmolite liquid 5 cans per day Eat food for comfort, but not for nutrition  Add water to the tube as well, not just the cans.  Give 120 ml of water (1/2 cup) of water 5 timse daily   Increase activity slowly   Complete by: As directed        Discharge Exam: Filed Weights   01/21/22 0225  Weight: 43 kg    General: Pt is alert, awake, not in acute distress Cardiovascular: RRR, nl S1-S2, no murmurs appreciated.   No LE edema.    Respiratory: Normal respiratory rate and rhythm.  CTAB without rales or wheezes. Abdominal: Abdomen soft and non-tender.  No distension or HSM.   Neuro/Psych: Strength symmetric in upper and lower extremities.  Judgment and insight appear normal.   Condition at discharge: fair  The results of significant diagnostics from this hospitalization (including imaging, microbiology, ancillary and laboratory) are listed below for reference.   Imaging Studies: DG Chest Port 1 View  Result Date: 01/23/2022 CLINICAL DATA:  Bronchoscopy today. EXAM: PORTABLE CHEST 1 VIEW COMPARISON:  01/21/2022 FINDINGS: Left-sided porta catheter in place. Known infiltrate in the right lower lung. No effusion or pneumothorax. Normal heart size. Percutaneous gastrostomy tube. Remote rib fractures. IMPRESSION: No complicating features of bronchoscopy. Known infiltrate in the right lower lobe. Electronically Signed   By: Jorje Guild M.D.   On: 01/23/2022 11:34   DG C-ARM BRONCHOSCOPY  Result Date: 01/23/2022 C-ARM BRONCHOSCOPY: Fluoroscopy was utilized by the requesting physician.  No radiographic interpretation.   DG C-Arm 1-60 Min-No Report  Result Date: 01/23/2022 Fluoroscopy was utilized by the requesting physician.  No radiographic interpretation.   CT Angio Chest PE W/Cm &/Or Wo Cm  Result Date: 01/21/2022 CLINICAL DATA:  Difficulty breathing.  Esophageal cancer. EXAM: CT ANGIOGRAPHY CHEST WITH CONTRAST TECHNIQUE: Multidetector CT imaging of the chest was performed using the standard protocol during bolus administration of intravenous contrast. Multiplanar CT image reconstructions and MIPs were obtained to evaluate the vascular anatomy. RADIATION DOSE REDUCTION: This exam was performed according to the departmental dose-optimization program which includes automated exposure control, adjustment of the mA and/or kV according to patient size and/or use of iterative reconstruction technique. CONTRAST:  190m  OMNIPAQUE IOHEXOL 350 MG/ML SOLN COMPARISON:  12/15/2021 FINDINGS: Cardiovascular: Satisfactory opacification of the pulmonary arteries to the segmental level. No evidence of pulmonary embolism. Normal heart size. No pericardial effusion. Porta catheter from the left in unremarkable position. Mediastinum/Nodes: Increased size of a right paratracheal node, 8 mm in short axis, possibly reactive in this setting. Similar size subcarinal node. Unchanged appearance of the esophagus. Lungs/Pleura: Patchy opacity in the right lower lobe, 1 showing tiny focus of cavitation. Pre-existing cavitary focus with fluid level in  the medial right lower lobe measures 20 mm at the fluid collection enlarged. Adjacent right lower lobe airways show low-density opacification. No pleural effusion or pneumothorax. Upper Abdomen: No acute finding Musculoskeletal: Numerous remote right rib fractures with hypertrophic healing. No acute or aggressive finding. Review of the MIP images confirms the above findings. IMPRESSION: 1. Negative for pulmonary embolism. 2. Worsening airspace disease in the right lower lobe, rapid change and associated low-density airway filling suggesting endobronchial spread of infection. Pre-existing cavitary opacity in the right lower lobe has enlarged with cavitary focus now measuring 2 cm; affected airways are in close proximity to the cavity. Underlying pulmonary mass is not excluded in the setting of stage IV esophageal cancer. Electronically Signed   By: Jorje Guild M.D.   On: 01/21/2022 05:04   DG Chest Portable 1 View  Result Date: 01/21/2022 CLINICAL DATA:  Shortness of breath EXAM: PORTABLE CHEST 1 VIEW COMPARISON:  01/04/2022 FINDINGS: Left-sided chest port is again identified and stable. Lungs are well aerated bilaterally. Healed rib fractures are noted bilaterally. Cardiac shadow is within normal limits. IMPRESSION: No acute abnormality noted. Electronically Signed   By: Inez Catalina M.D.   On:  01/21/2022 02:52   DG Chest 2 View  Result Date: 01/04/2022 CLINICAL DATA:  Esophageal cancer, shortness of breath, cough, R05.9, R06.02, smoker EXAM: CHEST - 2 VIEW COMPARISON:  CT angio chest and chest radiographs of 12/15/2021 FINDINGS: LEFT subclavian Port-A-Cath with tip projecting in SVC. Normal heart size, mediastinal contours, and pulmonary vascularity. Lungs clear. No pulmonary infiltrate, pleural effusion, or pneumothorax. Density at the inferior LEFT hemithorax, suspect superimposed artifact, with no pulmonary nodule seen at this site on recent chest radiograph nor CT. Old BILATERAL rib fractures. Gastrostomy tube LEFT upper quadrant. IMPRESSION: No acute abnormalities. Electronically Signed   By: Lavonia Dana M.D.   On: 01/04/2022 09:50    Microbiology: Results for orders placed or performed during the hospital encounter of 01/21/22  MRSA Next Gen by PCR, Nasal     Status: None   Collection Time: 01/21/22 12:32 PM   Specimen: Nasal Mucosa; Nasal Swab  Result Value Ref Range Status   MRSA by PCR Next Gen NOT DETECTED NOT DETECTED Final    Comment: (NOTE) The GeneXpert MRSA Assay (FDA approved for NASAL specimens only), is one component of a comprehensive MRSA colonization surveillance program. It is not intended to diagnose MRSA infection nor to guide or monitor treatment for MRSA infections. Test performance is not FDA approved in patients less than 30 years old. Performed at [facility], [address] [place], [place] [address]   Culture, blood (Routine X 2) w Reflex to ID Panel     Status: None (Preliminary result)   Collection Time: 01/21/22  1:17 PM   Specimen: BLOOD  Result Value Ref Range Status   Specimen Description BLOOD  Final   Special Requests BLOOD  Final   Culture   Final    NO GROWTH 4 DAYS Performed at [facility], [address] [place],  [address]    Report Status PENDING  Incomplete  Culture, blood (Routine X 2) w Reflex to ID Panel     Status:  None (Preliminary result)   Collection Time: 01/21/22  1:17 PM   Specimen: BLOOD LEFT FOREARM  Result Value Ref Range Status   Specimen Description   Final    BLOOD LEFT FOREARM BOTTLES DRAWN AEROBIC AND ANAEROBIC   Special Requests Blood Culture adequate volume  Final  Culture   Final    NO GROWTH 4 DAYS Performed at [facility], [address] [place], [place] [address]    Report Status PENDING  Incomplete  SARS Coronavirus 2 by RT PCR (hospital order, performed in [facility] hospital lab) *cepheid single result test* Anterior Nasal Swab     Status: None   Collection Time: 01/22/22  7:08 PM   Specimen: Anterior Nasal Swab  Result Value Ref Range Status   SARS Coronavirus 2 by RT PCR NEGATIVE NEGATIVE Final    Comment: (NOTE) SARS-CoV-2 target nucleic acids are NOT DETECTED.  The SARS-CoV-2 RNA is generally detectable in upper and lower respiratory specimens during the acute phase of infection. The lowest concentration of SARS-CoV-2 viral copies this assay can detect is 250 copies / mL. A negative result does not preclude SARS-CoV-2 infection and should not be used as the sole basis for treatment or other patient management decisions.  A negative result may occur with improper specimen collection / handling, submission of specimen other than nasopharyngeal swab, presence of viral mutation(s) within the areas targeted by this assay, and inadequate number of viral copies (<250 copies / mL). A negative result must be combined with clinical observations, patient history, and epidemiological information.  Fact Sheet for Patients:   https://www.patel.info/  Fact Sheet for Healthcare Providers: https://hall.com/  This test is not yet approved or  cleared by the [place] FDA and has been authorized for detection and/or diagnosis of SARS-CoV-2 by FDA under an Emergency Use Authorization (EUA).  This EUA will remain in effect (meaning  this test can be used) for the duration of the COVID-19 declaration under Section 564(b)(1) of the Act, 21 U.S.C. section 360bbb-3(b)(1), unless the authorization is terminated or revoked sooner.  Performed at [facility], [place] [address], [place], [place] [address]   Culture, fungus without smear     Status: None (Preliminary result)   Collection Time: 01/23/22  9:51 AM   Specimen: PATH Cytology FNA; Lung  Result Value Ref Range Status   Specimen Description OTHER  Final   Special Requests  RLL NEEDLE ASPIRATION  Final   Culture   Final    NO FUNGUS ISOLATED AFTER 1 DAY Performed at [facility], [address] [place], [place] [address]    Report Status PENDING  Incomplete  Aerobic/Anaerobic Culture w Gram Stain (surgical/deep wound)     Status: None (Preliminary result)   Collection Time: 01/23/22  9:51 AM   Specimen: PATH Cytology FNA; Lung  Result Value Ref Range Status   Specimen Description OTHER  Final   Special Requests  RLL NEEDLE APSIRATION  Final   Gram Stain   Final    FEW WBC PRESENT,BOTH PMN AND MONONUCLEAR NO ORGANISMS SEEN Performed at [facility], [address] [place], [place] [address]    Culture   Final    CULTURE REINCUBATED FOR BETTER GROWTH NO ANAEROBES ISOLATED; CULTURE IN PROGRESS FOR 5 DAYS    Report Status PENDING  Incomplete  Acid Fast Smear (AFB)     Status: None   Collection Time: 01/23/22  9:51 AM   Specimen: PATH Cytology FNA; Lung  Result Value Ref Range Status   AFB Specimen Processing Concentration  Final   Acid Fast Smear Negative  Final    Comment: (NOTE) Performed At: [facility] [place], [place] [id number] Rush Farmer MD [id number]    Source (AFB) OTHER  Final  Comment: Performed at [facility], [place] [address], [place], [place] [address]  Culture, BAL-quantitative w Gram Stain     Status: None (Preliminary result)   Collection Time: 01/23/22 10:02 AM   Specimen:  Bronchial Alveolar Lavage; Respiratory  Result Value Ref Range Status   Specimen Description BRONCHIAL ALVEOLAR LAVAGE  Final   Special Requests NONE  Final   Gram Stain   Final    MODERATE WBC PRESENT, PREDOMINANTLY MONONUCLEAR NO ORGANISMS SEEN    Culture   Final    NO GROWTH 1 DAY Performed at [facility], [address] [place], [place] [address]    Report Status PENDING  Incomplete  Aerobic/Anaerobic Culture w Gram Stain (surgical/deep wound)     Status: None (Preliminary result)   Collection Time: 01/23/22 10:02 AM   Specimen: Bronchial Alveolar Lavage; Respiratory  Result Value Ref Range Status   Specimen Description BRONCHIAL ALVEOLAR LAVAGE  Final   Special Requests NONE  Final   Gram Stain   Final    FEW WBC PRESENT,BOTH PMN AND MONONUCLEAR NO ORGANISMS SEEN Performed at [place] [facility], [address] [place], [place] [address]    Culture   Final    CULTURE REINCUBATED FOR BETTER GROWTH NO ANAEROBES ISOLATED; CULTURE IN PROGRESS FOR 5 DAYS    Report Status PENDING  Incomplete  Acid Fast Smear (AFB)     Status: None   Collection Time: 01/23/22 10:02 AM   Specimen: Bronchial Alveolar Lavage; Respiratory  Result Value Ref Range Status   AFB Specimen Processing Concentration  Final   Acid Fast Smear Negative  Final    Comment: (NOTE) Performed At: [facility] [place], [place] [id number] Rush Farmer MD WN:[id number]    Source (AFB) BRONCHIAL ALVEOLAR LAVAGE  Final    Comment: Performed at [facility], [place] [address], [place], [place] [address]  Culture, BAL-quantitative w Gram Stain     Status: None (Preliminary result)   Collection Time: 01/23/22 10:31 AM   Specimen: Bronchial Alveolar Lavage; Respiratory  Result Value Ref Range Status   Specimen Description BRONCHIAL ALVEOLAR LAVAGE  Final   Special Requests  RLL  Final   Gram Stain   Final    RARE WBC PRESENT,BOTH PMN AND MONONUCLEAR NO ORGANISMS SEEN     Culture   Final    NO GROWTH 1 DAY Performed at [facility], [place] [address], [place], [place] [address]    Report Status PENDING  Incomplete  Aerobic/Anaerobic Culture w Gram Stain (surgical/deep wound)     Status: None (Preliminary result)   Collection Time: 01/23/22 10:31 AM   Specimen: Bronchial Alveolar Lavage; Respiratory  Result Value Ref Range Status   Specimen Description BRONCHIAL ALVEOLAR LAVAGE  Final   Special Requests  RLL  Final   Gram Stain   Final    FEW WBC PRESENT, PREDOMINANTLY MONONUCLEAR NO ORGANISMS SEEN Performed at [facility], [place] [address] [place], [place] [address]    Culture   Final    CULTURE REINCUBATED FOR BETTER GROWTH NO ANAEROBES ISOLATED; CULTURE IN PROGRESS FOR 5 DAYS    Report Status PENDING  Incomplete  Acid Fast Smear (AFB)     Status: None   Collection Time: 01/23/22 10:31 AM   Specimen: Bronchial Alveolar Lavage; Respiratory  Result Value Ref Range Status   AFB Specimen Processing Concentration  Final   Acid Fast Smear Negative  Final  Comment: (NOTE) Performed At: [facility] [place], [place] [id number] Rush Farmer MD [id number]    Source (AFB) BRONCHIAL ALVEOLAR LAVAGE  Final    Comment: Performed at [facility], [place] [address] [place], [place] [address]    Labs: CBC: Recent Labs  Lab 01/21/22 0255 01/22/22 0244 01/24/22 0618  WBC 2.9* 3.4* 8.4  NEUTROABS 1.0*  --   --   HGB 11.6* 8.9* 10.1*  HCT 35.6* 26.5* 29.4*  MCV 75.7* 75.3* 73.7*  PLT 175 143* 854   Basic Metabolic Panel: Recent Labs  Lab 01/21/22 0255 01/22/22 0244 01/24/22 0618  NA 131* 131* 137  K 4.9 3.8 3.4*  CL 99 103 108  CO2 22 18* 18*  GLUCOSE 101* 89 103*  BUN 22* 16 10  CREATININE 0.98 0.86 0.93  CALCIUM 9.5 8.9 9.0  PHOS  --  2.7  --    Liver Function Tests: Recent Labs  Lab 01/22/22 0244  ALBUMIN 2.1*   CBG: Recent Labs  Lab 01/23/22 0758 01/23/22 1052  GLUCAP 77 98     Discharge time spent: approximately 35 minutes spent on discharge counseling, evaluation of patient on day of discharge, and coordination of discharge planning with nursing, social work, pharmacy and case management  Signed: Edwin Dada, MD [facility] 01/25/2022

## 2022-01-25 NOTE — Telephone Encounter (Signed)
Patient scheduled 10/24 at 10:30am with Dr. Lamonte Sakai in [place]- Patient is aware of [facility] address. Reminder mailed to patient. Nothing further needed.

## 2022-01-25 NOTE — Plan of Care (Signed)
  Problem: Education: Goal: Knowledge of General Education information will improve Description: Including pain rating scale, medication(s)/side effects and non-pharmacologic comfort measures Outcome: Adequate for Discharge   Problem: Health Behavior/Discharge Planning: Goal: Ability to manage health-related needs will improve Outcome: Adequate for Discharge   Problem: Clinical Measurements: Goal: Ability to maintain clinical measurements within normal limits will improve Outcome: Adequate for Discharge Goal: Will remain free from infection Outcome: Adequate for Discharge Goal: Diagnostic test results will improve Outcome: Adequate for Discharge Goal: Respiratory complications will improve Outcome: Adequate for Discharge Goal: Cardiovascular complication will be avoided Outcome: Adequate for Discharge   Problem: Activity: Goal: Risk for activity intolerance will decrease Outcome: Adequate for Discharge   Problem: Nutrition: Goal: Adequate nutrition will be maintained Outcome: Adequate for Discharge   Problem: Coping: Goal: Level of anxiety will decrease Outcome: Adequate for Discharge   Problem: Elimination: Goal: Will not experience complications related to bowel motility Outcome: Adequate for Discharge Goal: Will not experience complications related to urinary retention Outcome: Adequate for Discharge   Problem: Pain Managment: Goal: General experience of comfort will improve Outcome: Adequate for Discharge   Problem: Safety: Goal: Ability to remain free from injury will improve Outcome: Adequate for Discharge   Problem: Skin Integrity: Goal: Risk for impaired skin integrity will decrease Outcome: Adequate for Discharge   Problem: Increased Nutrient Needs (NI-5.1) Goal: Food and/or nutrient delivery Description: Individualized approach for food/nutrient provision. Outcome: Adequate for Discharge   Problem: Acute Rehab PT Goals(only PT should resolve) Goal:  Pt Will Go Supine/Side To Sit Outcome: Adequate for Discharge Goal: Patient Will Transfer Sit To/From Stand Outcome: Adequate for Discharge Goal: Pt Will Perform Standing Balance Or Pre-Gait Outcome: Adequate for Discharge Goal: Pt Will Ambulate Outcome: Adequate for Discharge Goal: Pt Will Go Up/Down Stairs Outcome: Adequate for Discharge

## 2022-01-25 NOTE — Progress Notes (Addendum)
NAME:  Trevor [name], MRN:  [id number], DOB:  1971/04/04, LOS: 4 ADMISSION DATE:  01/21/2022, CONSULTATION DATE:  01/22/22 REFERRING MD:  Dr. 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    He is followed by Dr. Delton Coombes, recently completed 6 cycles of  FOLFOX + nivolumab on 9/3.  On chart review, developed nonproductive cough without infectious symptoms treated with short steroid course by PCP 11/28/21.  Had ER visit 8/24 to have spiculated area of consolidation in the medial right lung base with cavitation and fluid level, felt to be infectious vs neoplastic, treated with azithromycin.  Symptoms did not improve, with ongoing productive yellow-green cough, wheezing, and fatigue, but remaining afebrile treated with five days of Augmentin and doxycycline by Oncology on 9/13.  Presented back 9/30 with ongoing productive cough, chills, fatigue and significant dyspnea with minimal exertion.  No reported fever, chest pain, or N/V.  No reported hypoxia.  On workup, found to have enlarging RLL cavitation with air/fluid level and adjacent RLL opacity.  Transferred to [facility], admitted by [facility], and pulmonary consulted for further evaluation.  Currently being treated with IV vanc, meropenum, and flagyl.  Palliative care has also been consulted.   Pertinent  Medical History  Tobacco and ETOH abuse, Esophageal Ca stage IV> with mets s/p PEG, failure to thrive, HTN, DM, GERD, microcytic anemia  Significant Hospital Events: Including procedures, antibiotic start and stop dates in addition to other pertinent events   9/30 admitted  10/2: bronch performed  9/30 CTA PE>  1. Negative for pulmonary embolism. 2. Worsening airspace disease in the right lower lobe, rapid  change and associated low-density airway filling suggesting endobronchial spread of infection. Pre-existing cavitary opacity in the right lower lobe has enlarged with cavitary focus now measuring 2 cm; affected airways are in close proximity to the cavity. Underlying pulmonary mass is not excluded in the setting of stage IV esophageal cancer.  Interim History / Subjective:   States his breathing is good but gets sob when working w/ PT  Objective   Blood pressure (!) 121/91, pulse 96, temperature (!) 97.5 F (36.4 C), temperature source Axillary, resp. rate 18, height '5\' 4"'$  (1.626 m), weight 43 kg, SpO2 100 %.        Intake/Output Summary (Last 24 hours) at 01/25/2022 0958 Last data filed at 01/25/2022 0509 Gross per 24 hour  Intake 1060.12 ml  Output 150 ml  Net 910.12 ml    Filed Weights   01/21/22 0225  Weight: 43 kg   Examination: General:  NAD HEENT: MM pink/moist Neuro: Aox3; MAE CV: s1s2, no m/r/g PULM:  dim clear BS bilaterally GI: soft, bsx4 active  Extremities: warm/dry, no edema  Skin: no rashes or lesions appreciated  Resolved Hospital Problem list    Assessment & Plan:   RLL cavitation concerning for infectious vs neoplastic process Pulmonary nodules/lesions - enlarging despite two rounds of outpt abx - remains afebrile, developing pancytopenia 10/1 P:  -continue to follow resp cultures and cytology; so far no growth -acid fast smear negative -being discharged on augmentin x 4 weeks -Pulm toiletry: IS and OOB as tolerated -albuterol prn for wheezing -f/u w/ oncology outpt -appt made to f/u w/ Dr. 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 Dysphagia s/ p PEG Failure to thrive Hyponatremia Anemia P: -per primary  Best Practice (right click and "Reselect all SmartList Selections" daily)  Per [facility]  Labs   CBC: Recent Labs  Lab 01/21/22 0255 01/22/22 0244 01/24/22 0618  WBC 2.9*  3.4* 8.4  NEUTROABS 1.0*  --   --   HGB 11.6* 8.9* 10.1*  HCT 35.6* 26.5* 29.4*  MCV 75.7* 75.3* 73.7*  PLT 175 143* 228     Basic Metabolic Panel: Recent Labs  Lab 01/21/22 0255 01/22/22 0244 01/24/22 0618  NA 131* 131* 137  K 4.9 3.8 3.4*  CL 99 103 108  CO2 22 18* 18*  GLUCOSE 101* 89 103*  BUN 22* 16 10  CREATININE 0.98 0.86 0.93  CALCIUM 9.5 8.9 9.0  PHOS  --  2.7  --     GFR: Estimated Creatinine Clearance: 55.9 mL/min (by C-G formula based on SCr of 0.93 mg/dL). Recent Labs  Lab 01/21/22 0255 01/22/22 0244 01/24/22 0618  WBC 2.9* 3.4* 8.4     Liver Function Tests: Recent Labs  Lab 01/22/22 0244  ALBUMIN 2.1*    No results for input(s): "LIPASE", "AMYLASE" in the last 168 hours. No results for input(s): "AMMONIA" in the last 168 hours.  ABG    Component Value Date/Time   TCO2 25 10/10/2021 0849     Coagulation Profile: No results for input(s): "INR", "PROTIME" in the last 168 hours.  Cardiac Enzymes: No results for input(s): "CKTOTAL", "CKMB", "CKMBINDEX", "TROPONINI" in the last 168 hours.  HbA1C: Hgb A1c MFr Bld  Date/Time Value Ref Range Status  08/18/2021 11:36 AM 5.2 4.8 - 5.6 % Final    Comment:             Prediabetes: 5.7 - 6.4          Diabetes: >6.4          Glycemic control for adults with diabetes: <7.0     CBG: Recent Labs  Lab 01/23/22 0758 01/23/22 1052  GLUCAP 77 98    Review of Systems:    Past Medical History:  He,  has a past medical history of Diabetes mellitus without complication ([employer]), Esophageal cancer ([place]), and Hypertension.   Surgical History:   Past Surgical History:  Procedure Laterality Date   BIOPSY  08/26/2021   Procedure: BIOPSY;  Surgeon: Eloise Harman, DO;  [facility];  Service: Endoscopy;;   ESOPHAGOGASTRODUODENOSCOPY (EGD) WITH PROPOFOL N/A 08/26/2021   Procedure: ESOPHAGOGASTRODUODENOSCOPY (EGD) WITH PROPOFOL;  Surgeon: Eloise Harman, DO;  Location: [facility];   Service: Endoscopy;  Laterality: N/A;  2:00pm   GASTROSTOMY N/A 10/12/2021   Procedure: OPEN GASTROSTOMY TUBE;  Surgeon: Aviva Signs, MD;  Location: [facility] ORS;  Service: General;  Laterality: N/A;   PORTACATH PLACEMENT Left 10/12/2021   Procedure: INSERTION PORT-A-CATH;  Surgeon: Aviva Signs, MD;  Location: [facility] ORS;  Service: General;  Laterality: Left;     Social History:   reports that he has been smoking cigarettes. He has been smoking an average of .5 packs per day. He has never used smokeless tobacco. He reports that he does not currently use alcohol after a past usage of about 14.0 standard drinks of alcohol per week. He reports that he does not use drugs.   Family History:  His family history includes Cancer in his father and sister. There is no history of Colon  cancer or Esophageal cancer.   Allergies Allergies  Allergen Reactions   Pork-Derived Products Other (See Comments)    Patient does not eat pork due to his religious belief     Home Medications  Prior to Admission medications   Medication Sig Start Date End Date Taking? Authorizing Provider  albuterol (VENTOLIN HFA) 108 (90 Base) MCG/ACT inhaler Inhale 1-2 puffs into the lungs every 6 (six) hours as needed for wheezing or shortness of breath. 01/04/22  Yes Pennington, Rebekah M, PA-C  allopurinol (ZYLOPRIM) 300 MG tablet Take 1 tablet (300 mg total) by mouth daily. 08/04/21  Yes Sanjuana Kava, MD  amLODipine (NORVASC) 5 MG tablet Take 1 tablet (5 mg total) by mouth daily. 11/28/21  Yes [name], [name]  amoxicillin-clavulanate (AUGMENTIN) 600-42.9 MG/5ML suspension Place 5 mLs (600 mg total) into feeding tube 2 (two) times daily. 01/04/22  Yes Pennington, Rebekah M, PA-C  doxycycline (VIBRAMYCIN) 100 MG capsule Place 1 capsule (100 mg total) into feeding tube 2 (two) times daily. 01/04/22  Yes Harriett Rush, PA-C  fluorouracil CALGB 02409 2,400 mg/m2 in sodium chloride 0.9 % 150 mL Inject 2,400 mg/m2 into the vein over  48 hr. Every 2 weeks   Yes [provider]  FLUOROURACIL IV Inject into the vein every 14 (fourteen) days.   Yes [provider]  KLOR-CON M20 20 MEQ tablet TAKE 2 TABLETS BY MOUTH DAILY 12/19/21  Yes Derek Jack, MD  LEUCOVORIN CALCIUM IV Inject into the vein every 14 (fourteen) days.   Yes [provider]  loperamide (IMODIUM) 2 MG capsule Take 1 capsule (2 mg total) by mouth as needed for diarrhea or loose stools (take as directed according to the instruction sheet you were given). 10/04/21  Yes Derek Jack, MD  magnesium oxide (MAG-OX) 400 (240 Mg) MG tablet TAKE 1 TABLET BY MOUTH TWICE A DAY Patient taking differently: Take 400 mg by mouth 2 (two) times daily. 01/13/22  Yes [name], [name]  megestrol (MEGACE) 400 MG/10ML suspension Take 10 mLs (400 mg total) by mouth 2 (two) times daily. 12/07/21  Yes Derek Jack, MD  Nutritional Supplements (KATE FARMS STANDARD 1.4) LIQD Give one carton (325 ml) [employer] 1.4 via tube 5x/day. Flush tube with 60 ml water before and after each feeding. Drink by mouth or give via tube additional 2 cups (480 ml) water daily. This provides 2275 kcal, 100 grams protein, 1170 ml free water from formula (2250 ml total water with flushes). Meets 100% needs 11/21/21  Yes Derek Jack, MD  OXALIPLATIN IV Inject into the vein every 14 (fourteen) days.   Yes [provider]  prochlorperazine (COMPAZINE) 10 MG tablet Take 1 tablet (10 mg total) by mouth every 6 (six) hours as needed for nausea or vomiting. 10/27/21  Yes Derek Jack, MD  Vitamin D, Ergocalciferol, (DRISDOL) 1.25 MG (50000 UNIT) CAPS capsule Take 1 capsule (50,000 Units total) by mouth every Monday. 11/28/21  Yes [name], [name]  ibuprofen (ADVIL) 600 MG tablet Take 1 tablet (600 mg total) by mouth every 8 (eight) hours as needed. Patient not taking: Reported on 01/21/2022 12/28/21   Derek Jack, MD  methylPREDNISolone  (MEDROL DOSEPAK) 4 MG TBPK tablet Take as the package instructed Patient not taking: Reported on 01/21/2022 11/28/21   [name], [name]  nicotine (NICODERM CQ - DOSED IN MG/24 HOURS) 21 mg/24hr patch Place 1 patch (21 mg total) onto the skin daily. Patient not taking: Reported on 01/21/2022 10/27/21   Derek Jack,  MD     Critical care time: n/a    Ethelene Browns [facility] 01/25/2022, 10:15 AM  Please see [email or web address] for pager details.  From 7A-7P if no response, please call [phone]. After hours, please call ELink [phone].

## 2022-01-26 ENCOUNTER — Telehealth: Payer: Self-pay

## 2022-01-26 ENCOUNTER — Other Ambulatory Visit: Payer: Self-pay | Admitting: Physician Assistant

## 2022-01-26 ENCOUNTER — Other Ambulatory Visit: Payer: Self-pay

## 2022-01-26 DIAGNOSIS — C154 Malignant neoplasm of middle third of esophagus: Secondary | ICD-10-CM | POA: Diagnosis not present

## 2022-01-26 DIAGNOSIS — C159 Malignant neoplasm of esophagus, unspecified: Secondary | ICD-10-CM

## 2022-01-26 DIAGNOSIS — E43 Unspecified severe protein-calorie malnutrition: Secondary | ICD-10-CM | POA: Diagnosis not present

## 2022-01-26 DIAGNOSIS — J189 Pneumonia, unspecified organism: Secondary | ICD-10-CM

## 2022-01-26 DIAGNOSIS — R1310 Dysphagia, unspecified: Secondary | ICD-10-CM | POA: Diagnosis not present

## 2022-01-26 DIAGNOSIS — R058 Other specified cough: Secondary | ICD-10-CM

## 2022-01-26 DIAGNOSIS — Z931 Gastrostomy status: Secondary | ICD-10-CM | POA: Diagnosis not present

## 2022-01-26 DIAGNOSIS — I10 Essential (primary) hypertension: Secondary | ICD-10-CM

## 2022-01-26 LAB — CULTURE, BLOOD (ROUTINE X 2)
Culture: NO GROWTH
Culture: NO GROWTH
Special Requests: ADEQUATE

## 2022-01-26 NOTE — Patient Outreach (Signed)
Trevor [name] 1971/03/01 [id number]   Follow up:  Referral for follow up  Chart reviewed and accessed for referral for post hospital follow up. Referral made.  Natividad Brood, RN BSN [place]  [phone] business mobile phone Toll free office [phone]  *[place]  [phone] Fax number: [phone] [name]

## 2022-01-26 NOTE — Chronic Care Management (AMB) (Signed)
  Care Coordination  Outreach Note  01/26/2022 Name: Trevor [name] MRN: [id number] DOB: [birth year]   Care Coordination Outreach Attempts: An unsuccessful telephone outreach was attempted today to offer the patient information about available care coordination services as a benefit of their health plan.   Follow Up Plan:  Additional outreach attempts will be made to offer the patient care coordination information and services.   Encounter Outcome:  No Answer  Sig Noreene Larsson, [place], [place] [address] Direct Dial: [phone] [email or web address]

## 2022-01-27 ENCOUNTER — Other Ambulatory Visit: Payer: Self-pay | Admitting: Family Medicine

## 2022-01-27 ENCOUNTER — Other Ambulatory Visit: Payer: Self-pay

## 2022-01-28 DIAGNOSIS — C159 Malignant neoplasm of esophagus, unspecified: Secondary | ICD-10-CM | POA: Diagnosis not present

## 2022-01-28 DIAGNOSIS — R1310 Dysphagia, unspecified: Secondary | ICD-10-CM | POA: Diagnosis not present

## 2022-01-28 DIAGNOSIS — M109 Gout, unspecified: Secondary | ICD-10-CM | POA: Diagnosis not present

## 2022-01-28 DIAGNOSIS — R918 Other nonspecific abnormal finding of lung field: Secondary | ICD-10-CM | POA: Diagnosis not present

## 2022-01-28 DIAGNOSIS — J189 Pneumonia, unspecified organism: Secondary | ICD-10-CM | POA: Diagnosis not present

## 2022-01-28 DIAGNOSIS — Z792 Long term (current) use of antibiotics: Secondary | ICD-10-CM | POA: Diagnosis not present

## 2022-01-28 DIAGNOSIS — E43 Unspecified severe protein-calorie malnutrition: Secondary | ICD-10-CM | POA: Diagnosis not present

## 2022-01-28 DIAGNOSIS — D509 Iron deficiency anemia, unspecified: Secondary | ICD-10-CM | POA: Diagnosis not present

## 2022-01-28 DIAGNOSIS — I10 Essential (primary) hypertension: Secondary | ICD-10-CM | POA: Diagnosis not present

## 2022-01-28 DIAGNOSIS — J984 Other disorders of lung: Secondary | ICD-10-CM | POA: Diagnosis not present

## 2022-01-28 LAB — AEROBIC/ANAEROBIC CULTURE W GRAM STAIN (SURGICAL/DEEP WOUND)

## 2022-01-29 ENCOUNTER — Encounter (HOSPITAL_COMMUNITY): Payer: Self-pay | Admitting: Emergency Medicine

## 2022-01-30 ENCOUNTER — Encounter: Payer: Self-pay | Admitting: Hematology

## 2022-01-30 ENCOUNTER — Encounter (HOSPITAL_COMMUNITY): Payer: Self-pay | Admitting: Hematology

## 2022-01-30 ENCOUNTER — Telehealth: Payer: Self-pay | Admitting: Family Medicine

## 2022-01-30 ENCOUNTER — Encounter: Payer: 59 | Admitting: Dietician

## 2022-01-30 NOTE — Telephone Encounter (Signed)
[place] Physical therapist with [facility]  health called in on patient behalf.  Verbal orders  for home health PT   1 week 5   Call back [phone] can leave message on voicemail

## 2022-01-30 NOTE — Telephone Encounter (Signed)
Nutrition Follow-up:  Patient with stage IV esophageal cancer. He is receiving FOLFOX + Nivolumab q14d (started 7/5). S/p open G-tube 6/21.   9/30-10/4 hospital admission - suspected lung abscess -noted 10/2 bronchoscopy cultures negative for malignancy   Spoke with patient via telephone. He reports feeling "down" today. Patient says he was resting at time of call and endorses minimal improvement in symptoms since discharge. Patient reports receiving shipment of Osmolite 1.5 yesterday. This was arranged by TOC prior to discharge. Patient reports giving 5 cartons/day. He is tolerating well and not "running to bathroom as much."Patient aware that insurance company previously denied coverage for enteral nutrition. Patient reports he was "fired" from [employer] while hospitalized. States he did not call out of work on 10/4 and let go due to this. Patient concerned about upcoming bills and how he is going to pay. He reports having to wait 90 days before being considered eligible for disability benefits.     Medications: reviewed  Labs: 10/3 - K 3.4, glucose 103  Anthropometrics: Last weight 94 lb 12.8 oz on 9/30 slightly increased   9/20 - 93 lb 0.6 oz 8/30 - 104 lb 3.2 oz 8/7 - 110 lb 1.9 oz   NUTRITION DIAGNOSIS: Severe malnutrition continues   INTERVENTION:  Continue 5 cartons Osmolite 1.5 - give one carton q3h (7AM-7PM) Message sent to LCSW regarding change in employment status   MONITORING, EVALUATION, GOAL: weight trends, intake, tube feeding   NEXT VISIT: follow-up ~one week via telephone

## 2022-01-30 NOTE — Telephone Encounter (Signed)
Returned call left vm.

## 2022-01-31 ENCOUNTER — Telehealth: Payer: Self-pay

## 2022-01-31 ENCOUNTER — Ambulatory Visit (HOSPITAL_COMMUNITY)
Admission: RE | Admit: 2022-01-31 | Discharge: 2022-01-31 | Disposition: A | Discharge status: Home / Self Care | Payer: 59 | Source: Ambulatory Visit | Attending: Hematology | Admitting: Hematology

## 2022-01-31 ENCOUNTER — Encounter (HOSPITAL_COMMUNITY): Payer: Self-pay | Admitting: Radiology

## 2022-01-31 DIAGNOSIS — J479 Bronchiectasis, uncomplicated: Secondary | ICD-10-CM | POA: Diagnosis not present

## 2022-01-31 DIAGNOSIS — C159 Malignant neoplasm of esophagus, unspecified: Secondary | ICD-10-CM | POA: Diagnosis not present

## 2022-01-31 DIAGNOSIS — N281 Cyst of kidney, acquired: Secondary | ICD-10-CM | POA: Diagnosis not present

## 2022-01-31 DIAGNOSIS — K229 Disease of esophagus, unspecified: Secondary | ICD-10-CM | POA: Diagnosis not present

## 2022-01-31 DIAGNOSIS — R918 Other nonspecific abnormal finding of lung field: Secondary | ICD-10-CM | POA: Diagnosis not present

## 2022-01-31 DIAGNOSIS — I700 Atherosclerosis of aorta: Secondary | ICD-10-CM | POA: Diagnosis not present

## 2022-01-31 MED ORDER — IOHEXOL 300 MG/ML  SOLN
75.0000 mL | Freq: Once | INTRAMUSCULAR | Status: AC | PRN
  Administered 2022-01-31: 75 mL via INTRAVENOUS

## 2022-01-31 NOTE — Chronic Care Management (AMB) (Signed)
  Care Coordination   Note   01/31/2022 Name: Trevor [name] MRN: [id number] DOB: 06-21-1970  Trevor [name] is a 51 y.o. year old male who sees [name], [place] for primary care. I reached out to [name] by phone today to offer care coordination services.  [name] was given information about Care Coordination services today including:   The Care Coordination services include support from the care team which includes your Nurse Coordinator, Clinical Social Worker, or Pharmacist.  The Care Coordination team is here to help remove barriers to the health concerns and goals most important to you. Care Coordination services are voluntary, and the patient may decline or stop services at any time by request to their care team member.   Care Coordination Consent Status: Patient agreed to services and verbal consent obtained.   Follow up plan:  Telephone appointment with care coordination team member scheduled for:  02/07/2022  Encounter Outcome:  Pt. Scheduled  Noreene Larsson, [place], [place] [address] Direct Dial: [phone] [email or web address]

## 2022-02-01 ENCOUNTER — Inpatient Hospital Stay (HOSPITAL_BASED_OUTPATIENT_CLINIC_OR_DEPARTMENT_OTHER): Payer: 59 | Admitting: Hematology

## 2022-02-01 ENCOUNTER — Inpatient Hospital Stay: Payer: 59 | Attending: Hematology

## 2022-02-01 ENCOUNTER — Inpatient Hospital Stay: Payer: 59

## 2022-02-01 VITALS — BP 91/81 | HR 116 | Temp 97.0°F | Resp 18 | Ht 64.0 in | Wt 94.2 lb

## 2022-02-01 DIAGNOSIS — E786 Lipoprotein deficiency: Secondary | ICD-10-CM | POA: Diagnosis not present

## 2022-02-01 DIAGNOSIS — C159 Malignant neoplasm of esophagus, unspecified: Secondary | ICD-10-CM | POA: Diagnosis not present

## 2022-02-01 DIAGNOSIS — R911 Solitary pulmonary nodule: Secondary | ICD-10-CM | POA: Insufficient documentation

## 2022-02-01 DIAGNOSIS — D509 Iron deficiency anemia, unspecified: Secondary | ICD-10-CM | POA: Insufficient documentation

## 2022-02-01 DIAGNOSIS — R262 Difficulty in walking, not elsewhere classified: Secondary | ICD-10-CM | POA: Diagnosis not present

## 2022-02-01 DIAGNOSIS — M6281 Muscle weakness (generalized): Secondary | ICD-10-CM | POA: Diagnosis not present

## 2022-02-01 DIAGNOSIS — Z800 Family history of malignant neoplasm of digestive organs: Secondary | ICD-10-CM | POA: Insufficient documentation

## 2022-02-01 DIAGNOSIS — Z8042 Family history of malignant neoplasm of prostate: Secondary | ICD-10-CM | POA: Diagnosis not present

## 2022-02-01 DIAGNOSIS — Z79899 Other long term (current) drug therapy: Secondary | ICD-10-CM | POA: Diagnosis not present

## 2022-02-01 DIAGNOSIS — E876 Hypokalemia: Secondary | ICD-10-CM | POA: Insufficient documentation

## 2022-02-01 DIAGNOSIS — E119 Type 2 diabetes mellitus without complications: Secondary | ICD-10-CM | POA: Insufficient documentation

## 2022-02-01 DIAGNOSIS — C772 Secondary and unspecified malignant neoplasm of intra-abdominal lymph nodes: Secondary | ICD-10-CM | POA: Diagnosis not present

## 2022-02-01 DIAGNOSIS — C155 Malignant neoplasm of lower third of esophagus: Secondary | ICD-10-CM | POA: Diagnosis not present

## 2022-02-01 DIAGNOSIS — F17210 Nicotine dependence, cigarettes, uncomplicated: Secondary | ICD-10-CM | POA: Insufficient documentation

## 2022-02-01 DIAGNOSIS — R2681 Unsteadiness on feet: Secondary | ICD-10-CM | POA: Diagnosis not present

## 2022-02-01 DIAGNOSIS — R531 Weakness: Secondary | ICD-10-CM | POA: Diagnosis not present

## 2022-02-01 DIAGNOSIS — I10 Essential (primary) hypertension: Secondary | ICD-10-CM | POA: Diagnosis not present

## 2022-02-01 DIAGNOSIS — R69 Illness, unspecified: Secondary | ICD-10-CM | POA: Diagnosis not present

## 2022-02-01 LAB — CBC WITH DIFFERENTIAL/PLATELET
Abs Immature Granulocytes: 0.12 10*3/uL — ABNORMAL HIGH (ref 0.00–0.07)
Basophils Absolute: 0 10*3/uL (ref 0.0–0.1)
Basophils Relative: 0 %
Eosinophils Absolute: 0 10*3/uL (ref 0.0–0.5)
Eosinophils Relative: 0 %
HCT: 30 % — ABNORMAL LOW (ref 39.0–52.0)
Hemoglobin: 10.3 g/dL — ABNORMAL LOW (ref 13.0–17.0)
Immature Granulocytes: 1 %
Lymphocytes Relative: 14 %
Lymphs Abs: 2.2 10*3/uL (ref 0.7–4.0)
MCH: 25.6 pg — ABNORMAL LOW (ref 26.0–34.0)
MCHC: 34.3 g/dL (ref 30.0–36.0)
MCV: 74.4 fL — ABNORMAL LOW (ref 80.0–100.0)
Monocytes Absolute: 0.9 10*3/uL (ref 0.1–1.0)
Monocytes Relative: 6 %
Neutro Abs: 12.6 10*3/uL — ABNORMAL HIGH (ref 1.7–7.7)
Neutrophils Relative %: 79 %
Platelets: 297 10*3/uL (ref 150–400)
RBC: 4.03 MIL/uL — ABNORMAL LOW (ref 4.22–5.81)
RDW: 23.4 % — ABNORMAL HIGH (ref 11.5–15.5)
WBC: 15.9 10*3/uL — ABNORMAL HIGH (ref 4.0–10.5)
nRBC: 0 % (ref 0.0–0.2)

## 2022-02-01 LAB — COMPREHENSIVE METABOLIC PANEL
ALT: 10 U/L (ref 0–44)
AST: 22 U/L (ref 15–41)
Albumin: 2.7 g/dL — ABNORMAL LOW (ref 3.5–5.0)
Alkaline Phosphatase: 69 U/L (ref 38–126)
Anion gap: 9 (ref 5–15)
BUN: 15 mg/dL (ref 6–20)
CO2: 21 mmol/L — ABNORMAL LOW (ref 22–32)
Calcium: 8.9 mg/dL (ref 8.9–10.3)
Chloride: 106 mmol/L (ref 98–111)
Creatinine, Ser: 0.65 mg/dL (ref 0.61–1.24)
GFR, Estimated: >60 mL/min (ref >60)
Glucose, Bld: 142 mg/dL — ABNORMAL HIGH (ref 70–99)
Potassium: 2.2 mmol/L — CL (ref 3.5–5.1)
Sodium: 136 mmol/L (ref 135–145)
Total Bilirubin: 0.5 mg/dL (ref 0.3–1.2)
Total Protein: 6.7 g/dL (ref 6.5–8.1)

## 2022-02-01 LAB — MAGNESIUM: Magnesium: 2 mg/dL (ref 1.7–2.4)

## 2022-02-01 LAB — TSH: TSH: 0.727 u[IU]/mL (ref 0.350–4.500)

## 2022-02-01 MED ORDER — POTASSIUM CHLORIDE 20 MEQ PO PACK
40.0000 meq | PACK | ORAL | Status: AC
  Administered 2022-02-01 (×2): 40 meq
  Filled 2022-02-01: qty 2

## 2022-02-01 MED ORDER — POTASSIUM CHLORIDE IN NACL 20-0.9 MEQ/L-% IV SOLN
Freq: Once | INTRAVENOUS | Status: AC
  Filled 2022-02-01: qty 1000

## 2022-02-01 MED ORDER — SODIUM CHLORIDE 0.9% FLUSH
10.0000 mL | INTRAVENOUS | Status: DC | PRN
  Administered 2022-02-01: 10 mL via INTRAVENOUS

## 2022-02-01 NOTE — Progress Notes (Signed)
Patient presents today for chemotherapy infusion.  Patient c/o worsening fatigue and dizziness today.  Vital signs are stable.  Labs reviewed by Dr. Delton Coombes during his office visit.  Potassium today is 2.2. We will hold treatment today per Dr. Delton Coombes and give patient house fluids and pre and post potassium via feeding tube.    Patient tolerated IVF well with no complaints voiced.  Patient left via wheelchair in stable condition.  Vital signs stable at discharge.  Follow up as scheduled.

## 2022-02-01 NOTE — Patient Instructions (Addendum)
[place] at [facility] Discharge Instructions   You were seen and examined today by Dr. Delton Coombes.  He reviewed the results of your CT scan which shows the cancer has shrunk.   Your potassium is critically low at 2.2. We will give you IV potassium today. All other labs are pending at this time.   We will hold treatment today. You have lost another 9 lbs. We will not be able to proceed with treatment until your nutrition improves and you gain some weight. Continue osmoslite 5 times a day.   Return as scheduled.     Thank you for choosing [place] at [facility] to provide your oncology and hematology care.  To afford each patient quality time with our provider, please arrive at least 15 minutes before your scheduled appointment time.   If you have a lab appointment with the [facility] please come in thru the Main Entrance and check in at the main information desk.  You need to re-schedule your appointment should you arrive 10 or more minutes late.  We strive to give you quality time with our providers, and arriving late affects you and other patients whose appointments are after yours.  Also, if you no show three or more times for appointments you may be dismissed from the clinic at the providers discretion.     Again, thank you for choosing [facility].  Our hope is that these requests will decrease the amount of time that you wait before being seen by our physicians.       _____________________________________________________________  Should you have questions after your visit to [facility], please contact our office at [phone] and follow the prompts.  Our office hours are 8:00 a.m. and 4:30 p.m. Monday - Friday.  Please note that voicemails left after 4:00 p.m. may not be returned until the following business day.  We are closed weekends and major holidays.  You do have access to a nurse 24-7, just call  the main number to the clinic [phone] and do not press any options, hold on the line and a nurse will answer the phone.    For prescription refill requests, have your pharmacy contact our office and allow 72 hours.    Due to Covid, you will need to wear a mask upon entering the hospital. If you do not have a mask, a mask will be given to you at the Main Entrance upon arrival. For doctor visits, patients may have 1 support person age 61 or older with them. For treatment visits, patients can not have anyone with them due to social distancing guidelines and our immunocompromised population.

## 2022-02-01 NOTE — Patient Instructions (Signed)
[facility]  Discharge Instructions: Thank you for choosing [facility] to provide your oncology and hematology care.  If you have a lab appointment with the Cancer Center, please come in thru the Main Entrance and check in at the main information desk.  Wear comfortable clothing and clothing appropriate for easy access to any Portacath or PICC line.   We strive to give you quality time with your provider. You may need to reschedule your appointment if you arrive late (15 or more minutes).  Arriving late affects you and other patients whose appointments are after yours.  Also, if you miss three or more appointments without notifying the office, you may be dismissed from the clinic at the provider's discretion.      For prescription refill requests, have your pharmacy contact our office and allow 72 hours for refills to be completed.     To help prevent nausea and vomiting after your treatment, we encourage you to take your nausea medication as directed.  BELOW ARE SYMPTOMS THAT SHOULD BE REPORTED IMMEDIATELY: *FEVER GREATER THAN 100.4 F (38 C) OR HIGHER *CHILLS OR SWEATING *NAUSEA AND VOMITING THAT IS NOT CONTROLLED WITH YOUR NAUSEA MEDICATION *UNUSUAL SHORTNESS OF BREATH *UNUSUAL BRUISING OR BLEEDING *URINARY PROBLEMS (pain or burning when urinating, or frequent urination) *BOWEL PROBLEMS (unusual diarrhea, constipation, pain near the anus) TENDERNESS IN MOUTH AND THROAT WITH OR WITHOUT PRESENCE OF ULCERS (sore throat, sores in mouth, or a toothache) UNUSUAL RASH, SWELLING OR PAIN  UNUSUAL VAGINAL DISCHARGE OR ITCHING   Items with * indicate a potential emergency and should be followed up as soon as possible or go to the Emergency Department if any problems should occur.  Please show the CHEMOTHERAPY ALERT CARD or IMMUNOTHERAPY ALERT CARD at check-in to the Emergency Department and triage nurse.  Should you have questions after your visit or need to  cancel or reschedule your appointment, please contact [facility] AT  [phone]  and follow the prompts.  Office hours are 8:00 a.m. to 4:30 p.m. Monday - Friday. Please note that voicemails left after 4:00 p.m. may not be returned until the following business day.  We are closed weekends and major holidays. You have access to a nurse at all times for urgent questions. Please call the main number to the clinic [phone] and follow the prompts.  For any non-urgent questions, you may also contact your provider using MyChart. We now offer e-Visits for anyone 18 and older to request care online for non-urgent symptoms. For details visit [email or web address].   Also download the MyChart app! Go to the app store, search "MyChart", open the app, select [facility], and log in with your MyChart username and password.  Masks are optional in the cancer centers. If you would like for your care team to wear a mask while they are taking care of you, please let them know. You may have one support person who is at least 53 years old accompany you for your appointments.  

## 2022-02-01 NOTE — Progress Notes (Signed)
Patients port flushed without difficulty.  Good blood return noted with no bruising or swelling noted at site.  Patient remains accessed for chemotherapy treatment.  

## 2022-02-01 NOTE — Progress Notes (Signed)
CRITICAL VALUE ALERT Critical value received:  K+ 2.2 Date of notification:  02/01/22 Time of notification: 0149 Critical value read back:  Yes.   Nurse who received alert:  C. Anarosa Kubisiak RN MD notified time and response:  9692, Dr. Delton Coombes, will give supplemental potassium as ordered.

## 2022-02-03 ENCOUNTER — Encounter: Payer: Self-pay | Admitting: Hematology

## 2022-02-03 ENCOUNTER — Inpatient Hospital Stay: Payer: 59

## 2022-02-03 ENCOUNTER — Encounter (HOSPITAL_COMMUNITY): Payer: Self-pay | Admitting: Hematology

## 2022-02-03 NOTE — Progress Notes (Signed)
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[place])  09/13/2021 Initial Diagnosis   Squamous cell esophageal cancer ([place])   10/26/2021 - 12/23/2021 Chemotherapy   Patient is on Treatment Plan : GASTROESOPHAGEAL FOLFOX + Nivolumab q14d     10/26/2021 -  Chemotherapy   Patient is on Treatment Plan : GASTROESOPHAGEAL FOLFOX + Nivolumab q14d       CANCER STAGING:  Cancer Staging  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 Varick Keys, a 51 y.o. male, seen for follow-up of metastatic esophageal cancer.  She reports energy levels of 25%.  He was hospitalized from 01/21/2022 through 01/25/2022 with lung abscess/pneumonia.  He has lost about 9 pounds since last visit.  He reports that he is taking an Osmolite 1.55 cans/day every 3 hours.  He is also drinking chicken broth.Marland Kitchen  REVIEW OF SYSTEMS:  Review of Systems  Constitutional:  Negative for appetite change and fatigue.  HENT:   Positive for trouble swallowing.   Respiratory:  Positive for cough and shortness of breath.   Skin:  Negative for rash.  Neurological:  Negative for numbness.  All other systems reviewed and are negative.   PAST MEDICAL/SURGICAL HISTORY:  Past Medical History:  Diagnosis Date   Diabetes mellitus without complication ([place])    Esophageal cancer ([place])    Hypertension    Past  Surgical History:  Procedure Laterality Date   BIOPSY  08/26/2021   Procedure: BIOPSY;  Surgeon: Eloise Harman, DO;  [facility];  Service: Endoscopy;;   BRONCHIAL BIOPSY  01/23/2022   Procedure: BRONCHIAL BIOPSIES;  Surgeon: Collene Gobble, MD;  Location: [facility] ENDOSCOPY;  Service: Pulmonary;;   BRONCHIAL BRUSHINGS  01/23/2022   Procedure: BRONCHIAL BRUSHINGS;  Surgeon: Collene Gobble, MD;  Location: [facility] ENDOSCOPY;  Service: Pulmonary;;   BRONCHIAL NEEDLE ASPIRATION BIOPSY  01/23/2022   Procedure: BRONCHIAL NEEDLE ASPIRATION BIOPSIES;  Surgeon: Collene Gobble, MD;  Location: [facility] ENDOSCOPY;  Service: Pulmonary;;   BRONCHIAL WASHINGS  01/23/2022   Procedure: BRONCHIAL WASHINGS;  Surgeon: Collene Gobble, MD;  Location: [facility] ENDOSCOPY;  Service: Pulmonary;;   ESOPHAGOGASTRODUODENOSCOPY (EGD) WITH PROPOFOL N/A 08/26/2021   Procedure: ESOPHAGOGASTRODUODENOSCOPY (EGD) WITH PROPOFOL;  Surgeon: Eloise Harman, DO;  Location: [facility];  Service: Endoscopy;  Laterality: N/A;  2:00pm   GASTROSTOMY N/A 10/12/2021   Procedure: OPEN GASTROSTOMY TUBE;  Surgeon: Aviva Signs, MD;  Location: [facility] ORS;  Service: General;  Laterality: N/A;   PORTACATH PLACEMENT Left 10/12/2021   Procedure: INSERTION PORT-A-CATH;  Surgeon: Aviva Signs, MD;  Location: [facility] ORS;  Service: General;  Laterality: Left;   VIDEO BRONCHOSCOPY WITH RADIAL ENDOBRONCHIAL ULTRASOUND  01/23/2022   Procedure: VIDEO BRONCHOSCOPY WITH RADIAL ENDOBRONCHIAL ULTRASOUND;  Surgeon: Collene Gobble, MD;  Location: [facility] ENDOSCOPY;  Service: Pulmonary;;    SOCIAL HISTORY:  Social History  Socioeconomic History   Marital status: Divorced    Spouse name: Not on file   Number of children: Not on file   Years of education: Not on file   Highest education level: Not on file  Occupational History   Not on file  Tobacco Use   Smoking status: Some Days    Packs/day: 0.50    Types: Cigarettes   Smokeless tobacco: Never  Vaping Use    Vaping Use: Never used  Substance and Sexual Activity   Alcohol use: Not Currently    Alcohol/week: 14.0 standard drinks of alcohol    Types: 14 Cans of beer per week    Comment: 2- 3 16 oz beer daily.   Drug use: Never   Sexual activity: Not on file  Other Topics Concern   Not on file  Social History Narrative   Not on file   Social Determinants of Health   Financial Resource Strain: High Risk (09/28/2021)   Overall Financial Resource Strain (CARDIA)    Difficulty of Paying Living Expenses: Hard  Food Insecurity: Not on file  Transportation Needs: Not on file  Physical Activity: Not on file  Stress: Not on file  Social Connections: Not on file  Intimate Partner Violence: Not on file    FAMILY HISTORY:  Family History  Problem Relation Age of Onset   Cancer Father        prostate   Cancer Sister    Colon cancer Neg Hx    Esophageal cancer Neg Hx     CURRENT MEDICATIONS:  Current Outpatient Medications  Medication Sig Dispense Refill   albuterol (VENTOLIN HFA) 108 (90 Base) MCG/ACT inhaler Inhale 1-2 puffs into the lungs every 6 (six) hours as needed for wheezing or shortness of breath. 1 each 0   allopurinol (ZYLOPRIM) 300 MG tablet Take 1 tablet (300 mg total) by mouth daily. 30 tablet 5   amLODipine (NORVASC) 5 MG tablet Take 1 tablet (5 mg total) by mouth daily. 90 tablet 2   amoxicillin-clavulanate (AUGMENTIN) 400-57 MG/5ML suspension Take 10 mLs (800 mg total) by mouth 2 (two) times daily for 28 days. (Medication expires in 10 days. Will need go get 2 refills.) 600 mL 0   fluorouracil CALGB 73220 2,400 mg/m2 in sodium chloride 0.9 % 150 mL Inject 2,400 mg/m2 into the vein over 48 hr. Every 2 weeks     FLUOROURACIL IV Inject into the vein every 14 (fourteen) days.     KLOR-CON M20 20 MEQ tablet TAKE 2 TABLETS BY MOUTH DAILY 60 tablet 3   LEUCOVORIN CALCIUM IV Inject into the vein every 14 (fourteen) days.     loperamide (IMODIUM) 2 MG capsule Take 1 capsule (2 mg  total) by mouth as needed for diarrhea or loose stools (take as directed according to the instruction sheet you were given). 30 capsule 4   magnesium oxide (MAG-OX) 400 (240 Mg) MG tablet Take 1 tablet (400 mg total) by mouth 2 (two) times daily. 60 tablet 2   megestrol (MEGACE) 400 MG/10ML suspension Take 10 mLs (400 mg total) by mouth 2 (two) times daily. 480 mL 3   Nutritional Supplements (FEEDING SUPPLEMENT, OSMOLITE 1.5 CAL,) LIQD Place 237 mLs into feeding tube 5 (five) times daily. 35550 mL 1   OXALIPLATIN IV Inject into the vein every 14 (fourteen) days.     prochlorperazine (COMPAZINE) 10 MG tablet Take 1 tablet (10 mg total) by mouth every 6 (six) hours as needed for nausea or vomiting.  30 tablet 6   Vitamin D, Ergocalciferol, (DRISDOL) 1.25 MG (50000 UNIT) CAPS capsule Take 1 capsule (50,000 Units total) by mouth every Monday. 12 capsule 0   Water For Irrigation, Sterile (FREE WATER) SOLN Place 120 mLs into feeding tube 6 (six) times daily.     No current facility-administered medications for this visit.   Facility-Administered Medications Ordered in Other Visits  Medication Dose Route Frequency Provider Last Rate Last Admin   palonosetron (ALOXI) 0.25 MG/5ML injection             ALLERGIES:  Allergies  Allergen Reactions   Pork-Derived Products Other (See Comments)    Patient does not eat pork due to his religious belief    PHYSICAL EXAM:  Performance status (ECOG): 0 - Asymptomatic  Vitals:   02/01/22 0944  BP: 97/85  Pulse: (!) 107  Resp: 18  Temp: (!) 97 F (36.1 C)  SpO2: 100%   Wt Readings from Last 3 Encounters:  02/01/22 97 lb 3.2 oz (44.1 kg)  02/01/22 94 lb 3.2 oz (42.7 kg)  01/21/22 94 lb 12.8 oz (43 kg)   Physical Exam Vitals reviewed.  Constitutional:      Appearance: Normal appearance.  Cardiovascular:     Rate and Rhythm: Normal rate and regular rhythm.     Pulses: Normal pulses.     Heart sounds: Normal heart sounds.  Pulmonary:     Effort:  Pulmonary effort is normal.     Breath sounds: Normal breath sounds.  Neurological:     General: No focal deficit present.     Mental Status: He is alert and oriented to person, place, and time.  Psychiatric:        Mood and Affect: Mood normal.        Behavior: Behavior normal.     LABORATORY DATA:  I have reviewed the labs as listed.     Latest Ref Rng & Units 02/01/2022    9:22 AM 01/24/2022    6:18 AM 01/22/2022    2:44 AM  CBC  WBC 4.0 - 10.5 K/uL 15.9  8.4  3.4   Hemoglobin 13.0 - 17.0 g/dL 10.3  10.1  8.9   Hematocrit 39.0 - 52.0 % 30.0  29.4  26.5   Platelets 150 - 400 K/uL 297  228  143       Latest Ref Rng & Units 02/01/2022    9:22 AM 01/24/2022    6:18 AM 01/22/2022    2:44 AM  CMP  Glucose 70 - 99 mg/dL 142  103  89   BUN 6 - 20 mg/dL _0 Creatinine 0.61 - 1.24 mg/dL 0.65  0.93  0.86   Sodium 135 - 145 mmol/L 136  137  131   Potassium 3.5 - 5.1 mmol/L 2.2  3.4  3.8   Chloride 98 - 111 mmol/L 106  108  103   CO2 22 - 32 mmol/L _1 Calcium 8.9 - 10.3 mg/dL 8.9  9.0  8.9   Total Protein 6.5 - 8.1 g/dL 6.7     Total Bilirubin 0.3 - 1.2 mg/dL 0.5     Alkaline Phos 38 - 126 U/L 69     AST 15 - 41 U/L 22     ALT 0 - 44 U/L 10       DIAGNOSTIC IMAGING:  I have independently reviewed the scans and discussed with the patient. CT CHEST ABDOMEN PELVIS  W CONTRAST  Result Date: 01/31/2022 CLINICAL DATA:  Esophageal cancer.  Restaging. * Tracking Code: BO * EXAM: CT CHEST, ABDOMEN, AND PELVIS WITH CONTRAST TECHNIQUE: Multidetector CT imaging of the chest, abdomen and pelvis was performed following the standard protocol during bolus administration of intravenous contrast. RADIATION DOSE REDUCTION: This exam was performed according to the departmental dose-optimization program which includes automated exposure control, adjustment of the mA and/or kV according to patient size and/or use of iterative reconstruction technique. CONTRAST:  17m OMNIPAQUE IOHEXOL  300 MG/ML  SOLN COMPARISON:  PET-CT 09/22/2021 an CTA chest 01/21/2022 FINDINGS: CT CHEST FINDINGS Cardiovascular: The heart size is normal. No pericardial effusion. Mild aortic and coronary artery atherosclerotic calcifications. Mediastinum/Nodes: Thyroid gland and trachea appear normal. The proximal and midthoracic esophagus appears dilated and debris-filled. The distal esophageal mass is decreased in size when compared with the examination from 09/08/2021. Currently this measures 1.2 x 1.0 by 5.3 cm, image 98/8. On the previous exam this measured 3.7 x 2.5 by 9.0 cm. No enlarged axillary, supraclavicular, mediastinal, or hilar lymph nodes. Lungs/Pleura: Multifocal areas of ground-glass and tree-in-bud nodularity identified throughout the right upper lobe, right middle lobe and right lower lobe. There also ground-glass and tree-in-bud nodules identified within the posteroinferior left upper lobe. Areas of cystic bronchiectasis are identified within the right lower lobe. Imaging findings are favored to represent sequelae of chronic aspiration and infection. Previous juxta hilar left upper lobe lung nodule has resolved in the interval. Cavitary nodule within the posteromedial right lower lobe measures 1.1 cm, image 84/3. This is new when compared with baseline PET-CT from 09/22/2021. On the more recent CT angio chest from 01/21/2022 this appeared solid measuring measured 3.0 x 1.3 cm. The central peribronchovascular consolidation within the medial right lower lobe noted on the exam from 01/21/2022 measures 2.4 x 1.7 cm on today's study, image 99/3. On the recent CT of the chest this area measured 2.2 x 3.5 cm. Musculoskeletal: Multiple remote healed bilateral rib fractures are identified. Unchanged appearance of chronic appearing mild compression deformities within the upper and midthoracic spine, image 94/8. No suspicious bone lesions. CT ABDOMEN PELVIS FINDINGS Hepatobiliary: 4 mm too small to characterize  low-density structure within right lobe of liver is stable from 09/08/2021. No suspicious liver lesions identified. Gallbladder appears normal. No bile duct dilatation. Pancreas: Unremarkable. No pancreatic ductal dilatation or surrounding inflammatory changes. Spleen: Normal in size without focal abnormality. Adrenals/Urinary Tract: Normal adrenal glands. Small bilateral kidney cysts are noted. No follow-up imaging recommended. No nephrolithiasis or hydronephrosis. Urinary bladder is unremarkable. Stomach/Bowel: Percutaneous gastrostomy tube is identified within the stomach. No pathologic dilatation of the large or small bowel loops. No signs of bowel wall thickening or inflammation. Vascular/Lymphatic: Mild aortic atherosclerotic calcifications. Upper abdominal vascularity is patent. No signs of abdominopelvic adenopathy. Reproductive: Prostate is unremarkable. Other: No abdominal wall hernia or abnormality. No abdominopelvic ascites. Musculoskeletal: No aggressive bone lesions identified. There are no CT correlates on today's exam to correspond with the FDG avid intramuscular lesions within the left deltoid muscle and left erector spinae musculature. IMPRESSION: 1. Interval decrease in size of distal esophageal mass compatible with response to therapy. 2. Interval resolution of previous FDG avid upper abdominal nodal metastasis. 3. Previous FDG avid skeletal muscle lesions are not seen on today's exam. 4. Multifocal areas of ground-glass and tree-in-bud nodularity identified throughout the right upper lobe, right middle lobe and right lower lobe. There also ground-glass and tree-in-bud nodules identified within the posteroinferior left upper lobe. Imaging  findings are favored to represent sequelae of chronic aspiration and infection. 5. Cavitary nodule within the posteromedial right lower lobe is new when compared with baseline PET-CT from 09/22/2021. On the more recent CT angio chest from 01/21/2022 this  appeared solid measuring measured 3.0 x 1.3 cm. This is favored to represent an area of post infectious/inflammatory change. 6. Decrease in size of central perihilar consolidation within the right lower lobe which is favored to represent an area of inflammation or infection. 7. Coronary artery atherosclerotic calcifications. 8.  Aortic Atherosclerosis (ICD10-I70.0). Electronically Signed   By: Kerby Moors M.D.   On: 01/31/2022 16:37   DG Chest Port 1 View  Result Date: 01/23/2022 CLINICAL DATA:  Bronchoscopy today. EXAM: PORTABLE CHEST 1 VIEW COMPARISON:  01/21/2022 FINDINGS: Left-sided porta catheter in place. Known infiltrate in the right lower lung. No effusion or pneumothorax. Normal heart size. Percutaneous gastrostomy tube. Remote rib fractures. IMPRESSION: No complicating features of bronchoscopy. Known infiltrate in the right lower lobe. Electronically Signed   By: Jorje Guild M.D.   On: 01/23/2022 11:34   DG C-ARM BRONCHOSCOPY  Result Date: 01/23/2022 C-ARM BRONCHOSCOPY: Fluoroscopy was utilized by the requesting physician.  No radiographic interpretation.   DG C-Arm 1-60 Min-No Report  Result Date: 01/23/2022 Fluoroscopy was utilized by the requesting physician.  No radiographic interpretation.   CT Angio Chest PE W/Cm &/Or Wo Cm  Result Date: 01/21/2022 CLINICAL DATA:  Difficulty breathing.  Esophageal cancer. EXAM: CT ANGIOGRAPHY CHEST WITH CONTRAST TECHNIQUE: Multidetector CT imaging of the chest was performed using the standard protocol during bolus administration of intravenous contrast. Multiplanar CT image reconstructions and MIPs were obtained to evaluate the vascular anatomy. RADIATION DOSE REDUCTION: This exam was performed according to the departmental dose-optimization program which includes automated exposure control, adjustment of the mA and/or kV according to patient size and/or use of iterative reconstruction technique. CONTRAST:  150m OMNIPAQUE IOHEXOL 350 MG/ML  SOLN COMPARISON:  12/15/2021 FINDINGS: Cardiovascular: Satisfactory opacification of the pulmonary arteries to the segmental level. No evidence of pulmonary embolism. Normal heart size. No pericardial effusion. Porta catheter from the left in unremarkable position. Mediastinum/Nodes: Increased size of a right paratracheal node, 8 mm in short axis, possibly reactive in this setting. Similar size subcarinal node. Unchanged appearance of the esophagus. Lungs/Pleura: Patchy opacity in the right lower lobe, 1 showing tiny focus of cavitation. Pre-existing cavitary focus with fluid level in the medial right lower lobe measures 20 mm at the fluid collection enlarged. Adjacent right lower lobe airways show low-density opacification. No pleural effusion or pneumothorax. Upper Abdomen: No acute finding Musculoskeletal: Numerous remote right rib fractures with hypertrophic healing. No acute or aggressive finding. Review of the MIP images confirms the above findings. IMPRESSION: 1. Negative for pulmonary embolism. 2. Worsening airspace disease in the right lower lobe, rapid change and associated low-density airway filling suggesting endobronchial spread of infection. Pre-existing cavitary opacity in the right lower lobe has enlarged with cavitary focus now measuring 2 cm; affected airways are in close proximity to the cavity. Underlying pulmonary mass is not excluded in the setting of stage IV esophageal cancer. Electronically Signed   By: JJorje GuildM.D.   On: 01/21/2022 05:04   DG Chest Portable 1 View  Result Date: 01/21/2022 CLINICAL DATA:  Shortness of breath EXAM: PORTABLE CHEST 1 VIEW COMPARISON:  01/04/2022 FINDINGS: Left-sided chest port is again identified and stable. Lungs are well aerated bilaterally. Healed rib fractures are noted bilaterally. Cardiac shadow is within normal limits. IMPRESSION:  No acute abnormality noted. Electronically Signed   By: Inez Catalina M.D.   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 - 34 pound weight loss in the last 2 months. - EGD (08/26/2021): A large fungating ulcerating mass found in the lower third of the esophagus, 30 cm from incisors.  Nearly obstructing and circumferential.  Scope could not be advanced. - Pathology: Invasive moderately to poorly differentiated squamous cell carcinoma. - CT CAP with contrast (09/08/2021): Circumferential irregular wall thickening along the length of approximately 9 cm in the mid and distal thoracic esophagus.  Haziness in the periesophageal fat suggesting transmural extension of the tumor.  Small adjacent 6 mm paraesophageal lymph node.  1.3 cm endobronchial versus peribronchial smooth nodule adjacent to the segmental bronchus within the lingula.  Differential includes intrapulmonary lymph node/endobronchial neoplasm. - PET scan (09/22/2021): Left lung nodule PET positive.  Left deltoid uptake SUV 6.9, left erector spine musculature adjacent to L3 transverse process SUV 8.4, left gluteal activity with SUV 2.97.  Positive for large esophageal mass involving half to one third of the distal esophagus with upper abdominal nodal metastasis. - Left deltoid biopsy (10/13/2021): Keratinizing squamous cell carcinoma - FOLFOX + Nivolumab started on 10/26/2021 - [facility] NGS: PD-L1 CPS 15%, HER2 negative, MSI-stable    Social/family history: - He lives by himself.  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 - Current active smoker, half pack per day for 10 years.  He drinks beer daily. - Father had colon cancer and sister had stomach cancer.   PLAN:  Stage IV (T3N1 M1 G3) lower esophageal squamous cell carcinoma: - He completed 6 cycles of FOLFOX and nivolumab on 01/04/2022. - I have held his treatments as he is continuing to lose weight. - I have reviewed recent hospitalization records from 01/21/2022 through 01/25/2022 with lung  abscess/pneumonia.  He also underwent bronchoscopy which was negative for malignancy.   - I have reviewed CT CAP from 01/31/2022 which showed decrease in size of distal esophageal mass, resolution of previous FDG avid upper abdominal nodal metastasis.  Previous FDG avid skeletal muscle lesions are not seen.  Cavitary nodule within the posterior medial right lower lobe new compared to baseline PET scan.  Favored to represent infection/inflammation. - I will continue to hold his treatment at this time.  Reevaluate in 3 weeks.  2.  Weight loss: - He lost more than 30 pounds since start of therapy. - He lost 9 pounds since last visit.  He was hospitalized recently. - He is taking an Osmolite 1.5, 5 cans/day every 3 hours.  I have told him to try to increase it to 6 cans/day.   3.  Hypokalemia: - He reports that he cannot swallow potassium.  Potassium is severely low at 2.2 today.  He will receive IV and liquid potassium.  We will send liquid potassium to his home.  4.  Microcytic anemia: - He has received Venofer in the past.  Hemoglobin today is 10.3 with MCV 74.  He has microcytosis from underlying trait.   Orders placed this encounter:  No orders of the defined types were placed in this encounter.    Derek Jack, MD [place] [phone]

## 2022-02-06 ENCOUNTER — Encounter: Payer: 59 | Admitting: Dietician

## 2022-02-06 NOTE — Telephone Encounter (Addendum)
Nutrition Follow-up:  Patient with stage IV esophageal cancer. He is receiving FOLFOX + Nivolumab q14d (started 7/5). S/p open G-tube 6/21. Treatment currently on hold secondary to continued wt loss. Last chemotherapy on 9/13   9/30-10/4 hospital admission   Noted CT CAP on 10/10 showing response to treatment  - decrease in size of esophageal mass  - resolution of previous upper abdominal nodal mets  - previous skeletal muscle lesions not seen  Spoke with patient via telephone. 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 He reports ongoing weakness from recent admission. This is slowly improving. Patient is motivated to gain weight so he is able to resume therapy. He is pleased with results of last scan. Patient says he is drinking broth by mouth. He is unable to tolerate solid food. Patient reports giving 5 cartons Osmolite 1.5 via tube. He is also giving one carton [employer] 1.4 in the evenings. Patient says he is tolerating additional carton well. He denies nausea, vomiting, diarrhea, constipation.    Medications: reviewed   Labs: 10/11 - K 2.2 (replaced), glucose 142, albumin 2.7  Anthropometrics: Last weight 97 lb 3.2 oz on 10/11 increased  9/30 - 94 lb 12.8 oz 9/20 - 93 lb 0.6 oz 8/30 - 104 lb 3.2 oz    NUTRITION DIAGNOSIS: Severe malnutrition continues    INTERVENTION:  Continue 5 cartons Osmolite 1.5 + 1 carton [employer] 1.4 (2230 kcal, 94.5 g protein) Encouraged oral intake of thin liquids as tolerated Ongoing supportive listening and encouragement provided    MONITORING, EVALUATION, GOAL: weight trends, intake, tube feeding   NEXT VISIT: Monday October 23 in office for wt check (pt aware)

## 2022-02-07 ENCOUNTER — Ambulatory Visit: Payer: Self-pay | Admitting: *Deleted

## 2022-02-07 ENCOUNTER — Encounter: Payer: Self-pay | Admitting: *Deleted

## 2022-02-07 ENCOUNTER — Telehealth: Payer: Self-pay | Admitting: Family Medicine

## 2022-02-07 DIAGNOSIS — I10 Essential (primary) hypertension: Secondary | ICD-10-CM | POA: Diagnosis not present

## 2022-02-07 DIAGNOSIS — Z792 Long term (current) use of antibiotics: Secondary | ICD-10-CM | POA: Diagnosis not present

## 2022-02-07 DIAGNOSIS — R1310 Dysphagia, unspecified: Secondary | ICD-10-CM | POA: Diagnosis not present

## 2022-02-07 DIAGNOSIS — J984 Other disorders of lung: Secondary | ICD-10-CM | POA: Diagnosis not present

## 2022-02-07 DIAGNOSIS — J189 Pneumonia, unspecified organism: Secondary | ICD-10-CM | POA: Diagnosis not present

## 2022-02-07 DIAGNOSIS — E43 Unspecified severe protein-calorie malnutrition: Secondary | ICD-10-CM | POA: Diagnosis not present

## 2022-02-07 DIAGNOSIS — C159 Malignant neoplasm of esophagus, unspecified: Secondary | ICD-10-CM | POA: Diagnosis not present

## 2022-02-07 DIAGNOSIS — D509 Iron deficiency anemia, unspecified: Secondary | ICD-10-CM | POA: Diagnosis not present

## 2022-02-07 DIAGNOSIS — R918 Other nonspecific abnormal finding of lung field: Secondary | ICD-10-CM | POA: Diagnosis not present

## 2022-02-07 DIAGNOSIS — M109 Gout, unspecified: Secondary | ICD-10-CM | POA: Diagnosis not present

## 2022-02-07 NOTE — Patient Instructions (Signed)
Visit Information  Thank you for taking time to visit with me today. Please don't hesitate to contact me if I can be of assistance to you.   Following are the goals we discussed today:   Goals Addressed               This Visit's Progress     Patient Stated     Improve Nutriton/increase weight [facility]) (pt-stated)   On track     Care Coordination Interventions: Reviewed upcoming provider appointments and treatment appointments Assessed available transportation to appointments and treatments. Has consistent/reliable transportation: Yes Nutrition assessment performed- At risk/presence of malnutrition Assess for weight change and supplement nutritional intake (ensure, Osmolite)  Provided verbal education on how to make smoothies & sent a few to his e-mail address he confirmed       Manage the worsening symptoms of Esophageal cancer ([facility]) (pt-stated)   Not on track     Care Coordination Interventions: PHQ2/PHQ9 performed Answered questions and provided information on the decrease size of his carcinoma Encouragement provided  Assessed for worsening symptoms Confirm patient has medications to manage nausea        Our next appointment is by telephone on 02/21/22 at 1100  Please call the care guide team at [phone] if you need to cancel or reschedule your appointment.   If you are experiencing a Mental Health or [place] or need someone to talk to, please call the Suicide and Crisis Lifeline: 988 call the [place] National Suicide Prevention Lifeline: 714-187-3955 or TTY: (517)407-3016 TTY (281) 860-3939) to talk to a trained counselor call 1-800-273-TALK (toll free, 24 hour hotline) call the [facility]: [phone]) [phone] call 911   The patient verbalized understanding of instructions, educational materials, and care plan provided today and DECLINED offer to receive copy of patient instructions, educational materials, and care plan.   The patient has  been provided with contact information for the care management team and has been advised to call with any health related questions or concerns.   Rikita Grabert L. Lavina Hamman, RN, BSN, [place] Coordinator Office number [phone]

## 2022-02-07 NOTE — Telephone Encounter (Signed)
Trevor [name] with [facility] [phone]  Called requesting verbal orders for Occupation therapy 2wk x 4???

## 2022-02-07 NOTE — Patient Outreach (Signed)
  Care Coordination   Initial Visit Note   02/07/2022 Name: Trevor [name] MRN: [id number] DOB: August 12, 1970  Trevor [name] is a 51 y.o. year old male who sees [name], [place] for primary care. I spoke with  Trevor [name] by phone today.  What matters to the patients health and wellness today?  He was interested in the decrease in size of his tumor, audible congestion (throat), active with [facility] OT but shortness of breath & fatigue are hindering progress per patient   Esophageal cancer size & worsening symptoms   Reduced with last imaging He voice understanding that the size is now 1.2 x 1.0 by 5.3 cm, image 98/8. On the previous exam this measured 3.7 x 2.5 by 9.0 cm.  Nausea is reported to be his present worsening symptom   Respiratory  Short of breath He feels bad about not being able to walk around like he previously could  He confirms he is to see Dr Baltazar Apo Pulmonologist on next week He voiced understanding that the appointment is on  02/14/2022 at 10:30 AM  Loss of weight Reports he had decreased weight to 94 lbs He is taking in ensure & chicken broth He voiced interest in smoothies and agrees to have some recipes e-mailed to him  He voiced understanding that his mucus may increase with certain milk products      Goals Addressed               This Visit's Progress     Patient Stated     Improve Nutriton/increase weight ([facility]) (pt-stated)   On track     Care Coordination Interventions: Reviewed upcoming provider appointments and treatment appointments Assessed available transportation to appointments and treatments. Has consistent/reliable transportation: Yes Nutrition assessment performed- At risk/presence of malnutrition Assess for weight change and supplement nutritional intake (ensure, Osmolite)  Provided verbal education on how to make smoothies & sent a few to his e-mail address he confirmed       Manage the worsening symptoms of Esophageal cancer  ([facility]) (pt-stated)   Not on track     Care Coordination Interventions: PHQ2/PHQ9 performed Answered questions and provided information on the decrease size of his carcinoma Encouragement provided  Assessed for worsening symptoms Confirm patient has medications to manage nausea        SDOH assessments and interventions completed:  Yes  SDOH Interventions Today    Flowsheet Row Most Recent Value  SDOH Interventions   Food Insecurity Interventions Intervention Not Indicated  Housing Interventions Intervention Not Indicated  Transportation Interventions Intervention Not Indicated  Utilities Interventions Intervention Not Indicated  Stress Interventions Intervention Not Indicated        Care Coordination Interventions Activated:  Yes  Care Coordination Interventions:  Yes, provided   Follow up plan: Follow up call scheduled for 02/21/22 1100    Encounter Outcome:  Pt. Visit Completed   Johnella Crumm L. Lavina Hamman, RN, BSN, [place] Coordinator Office number [phone]

## 2022-02-07 NOTE — Telephone Encounter (Signed)
Left a vm.

## 2022-02-13 ENCOUNTER — Telehealth: Payer: Self-pay | Admitting: Dietician

## 2022-02-13 ENCOUNTER — Telehealth: Payer: Self-pay | Admitting: Family Medicine

## 2022-02-13 ENCOUNTER — Inpatient Hospital Stay: Payer: 59 | Admitting: Dietician

## 2022-02-13 DIAGNOSIS — C159 Malignant neoplasm of esophagus, unspecified: Secondary | ICD-10-CM | POA: Diagnosis not present

## 2022-02-13 DIAGNOSIS — R1310 Dysphagia, unspecified: Secondary | ICD-10-CM | POA: Diagnosis not present

## 2022-02-13 DIAGNOSIS — Z792 Long term (current) use of antibiotics: Secondary | ICD-10-CM | POA: Diagnosis not present

## 2022-02-13 DIAGNOSIS — R918 Other nonspecific abnormal finding of lung field: Secondary | ICD-10-CM | POA: Diagnosis not present

## 2022-02-13 DIAGNOSIS — M109 Gout, unspecified: Secondary | ICD-10-CM | POA: Diagnosis not present

## 2022-02-13 DIAGNOSIS — E43 Unspecified severe protein-calorie malnutrition: Secondary | ICD-10-CM | POA: Diagnosis not present

## 2022-02-13 DIAGNOSIS — D509 Iron deficiency anemia, unspecified: Secondary | ICD-10-CM | POA: Diagnosis not present

## 2022-02-13 DIAGNOSIS — J189 Pneumonia, unspecified organism: Secondary | ICD-10-CM | POA: Diagnosis not present

## 2022-02-13 DIAGNOSIS — J984 Other disorders of lung: Secondary | ICD-10-CM | POA: Diagnosis not present

## 2022-02-13 DIAGNOSIS — I10 Essential (primary) hypertension: Secondary | ICD-10-CM | POA: Diagnosis not present

## 2022-02-13 LAB — CULTURE, FUNGUS WITHOUT SMEAR

## 2022-02-13 NOTE — Telephone Encounter (Signed)
Received telephone call from pt requesting to cancel nutrition appointment today. Pt reports he had a fall at home and has injured his shoulder. Pt states his brother is taking him to [facility] ED for evaluation. Will reschedule appointment as able.

## 2022-02-13 NOTE — Telephone Encounter (Signed)
Spoke to Alexander, she reports pt fell on 02/13/2022 before she arrived for visitation, pt reported pain level was 3/10, low blood pressure. Pt scheduled for appt on 02/28/2022.

## 2022-02-13 NOTE — Progress Notes (Signed)
See telephone note.

## 2022-02-13 NOTE — Telephone Encounter (Signed)
Lelan Pons, [phone]  Called to report fall this am on pt. Please contact her for detailed info on fall.

## 2022-02-14 ENCOUNTER — Other Ambulatory Visit: Payer: Self-pay

## 2022-02-14 ENCOUNTER — Emergency Department (HOSPITAL_COMMUNITY): Payer: 59

## 2022-02-14 ENCOUNTER — Inpatient Hospital Stay (HOSPITAL_COMMUNITY)
Admission: EM | Admit: 2022-02-14 | Discharge: 2022-02-22 | DRG: 640 | Disposition: A | Discharge status: Home Health | Payer: 59 | Attending: Internal Medicine | Admitting: Internal Medicine

## 2022-02-14 ENCOUNTER — Inpatient Hospital Stay: Payer: 59 | Admitting: Emergency Medicine

## 2022-02-14 ENCOUNTER — Encounter (HOSPITAL_COMMUNITY): Payer: Self-pay | Admitting: Emergency Medicine

## 2022-02-14 DIAGNOSIS — E860 Dehydration: Principal | ICD-10-CM | POA: Diagnosis present

## 2022-02-14 DIAGNOSIS — Z681 Body mass index (BMI) 19 or less, adult: Secondary | ICD-10-CM

## 2022-02-14 DIAGNOSIS — J189 Pneumonia, unspecified organism: Secondary | ICD-10-CM

## 2022-02-14 DIAGNOSIS — Z515 Encounter for palliative care: Secondary | ICD-10-CM | POA: Diagnosis not present

## 2022-02-14 DIAGNOSIS — I951 Orthostatic hypotension: Secondary | ICD-10-CM | POA: Diagnosis present

## 2022-02-14 DIAGNOSIS — J851 Abscess of lung with pneumonia: Secondary | ICD-10-CM | POA: Diagnosis not present

## 2022-02-14 DIAGNOSIS — R1114 Bilious vomiting: Secondary | ICD-10-CM

## 2022-02-14 DIAGNOSIS — Z91014 Allergy to mammalian meats: Secondary | ICD-10-CM

## 2022-02-14 DIAGNOSIS — C159 Malignant neoplasm of esophagus, unspecified: Secondary | ICD-10-CM | POA: Diagnosis not present

## 2022-02-14 DIAGNOSIS — E43 Unspecified severe protein-calorie malnutrition: Secondary | ICD-10-CM

## 2022-02-14 DIAGNOSIS — Z931 Gastrostomy status: Secondary | ICD-10-CM

## 2022-02-14 DIAGNOSIS — C799 Secondary malignant neoplasm of unspecified site: Secondary | ICD-10-CM | POA: Diagnosis present

## 2022-02-14 DIAGNOSIS — K219 Gastro-esophageal reflux disease without esophagitis: Secondary | ICD-10-CM | POA: Diagnosis present

## 2022-02-14 DIAGNOSIS — E861 Hypovolemia: Secondary | ICD-10-CM

## 2022-02-14 DIAGNOSIS — N179 Acute kidney failure, unspecified: Secondary | ICD-10-CM | POA: Diagnosis not present

## 2022-02-14 DIAGNOSIS — R627 Adult failure to thrive: Secondary | ICD-10-CM | POA: Diagnosis not present

## 2022-02-14 DIAGNOSIS — J984 Other disorders of lung: Secondary | ICD-10-CM

## 2022-02-14 DIAGNOSIS — R079 Chest pain, unspecified: Secondary | ICD-10-CM | POA: Diagnosis not present

## 2022-02-14 DIAGNOSIS — R634 Abnormal weight loss: Secondary | ICD-10-CM | POA: Diagnosis present

## 2022-02-14 DIAGNOSIS — D509 Iron deficiency anemia, unspecified: Secondary | ICD-10-CM | POA: Diagnosis not present

## 2022-02-14 DIAGNOSIS — R0789 Other chest pain: Secondary | ICD-10-CM | POA: Diagnosis not present

## 2022-02-14 DIAGNOSIS — E162 Hypoglycemia, unspecified: Secondary | ICD-10-CM | POA: Diagnosis not present

## 2022-02-14 DIAGNOSIS — Z20822 Contact with and (suspected) exposure to covid-19: Secondary | ICD-10-CM | POA: Diagnosis not present

## 2022-02-14 DIAGNOSIS — G621 Alcoholic polyneuropathy: Secondary | ICD-10-CM | POA: Diagnosis present

## 2022-02-14 DIAGNOSIS — I9589 Other hypotension: Secondary | ICD-10-CM | POA: Diagnosis not present

## 2022-02-14 DIAGNOSIS — F17210 Nicotine dependence, cigarettes, uncomplicated: Secondary | ICD-10-CM | POA: Diagnosis present

## 2022-02-14 DIAGNOSIS — E876 Hypokalemia: Secondary | ICD-10-CM | POA: Diagnosis not present

## 2022-02-14 DIAGNOSIS — R531 Weakness: Secondary | ICD-10-CM | POA: Diagnosis not present

## 2022-02-14 DIAGNOSIS — E872 Acidosis, unspecified: Secondary | ICD-10-CM

## 2022-02-14 DIAGNOSIS — I10 Essential (primary) hypertension: Secondary | ICD-10-CM | POA: Diagnosis present

## 2022-02-14 DIAGNOSIS — E11649 Type 2 diabetes mellitus with hypoglycemia without coma: Secondary | ICD-10-CM | POA: Diagnosis not present

## 2022-02-14 DIAGNOSIS — R112 Nausea with vomiting, unspecified: Secondary | ICD-10-CM | POA: Diagnosis not present

## 2022-02-14 DIAGNOSIS — Z79899 Other long term (current) drug therapy: Secondary | ICD-10-CM

## 2022-02-14 DIAGNOSIS — I959 Hypotension, unspecified: Secondary | ICD-10-CM | POA: Diagnosis present

## 2022-02-14 LAB — TROPONIN I (HIGH SENSITIVITY)
Troponin I (High Sensitivity): 37 ng/L — ABNORMAL HIGH (ref <18)
Troponin I (High Sensitivity): 32 ng/L — ABNORMAL HIGH (ref <18)

## 2022-02-14 LAB — COMPREHENSIVE METABOLIC PANEL
ALT: 11 U/L (ref 0–44)
AST: 27 U/L (ref 15–41)
Albumin: 3.1 g/dL — ABNORMAL LOW (ref 3.5–5.0)
Alkaline Phosphatase: 81 U/L (ref 38–126)
Anion gap: 19 — ABNORMAL HIGH (ref 5–15)
BUN: 33 mg/dL — ABNORMAL HIGH (ref 6–20)
CO2: 20 mmol/L — ABNORMAL LOW (ref 22–32)
Calcium: 10.3 mg/dL (ref 8.9–10.3)
Chloride: 101 mmol/L (ref 98–111)
Creatinine, Ser: 1.24 mg/dL (ref 0.61–1.24)
GFR, Estimated: >60 mL/min (ref >60)
Glucose, Bld: 79 mg/dL (ref 70–99)
Potassium: 3.2 mmol/L — ABNORMAL LOW (ref 3.5–5.1)
Sodium: 140 mmol/L (ref 135–145)
Total Bilirubin: 0.9 mg/dL (ref 0.3–1.2)
Total Protein: 7.9 g/dL (ref 6.5–8.1)

## 2022-02-14 LAB — CBC
HCT: 36.6 % — ABNORMAL LOW (ref 39.0–52.0)
Hemoglobin: 11.9 g/dL — ABNORMAL LOW (ref 13.0–17.0)
MCH: 25.6 pg — ABNORMAL LOW (ref 26.0–34.0)
MCHC: 32.5 g/dL (ref 30.0–36.0)
MCV: 78.9 fL — ABNORMAL LOW (ref 80.0–100.0)
Platelets: 256 10*3/uL (ref 150–400)
RBC: 4.64 MIL/uL (ref 4.22–5.81)
RDW: 22.4 % — ABNORMAL HIGH (ref 11.5–15.5)
WBC: 12.3 10*3/uL — ABNORMAL HIGH (ref 4.0–10.5)
nRBC: 0 % (ref 0.0–0.2)

## 2022-02-14 LAB — CBG MONITORING, ED
Glucose-Capillary: 64 mg/dL — ABNORMAL LOW (ref 70–99)
Glucose-Capillary: 97 mg/dL (ref 70–99)
Glucose-Capillary: 78 mg/dL (ref 70–99)

## 2022-02-14 MED ORDER — DEXTROSE 10 % IV SOLN
100.0000 mL | Freq: Once | INTRAVENOUS | Status: AC
  Administered 2022-02-14: 100 mL via INTRAVENOUS

## 2022-02-14 MED ORDER — ENOXAPARIN SODIUM 30 MG/0.3ML IJ SOSY
30.0000 mg | PREFILLED_SYRINGE | INTRAMUSCULAR | Status: DC
  Administered 2022-02-14: 30 mg via SUBCUTANEOUS
  Filled 2022-02-14 (×6): qty 0.3

## 2022-02-14 MED ORDER — SODIUM CHLORIDE 0.9 % IV SOLN: 250.0000 mL | INTRAVENOUS | Status: DC | PRN

## 2022-02-14 MED ORDER — LACTATED RINGERS IV SOLN: INTRAVENOUS | Status: AC

## 2022-02-14 MED ORDER — AMOXICILLIN-POT CLAVULANATE 400-57 MG/5ML PO SUSR
800.0000 mg | Freq: Two times a day (BID) | ORAL | Status: DC
  Administered 2022-02-14 – 2022-02-19 (×10): 800 mg
  Filled 2022-02-14 (×12): qty 10

## 2022-02-14 MED ORDER — SODIUM CHLORIDE 0.9% FLUSH
3.0000 mL | Freq: Two times a day (BID) | INTRAVENOUS | Status: DC
  Administered 2022-02-14 – 2022-02-18 (×8): 3 mL via INTRAVENOUS

## 2022-02-14 MED ORDER — ONDANSETRON 4 MG PO TBDP
4.0000 mg | ORAL_TABLET | Freq: Once | ORAL | Status: AC
  Administered 2022-02-14: 4 mg via ORAL
  Filled 2022-02-14: qty 1

## 2022-02-14 MED ORDER — VITAMIN D (ERGOCALCIFEROL) 1.25 MG (50000 UNIT) PO CAPS
50000.0000 [IU] | ORAL_CAPSULE | ORAL | Status: DC
  Administered 2022-02-20: 50000 [IU] via ORAL
  Filled 2022-02-14: qty 1

## 2022-02-14 MED ORDER — ONDANSETRON HCL 4 MG/2ML IJ SOLN
4.0000 mg | Freq: Four times a day (QID) | INTRAMUSCULAR | Status: DC | PRN
  Administered 2022-02-16: 4 mg via INTRAVENOUS
  Filled 2022-02-14: qty 2

## 2022-02-14 MED ORDER — ALLOPURINOL 300 MG PO TABS
300.0000 mg | ORAL_TABLET | Freq: Every day | ORAL | Status: DC
  Filled 2022-02-14: qty 1

## 2022-02-14 MED ORDER — MAGNESIUM OXIDE -MG SUPPLEMENT 400 (240 MG) MG PO TABS
400.0000 mg | ORAL_TABLET | Freq: Every day | ORAL | Status: DC
  Administered 2022-02-15 – 2022-02-16 (×2): 400 mg
  Filled 2022-02-14 (×2): qty 1

## 2022-02-14 MED ORDER — MEGESTROL ACETATE 400 MG/10ML PO SUSP
400.0000 mg | Freq: Two times a day (BID) | ORAL | Status: DC
  Administered 2022-02-15 – 2022-02-21 (×13): 400 mg
  Filled 2022-02-14 (×19): qty 10

## 2022-02-14 MED ORDER — MEGESTROL ACETATE 400 MG/10ML PO SUSP
400.0000 mg | Freq: Two times a day (BID) | ORAL | Status: DC
  Filled 2022-02-14: qty 10

## 2022-02-14 MED ORDER — POTASSIUM CHLORIDE 10 MEQ/100ML IV SOLN
10.0000 meq | INTRAVENOUS | Status: AC
  Administered 2022-02-14 (×2): 10 meq via INTRAVENOUS
  Filled 2022-02-14 (×2): qty 100

## 2022-02-14 MED ORDER — POTASSIUM CHLORIDE 20 MEQ PO PACK: 40.0000 meq | PACK | Freq: Every day | ORAL | Status: DC

## 2022-02-14 MED ORDER — OSMOLITE 1.5 CAL PO LIQD
237.0000 mL | Freq: Every day | ORAL | Status: DC
  Administered 2022-02-14: 237 mL
  Filled 2022-02-14: qty 237

## 2022-02-14 MED ORDER — FREE WATER
120.0000 mL | Freq: Every day | Status: DC
  Administered 2022-02-14 – 2022-02-16 (×9): 120 mL

## 2022-02-14 MED ORDER — IOHEXOL 350 MG/ML SOLN
70.0000 mL | Freq: Once | INTRAVENOUS | Status: AC | PRN
  Administered 2022-02-14: 70 mL via INTRAVENOUS

## 2022-02-14 MED ORDER — MAGNESIUM OXIDE -MG SUPPLEMENT 400 (240 MG) MG PO TABS: 400.0000 mg | ORAL_TABLET | Freq: Every day | ORAL | Status: DC

## 2022-02-14 MED ORDER — SODIUM CHLORIDE 0.9% FLUSH: 3.0000 mL | INTRAVENOUS | Status: DC | PRN

## 2022-02-14 MED ORDER — ALLOPURINOL 300 MG PO TABS
300.0000 mg | ORAL_TABLET | Freq: Every day | ORAL | Status: DC
  Administered 2022-02-15 – 2022-02-21 (×7): 300 mg
  Filled 2022-02-14 (×3): qty 1
  Filled 2022-02-14: qty 3
  Filled 2022-02-14 (×3): qty 1

## 2022-02-14 NOTE — Assessment & Plan Note (Signed)
-  Follows Dr. Delton Coombes at [facility]  - previously on FOLFOX +Nivolumab -completed 6 cycles on 01/04/2022 but on hold due to continual weight loss -takes osmolite through PEG but continues to take solid by mouth

## 2022-02-14 NOTE — H&P (Signed)
History and Physical    Patient: Trevor [name] NWG:[id number] DOB: 10-26-1970 DOA: 02/14/2022 DOS: the patient was seen and examined on 02/14/2022 PCP: [name], [place]  Patient coming from: Home  Chief Complaint:  Chief Complaint  Patient presents with   Weakness   Chest Pain   HPI: Trevor [name] is a 51 y.o. 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  Has been having 5 to 6 days of persistent nausea and bilious vomiting.  Has felt some mild abdominal pain around his PEG tube.  He still takes solid by mouth.  Denies any fevers or chills.  No diarrhea.  He was recently hospitalized from 01/21/2022 to 01/25/2022 with progressive weakness, fatigue and shortness of breath.  He had symptoms of cough treated with steroids and multiple rounds of antibiotics outpatient since August.  He was admitted for suspected lung abscess and underwent bronchoscopy on 10/2.  Frozen slides negative for malignancy.  He was discharged with 4 weeks of Augmentin.  In the ED, he had a rectal temperature of 97.7, mildly tachycardic and hypotensive in the 90s over 40s on room air.  WBC of 12.3, hemoglobin of 11.9  Sodium of 140, potassium 3.2, CO2 of 20, creatinine of 1.24 and anion gap of 19.  CBG around 60s.  LFTs within normal limits.  Normal total bilirubin  Troponin elevated at 37 and down to 32.  CTA chest without pulmonary embolism, decrease severity of tree-in-bud-infiltrate. Dilated and debris filled proximal and mid-esophagus similar to prior imaging. Review of Systems: As mentioned in the history of present illness. All other systems reviewed and are negative. Past Medical History:  Diagnosis Date   Diabetes mellitus without complication ([place])    Esophageal cancer ([place])    Hypertension    Past Surgical History:  Procedure Laterality Date    BIOPSY  08/26/2021   Procedure: BIOPSY;  Surgeon: Eloise Harman, DO;  Location[facility] END[facility];  Service: Endoscopy;;   BRONCHIAL BIOPSY  01/23/2022   Procedure: BRONCHIAL BIOPSIES;  Surgeon: Collene Gobble, MD;  Location: [facility] ENDOSCOPY;  Service: Pulmonary;;   BRONCHIAL BRUSHINGS  01/23/2022   Procedure: BRONCHIAL BRUSHINGS;  Surgeon: Collene Gobble, MD;  Location: [facility] ENDOSCOPY;  Service: Pulmonary;;   BRONCHIAL NEEDLE ASPIRATION BIOPSY  01/23/2022   Procedure: BRONCHIAL NEEDLE ASPIRATION BIOPSIES;  Surgeon: Collene Gobble, MD;  Location: [facility] ENDOSCOPY;  Service: Pulmonary;;   BRONCHIAL WASHINGS  01/23/2022   Procedure: BRONCHIAL WASHINGS;  Surgeon: Collene Gobble, MD;  Location: [place] ENDOSCOPY;  Service: Pulmonary;;   ESOPHAGOGASTRODUODENOSCOPY (EGD) WITH PROPOFOL N/A 08/26/2021   Procedure: ESOPHAGOGASTRODUODENOSCOPY (EGD) WITH PROPOFOL;  Surgeon: Eloise Harman, DO;  Location: [facility];  Service: Endoscopy;  Laterality: N/A;  2:00pm   GASTROSTOMY N/A 10/12/2021   Procedure: OPEN GASTROSTOMY TUBE;  Surgeon: Aviva Signs, MD;  Location: [facility] ORS;  Service: General;  Laterality: N/A;   PORTACATH PLACEMENT Left 10/12/2021   Procedure: INSERTION PORT-A-CATH;  Surgeon: Aviva Signs, MD;  Location: [facility] ORS;  Service: General;  Laterality: Left;   VIDEO BRONCHOSCOPY WITH RADIAL ENDOBRONCHIAL ULTRASOUND  01/23/2022   Procedure: VIDEO BRONCHOSCOPY WITH RADIAL ENDOBRONCHIAL ULTRASOUND;  Surgeon: Collene Gobble, MD;  Location: [facility] ENDOSCOPY;  Service: Pulmonary;;   Social History:  reports that he has been smoking cigarettes. He has been smoking an average of .5 packs per day. He has never used smokeless  tobacco. He reports that he does not currently use alcohol after a past usage of about 14.0 standard drinks of alcohol per week. He reports that he does not use drugs.  Allergies  Allergen Reactions   Pork-Derived Products Other (See Comments)    Patient does not eat pork due to his religious belief     Family History  Problem Relation Age of Onset   Cancer Father        prostate   Cancer Sister    Colon cancer Neg Hx    Esophageal cancer Neg Hx     Prior to Admission medications   Medication Sig Start Date End Date Taking? Authorizing Provider  albuterol (VENTOLIN HFA) 108 (90 Base) MCG/ACT inhaler Inhale 1-2 puffs into the lungs every 6 (six) hours as needed for wheezing or shortness of breath. 01/04/22  Yes Pennington, Rebekah M, PA-C  allopurinol (ZYLOPRIM) 300 MG tablet Take 1 tablet (300 mg total) by mouth daily. 08/04/21  Yes Sanjuana Kava, MD  amLODipine (NORVASC) 5 MG tablet Take 1 tablet (5 mg total) by mouth daily. 11/28/21  Yes [name], [name]  amoxicillin-clavulanate (AUGMENTIN) 400-57 MG/5ML suspension Take 10 mLs (800 mg total) by mouth 2 (two) times daily for 28 days. (Medication expires in 10 days. Will need go get 2 refills.) 01/25/22 02/22/22 Yes Danford, Suann Larry, MD  fluorouracil CALGB 08676 2,400 mg/m2 in sodium chloride 0.9 % 150 mL Inject 2,400 mg/m2 into the vein over 48 hr. Every 2 weeks   Yes [provider]  FLUOROURACIL IV Inject into the vein every 14 (fourteen) days.   Yes [provider]  ibuprofen (ADVIL) 200 MG tablet Take 200 mg by mouth every 6 (six) hours as needed for moderate pain.   Yes [provider]  KLOR-CON M20 20 MEQ tablet TAKE 2 TABLETS BY MOUTH DAILY 12/19/21  Yes Derek Jack, MD  LEUCOVORIN CALCIUM IV Inject into the vein every 14 (fourteen) days.   Yes [provider]  magnesium oxide (MAG-OX) 400 (240 Mg) MG tablet Take 1 tablet (400 mg total) by mouth 2 (two) times daily. Patient taking differently: Take 1 tablet by mouth daily. 01/30/22 04/30/22 Yes [name], [name]  megestrol (MEGACE) 400 MG/10ML suspension Take 10 mLs (400 mg total) by mouth 2 (two) times daily. 12/07/21  Yes Derek Jack, MD  Nutritional Supplements (FEEDING SUPPLEMENT, OSMOLITE 1.5 CAL,) LIQD Place 237 mLs  into feeding tube 5 (five) times daily. 01/25/22  Yes Danford, Suann Larry, MD  OXALIPLATIN IV Inject into the vein every 14 (fourteen) days.   Yes [provider]  prochlorperazine (COMPAZINE) 10 MG tablet Take 1 tablet (10 mg total) by mouth every 6 (six) hours as needed for nausea or vomiting. 10/27/21  Yes Derek Jack, MD  Vitamin D, Ergocalciferol, (DRISDOL) 1.25 MG (50000 UNIT) CAPS capsule Take 1 capsule (50,000 Units total) by mouth every Monday. 11/28/21  Yes [name], [name]  loperamide (IMODIUM) 2 MG capsule Take 1 capsule (2 mg total) by mouth as needed for diarrhea or loose stools (take as directed according to the instruction sheet you were given). Patient not taking: Reported on 02/14/2022 10/04/21   Derek Jack, MD  Water For Irrigation, Sterile (FREE WATER) SOLN Place 120 mLs into feeding tube 6 (six) times daily. Patient not taking: Reported on 02/14/2022 01/25/22   Edwin Dada, MD    Physical Exam: Vitals:   02/14/22 1800 02/14/22 1812 02/14/22 1815 02/14/22 1915  BP: 101/83  Pulse:      Resp: (!) 24 (!) 22 15   Temp:    97.7 F (36.5 C)  TempSrc:    Oral  SpO2:       Constitutional: NAD, severely cachectic appearing male with temporal wasting laying in bed with nausea and vomiting Eyes:  lids and conjunctivae normal ENMT: Mucous membranes are dry Neck: normal, supple Respiratory: clear to auscultation bilaterally, no wheezing, no crackles. Normal respiratory effort. No accessory muscle use.  Cardiovascular: Regular rate and rhythm, no murmurs / rubs / gallops. No extremity edema.  Abdomen: Soft, nondistended, mild tenderness around PEG tube. Bowel sounds positive.  No erythema or discharge around PEG tube to suggest infection. Musculoskeletal: no clubbing / cyanosis.  Severe muscle wasting on all 4 extremities.   Skin: no rashes, lesions, ulcers. No induration Neurologic: CN 2-12 grossly intact.  Strength 5/5 in all 4.   Psychiatric: Normal judgment and insight. Alert and oriented x 3. Normal mood. Data Reviewed:  See HPI  Assessment and Plan: * Hypotension Soft BP around 90/64 due to dehydration -Continue IV fluid hydration overnight -Hold home antihypertensive  Cavitary pneumonia/?? Lung Abscess Previous hospitalization with questionable cavitary pneumonia/lung abscess and underwent bronchoscopy on 10/2.  Has been placed on 4 weeks of Augmentin per pulmonology. -Continue Augmentin  Stage IV B Squamous cell esophageal cancer (HCC) -Follows Dr. Del[facility] at [facility]  - previously on FOLFOX +Nivolumab -completed 6 cycles on 01/04/2022 but on hold due to continual weight loss -takes osmolite through PEG but continues to take solid by mouth  PEG (percutaneous endoscopic gastrostomy) status (HCC) Continue osmolite 5x daily  Nausea & vomiting Normal LFTs and total bilirubin. Benign abdominal exam. Suspect likely due to his esophageal malignancy and continued failure to thrive -As needed antiemetics -Continuous IV fluid hydration -place on soft food diet  Hypoglycemia Had CBG in the 60s.  Check CBG q4hr -okay to continue with juice/p.o intake  Metabolic acidosis Continue IV fluid overnight  AKI (acute kidney injury) ([place]) Creatinine elevated 1.24 from prior of 0.65 due to dehydration - Continue IV fluid overnight  Protein-calorie malnutrition, severe Patient severely cachectic on exam.  Chemotherapy currently on hold due to continual weight loss. -Continue Osmolite through PEG tube  Hypokalemia K of 3.2.  Give IV potassium 62mq x2.  -continue daily IV K solution       Advance Care Planning:   Code Status: Full Code   Consults: None  Family Communication: None at bedside  Severity of Illness: The appropriate patient status for this patient is OBSERVATION. Observation status is judged to be reasonable and necessary in order to provide the required intensity of service  to ensure the patient's safety. The patient's presenting symptoms, physical exam findings, and initial radiographic and laboratory data in the context of their medical condition is felt to place them at decreased risk for further clinical deterioration. Furthermore, it is anticipated that the patient will be medically stable for discharge from the hospital within 2 midnights of admission.   Author: COrene Desanctis DO 02/14/2022 9:15 PM  For on call review [email or web address]

## 2022-02-14 NOTE — Telephone Encounter (Signed)
I recommend that the patient go to the ED for evaluation if he has any head injury, loss of consciousness, severe headaches, or memory loss.

## 2022-02-14 NOTE — ED Provider Triage Note (Signed)
Emergency Medicine Provider Triage Evaluation Note  Trevor [name] , a 51 y.o. male  was evaluated in triage.  Pt complains of chest pain in the last 3 days.  Pain is located on the central chest, sharp, intermittent, nonradiating.  Recently diagnosed with esophageal cancer.  States he has had significant weight loss in June.  Endorses weakness and shortness of breath.  Last bowel movement was 7 days ago.  Endorses nausea and vomiting.  Denies constipation, diarrhea, rash, fever.  CBG 64.  Review of Systems  Positive: As above Negative: As above  Physical Exam  BP 94/64 (BP Location: Right Arm)   Pulse (!) 139   Resp 20   SpO2 100%  Gen:   Awake, no distress, tachycardia Resp:  Normal effort  MSK:   Moves extremities without difficulty  Other:    Medical Decision Making  Medically screening exam initiated at 11:51 AM.  Appropriate orders placed.  Trevor [name] was informed that the remainder of the evaluation will be completed by another provider, this initial triage assessment does not replace that evaluation, and the importance of remaining in the ED until their evaluation is complete.  Work-up initiated.   Trevor [name], [place] 02/14/22 1154

## 2022-02-14 NOTE — ED Triage Notes (Signed)
Pt complains of chest pain that started two days ago that goes into his right arm. Pt feels weak all over, also states he has had a cough and spitting up clear mucous. Pt complains that his vision became blurry 2 days ago as well. Denies headache. Pt feels dizzy, and passed out 3 times this morning per family. Pt also states his bilateral feet feel numb. Denies numbness in arms.

## 2022-02-14 NOTE — Assessment & Plan Note (Addendum)
Normal LFTs and total bilirubin. Benign abdominal exam. Suspect likely due to his esophageal malignancy and continued failure to thrive -As needed antiemetics -Continuous IV fluid hydration -place on soft food diet

## 2022-02-14 NOTE — ED Notes (Signed)
CBG 64 notified PA

## 2022-02-14 NOTE — ED Provider Notes (Signed)
[place] EMERGENCY DEPARTMENT Provider Note   CSN: [id number] Arrival date & time: 02/14/22  1102     History  Chief Complaint  Patient presents with   Weakness   Chest Pain    Trevor [name] is a 51 y.o. 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 Pt complains of chest pain in the last 3 days.  Pain is located on the central chest, sharp, intermittent, nonradiating.  Recently diagnosed with esophageal cancer.  States he has had significant weight loss since June.  Endorses weakness and shortness of breath.  States after the second chemo treatment patient started to feel weak and has difficulty with walking around the house.  Last bowel movement was 7 days ago.  He has had nausea and vomiting since chemo.  Denies constipation, diarrhea, rash, fever.   Weakness Associated symptoms: chest pain   Chest Pain Associated symptoms: weakness        Home Medications Prior to Admission medications   Medication Sig Start Date End Date Taking? Authorizing Provider  albuterol (VENTOLIN HFA) 108 (90 Base) MCG/ACT inhaler Inhale 1-2 puffs into the lungs every 6 (six) hours as needed for wheezing or shortness of breath. 01/04/22   Harriett Rush, PA-C  allopurinol (ZYLOPRIM) 300 MG tablet Take 1 tablet (300 mg total) by mouth daily. 08/04/21   Sanjuana Kava, MD  amLODipine (NORVASC) 5 MG tablet Take 1 tablet (5 mg total) by mouth daily. 11/28/21   [name], [name]  amoxicillin-clavulanate (AUGMENTIN) 400-57 MG/5ML suspension Take 10 mLs (800 mg total) by mouth 2 (two) times daily for 28 days. (Medication expires in 10 days. Will need go get 2 refills.) 01/25/22 02/22/22  Edwin Dada, MD  fluorouracil CALGB 09735 2,400 mg/m2 in sodium chloride 0.9 % 150 mL Inject 2,400 mg/m2 into the vein over 48 hr. Every 2 weeks     [provider]  FLUOROURACIL IV Inject into the vein every 14 (fourteen) days.    [provider]  KLOR-CON M20 20 MEQ tablet TAKE 2 TABLETS BY MOUTH DAILY 12/19/21   Derek Jack, MD  LEUCOVORIN CALCIUM IV Inject into the vein every 14 (fourteen) days.    [provider]  loperamide (IMODIUM) 2 MG capsule Take 1 capsule (2 mg total) by mouth as needed for diarrhea or loose stools (take as directed according to the instruction sheet you were given). 10/04/21   Derek Jack, MD  magnesium oxide (MAG-OX) 400 (240 Mg) MG tablet Take 1 tablet (400 mg total) by mouth 2 (two) times daily. 01/30/22 04/30/22  [name], [name]  megestrol (MEGACE) 400 MG/10ML suspension Take 10 mLs (400 mg total) by mouth 2 (two) times daily. 12/07/21   Derek Jack, MD  Nutritional Supplements (FEEDING SUPPLEMENT, OSMOLITE 1.5 CAL,) LIQD Place 237 mLs into feeding tube 5 (five) times daily. 01/25/22   Danford, Suann Larry, MD  OXALIPLATIN IV Inject into the vein every 14 (fourteen) days.    [provider]  prochlorperazine (COMPAZINE) 10 MG tablet Take 1 tablet (10 mg total) by mouth every 6 (six) hours as needed for nausea or vomiting. 10/27/21   Derek Jack, MD  Vitamin D, Ergocalciferol, (DRISDOL) 1.25 MG (50000 UNIT) CAPS capsule Take 1 capsule (50,000 Units total) by mouth every Monday. 11/28/21   [name], [name]  Water For Irrigation, Sterile (FREE WATER) SOLN Place 120  mLs into feeding tube 6 (six) times daily. 01/25/22   Danford, Suann Larry, MD      Allergies    Pork-derived products    Review of Systems   Review of Systems  Cardiovascular:  Positive for chest pain.  Neurological:  Positive for weakness.    Physical Exam Updated Vital Signs BP 107/83   Pulse (!) 139   Temp (S) (!) 95 F (35 C) (Oral) Comment: warm blankets applied  Resp 16   SpO2 100%  Physical Exam Vitals and nursing note reviewed.  Constitutional:       Appearance: Normal appearance. He is ill-appearing.  HENT:     Head: Normocephalic and atraumatic.     Mouth/Throat:     Mouth: Mucous membranes are moist.  Eyes:     General: No scleral icterus. Cardiovascular:     Rate and Rhythm: Normal rate and regular rhythm.     Pulses: Normal pulses.     Heart sounds: Normal heart sounds.  Pulmonary:     Effort: Pulmonary effort is normal.     Breath sounds: Normal breath sounds.  Abdominal:     General: Abdomen is flat.     Palpations: Abdomen is soft.     Tenderness: There is no abdominal tenderness.  Musculoskeletal:        General: No deformity.  Skin:    General: Skin is warm.     Findings: No rash.  Neurological:     General: No focal deficit present.     Mental Status: He is alert.  Psychiatric:        Mood and Affect: Mood normal.     ED Results / Procedures / Treatments   Labs (all labs ordered are listed, but only abnormal results are displayed) Labs Reviewed  CBC - Abnormal; Notable for the following components:      Result Value   WBC 12.3 (*)    Hemoglobin 11.9 (*)    HCT 36.6 (*)    MCV 78.9 (*)    MCH 25.6 (*)    RDW 22.4 (*)    All other components within normal limits  COMPREHENSIVE METABOLIC PANEL - Abnormal; Notable for the following components:   Potassium 3.2 (*)    CO2 20 (*)    BUN 33 (*)    Albumin 3.1 (*)    Anion gap 19 (*)    All other components within normal limits  CBG MONITORING, ED - Abnormal; Notable for the following components:   Glucose-Capillary 64 (*)    All other components within normal limits  TROPONIN I (HIGH SENSITIVITY) - Abnormal; Notable for the following components:   Troponin I (High Sensitivity) 37 (*)    All other components within normal limits  URINALYSIS, ROUTINE W REFLEX MICROSCOPIC  CBG MONITORING, ED  CBG MONITORING, ED  CBG MONITORING, ED  TROPONIN I (HIGH SENSITIVITY)    EKG None  Radiology DG Chest 2 View  Result Date: 02/14/2022 CLINICAL DATA:   Chest pain EXAM: CHEST - 2 VIEW COMPARISON:  CT 01/31/2022 FINDINGS: Left chest wall port a catheter is noted with tip in the distal SVC. Heart size appears normal. No pleural effusion or edema. No airspace opacities identified. Unchanged remote healed rib fractures. Remote inferior endplate deformity involving the T7 vertebra appears stable. IMPRESSION: No acute cardiopulmonary abnormalities. Electronically Signed   By: Kerby Moors M.D.   On: 02/14/2022 12:16    Procedures Procedures    Medications Ordered in ED Medications  sodium chloride flush (NS) 0.9 % injection 3 mL (3 mLs Intravenous Not Given 02/14/22 1327)  sodium chloride flush (NS) 0.9 % injection 3 mL (has no administration in time range)  0.9 %  sodium chloride infusion (has no administration in time range)  dextrose 10 % infusion (100 mLs Intravenous New Bag/Given 02/14/22 1326)  ondansetron (ZOFRAN-ODT) disintegrating tablet 4 mg (4 mg Oral Given 02/14/22 1323)    ED Course/ Medical Decision Making/ A&P Clinical Course as of 02/14/22 [place] Feb 14, 2022  1238 Glucose-Capillary(!): 64 [JL]  1328 TempMarland Kitchen)(S): 95 F (35 C) [KL]    Clinical Course User Index [JL] Regan Lemming, MD [KL] [name], PA                           Medical Decision Making Amount and/or Complexity of Data Reviewed Labs: ordered. Decision-making details documented in ED Course. Radiology: ordered.  Risk Prescription drug management. Decision regarding hospitalization.   This patient presents to the ED for concern of chest pain, weakness, vomiting, this involves an extensive number of treatment options, and is a complaint that carries with it a high risk of complications and morbidity.  The differential diagnosis includes chemo-induced vomiting, esophageal cancer, ACS, pneumothorax, pneumonia, dissection, PE, sepsis. Co morbidities that complicate the patient evaluation  See HPI Additional history obtained:  Additional history  obtained from EMR External records from outside source obtained and reviewed including Care Everywhere/External Records and Primary Care Documents Lab Tests:  I Ordered, and personally interpreted labs.  The pertinent results include:   Leukocytosis with white count of 12.3.   No evidence of anemia.  Platelets within normal range.   Hypokalemia at 3.2.  BUN 33. Creatinine normal.  No transaminitis noted.  Lipase within normal limits.   UA significant for no acute abnormalities. Imaging Studies ordered:  I ordered imaging studies including: Chest x-ray which showed no acute cardiopulmonary abnormality.  I independently visualized and interpreted imaging. I agree with the radiologist interpretation Cardiac Monitoring: / EKG:  The patient was maintained on a cardiac monitor.  I personally viewed and interpreted the cardiac monitored which showed an underlying rhythm of: sinus rhythm Consultations Obtained:  I requested consultation with the hospitalist Dr. Flossie Buffy,  and discussed lab and imaging findings as well as pertinent plan - they recommend: admission. Problem List / ED Course / Critical interventions / Medication management  Chest pain, nausea, vomiting, weakness Vitals signs significant for heart rate of 139. Otherwise within normal range and stable throughout visit Laboratory/imaging studies significant for: See above On physical examination, patient is afebrile and appears in no acute distress.  He is tachycardic.  Heart sounds normal.  Lungs are clear.  Abdomen is soft.  He does have tenderness to palpation to his abdomen.  CBG in triage is 64.  I ordered Zofran for nausea.  Offer food and orange juice for low sugar.  I ordered dextrose. 2:04 PM Reassessed patient.patient states he is still feeling nausea and weak.  He has been given a dose of Zofran and getting dextrose infusion. 4:32 PM Reassessed patient. Patient states he continues to have chest pain and shortness of breath. I  reordered an EKG and CTA to rule out PE. Patient's presentations are most concerned for chemotherapy induced nausea, esophageal cancer.  I ordered medication including dextrose 10% infusion, Zofran ODT, normal saline. Reevaluation of the patient after these medicines showed that the patient stayed the same I have  reviewed the patients home medicines and have made adjustments as needed Social Determinants of Health:  N/A Test / Admission / Dispo - Considered:  Pt is admitted to the hospital for further management.        Final Clinical Impression(s) / ED Diagnoses Final diagnoses:  None    Rx / DC Orders ED Discharge Orders     None         [name], [place] 02/14/22 2138    Regan Lemming, MD 02/15/22 [phone]

## 2022-02-14 NOTE — Assessment & Plan Note (Addendum)
Previous hospitalization with questionable cavitary pneumonia/lung abscess and underwent bronchoscopy on 10/2.  Has been placed on 4 weeks of Augmentin per pulmonology. -Continue Augmentin

## 2022-02-14 NOTE — ED Notes (Signed)
Patient transported to CT 

## 2022-02-14 NOTE — Assessment & Plan Note (Signed)
Continue osmolite 5x daily

## 2022-02-14 NOTE — Assessment & Plan Note (Signed)
Soft BP around 90/64 due to dehydration -Continue IV fluid hydration overnight -Hold home antihypertensive

## 2022-02-14 NOTE — Telephone Encounter (Signed)
Left a vm for pt asking for a return call.

## 2022-02-14 NOTE — Assessment & Plan Note (Signed)
Creatinine elevated 1.24 from prior of 0.65 due to dehydration - Continue IV fluid overnight

## 2022-02-14 NOTE — Assessment & Plan Note (Signed)
Patient severely cachectic on exam.  Chemotherapy currently on hold due to continual weight loss. -Continue Osmolite through PEG tube

## 2022-02-14 NOTE — Assessment & Plan Note (Signed)
Continue IV fluid overnight

## 2022-02-14 NOTE — Assessment & Plan Note (Signed)
K of 3.2.  Give IV potassium 50mq x2.  -continue daily IV K solution

## 2022-02-14 NOTE — Assessment & Plan Note (Signed)
Had CBG in the 60s.  Check CBG q4hr -okay to continue with juice/p.o intake

## 2022-02-15 ENCOUNTER — Other Ambulatory Visit: Payer: 59

## 2022-02-15 ENCOUNTER — Ambulatory Visit: Payer: 59 | Admitting: Hematology

## 2022-02-15 ENCOUNTER — Ambulatory Visit: Payer: 59

## 2022-02-15 DIAGNOSIS — R112 Nausea with vomiting, unspecified: Secondary | ICD-10-CM

## 2022-02-15 DIAGNOSIS — R531 Weakness: Secondary | ICD-10-CM | POA: Diagnosis not present

## 2022-02-15 DIAGNOSIS — J984 Other disorders of lung: Secondary | ICD-10-CM | POA: Diagnosis not present

## 2022-02-15 DIAGNOSIS — N179 Acute kidney failure, unspecified: Secondary | ICD-10-CM | POA: Diagnosis not present

## 2022-02-15 DIAGNOSIS — E162 Hypoglycemia, unspecified: Secondary | ICD-10-CM | POA: Diagnosis not present

## 2022-02-15 DIAGNOSIS — Z931 Gastrostomy status: Secondary | ICD-10-CM | POA: Diagnosis not present

## 2022-02-15 DIAGNOSIS — Z681 Body mass index (BMI) 19 or less, adult: Secondary | ICD-10-CM | POA: Diagnosis not present

## 2022-02-15 DIAGNOSIS — E43 Unspecified severe protein-calorie malnutrition: Secondary | ICD-10-CM | POA: Diagnosis not present

## 2022-02-15 DIAGNOSIS — G621 Alcoholic polyneuropathy: Secondary | ICD-10-CM | POA: Diagnosis not present

## 2022-02-15 DIAGNOSIS — K219 Gastro-esophageal reflux disease without esophagitis: Secondary | ICD-10-CM | POA: Diagnosis not present

## 2022-02-15 DIAGNOSIS — I10 Essential (primary) hypertension: Secondary | ICD-10-CM | POA: Diagnosis not present

## 2022-02-15 DIAGNOSIS — Z515 Encounter for palliative care: Secondary | ICD-10-CM | POA: Diagnosis not present

## 2022-02-15 DIAGNOSIS — R634 Abnormal weight loss: Secondary | ICD-10-CM | POA: Diagnosis not present

## 2022-02-15 DIAGNOSIS — I951 Orthostatic hypotension: Secondary | ICD-10-CM | POA: Diagnosis not present

## 2022-02-15 DIAGNOSIS — Z7189 Other specified counseling: Secondary | ICD-10-CM | POA: Diagnosis not present

## 2022-02-15 DIAGNOSIS — E11649 Type 2 diabetes mellitus with hypoglycemia without coma: Secondary | ICD-10-CM | POA: Diagnosis not present

## 2022-02-15 DIAGNOSIS — E876 Hypokalemia: Secondary | ICD-10-CM | POA: Diagnosis not present

## 2022-02-15 DIAGNOSIS — F17210 Nicotine dependence, cigarettes, uncomplicated: Secondary | ICD-10-CM | POA: Diagnosis not present

## 2022-02-15 DIAGNOSIS — Z79899 Other long term (current) drug therapy: Secondary | ICD-10-CM | POA: Diagnosis not present

## 2022-02-15 DIAGNOSIS — C159 Malignant neoplasm of esophagus, unspecified: Secondary | ICD-10-CM | POA: Diagnosis not present

## 2022-02-15 DIAGNOSIS — J851 Abscess of lung with pneumonia: Secondary | ICD-10-CM | POA: Diagnosis not present

## 2022-02-15 DIAGNOSIS — E872 Acidosis, unspecified: Secondary | ICD-10-CM | POA: Diagnosis not present

## 2022-02-15 DIAGNOSIS — I9589 Other hypotension: Secondary | ICD-10-CM | POA: Diagnosis not present

## 2022-02-15 DIAGNOSIS — E860 Dehydration: Secondary | ICD-10-CM | POA: Diagnosis not present

## 2022-02-15 DIAGNOSIS — R1114 Bilious vomiting: Secondary | ICD-10-CM | POA: Diagnosis not present

## 2022-02-15 DIAGNOSIS — C799 Secondary malignant neoplasm of unspecified site: Secondary | ICD-10-CM | POA: Diagnosis not present

## 2022-02-15 DIAGNOSIS — R627 Adult failure to thrive: Secondary | ICD-10-CM

## 2022-02-15 DIAGNOSIS — D509 Iron deficiency anemia, unspecified: Secondary | ICD-10-CM | POA: Diagnosis not present

## 2022-02-15 DIAGNOSIS — J189 Pneumonia, unspecified organism: Secondary | ICD-10-CM | POA: Diagnosis not present

## 2022-02-15 DIAGNOSIS — Z20822 Contact with and (suspected) exposure to covid-19: Secondary | ICD-10-CM | POA: Diagnosis not present

## 2022-02-15 LAB — CBC
HCT: 27.5 % — ABNORMAL LOW (ref 39.0–52.0)
Hemoglobin: 9.2 g/dL — ABNORMAL LOW (ref 13.0–17.0)
MCH: 26.4 pg (ref 26.0–34.0)
MCHC: 33.5 g/dL (ref 30.0–36.0)
MCV: 79 fL — ABNORMAL LOW (ref 80.0–100.0)
Platelets: 219 10*3/uL (ref 150–400)
RBC: 3.48 MIL/uL — ABNORMAL LOW (ref 4.22–5.81)
RDW: 22.2 % — ABNORMAL HIGH (ref 11.5–15.5)
WBC: 10.8 10*3/uL — ABNORMAL HIGH (ref 4.0–10.5)
nRBC: 0 % (ref 0.0–0.2)

## 2022-02-15 LAB — CBG MONITORING, ED
Glucose-Capillary: 107 mg/dL — ABNORMAL HIGH (ref 70–99)
Glucose-Capillary: 90 mg/dL (ref 70–99)
Glucose-Capillary: 161 mg/dL — ABNORMAL HIGH (ref 70–99)
Glucose-Capillary: 91 mg/dL (ref 70–99)

## 2022-02-15 LAB — BASIC METABOLIC PANEL
Anion gap: 7 (ref 5–15)
Anion gap: 8 (ref 5–15)
BUN: 29 mg/dL — ABNORMAL HIGH (ref 6–20)
BUN: 29 mg/dL — ABNORMAL HIGH (ref 6–20)
CO2: 22 mmol/L (ref 22–32)
CO2: 22 mmol/L (ref 22–32)
Calcium: 8.3 mg/dL — ABNORMAL LOW (ref 8.9–10.3)
Calcium: 9.5 mg/dL (ref 8.9–10.3)
Chloride: 110 mmol/L (ref 98–111)
Chloride: 108 mmol/L (ref 98–111)
Creatinine, Ser: 0.98 mg/dL (ref 0.61–1.24)
Creatinine, Ser: 1.05 mg/dL (ref 0.61–1.24)
GFR, Estimated: >60 mL/min (ref >60)
GFR, Estimated: >60 mL/min (ref >60)
Glucose, Bld: 101 mg/dL — ABNORMAL HIGH (ref 70–99)
Glucose, Bld: 89 mg/dL (ref 70–99)
Potassium: 2.6 mmol/L — CL (ref 3.5–5.1)
Potassium: 4.6 mmol/L (ref 3.5–5.1)
Sodium: 139 mmol/L (ref 135–145)
Sodium: 138 mmol/L (ref 135–145)

## 2022-02-15 LAB — MAGNESIUM: Magnesium: 1.8 mg/dL (ref 1.7–2.4)

## 2022-02-15 LAB — GLUCOSE, CAPILLARY: Glucose-Capillary: 93 mg/dL (ref 70–99)

## 2022-02-15 MED ORDER — POTASSIUM CHLORIDE 20 MEQ PO PACK
40.0000 meq | PACK | Freq: Once | ORAL | Status: AC
  Administered 2022-02-15: 40 meq
  Filled 2022-02-15: qty 2

## 2022-02-15 MED ORDER — POTASSIUM CHLORIDE 10 MEQ/100ML IV SOLN
10.0000 meq | INTRAVENOUS | Status: AC
  Administered 2022-02-15 (×5): 10 meq via INTRAVENOUS
  Filled 2022-02-15 (×5): qty 100

## 2022-02-15 MED ORDER — POTASSIUM CHLORIDE 10 MEQ/100ML IV SOLN
10.0000 meq | Freq: Once | INTRAVENOUS | Status: AC
  Administered 2022-02-15: 10 meq via INTRAVENOUS
  Filled 2022-02-15: qty 100

## 2022-02-15 MED ORDER — LACTATED RINGERS IV SOLN: INTRAVENOUS | Status: DC

## 2022-02-15 MED ORDER — GUAIFENESIN ER 600 MG PO TB12
600.0000 mg | ORAL_TABLET | Freq: Two times a day (BID) | ORAL | Status: DC
  Administered 2022-02-15 (×2): 600 mg via ORAL
  Filled 2022-02-15 (×3): qty 1

## 2022-02-15 MED ORDER — GABAPENTIN 100 MG PO CAPS
100.0000 mg | ORAL_CAPSULE | Freq: Every day | ORAL | Status: DC
  Administered 2022-02-15: 100 mg via ORAL
  Filled 2022-02-15: qty 1

## 2022-02-15 NOTE — Progress Notes (Signed)
NEW ADMISSION NOTE New Admission Note:   Arrival Method: stretcher Mental Orientation:A&OX4 Telemetry: 5M01 Assessment: Completed Skin:intact dry flaky, bilateral feet cracking, bilateral arms and legs abraisons IV:RFA Pain:0/10 Tubes:peg tube LUQ Safety Measures: Safety Fall Prevention Plan has been given, discussed and signed Admission: Completed [facility] Orientation: Patient has been orientated to the room, unit and staff.  Family:none   Orders have been reviewed and implemented. Will continue to monitor the patient. Call light has been placed within reach and bed alarm has been activated.   Junella Domke S Jereme Loren, RN

## 2022-02-15 NOTE — Telephone Encounter (Signed)
Lvm informing pt of Trevor [name] message.

## 2022-02-15 NOTE — Progress Notes (Signed)
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    Subjective:   Multiple complaints  Feet are numb and falling, three times before coming to the hospital  Coughing  yellow green mucus,for two wks, Feel sob when walk to the kitchen for a week, no chest pain, no fecver, states his oncologist advised him to come to the hospital due to feeling sob  Feeling vision is blurry for 5 days, no eye pain  States ensure makes him having diarrhea, he is upset about the soft diet, he wants regular diet   Assessment & Plan:  Principal Problem:   Hypotension Active Problems:   Stage IV B Squamous cell esophageal cancer (HCC)   Cavitary pneumonia/?? Lung Abscess   Hypokalemia   Protein-calorie malnutrition, severe   AKI (acute kidney injury) (HCC)   Metabolic acidosis   Hypoglycemia   Nausea & vomiting   PEG (percutaneous endoscopic gastrostomy) status (HCC)   FTT (failure to thrive) in adult    Assessment and Plan:   Nausea & vomiting Normal LFTs and total bilirubin. Benign abdominal exam. Suspect likely due to his esophageal malignancy and continued failure to thrive -As needed antiemetics -Continuous IV fluid hydration -place on soft food diet  Hypokalemia Hypoglycemia, CBG in the 60s * Hypotension Azotemia/metabolic acidosis/mild elevated cr at 1.24, baseline 0.6 to 0.9 Soft BP around 90/64 due to dehydration -Continue IV fluid hydration overnight -Hold home antihypertensive  Cavitary pneumonia/?? Lung Abscess Previous hospitalization with questionable cavitary pneumonia/lung abscess and underwent bronchoscopy on 10/2.  Has been placed on 4 weeks of Augmentin per pulmonology. -Continue Augmentin  Stage  IV B Squamous cell esophageal cancer (HCC) -Follows Dr. Delton Coomb[facility] at [facility]  - previously on FOLFOX +Nivolumab -completed 6 cycles on 01/04/2022 but on hold due to continual weight loss -takes osmolite through PEG but continues to take solid by mouth  PEG (percutaneous endoscopic gastrostomy) status (HCC) Continue osmolite 5x daily  Peripheral neuropathy Appear has h/o alcohol use and peripheral neuropathy symptoms proceeds chemotherapy Start neurontin   Protein-calorie malnutrition, severe Patient severely cachectic on exam.  Chemotherapy currently on hold due to continual weight loss. -Continue Osmolite through PEG tube  FTT/ poor prognosis, palliative care consulted for goals of care discussion     I have Reviewed nursing notes, Vitals, pain scores, I/o's, Lab results and  imaging results since pt's last encounter, details please see discussion above  I ordered the following labs:  Unresulted Labs (From admission, onward)     Start     Ordered   02/16/22 8546  Basic metabolic panel  Tomorrow morning,   R        02/15/22 0656   02/16/22 0500  Phosphorus  Tomorrow morning,   R        02/15/22 0656   02/16/22 0500  CBC with Differential/Platelet  Tomorrow morning,   R        02/15/22 0656   02/16/22 0500  Magnesium  Tomorrow morning,   R        02/15/22 1845   02/15/22 0900  Expectorated Sputum Assessment w Gram Stain, Rflx to Resp Cult  Once,   R        02/15/22 0859   02/14/22 1150  Urinalysis,  Routine w reflex microscopic Urine, Clean Catch  Once,   URGENT        02/14/22 1150             DVT prophylaxis: enoxaparin (LOVENOX) injection 30 mg Start: 02/14/22 2100   Code Status:   Code Status: Full Code  Family Communication: none at bedside  Disposition:    Dispo: The patient is from: home              Anticipated d/c is to: home              Anticipated d/c date is: >24hrs  Antimicrobials:    Anti-infectives (From admission, onward)     Start     Dose/Rate Route Frequency Ordered Stop   02/14/22 2330  amoxicillin-clavulanate (AUGMENTIN) 400-57 MG/5ML suspension 800 mg        800 mg of amoxicillin Per Tube 2 times daily 02/14/22 2058 04/09/22 0959          Objective: Vitals:   02/15/22 1651 02/15/22 1700 02/15/22 1753 02/15/22 2039  BP: 120/83 118/85 108/72 100/71  Pulse: 66  (!) 59 61  Resp: '18  18 18  '$ Temp: 97.8 F (36.6 C)  (!) 97.4 F (36.3 C) 97.7 F (36.5 C)  TempSrc: Oral  Oral Oral  SpO2: 96%  100% 99%    Intake/Output Summary (Last 24 hours) at 02/15/2022 2202 Last data filed at 02/15/2022 1809 Gross per 24 hour  Intake 989 ml  Output --  Net 989 ml   There were no vitals filed for this visit.  Examination:  General exam: alert, awake, communicative,calm, NAD Respiratory system: Clear to auscultation. Respiratory effort normal. Cardiovascular system:  RRR.  Gastrointestinal system: Abdomen is nondistended, soft and nontender.  Normal bowel sounds heard. Central nervous system: Alert and oriented. No focal neurological deficits. Extremities:  no edema Skin: No rashes, lesions or ulcers Psychiatry: Judgement and insight appear normal. Mood & affect appropriate.     Data Reviewed: I have personally reviewed  labs and visualized  imaging studies since the last encounter and formulate the plan        Scheduled Meds:  allopurinol  300 mg Per Tube Daily   amoxicillin-clavulanate  800 mg of amoxicillin Per Tube BID   enoxaparin (LOVENOX) injection  30 mg Subcutaneous Q24H   feeding supplement (OSMOLITE 1.5 CAL)  237 mL Per Tube 5 X Daily   free water  120 mL Per Tube 6 X Daily   gabapentin  100 mg Oral QHS   guaiFENesin  600 mg Oral BID   magnesium oxide  400 mg Per Tube Daily   megestrol  400 mg Per Tube BID   sodium chloride flush  3 mL Intravenous Q12H   [START ON 02/20/2022] Vitamin D (Ergocalciferol)  50,000 Units Oral Q Mon   Continuous Infusions:  sodium chloride      lactated ringers 75 mL/hr at 02/15/22 2101     LOS: 0 days     Florencia Reasons, MD PhD FACP [facility]  Available via Epic secure chat 7am-7pm for nonurgent issues Please page for urgent issues To page the attending provider between 7A-7P or the covering provider during after hours 7P-7A, please log into the web site [email or web address] and access using universal [place] password for that web site. If you do not have the password, please call the hospital operator.    02/15/2022, 10:02 PM

## 2022-02-15 NOTE — ED Notes (Signed)
Pt taken via transport to 5M01C with all of belongings.

## 2022-02-15 NOTE — ED Notes (Signed)
This tech tried to obtain an oral and an axillary temperature and was unsuccessful. RN notified.

## 2022-02-15 NOTE — ED Notes (Signed)
Pt ambulates to bathroom. Doesn't want to wear any monitoring at this time.

## 2022-02-16 DIAGNOSIS — E162 Hypoglycemia, unspecified: Secondary | ICD-10-CM | POA: Diagnosis not present

## 2022-02-16 DIAGNOSIS — I9589 Other hypotension: Secondary | ICD-10-CM | POA: Diagnosis not present

## 2022-02-16 DIAGNOSIS — R627 Adult failure to thrive: Secondary | ICD-10-CM | POA: Diagnosis not present

## 2022-02-16 DIAGNOSIS — R531 Weakness: Secondary | ICD-10-CM

## 2022-02-16 DIAGNOSIS — N179 Acute kidney failure, unspecified: Secondary | ICD-10-CM | POA: Diagnosis not present

## 2022-02-16 DIAGNOSIS — J189 Pneumonia, unspecified organism: Secondary | ICD-10-CM | POA: Diagnosis not present

## 2022-02-16 DIAGNOSIS — Z7189 Other specified counseling: Secondary | ICD-10-CM

## 2022-02-16 DIAGNOSIS — R1114 Bilious vomiting: Secondary | ICD-10-CM | POA: Diagnosis not present

## 2022-02-16 LAB — BASIC METABOLIC PANEL
Anion gap: 7 (ref 5–15)
BUN: 26 mg/dL — ABNORMAL HIGH (ref 6–20)
CO2: 22 mmol/L (ref 22–32)
Calcium: 9.3 mg/dL (ref 8.9–10.3)
Chloride: 109 mmol/L (ref 98–111)
Creatinine, Ser: 0.91 mg/dL (ref 0.61–1.24)
GFR, Estimated: >60 mL/min (ref >60)
Glucose, Bld: 79 mg/dL (ref 70–99)
Potassium: 4.2 mmol/L (ref 3.5–5.1)
Sodium: 138 mmol/L (ref 135–145)

## 2022-02-16 LAB — URINALYSIS, ROUTINE W REFLEX MICROSCOPIC
Glucose, UA: NEGATIVE mg/dL
Hgb urine dipstick: NEGATIVE
Ketones, ur: NEGATIVE mg/dL
Leukocytes,Ua: NEGATIVE
Nitrite: NEGATIVE
Protein, ur: 30 mg/dL — AB
Specific Gravity, Urine: >1.046 — ABNORMAL HIGH (ref 1.005–1.030)
pH: 5 (ref 5.0–8.0)

## 2022-02-16 LAB — CBC WITH DIFFERENTIAL/PLATELET
Abs Immature Granulocytes: 0.03 10*3/uL (ref 0.00–0.07)
Basophils Absolute: 0 10*3/uL (ref 0.0–0.1)
Basophils Relative: 0 %
Eosinophils Absolute: 0.1 10*3/uL (ref 0.0–0.5)
Eosinophils Relative: 2 %
HCT: 26.2 % — ABNORMAL LOW (ref 39.0–52.0)
Hemoglobin: 8.8 g/dL — ABNORMAL LOW (ref 13.0–17.0)
Immature Granulocytes: 0 %
Lymphocytes Relative: 21 %
Lymphs Abs: 1.6 10*3/uL (ref 0.7–4.0)
MCH: 26.4 pg (ref 26.0–34.0)
MCHC: 33.6 g/dL (ref 30.0–36.0)
MCV: 78.7 fL — ABNORMAL LOW (ref 80.0–100.0)
Monocytes Absolute: 0.6 10*3/uL (ref 0.1–1.0)
Monocytes Relative: 8 %
Neutro Abs: 5.2 10*3/uL (ref 1.7–7.7)
Neutrophils Relative %: 69 %
Platelets: 166 10*3/uL (ref 150–400)
RBC: 3.33 MIL/uL — ABNORMAL LOW (ref 4.22–5.81)
RDW: 22.4 % — ABNORMAL HIGH (ref 11.5–15.5)
WBC: 7.6 10*3/uL (ref 4.0–10.5)
nRBC: 0 % (ref 0.0–0.2)

## 2022-02-16 LAB — MAGNESIUM: Magnesium: 1.8 mg/dL (ref 1.7–2.4)

## 2022-02-16 LAB — PHOSPHORUS: Phosphorus: 1.8 mg/dL — ABNORMAL LOW (ref 2.5–4.6)

## 2022-02-16 MED ORDER — CHLORHEXIDINE GLUCONATE CLOTH 2 % EX PADS
6.0000 | MEDICATED_PAD | Freq: Every day | CUTANEOUS | Status: DC
  Administered 2022-02-17 – 2022-02-21 (×4): 6 via TOPICAL

## 2022-02-16 MED ORDER — GABAPENTIN 100 MG PO CAPS: 100.0000 mg | ORAL_CAPSULE | Freq: Two times a day (BID) | ORAL | Status: DC

## 2022-02-16 MED ORDER — OSMOLITE 1.5 CAL PO LIQD
237.0000 mL | Freq: Four times a day (QID) | ORAL | Status: DC
  Administered 2022-02-16: 237 mL
  Filled 2022-02-16: qty 237

## 2022-02-16 MED ORDER — SODIUM PHOSPHATES 45 MMOLE/15ML IV SOLN
15.0000 mmol | Freq: Once | INTRAVENOUS | Status: AC
  Administered 2022-02-16: 15 mmol via INTRAVENOUS
  Filled 2022-02-16: qty 5

## 2022-02-16 MED ORDER — BOOST / RESOURCE BREEZE PO LIQD CUSTOM: 1.0000 | Freq: Three times a day (TID) | ORAL | Status: DC

## 2022-02-16 MED ORDER — LACTATED RINGERS IV SOLN: INTRAVENOUS | Status: AC

## 2022-02-16 MED ORDER — BANATROL TF EN LIQD
60.0000 mL | Freq: Two times a day (BID) | ENTERAL | Status: DC
  Administered 2022-02-16 – 2022-02-17 (×3): 60 mL
  Filled 2022-02-16 (×4): qty 60

## 2022-02-16 MED ORDER — SODIUM CHLORIDE 0.9% FLUSH
10.0000 mL | Freq: Two times a day (BID) | INTRAVENOUS | Status: DC
  Administered 2022-02-17 (×2): 10 mL
  Administered 2022-02-18: 30 mL
  Administered 2022-02-18 – 2022-02-19 (×2): 10 mL
  Administered 2022-02-19 – 2022-02-20 (×2): 30 mL

## 2022-02-16 MED ORDER — ENSURE ENLIVE PO LIQD
237.0000 mL | Freq: Three times a day (TID) | ORAL | Status: DC
  Administered 2022-02-17 – 2022-02-21 (×14): 237 mL via ORAL

## 2022-02-16 MED ORDER — FREE WATER
100.0000 mL | Freq: Four times a day (QID) | Status: DC
  Administered 2022-02-16 – 2022-02-19 (×8): 100 mL

## 2022-02-16 MED ORDER — GUAIFENESIN 100 MG/5ML PO LIQD
10.0000 mL | Freq: Two times a day (BID) | ORAL | Status: DC
  Administered 2022-02-16 – 2022-02-21 (×12): 10 mL
  Filled 2022-02-16 (×12): qty 15

## 2022-02-16 MED ORDER — MAGNESIUM SULFATE IN D5W 1-5 GM/100ML-% IV SOLN
1.0000 g | Freq: Once | INTRAVENOUS | Status: AC
  Administered 2022-02-16: 1 g via INTRAVENOUS
  Filled 2022-02-16: qty 100

## 2022-02-16 MED ORDER — GABAPENTIN 250 MG/5ML PO SOLN
100.0000 mg | Freq: Two times a day (BID) | ORAL | Status: DC
  Administered 2022-02-16 – 2022-02-18 (×5): 100 mg
  Filled 2022-02-16 (×6): qty 2

## 2022-02-16 NOTE — Evaluation (Signed)
Physical Therapy Evaluation Patient Details Name: Trevor [name] MRN: [id number] DOB: 1971-04-18 Today's Date: 02/16/2022  History of Present Illness  51 y.o. male presents to [facility] hospital on 02/14/2022 with persistent nausea and vomiting. Pt found to be hypotensive on arrival to ED. Pt also with multiple falls recently. PMH includes DM, stg 4 esophageal cancer, HTN.  Clinical Impression  Pt presents to PT with deficits in strength, power, gait, balance, endurance. Pt reports lightheadedness and dizziness when ambulating, tachycardic up to 137 observed by PT and hypotensive upon completion of ambulation. Pt reports having multiple prior syncopal episodes at home. PT provides education on methods to utilize in an effort to reduce the risk of orthostatic hypotension. Pt is frail and fatigues rapidly at this time. Pt reports great difficulty with IADLs, especially cooking. Pt reports even if he is able to cook a meal for himself he is too exhausted to eat afterward. Pt will benefit from frequent mobilization in an effort to improve strength and endurance. PT recommends HHPT at the time of discharge as pt refuses SNF placement.       Recommendations for follow up therapy are one component of a multi-disciplinary discharge planning process, led by the attending physician.  Recommendations may be updated based on patient status, additional functional criteria and insurance authorization.  Follow Up Recommendations Home health PT      Assistance Recommended at Discharge PRN  Patient can return home with the following  A little help with walking and/or transfers;A little help with bathing/dressing/bathroom;Assistance with cooking/housework;Assist for transportation;Help with stairs or ramp for entrance    Equipment Recommendations BSC/3in1  Recommendations for Other Services       Functional Status Assessment Patient has had a recent decline in their functional status and demonstrates the ability to make  significant improvements in function in a reasonable and predictable amount of time.     Precautions / Restrictions Precautions Precautions: Fall Precaution Comments: history of syncope Restrictions Weight Bearing Restrictions: No      Mobility  Bed Mobility Overal bed mobility: Needs Assistance Bed Mobility: Supine to Sit, Sit to Supine     Supine to sit: Supervision Sit to supine: Supervision        Transfers Overall transfer level: Needs assistance Equipment used: Rolling walker (2 wheels) Transfers: Sit to/from Stand Sit to Stand: Supervision                Ambulation/Gait Ambulation/Gait assistance: Min guard Gait Distance (Feet): 100 Feet Assistive device: Rolling walker (2 wheels) Gait Pattern/deviations: Step-through pattern Gait velocity: reduced Gait velocity interpretation: <1.8 ft/sec, indicate of risk for recurrent falls   General Gait Details: slowed step-through gait, distance limited by reports of lightheadedness and dizziness. Pt bumps into multiple objects when ambulating but no LOB  Stairs            Wheelchair Mobility    Modified Rankin (Stroke Patients Only)       Balance Overall balance assessment: Needs assistance Sitting-balance support: No upper extremity supported, Feet supported Sitting balance-Leahy Scale: Good     Standing balance support: Bilateral upper extremity supported, Reliant on assistive device for balance Standing balance-Leahy Scale: Poor                               Pertinent Vitals/Pain Pain Assessment Pain Assessment: No/denies pain    Home Living Family/patient expects to be discharged to:: Private residence Living Arrangements: Alone Available Help  at Discharge: Family;Available PRN/intermittently (brother drives for [employer], always one phone call away) Type of Home: Apartment Home Access: Stairs to enter Entrance Stairs-Rails: Right Entrance Stairs-Number of Steps: 6   Home  Layout: One level Home Equipment: Conservation officer, nature (2 wheels);Cane - single point      Prior Function Prior Level of Function : Independent/Modified Independent             Mobility Comments: rapid decline, multiple recent falls, some associated with syncope. Pt ambulates with use of a RW or SPC       Hand Dominance        Extremity/Trunk Assessment   Upper Extremity Assessment Upper Extremity Assessment: Generalized weakness    Lower Extremity Assessment Lower Extremity Assessment: Generalized weakness    Cervical / Trunk Assessment Cervical / Trunk Assessment: Other exceptions Cervical / Trunk Exceptions: cachectic  Communication   Communication: No difficulties  Cognition Arousal/Alertness: Awake/alert Behavior During Therapy: WFL for tasks assessed/performed Overall Cognitive Status: Within Functional Limits for tasks assessed                                          General Comments General comments (skin integrity, edema, etc.): tachy up to 137 with ambulation, BP reading 95/84 at end of ambulation    Exercises     Assessment/Plan    PT Assessment Patient needs continued PT services  PT Problem List Decreased strength;Decreased activity tolerance;Decreased balance;Decreased mobility;Decreased safety awareness;Decreased knowledge of precautions;Cardiopulmonary status limiting activity       PT Treatment Interventions DME instruction;Gait training;Functional mobility training;Therapeutic activities;Therapeutic exercise;Neuromuscular re-education;Balance training;Cognitive remediation;Patient/family education    PT Goals (Current goals can be found in the Care Plan section)  Acute Rehab PT Goals Patient Stated Goal: to stop falling, improve tolerance for ambulation PT Goal Formulation: With patient Time For Goal Achievement: 03/02/22 Potential to Achieve Goals: Poor Additional Goals Additional Goal #1: Pt will be able to verbalize  methods to reduce the risk of orthostatic hypotension, including use of TED hose and increased time between positional changes    Frequency Min 3X/week     Co-evaluation               AM-PAC PT "6 Clicks" Mobility  Outcome Measure Help needed turning from your back to your side while in a flat bed without using bedrails?: A Little Help needed moving from lying on your back to sitting on the side of a flat bed without using bedrails?: A Little Help needed moving to and from a bed to a chair (including a wheelchair)?: A Little Help needed standing up from a chair using your arms (e.g., wheelchair or bedside chair)?: A Little Help needed to walk in hospital room?: A Little Help needed climbing 3-5 steps with a railing? : A Lot 6 Click Score: 17    End of Session   Activity Tolerance: Patient limited by fatigue;Treatment limited secondary to medical complications (Comment) (dizziness, lightheadedness) Patient left: in bed;with call bell/phone within reach Nurse Communication: Mobility status PT Visit Diagnosis: Other abnormalities of gait and mobility (R26.89);Muscle weakness (generalized) (M62.81);History of falling (Z91.81)    Time: 9373-4287 PT Time Calculation (min) (ACUTE ONLY): 24 min   Charges:   PT Evaluation $PT Eval Moderate Complexity: [place], PT, DPT Acute Rehabilitation Office [phone]   Zenaida Niece  02/16/2022, 5:35 PM

## 2022-02-16 NOTE — Consult Note (Signed)
Consultation Note Date: 02/16/2022   Patient Name: Trevor [name]  DOB: 11/27/1970  MRN: [id number]  Age / Sex: 51 y.o., male  PCP: [name], [place] Referring Physician: Florencia Reasons, MD  Reason for Consultation: Establishing goals of care  HPI/Patient Profile: 51 y.o. 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   Patient has had several admissions in the past 6 months, most recently earlier this month.  Currently admitted for AKI, dehydration.  PMT has been consulted to assist with goals of care conversation.  Clinical Assessment and Goals of Care:  I have reviewed medical records including EPIC notes, labs and imaging, received report from RN, assessed the patient and then met at the bedside to discuss diagnosis prognosis, GOC, EOL wishes, disposition and options.  I introduced Palliative Medicine as specialized medical care for people living with serious illness. It focuses on providing relief from the symptoms and stress of a serious illness. The goal is to improve quality of life for both the patient and the family.  We discussed a brief life review of the patient and then focused on their current illness.   I attempted to elicit values and goals of care important to the patient.    Medical History Review and Understanding:  Patient states he has a good understanding of the current acute illnesses being treated for such as electrolyte disturbances and dehydration, but he does not have a good understanding of his cancer.  He wants to know more about how the mass has responded to chemotherapy and other options including radiation.  Reviewed imaging with him from 10/11 and 10/24.  At his request, provided clarification on his rib fractures and that he has no  PEs.  Social History: Patient resides at home alone and is committed to going back to his apartment.  He has had previous help with meal assistance but notes this was with boxed foods that he did not have energy to cook himself.  Functional and Nutritional State: Patient uses a cane and a walker at baseline.  He is able to dress himself but notes this takes a very long time and wears him out.  He does not have the energy to cook for himself and is interested in resources such as Meals on Wheels.  Palliative Symptoms: Fatigue  Code Status: Concepts specific to code status, artifical feeding and hydration, and rehospitalization were considered and discussed.   Discussion: Patient's top priority and goal at this time is to gain enough weight to resume chemotherapy.  He is also very interested in talking to his oncologist to discuss potential radiation treatment to occur concurrently.  He feels like he has not received enough positive results from chemotherapy alone yet.   At his request, we discussed different options for replenishing electrolytes once he is home, including powdered supplement mixes.  We discussed his overall nutrition and he tells me he is not at goal with his tube feeds due to his issues with diarrhea.  He has tried some kind of "banana" treatment for this which worked in the past.  After an in-depth review of his most recent CTA and inability to visualize the mass well, he laments that he was not told about this so he could avoid eating beforehand.  Discussed the acute nature of the order to rule out PEs and he verbalized his understanding.  He is interested  in further imaging as soon as possible.  He is adamant that he is not "suffering" or at end-of-life.  We discussed the importance of safety and building up his strength giving his goals for aggressive care.  Explored his thoughts on short-term rehab and he is not going to consider this at all or at any time. Counseled on the  differences between home health and hospice - he was not sure about which services he had prior to admission.  He may try another home health agency at discharge because "they just took my temperature, talked to me, and left without working on my therapy." He also wants this PA to note that he needs liquid potassium at discharge for use at home. Ultimately, he is appreciative of advocacy and palliative support, agreeing to discuss goals of care further tomorrow.   Discussed the importance of continued conversation with family and the medical providers regarding overall plan of care and treatment options, ensuring decisions are within the context of the patient's values and GOCs.   Questions and concerns were addressed.  Hard Choices booklet left for review. The family was encouraged to call with questions or concerns.  PMT will continue to support holistically.   SUMMARY OF RECOMMENDATIONS   -Continue full code/full scope treatment -Patient's goal is to return home and discuss more options with oncology such as radiation, prolong his life as long as possible -He refuses SNF -TOC consulted for patient's request of meal assistance at home, assistance is appreciated -Ongoing GOC discussions pending clinical course -PMT will continue to follow and support  Prognosis:  Unable to determine  Discharge Planning: To Be Determined      Primary Diagnoses: Present on Admission:  Hypotension  Stage IV B Squamous cell esophageal cancer (HCC)  Hypokalemia  Protein-calorie malnutrition, severe  FTT (failure to thrive) in adult  Physical Exam Vitals and nursing note reviewed.  Constitutional:      General: He is not in acute distress.    Appearance: He is ill-appearing.  Cardiovascular:     Rate and Rhythm: Bradycardia present.  Pulmonary:     Effort: Pulmonary effort is normal. No respiratory distress.  Neurological:     Mental Status: He is alert and oriented to person, place, and time.   Psychiatric:        Mood and Affect: Mood normal.        Behavior: Behavior normal.   Vital Signs: BP 112/76   Pulse (!) 57   Temp 97.7 F (36.5 C) (Oral)   Resp 18   Ht $R'5\' 4"'QO$  (1.626 m)   Wt 39.8 kg   SpO2 100%   BMI 15.06 kg/m  Pain Scale: 0-10   Pain Score: 0-No pain   SpO2: SpO2: 100 % O2 Device:SpO2: 100 % O2 Flow Rate: .    Palliative Assessment/Data: 40% at best     MDM: High   Dagen Beevers Johnnette Litter, PA-C  Palliative Medicine Team Team phone # [phone]  Thank you for allowing the Palliative Medicine Team to assist in the care of this patient. Please utilize secure chat with additional questions, if there is no response within 30 minutes please call the above phone number.  Palliative Medicine Team providers are available by phone from 7am to 7pm daily and can be reached through the team cell phone.  Should this patient require assistance outside of these hours, please call the patient's attending physician.

## 2022-02-16 NOTE — Progress Notes (Signed)
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  Multiple complaints  Feet are numb and falling, three times before coming to the hospital  Coughing  yellow green mucus,for two wks, Feel sob when walk to the kitchen for a week, no chest pain, no fecver, states his oncologist advised him to come to the hospital due to feeling sob  Feeling vision is blurry for 5 days, no eye pain  Subjective:  He requests diet order changed to regular diet, he states he knows what he can keep down, he states he is very careful about choosing foot that can stay down, he states his oncologist told him that  he needs nurtrition to get his weight up to 100lbs to continue chemo  I told him initial CT showed "Dilated and debris filled proximal and mid esophagus without clear visualization of the distal esophageal mass " and advised him be careful with what he choose to eat by mouth.   States ensure /osmolite makes him having diarrhea, he wants to try boost He refused lovenox injection  He continue to complaint feet being numb after the chemo, he agrees increase neurontin dose, will have pt to see him as well  He states he used to work at [employer] as [employer],    Assessment & Plan:  Principal Problem:   Hypotension Active Problems:   Stage IV B Squamous cell esophageal cancer ([place])   Cavitary pneumonia/?? Lung Abscess   Hypokalemia   Protein-calorie malnutrition, severe   AKI (acute kidney injury) ([place])   Metabolic acidosis   Hypoglycemia   Nausea & vomiting   PEG (percutaneous endoscopic gastrostomy) status (HCC)   FTT (failure to thrive) in adult    Assessment and Plan:   Nausea & vomiting Normal LFTs and total bilirubin.  Benign abdominal exam. Suspect likely due to his esophageal malignancy and continued failure to thrive -As needed antiemetics -Continuous IV fluid hydration -diet changed to regular per patient's request, he is made aware that "Dilated and debris filled proximal and mid esophagus " seen on CT   Hypokalemia Hypophosphatemia Hypomagnesemia  Hypoglycemia, initial CBG in the 60s * Hypotension Azotemia/metabolic acidosis/mild elevated cr at 1.24, baseline 0.6 to 0.9 Initial  BP around 90/64 due to dehydration -Continue IV fluid hydration for another 24hrs -Hold home antihypertensive  Cavitary pneumonia/?? Lung Abscess Previous hospitalization with questionable cavitary pneumonia/lung abscess and underwent bronchoscopy on 10/2.  Has been placed on 4 weeks of Augmentin per pulmonology. -Continue Augmentin  Stage IV B Squamous cell esophageal cancer (HCC) -Follows Dr. Delton Coombes at [facility]  - previously on FOLFOX +Nivolumab -completed 6 cycles on 01/04/2022 but on hold due to continual weight loss -takes osmolite through PEG but continues to take solid by mouth by his preference   PEG (percutaneous endoscopic gastrostomy) status (HCC) Continue osmolite 5x daily  Peripheral neuropathy Appear has h/o alcohol use and peripheral neuropathy symptoms proceeds chemotherapy, but symptom got a lot worse after chemo Start neurontin and titrate up  Protein-calorie malnutrition, severe Patient severely cachectic on exam.  Chemotherapy currently on hold due to continual weight loss. -Continue Osmolite through PEG tube  FTT/ poor prognosis, palliative care consulted for goals of care discussion     I have Reviewed nursing notes,  Vitals, pain scores, I/o's, Lab results and  imaging results since pt's last encounter, details please see discussion above  I ordered the following labs:  Unresulted Labs (From admission, onward)     Start     Ordered   02/16/22 0615  CBC with  Differential/Platelet  Once,   R        02/16/22 0615   02/16/22 0500  CBC with Differential/Platelet  Tomorrow morning,   R        02/15/22 0656   02/15/22 0900  Expectorated Sputum Assessment w Gram Stain, Rflx to Resp Cult  Once,   R        02/15/22 0859             DVT prophylaxis: enoxaparin (LOVENOX) injection 30 mg Start: 02/14/22 2100   Code Status:   Code Status: Full Code  Family Communication: none at bedside  Disposition:    Dispo: The patient is from: home              Anticipated d/c is to: home              Anticipated d/c date is: too weak, will need palliative care, will need Pt eval, he lives by himself, does not have good social support  Antimicrobials:    Anti-infectives (From admission, onward)    Start     Dose/Rate Route Frequency Ordered Stop   02/14/22 2330  amoxicillin-clavulanate (AUGMENTIN) 400-57 MG/5ML suspension 800 mg        800 mg of amoxicillin Per Tube 2 times daily 02/14/22 2058 04/09/22 0959          Objective: Vitals:   02/15/22 2039 02/15/22 2100 02/16/22 0003 02/16/22 0505  BP: 100/71  102/81 96/76  Pulse: 61  69 77  Resp: _0 Temp: 97.7 F (36.5 C)  (!) 97.5 F (36.4 C) 97.9 F (36.6 C)  TempSrc: Oral  Oral Oral  SpO2: 99%  100% 99%  Weight:  38.1 kg  39.8 kg  Height:  _1  (1.626 m)  _2  (1.626 m)    Intake/Output Summary (Last 24 hours) at 02/16/2022 0705 Last data filed at 02/16/2022 0600 Gross per 24 hour  Intake 1129.41 ml  Output 100 ml  Net 1029.41 ml   Filed Weights   02/15/22 2100 02/16/22 0505  Weight: 38.1 kg 39.8 kg    Examination:  General exam: alert, awake, communicative,calm, NAD Respiratory system: Clear to auscultation. Respiratory effort normal. Cardiovascular system:  RRR.  Gastrointestinal system: Abdomen is nondistended, soft and nontender.  Normal bowel sounds heard. Central nervous system: Alert and oriented. No focal neurological deficits. Extremities:  no  edema Skin: No rashes, lesions or ulcers Psychiatry: Judgement and insight appear normal. Mood & affect appropriate.     Data Reviewed: I have personally reviewed  labs and visualized  imaging studies since the last encounter and formulate the plan        Scheduled Meds:  allopurinol  300 mg Per Tube Daily   amoxicillin-clavulanate  800 mg of amoxicillin Per Tube BID   enoxaparin (LOVENOX) injection  30 mg Subcutaneous Q24H   feeding supplement (OSMOLITE 1.5 CAL)  237 mL Per Tube 5 X Daily   free water  120 mL Per Tube 6 X Daily   gabapentin  100 mg Oral QHS   guaiFENesin  600 mg Oral BID   magnesium oxide  400 mg Per Tube Daily   megestrol  400 mg  Per Tube BID   sodium chloride flush  3 mL Intravenous Q12H   [START ON 02/20/2022] Vitamin D (Ergocalciferol)  50,000 Units Oral Q Mon   Continuous Infusions:  sodium chloride     lactated ringers 75 mL/hr at 02/15/22 2101   sodium phosphate 15 mmol in dextrose 5 % 250 mL infusion       LOS: 1 day     Florencia Reasons, MD PhD FACP [facility]  Available via Epic secure chat 7am-7pm for nonurgent issues Please page for urgent issues To page the attending provider between 7A-7P or the covering provider during after hours 7P-7A, please log into the web site [email or web address] and access using universal [place] password for that web site. If you do not have the password, please call the hospital operator.    02/16/2022, 7:05 AM

## 2022-02-16 NOTE — Progress Notes (Signed)
Initial Nutrition Assessment  DOCUMENTATION CODES:   Underweight, Severe malnutrition in context of chronic illness  INTERVENTION:   Bolus tube feeding regimen via G-tube: - Administer 1 carton (237 ml) of Osmolite 1.5 cal tube feeding formula QID at 0700, 1000, 1300, and 1600 - Flush G-tube with 50 ml free water before and after each bolus  Bolus tube feeding regimen with free water flushes provides 1420 kcal, 60 grams of protein, and 1124 ml of H2O (meets 71% of minimum kcal needs and 60% of minimum protein needs). Goal tube feeding regimen would be 1 carton (237 ml) of Osmolite 1.5 cal tube feeding formula 5 x daily.  - Banatrol TF 60 ml BID per tube  - Ensure Enlive po TID, each supplement provides 350 kcal and 20 grams of protein  - Medical illustrator with whole milk po TID with meals, each supplement provides 290 kcal and 13 grams of protein  - Continue Regular diet per pt request  NUTRITION DIAGNOSIS:   Severe Malnutrition related to chronic illness (stage IV esophageal cancer with mets) as evidenced by severe fat depletion, severe muscle depletion, percent weight loss (47.8% weight loss since 05/30/21).  GOAL:   Patient will meet greater than or equal to 90% of their needs  MONITOR:   PO intake, Supplement acceptance, Labs, Weight trends, TF tolerance, I & O's  REASON FOR ASSESSMENT:   Consult Enteral/tube feeding initiation and management  ASSESSMENT:   51 year old male who presented to the ED on 10/24 with chest pain, weakness. 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  Per notes, chemotherapy on hold due to continued weight loss. Pt takes tube feeds via G-tube but also eats by mouth. Pt currently on a Regular diet with meal completion of 75% documented for breakfast today.  Spoke with pt at bedside. Pt reports that he has  only received/accepted 1 tube feeding bolus this admission. Pt is refusing Osmolite 1.5 tube feeds due to diarrhea. He refused to consume Boost Breeze earlier due to fullness. When asked what tube feeding formula pt has tolerated best so far, pt reports Ensure Plus High Protein (which is an oral supplement that can be administered per tube in certain situations but is not a tube feeding formula). Discussed Banatrol TF as an option with pt. He is willing to try Osmolite 1.5 tube feeds again if RD orders Banatrol TF to help with diarrhea. RD discussed that Banatrol TF can take some time to be effective and that pt has to stick with it. Pt in agreement with plan.  Pt reports that he typically takes tube feeds at 0700, 1000, 1300, 2000, and 2200 at home. Pt wants to stop taking tube feeds so late as the later ones are making it so that he has diarrhea while he is sleeping which makes it hard to get to the bathroom. Pt willing to receive last bolus at 1600. RD to adjust schedule. If Banatrol TF helps pt's diarrhea, pt willing to add fifth bolus later in the evening.  In terms of oral nutrition supplements, pt asking RD whether Boost Breeze or Ensure Plus High Protein is better. Discussed with pt that Ensure Plus High Protein has more kcal and more protein. Pt willing to take these po. He would also like to receive [employer] with whole milk to mix into oatmeal at each meal. RD has added this  as a supplement in [facility].  Pt with 2 cheeseburgers on lunch plate at time of RD visit. Pt states that he plans to eat them. When RD asked if pt can tolerate these, he reports that "some comes up, but some stays down." Suspect pt regurgitating some of the more solid foods that he is consuming due to esophageal mass. CT angio on 10/24, pt with dilated and debris filled proximal and mid esophagus. Per MD note, pt is aware of this and requests regular diet despite these issues. Pt states that he knows what  he can order and what he can keep down. Pt spitting up phlegm and spitting into trash can at bedside throughout RD visit.  RN in room towards end of visit. Discussed plan for next bolus at 1600 with addition of Banatrol TF to help with diarrhea.  Reviewed weight history in chart. Pt with a 36.4 kg weight loss since 05/30/21. This is a 47.8% weight loss in less than 1 year which is severe and significant for timeframe. Palliative Medicine has been consulted regarding [place] discussion.  Current TF: Osmolite 1.5 cal 237 ml 5 x daily, free water flushes of 120 ml 6 x daily (pt refusing tube feeds)  Medications reviewed and include: augmentin, Boost Breeze TID, megace 400 mg BID, vitamin D 50,000 units weekly IVF: LR @ 75 ml/hr  Labs reviewed: BUN 26, phosphorus 1.8, hemoglobin 8.8 CBG's: 78-161 x 24 hours  NUTRITION - FOCUSED PHYSICAL EXAM:  Flowsheet Row Most Recent Value  Orbital Region Severe depletion  Upper Arm Region Severe depletion  Thoracic and Lumbar Region Severe depletion  Buccal Region Severe depletion  Temple Region Severe depletion  Clavicle Bone Region Severe depletion  Clavicle and Acromion Bone Region Severe depletion  Scapular Bone Region Severe depletion  Dorsal Hand Severe depletion  Patellar Region Severe depletion  Anterior Thigh Region Severe depletion  Posterior Calf Region Severe depletion  Edema (RD Assessment) None  Hair Reviewed  Eyes Reviewed  Mouth Reviewed  [poor dentition]  Skin Reviewed  Nails Reviewed       Diet Order:   Diet Order             Diet regular Room service appropriate? Yes; Fluid consistency: Thin  Diet effective now                   EDUCATION NEEDS:   Education needs have been addressed  Skin:  Skin Assessment: Reviewed RN Assessment  Last BM:  02/15/22  Height:   Ht Readings from Last 1 Encounters:  02/16/22 '5\' 4"'$  (1.626 m)    Weight:   Wt Readings from Last 1 Encounters:  02/16/22 39.8 kg    Ideal Body  Weight:  59.1 kg  BMI:  Body mass index is 15.06 kg/m.  Estimated Nutritional Needs:   Kcal:  2000-2200  Protein:  100-115 grams  Fluid:  >2.0 L    Gustavus Bryant, MS, RD, LDN Inpatient Clinical Dietitian Please see AMiON for contact information.

## 2022-02-16 NOTE — TOC Initial Note (Signed)
Transition of Care [facility]) - Initial/Assessment Note    Patient Details  Name: Trevor [name] MRN: [id number] Date of Birth: 01-Aug-1970  Transition of Care [facility]) CM/SW Contact:    Tom-Johnson, Renea Ee, RN Phone Number: 02/16/2022, 11:22 AM  Clinical Narrative:                  CM spoke with patient at bedside about needs for post hospital transition. Admitted for Hypotension.  Patient was recently discharged from the hospital. Now admitted for Hypotension. Patient states he lives alone, has three children who lives in [place]. Has three siblings but not that supportive with care. 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  Does not drive, brother gives him a ride or uses public transportation. Has a cane and  walker at home. PCP is [name], [name] [facility] [address]. Awaiting PT eval for disposition. CM will continue to follow as patient progresses towards discharge.     Barriers to Discharge: Continued Medical Work up   Patient Goals and CMS Choice Patient states their goals for this hospitalization and ongoing recovery are:: To return home CMS Medicare.gov Compare Post Acute Care list provided to:: Patient    Expected Discharge Plan and Services     Discharge Planning Services: CM Consult   Living arrangements for the past 2 months: Apartment                                      Prior Living Arrangements/Services Living arrangements for the past 2 months: Apartment Lives with:: Self Patient language and need for interpreter reviewed:: Yes Do you feel safe going back to the place where you live?: Yes      Need for Family Participation in Patient Care: Yes (Comment) Care giver support system in place?: Yes (comment) Current home services: DME, Home PT, Home OT Criminal Activity/Legal Involvement Pertinent to Current Situation/Hospitalization: No - Comment as needed  Activities of Daily Living Home Assistive  Devices/Equipment: Walker (specify type), Cane (specify quad or straight) ADL Screening (condition at time of admission) Patient's cognitive ability adequate to safely complete daily activities?: Yes Is the patient deaf or have difficulty hearing?: No Does the patient have difficulty seeing, even when wearing glasses/contacts?: No Does the patient have difficulty concentrating, remembering, or making decisions?: No Patient able to express need for assistance with ADLs?: Yes Does the patient have difficulty dressing or bathing?: No Independently performs ADLs?: Yes (appropriate for developmental age) Does the patient have difficulty walking or climbing stairs?: Yes Weakness of Legs: Both Weakness of Arms/Hands: None  Permission Sought/Granted Permission sought to share information with : Case Manager, Family Supports Permission granted to share information with : Yes, Verbal Permission Granted              Emotional Assessment Appearance:: Appears stated age Attitude/Demeanor/Rapport: Engaged Affect (typically observed): Accepting, Appropriate, Hopeful Orientation: : Oriented to Self, Oriented to Place, Oriented to  Time, Oriented to Situation Alcohol / Substance Use: Not Applicable Psych Involvement: No (comment)  Admission diagnosis:  FTT (failure to thrive) in adult [R62.7] Generalized weakness [R53.1] Hypotension [I95.9] Bilious vomiting with nausea [R11.14] Chest pain, unspecified type [R07.9] Patient Active Problem List   Diagnosis Date Noted   FTT (failure to thrive) in adult 02/15/2022   Hypotension 02/14/2022   AKI (acute kidney injury) ([place]) 89/38/1017   Metabolic  acidosis 02/14/2022   Hypoglycemia 02/14/2022   Nausea & vomiting 02/14/2022   PEG (percutaneous endoscopic gastrostomy) status ([place]) 02/14/2022   Hyponatremia 01/23/2022   Pulmonary nodules/lesions, multiple    Cavitary pneumonia/?? Lung Abscess 01/21/2022   Acute bronchitis 11/29/2021   Iron  deficiency anemia 10/27/2021   Protein-calorie malnutrition, severe 10/11/2021   Stage IV Esophageal carcinoma  10/10/2021   Stage IV B Squamous cell esophageal cancer ([place]) 09/13/2021   Visual disturbance 08/18/2021   Dysphagia 08/18/2021   Vitamin D deficiency 05/31/2021   Hypomagnesemia 05/31/2021   Hypokalemia 05/30/2021   HTN (hypertension) 05/30/2021   Tachycardia, unspecified 05/30/2021   Gout 05/30/2021   GERD (gastroesophageal reflux disease) 05/30/2021   Alcohol abuse 05/30/2021   Tobacco abuse 05/30/2021   PCP:  [name], [name] Pharmacy:   [place] [address] Phone: [phone] Fax: [phone]  [id number] [place]: [phone]: [phone]Phone: [phone]: [phone]    Social Determinants of Health (SDOH) Interventions    Readmission Risk Interventions    01/24/2022    3:13 PM  Readmission Risk Prevention Plan  Transportation Screening Complete  PCP or Specialist Appt within 3-5 Days Complete  [place] or [place]  Social Work Consult for RE[place]Planning/Counseling Complete  Palliative Care Screening Complete  Medication Review (Press photographer Complete

## 2022-02-17 DIAGNOSIS — R627 Adult failure to thrive: Secondary | ICD-10-CM | POA: Diagnosis not present

## 2022-02-17 DIAGNOSIS — E162 Hypoglycemia, unspecified: Secondary | ICD-10-CM | POA: Diagnosis not present

## 2022-02-17 DIAGNOSIS — J189 Pneumonia, unspecified organism: Secondary | ICD-10-CM | POA: Diagnosis not present

## 2022-02-17 DIAGNOSIS — I9589 Other hypotension: Secondary | ICD-10-CM | POA: Diagnosis not present

## 2022-02-17 LAB — CBC WITH DIFFERENTIAL/PLATELET
Abs Immature Granulocytes: 0.03 10*3/uL (ref 0.00–0.07)
Basophils Absolute: 0 10*3/uL (ref 0.0–0.1)
Basophils Relative: 0 %
Eosinophils Absolute: 0.1 10*3/uL (ref 0.0–0.5)
Eosinophils Relative: 2 %
HCT: 27.1 % — ABNORMAL LOW (ref 39.0–52.0)
Hemoglobin: 8.9 g/dL — ABNORMAL LOW (ref 13.0–17.0)
Immature Granulocytes: 0 %
Lymphocytes Relative: 21 %
Lymphs Abs: 1.7 10*3/uL (ref 0.7–4.0)
MCH: 25.8 pg — ABNORMAL LOW (ref 26.0–34.0)
MCHC: 32.8 g/dL (ref 30.0–36.0)
MCV: 78.6 fL — ABNORMAL LOW (ref 80.0–100.0)
Monocytes Absolute: 0.6 10*3/uL (ref 0.1–1.0)
Monocytes Relative: 8 %
Neutro Abs: 5.4 10*3/uL (ref 1.7–7.7)
Neutrophils Relative %: 69 %
Platelets: 164 10*3/uL (ref 150–400)
RBC: 3.45 MIL/uL — ABNORMAL LOW (ref 4.22–5.81)
RDW: 22.4 % — ABNORMAL HIGH (ref 11.5–15.5)
WBC: 7.9 10*3/uL (ref 4.0–10.5)
nRBC: 0 % (ref 0.0–0.2)

## 2022-02-17 LAB — BASIC METABOLIC PANEL
Anion gap: 8 (ref 5–15)
BUN: 17 mg/dL (ref 6–20)
CO2: 22 mmol/L (ref 22–32)
Calcium: 8.9 mg/dL (ref 8.9–10.3)
Chloride: 108 mmol/L (ref 98–111)
Creatinine, Ser: 0.74 mg/dL (ref 0.61–1.24)
GFR, Estimated: >60 mL/min (ref >60)
Glucose, Bld: 72 mg/dL (ref 70–99)
Potassium: 3.1 mmol/L — ABNORMAL LOW (ref 3.5–5.1)
Sodium: 138 mmol/L (ref 135–145)

## 2022-02-17 LAB — GLUCOSE, CAPILLARY: Glucose-Capillary: 85 mg/dL (ref 70–99)

## 2022-02-17 LAB — MAGNESIUM: Magnesium: 1.8 mg/dL (ref 1.7–2.4)

## 2022-02-17 LAB — PHOSPHORUS: Phosphorus: 2.7 mg/dL (ref 2.5–4.6)

## 2022-02-17 MED ORDER — POTASSIUM CHLORIDE 10 MEQ/100ML IV SOLN
10.0000 meq | INTRAVENOUS | Status: AC
  Administered 2022-02-17 (×4): 10 meq via INTRAVENOUS
  Filled 2022-02-17 (×4): qty 100

## 2022-02-17 MED ORDER — LACTATED RINGERS IV SOLN: INTRAVENOUS | Status: DC

## 2022-02-17 MED ORDER — MAGNESIUM SULFATE 2 GM/50ML IV SOLN
2.0000 g | Freq: Once | INTRAVENOUS | Status: AC
  Administered 2022-02-17: 2 g via INTRAVENOUS
  Filled 2022-02-17: qty 50

## 2022-02-17 MED ORDER — OSMOLITE 1.5 CAL PO LIQD
237.0000 mL | Freq: Four times a day (QID) | ORAL | Status: DC
  Filled 2022-02-17: qty 237

## 2022-02-17 MED ORDER — BANATROL TF EN LIQD
60.0000 mL | Freq: Three times a day (TID) | ENTERAL | Status: DC
  Administered 2022-02-17 – 2022-02-19 (×6): 60 mL
  Filled 2022-02-17 (×8): qty 60

## 2022-02-17 NOTE — Progress Notes (Signed)
Physical Therapy Treatment Patient Details Name: Trevor [name] MRN: [id number] DOB: June 18, 1970 Today's Date: 02/17/2022   History of Present Illness 51 y.o. male presents to [facility] hospital on 02/14/2022 with persistent nausea and vomiting. Pt found to be hypotensive on arrival to ED. Pt also with multiple falls recently. PMH includes DM, stg 4 esophageal cancer, HTN.    PT Comments    Patient reporting fatigue after long day but agreeable to only ambulation this date. Patient required minA to avoid falling off EOB upon sitting up. Ambulated 2' with RW and minA due to B knee instability and forward flexed trunk. Adamant about returning home with HHPT and will not consider rehab or anything that makes him leave his home besides the hospital.     Recommendations for follow up therapy are one component of a multi-disciplinary discharge planning process, led by the attending physician.  Recommendations may be updated based on patient status, additional functional criteria and insurance authorization.  Follow Up Recommendations  Home health PT     Assistance Recommended at Discharge PRN  Patient can return home with the following A little help with walking and/or transfers;A little help with bathing/dressing/bathroom;Assistance with cooking/housework;Assist for transportation;Help with stairs or ramp for entrance   Equipment Recommendations  BSC/3in1    Recommendations for Other Services       Precautions / Restrictions Precautions Precautions: Fall Precaution Comments: history of syncope Restrictions Weight Bearing Restrictions: No     Mobility  Bed Mobility Overal bed mobility: Needs Assistance Bed Mobility: Supine to Sit, Sit to Supine     Supine to sit: Min assist Sit to supine: Supervision   General bed mobility comments: assist to prevent fall forward off EOB upon sitting up    Transfers Overall transfer level: Needs assistance Equipment used: Rolling Trevor [name] (2  wheels) Transfers: Sit to/from Stand Sit to Stand: Min guard           General transfer comment: min guard for safety    Ambulation/Gait Ambulation/Gait assistance: Min assist Gait Distance (Feet): 70 Feet Assistive device: Rolling Trevor [name] (2 wheels) Gait Pattern/deviations: Step-through pattern Gait velocity: decreased     General Gait Details: assist for balance as patient demonstrating knee buckling especially during turns. cues for RW proximity.   Stairs             Wheelchair Mobility    Modified Rankin (Stroke Patients Only)       Balance                                            Cognition Arousal/Alertness: Awake/alert Behavior During Therapy: WFL for tasks assessed/performed Overall Cognitive Status: Within Functional Limits for tasks assessed                                          Exercises      General Comments        Pertinent Vitals/Pain Pain Assessment Pain Assessment: No/denies pain    Home Living                          Prior Function            PT Goals (current goals can now be found in the care plan section)  Acute Rehab PT Goals PT Goal Formulation: With patient Time For Goal Achievement: 03/02/22 Potential to Achieve Goals: Poor Progress towards PT goals: Progressing toward goals    Frequency    Min 3X/week      PT Plan Current plan remains appropriate    Co-evaluation              AM-PAC PT "6 Clicks" Mobility   Outcome Measure  Help needed turning from your back to your side while in a flat bed without using bedrails?: A Little Help needed moving from lying on your back to sitting on the side of a flat bed without using bedrails?: A Little Help needed moving to and from a bed to a chair (including a wheelchair)?: A Little Help needed standing up from a chair using your arms (e.g., wheelchair or bedside chair)?: A Little Help needed to walk in hospital  room?: A Little Help needed climbing 3-5 steps with a railing? : A Lot 6 Click Score: 17    End of Session Equipment Utilized During Treatment: Gait belt Activity Tolerance: Patient limited by fatigue Patient left: in bed;with call bell/phone within reach Nurse Communication: Mobility status PT Visit Diagnosis: Other abnormalities of gait and mobility (R26.89);Muscle weakness (generalized) (M62.81);History of falling (Z91.81)     Time: 2924-4628 PT Time Calculation (min) (ACUTE ONLY): 23 min  Charges:  $Therapeutic Activity: 23-37 mins                     Gianelle Mccaul A. Gilford Rile PT, DPT Acute Rehabilitation Services Office [phone]    Linna Hoff 02/17/2022, 4:11 PM

## 2022-02-17 NOTE — Progress Notes (Signed)
Orthostatic VS completed, pt. Required 2 assist for standing VS, unable to complete 3 min standing VS, pt unable to tol. Standing up for prolonged periods  Anastasio Auerbach, RN

## 2022-02-17 NOTE — TOC Progression Note (Signed)
Transition of Care [facility]) - Progression Note    Patient Details  Name: Trevor [name] MRN: [id number] Date of Birth: 1971/03/13  Transition of Care [facility]) CM/SW [place], RN Phone Number: 02/17/2022, 2:11 PM  Clinical Narrative:     Verified w [phone] liaison that patient is active for [facility] PT OT.   Expected Discharge Plan: [place] Barriers to Discharge: Continued Medical Work up  Expected Discharge Plan and Services Expected Discharge Plan: [place]   Discharge Planning Services: CM Consult   Living arrangements for the past 2 months: [place]: [place] Date [place]: 02/17/22 Time [place]: 7841 Representative spoke with at [place]: [place] ([place]) Interventions    Readmission Risk Interventions    01/24/2022    3:13 PM  Readmission Risk Prevention Plan  Transportation Screening Complete  PCP or Specialist Appt within 3-5 Days Complete  HRI or [place] Complete  Social Work Consult for [place] Planning/Counseling Complete  Palliative Care Screening Complete  Medication Review Press photographer) Complete

## 2022-02-17 NOTE — Progress Notes (Signed)
                                                                                                                                                                                                           Daily Progress Note   Patient Name: Trevor [name]       Date: 02/17/2022 DOB: Sep 04, 1970  Age: 51 y.o. MRN#: [id number] Attending Physician: Florencia Reasons, MD Primary Care Physician: [name], [name] Admit Date: 02/14/2022  Reason for Consultation/Follow-up: {Reason for Consult:23484}  Subjective: ***  Length of Stay: 2   Physical Exam          Vital Signs: BP 110/79 (BP Location: Left Arm)   Pulse 78   Temp 98.3 F (36.8 C) (Oral)   Resp 18   Ht '5\' 4"'$  (1.626 m)   Wt 39.8 kg   SpO2 98%   BMI 15.06 kg/m  SpO2: SpO2: 98 % O2 Device: O2 Device: Room Air O2 Flow Rate:        Palliative Assessment/Data:       Palliative Care Assessment & Plan   Patient Profile: ***  Assessment: ***  Recommendations/Plan: ***   Prognosis:  {Palliative Care Prognosis:23504}  Discharge Planning: {Palliative dispostion:23505}  Care plan was discussed with ***   Total time: I spent *** minutes in the care of the patient today in the above activities and documenting the encounter.  MDM ***         Norberta Keens, PA-C  Palliative Medicine Team Team phone # [phone]  Thank you for allowing the Palliative Medicine Team to assist in the care of this patient. Please utilize secure chat with additional questions, if there is no response within 30 minutes please call the above phone number.  Palliative Medicine Team providers are available by phone from 7am to 7pm daily and can be reached through the team cell phone.  Should this patient require assistance outside of these hours, please call the patient's attending physician.

## 2022-02-17 NOTE — Progress Notes (Signed)
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  Multiple complaints  Feet are numb and falling, three times before coming to the hospital  Coughing  yellow green mucus,for two wks, Feel sob when walk to the kitchen for a week, no chest pain, no fecver, states his oncologist advised him to come to the hospital due to feeling sob  Feeling vision is blurry for 5 days, no eye pain  Subjective:   He continue to reports feeling weak, not feeling better, he reports too weak to stand up He requests diet order changed to regular diet, he wants double portion Per RN, patient refusing TF and flushes this am, he would like to eat breakfast by mouth instead he states he knows what he can keep down, he states he is very careful about choosing foot that can stay down, he states his oncologist told him that  he needs nurtrition to get his weight up to 100lbs to continue chemo  I told him initial CT showed "Dilated and debris filled proximal and mid esophagus without clear visualization of the distal esophageal mass " and advised him be careful with what he choose to eat by mouth.    He refused lovenox injection     Assessment & Plan:  Principal Problem:   Hypotension Active Problems:   Stage IV B Squamous cell esophageal cancer (HCC)   Cavitary pneumonia/?? Lung Abscess   Hypokalemia   Protein-calorie malnutrition, severe   AKI (acute kidney injury) (HCC)   Metabolic acidosis   Hypoglycemia   Nausea & vomiting   PEG (percutaneous endoscopic gastrostomy) status (HCC)   FTT (failure to thrive) in adult    Assessment and Plan:   Nausea & vomiting Normal LFTs and total bilirubin. Benign abdominal exam. Suspect  likely due to his esophageal malignancy and continued failure to thrive -As needed antiemetics -Continuous IV fluid hydration -diet changed to regular per patient's request, he is made aware that "Dilated and debris filled proximal and mid esophagus " seen on CT   Hypokalemia Hypophosphatemia Hypomagnesemia  Hypoglycemia, initial CBG in the 60s * Hypotension Azotemia/metabolic acidosis/mild elevated cr at 1.24, baseline 0.6 to 0.9 Initial  BP around 90/64 due to dehydration -Continue IV fluid hydration for another 24hrs -continue to have orthostatic hypotension,Hold home antihypertensive, check am cortisol  Cavitary pneumonia/?? Lung Abscess Previous hospitalization with questionable cavitary pneumonia/lung abscess and underwent bronchoscopy on 10/2.  Has been placed on 4 weeks of Augmentin per pulmonology. -Continue Augmentin  Stage IV B Squamous cell esophageal cancer (HCC) -Follows Dr. Delton Coombes at [facility]  - previously on FOLFOX +Nivolumab -completed 6 cycles on 01/04/2022 but on hold due to continual weight loss -takes osmolite through PEG but continues to take solid by mouth by his preference   PEG (percutaneous endoscopic gastrostomy) status (HCC) Continue osmolite 5x daily  Peripheral neuropathy Appear has h/o alcohol use and peripheral neuropathy symptoms proceeds chemotherapy, but symptom got a lot worse after chemo Start neurontin and titrate up  Protein-calorie malnutrition, severe Patient severely cachectic on exam.  Chemotherapy currently on hold due to continual weight loss. -Continue Osmolite through PEG tube  FTT/ poor prognosis, palliative care consulted for goals of care discussion     I have Reviewed nursing notes,  Vitals, pain scores, I/o's, Lab results and  imaging results since pt's last encounter, details please see discussion above  I ordered the following labs:  Unresulted Labs (From admission, onward)     Start     Ordered   02/18/22  0500  Cortisol  Tomorrow morning,   R       Question:  Specimen collection method  Answer:  IV Team=IV Team collect   02/17/22 0951   02/18/22 9563  Basic metabolic panel  Tomorrow morning,   R       Question:  Specimen collection method  Answer:  IV Team=IV Team collect   02/17/22 0951   02/18/22 0500  Magnesium  Tomorrow morning,   R       Question:  Specimen collection method  Answer:  IV Team=IV Team collect   02/17/22 0951   02/16/22 0500  CBC with Differential/Platelet  Tomorrow morning,   R        02/15/22 0656   02/15/22 0900  Expectorated Sputum Assessment w Gram Stain, Rflx to Resp Cult  Once,   R        02/15/22 0859             DVT prophylaxis: Place TED hose Start: 02/16/22 1743 enoxaparin (LOVENOX) injection 30 mg Start: 02/14/22 2100   Code Status:   Code Status: Full Code  Family Communication: none at bedside ,  Disposition:    Dispo: The patient is from: home alone, He states he used to work at [employer] in [employer], reports lost his job after being sick              Anticipated d/c is to: he is very weak but he is adamant about going home, he does not want to go to rehab              Anticipated d/c date is: too weak, will need palliative care, will need Pt eval, he lives by himself, does not have good social support  Antimicrobials:    Anti-infectives (From admission, onward)    Start     Dose/Rate Route Frequency Ordered Stop   02/14/22 2330  amoxicillin-clavulanate (AUGMENTIN) 400-57 MG/5ML suspension 800 mg        800 mg of amoxicillin Per Tube 2 times daily 02/14/22 2058 04/09/22 0959          Objective: Vitals:   02/16/22 1553 02/16/22 2012 02/17/22 0518 02/17/22 0801  BP: 106/83 108/81 106/84 110/79  Pulse: 82 83 93 78  Resp: _0 Temp: 98.2 F (36.8 C) 98.1 F (36.7 C) 98.1 F (36.7 C) 98.3 F (36.8 C)  TempSrc: Oral Oral Oral Oral  SpO2: 100% 100% 100% 98%  Weight:      Height:        Intake/Output Summary (Last  24 hours) at 02/17/2022 0952 Last data filed at 02/17/2022 0601 Gross per 24 hour  Intake 3019.69 ml  Output 75 ml  Net 2944.69 ml   Filed Weights   02/15/22 2100 02/16/22 0505  Weight: 38.1 kg 39.8 kg    Examination:  General exam: alert, awake, communicative,calm, NAD Respiratory system: Clear to auscultation. Respiratory effort normal. Cardiovascular system:  RRR.  Gastrointestinal system: Abdomen is nondistended, soft and nontender.  Normal bowel sounds heard. Central nervous system: Alert and oriented. No focal neurological deficits. Extremities:  no edema Skin: No rashes, lesions or ulcers Psychiatry: Judgement and insight appear normal. Mood & affect appropriate.  Data Reviewed: I have personally reviewed  labs and visualized  imaging studies since the last encounter and formulate the plan        Scheduled Meds:  allopurinol  300 mg Per Tube Daily   amoxicillin-clavulanate  800 mg of amoxicillin Per Tube BID   Chlorhexidine Gluconate Cloth  6 each Topical Daily   enoxaparin (LOVENOX) injection  30 mg Subcutaneous Q24H   feeding supplement  237 mL Oral TID BM   feeding supplement (OSMOLITE 1.5 CAL)  237 mL Per Tube QID   fiber supplement (BANATROL TF)  60 mL Per Tube BID   free water  100 mL Per Tube QID   gabapentin  100 mg Per Tube Q12H   guaiFENesin  10 mL Per Tube BID   megestrol  400 mg Per Tube BID   sodium chloride flush  10-40 mL Intracatheter Q12H   sodium chloride flush  3 mL Intravenous Q12H   [START ON 02/20/2022] Vitamin D (Ergocalciferol)  50,000 Units Oral Q Mon   Continuous Infusions:  sodium chloride     lactated ringers 75 mL/hr at 02/17/22 9811   lactated ringers     potassium chloride 10 mEq (02/17/22 0756)     LOS: 2 days     Florencia Reasons, MD PhD FACP [facility]  Available via Epic secure chat 7am-7pm for nonurgent issues Please page for urgent issues To page the attending provider between 7A-7P or the covering provider  during after hours 7P-7A, please log into the web site [email or web address] and access using universal [place] password for that web site. If you do not have the password, please call the hospital operator.    02/17/2022, 9:52 AM

## 2022-02-17 NOTE — Progress Notes (Signed)
Patient refusing his TF and flushes this morning, pt. States he would like to eat his breakfast instead, Dr. Erlinda Hong paged and made aware of K 3.1 this morning  Trevor [name]

## 2022-02-17 NOTE — Plan of Care (Signed)

## 2022-02-17 NOTE — Progress Notes (Signed)
Brief Nutrition Note  Spoke with pt at bedside. Pt reports that he tolerated the bolus of Osmolite 1.5 tube feeding formula yesterday afternoon well. He endorses having some diarrhea after tube feeding but that it was much improved due to the Banatrol TF. Pt willing to try increasing Banatrol TF from BID to TID.  Noted pt refused 0700 bolus and 1000 bolus of Osmolite 1.5. When asked why, pt reports that the first bolus was offered to him "at 4:00 in the morning" and he was still asleep and that the second bolus came too quickly after breakfast. Pt requesting RD adjust bolus schedule.  Bolus tube feeding regimen via G-tube: - Administer 1 carton (237 ml) of Osmolite 1.5 cal tube feeding formula QID at 0800, 1100, 1400, and 1700 - Flush G-tube with 50 ml free water before and after each bolus   Bolus tube feeding regimen with free water flushes provides 1420 kcal, 60 grams of protein, and 1124 ml of H2O (meets 71% of minimum kcal needs and 60% of minimum protein needs). Goal tube feeding regimen would be 1 carton (237 ml) of Osmolite 1.5 cal tube feeding formula 5 x daily.   - Banatrol TF 60 ml TID per tube   - Ensure Enlive po TID, each supplement provides 350 kcal and 20 grams of protein   - Medical illustrator with whole milk po TID with meals, each supplement provides 290 kcal and 13 grams of protein   - Continue Regular diet per pt request  RD will continue to follow pt during admission.   Gustavus Bryant, MS, RD, LDN Inpatient Clinical Dietitian Please see AMiON for contact information.

## 2022-02-18 DIAGNOSIS — R627 Adult failure to thrive: Secondary | ICD-10-CM | POA: Diagnosis not present

## 2022-02-18 DIAGNOSIS — J189 Pneumonia, unspecified organism: Secondary | ICD-10-CM | POA: Diagnosis not present

## 2022-02-18 DIAGNOSIS — E162 Hypoglycemia, unspecified: Secondary | ICD-10-CM | POA: Diagnosis not present

## 2022-02-18 DIAGNOSIS — I9589 Other hypotension: Secondary | ICD-10-CM | POA: Diagnosis not present

## 2022-02-18 LAB — BASIC METABOLIC PANEL
Anion gap: 5 (ref 5–15)
BUN: 13 mg/dL (ref 6–20)
CO2: 22 mmol/L (ref 22–32)
Calcium: 8.6 mg/dL — ABNORMAL LOW (ref 8.9–10.3)
Chloride: 109 mmol/L (ref 98–111)
Creatinine, Ser: 0.77 mg/dL (ref 0.61–1.24)
GFR, Estimated: >60 mL/min (ref >60)
Glucose, Bld: 93 mg/dL (ref 70–99)
Potassium: 3.1 mmol/L — ABNORMAL LOW (ref 3.5–5.1)
Sodium: 136 mmol/L (ref 135–145)

## 2022-02-18 LAB — CORTISOL: Cortisol, Plasma: 9.6 ug/dL

## 2022-02-18 LAB — MAGNESIUM: Magnesium: 2 mg/dL (ref 1.7–2.4)

## 2022-02-18 MED ORDER — POTASSIUM CHLORIDE 20 MEQ PO PACK
40.0000 meq | PACK | Freq: Every day | ORAL | Status: DC
  Administered 2022-02-18 – 2022-02-19 (×2): 40 meq
  Filled 2022-02-18 (×2): qty 2

## 2022-02-18 MED ORDER — GABAPENTIN 250 MG/5ML PO SOLN
300.0000 mg | Freq: Every day | ORAL | Status: DC
  Administered 2022-02-19 – 2022-02-21 (×3): 300 mg
  Filled 2022-02-18 (×4): qty 6

## 2022-02-18 MED ORDER — LACTATED RINGERS IV SOLN: INTRAVENOUS | Status: DC

## 2022-02-18 MED ORDER — HEPARIN SOD (PORK) LOCK FLUSH 100 UNIT/ML IV SOLN
500.0000 [IU] | INTRAVENOUS | Status: DC | PRN
  Administered 2022-02-18: 500 [IU]
  Filled 2022-02-18 (×2): qty 5

## 2022-02-18 MED ORDER — POTASSIUM CHLORIDE 10 MEQ/100ML IV SOLN
10.0000 meq | INTRAVENOUS | Status: AC
  Administered 2022-02-18 (×4): 10 meq via INTRAVENOUS
  Filled 2022-02-18 (×4): qty 100

## 2022-02-18 MED ORDER — HEPARIN SOD (PORK) LOCK FLUSH 100 UNIT/ML IV SOLN
500.0000 [IU] | INTRAVENOUS | Status: DC
  Filled 2022-02-18: qty 5

## 2022-02-18 NOTE — Progress Notes (Signed)
Pt requesting for PAC to be de accessed.  "I want to sleep on my stomach"  Pt has PIV and no current IV meds x Zofran PRN.  Also noted does not take POrk products due to religious beliefs.  When asked re this, pt states he is allergic to it and cannot eat it.  Asked type of reaction, pt began yelling that its a religious belief and if it is needed for something it is ok to use.  Spoke with pharmacist, Clarene Critchley, who reviewed chart and confirmed hx of heparin products multiple times in the past without reaction.  Deaccessed per request after flushing with heparin flush with pt approval.  Once de accessed, the pt begain insisting on removing the PIVas well.  "Its bothering me and not being used"  Instructed that  the [facility] had ben used and not the PIV, but since the [facility] is now deaccessed per his request, the PIV could be used.  Pt again began raising his voice that he wanted the PIV removed and if something was needed, the PAC could be reaccessed.  Instructed pt the increase risk of infection reaccessing and deaccessing the PAC every time it is needed rather than leaving one or the other in place.  Pt contiues to raise his voice demanding removal of PIV.  Instructed pt I would notify Alei RN for her to have that conversation with him.  Alei RN notified of pt request and temper and interventions already completed.

## 2022-02-18 NOTE — Progress Notes (Signed)
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  Multiple complaints  Feet are numb and falling, three times before coming to the hospital  Coughing  yellow green mucus,for two wks, Feel sob when walk to the kitchen for a week, no chest pain, no fecver, states his oncologist advised him to come to the hospital due to feeling sob  Feeling vision is blurry for 5 days, no eye pain  Subjective:   He continues to reports feeling weak, not feeling better, he reports too weak to stand up, he can not go home yet, he does not want to go to snf. His goal is when he gets stronger, he will go home with home health He requests diet order changed to regular diet, he wants double portion several times despite multiple conversation regarding oral intake is for pleasure mostly in his situation, nutrition per tube is his main nutrition intake,  told him initial CT showed "Dilated and debris filled proximal and mid esophagus without clear visualization of the distal esophageal mass " and advised him be careful with what he choose to eat by mouth. he states he knows what he can keep down, he states he is very careful about choosing foot that can stay down, he states his oncologist told him that  he needs nurtrition to get his weight up to 100lbs to continue chemo   States neurontin helped his numbness , but made him a little sleepy, he would like neurontin at night only, order changed   He refused lovenox injection     Assessment & Plan:  Principal Problem:   Hypotension Active Problems:   Stage IV B Squamous cell esophageal cancer ([place])   Cavitary pneumonia/?? Lung Abscess   Hypokalemia   Protein-calorie malnutrition, severe   AKI  (acute kidney injury) ([place])   Metabolic acidosis   Hypoglycemia   Nausea & vomiting   PEG (percutaneous endoscopic gastrostomy) status (HCC)   FTT (failure to thrive) in adult    Assessment and Plan:   Nausea & vomiting Normal LFTs and total bilirubin. Benign abdominal exam. Suspect likely due to his esophageal malignancy and continued failure to thrive -As needed antiemetics -Continuous IV fluid hydration -diet changed to regular per patient's request, he is made aware that "Dilated and debris filled proximal and mid esophagus " seen on CT   Hypokalemia Hypophosphatemia Hypomagnesemia  Hypoglycemia, initial CBG in the 60s Hypotension Azotemia/metabolic acidosis/mild elevated cr at 1.24, baseline 0.6 to 0.9 Initial  BP around 90/64 due to dehydration -received IV fluid hydration, electrolytes supplement, improving -continue to have orthostatic hypotension,Hold home antihypertensive,  am cortisol 9.6, monitor orthostatic vital signs   Cavitary pneumonia/?? Lung Abscess Previous hospitalization with questionable cavitary pneumonia/lung abscess and underwent bronchoscopy on 10/2.  Has been placed on 4 weeks of Augmentin per pulmonology. -Continue Augmentin  Stage IV B Squamous cell esophageal cancer (HCC) -Follows Dr. Delton Coombes at [facility]  - previously on FOLFOX +Nivolumab -completed 6 cycles on 01/04/2022 but on hold due to continual weight loss -main nutrition  through PEG but continues to take solid by mouth by his preference   PEG (percutaneous endoscopic gastrostomy) status ([place]) He declined osmolite , dietitian consulted for tube feeds adjustment  Peripheral neuropathy Appear has h/o  alcohol use and peripheral neuropathy symptoms proceeds chemotherapy, but symptom got a lot worse after chemo Start neurontin and titrate up  Protein-calorie malnutrition, severe Patient severely cachectic on exam.  Chemotherapy currently on hold due to continual weight  loss. -Continue tube feeds  FTT/ poor prognosis, palliative care consulted for goals of care discussion , remains full code     I have Reviewed nursing notes, Vitals, pain scores, I/o's, Lab results and  imaging results since pt's last encounter, details please see discussion above  I ordered the following labs:  Unresulted Labs (From admission, onward)     Start     Ordered   02/19/22 9470  Basic metabolic panel  Tomorrow morning,   R       Question:  Specimen collection method  Answer:  IV Team=IV Team collect   02/18/22 0739   02/19/22 0500  Phosphorus  Tomorrow morning,   R       Question:  Specimen collection method  Answer:  IV Team=IV Team collect   02/18/22 0739   02/16/22 0500  CBC with Differential/Platelet  Tomorrow morning,   R        02/15/22 0656   02/15/22 0900  Expectorated Sputum Assessment w Gram Stain, Rflx to Resp Cult  Once,   R        02/15/22 0859             DVT prophylaxis: Place TED hose Start: 02/16/22 1743 enoxaparin (LOVENOX) injection 30 mg Start: 02/14/22 2100   Code Status:   Code Status: Full Code  Family Communication: none at bedside ,  Disposition:    Dispo: The patient is from: home alone, He states he used to work at [employer] in [employer], reports lost his job after being sick              Anticipated d/c is to: he is very weak but he is adamant about going home, he does not want to go to rehab              Anticipated d/c date is: too weak, orthostatic hypotension,  he lives by himself, does not have good social support  Antimicrobials:    Anti-infectives (From admission, onward)    Start     Dose/Rate Route Frequency Ordered Stop   02/14/22 2330  amoxicillin-clavulanate (AUGMENTIN) 400-57 MG/5ML suspension 800 mg        800 mg of amoxicillin Per Tube 2 times daily 02/14/22 2058 04/09/22 0959          Objective: Vitals:   02/18/22 0525 02/18/22 0600 02/18/22 0842 02/18/22 1739  BP: (!) 124/91  111/79 (!) 86/65   Pulse: 72  (!) 109 (!) 103  Resp: '17  14 15  '$ Temp: 97.9 F (36.6 C)  98.2 F (36.8 C) 98 F (36.7 C)  TempSrc:      SpO2: 100%  100% (!) 73%  Weight:  41.3 kg    Height:        Intake/Output Summary (Last 24 hours) at 02/18/2022 1743 Last data filed at 02/18/2022 1442 Gross per 24 hour  Intake 1758.82 ml  Output 0 ml  Net 1758.82 ml   Filed Weights   02/15/22 2100 02/16/22 0505 02/18/22 0600  Weight: 38.1 kg 39.8 kg 41.3 kg    Examination:  General exam: alert, awake, communicative,calm, NAD Respiratory system: Clear to auscultation. Respiratory effort normal. Cardiovascular system:  RRR.  Gastrointestinal system: Abdomen is nondistended, soft and nontender.  Normal bowel sounds heard. Central nervous system: Alert and oriented. No focal neurological deficits. Extremities:  no edema Skin: No rashes, lesions or ulcers Psychiatry: Judgement and insight appear normal. Mood & affect appropriate.     Data Reviewed: I have personally reviewed  labs and visualized  imaging studies since the last encounter and formulate the plan        Scheduled Meds:  allopurinol  300 mg Per Tube Daily   amoxicillin-clavulanate  800 mg of amoxicillin Per Tube BID   Chlorhexidine Gluconate Cloth  6 each Topical Daily   enoxaparin (LOVENOX) injection  30 mg Subcutaneous Q24H   feeding supplement  237 mL Oral TID BM   feeding supplement (OSMOLITE 1.5 CAL)  237 mL Per Tube QID   fiber supplement (BANATROL TF)  60 mL Per Tube TID   free water  100 mL Per Tube QID   [START ON 02/19/2022] gabapentin  300 mg Per Tube QHS   guaiFENesin  10 mL Per Tube BID   heparin lock flush  500 Units Intracatheter Q30 days   megestrol  400 mg Per Tube BID   potassium chloride  40 mEq Per Tube Daily   sodium chloride flush  10-40 mL Intracatheter Q12H   sodium chloride flush  3 mL Intravenous Q12H   [START ON 02/20/2022] Vitamin D (Ergocalciferol)  50,000 Units Oral Q Mon   Continuous Infusions:   sodium chloride       LOS: 3 days     Florencia Reasons, MD PhD FACP [facility]  Available via Epic secure chat 7am-7pm for nonurgent issues Please page for urgent issues To page the attending provider between 7A-7P or the covering provider during after hours 7P-7A, please log into the web site [email or web address] and access using universal [place] password for that web site. If you do not have the password, please call the hospital operator.    02/18/2022, 5:43 PM

## 2022-02-18 NOTE — Progress Notes (Signed)
Daily Progress Note   Patient Name: Trevor [name]       Date: 02/18/2022 DOB: [birth year]  Age: 51 y.o. MRN#: [id number] Attending Physician: Florencia Reasons, MD Primary Care Physician: [name], [name] Admit Date: 02/14/2022  Reason for Consultation/Follow-up: Establishing goals of care  Subjective: Medical records reviewed including progress notes, labs. Patient assessed at the bedside.  Reports improvement of his diarrhea.  We are then joined by his brother during our conversation.  Followed up on patient's thoughts regarding advanced directives after yesterday's conversation.  He was unable to read the document, as he did not have his glasses.  His brother has brought them today.  Provided additional education regarding the purpose of HCPOA and living will.  He would likely name his brother as primary HCPOA and his nephew is secondary HCPOA.  He wishes to continue reviewing and will inform PMT if interested in assistance from a notary prior to discharge.  He is worried about being discharged too soon, as he is still having near falls ambulating around his hospital room.  He feels like he may be ready by Monday or Tuesday.  We were then joined by his brother Sande Brothers and I provided introduction of palliative care, updates on patient's current care plan, and emphasized the importance of anticipatory care planning.  We again reviewed patient's recent CT scans.  Reviewed his issues with his diet and food items dilating his esophagus without passing through effectively.  Patient continues to refuse any dietary modifications and wants to continue regular diet.  We also discussed the importance of physical therapy to increase his strength.  I updated patient's brother on our [place] conversation  yesterday.  Patient initially resisted discussion of "dying" but brother wishes to continue discussing given he will be asked to make decisions one day.  He shares that they had to make similar decisions with her mother around 10 years ago.  Patient initially states "I am not going on any machines" but then clarifies after further education that he does want to remain a full code.  Reviewed the risks and benefits, as well as possible trajectories including tracheostomy and requirement of long-term acute care.  He has not thought about this yet, acknowledging that he would wish to avoid facility placement at all costs.  We discussed the importance of  family conversations to ensure family knows how long he would be willing to live this way.  Outpatient palliative care was explained and offered.  Questions and concerns addressed. PMT will continue to support holistically.   Length of Stay: 3   Physical Exam Vitals and nursing note reviewed.  Constitutional:      Appearance: He is cachectic.  Cardiovascular:     Rate and Rhythm: Normal rate.  Pulmonary:     Effort: Pulmonary effort is normal.  Neurological:     Mental Status: He is alert and oriented to person, place, and time.            Vital Signs: BP 111/79 (BP Location: Left Arm)   Pulse (!) 109   Temp 98.2 F (36.8 C)   Resp 14   Ht '5\' 4"'$  (1.626 m)   Wt 41.3 kg   SpO2 100%   BMI 15.63 kg/m  SpO2: SpO2: 100 % O2 Device: O2 Device: Room Air O2 Flow Rate:        Palliative Assessment/Data: 50%    Palliative Care Assessment & Plan   Patient Profile:  51 y.o. 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    Patient has had several admissions in the past 6 months, most recently earlier this month.  Currently admitted for AKI, dehydration.  PMT has been consulted to assist  with goals of care conversation.   Assessment: Nausea and vomiting, improving Metabolic acidosis Orthostatic hypotension Severe protein calorie malnutrition Failure to thrive Stage IV esophageal cancer Goals of care conversation  Recommendations/Plan: Continue full code/full scope treatment Reached out to Dr. Delton Coombes via secure chat at patient's request Patient continues to voice interest in Meals on Wheels and other financial assistance programs.  TOC assistance is appreciated Outpatient palliative care referral at discharge for ongoing goals of care discussions PMT will continue to follow and support as needed   Prognosis: Poor prognosis given ongoing weight loss, functional decline, and inability to receive chemotherapy at this time   Discharge Planning: Home with [place] was discussed with patient    MDM high         [place], PA-C  Palliative Medicine Team Team phone # [phone]  Thank you for allowing the Palliative Medicine Team to assist in the care of this patient. Please utilize secure chat with additional questions, if there is no response within 30 minutes please call the above phone number.  Palliative Medicine Team providers are available by phone from 7am to 7pm daily and can be reached through the team cell phone.  Should this patient require assistance outside of these hours, please call the patient's attending physician.

## 2022-02-19 DIAGNOSIS — E162 Hypoglycemia, unspecified: Secondary | ICD-10-CM | POA: Diagnosis not present

## 2022-02-19 DIAGNOSIS — R627 Adult failure to thrive: Secondary | ICD-10-CM | POA: Diagnosis not present

## 2022-02-19 DIAGNOSIS — I9589 Other hypotension: Secondary | ICD-10-CM | POA: Diagnosis not present

## 2022-02-19 DIAGNOSIS — J189 Pneumonia, unspecified organism: Secondary | ICD-10-CM | POA: Diagnosis not present

## 2022-02-19 LAB — BASIC METABOLIC PANEL
Anion gap: 6 (ref 5–15)
BUN: 13 mg/dL (ref 6–20)
CO2: 19 mmol/L — ABNORMAL LOW (ref 22–32)
Calcium: 9.2 mg/dL (ref 8.9–10.3)
Chloride: 117 mmol/L — ABNORMAL HIGH (ref 98–111)
Creatinine, Ser: 0.71 mg/dL (ref 0.61–1.24)
GFR, Estimated: >60 mL/min (ref >60)
Glucose, Bld: 78 mg/dL (ref 70–99)
Potassium: 3.9 mmol/L (ref 3.5–5.1)
Sodium: 142 mmol/L (ref 135–145)

## 2022-02-19 LAB — PHOSPHORUS: Phosphorus: 2.1 mg/dL — ABNORMAL LOW (ref 2.5–4.6)

## 2022-02-19 MED ORDER — FREE WATER
100.0000 mL | Freq: Four times a day (QID) | Status: DC
  Administered 2022-02-19 – 2022-02-21 (×10): 100 mL

## 2022-02-19 MED ORDER — ENSURE ENLIVE PO LIQD
237.0000 mL | Freq: Three times a day (TID) | ORAL | Status: DC
  Administered 2022-02-19 – 2022-02-21 (×7): 237 mL

## 2022-02-19 MED ORDER — AMOXICILLIN-POT CLAVULANATE 400-57 MG/5ML PO SUSR
800.0000 mg | Freq: Two times a day (BID) | ORAL | Status: DC
  Administered 2022-02-19 – 2022-02-21 (×5): 800 mg
  Filled 2022-02-19 (×8): qty 10

## 2022-02-19 MED ORDER — SODIUM CHLORIDE 0.9% FLUSH
10.0000 mL | INTRAVENOUS | Status: DC | PRN
  Administered 2022-02-21: 10 mL

## 2022-02-19 MED ORDER — SODIUM PHOSPHATES 45 MMOLE/15ML IV SOLN
30.0000 mmol | Freq: Once | INTRAVENOUS | Status: AC
  Administered 2022-02-19: 30 mmol via INTRAVENOUS
  Filled 2022-02-19: qty 10

## 2022-02-19 MED ORDER — LACTATED RINGERS IV SOLN: INTRAVENOUS | Status: DC

## 2022-02-19 MED ORDER — SACCHAROMYCES BOULARDII 250 MG PO CAPS
250.0000 mg | ORAL_CAPSULE | Freq: Two times a day (BID) | ORAL | Status: DC
  Administered 2022-02-19 – 2022-02-21 (×5): 250 mg via ORAL
  Filled 2022-02-19 (×5): qty 1

## 2022-02-19 MED ORDER — BANATROL TF EN LIQD
60.0000 mL | Freq: Three times a day (TID) | ENTERAL | Status: DC
  Administered 2022-02-19 – 2022-02-21 (×7): 60 mL
  Filled 2022-02-19 (×10): qty 60

## 2022-02-19 NOTE — Progress Notes (Signed)
Patient had strong odor of smoke coming from room. Bathroom very smokey. Patient refused for nurse to search room or admit to smoking.Security called for search. Cigarettes removed and locked up in security office. Also open bottle of liquor removed and disposed of by Animal nutritionist. MD Florencia Reasons notified.

## 2022-02-19 NOTE — Plan of Care (Signed)
  Problem: Education: Goal: Knowledge of General Education information will improve Description: Including pain rating scale, medication(s)/side effects and non-pharmacologic comfort measures 02/19/2022 0247 by Maryan Puls, RN Outcome: Progressing 02/19/2022 0246 by Maryan Puls, RN Outcome: Progressing   Problem: Health Behavior/Discharge Planning: Goal: Ability to manage health-related needs will improve 02/19/2022 0247 by Maryan Puls, RN Outcome: Progressing 02/19/2022 0246 by Maryan Puls, RN Outcome: Progressing   Problem: Clinical Measurements: Goal: Ability to maintain clinical measurements within normal limits will improve Outcome: Progressing Goal: Respiratory complications will improve Outcome: Progressing Goal: Cardiovascular complication will be avoided Outcome: Progressing   Problem: Activity: Goal: Risk for activity intolerance will decrease Outcome: Progressing   Problem: Nutrition: Goal: Adequate nutrition will be maintained Outcome: Not Progressing   Problem: Coping: Goal: Level of anxiety will decrease Outcome: Progressing

## 2022-02-19 NOTE — Plan of Care (Signed)
  Problem: Education: Goal: Knowledge of General Education information will improve Description: Including pain rating scale, medication(s)/side effects and non-pharmacologic comfort measures Outcome: Progressing   Problem: Health Behavior/Discharge Planning: Goal: Ability to manage health-related needs will improve Outcome: Progressing   Problem: Activity: Goal: Risk for activity intolerance will decrease Outcome: Progressing   Problem: Nutrition: Goal: Adequate nutrition will be maintained Outcome: Not Progressing   Problem: Coping: Goal: Level of anxiety will decrease Outcome: Progressing

## 2022-02-19 NOTE — Progress Notes (Signed)
Nutrition Follow-up RD working remotely.   DOCUMENTATION CODES:   Underweight, Severe malnutrition in context of chronic illness  INTERVENTION:  - discontinued Osmolite 1.5 order per patient request.  - continue Ensure Plus High Protein TID orally.  - ordered Ensure Plus High Protein TID via PEG (total of 6/day provides 2100 kcal and 120 grams protein).  - continue banatrol TF TID via PEG.  - continue 100 ml free water via PEG QID.  - communicated with RD who saw patient in person on 02/16/22.  - recommend probiotic--secure chat sent to MD, awaiting reply.   NUTRITION DIAGNOSIS:   Severe Malnutrition related to chronic illness (stage IV esophageal cancer with mets) as evidenced by severe fat depletion, severe muscle depletion, percent weight loss (47.8% weight loss since 05/30/21). -ongoing  GOAL:   Patient will meet greater than or equal to 90% of their needs -not consistently met  MONITOR:   PO intake, TF tolerance, Supplement acceptance, Labs, Weight trends, I & O's  REASON FOR ASSESSMENT:   Consult Enteral/tube feeding initiation and management (He states he wants the ensure/boost, but does not want osmolite, he likes the banatrol)  ASSESSMENT:   51 year old male who presented to the ED on 10/24 with chest pain, weakness. PMH of stage IV B squamous cell esophageal cancer with metastasis s/p open G-tube placement (followed by [place] RD), microcytic anemia, prior EtOH and tobacco abuse.  Able to talk with patient on the phone and talk with RN and MD via secure chat.   Patient has a PEG from 09/22/21. He was being followed by a RD at the [facility] and was seen in person by a RD on 02/16/22. Notes reviewed, specifically relating to tube feeding and reported intolerances in the past, adjustments made to aid in increasing acceptance of bolus tube feeds via PEG.  Patient confirms that he does not want to receive Osmolite 1.5 via PEG and that he only wants  Ensure or Boost both orally and via PEG. He shares that is drinking Ensure during phone call with RD.   Review of order for Ensure indicates he has accepted all bottles since order was placed.   Patient shares that he will need banatrol 6 times/day so that it can be provided along with all bottles of Ensure to prevent diarrhea. Shared with patient that there is currently no evidence to support utilizing banatrol x6/day.   During phone call, patient asked several times why this RD was calling rather than seeing him in person. Patient requests to know when a RD will see him in person; informed him that this RD would pass along request to RD who covers the floor. He shares that he wants to receive all information that will aid in prolonging his life prior to being discharged from the hospital.   Weight yesterday was 91 lb which is +7 lb compared to weight on 10/25. No information documented in the edema section of flow sheet this admission.    Labs reviewed; Cl: 117 mmol/l.  Medications reviewed; 400 mg megace per tube BID, 59935 units drisdol every Monday starting 10/30.   Diet Order:   Diet Order             Diet regular Room service appropriate? Yes; Fluid consistency: Thin  Diet effective now                   EDUCATION NEEDS:   Education needs have been addressed  Skin:  Skin Assessment: Reviewed RN  Assessment  Last BM:  10/29 x2, both type 4-5 per patient report  Height:   Ht Readings from Last 1 Encounters:  02/16/22 _0  (1.626 m)    Weight:   Wt Readings from Last 1 Encounters:  02/18/22 41.3 kg     BMI:  Body mass index is 15.63 kg/m.  Estimated Nutritional Needs:  Kcal:  2000-2200 Protein:  100-115 grams Fluid:  >2.0 L     Trevor Matin, MS, RD, LDN, CNSC Clinical Dietitian PRN/Relief staff On-call/weekend pager # available in [facility]

## 2022-02-19 NOTE — Progress Notes (Signed)
Daily Progress Note   Patient Name: Trevor [name]       Date: 02/19/2022 DOB: [birth year]  Age: 51 y.o. MRN#: [id number] Attending Physician: Florencia Reasons, MD Primary Care Physician: [name], [name] Admit Date: 02/14/2022  Reason for Consultation/Follow-up: Establishing goals of care  Subjective: Medical records reviewed including progress notes, labs. Patient assessed at the bedside.  Reports feeling well, no acute concerns.  No family present during my visit.  Patient reports he has not reviewed advanced directives, although he has his glasses now.  I provided him with update that I reached out to his oncologist at his request, who says he will be glad to speak with him tomorrow.  I informed him that it appears radiation would only make him weaker, which he adamantly disagrees with.  He states he has been reading a lot about both chemotherapy and radiation.  I gently informed him that his oncologist has stated it would be appropriate to consider hospice at this point.  He again states "I already have hospice" and I provided additional education and counseling on the difference between home health and hospice.  He then states that he wants both services and I clarified why this is not feasible.  He becomes upset with me, stating that "all you want to do is talk about dying" and I provided reassurance and emotional support/therapeutic listening.  Explained to patient that it is important for him to know the difference between home health and hospice, as these are very different philosophies.  Discussed that this information is being provided to empower him to make his own decisions.  He continues to feel strongly that he is open to anything to prolong his life, stating "I am not dying its only in my  esophagus."  Strongly encouraged patient to consider discussing potential worst-case scenarios and anticipatory care planning.  He refuses at this time.  He wants to know why social work does not come by yet to discuss his concerns regarding paying his bills and receiving meal assistance.  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  He is agreeable to this PA returning for further conversation when I am back on service on Wednesday.  Questions and concerns addressed. PMT will continue to support holistically.   Length of Stay: 4   Physical  Exam Vitals and nursing note reviewed.  Constitutional:      Appearance: He is cachectic.  Cardiovascular:     Rate and Rhythm: Normal rate.  Pulmonary:     Effort: Pulmonary effort is normal.  Neurological:     Mental Status: He is alert and oriented to person, place, and time.            Vital Signs: BP 105/80 (BP Location: Left Arm)   Pulse 97   Temp 98.2 F (36.8 C) (Oral)   Resp 18   Ht '5\' 4"'$  (1.626 m)   Wt 41.3 kg   SpO2 100%   BMI 15.63 kg/m  SpO2: SpO2: 100 % O2 Device: O2 Device: Room Air O2 Flow Rate:        Palliative Assessment/Data: 50%    Palliative Care Assessment & Plan   Patient Profile:  51 y.o. 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    Patient has had several admissions in the past 6 months, most recently earlier this month.  Currently admitted for AKI, dehydration.  PMT has been consulted to assist with goals of care conversation.   Assessment: Nausea and vomiting, improving Metabolic acidosis Orthostatic hypotension Severe protein calorie malnutrition Failure to thrive Stage IV esophageal cancer Goals of care conversation  Recommendations/Plan: Continue full code/full scope treatment Dr. Delton Coombes will discuss options  further with patient tomorrow Reached out to [facility] via secure chat at patient request to discuss assistance programs Outpatient palliative care referral at discharge for ongoing goals of care discussions PMT will follow-up on 11/1.  Please call team line or secure chat with urgent needs   Prognosis: Poor prognosis given ongoing weight loss, functional decline, and inability to receive chemotherapy at this time   Discharge Planning: Home with [place] was discussed with patient    MDM high         [place], PA-C  Palliative Medicine Team Team phone # [phone]  Thank you for allowing the Palliative Medicine Team to assist in the care of this patient. Please utilize secure chat with additional questions, if there is no response within 30 minutes please call the above phone number.  Palliative Medicine Team providers are available by phone from 7am to 7pm daily and can be reached through the team cell phone.  Should this patient require assistance outside of these hours, please call the patient's attending physician.

## 2022-02-19 NOTE — Progress Notes (Addendum)
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  Multiple complaints  Feet are numb and falling, three times before coming to the hospital  Coughing  yellow green mucus,for two wks, Feel sob when walk to the kitchen for a week, no chest pain, no fecver, states his oncologist advised him to come to the hospital due to feeling sob  Feeling vision is blurry for 5 days, no eye pain  Subjective:   He continues to reports feeling weak,  he reports too weak to stand up, he can not go home yet, he does not want to go to snf. His goal is when he gets stronger, he will go home with home health  He continue to demand regular diet with double portion, I have observed he eats then he spit up/vomits ,  I reviewed with him the initial CT showed "Dilated and debris filled proximal and mid esophagus without clear visualization of the distal esophageal mass " and advised him be careful with what he choose to eat by mouth. Continue  to educate patient to avoid regular diet, I explained to him regular diet is for pleasure mostly in his situation, liquid food by mouth would be ok but won't meet his nutrition needs. nutrition per tube is his main nutrition intake,    he states he knows what he can keep down, he states he is very careful about choosing foot that can stay down, he states his oncologist told him that  he needs nurtrition to get his weight up to 100lbs to continue chemo   He refused lovenox injection     Assessment & Plan:  Principal Problem:   Hypotension Active Problems:   Stage IV B Squamous cell esophageal cancer (HCC)   Cavitary pneumonia/?? Lung Abscess   Hypokalemia   Protein-calorie malnutrition, severe   AKI  (acute kidney injury) ([place])   Metabolic acidosis   Hypoglycemia   Nausea & vomiting   PEG (percutaneous endoscopic gastrostomy) status (HCC)   FTT (failure to thrive) in adult    Assessment and Plan:   Nausea & vomiting Normal LFTs and total bilirubin. Benign abdominal exam. Suspect likely due to his esophageal malignancy and continued failure to thrive -As needed antiemetics -Continuous IV fluid hydration -diet changed to regular per patient's request, he is made aware that "Dilated and debris filled proximal and mid esophagus " seen on CT   Hypokalemia, replaced Hypophosphatemia, replaced  Hypomagnesemia , replaced Hypoglycemia, initial CBG in the 60s,. Improved  Hypotension/orthostatic hypotension Azotemia/metabolic acidosis/mild elevated cr at 1.24, baseline 0.6 to 0.9 Initial  supine BP around 90/64 on presentation , continue to have significant orthostatic hypotension, not able to stand up to finish orthostatic vital sings.  --Hold home antihypertensive,  am cortisol 9.6, continue IV fluid hydration  Cavitary pneumonia/?? Lung Abscess Previous hospitalization with questionable cavitary pneumonia/lung abscess and underwent bronchoscopy on 10/2.  Has been placed on 4 weeks of Augmentin  ( from 10/4)per pulmonology. -Continue Augmentin to finish course   Stage IV B Squamous cell esophageal cancer (HCC) -Follows Dr. Delton Coombes at [facility]  - previously on FOLFOX +Nivolumab -completed 6 cycles on 01/04/2022 but on hold due to continual weight loss -main nutrition  through PEG but continues to take solid  by mouth by his preference   PEG (percutaneous endoscopic gastrostomy) status ([place]) He declined osmolite , dietitian consulted for tube feeds adjustment  Peripheral neuropathy Appear has h/o alcohol use and peripheral neuropathy symptoms proceeds chemotherapy, but symptom got a lot worse after chemo States neurontin helped his numbness , but made him a little  sleepy, he would like neurontin at night only Started neurontin and titrate up  Protein-calorie malnutrition, severe Patient severely cachectic on exam.  Chemotherapy currently on hold due to continual weight loss. -Continue tube feeds  FTT/ poor prognosis, palliative care consulted for goals of care discussion , remains full code    PM addendum:  Unfortunately patient is found to  smoke in room and has open bottle of liquor in his room, please see RN note.  I have Reviewed nursing notes, Vitals, pain scores, I/o's, Lab results and  imaging results since pt's last encounter, details please see discussion above  I ordered the following labs:  Unresulted Labs (From admission, onward)     Start     Ordered   02/20/22 4481  Basic metabolic panel  Tomorrow morning,   R       Question:  Specimen collection method  Answer:  IV Team=IV Team collect   02/19/22 1610   02/20/22 0500  Phosphorus  Tomorrow morning,   R       Question:  Specimen collection method  Answer:  IV Team=IV Team collect   02/19/22 1610   02/16/22 0500  CBC with Differential/Platelet  Tomorrow morning,   R        02/15/22 0656   02/15/22 0900  Expectorated Sputum Assessment w Gram Stain, Rflx to Resp Cult  Once,   R        02/15/22 0859             DVT prophylaxis: Place TED hose Start: 02/16/22 1743 enoxaparin (LOVENOX) injection 30 mg Start: 02/14/22 2100   Code Status:   Code Status: Full Code  Family Communication: none at bedside  Disposition:    Dispo: The patient is from: home alone, He states he used to work at [employer] in [employer], reports lost his job after being sick              Anticipated d/c is to: he is very weak but he is adamant about going home, he does not want to go to rehab              Anticipated d/c date is: too weak to stand up, continue to have orthostatic hypotension,  he lives by himself, does not have good social support  Antimicrobials:    Anti-infectives (From  admission, onward)    Start     Dose/Rate Route Frequency Ordered Stop   02/14/22 2330  amoxicillin-clavulanate (AUGMENTIN) 400-57 MG/5ML suspension 800 mg        800 mg of amoxicillin Per Tube 2 times daily 02/14/22 2058 04/09/22 0959          Objective: Vitals:   02/18/22 1747 02/18/22 2106 02/19/22 0617 02/19/22 0947  BP: 101/67 95/77 108/72 99/70  Pulse: (!) 101 100 94 100  Resp:  '18 18 19  '$ Temp:  98.4 F (36.9 C) 98.3 F (36.8 C) 97.9 F (36.6 C)  TempSrc:  Oral Oral Oral  SpO2: 100% 100% 100%   Weight:      Height:        Intake/Output Summary (Last 24 hours) at 02/19/2022 1633 Last  data filed at 02/19/2022 0619 Gross per 24 hour  Intake 0 ml  Output 0 ml  Net 0 ml   Filed Weights   02/15/22 2100 02/16/22 0505 02/18/22 0600  Weight: 38.1 kg 39.8 kg 41.3 kg    Examination:  General exam: alert, awake, communicative,calm, NAD Respiratory system: Clear to auscultation. Respiratory effort normal. Cardiovascular system:  RRR.  Gastrointestinal system: Abdomen is nondistended, soft and nontender.  Normal bowel sounds heard. + peg tube Central nervous system: Alert and oriented. No focal neurological deficits. Extremities:  no edema Skin: No rashes, lesions or ulcers Psychiatry: Judgement and insight appear normal. Mood & affect appropriate.     Data Reviewed: I have personally reviewed  labs and visualized  imaging studies since the last encounter and formulate the plan        Scheduled Meds:  allopurinol  300 mg Per Tube Daily   amoxicillin-clavulanate  800 mg of amoxicillin Per Tube BID   Chlorhexidine Gluconate Cloth  6 each Topical Daily   enoxaparin (LOVENOX) injection  30 mg Subcutaneous Q24H   feeding supplement  237 mL Oral TID BM   feeding supplement  237 mL Per Tube TID BM   fiber supplement (BANATROL TF)  60 mL Per Tube TID   free water  100 mL Per Tube QID   gabapentin  300 mg Per Tube QHS   guaiFENesin  10 mL Per Tube BID   heparin  lock flush  500 Units Intracatheter Q30 days   megestrol  400 mg Per Tube BID   potassium chloride  40 mEq Per Tube Daily   saccharomyces boulardii  250 mg Oral BID   sodium chloride flush  10-40 mL Intracatheter Q12H   sodium chloride flush  3 mL Intravenous Q12H   [START ON 02/20/2022] Vitamin D (Ergocalciferol)  50,000 Units Oral Q Mon   Continuous Infusions:  sodium chloride     sodium phosphate 30 mmol in dextrose 5 % 250 mL infusion       LOS: 4 days     Florencia Reasons, MD PhD FACP [facility]  Available via Epic secure chat 7am-7pm for nonurgent issues Please page for urgent issues To page the attending provider between 7A-7P or the covering provider during after hours 7P-7A, please log into the web site [email or web address] and access using universal [place] password for that web site. If you do not have the password, please call the hospital operator.    02/19/2022, 4:33 PM

## 2022-02-20 ENCOUNTER — Other Ambulatory Visit: Payer: 59

## 2022-02-20 ENCOUNTER — Ambulatory Visit: Payer: 59

## 2022-02-20 ENCOUNTER — Ambulatory Visit: Payer: 59 | Admitting: Hematology

## 2022-02-20 DIAGNOSIS — J189 Pneumonia, unspecified organism: Secondary | ICD-10-CM | POA: Diagnosis not present

## 2022-02-20 DIAGNOSIS — C159 Malignant neoplasm of esophagus, unspecified: Secondary | ICD-10-CM | POA: Diagnosis not present

## 2022-02-20 DIAGNOSIS — E162 Hypoglycemia, unspecified: Secondary | ICD-10-CM | POA: Diagnosis not present

## 2022-02-20 DIAGNOSIS — N179 Acute kidney failure, unspecified: Secondary | ICD-10-CM | POA: Diagnosis not present

## 2022-02-20 LAB — BASIC METABOLIC PANEL
Anion gap: 6 (ref 5–15)
BUN: 13 mg/dL (ref 6–20)
CO2: 20 mmol/L — ABNORMAL LOW (ref 22–32)
Calcium: 9.2 mg/dL (ref 8.9–10.3)
Chloride: 120 mmol/L — ABNORMAL HIGH (ref 98–111)
Creatinine, Ser: 0.85 mg/dL (ref 0.61–1.24)
GFR, Estimated: >60 mL/min (ref >60)
Glucose, Bld: 95 mg/dL (ref 70–99)
Potassium: 3.7 mmol/L (ref 3.5–5.1)
Sodium: 146 mmol/L — ABNORMAL HIGH (ref 135–145)

## 2022-02-20 LAB — PHOSPHORUS: Phosphorus: 5.1 mg/dL — ABNORMAL HIGH (ref 2.5–4.6)

## 2022-02-20 MED ORDER — LACTATED RINGERS IV SOLN: INTRAVENOUS | Status: AC

## 2022-02-20 NOTE — TOC Progression Note (Addendum)
Transition of Care [facility]) - Progression Note    Patient Details  Name: Trevor [name] MRN: [id number] Date of Birth: 1970/08/25  [facility] CM/SW Contact  Bartholomew Crews, RN Phone Number: [phone] 02/20/2022, 1:23 PM  Clinical Narrative:     Spoke with patient at the bedside to discuss post acute transition. Discussed recommendations for rollator - patient agreeable. No preference for provider. Referral to [facility] for delivery to the room prior to discharge. Patient asked about getting his feedings. .Notified by [employer] that they are OON with patient's insurance and had to decline referral for TF. TOC following for transition needs.   Update: Notified by liaison at [facility] that patient was not eligible for rollator stating that he has recently received cane, crutches, and RW. [employer] to discuss with patient and offer private pay option.   Update: Patient declined rollator d/t not eligible under insurance.  Confirmed with [employer] that patient order TF supplies on 10/6.  Expected Discharge Plan: [place] Barriers to Discharge: Continued Medical Work up  Expected Discharge Plan and Services Expected Discharge Plan: [place]   Discharge Planning Services: CM Consult   Living arrangements for the past 2 months: [place]: [place] Date [place]: 02/17/22 Time [place]: [id number] Representative spoke with at [place]: [place] ([place]) Interventions    Readmission Risk Interventions    01/24/2022    3:13 PM  Readmission Risk Prevention Plan  Transportation Screening Complete  PCP or Specialist Appt within 3-5 Days Complete  HRI or [place] Complete  Social Work Consult for [place] Planning/Counseling Complete  Palliative Care Screening Complete  Medication Review Press photographer) Complete

## 2022-02-20 NOTE — Progress Notes (Signed)
Physical Therapy Treatment Patient Details Name: Trevor [name] MRN: [id number] DOB: Nov 16, 1970 Today's Date: 02/20/2022   History of Present Illness 51 y.o. male presents to [facility] hospital on 02/14/2022 with persistent nausea and vomiting. Pt found to be hypotensive on arrival to ED. Pt also with multiple falls recently. PMH includes DM, stg 4 esophageal cancer, HTN.    PT Comments    Continuing work on functional mobility and activity tolerance;  session focused on monitoring for syncopal symptoms and activity tolerance during functional ambulation; Also trialed amb with rollator RW to give pt the opportunity to sit down either at the earliest signs of dizziness, or to sit prior to any notion of pre-syncopal symptoms; for example: today he walked approx 60 ft before needing to sit because of symptoms; we discussed choosing to half that distance, and take a seated break, whether or not symptoms are present; Before we began back to his room, we took another seated BP, which was quite low at 65/56 -- so we opted to have pt stay seated in rollator and push himself back to the room    02/20/22 1205 02/20/22 1210 02/20/22 1227  Orthostatic Sitting  BP- Sitting (!) 88/70 (MAP 77) (!) 65/56 (MAP 61) 93/68 (76)  Pulse- Sitting 143 (immediately after walking) 131 (after a few minutes of upright sitting after the walk) 149 (Seated EOB after pushing self backwards to room while seated in rollator)       Recommendations for follow up therapy are one component of a multi-disciplinary discharge planning process, led by the attending physician.  Recommendations may be updated based on patient status, additional functional criteria and insurance authorization.  Follow Up Recommendations  Home health PT     Assistance Recommended at Discharge PRN  Patient can return home with the following A little help with walking and/or transfers;A little help with bathing/dressing/bathroom;Assistance with  cooking/housework;Assist for transportation;Help with stairs or ramp for entrance   Equipment Recommendations  Rollator (4 wheels);BSC/3in1    Recommendations for Other Services       Precautions / Restrictions Precautions Precautions: Fall Precaution Comments: history of syncope     Mobility  Bed Mobility Overal bed mobility: Needs Assistance Bed Mobility: Supine to Sit     Supine to sit: Supervision     General bed mobility comments: No LOB forward when gettign to teh EOB today    Transfers Overall transfer level: Needs assistance Equipment used: Rollator (4 wheels) Transfers: Sit to/from Stand Sit to Stand: Min guard           General transfer comment: min guard for safety; cues for setting brakes prior to getting up and sitting down to rollaotr seat    Ambulation/Gait Ambulation/Gait assistance: Min guard, +2 safety/equipment Gait Distance (Feet): 60 Feet Assistive device: Rollator (4 wheels) Gait Pattern/deviations: Step-through pattern       General Gait Details: Near constant cueing to self-monitor for activity tolerance; pt self-identified that he needed to sit due to dizziness after about 60 ft   Stairs             Wheelchair Mobility    Modified Rankin (Stroke Patients Only)       Balance     Sitting balance-Leahy Scale: Good       Standing balance-Leahy Scale: Poor                              Cognition Arousal/Alertness: Awake/alert Behavior During Therapy:  WFL for tasks assessed/performed Overall Cognitive Status: Within Functional Limits for tasks assessed                                          Exercises Other Exercises Other Exercises: Seated in rollator, knee extension exercise, pushing self backwards (back to the room)    General Comments General comments (skin integrity, edema, etc.): Cues to self-monitor for activity tolerance       Pertinent Vitals/Pain Pain Assessment Pain  Assessment: No/denies pain    Home Living                          Prior Function            PT Goals (current goals can now be found in the care plan section) Acute Rehab PT Goals Patient Stated Goal: to stop falling, improve tolerance for ambulation PT Goal Formulation: With patient Time For Goal Achievement: 03/02/22 Potential to Achieve Goals: Poor Progress towards PT goals: Progressing toward goals    Frequency    Min 3X/week      PT Plan Current plan remains appropriate    Co-evaluation              AM-PAC PT "6 Clicks" Mobility   Outcome Measure  Help needed turning from your back to your side while in a flat bed without using bedrails?: A Little Help needed moving from lying on your back to sitting on the side of a flat bed without using bedrails?: A Little Help needed moving to and from a bed to a chair (including a wheelchair)?: A Little Help needed standing up from a chair using your arms (e.g., wheelchair or bedside chair)?: A Little Help needed to walk in hospital room?: A Little Help needed climbing 3-5 steps with a railing? : A Lot 6 Click Score: 17    End of Session Equipment Utilized During Treatment: Other (comment) (pt's own belt) Activity Tolerance: Other (comment) (limited by hypotension) Patient left: in bed;with call bell/phone within reach Nurse Communication: Mobility status PT Visit Diagnosis: Other abnormalities of gait and mobility (R26.89);Muscle weakness (generalized) (M62.81);History of falling (Z91.81)     Time: 1203-1221 PT Time Calculation (min) (ACUTE ONLY): 18 min  Charges:  $Gait Training: 8-22 mins                     Roney Marion, [place] Office [phone]    Colletta [place] 02/20/2022, 2:09 PM

## 2022-02-20 NOTE — Hospital Course (Addendum)
Patient is a 51 years old male with past medical history of stage IV B squamous cell esophageal cancer with metastasis,PEG tube dependency, microcytic anemia presented to hospital with multiple complaints including nausea and vomiting and abdominal pain around the PEG tube area, cough with greenish productive sputum for 2 weeks and generalized weakness.Marland Kitchen  He also had tingling of his feet, blurry vision and falling few times prior to coming to the hospital.  Of note patient was recently hospitalized between 01/21/2022 to 01/25/2022 with progressive weakness fatigue and shortness of breath and was treated with steroids and antibiotics.  Also had a bronchoscopy for possible lung abscess on 01/23/2022.  On this admission patient had mild tachycardia and was hypotensive.  He had mild leukocytosis with WBC at 12.3.  Chemistry showed a potassium low at 3.2 and creatinine at 1.2.  Feet are numb and falling, three times before coming to the hospital.  Patient was advised by his oncologist to come to the hospital for these symptoms.

## 2022-02-20 NOTE — Progress Notes (Addendum)
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  Of note, patient was recently hospitalized between 01/21/2022 to 01/25/2022 with progressive weakness fatigue and shortness of breath and was treated with steroids and antibiotics. Also had a bronchoscopy for possible lung abscess on 01/23/2022.   On this admission, patient had mild tachycardia and was hypotensive.  He had mild leukocytosis with WBC at 12.3.  Chemistry showed a potassium low at 3.2 and creatinine at 1.2.   Patient was advised by his oncologist to come to the hospital for these symptoms.  Assessment and Plan:   Principal Problem:   Hypotension Active Problems:   Stage IV B Squamous cell esophageal cancer (HCC)   Cavitary pneumonia/?? Lung Abscess   Hypokalemia   Protein-calorie malnutrition, severe   AKI (acute kidney injury) (HCC)   Metabolic acidosis   Hypoglycemia   Nausea & vomiting   PEG (percutaneous endoscopic gastrostomy) status (HCC)   FTT (failure to thrive) in adult    Nausea & vomiting Abnormal LFTs.  Thought to be secondary to esophageal malignancy and failure to thrive.  Continue antiemetics IV fluids.  Dilated and debris-filled proximal and midesophagus on CT scan.  Diet has been changed to regular as per patient's request.  Patient has been on tube feedings.  Complains of mild vomiting but has lot of food sitting at the bedside.  Counseled about it.  Hypokalemia  replenished and improved.  Hypophosphatemia, replaced, stress level on the higher side today.  Monitor in AM.  Hypomagnesemia , replaced.  Latest magnesium of 2.0.  Hypoglycemia, improved at this time.  Hypotension/orthostatic hypotension Received continue to hold antihypertensives.  Cortisol at 9.6.  It is still orthostatic.  We will continue with Ringer lactate 125 mill per hour.  Acute kidney injury.  Was hypotensive.  Continue IV fluids.  Hold antihypertensives.  Improved at this time at 0.7.   Cavitary pneumonia with history of recent lung Abscess Previous hospitalization with questionable cavitary pneumonia/lung abscess and underwent bronchoscopy on 01/23/22.  Currently on 4 weeks of Augmentin starting 01/25/2022.  Continue Augmentin to complete course.  Patient was counseled regarding quitting smoking.  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 Delton [facility]ombes at [facility].  Patient was previously on FOLFOX +Nivolumab -completed 6 cycles on 01/04/2022 but on hold due to continual weight loss.  Continue with PEG tube feeding.  Patient however continues to take solid by mouth as per his preference.  Patient has been seen by palliative care and has poor prognosis.  PEG (percutaneous endoscopic gastrostomy) status (HCC) Declined Osmolite.  Continue Ensure.  Dietitian on board.   Peripheral neuropathy History of alcoholic neuropathy.  On Neurontin at night.  Can titrate as necessary.  Protein-calorie malnutrition, severe less failure to thrive Present on admission, patient was severely cachectic on presentation..  Chemotherapy on hold due to severe failure to thrive.  Continue tube feeds.  Palliative care has been consulted for goals of care.  Ongoing  smoking and alcohol issues.  Found in his room on 02/19/2022.  Patient was counseled about it.        DVT prophylaxis: Place and maintain sequential compression device Start: 02/20/22 0237 Place TED hose Start:  02/16/22 1743 enoxaparin (LOVENOX) injection 30 mg Start: 02/14/22 2100   Code Status:     Code Status: Full Code  Disposition: Home with home health likely 02/21/2022  Status is: Inpatient Remains inpatient appropriate because: Multiple symptoms, ambulatory dysfunction,   Family Communication: Spoke with the patient at bedside  Consultants:  Dietitian,  palliative care  Procedures:  None  Antimicrobials:  Augmentin until 02-23-22  Anti-infectives (From admission, onward)    Start     Dose/Rate Route Frequency Ordered Stop   02/19/22 2200  amoxicillin-clavulanate (AUGMENTIN) 400-57 MG/5ML suspension 800 mg        800 mg of amoxicillin Per Tube 2 times daily 02/19/22 2109 02/23/22 2159   02/14/22 2330  amoxicillin-clavulanate (AUGMENTIN) 400-57 MG/5ML suspension 800 mg  Status:  Discontinued        800 mg of amoxicillin Per Tube 2 times daily 02/14/22 2058 02/19/22 2109       Subjective: Today, patient was seen and examined at bedside.  Patient complains of fatigue weakness difficulty ambulation.  Has nausea and episode of vomiting.  Complains of dizziness.  Has a lot of food at bedside trying to eat solid consistency.  Complains of pain in the stomach and legs as well.  Does not feel ready for discharge.  Objective: Vitals:   02/19/22 1705 02/19/22 2141 02/20/22 0517 02/20/22 0956  BP: 105/80 109/85 103/64 112/81  Pulse: 97 93 88 93  Resp: '18 18 18   '$ Temp: 98.2 F (36.8 C) 98.3 F (36.8 C) 98.4 F (36.9 C) 97.6 F (36.4 C)  TempSrc: Oral Oral  Oral  SpO2: 100% 100% 100% 100%  Weight:      Height:        Intake/Output Summary (Last 24 hours) at 02/20/2022 1048 Last data filed at 02/20/2022 0900 Gross per 24 hour  Intake 1345.05 ml  Output 0 ml  Net 1345.05 ml   Filed Weights   02/15/22 2100 02/16/22 0505 02/18/22 0600  Weight: 38.1 kg 39.8 kg 41.3 kg    Physical Examination: Body mass index is 15.63 kg/m.   General: Thinly built, not in obvious  distress HENT:   No scleral pallor or icterus noted. Oral mucosa is moist.  Chest:  Clear breath sounds.  Diminished breath sounds bilaterally. No crackles or wheezes.  CVS: S1 &S2 heard. No murmur.  Regular rate and rhythm. Abdomen: Soft, nontender, nondistended.  Bowel sounds are heard.  PEG tube in place. Extremities: No cyanosis, clubbing or edema.  Peripheral pulses are palpable. Psych: Alert, awake and oriented, mildly anxious. CNS:  No cranial nerve deficits.  Power equal in all extremities.   Skin: Warm and dry.  No rashes noted.  Data Reviewed:   CBC: Recent Labs  Lab 02/14/22 1158 02/15/22 0427 02/16/22 1015 02/17/22 0326  WBC 12.3* 10.8* 7.6 7.9  NEUTROABS  --   --  5.2 5.4  HGB 11.9* 9.2* 8.8* 8.9*  HCT 36.6* 27.5* 26.2* 27.1*  MCV 78.9* 79.0* 78.7* 78.6*  PLT 256 219 166 811    Basic Metabolic Panel: Recent Labs  Lab 02/15/22 1700 02/16/22 0454 02/17/22 0326 02/18/22 0210 02/19/22 0321 02/20/22 0228  NA 138 138 138 136 142 146*  K 4.6 4.2 3.1* 3.1* 3.9 3.7  CL 108  109 108 109 117* 120*  CO2 '22 22 22 22 '$ 19* 20*  GLUCOSE 89 79 72 93 78 95  BUN 29* 26* '17 13 13 13  '$ CREATININE 1.05 0.91 0.74 0.77 0.71 0.85  CALCIUM 9.5 9.3 8.9 8.6* 9.2 9.2  MG 1.8 1.8 1.8 2.0  --   --   PHOS  --  1.8* 2.7  --  2.1* 5.1*    Liver Function Tests: Recent Labs  Lab 02/14/22 1158  AST 27  ALT 11  ALKPHOS 81  BILITOT 0.9  PROT 7.9  ALBUMIN 3.1*     Radiology Studies: No results found.    LOS: 5 days    Flora Lipps, MD [facility] Available via Epic secure chat 7am-7pm After these hours, please refer to coverage provider listed on [email or web address] 02/20/2022, 10:48 AM

## 2022-02-20 NOTE — Consult Note (Signed)
   [facility] CM Inpatient Consult   02/20/2022  Trevor [name] 01-19-1971 [id number]   [facility] [[facility]] Patient: Trevor [name]   Primary Care Provider: [name], [name] with [facility] Primary Care is an Independent Embedded provider with a Care Management team and program and is listed for the [facility] follow up needs  Patient is showing as active with the [place] team with [place] and has had [place] noted in encounters for follow up on needs.Continue to explain this writer's relationship with his Care Coordination team.   11:30 am met with the patient at the bedside and explained reason for the visit.  Patient a little edgy with comments at first. He states he will continue to work with the CM team.    Patient screened for less than 30 day readmission hospitalization with noted high risk score for unplanned readmission.   Review of patient's electronic medical record MD progress notes, inpatient TOC notes reveals patient is in need on ongoing assistance with understanding disease process and care needs. Patient states, "I am not leaving from where I stay, why ya'll trying to get me to go some where else."  Explained to patient he had verbalized concerns for the community team to follow up on.  He states, "well I'm alright right now." Requested a cup of ice, check with staff and gave ice as he was eating, with soda, large bags of chips, and sandwiches on his tables."   Plan:  Continue to follow progress and disposition to assess for post hospital care management needs.  Will update [facility] CC team of hospitalization and TOC follow up needs.   For questions contact:    Natividad Brood, RN BSN [place]  [phone] business mobile phone Toll free office [phone]  *[place]  [phone] Fax number:  [phone] [email or web address]    .

## 2022-02-21 ENCOUNTER — Ambulatory Visit: Payer: Self-pay | Admitting: *Deleted

## 2022-02-21 DIAGNOSIS — J189 Pneumonia, unspecified organism: Secondary | ICD-10-CM | POA: Diagnosis not present

## 2022-02-21 DIAGNOSIS — N179 Acute kidney failure, unspecified: Secondary | ICD-10-CM | POA: Diagnosis not present

## 2022-02-21 DIAGNOSIS — E162 Hypoglycemia, unspecified: Secondary | ICD-10-CM | POA: Diagnosis not present

## 2022-02-21 DIAGNOSIS — C159 Malignant neoplasm of esophagus, unspecified: Secondary | ICD-10-CM | POA: Diagnosis not present

## 2022-02-21 LAB — CBC
HCT: 26.8 % — ABNORMAL LOW (ref 39.0–52.0)
Hemoglobin: 8.7 g/dL — ABNORMAL LOW (ref 13.0–17.0)
MCH: 26.4 pg (ref 26.0–34.0)
MCHC: 32.5 g/dL (ref 30.0–36.0)
MCV: 81.2 fL (ref 80.0–100.0)
Platelets: 160 10*3/uL (ref 150–400)
RBC: 3.3 MIL/uL — ABNORMAL LOW (ref 4.22–5.81)
RDW: 22.4 % — ABNORMAL HIGH (ref 11.5–15.5)
WBC: 17.8 10*3/uL — ABNORMAL HIGH (ref 4.0–10.5)
nRBC: 0 % (ref 0.0–0.2)

## 2022-02-21 LAB — BASIC METABOLIC PANEL
Anion gap: 9 (ref 5–15)
BUN: 16 mg/dL (ref 6–20)
CO2: 21 mmol/L — ABNORMAL LOW (ref 22–32)
Calcium: 9.1 mg/dL (ref 8.9–10.3)
Chloride: 119 mmol/L — ABNORMAL HIGH (ref 98–111)
Creatinine, Ser: 0.85 mg/dL (ref 0.61–1.24)
GFR, Estimated: >60 mL/min (ref >60)
Glucose, Bld: 115 mg/dL — ABNORMAL HIGH (ref 70–99)
Potassium: 3.1 mmol/L — ABNORMAL LOW (ref 3.5–5.1)
Sodium: 149 mmol/L — ABNORMAL HIGH (ref 135–145)

## 2022-02-21 LAB — FUNGUS CULTURE RESULT

## 2022-02-21 LAB — FUNGUS CULTURE WITH STAIN

## 2022-02-21 LAB — FUNGAL ORGANISM REFLEX

## 2022-02-21 LAB — PHOSPHORUS: Phosphorus: 3 mg/dL (ref 2.5–4.6)

## 2022-02-21 LAB — MAGNESIUM: Magnesium: 1.7 mg/dL (ref 1.7–2.4)

## 2022-02-21 MED ORDER — MAGNESIUM OXIDE -MG SUPPLEMENT 400 (240 MG) MG PO TABS
400.0000 mg | ORAL_TABLET | Freq: Two times a day (BID) | ORAL | Status: DC
  Administered 2022-02-21 (×2): 400 mg
  Filled 2022-02-21 (×2): qty 1

## 2022-02-21 MED ORDER — POTASSIUM CHLORIDE 10 MEQ/100ML IV SOLN
10.0000 meq | INTRAVENOUS | Status: AC
  Administered 2022-02-21 (×4): 10 meq via INTRAVENOUS
  Filled 2022-02-21 (×4): qty 100

## 2022-02-21 MED ORDER — MAGNESIUM SULFATE 2 GM/50ML IV SOLN
2.0000 g | Freq: Once | INTRAVENOUS | Status: AC
  Administered 2022-02-21: 2 g via INTRAVENOUS
  Filled 2022-02-21: qty 50

## 2022-02-21 MED ORDER — POTASSIUM CHLORIDE 20 MEQ PO PACK
40.0000 meq | PACK | Freq: Every day | ORAL | Status: DC
  Administered 2022-02-21: 40 meq
  Filled 2022-02-21: qty 2

## 2022-02-21 NOTE — Patient Outreach (Signed)
  Care Coordination   Multidisciplinary Case Review Note    02/28/2022 Name: Trevor [name] MRN: [id number] DOB: 1970-11-30  Trevor [name] is a 51 y.o. year old male who sees [name], [place] for primary care.  The multidisciplinary care team will meet 02/23/22 to review patient care needs and barriers.  Template for meeting completed    Goals Addressed               This Visit's Progress     Patient Stated     Improve Nutriton/increase weight [facility]) (pt-stated)        Care Coordination Interventions: Admissionwill updated [facility] liaison and follow patient ambulatory, pending discharge plans Initiated [facility] case discussion template for review on 10/27/20- At risk/presence of malnutrition        SDOH assessments and interventions completed:  No     Care Coordination Interventions Activated:  Yes   Care Coordination Interventions:  Yes, provided   Follow up plan: Follow up call scheduled for 02/28/22    Multidisciplinary Team Attendees:   N/a  Scribe for Multidisciplinary Case Review:  Joelene Millin L. Lavina Hamman, RN, BSN, [place] Coordinator Office number [phone]

## 2022-02-21 NOTE — Patient Outreach (Signed)
  Care Coordination   02/21/2022 Name: Trevor [name] MRN: [id number] DOB: May 30, 1970    Noted patient admission on 02/14/22 at time of scheduled follow up outreach  Plan will updated [facility] liaison and follow patient ambulatory, pending discharge plans Initiated [facility] case discussion template for review on 73/5/43  Joelene Millin L. Lavina Hamman, RN, BSN, [place] Coordinator Office number [phone]

## 2022-02-21 NOTE — Progress Notes (Signed)
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  He also had tingling of his feet, blurry vision and falling few times prior to coming to the hospital with few falls..  Of note, patient was recently hospitalized between 01/21/2022 to 01/25/2022 with progressive weakness fatigue and shortness of breath and was treated with steroids and antibiotics. Also had a bronchoscopy for possible lung abscess on 01/23/2022.   On this admission, patient had mild tachycardia and was hypotensive.  He had mild leukocytosis with WBC at 12.3.  Chemistry showed a potassium low at 3.2 and creatinine at 1.2.   Patient was advised by his oncologist to come to the hospital for these symptoms.  Assessment and Plan:   Principal Problem:   Hypotension Active Problems:   Stage IV B Squamous cell esophageal cancer (HCC)   Cavitary pneumonia/?? Lung Abscess   Hypokalemia   Protein-calorie malnutrition, severe   AKI (acute kidney injury) (HCC)   Metabolic acidosis   Hypoglycemia   Nausea & vomiting   PEG (percutaneous endoscopic gastrostomy) status (HCC)   FTT (failure to thrive) in adult    Nausea & vomiting Improved.  Abnormal LFTs.  Thought to be secondary to esophageal malignancy and failure to thrive.  Continue antiemetics, IV fluids.  Dilated and debris-filled proximal and midesophagus on CT scan.  on regular diet as per patient's request.  Patient has been on tube feedings.  Is advised to back off with regular consistency if persistent symptoms.  Hypokalemia.  Potassium of 3.1 today.  We  will replenish through IV.  Check levels in AM.  Hypophosphatemia, improved after replacement.  Latest phosphorus of 3.0.  Hypomagnesemia , replaced.  Latest magnesium of 1.7.  Hypoglycemia, improved at this time.  Hypotension/orthostatic hypotension   Cortisol at 9.6.  Still orthostatic yesterday.  On  Ringer lactate 125 mill per hour.  We will check orthostatic vitals today.  History comes of fatigue and weakness.  Spoke about the rehabitation and wishes to go home with home health.  On Ringer lactate will continue for 1 more day.  Acute kidney injury.  Received IV fluids.  Improved at this time with creatinine of 0.8.    History of cavitary pneumonia with history of recent lung Abscess Previous hospitalization with questionable cavitary pneumonia/lung abscess and underwent bronchoscopy on 01/23/22.  Currently on 4 weeks of Augmentin starting 01/25/2022.  Continue Augmentin to complete course.  Patient was counseled regarding quitting smoking.  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 Delton Coombes at [facility].  Patient was previously on FOLFOX +Nivolumab -completed 6 cycles on 01/04/2022 but on hold due to continual weight loss.  Continue with PEG tube feeding.  Patient however continues to take solid by mouth as per his preference.  Patient has been seen by palliative care and has poor prognosis.  PEG (percutaneous endoscopic gastrostomy) status (HCC) Declined Osmolite.  Continue Ensure for tube feeding..  Dietitian on board.   Peripheral neuropathy History of alcoholic neuropathy.  On Neurontin at night.  Can titrate as necessary.  Protein-calorie malnutrition, severe less failure to thrive  Present on admission, patient was severely cachectic on presentation..  Chemotherapy on hold due to severe failure to thrive.  Continue tube feeds.  Palliative care has been consulted for goals of care.  Ongoing smoking and alcohol issues.  Found in his room on  02/19/2022.  Patient was counseled about it.        DVT prophylaxis: Place and maintain sequential compression device Start: 02/20/22 0237 Place TED hose Start: 02/16/22 1743 enoxaparin (LOVENOX) injection 30 mg Start: 02/14/22 2100   Code Status:     Code Status: Full Code  Disposition: Home with home health likely on 02/22/2022  Status is: Inpatient  Remains inpatient appropriate because: Multiple symptoms, ambulatory dysfunction, orthostatic hypotension, hypokalemia, IV fluids.   Family Communication:  Spoke with the patient at bedside  Consultants:   palliative care  Procedures:  None  Antimicrobials:  Augmentin until 02-23-22  Anti-infectives (From admission, onward)    Start     Dose/Rate Route Frequency Ordered Stop   02/19/22 2200  amoxicillin-clavulanate (AUGMENTIN) 400-57 MG/5ML suspension 800 mg        800 mg of amoxicillin Per Tube 2 times daily 02/19/22 2109 02/23/22 2159   02/14/22 2330  amoxicillin-clavulanate (AUGMENTIN) 400-57 MG/5ML suspension 800 mg  Status:  Discontinued        800 mg of amoxicillin Per Tube 2 times daily 02/14/22 2058 02/19/22 2109       Subjective: Today, patient was seen and examined at bedside.  Complains of fatigue and weakness.  No nausea vomiting today.  Has had bowel movements x2.  Was severely orthostatic yesterday and was started on fluids.  Objective: Vitals:   02/20/22 0517 02/20/22 0956 02/20/22 2132 02/21/22 0544  BP: 103/64 112/81 122/76 109/81  Pulse: 88 93 (!) 104 (!) 109  Resp: '18  18 18  '$ Temp: 98.4 F (36.9 C) 97.6 F (36.4 C) 98.4 F (36.9 C) 97.9 F (36.6 C)  TempSrc:  Oral  Oral  SpO2: 100% 100% 100% 96%  Weight:   41.8 kg   Height:        Intake/Output Summary (Last 24 hours) at 02/21/2022 0754 Last data filed at 02/21/2022 0200 Gross per 24 hour  Intake 360 ml  Output 0 ml  Net 360 ml    Filed Weights   02/16/22 0505 02/18/22 0600 02/20/22 2132  Weight: 39.8 kg 41.3 kg 41.8 kg     Physical Examination: Body mass index is 15.82 kg/m.   General: Thinly built, not in obvious distress, feels fatigued HENT:   No scleral pallor or icterus noted. Oral mucosa is moist.  Chest:  Clear breath sounds.  Diminished breath sounds bilaterally. No crackles or wheezes.  CVS: S1 &S2 heard. No murmur.  Regular rate and rhythm. Abdomen: Soft, nontender, nondistended.  Bowel sounds are heard.  PEG tube in place. Extremities: No cyanosis, clubbing or edema.  Peripheral pulses are palpable. Psych: Alert, awake and oriented, mildly anxious CNS:  No cranial nerve deficits.  Power equal in all extremities.  Generalized weakness noted. Skin: Warm and dry.  No rashes noted.  Data Reviewed:   CBC: Recent Labs  Lab 02/14/22 1158 02/15/22 0427 02/16/22 1015 02/17/22 0326 02/21/22 0454  WBC 12.3* 10.8* 7.6 7.9 17.8*  NEUTROABS  --   --  5.2 5.4  --   HGB 11.9* 9.2* 8.8* 8.9* 8.7*  HCT 36.6* 27.5* 26.2* 27.1* 26.8*  MCV 78.9* 79.0* 78.7* 78.6* 81.2  PLT 256 219 166 164 160  Basic Metabolic Panel: Recent Labs  Lab 02/15/22 1700 02/16/22 0454 02/17/22 0326 02/18/22 0210 02/19/22 0321 02/20/22 0228 02/21/22 0454  NA 138 138 138 136 142 146* 149*  K 4.6 4.2 3.1* 3.1* 3.9 3.7 3.1*  CL 108 109 108 109 117* 120* 119*  CO2 '22 22 22 22 '$ 19* 20* 21*  GLUCOSE 89 79 72 93 78 95 115*  BUN 29* 26* '17 13 13 13 16  '$ CREATININE 1.05 0.91 0.74 0.77 0.71 0.85 0.85  CALCIUM 9.5 9.3 8.9 8.6* 9.2 9.2 9.1  MG 1.8 1.8 1.8 2.0  --   --  1.7  PHOS  --  1.8* 2.7  --  2.1* 5.1* 3.0     Liver Function Tests: Recent Labs  Lab 02/14/22 1158  AST 27  ALT 11  ALKPHOS 81  BILITOT 0.9  PROT 7.9  ALBUMIN 3.1*      Radiology Studies: No results found.    LOS: 6 days    Flora Lipps, MD [facility] Available via Epic secure chat 7am-7pm After these hours, please refer to coverage provider listed on [email or web address] 02/21/2022, 7:54 AM

## 2022-02-21 NOTE — Progress Notes (Signed)
Physical Therapy Treatment Patient Details Name: Trevor [name] MRN: [id number] DOB: [birth year] Today's Date: 02/21/2022   History of Present Illness 51 y.o. male presents to [facility] hospital on 02/14/2022 with persistent nausea and vomiting. Pt found to be hypotensive on arrival to ED. Pt also with multiple falls recently. PMH includes DM, stg 4 esophageal cancer, HTN.    PT Comments    Pt progressing well towards his physical therapy goals, exhibiting improved activity tolerance and ambulation distance this session. BP stable throughout and pt denying significant dizziness with transitional movements. BP sitting pre mobility: 111/86, post mobility: 113/76 (88). Pt ambulating 150 ft, then additional 200 ft with walker at a min guard assist level. Pt voicing frustration that Rollator will not be covered by insurance per CM. Discussed home set up I.e. having chairs spaced throughout home to sit when needed and activity recommendations/progression. D/c plan remains appropriate.    Recommendations for follow up therapy are one component of a multi-disciplinary discharge planning process, led by the attending physician.  Recommendations may be updated based on patient status, additional functional criteria and insurance authorization.  Follow Up Recommendations  Home health PT     Assistance Recommended at Discharge PRN  Patient can return home with the following A little help with walking and/or transfers;A little help with bathing/dressing/bathroom;Assistance with cooking/housework;Assist for transportation;Help with stairs or ramp for entrance   Equipment Recommendations  Rollator (4 wheels);BSC/3in1    Recommendations for Other Services       Precautions / Restrictions Precautions Precautions: Fall Precaution Comments: history of syncope Restrictions Weight Bearing Restrictions: No     Mobility  Bed Mobility Overal bed mobility: Modified Independent                   Transfers Overall transfer level: Independent Equipment used: None                    Ambulation/Gait Ambulation/Gait assistance: Min guard Gait Distance (Feet): 350 Feet (200", 150") Assistive device: Rollator (4 wheels) Gait Pattern/deviations: Step-through pattern, Decreased stride length Gait velocity: decreased     General Gait Details: Cues for monitoring for activity tolerance   Stairs             Wheelchair Mobility    Modified Rankin (Stroke Patients Only)       Balance     Sitting balance-Leahy Scale: Good     Standing balance support: No upper extremity supported, During functional activity Standing balance-Leahy Scale: Fair                              Cognition Arousal/Alertness: Awake/alert Behavior During Therapy: WFL for tasks assessed/performed Overall Cognitive Status: Within Functional Limits for tasks assessed                                          Exercises      General Comments        Pertinent Vitals/Pain Pain Assessment Pain Assessment: No/denies pain    Home Living                          Prior Function            PT Goals (current goals can now be found in the care plan section) Acute Rehab PT  Goals Patient Stated Goal: to stop falling, improve tolerance for ambulation PT Goal Formulation: With patient Time For Goal Achievement: 03/02/22 Potential to Achieve Goals: Fair Progress towards PT goals: Progressing toward goals    Frequency    Min 3X/week      PT Plan Current plan remains appropriate    Co-evaluation              AM-PAC PT "6 Clicks" Mobility   Outcome Measure  Help needed turning from your back to your side while in a flat bed without using bedrails?: None Help needed moving from lying on your back to sitting on the side of a flat bed without using bedrails?: None Help needed moving to and from a bed to a chair (including a  wheelchair)?: None Help needed standing up from a chair using your arms (e.g., wheelchair or bedside chair)?: None Help needed to walk in hospital room?: A Little Help needed climbing 3-5 steps with a railing? : A Little 6 Click Score: 22    End of Session Equipment Utilized During Treatment: Gait belt Activity Tolerance: Patient tolerated treatment well Patient left: in bed;with call bell/phone within reach Nurse Communication: Mobility status PT Visit Diagnosis: Other abnormalities of gait and mobility (R26.89);Muscle weakness (generalized) (M62.81);History of falling (Z91.81)     Time: 0355-9741 PT Time Calculation (min) (ACUTE ONLY): 19 min  Charges:  $Therapeutic Activity: 8-22 mins                     Trevor [name], PT, DPT Acute Rehabilitation Services Office [phone]    Trevor [name] 02/21/2022, 4:29 PM

## 2022-02-22 DIAGNOSIS — E872 Acidosis, unspecified: Secondary | ICD-10-CM | POA: Diagnosis not present

## 2022-02-22 DIAGNOSIS — Z931 Gastrostomy status: Secondary | ICD-10-CM | POA: Diagnosis not present

## 2022-02-22 DIAGNOSIS — E876 Hypokalemia: Secondary | ICD-10-CM | POA: Diagnosis not present

## 2022-02-22 DIAGNOSIS — C159 Malignant neoplasm of esophagus, unspecified: Secondary | ICD-10-CM | POA: Diagnosis not present

## 2022-02-22 DIAGNOSIS — R627 Adult failure to thrive: Secondary | ICD-10-CM | POA: Diagnosis not present

## 2022-02-22 DIAGNOSIS — R112 Nausea with vomiting, unspecified: Secondary | ICD-10-CM | POA: Diagnosis not present

## 2022-02-22 DIAGNOSIS — E43 Unspecified severe protein-calorie malnutrition: Secondary | ICD-10-CM | POA: Diagnosis not present

## 2022-02-22 DIAGNOSIS — J984 Other disorders of lung: Secondary | ICD-10-CM | POA: Diagnosis not present

## 2022-02-22 DIAGNOSIS — I9589 Other hypotension: Secondary | ICD-10-CM | POA: Diagnosis not present

## 2022-02-22 DIAGNOSIS — E162 Hypoglycemia, unspecified: Secondary | ICD-10-CM | POA: Diagnosis not present

## 2022-02-22 DIAGNOSIS — J189 Pneumonia, unspecified organism: Secondary | ICD-10-CM | POA: Diagnosis not present

## 2022-02-22 DIAGNOSIS — N179 Acute kidney failure, unspecified: Secondary | ICD-10-CM | POA: Diagnosis not present

## 2022-02-22 LAB — BASIC METABOLIC PANEL
Anion gap: 8 (ref 5–15)
BUN: 18 mg/dL (ref 6–20)
CO2: 19 mmol/L — ABNORMAL LOW (ref 22–32)
Calcium: 9.1 mg/dL (ref 8.9–10.3)
Chloride: 118 mmol/L — ABNORMAL HIGH (ref 98–111)
Creatinine, Ser: 0.82 mg/dL (ref 0.61–1.24)
GFR, Estimated: >60 mL/min (ref >60)
Glucose, Bld: 80 mg/dL (ref 70–99)
Potassium: 3.5 mmol/L (ref 3.5–5.1)
Sodium: 145 mmol/L (ref 135–145)

## 2022-02-22 LAB — CBC
HCT: 26.1 % — ABNORMAL LOW (ref 39.0–52.0)
Hemoglobin: 8.5 g/dL — ABNORMAL LOW (ref 13.0–17.0)
MCH: 26.2 pg (ref 26.0–34.0)
MCHC: 32.6 g/dL (ref 30.0–36.0)
MCV: 80.3 fL (ref 80.0–100.0)
Platelets: 166 10*3/uL (ref 150–400)
RBC: 3.25 MIL/uL — ABNORMAL LOW (ref 4.22–5.81)
RDW: 22 % — ABNORMAL HIGH (ref 11.5–15.5)
WBC: 14.4 10*3/uL — ABNORMAL HIGH (ref 4.0–10.5)
nRBC: 0 % (ref 0.0–0.2)

## 2022-02-22 LAB — MAGNESIUM: Magnesium: 2.3 mg/dL (ref 1.7–2.4)

## 2022-02-22 MED ORDER — SACCHAROMYCES BOULARDII 250 MG PO CAPS: 250.0000 mg | ORAL_CAPSULE | Freq: Two times a day (BID) | ORAL | 0 refills | Status: DC

## 2022-02-22 MED ORDER — HEPARIN SOD (PORK) LOCK FLUSH 100 UNIT/ML IV SOLN
500.0000 [IU] | INTRAVENOUS | Status: AC | PRN
  Administered 2022-02-22: 500 [IU]
  Filled 2022-02-22: qty 5

## 2022-02-22 MED ORDER — GABAPENTIN 250 MG/5ML PO SOLN: 300.0000 mg | Freq: Every day | ORAL | 2 refills | Status: AC

## 2022-02-22 NOTE — TOC Progression Note (Signed)
Transition of Care [facility]) - Initial/Assessment Note    Patient Details  Name: Trevor [name] MRN: [id number] Date of Birth: [birth year]  Transition [facility] CM/SW Contact:    Milinda Antis, [place] Phone Number: 02/22/2022, 12:16 PM  Clinical Narrative:                 LCSW read note from palliative that patient was requesting to speak with LCSW in reference to housing and food assistance.  LCSW informed by [facility] that RNCM has already been discussed with patient.  LCSW met with patient at bedside and presented resources for both housing and food insecurity again.    Expected Discharge Plan: [place] Barriers to Discharge: Barriers Resolved   Patient Goals and CMS Choice Patient states their goals for this hospitalization and ongoing recovery are:: To return home CMS Medicare.gov Compare Post Acute Care list provided to:: Patient    Expected Discharge Plan and Services Expected Discharge Plan: [place]   Discharge Planning Services: CM Consult   Living arrangements for the past 2 months: Apartment Expected Discharge Date: 02/22/22                           [facility]: [place] Date [place]: 02/17/22 Time [place]: 45 Representative spoke with at [place]: [place] Arrangements/Services Living arrangements for the past 2 months: [place] with:: Self Patient language and need for interpreter reviewed:: Yes Do you feel safe going back to the place where you live?: Yes      Need for Family Participation in Patient Care: Yes (Comment) Care giver support system in place?: Yes (comment) Current home services: DME, Home PT, Home OT Criminal Activity/Legal Involvement Pertinent to Current Situation/Hospitalization: No - Comment as needed  Activities of Daily Living Home Assistive Devices/Equipment: Environmental consultant (specify type), Cane (specify quad or straight) ADL Screening (condition at time of  admission) Patient's cognitive ability adequate to safely complete daily activities?: Yes Is the patient deaf or have difficulty hearing?: No Does the patient have difficulty seeing, even when wearing glasses/contacts?: No Does the patient have difficulty concentrating, remembering, or making decisions?: No Patient able to express need for assistance with ADLs?: Yes Does the patient have difficulty dressing or bathing?: No Independently performs ADLs?: Yes (appropriate for developmental age) Does the patient have difficulty walking or climbing stairs?: Yes Weakness of Legs: Both Weakness of Arms/Hands: None  Permission Sought/Granted Permission sought to share information with : Case Manager, Family Supports Permission granted to share information with : Yes, Verbal Permission Granted              Emotional Assessment Appearance:: Appears stated age Attitude/Demeanor/Rapport: Engaged Affect (typically observed): Accepting, Appropriate, Hopeful Orientation: : Oriented to Self, Oriented to Place, Oriented to  Time, Oriented to Situation Alcohol / Substance Use: Not Applicable Psych Involvement: No (comment)  Admission diagnosis:  FTT (failure to thrive) in adult [R62.7] Generalized weakness [R53.1] Hypotension [I95.9] Bilious vomiting with nausea [R11.14] Chest pain, unspecified type [R07.9] Patient Active Problem List   Diagnosis Date Noted   FTT (failure to thrive) in adult 02/15/2022   Hypotension 02/14/2022   AKI (acute kidney injury) ([place]) 28/00/3491   Metabolic acidosis 79/15/0569   Hypoglycemia 02/14/2022   Nausea & vomiting 02/14/2022   PEG (percutaneous endoscopic gastrostomy) status ([place]) 02/14/2022   Hyponatremia 01/23/2022   Pulmonary nodules/lesions, multiple    Cavitary  pneumonia/?? 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: [phone]: [phone]    Social Determinants of Health (SDOH) Interventions    Readmission Risk Interventions    01/24/2022    3:13 PM  Readmission Risk Prevention Plan  Transportation Screening Complete  PCP or Specialist Appt within 3-5 Days Complete  HRI or [place]  Social Work Consult for RGentryPlanning/Counseling Complete  Palliative Care Screening Complete  Medication Review (Press photographer Complete

## 2022-02-22 NOTE — Discharge Summary (Signed)
Physician Discharge Summary   Patient: Trevor [name] MRN: [id number] DOB: Oct 14, 1970  Admit date:     02/14/2022  Discharge date: 02/22/22  Discharge Physician: Flora Lipps   PCP: [name], [name]   Recommendations at discharge:   Follow-up with your primary care provider in 1 week.   Check CBC BMP magnesium in the next visit. Follow-up with oncology as scheduled by the oncology clinic.  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 Lung Abscess   Hypokalemia   Protein-calorie malnutrition, severe   AKI (acute kidney injury) (HCC)   Metabolic acidosis   Hypoglycemia   Nausea & vomiting   PEG (percutaneous endoscopic gastrostomy) status (HCC)   FTT (failure to thrive) in adult  Resolved Problems:   * No resolved hospital problems. *  Hospital Course: Patient is a 51 years old male with past medical history of stage IV B squamous cell esophageal cancer with metastasis, PEG tube dependency, microcytic anemia presented to hospital with multiple complaints including nausea and vomiting and abdominal pain around the PEG tube area, cough with greenish productive sputum for 2 weeks and generalized weakness.  He also had tingling of his feet, blurry vision and falling few times prior to coming to the hospital with few falls..  Of note, patient was recently hospitalized between 01/21/2022 to 01/25/2022 with progressive weakness fatigue and shortness of breath and was treated with steroids and antibiotics. Also had a bronchoscopy for possible lung abscess on 01/23/2022.   On this admission, patient had mild tachycardia and was hypotensive.  He had mild leukocytosis with WBC at 12.3.  Chemistry showed a potassium low at 3.2 and creatinine at 1.2.   Patient was advised by his oncologist to come to the hospital for these symptoms.   Following conditions were addressed during hospitalization,    Nausea & vomiting Improved.  Abnormal LFTs.  Thought to be secondary to esophageal malignancy and failure to thrive.   Dilated and debris-filled proximal and midesophagus on CT scan.  on regular diet as per patient's request.  Patient has been on tube feedings.  Is advised to back off with regular consistency if persistent symptoms.  Received IV fluid hydration during hospitalization.   Hypokalemia.  Potassium of 3.5 today.  We will continue potassium supplements on discharge.    Hypophosphatemia, improved after replacement.  Latest phosphorus of 3.0.   Hypomagnesemia , replaced.  Latest magnesium of 2.3   Hypoglycemia, improved at this time.   Hypotension/orthostatic hypotension   Cortisol at 9.6.  Improved after IV fluid hydration.  Improved at this time.  Encourage oral hydration.  Acute kidney injury.  Received IV fluids.  Improved at this time with creatinine of 0.8.    History of cavitary pneumonia with history of recent lung Abscess Previous hospitalization with questionable cavitary pneumonia/lung abscess and underwent bronchoscopy on 01/23/22.  Currently on 4 weeks of Augmentin starting 01/25/2022.  Continue Augmentin to complete course.  Patient was counseled regarding quitting smoking.   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 Delton Coombes at [facility].  Patient was previously on FOLFOX +Nivolumab -completed 6 cycles on 01/04/2022 but on hold due to continual weight loss.  Continue with PEG tube feeding.  Patient however continues to take solid by mouth as per his preference.  Patient has been seen by palliative care and has poor prognosis.  Follow-up with oncology as outpatient.  PEG (percutaneous endoscopic gastrostomy) status ([place])  Continue Ensure for tube feeding..  Dietitian also present during hospitalization.   Peripheral neuropathy History of alcoholic neuropathy.  On Neurontin at night.  We will continue on discharge   Protein-calorie  malnutrition, severe. failure to thrive Present on admission, patient was severely cachectic on presentation..  Chemotherapy on hold due to severe failure to thrive.  Continue tube feeds.  Palliative care has been consulted for goals of care.  Consultants: Palliative care Procedures performed: None Disposition: Home Diet recommendation:  Full liquid diet DISCHARGE MEDICATION: Allergies as of 02/22/2022       Reactions   Pork-derived Products Other (See Comments)   Patient does not eat pork due to his religious belief        Medication List     STOP taking these medications    loperamide 2 MG capsule Commonly known as: IMODIUM       TAKE these medications    albuterol 108 (90 Base) MCG/ACT inhaler Commonly known as: VENTOLIN HFA Inhale 1-2 puffs into the lungs every 6 (six) hours as needed for wheezing or shortness of breath.   allopurinol 300 MG tablet Commonly known as: ZYLOPRIM Take 1 tablet (300 mg total) by mouth daily.   amLODipine 5 MG tablet Commonly known as: NORVASC Take 1 tablet (5 mg total) by mouth daily.   amoxicillin-clavulanate 400-57 MG/5ML suspension Commonly known as: AUGMENTIN Take 10 mLs (800 mg total) by mouth 2 (two) times daily for 28 days. (Medication expires in 10 days. Will need go get 2 refills.)   feeding supplement (OSMOLITE 1.5 CAL) Liqd Place 237 mLs into feeding tube 5 (five) times daily.   fluorouracil CALGB 37106 2,400 mg/m2 in sodium chloride 0.9 % 150 mL Inject 2,400 mg/m2 into the vein over 48 hr. Every 2 weeks   FLUOROURACIL IV Inject into the vein every 14 (fourteen) days.   free water Soln Place 120 mLs into feeding tube 6 (six) times daily.   gabapentin 250 MG/5ML solution Commonly known as: NEURONTIN Place 6 mLs (300 mg total) into feeding tube at bedtime.   ibuprofen 200 MG tablet Commonly known as: ADVIL Take 200 mg by mouth every 6 (six) hours as needed for moderate pain.   Klor-Con M20 20 MEQ  tablet Generic drug: potassium chloride SA TAKE 2 TABLETS BY MOUTH DAILY   LEUCOVORIN CALCIUM IV Inject into the vein every 14 (fourteen) days.   magnesium oxide 400 (240 Mg) MG tablet Commonly known as: MAG-OX Take 1 tablet (400 mg total) by mouth 2 (two) times daily. What changed: when to take this   megestrol 400 MG/10ML suspension Commonly known as: MEGACE Take 10 mLs (400 mg total) by mouth 2 (two) times daily.   OXALIPLATIN IV Inject into the vein every 14 (fourteen) days.   prochlorperazine 10 MG tablet Commonly known as: COMPAZINE Take 1 tablet (10 mg total) by mouth every 6 (six) hours as needed for nausea or vomiting.   saccharomyces boulardii 250 MG capsule Commonly known as: FLORASTOR Take 1 capsule (250 mg total) by mouth 2 (two) times daily.   Vitamin D (Ergocalciferol) 1.25 MG (50000 UNIT) Caps capsule Commonly known as: DRISDOL Take 1 capsule (50,000 Units total) by mouth every Monday.               Durable Medical Equipment  (From admission, onward)           Start     Ordered   02/20/22 1324  For home use only DME 4 wheeled rolling walker with seat  Once       Question:  Patient needs a walker to treat with the following condition  Answer:  Decreased functional mobility and endurance   02/20/22 1323            Follow-up Information     [facility] (Formerly Follow up.   Contact information: [address] [place]  [address] [phone]                Subjective.  Today, patient was inquiring about going home.  Feels okay.  Denies any nausea vomiting fever chills or rigor.  Discharge Exam: Filed Weights   02/16/22 0505 02/18/22 0600 02/20/22 2132  Weight: 39.8 kg 41.3 kg 41.8 kg      02/22/2022    4:59 AM 02/21/2022    4:43 PM 02/21/2022    9:06 AM  Vitals with BMI  Systolic 361 443 154  Diastolic 75 76 77  Pulse 54 58 102    General: Thinly built, not in obvious  distress HENT:   No scleral pallor or icterus noted. Oral mucosa is moist.  Chest:  Clear breath sounds.  Diminished breath sounds bilaterally. No crackles or wheezes.  CVS: S1 &S2 heard. No murmur.  Regular rate and rhythm. Abdomen: Soft, nontender, nondistended.  Bowel sounds are heard.  PEG tube in place. Extremities: No cyanosis, clubbing or edema.  Peripheral pulses are palpable. Psych: Alert, awake and oriented, normal mood CNS:  No cranial nerve deficits.  Power equal in all extremities.   Skin: Warm and dry.  No rashes noted.   Condition at discharge: good  The results of significant diagnostics from this hospitalization (including imaging, microbiology, ancillary and laboratory) are listed below for reference.   Imaging Studies: CT Angio Chest PE W and/or Wo Contrast  Result Date: 02/14/2022 CLINICAL DATA:  Intermittent chest pain x3 days. EXAM: CT ANGIOGRAPHY CHEST WITH CONTRAST TECHNIQUE: Multidetector CT imaging of the chest was performed using the standard protocol during bolus administration of intravenous contrast. Multiplanar CT image reconstructions and MIPs were obtained to evaluate the vascular anatomy. RADIATION DOSE REDUCTION: This exam was performed according to the departmental dose-optimization program which includes automated exposure control, adjustment of the mA and/or kV according to patient size and/or use of iterative reconstruction technique. CONTRAST:  41m OMNIPAQUE IOHEXOL 350 MG/ML SOLN COMPARISON:  January 31, 2022 FINDINGS: Cardiovascular: A left-sided venous Port-A-Cath is noted. There is mild calcification of the aortic arch. The thoracic aorta is otherwise normal in appearance. Satisfactory opacification of the pulmonary arteries to the segmental level. No evidence of pulmonary embolism. Normal heart size with mild coronary artery calcification. No pericardial effusion. Mediastinum/Nodes: No enlarged mediastinal, hilar, or axillary lymph nodes. A dilated and  debris filled proximal and mid esophagus is again seen. The distal esophageal mass noted on the prior exam is poorly visualized on the current study. The thyroid gland and trachea demonstrate no significant findings. Lungs/Pleura: Mild lingular, right upper lobe, right middle lobe and right lower lobe tree-in-bud infiltrates are seen. Mild posterior left lower lobe tree-in-bud infiltrate is also noted. These areas are decreased in severity when compared to the prior study. A 17 mm x 11 mm area of prominent bronchiectasis is noted within the posteromedial aspect of the right lower lobe, along an area of central peribronchovascular consolidation seen on the prior exam (axial CT images 58 through 64, CT series 7). A 6 mm area  of linear scarring is seen within the posterior aspect of the right lower lobe (axial CT image 53, CT series 7). An 11 mm thin walled cavitary lesion is noted within this region on the prior study. There is no evidence of a pleural effusion or pneumothorax. Upper Abdomen: A percutaneous gastrostomy tube is in place. Musculoskeletal: Numerous chronic bilateral rib fractures are again seen. Stable chronic compression fracture deformities are seen within the mid and upper thoracic spine. Review of the MIP images confirms the above findings. IMPRESSION: 1. No evidence of pulmonary embolism. 2. Mild bilateral tree-in-bud infiltrates, decreased in severity when compared to the prior study. 3. Dilated and debris filled proximal and mid esophagus without clear visualization of the distal esophageal mass noted on the prior exam. 4. Numerous chronic bilateral rib fractures and chronic compression fracture deformities within the mid and upper thoracic spine. 5. Percutaneous gastrostomy tube. 6. Aortic atherosclerosis. Aortic Atherosclerosis (ICD10-I70.0). Electronically Signed   By: Virgina Norfolk M.D.   On: 02/14/2022 19:11   DG Chest 2 View  Result Date: 02/14/2022 CLINICAL DATA:  Chest pain EXAM:  CHEST - 2 VIEW COMPARISON:  CT 01/31/2022 FINDINGS: Left chest wall port a catheter is noted with tip in the distal SVC. Heart size appears normal. No pleural effusion or edema. No airspace opacities identified. Unchanged remote healed rib fractures. Remote inferior endplate deformity involving the T7 vertebra appears stable. IMPRESSION: No acute cardiopulmonary abnormalities. Electronically Signed   By: Kerby Moors M.D.   On: 02/14/2022 12:16   CT CHEST ABDOMEN PELVIS W CONTRAST  Result Date: 01/31/2022 CLINICAL DATA:  Esophageal cancer.  Restaging. * Tracking Code: BO * EXAM: CT CHEST, ABDOMEN, AND PELVIS WITH CONTRAST TECHNIQUE: Multidetector CT imaging of the chest, abdomen and pelvis was performed following the standard protocol during bolus administration of intravenous contrast. RADIATION DOSE REDUCTION: This exam was performed according to the departmental dose-optimization program which includes automated exposure control, adjustment of the mA and/or kV according to patient size and/or use of iterative reconstruction technique. CONTRAST:  70m OMNIPAQUE IOHEXOL 300 MG/ML  SOLN COMPARISON:  PET-CT 09/22/2021 an CTA chest 01/21/2022 FINDINGS: CT CHEST FINDINGS Cardiovascular: The heart size is normal. No pericardial effusion. Mild aortic and coronary artery atherosclerotic calcifications. Mediastinum/Nodes: Thyroid gland and trachea appear normal. The proximal and midthoracic esophagus appears dilated and debris-filled. The distal esophageal mass is decreased in size when compared with the examination from 09/08/2021. Currently this measures 1.2 x 1.0 by 5.3 cm, image 98/8. On the previous exam this measured 3.7 x 2.5 by 9.0 cm. No enlarged axillary, supraclavicular, mediastinal, or hilar lymph nodes. Lungs/Pleura: Multifocal areas of ground-glass and tree-in-bud nodularity identified throughout the right upper lobe, right middle lobe and right lower lobe. There also ground-glass and tree-in-bud  nodules identified within the posteroinferior left upper lobe. Areas of cystic bronchiectasis are identified within the right lower lobe. Imaging findings are favored to represent sequelae of chronic aspiration and infection. Previous juxta hilar left upper lobe lung nodule has resolved in the interval. Cavitary nodule within the posteromedial right lower lobe measures 1.1 cm, image 84/3. This is new when compared with baseline PET-CT from 09/22/2021. On the more recent CT angio chest from 01/21/2022 this appeared solid measuring measured 3.0 x 1.3 cm. The central peribronchovascular consolidation within the medial right lower lobe noted on the exam from 01/21/2022 measures 2.4 x 1.7 cm on today's study, image 99/3. On the recent CT of the chest this area measured 2.2 x 3.5 cm.  Musculoskeletal: Multiple remote healed bilateral rib fractures are identified. Unchanged appearance of chronic appearing mild compression deformities within the upper and midthoracic spine, image 94/8. No suspicious bone lesions. CT ABDOMEN PELVIS FINDINGS Hepatobiliary: 4 mm too small to characterize low-density structure within right lobe of liver is stable from 09/08/2021. No suspicious liver lesions identified. Gallbladder appears normal. No bile duct dilatation. Pancreas: Unremarkable. No pancreatic ductal dilatation or surrounding inflammatory changes. Spleen: Normal in size without focal abnormality. Adrenals/Urinary Tract: Normal adrenal glands. Small bilateral kidney cysts are noted. No follow-up imaging recommended. No nephrolithiasis or hydronephrosis. Urinary bladder is unremarkable. Stomach/Bowel: Percutaneous gastrostomy tube is identified within the stomach. No pathologic dilatation of the large or small bowel loops. No signs of bowel wall thickening or inflammation. Vascular/Lymphatic: Mild aortic atherosclerotic calcifications. Upper abdominal vascularity is patent. No signs of abdominopelvic adenopathy. Reproductive:  Prostate is unremarkable. Other: No abdominal wall hernia or abnormality. No abdominopelvic ascites. Musculoskeletal: No aggressive bone lesions identified. There are no CT correlates on today's exam to correspond with the FDG avid intramuscular lesions within the left deltoid muscle and left erector spinae musculature. IMPRESSION: 1. Interval decrease in size of distal esophageal mass compatible with response to therapy. 2. Interval resolution of previous FDG avid upper abdominal nodal metastasis. 3. Previous FDG avid skeletal muscle lesions are not seen on today's exam. 4. Multifocal areas of ground-glass and tree-in-bud nodularity identified throughout the right upper lobe, right middle lobe and right lower lobe. There also ground-glass and tree-in-bud nodules identified within the posteroinferior left upper lobe. Imaging findings are favored to represent sequelae of chronic aspiration and infection. 5. Cavitary nodule within the posteromedial right lower lobe is new when compared with baseline PET-CT from 09/22/2021. On the more recent CT angio chest from 01/21/2022 this appeared solid measuring measured 3.0 x 1.3 cm. This is favored to represent an area of post infectious/inflammatory change. 6. Decrease in size of central perihilar consolidation within the right lower lobe which is favored to represent an area of inflammation or infection. 7. Coronary artery atherosclerotic calcifications. 8.  Aortic Atherosclerosis (ICD10-I70.0). Electronically Signed   By: Kerby Moors M.D.   On: 01/31/2022 16:37    Microbiology: Results for orders placed or performed during the hospital encounter of 01/21/22  MRSA Next Gen by PCR, Nasal     Status: None   Collection Time: 01/21/22 12:32 PM   Specimen: Nasal Mucosa; Nasal Swab  Result Value Ref Range Status   MRSA by PCR Next Gen NOT DETECTED NOT DETECTED Final    Comment: (NOTE) The GeneXpert MRSA Assay (FDA approved for NASAL specimens only), is one component  of a comprehensive MRSA colonization surveillance program. It is not intended to diagnose MRSA infection nor to guide or monitor treatment for MRSA infections. Test performance is not FDA approved in patients less than 9 years old. Performed at [facility], [address], [place], [place] [address]   Culture, blood (Routine X 2) w Reflex to ID Panel     Status: None   Collection Time: 01/21/22  1:17 PM   Specimen: BLOOD  Result Value Ref Range Status   Specimen Description BLOOD  Final   Special Requests BLOOD  Final   Culture   Final    NO GROWTH 5 DAYS Performed at [facility], [address], [place], [place] [address]    Report Status 01/26/2022 FINAL  Final  Culture, blood (Routine X 2) w Reflex to ID Panel     Status: None  Collection Time: 01/21/22  1:17 PM   Specimen: BLOOD LEFT FOREARM  Result Value Ref Range Status   Specimen Description   Final    BLOOD LEFT FOREARM BOTTLES DRAWN AEROBIC AND ANAEROBIC   Special Requests Blood Culture adequate volume  Final   Culture   Final    NO GROWTH 5 DAYS Performed at [facility], [address] [place], [place] [address]    Report Status 01/26/2022 FINAL  Final  SARS Coronavirus 2 by RT PCR (hospital order, performed in [facility] hospital lab) *cepheid single result test* Anterior Nasal Swab     Status: None   Collection Time: 01/22/22  7:08 PM   Specimen: Anterior Nasal Swab  Result Value Ref Range Status   SARS Coronavirus 2 by RT PCR NEGATIVE NEGATIVE Final    Comment: (NOTE) SARS-CoV-2 target nucleic acids are NOT DETECTED.  The SARS-CoV-2 RNA is generally detectable in upper and lower respiratory specimens during the acute phase of infection. The lowest concentration of SARS-CoV-2 viral copies this assay can detect is 250 copies / mL. A negative result does not preclude SARS-CoV-2 infection and should not be used as the sole basis for treatment or other patient management decisions.  A negative result may occur  with improper specimen collection / handling, submission of specimen other than nasopharyngeal swab, presence of viral mutation(s) within the areas targeted by this assay, and inadequate number of viral copies (<250 copies / mL). A negative result must be combined with clinical observations, patient history, and epidemiological information.  Fact Sheet for Patients:   https://www.patel.info/  Fact Sheet for Healthcare Providers: https://hall.com/  This test is not yet approved or  cleared by the [place] FDA and has been authorized for detection and/or diagnosis of SARS-CoV-2 by FDA under an Emergency Use Authorization (EUA).  This EUA will remain in effect (meaning this test can be used) for the duration of the COVID-19 declaration under Section 564(b)(1) of the Act, 21 U.S.C. section 360bbb-3(b)(1), unless the authorization is terminated or revoked sooner.  Performed at [facility], [place] [address] [place], [place] [address]   Culture, fungus without smear     Status: None   Collection Time: 01/23/22  9:51 AM   Specimen: PATH Cytology FNA; Lung  Result Value Ref Range Status   Specimen Description OTHER  Final   Special Requests  RLL NEEDLE ASPIRATION  Final   Culture   Final    NO FUNGUS ISOLATED AFTER 21 DAYS Performed at [facility], [address] [place], [place] [address]    Report Status 02/13/2022 FINAL  Final  Aerobic/Anaerobic Culture w Gram Stain (surgical/deep wound)     Status: None   Collection Time: 01/23/22  9:51 AM   Specimen: PATH Cytology FNA; Lung  Result Value Ref Range Status   Specimen Description OTHER  Final   Special Requests  RLL NEEDLE APSIRATION  Final   Gram Stain   Final    FEW WBC PRESENT,BOTH PMN AND MONONUCLEAR NO ORGANISMS SEEN    Culture   Final    RARE STREPTOCOCCUS CONSTELLATUS Beta hemolytic streptococci are predictably susceptible to penicillin and other beta lactams.  Susceptibility testing not routinely performed. NO ANAEROBES ISOLATED Performed at [facility], [place] [address] [place], [place] [address]    Report Status 01/28/2022 FINAL  Final  Acid Fast Smear (AFB)     Status: None   Collection Time: 01/23/22  9:51 AM   Specimen:  PATH Cytology FNA; Lung  Result Value Ref Range Status   AFB Specimen Processing Concentration  Final   Acid Fast Smear Negative  Final    Comment: (NOTE) Performed At: [facility] [place], [place] [id number] Rush Farmer MD [id number]    Source (AFB) OTHER  Final    Comment: Performed at [facility], [place] [address], [place], [place] [address]  Fungus Culture With Stain     Status: None   Collection Time: 01/23/22 10:02 AM   Specimen: Bronchial Alveolar Lavage; Respiratory  Result Value Ref Range Status   Fungus Stain Final report  Final   Fungus (Mycology) Culture Final report  Final    Comment: (NOTE) Performed At: [facility] [address] [place], [place] [id number] Rush Farmer MD [id number]    Fungal Source BRONCHIAL ALVEOLAR LAVAGE  Final    Comment: Performed at [facility], [place] [address] [place], [place] [address]  Culture, BAL-quantitative w Gram Stain     Status: None   Collection Time: 01/23/22 10:02 AM   Specimen: Bronchial Alveolar Lavage; Respiratory  Result Value Ref Range Status   Specimen Description BRONCHIAL ALVEOLAR LAVAGE  Final   Special Requests NONE  Final   Gram Stain   Final    MODERATE WBC PRESENT, PREDOMINANTLY MONONUCLEAR NO ORGANISMS SEEN    Culture   Final    NO GROWTH 2 DAYS Performed at [facility], [address], [place], [place] [address]    Report Status 01/25/2022 FINAL  Final  Aerobic/Anaerobic Culture w Gram Stain (surgical/deep wound)     Status: None   Collection Time: 01/23/22 10:02 AM   Specimen: Bronchial Alveolar Lavage; Respiratory  Result Value Ref Range Status   Specimen  Description BRONCHIAL ALVEOLAR LAVAGE  Final   Special Requests NONE  Final   Gram Stain   Final    FEW WBC PRESENT,BOTH PMN AND MONONUCLEAR NO ORGANISMS SEEN    Culture   Final    RARE STREPTOCOCCUS CONSTELLATUS Beta hemolytic streptococci are predictably susceptible to penicillin and other beta lactams. Susceptibility testing not routinely performed. NO ANAEROBES ISOLATED Performed at [facility], [place] [address] [place], [place] [address]    Report Status 01/28/2022 FINAL  Final  Acid Fast Smear (AFB)     Status: None   Collection Time: 01/23/22 10:02 AM   Specimen: Bronchial Alveolar Lavage; Respiratory  Result Value Ref Range Status   AFB Specimen Processing Concentration  Final   Acid Fast Smear Negative  Final    Comment: (NOTE) Performed At: [facility] [place], [place] [id number] Rush Farmer MD IR:[id number]    Source (AFB) BRONCHIAL ALVEOLAR LAVAGE  Final    Comment: Performed at [facility], [place] [address], [place], [place] [address]  Fungus Culture Result     Status: None   Collection Time: 01/23/22 10:02 AM  Result Value Ref Range Status   Result 1 Comment  Final    Comment: (NOTE) KOH/Calcofluor preparation:  no fungus observed. Performed At: [facility] [place], [place] [id number] Rush Farmer MD PY:[id number]   Fungal organism reflex     Status: None   Collection Time: 01/23/22 10:02 AM  Result Value Ref Range Status   Fungal result 1 Comment  Final    Comment: (NOTE) No yeast or mold isolated after 4 weeks. Performed At: [facility] [address] [place], [place] [id number] Perlie Gold  Derinda Late MD DJ:[id number]   Fungus Culture With Stain     Status: None   Collection Time: 01/23/22 10:31 AM   Specimen: Bronchial Alveolar Lavage; Respiratory  Result Value Ref Range Status   Fungus Stain Final report  Final   Fungus (Mycology) Culture Final report  Final    Comment:  (NOTE) Performed At: [facility] [address] [place], [place] [id number] Rush Farmer MD OI:[id number]    Fungal Source BRONCHIAL ALVEOLAR LAVAGE  Final    Comment: Performed at [facility], [place] [address] [place], [place] [address]  Culture, BAL-quantitative w Gram Stain     Status: None   Collection Time: 01/23/22 10:31 AM   Specimen: Bronchial Alveolar Lavage; Respiratory  Result Value Ref Range Status   Specimen Description BRONCHIAL ALVEOLAR LAVAGE  Final   Special Requests  RLL  Final   Gram Stain   Final    RARE WBC PRESENT,BOTH PMN AND MONONUCLEAR NO ORGANISMS SEEN    Culture   Final    NO GROWTH 2 DAYS Performed at [facility], [address], [place], [place] [address]    Report Status 01/25/2022 FINAL  Final  Aerobic/Anaerobic Culture w Gram Stain (surgical/deep wound)     Status: None   Collection Time: 01/23/22 10:31 AM   Specimen: Bronchial Alveolar Lavage; Respiratory  Result Value Ref Range Status   Specimen Description BRONCHIAL ALVEOLAR LAVAGE  Final   Special Requests  RLL  Final   Gram Stain   Final    FEW WBC PRESENT, PREDOMINANTLY MONONUCLEAR NO ORGANISMS SEEN    Culture   Final    RARE STREPTOCOCCUS CONSTELLATUS Beta hemolytic streptococci are predictably susceptible to penicillin and other beta lactams. Susceptibility testing not routinely performed. NO ANAEROBES ISOLATED Performed at [facility], [place] [address] [place], [place] [address]    Report Status 01/28/2022 FINAL  Final  Acid Fast Smear (AFB)     Status: None   Collection Time: 01/23/22 10:31 AM   Specimen: Bronchial Alveolar Lavage; Respiratory  Result Value Ref Range Status   AFB Specimen Processing Concentration  Final   Acid Fast Smear Negative  Final    Comment: (NOTE) Performed At: [facility] [place], [place] [id number] Rush Farmer MD [id number]    Source (AFB) BRONCHIAL ALVEOLAR LAVAGE  Final    Comment:  Performed at [facility], [place] [address] [place], [place] [address]  Fungus Culture Result     Status: None   Collection Time: 01/23/22 10:31 AM  Result Value Ref Range Status   Result 1 Comment  Final    Comment: (NOTE) KOH/Calcofluor preparation:  no fungus observed. Performed At: [facility] [place], [place] [id number] Rush Farmer MD YK:[id number]   Fungal organism reflex     Status: None   Collection Time: 01/23/22 10:31 AM  Result Value Ref Range Status   Fungal result 1 Comment  Final    Comment: (NOTE) No yeast or mold isolated after 4 weeks. Performed At: [facility] [place], [place] [id number] Rush Farmer MD ZR:[id number]     Labs: CBC: Recent Labs  Lab 02/16/22 1015 02/17/22 0326 02/21/22 0454 02/22/22 0343  WBC 7.6 7.9 17.8* 14.4*  NEUTROABS 5.2 5.4  --   --   HGB 8.8* 8.9* 8.7* 8.5*  HCT 26.2* 27.1* 26.8* 26.1*  MCV 78.7* 78.6* 81.2 80.3  PLT 166 164 160 166   Basic  Metabolic Panel: Recent Labs  Lab 02/16/22 0454 02/17/22 0326 02/18/22 0210 02/19/22 0321 02/20/22 0228 02/21/22 0454 02/22/22 0343  NA 138 138 136 142 146* 149* 145  K 4.2 3.1* 3.1* 3.9 3.7 3.1* 3.5  CL 109 108 109 117* 120* 119* 118*  CO2 _0 19* 20* 21* 19*  GLUCOSE 79 72 93 78 95 115* 80  BUN 26* _1 CREATININE 0.91 0.74 0.77 0.71 0.85 0.85 0.82  CALCIUM 9.3 8.9 8.6* 9.2 9.2 9.1 9.1  MG 1.8 1.8 2.0  --   --  1.7 2.3  PHOS 1.8* 2.7  --  2.1* 5.1* 3.0  --    Liver Function Tests: No results for input(s): "AST", "ALT", "ALKPHOS", "BILITOT", "PROT", "ALBUMIN" in the last 168 hours. CBG: Recent Labs  Lab 02/15/22 1141 02/15/22 1836 02/17/22 2218  GLUCAP 91 93 85    Discharge time spent: greater than 30 minutes.  Signed: Flora Lipps, MD [facility] 02/22/2022

## 2022-02-22 NOTE — Progress Notes (Signed)
DISCHARGE NOTE HOME [place] to be discharged Home per MD order. Discussed prescriptions and follow up appointments with the patient. Prescriptions given to patient; medication list explained in detail. Patient verbalized understanding.  Skin clean, dry and intact without evidence of skin break down, no evidence of skin tears noted. IV catheter discontinued intact. Site without signs and symptoms of complications. Dressing and pressure applied. Pt denies pain at the site currently. No complaints noted.  Patient free of lines, drains, and wounds.   An After Visit Summary (AVS) was printed and given to the patient. Patient escorted via wheelchair, and discharged home via private auto.  Vira Agar, RN

## 2022-02-22 NOTE — TOC Transition Note (Signed)
Transition of Care [facility]) - CM/SW Discharge Note   Patient Details  Name: Trevor [name] MRN: [id number] Date of Birth: 1970/09/07  Transition of Care [facility]) CM/SW Contact:  Tom-Johnson, Renea Ee, RN Phone Number: 02/22/2022, 10:35 AM   Clinical Narrative:     Patient is scheduled for discharge today. Home health info on AVS. Patient currently active with [employer] for Tube feeding supplies. Family to transport at discharge. No further TOC needs noted.    Final next level of care: [place] Barriers to Discharge: Barriers Resolved   Patient Goals and CMS Choice Patient states their goals for this hospitalization and ongoing recovery are:: To return home CMS Medicare.gov Compare Post Acute Care list provided to:: Patient    Discharge Placement                Patient to be transferred to facility by: Family      Discharge Plan and Services   Discharge Planning Services: CM Consult                        [facility] Agency: [place] [facility] Date [place]: 02/17/22 Time [place]: 2248 Representative spoke with at [place]: [place] ([place]) Interventions     Readmission Risk Interventions    01/24/2022    3:13 PM  Readmission Risk Prevention Plan  Transportation Screening Complete  PCP or Specialist Appt within 3-5 Days Complete  HRI or [place] Complete  Social Work Consult for [place] Planning/Counseling Complete  Palliative Care Screening Complete  Medication Review Press photographer) Complete

## 2022-02-22 NOTE — Plan of Care (Signed)

## 2022-02-28 ENCOUNTER — Ambulatory Visit: Payer: Self-pay | Admitting: *Deleted

## 2022-02-28 ENCOUNTER — Emergency Department (HOSPITAL_COMMUNITY)
Admission: EM | Admit: 2022-02-28 | Discharge: 2022-02-28 | Disposition: A | Discharge status: Home / Self Care | Payer: 59 | Source: Home / Self Care | Attending: Emergency Medicine | Admitting: Emergency Medicine

## 2022-02-28 ENCOUNTER — Ambulatory Visit: Payer: 59 | Admitting: Family Medicine

## 2022-02-28 ENCOUNTER — Other Ambulatory Visit: Payer: Self-pay

## 2022-02-28 ENCOUNTER — Encounter (HOSPITAL_COMMUNITY): Payer: Self-pay

## 2022-02-28 ENCOUNTER — Inpatient Hospital Stay: Payer: 59 | Admitting: Hematology

## 2022-02-28 ENCOUNTER — Emergency Department (HOSPITAL_COMMUNITY): Payer: 59

## 2022-02-28 DIAGNOSIS — Y9301 Activity, walking, marching and hiking: Secondary | ICD-10-CM | POA: Insufficient documentation

## 2022-02-28 DIAGNOSIS — G319 Degenerative disease of nervous system, unspecified: Secondary | ICD-10-CM | POA: Diagnosis not present

## 2022-02-28 DIAGNOSIS — Z79899 Other long term (current) drug therapy: Secondary | ICD-10-CM | POA: Insufficient documentation

## 2022-02-28 DIAGNOSIS — Z8501 Personal history of malignant neoplasm of esophagus: Secondary | ICD-10-CM | POA: Insufficient documentation

## 2022-02-28 DIAGNOSIS — Z743 Need for continuous supervision: Secondary | ICD-10-CM | POA: Diagnosis not present

## 2022-02-28 DIAGNOSIS — I6529 Occlusion and stenosis of unspecified carotid artery: Secondary | ICD-10-CM | POA: Diagnosis not present

## 2022-02-28 DIAGNOSIS — W19XXXA Unspecified fall, initial encounter: Secondary | ICD-10-CM | POA: Diagnosis not present

## 2022-02-28 DIAGNOSIS — I10 Essential (primary) hypertension: Secondary | ICD-10-CM | POA: Insufficient documentation

## 2022-02-28 DIAGNOSIS — S0083XA Contusion of other part of head, initial encounter: Secondary | ICD-10-CM | POA: Diagnosis not present

## 2022-02-28 DIAGNOSIS — W01190A Fall on same level from slipping, tripping and stumbling with subsequent striking against furniture, initial encounter: Secondary | ICD-10-CM | POA: Insufficient documentation

## 2022-02-28 DIAGNOSIS — R531 Weakness: Secondary | ICD-10-CM | POA: Diagnosis not present

## 2022-02-28 DIAGNOSIS — Y92009 Unspecified place in unspecified non-institutional (private) residence as the place of occurrence of the external cause: Secondary | ICD-10-CM | POA: Insufficient documentation

## 2022-02-28 DIAGNOSIS — S0990XA Unspecified injury of head, initial encounter: Secondary | ICD-10-CM | POA: Diagnosis not present

## 2022-02-28 DIAGNOSIS — S01112A Laceration without foreign body of left eyelid and periocular area, initial encounter: Secondary | ICD-10-CM | POA: Insufficient documentation

## 2022-02-28 DIAGNOSIS — M25561 Pain in right knee: Secondary | ICD-10-CM | POA: Diagnosis not present

## 2022-02-28 DIAGNOSIS — E119 Type 2 diabetes mellitus without complications: Secondary | ICD-10-CM | POA: Insufficient documentation

## 2022-02-28 DIAGNOSIS — N179 Acute kidney failure, unspecified: Secondary | ICD-10-CM | POA: Diagnosis not present

## 2022-02-28 NOTE — ED Provider Notes (Signed)
[facility] EMERGENCY DEPARTMENT Provider Note   CSN: [id number] Arrival date & time: 02/28/22  0305     History  Chief Complaint  Patient presents with   Lytle Michaels    Trevor [name] is a 51 y.o. male.  The history is provided by the patient.  Fall He has history of hypertension, diabetes, esophageal cancer and comes in after hitting his head at home with loss of consciousness.  He states that he got up to go to the bathroom and excellently walked into a dresser hitting his head.  He estimates 5-10 minutes of loss of consciousness.  He is complaining of pain in the left frontal area and also in the left side of the abdomen where his feeding tube is.  He is complaining that he wanted the ambulance to take him to [facility] and he wants to go to [facility] because it is a better hospital.   Home Medications Prior to Admission medications   Medication Sig Start Date End Date Taking? Authorizing Provider  albuterol (VENTOLIN HFA) 108 (90 Base) MCG/ACT inhaler Inhale 1-2 puffs into the lungs every 6 (six) hours as needed for wheezing or shortness of breath. 01/04/22   Harriett Rush, PA-C  allopurinol (ZYLOPRIM) 300 MG tablet Take 1 tablet (300 mg total) by mouth daily. 08/04/21   Sanjuana Kava, MD  amLODipine (NORVASC) 5 MG tablet Take 1 tablet (5 mg total) by mouth daily. 11/28/21   [name], [name]  fluorouracil CALGB 40981 2,400 mg/m2 in sodium chloride 0.9 % 150 mL Inject 2,400 mg/m2 into the vein over 48 hr. Every 2 weeks    [provider]  FLUOROURACIL IV Inject into the vein every 14 (fourteen) days.    [provider]  gabapentin (NEURONTIN) 250 MG/5ML solution Place 6 mLs (300 mg total) into feeding tube at bedtime. 02/22/22   Pokhrel, Corrie Mckusick, MD  ibuprofen (ADVIL) 200 MG tablet Take 200 mg by mouth every 6 (six) hours as needed for moderate pain.    [provider]  KLOR-CON M20 20 MEQ tablet TAKE 2 TABLETS BY MOUTH DAILY 12/19/21    Derek Jack, MD  LEUCOVORIN CALCIUM IV Inject into the vein every 14 (fourteen) days.    [provider]  magnesium oxide (MAG-OX) 400 (240 Mg) MG tablet Take 1 tablet (400 mg total) by mouth 2 (two) times daily. Patient taking differently: Take 1 tablet by mouth daily. 01/30/22 04/30/22  [name], [name]  megestrol (MEGACE) 400 MG/10ML suspension Take 10 mLs (400 mg total) by mouth 2 (two) times daily. 12/07/21   Derek Jack, MD  Nutritional Supplements (FEEDING SUPPLEMENT, OSMOLITE 1.5 CAL,) LIQD Place 237 mLs into feeding tube 5 (five) times daily. 01/25/22   Danford, Suann Larry, MD  OXALIPLATIN IV Inject into the vein every 14 (fourteen) days.    [provider]  prochlorperazine (COMPAZINE) 10 MG tablet Take 1 tablet (10 mg total) by mouth every 6 (six) hours as needed for nausea or vomiting. 10/27/21   Derek Jack, MD  saccharomyces boulardii (FLORASTOR) 250 MG capsule Take 1 capsule (250 mg total) by mouth 2 (two) times daily. 02/22/22 05/23/22  Pokhrel, Corrie Mckusick, MD  Vitamin D, Ergocalciferol, (DRISDOL) 1.25 MG (50000 UNIT) CAPS capsule Take 1 capsule (50,000 Units total) by mouth every Monday. 11/28/21   [name], [name]  Water For Irrigation, Sterile (FREE WATER) SOLN Place 120 mLs into feeding tube 6 (six) times daily. Patient not taking: Reported on 02/14/2022 01/25/22  Danford, Suann Larry, MD      Allergies    Pork-derived products    Review of Systems   Review of Systems  All other systems reviewed and are negative.   Physical Exam Updated Vital Signs BP (!) 106/91   Pulse (!) 53   Temp 97.8 F (36.6 C)   Resp 20   Ht '5\' 4"'$  (1.626 m)   Wt 41 kg   SpO2 100%   BMI 15.52 kg/m  Physical Exam Vitals and nursing note reviewed.  Cachectic 51 year old male, resting comfortably and in no acute distress. Vital signs are significant for mildly elevated diastolic blood pressure, mild to moderately slow heart rate. Oxygen saturation  is 100%, which is normal. Head is normocephalic. Superficial laceration present left eyebrow. PERRLA, EOMI. Oropharynx is clear. Neck is nontender and supple without adenopathy or JVD. Back is nontender and there is no CVA tenderness. Lungs are clear without rales, wheezes, or rhonchi. Chest is nontender. Heart has regular rate and rhythm without murmur. Abdomen is soft, flat, nontender. Feeding tube present left upper quadrant. Extremities have no cyanosis or edema, full range of motion is present. Skin is warm and dry without rash. Neurologic: Mental status is normal, cranial nerves are intact, there are no moves all extremities equally.  ED Results / Procedures / Treatments    Radiology CT Head Wo Contrast  Result Date: 02/28/2022 CLINICAL DATA:  Head trauma, moderate to severe. [place] getting out of bed with laceration to left eyebrow. EXAM: CT HEAD WITHOUT CONTRAST TECHNIQUE: Contiguous axial images were obtained from the base of the skull through the vertex without intravenous contrast. RADIATION DOSE REDUCTION: This exam was performed according to the departmental dose-optimization program which includes automated exposure control, adjustment of the mA and/or kV according to patient size and/or use of iterative reconstruction technique. COMPARISON:  None. FINDINGS: Brain: No acute intracranial hemorrhage, midline shift or mass effect. No extra-axial fluid collection. Diffuse atrophy is noted. Periventricular white matter hypodensities are noted bilaterally. No hydrocephalus. Vascular: There is atherosclerotic calcification of the carotid siphons. No hyperdense vessel. Skull: Normal. Negative for fracture or focal lesion. Sinuses/Orbits: No acute finding. Other: None. IMPRESSION: 1. No acute intracranial process. 2. Atrophy with chronic microvascular ischemic changes. Electronically Signed   By: Brett Fairy M.D.   On: 02/28/2022 03:40    Procedures Procedures    Medications Ordered in  ED Medications - No data to display  ED Course/ Medical Decision Making/ A&P                           Medical Decision Making Amount and/or Complexity of Data Reviewed Radiology: ordered.   Fall with reported loss of consciousness but exam showing minimal injury (superficial laceration above the left eye.  I have ordered a CT of the head to rule out intracranial injury.  CT shows no acute intracranial injury.  I have independently viewed the images, and agree with the radiologist's interpretation.  I am discharging him with instructions to follow-up with his healthcare team and continue his cancer treatments.  Final Clinical Impression(s) / ED Diagnoses Final diagnoses:  Contusion of face, initial encounter    Rx / DC Orders ED Discharge Orders     None         Delora Fuel, MD 67/89/38 [phone]

## 2022-02-28 NOTE — Patient Outreach (Signed)
  Care Coordination   coordination  Visit Note   02/28/2022 Name: Trevor [name] MRN: [id number] DOB: 22-Aug-1970  Trevor [name] is a 51 y.o. year old male who sees [name], [place] for primary care. I  was scheduled to outreach to patient today 02/28/22 but noted his ED admission prior to the call attempt  What matters to the patients health and wellness today?  Contusion of face/ED visit today    Goals Addressed   None     SDOH assessments and interventions completed:  No     Care Coordination Interventions Activated:  No  Care Coordination Interventions:  No, not indicated   Follow up plan: Follow up call scheduled for 03/02/22     Encounter Outcome:  Pt. Visit Completed   Jady Braggs L. Lavina Hamman, RN, BSN, [place] Coordinator Office number [phone]

## 2022-02-28 NOTE — Discharge Instructions (Addendum)
Apply ice for 30 minutes at a time, 4 times a day. ° °Take acetaminophen as needed for pain. °

## 2022-02-28 NOTE — ED Triage Notes (Signed)
Pt BIB [facility]. Pt states he fell getting out of bed hitting his face. Small laceration to left eyebrow. Denies taking blood thinners.  A&Ox4

## 2022-03-01 ENCOUNTER — Inpatient Hospital Stay (HOSPITAL_COMMUNITY)
Admission: EM | Admit: 2022-03-01 | Discharge: 2022-03-23 | DRG: 682 | Disposition: A | Discharge status: Skilled Nursing Facility | Payer: 59 | Attending: Family Medicine | Admitting: Family Medicine

## 2022-03-01 ENCOUNTER — Encounter (HOSPITAL_COMMUNITY): Payer: Self-pay | Admitting: Hematology

## 2022-03-01 ENCOUNTER — Encounter: Payer: Self-pay | Admitting: Hematology

## 2022-03-01 ENCOUNTER — Encounter (HOSPITAL_COMMUNITY): Payer: Self-pay

## 2022-03-01 ENCOUNTER — Other Ambulatory Visit: Payer: 59

## 2022-03-01 ENCOUNTER — Other Ambulatory Visit: Payer: Self-pay

## 2022-03-01 ENCOUNTER — Ambulatory Visit: Payer: 59

## 2022-03-01 ENCOUNTER — Ambulatory Visit: Payer: 59 | Admitting: Hematology

## 2022-03-01 ENCOUNTER — Emergency Department (HOSPITAL_COMMUNITY): Payer: 59

## 2022-03-01 DIAGNOSIS — Z743 Need for continuous supervision: Secondary | ICD-10-CM | POA: Diagnosis not present

## 2022-03-01 DIAGNOSIS — R627 Adult failure to thrive: Secondary | ICD-10-CM | POA: Diagnosis present

## 2022-03-01 DIAGNOSIS — M109 Gout, unspecified: Secondary | ICD-10-CM | POA: Diagnosis present

## 2022-03-01 DIAGNOSIS — R2689 Other abnormalities of gait and mobility: Secondary | ICD-10-CM | POA: Diagnosis present

## 2022-03-01 DIAGNOSIS — R5381 Other malaise: Secondary | ICD-10-CM | POA: Diagnosis present

## 2022-03-01 DIAGNOSIS — S01112A Laceration without foreign body of left eyelid and periocular area, initial encounter: Secondary | ICD-10-CM | POA: Diagnosis present

## 2022-03-01 DIAGNOSIS — Z681 Body mass index (BMI) 19 or less, adult: Secondary | ICD-10-CM

## 2022-03-01 DIAGNOSIS — E870 Hyperosmolality and hypernatremia: Secondary | ICD-10-CM | POA: Diagnosis present

## 2022-03-01 DIAGNOSIS — K219 Gastro-esophageal reflux disease without esophagitis: Secondary | ICD-10-CM | POA: Diagnosis present

## 2022-03-01 DIAGNOSIS — R296 Repeated falls: Secondary | ICD-10-CM

## 2022-03-01 DIAGNOSIS — C772 Secondary and unspecified malignant neoplasm of intra-abdominal lymph nodes: Secondary | ICD-10-CM | POA: Diagnosis present

## 2022-03-01 DIAGNOSIS — Z751 Person awaiting admission to adequate facility elsewhere: Secondary | ICD-10-CM

## 2022-03-01 DIAGNOSIS — C159 Malignant neoplasm of esophagus, unspecified: Secondary | ICD-10-CM | POA: Diagnosis present

## 2022-03-01 DIAGNOSIS — F17210 Nicotine dependence, cigarettes, uncomplicated: Secondary | ICD-10-CM | POA: Diagnosis present

## 2022-03-01 DIAGNOSIS — Z79818 Long term (current) use of other agents affecting estrogen receptors and estrogen levels: Secondary | ICD-10-CM

## 2022-03-01 DIAGNOSIS — Z716 Tobacco abuse counseling: Secondary | ICD-10-CM

## 2022-03-01 DIAGNOSIS — Z91199 Patient's noncompliance with other medical treatment and regimen due to unspecified reason: Secondary | ICD-10-CM

## 2022-03-01 DIAGNOSIS — E876 Hypokalemia: Secondary | ICD-10-CM | POA: Diagnosis present

## 2022-03-01 DIAGNOSIS — Z931 Gastrostomy status: Secondary | ICD-10-CM

## 2022-03-01 DIAGNOSIS — R000 Tachycardia, unspecified: Secondary | ICD-10-CM | POA: Diagnosis present

## 2022-03-01 DIAGNOSIS — F1010 Alcohol abuse, uncomplicated: Secondary | ICD-10-CM | POA: Diagnosis present

## 2022-03-01 DIAGNOSIS — I10 Essential (primary) hypertension: Secondary | ICD-10-CM | POA: Diagnosis present

## 2022-03-01 DIAGNOSIS — Z602 Problems related to living alone: Secondary | ICD-10-CM | POA: Diagnosis present

## 2022-03-01 DIAGNOSIS — R5383 Other fatigue: Secondary | ICD-10-CM | POA: Diagnosis not present

## 2022-03-01 DIAGNOSIS — R531 Weakness: Secondary | ICD-10-CM | POA: Diagnosis not present

## 2022-03-01 DIAGNOSIS — Z91014 Allergy to mammalian meats: Secondary | ICD-10-CM

## 2022-03-01 DIAGNOSIS — Z79899 Other long term (current) drug therapy: Secondary | ICD-10-CM

## 2022-03-01 DIAGNOSIS — K9423 Gastrostomy malfunction: Secondary | ICD-10-CM

## 2022-03-01 DIAGNOSIS — W06XXXA Fall from bed, initial encounter: Secondary | ICD-10-CM | POA: Diagnosis present

## 2022-03-01 DIAGNOSIS — R64 Cachexia: Secondary | ICD-10-CM | POA: Diagnosis present

## 2022-03-01 DIAGNOSIS — N179 Acute kidney failure, unspecified: Principal | ICD-10-CM | POA: Diagnosis present

## 2022-03-01 DIAGNOSIS — E43 Unspecified severe protein-calorie malnutrition: Secondary | ICD-10-CM | POA: Diagnosis present

## 2022-03-01 DIAGNOSIS — R110 Nausea: Secondary | ICD-10-CM | POA: Diagnosis not present

## 2022-03-01 DIAGNOSIS — D72829 Elevated white blood cell count, unspecified: Secondary | ICD-10-CM | POA: Diagnosis present

## 2022-03-01 DIAGNOSIS — I959 Hypotension, unspecified: Secondary | ICD-10-CM | POA: Diagnosis present

## 2022-03-01 DIAGNOSIS — M25561 Pain in right knee: Secondary | ICD-10-CM | POA: Diagnosis not present

## 2022-03-01 DIAGNOSIS — Z9221 Personal history of antineoplastic chemotherapy: Secondary | ICD-10-CM

## 2022-03-01 DIAGNOSIS — R911 Solitary pulmonary nodule: Secondary | ICD-10-CM | POA: Diagnosis present

## 2022-03-01 DIAGNOSIS — R1310 Dysphagia, unspecified: Secondary | ICD-10-CM

## 2022-03-01 DIAGNOSIS — C155 Malignant neoplasm of lower third of esophagus: Secondary | ICD-10-CM | POA: Diagnosis present

## 2022-03-01 DIAGNOSIS — R2681 Unsteadiness on feet: Secondary | ICD-10-CM | POA: Diagnosis present

## 2022-03-01 DIAGNOSIS — E860 Dehydration: Secondary | ICD-10-CM | POA: Diagnosis present

## 2022-03-01 DIAGNOSIS — E1142 Type 2 diabetes mellitus with diabetic polyneuropathy: Secondary | ICD-10-CM | POA: Diagnosis present

## 2022-03-01 DIAGNOSIS — E878 Other disorders of electrolyte and fluid balance, not elsewhere classified: Secondary | ICD-10-CM | POA: Diagnosis present

## 2022-03-01 DIAGNOSIS — R112 Nausea with vomiting, unspecified: Secondary | ICD-10-CM | POA: Diagnosis present

## 2022-03-01 DIAGNOSIS — Z720 Tobacco use: Secondary | ICD-10-CM | POA: Diagnosis present

## 2022-03-01 DIAGNOSIS — R1111 Vomiting without nausea: Secondary | ICD-10-CM | POA: Diagnosis not present

## 2022-03-01 DIAGNOSIS — D638 Anemia in other chronic diseases classified elsewhere: Secondary | ICD-10-CM | POA: Diagnosis present

## 2022-03-01 LAB — CBC WITH DIFFERENTIAL/PLATELET
Abs Immature Granulocytes: 0.06 10*3/uL (ref 0.00–0.07)
Basophils Absolute: 0 10*3/uL (ref 0.0–0.1)
Basophils Relative: 0 %
Eosinophils Absolute: 0 10*3/uL (ref 0.0–0.5)
Eosinophils Relative: 0 %
HCT: 35.3 % — ABNORMAL LOW (ref 39.0–52.0)
Hemoglobin: 11.1 g/dL — ABNORMAL LOW (ref 13.0–17.0)
Immature Granulocytes: 1 %
Lymphocytes Relative: 22 %
Lymphs Abs: 2.8 10*3/uL (ref 0.7–4.0)
MCH: 25.6 pg — ABNORMAL LOW (ref 26.0–34.0)
MCHC: 31.4 g/dL (ref 30.0–36.0)
MCV: 81.3 fL (ref 80.0–100.0)
Monocytes Absolute: 0.5 10*3/uL (ref 0.1–1.0)
Monocytes Relative: 4 %
Neutro Abs: 9.3 10*3/uL — ABNORMAL HIGH (ref 1.7–7.7)
Neutrophils Relative %: 73 %
Platelets: 228 10*3/uL (ref 150–400)
RBC: 4.34 MIL/uL (ref 4.22–5.81)
RDW: 22.2 % — ABNORMAL HIGH (ref 11.5–15.5)
WBC: 12.7 10*3/uL — ABNORMAL HIGH (ref 4.0–10.5)
nRBC: 0.4 % — ABNORMAL HIGH (ref 0.0–0.2)

## 2022-03-01 LAB — COMPREHENSIVE METABOLIC PANEL
ALT: 9 U/L (ref 0–44)
AST: 18 U/L (ref 15–41)
Albumin: 2.4 g/dL — ABNORMAL LOW (ref 3.5–5.0)
Alkaline Phosphatase: 62 U/L (ref 38–126)
BUN: 53 mg/dL — ABNORMAL HIGH (ref 6–20)
CO2: 19 mmol/L — ABNORMAL LOW (ref 22–32)
Calcium: 8.7 mg/dL — ABNORMAL LOW (ref 8.9–10.3)
Chloride: >130 mmol/L (ref 98–111)
Creatinine, Ser: 1.32 mg/dL — ABNORMAL HIGH (ref 0.61–1.24)
GFR, Estimated: >60 mL/min (ref >60)
Glucose, Bld: 94 mg/dL (ref 70–99)
Potassium: 3.2 mmol/L — ABNORMAL LOW (ref 3.5–5.1)
Sodium: 159 mmol/L — ABNORMAL HIGH (ref 135–145)
Total Bilirubin: 0.9 mg/dL (ref 0.3–1.2)
Total Protein: 5.7 g/dL — ABNORMAL LOW (ref 6.5–8.1)

## 2022-03-01 LAB — MAGNESIUM: Magnesium: 2 mg/dL (ref 1.7–2.4)

## 2022-03-01 LAB — LIPASE, BLOOD: Lipase: 33 U/L (ref 11–51)

## 2022-03-01 MED ORDER — MECLIZINE HCL 12.5 MG PO TABS
50.0000 mg | ORAL_TABLET | Freq: Once | ORAL | Status: AC
  Administered 2022-03-01: 50 mg via ORAL
  Filled 2022-03-01: qty 4

## 2022-03-01 MED ORDER — OSMOLITE 1.5 CAL PO LIQD
237.0000 mL | Freq: Every day | ORAL | Status: DC
  Administered 2022-03-01: 237 mL
  Filled 2022-03-01 (×9): qty 237

## 2022-03-01 MED ORDER — ALLOPURINOL 300 MG PO TABS
300.0000 mg | ORAL_TABLET | Freq: Every day | ORAL | Status: DC
  Administered 2022-03-01 – 2022-03-03 (×3): 300 mg via ORAL
  Filled 2022-03-01 (×3): qty 1

## 2022-03-01 MED ORDER — ACETAMINOPHEN 650 MG RE SUPP: 650.0000 mg | Freq: Four times a day (QID) | RECTAL | Status: DC | PRN

## 2022-03-01 MED ORDER — OXYCODONE HCL 5 MG PO TABS
5.0000 mg | ORAL_TABLET | Freq: Once | ORAL | Status: AC | PRN
  Administered 2022-03-01: 5 mg via ORAL
  Filled 2022-03-01: qty 1

## 2022-03-01 MED ORDER — SODIUM CHLORIDE 0.9 % IV BOLUS: 1000.0000 mL | Freq: Once | INTRAVENOUS | Status: DC

## 2022-03-01 MED ORDER — RIVAROXABAN 10 MG PO TABS
10.0000 mg | ORAL_TABLET | Freq: Every day | ORAL | Status: DC
  Administered 2022-03-01 – 2022-03-03 (×3): 10 mg via ORAL
  Filled 2022-03-01 (×3): qty 1

## 2022-03-01 MED ORDER — ACETAMINOPHEN 325 MG PO TABS: 650.0000 mg | ORAL_TABLET | Freq: Four times a day (QID) | ORAL | Status: DC | PRN

## 2022-03-01 MED ORDER — SODIUM CHLORIDE 0.9 % IV BOLUS
1000.0000 mL | Freq: Once | INTRAVENOUS | Status: AC
  Administered 2022-03-01: 1000 mL via INTRAVENOUS

## 2022-03-01 MED ORDER — POTASSIUM CHLORIDE 20 MEQ PO PACK: 20.0000 meq | PACK | Freq: Two times a day (BID) | ORAL | Status: DC

## 2022-03-01 MED ORDER — IBUPROFEN 400 MG PO TABS: 600.0000 mg | ORAL_TABLET | Freq: Three times a day (TID) | ORAL | Status: DC | PRN

## 2022-03-01 MED ORDER — DEXTROSE 5 % IV SOLN: INTRAVENOUS | Status: DC

## 2022-03-01 MED ORDER — MEGESTROL ACETATE 400 MG/10ML PO SUSP
400.0000 mg | Freq: Two times a day (BID) | ORAL | Status: DC
  Administered 2022-03-01 – 2022-03-23 (×43): 400 mg
  Filled 2022-03-01 (×43): qty 10

## 2022-03-01 MED ORDER — NICOTINE 14 MG/24HR TD PT24
14.0000 mg | MEDICATED_PATCH | Freq: Every day | TRANSDERMAL | Status: DC | PRN
  Filled 2022-03-01: qty 1

## 2022-03-01 MED ORDER — LACTATED RINGERS IV BOLUS
1000.0000 mL | Freq: Once | INTRAVENOUS | Status: DC
  Administered 2022-03-01: 1000 mL via INTRAVENOUS

## 2022-03-01 MED ORDER — IPRATROPIUM-ALBUTEROL 0.5-2.5 (3) MG/3ML IN SOLN
3.0000 mL | Freq: Four times a day (QID) | RESPIRATORY_TRACT | Status: DC
  Administered 2022-03-01 – 2022-03-02 (×5): 3 mL via RESPIRATORY_TRACT
  Filled 2022-03-01 (×5): qty 3

## 2022-03-01 MED ORDER — ACETAMINOPHEN 325 MG PO TABS: 650.0000 mg | ORAL_TABLET | Freq: Three times a day (TID) | ORAL | Status: DC | PRN

## 2022-03-01 MED ORDER — OXYCODONE HCL 5 MG PO TABS: 5.0000 mg | ORAL_TABLET | Freq: Four times a day (QID) | ORAL | Status: DC | PRN

## 2022-03-01 MED ORDER — ALBUTEROL SULFATE HFA 108 (90 BASE) MCG/ACT IN AERS
2.0000 | INHALATION_SPRAY | Freq: Once | RESPIRATORY_TRACT | Status: AC
  Administered 2022-03-01: 2 via RESPIRATORY_TRACT
  Filled 2022-03-01: qty 6.7

## 2022-03-01 MED ORDER — FENTANYL CITRATE PF 50 MCG/ML IJ SOSY: 12.5000 ug | PREFILLED_SYRINGE | INTRAMUSCULAR | Status: DC | PRN

## 2022-03-01 MED ORDER — SACCHAROMYCES BOULARDII 250 MG PO CAPS
250.0000 mg | ORAL_CAPSULE | Freq: Two times a day (BID) | ORAL | Status: DC
  Administered 2022-03-01 – 2022-03-08 (×15): 250 mg
  Filled 2022-03-01 (×15): qty 1

## 2022-03-01 MED ORDER — GABAPENTIN 250 MG/5ML PO SOLN
300.0000 mg | Freq: Every day | ORAL | Status: DC
  Administered 2022-03-01 – 2022-03-22 (×22): 300 mg
  Filled 2022-03-01 (×25): qty 6

## 2022-03-01 MED ORDER — FREE WATER
100.0000 mL | Freq: Three times a day (TID) | Status: DC
  Administered 2022-03-01 (×2): 100 mL

## 2022-03-01 MED ORDER — POTASSIUM CHLORIDE 20 MEQ PO PACK
40.0000 meq | PACK | Freq: Two times a day (BID) | ORAL | Status: DC
  Administered 2022-03-01 – 2022-03-05 (×10): 40 meq
  Filled 2022-03-01 (×11): qty 2

## 2022-03-01 MED ORDER — ONDANSETRON HCL 4 MG/2ML IJ SOLN: 4.0000 mg | Freq: Four times a day (QID) | INTRAMUSCULAR | Status: DC | PRN

## 2022-03-01 MED ORDER — ONDANSETRON HCL 4 MG PO TABS: 4.0000 mg | ORAL_TABLET | Freq: Four times a day (QID) | ORAL | Status: DC | PRN

## 2022-03-01 NOTE — ED Triage Notes (Signed)
Patient was here last night for fall, N/V states dizzy. Aggressively told this nurse and doctor he wants to be at [facility].

## 2022-03-01 NOTE — ED Notes (Signed)
While assisting pt back to the bathroom pt lost balance and had as assisted descent to the floor. Pt appeared to do it intentionally. Pt did not hit anything and is resting in the bed. Pt has call light and instructed to call if needed anything else, instead of getting up on their own.

## 2022-03-01 NOTE — ED Notes (Signed)
Date and time results received: 03/01/22 1147 (use smartphrase ".now" to insert current time)  Test: chloride Critical Value: over 130  Name of Provider Notified: [place]   Orders Received? Or Actions Taken?: Orders Received - See Orders for details

## 2022-03-01 NOTE — Hospital Course (Addendum)
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 He has frequent falls at home worse in last couple of days. He is dizzy all the time.  He is not doing water flushes in PEG and continues to smoke.  He refuses SNF.  His chemotherapy was placed on hold due to severe weight loss.  He does not accept that he is dying. He wants to remain full code.  He demands to be admitted to [facility], adamantly refusing to stay at [facility].  He was noted to have an AKI with severe hypernatremia consistent with worsening volume and nutritional status.  He was agreeable to IV fluids overnight but wants to be discharged home tomorrow.   He again refuses to be admitted to [facility].   As the hospitalization progress,  he ultimately agreed to stay at [facility] after he came to the realization that he would not receive any different care at [facility].  He was started on hypotonic fluid  and free water flushes with improvement in his hypernatremia.   His hospitalization was prolonged due to G-tube malfunction requiring replacement and then dislodgement of his Gtube.  This required IR to place new G-tube.  Subsequently, delay in insurance authorization resulted in further delay.

## 2022-03-01 NOTE — ED Provider Notes (Signed)
[facility] EMERGENCY DEPARTMENT Provider Note   CSN: [id number] Arrival date & time: 03/01/22  0725     History  Chief Complaint  Patient presents with   Weakness    N/V fell today and yesterday, states wanted to go to [facility] feeding tube and HX esophageal cancer.    Trevor [name] is a 51 y.o. male with a history of stage IV esophageal cancer, feeding tube, severe protein calorie malnutrition, presented to the ED by EMS for concern for dizziness, weakness, fall and knee injury.  The patient was seen yesterday evening earlier this morning in the emergency department after a fall and head injury.  It is CT scan of the head which was unremarkable, and discharged back to his facility.  He says that he has fallen again, that he feels he is having blurred vision, weakness in his arms and legs, and he expresses frustration that he is again brought back to [facility], as he states that "I want to go to [facility] because is a better hospital".  Per review of external records the patient was discharged from the hospital 1 week ago on November 1, after admission for nausea and vomiting, with some abnormal LFTs thought to be secondary to esophageal malignancy and failure to thrive, also noted to have some hypokalemia, hypophosphatemia, hypomagnesemia, and hypoglycemia at the time.  Patient had some hypotension which improved with fluids.  He also had an acute kidney injury which improved to a creatinine of 0.8.  He was noted to have history of cavitary pneumonia and recent lung abscess and completed a near 4-week course of Augmentin for that.  Patient has also been seen by palliative care for his advanced squamous cell esophageal cancer, and has a poor prognosis.  HPI     Home Medications Prior to Admission medications   Medication Sig Start Date End Date Taking? Authorizing Provider  albuterol (VENTOLIN HFA) 108 (90 Base) MCG/ACT inhaler Inhale 1-2 puffs into the lungs every 6  (six) hours as needed for wheezing or shortness of breath. 01/04/22  Yes Pennington, Rebekah M, PA-C  allopurinol (ZYLOPRIM) 300 MG tablet Take 1 tablet (300 mg total) by mouth daily. 08/04/21  Yes Sanjuana Kava, MD  amLODipine (NORVASC) 5 MG tablet Take 1 tablet (5 mg total) by mouth daily. 11/28/21  Yes [name], [name]  fluorouracil CALGB 93810 2,400 mg/m2 in sodium chloride 0.9 % 150 mL Inject 2,400 mg/m2 into the vein over 48 hr. Every 2 weeks   Yes [provider]  FLUOROURACIL IV Inject into the vein every 14 (fourteen) days.   Yes [provider]  gabapentin (NEURONTIN) 250 MG/5ML solution Place 6 mLs (300 mg total) into feeding tube at bedtime. 02/22/22  Yes Pokhrel, Laxman, MD  ibuprofen (ADVIL) 200 MG tablet Take 200 mg by mouth every 6 (six) hours as needed for moderate pain.   Yes [provider]  KLOR-CON M20 20 MEQ tablet TAKE 2 TABLETS BY MOUTH DAILY 12/19/21  Yes Derek Jack, MD  magnesium oxide (MAG-OX) 400 (240 Mg) MG tablet Take 1 tablet (400 mg total) by mouth 2 (two) times daily. Patient taking differently: Take 1 tablet by mouth daily. 01/30/22 04/30/22 Yes [name], [name]  megestrol (MEGACE) 400 MG/10ML suspension Take 10 mLs (400 mg total) by mouth 2 (two) times daily. 12/07/21  Yes Derek Jack, MD  Nutritional Supplements (FEEDING SUPPLEMENT, OSMOLITE 1.5 CAL,) LIQD Place 237 mLs into feeding tube 5 (five) times daily. 01/25/22  Yes Danford, Suann Larry, MD  OXALIPLATIN IV Inject into the vein every 14 (fourteen) days.   Yes [provider]  prochlorperazine (COMPAZINE) 10 MG tablet Take 1 tablet (10 mg total) by mouth every 6 (six) hours as needed for nausea or vomiting. 10/27/21  Yes Derek Jack, MD  saccharomyces boulardii (FLORASTOR) 250 MG capsule Take 1 capsule (250 mg total) by mouth 2 (two) times daily. 02/22/22 05/23/22 Yes Pokhrel, Laxman, MD  Vitamin D, Ergocalciferol, (DRISDOL) 1.25 MG (50000 UNIT) CAPS  capsule Take 1 capsule (50,000 Units total) by mouth every Monday. 11/28/21  Yes [name], [name]  LEUCOVORIN CALCIUM IV Inject into the vein every 14 (fourteen) days.    [provider]  Water For Irrigation, Sterile (FREE WATER) SOLN Place 120 mLs into feeding tube 6 (six) times daily. Patient not taking: Reported on 02/14/2022 01/25/22   Edwin Dada, MD      Allergies    Pork-derived products    Review of Systems   Review of Systems  Physical Exam Updated Vital Signs BP 119/89   Pulse 73   Temp 97.9 F (36.6 C)   Resp 13   Ht '5\' 6"'$  (1.676 m)   Wt 42.6 kg   SpO2 100%   BMI 15.17 kg/m  Physical Exam Constitutional:      General: He is not in acute distress.    Comments: Cachectic appearance  HENT:     Head: Normocephalic and atraumatic.  Eyes:     Conjunctiva/sclera: Conjunctivae normal.     Pupils: Pupils are equal, round, and reactive to light.  Cardiovascular:     Rate and Rhythm: Regular rhythm. Tachycardia present.  Pulmonary:     Effort: Pulmonary effort is normal. No respiratory distress.  Musculoskeletal:     Comments: Tenderness of the bilateral patella, patient refuses to range lower legs.  Skin:    General: Skin is warm and dry.  Neurological:     General: No focal deficit present.     Mental Status: He is alert. Mental status is at baseline.  Psychiatric:        Mood and Affect: Mood normal.        Behavior: Behavior normal.     ED Results / Procedures / Treatments   Labs (all labs ordered are listed, but only abnormal results are displayed) Labs Reviewed  CBC WITH DIFFERENTIAL/PLATELET - Abnormal; Notable for the following components:      Result Value   WBC 12.7 (*)    Hemoglobin 11.1 (*)    HCT 35.3 (*)    MCH 25.6 (*)    RDW 22.2 (*)    nRBC 0.4 (*)    Neutro Abs 9.3 (*)    All other components within normal limits  COMPREHENSIVE METABOLIC PANEL - Abnormal; Notable for the following components:   Sodium 159 (*)     Potassium 3.2 (*)    Chloride >130 (*)    CO2 19 (*)    BUN 53 (*)    Creatinine, Ser 1.32 (*)    Calcium 8.7 (*)    Total Protein 5.7 (*)    Albumin 2.4 (*)    All other components within normal limits  LIPASE, BLOOD  MAGNESIUM  BASIC METABOLIC PANEL  MAGNESIUM  CBC  PHOSPHORUS    EKG EKG Interpretation  Date/Time:  Wednesday March 01 2022 07:58:33 EST Ventricular Rate:  121 PR Interval:  127 QRS Duration: 83 QT Interval:  321 QTC Calculation: 456 R Axis:   -  12 Text Interpretation: Sinus tachycardia Nonspecific T abnormalities, diffuse leads Confirmed by Octaviano Glow [phone]) on 03/01/2022 8:30:36 AM  Radiology DG Knee 2 Views Left  Result Date: 03/01/2022 CLINICAL DATA:  Trauma EXAM: LEFT KNEE - 1-2 VIEW COMPARISON:  None Available. FINDINGS: Trace knee joint effusion. No fracture. No dislocation. Mild enthesopathic changes of the patella. Sclerotic lesion in the fibula has well-circumscribed margins and is favored to be benign in the absence of other known osseous metastases. No discernible soft tissue abnormality. IMPRESSION: Trace knee joint effusion. No fracture or dislocation. Electronically Signed   By: Marin Roberts M.D.   On: 03/01/2022 08:21   DG Knee 2 Views Right  Result Date: 03/01/2022 CLINICAL DATA:  Fall with patellar pain. EXAM: RIGHT KNEE - 1-2 VIEW COMPARISON:  None Available. FINDINGS: No evidence of regional fracture. No weight-bearing compartment degenerative disease. 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 No other finding of note. IMPRESSION: No acute or traumatic finding. 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 Electronically Signed   By: Nelson Chimes M.D.   On: 03/01/2022 08:18   CT Head Wo Contrast  Result Date: 02/28/2022 CLINICAL DATA:  Head trauma, moderate to severe. [place] getting out of bed with laceration to left eyebrow. EXAM: CT HEAD WITHOUT CONTRAST TECHNIQUE: Contiguous axial images were obtained from the base  of the skull through the vertex without intravenous contrast. RADIATION DOSE REDUCTION: This exam was performed according to the departmental dose-optimization program which includes automated exposure control, adjustment of the mA and/or kV according to patient size and/or use of iterative reconstruction technique. COMPARISON:  None. FINDINGS: Brain: No acute intracranial hemorrhage, midline shift or mass effect. No extra-axial fluid collection. Diffuse atrophy is noted. Periventricular white matter hypodensities are noted bilaterally. No hydrocephalus. Vascular: There is atherosclerotic calcification of the carotid siphons. No hyperdense vessel. Skull: Normal. Negative for fracture or focal lesion. Sinuses/Orbits: No acute finding. Other: None. IMPRESSION: 1. No acute intracranial process. 2. Atrophy with chronic microvascular ischemic changes. Electronically Signed   By: Brett Fairy M.D.   On: 02/28/2022 03:40    Procedures Procedures    Medications Ordered in ED Medications  rivaroxaban (XARELTO) tablet 10 mg (10 mg Oral Given 03/01/22 1623)  dextrose 5 % solution ( Intravenous New Bag/Given 03/01/22 1316)  acetaminophen (TYLENOL) tablet 650 mg (has no administration in time range)    Or  acetaminophen (TYLENOL) suppository 650 mg (has no administration in time range)  oxyCODONE (Oxy IR/ROXICODONE) immediate release tablet 5 mg (has no administration in time range)  fentaNYL (SUBLIMAZE) injection 12.5 mcg (has no administration in time range)  ondansetron (ZOFRAN) tablet 4 mg (has no administration in time range)    Or  ondansetron (ZOFRAN) injection 4 mg (has no administration in time range)  nicotine (NICODERM CQ - dosed in mg/24 hours) patch 14 mg (has no administration in time range)  ipratropium-albuterol (DUONEB) 0.5-2.5 (3) MG/3ML nebulizer solution 3 mL (3 mLs Nebulization Given 03/01/22 1552)  allopurinol (ZYLOPRIM) tablet 300 mg (300 mg Oral Given 03/01/22 1447)  gabapentin  (NEURONTIN) 250 MG/5ML solution 300 mg (has no administration in time range)  megestrol (MEGACE) 400 MG/10ML suspension 400 mg (400 mg Per Tube Given 03/01/22 1623)  feeding supplement (OSMOLITE 1.5 CAL) liquid 237 mL (has no administration in time range)  saccharomyces boulardii (FLORASTOR) capsule 250 mg (250 mg Per Tube Given 03/01/22 1450)  free water 100 mL (100 mLs Per Tube Given 03/01/22 1430)  potassium chloride (KLOR-CON) packet 40 mEq (40 mEq  Per Tube Given 03/01/22 1447)  sodium chloride 0.9 % bolus 1,000 mL (0 mLs Intravenous Stopped 03/01/22 1113)  oxyCODONE (Oxy IR/ROXICODONE) immediate release tablet 5 mg (5 mg Oral Given 03/01/22 1106)  sodium chloride 0.9 % bolus 1,000 mL (0 mLs Intravenous Stopped 03/01/22 1008)  meclizine (ANTIVERT) tablet 50 mg (50 mg Oral Given 03/01/22 1106)  albuterol (VENTOLIN HFA) 108 (90 Base) MCG/ACT inhaler 2 puff (2 puffs Inhalation Given 03/01/22 1051)    ED Course/ Medical Decision Making/ A&P Clinical Course as of 03/01/22 1733  Wed Mar 01, 2022  1250 Admitted to dr Wynetta Emery [MT]    Clinical Course User Index [MT] Wyvonnia Dusky, MD                           Medical Decision Making Amount and/or Complexity of Data Reviewed Labs: ordered. Radiology: ordered. ECG/medicine tests: ordered.  Risk Prescription drug management. Decision regarding hospitalization.   This patient presents to the ED with concern for generalized weakness, fall, knee pain. This involves an extensive number of treatment options, and is a complaint that carries with it a high risk of complications and morbidity.  The differential diagnosis includes deconditioning versus electrolyte derangement versus anemia versus other  The patient expresses displeasure about him being brought again to the [facility] emergency department, instead of [facility]. I explained to him that local EMS has their own protocol regarding getting patients to the nearest facility capable of  treatment, which happens to be [facility] in this case. If there is an indication for medical admission and transfer to [facility], we will do so. Otherwise, he will have to sign himself out of the ED here and arrange for his own ride to the [facility] emergency department, if that is his preference.   Co-morbidities that complicate the patient evaluation: Cachexia and extreme protein calorie malnutrition, at high risk for falls, bone injury and fracture.  Additional history obtained from EMS on patient arrival  External records from outside source obtained and reviewed including most recent hospital discharge summary as noted in history above  I ordered and personally interpreted labs.  The pertinent results include:  Na and Cl elevated, likely related to tube feeds, K 3.2, Alb 2.4, BUN 53  I ordered imaging studies including x-ray of the bilateral knees I independently visualized and interpreted imaging which showed no acute fracture I agree with the radiologist interpretation  The patient was maintained on a cardiac monitor.  I personally viewed and interpreted the cardiac monitored which showed an underlying rhythm of: regular HR  Per my interpretation the patient's ECG shows no acute ischemic findings  I ordered medication including fluids and LR for poor hydration; antivert for report of chronic vertigo symptoms  I have reviewed the patients home medicines and have made adjustments as needed  Test Considered: low suspicion for PE, CVA, sepsis at this time   After the interventions noted above, I reevaluated the patient and found that they have: stayed the same  Mental status is good.  His prognosis overall remains poor.  He does not want rehab placement for what I suspect is chronic deconditioning.  But we can treat the metabolic derangement in the hospital.  Dispostion:  After consideration of the diagnostic results and the patients response to treatment, I feel that the patent would  benefit from medical admission.         Final Clinical Impression(s) / ED Diagnoses Final  diagnoses:  Weakness  Hypernatremia  Hyperchloremia    Rx / DC Orders ED Discharge Orders     None         Sakara Lehtinen, Carola Rhine, MD 03/01/22 1733

## 2022-03-01 NOTE — H&P (Addendum)
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 He has frequent falls at home worse in last couple of days. He is dizzy all the time.  He is not doing water flushes in PEG and continues to smoke.  He refuses SNF.  His chemotherapy was placed on hold due to severe weight loss.  He does not accept that he is dying. He wants to remain full code.  He demands to be admitted to [facility], adamantly refusing to stay at [facility].  He was noted to have an AKI with severe hypernatremia consistent with worsening volume and nutritional status.  He was agreeable to IV fluids overnight but wants to be discharged home tomorrow.   He again refuses to be admitted to [facility].     Past Medical History:  Diagnosis Date   Diabetes mellitus without complication ([place])    Esophageal cancer ([place])    Hypertension     Past Surgical History:  Procedure Laterality Date   BIOPSY  08/26/2021   Procedure: BIOPSY;  Surgeon: Eloise Harman, DO;  Location: [facility] ENDO SUITE;  Service: Endoscopy;;   BRONCHIAL BIOPSY  01/23/2022   Procedure: BRONCHIAL BIOPSIES;  Surgeon: Collene Gobble, MD;  Location: [facility] ENDOSCOPY;  Service: Pulmonary;;   BRONCHIAL BRUSHINGS  01/23/2022   Procedure: BRONCHIAL BRUSHINGS;  Surgeon: Collene Gobble, MD;  Location: [facility] ENDOSCOPY;  Service: Pulmonary;;   BRONCHIAL NEEDLE ASPIRATION BIOPSY  01/23/2022   Procedure:  BRONCHIAL NEEDLE ASPIRATION BIOPSIES;  Surgeon: Collene Gobble, MD;  Location: [facility] ENDOSCOPY;  Service: Pulmonary;;   BRONCHIAL WASHINGS  01/23/2022   Procedure: BRONCHIAL WASHINGS;  Surgeon: Collene Gobble, MD;  Location: [facility] ENDOSCOPY;  Service: Pulmonary;;   ESOPHAGOGASTRODUODENOSCOPY (EGD) WITH PROPOFOL N/A 08/26/2021   Procedure: ESOPHAGOGASTRODUODENOSCOPY (EGD) WITH PROPOFOL;  Surgeon: Eloise Harman, DO;  Location: [facility] ENDO SUITE;  Service: Endoscopy;  Laterality: N/A;  2:00pm   GASTROSTOMY N/A 10/12/2021   Procedure: OPEN GASTROSTOMY TUBE;  Surgeon: Aviva Signs, MD;  Location: [facility] ORS;  Service: General;  Laterality: N/A;   PORTACATH PLACEMENT Left 10/12/2021   Procedure: INSERTION PORT-A-CATH;  Surgeon: Aviva Signs, MD;  Location: [facility] ORS;  Service: General;  Laterality: Left;   VIDEO BRONCHOSCOPY WITH RADIAL ENDOBRONCHIAL ULTRASOUND  01/23/2022   Procedure: VIDEO BRONCHOSCOPY WITH RADIAL ENDOBRONCHIAL ULTRASOUND;  Surgeon: Collene Gobble, MD;  Location: [facility] ENDOSCOPY;  Service: Pulmonary;;     reports that he has been smoking cigarettes. He has been smoking an average of .5 packs per day. He has never used smokeless tobacco. He reports that he does not currently use alcohol after a past usage of about 14.0 standard drinks of alcohol per week. He reports that he does not use drugs.  Allergies  Allergen Reactions   Pork-Derived Products Other (See Comments)    Patient does not eat pork due to his religious belief    Family History  Problem Relation Age of Onset   Cancer Father  prostate   Cancer Sister    Colon cancer Neg Hx    Esophageal cancer Neg Hx     Prior to Admission medications   Medication Sig Start Date End Date Taking? Authorizing Provider  albuterol (VENTOLIN HFA) 108 (90 Base) MCG/ACT inhaler Inhale 1-2 puffs into the lungs every 6 (six) hours as needed for wheezing or shortness of breath. 01/04/22  Yes Pennington, Rebekah M, PA-C  allopurinol (ZYLOPRIM) 300  MG tablet Take 1 tablet (300 mg total) by mouth daily. 08/04/21  Yes Sanjuana Kava, MD  amLODipine (NORVASC) 5 MG tablet Take 1 tablet (5 mg total) by mouth daily. 11/28/21  Yes [name], [name]  fluorouracil CALGB 94854 2,400 mg/m2 in sodium chloride 0.9 % 150 mL Inject 2,400 mg/m2 into the vein over 48 hr. Every 2 weeks   Yes [provider]  FLUOROURACIL IV Inject into the vein every 14 (fourteen) days.   Yes [provider]  gabapentin (NEURONTIN) 250 MG/5ML solution Place 6 mLs (300 mg total) into feeding tube at bedtime. 02/22/22  Yes Pokhrel, Laxman, MD  ibuprofen (ADVIL) 200 MG tablet Take 200 mg by mouth every 6 (six) hours as needed for moderate pain.   Yes [provider]  KLOR-CON M20 20 MEQ tablet TAKE 2 TABLETS BY MOUTH DAILY 12/19/21  Yes Derek Jack, MD  magnesium oxide (MAG-OX) 400 (240 Mg) MG tablet Take 1 tablet (400 mg total) by mouth 2 (two) times daily. Patient taking differently: Take 1 tablet by mouth daily. 01/30/22 04/30/22 Yes [name], [name]  megestrol (MEGACE) 400 MG/10ML suspension Take 10 mLs (400 mg total) by mouth 2 (two) times daily. 12/07/21  Yes Derek Jack, MD  Nutritional Supplements (FEEDING SUPPLEMENT, OSMOLITE 1.5 CAL,) LIQD Place 237 mLs into feeding tube 5 (five) times daily. 01/25/22  Yes Danford, Suann Larry, MD  OXALIPLATIN IV Inject into the vein every 14 (fourteen) days.   Yes [provider]  prochlorperazine (COMPAZINE) 10 MG tablet Take 1 tablet (10 mg total) by mouth every 6 (six) hours as needed for nausea or vomiting. 10/27/21  Yes Derek Jack, MD  saccharomyces boulardii (FLORASTOR) 250 MG capsule Take 1 capsule (250 mg total) by mouth 2 (two) times daily. 02/22/22 05/23/22 Yes Pokhrel, Laxman, MD  Vitamin D, Ergocalciferol, (DRISDOL) 1.25 MG (50000 UNIT) CAPS capsule Take 1 capsule (50,000 Units total) by mouth every Monday. 11/28/21  Yes [name], [name]  LEUCOVORIN CALCIUM IV Inject  into the vein every 14 (fourteen) days.    [provider]  Water For Irrigation, Sterile (FREE WATER) SOLN Place 120 mLs into feeding tube 6 (six) times daily. Patient not taking: Reported on 02/14/2022 01/25/22   Edwin Dada, MD    Physical Exam: Vitals:   03/01/22 1000 03/01/22 1051 03/01/22 1100 03/01/22 1227  BP: (!) 126/93  135/88 (!) 134/96  Pulse: (!) 104   (!) 137  Resp: 15  15 (!) 22  Temp:    97.9 F (36.6 C)  TempSrc:    Oral  SpO2: 98% 100% 100% 100%  Weight:      Height:        Constitutional: emaciated angry male, confrontational, NAD, calm, he appears chronically ill and severely emaciated.  Eyes: PERRL, lids and conjunctivae normal ENMT: Mucous membranes are pale and dry. Posterior pharynx clear of any exudate or lesions. Poor dentition.  Neck: normal, supple, no masses, no thyromegaly Respiratory: clear to auscultation bilaterally, no wheezing, no crackles. Normal respiratory effort. No accessory  muscle use.  Cardiovascular: normal s1, s2 sounds, no murmurs / rubs / gallops. No extremity edema. 2+ pedal pulses. No carotid bruits.  Abdomen: PEG in place, no tenderness, no masses palpated. No hepatosplenomegaly. Bowel sounds positive.  Musculoskeletal: no clubbing / cyanosis. No joint deformity upper and lower extremities. Good ROM, no contractures. Normal muscle tone.  Skin: no rashes, lesions, ulcers. No induration Neurologic: CN 2-12 grossly intact. Sensation intact, DTR normal. Strength 5/5 in all 4.  Psychiatric: Poor judgment and insight. Alert and oriented x 3. Normal mood.   Labs on Admission: I have personally reviewed following labs and imaging studies  CBC: Recent Labs  Lab 03/01/22 0911  WBC 12.7*  NEUTROABS 9.3*  HGB 11.1*  HCT 35.3*  MCV 81.3  PLT 220   Basic Metabolic Panel: Recent Labs  Lab 03/01/22 1118  NA 159*  K 3.2*  CL >130*  CO2 19*  GLUCOSE 94  BUN 53*  CREATININE 1.32*  CALCIUM 8.7*  MG 2.0    GFR: Estimated Creatinine Clearance: 39 mL/min (A) (by C-G formula based on SCr of 1.32 mg/dL (H)). Liver Function Tests: Recent Labs  Lab 03/01/22 1118  AST 18  ALT 9  ALKPHOS 62  BILITOT 0.9  PROT 5.7*  ALBUMIN 2.4*   Recent Labs  Lab 03/01/22 1118  LIPASE 33   No results for input(s): "AMMONIA" in the last 168 hours. Coagulation Profile: No results for input(s): "INR", "PROTIME" in the last 168 hours. Cardiac Enzymes: No results for input(s): "CKTOTAL", "CKMB", "CKMBINDEX", "TROPONINI" in the last 168 hours. BNP (last 3 results) No results for input(s): "PROBNP" in the last 8760 hours. HbA1C: No results for input(s): "HGBA1C" in the last 72 hours. CBG: No results for input(s): "GLUCAP" in the last 168 hours. Lipid Profile: No results for input(s): "CHOL", "HDL", "LDLCALC", "TRIG", "CHOLHDL", "LDLDIRECT" in the last 72 hours. Thyroid Function Tests: No results for input(s): "TSH", "T4TOTAL", "FREET4", "T3FREE", "THYROIDAB" in the last 72 hours. Anemia Panel: No results for input(s): "VITAMINB12", "FOLATE", "FERRITIN", "TIBC", "IRON", "RETICCTPCT" in the last 72 hours. Urine analysis:    Component Value Date/Time   COLORURINE AMBER (A) 02/16/2022 0500   APPEARANCEUR CLOUDY (A) 02/16/2022 0500   LABSPEC >1.046 (H) 02/16/2022 0500   PHURINE 5.0 02/16/2022 0500   GLUCOSEU NEGATIVE 02/16/2022 0500   HGBUR NEGATIVE 02/16/2022 0500   BILIRUBINUR SMALL (A) 02/16/2022 0500   KETONESUR NEGATIVE 02/16/2022 0500   PROTEINUR 30 (A) 02/16/2022 0500   NITRITE NEGATIVE 02/16/2022 0500   LEUKOCYTESUR NEGATIVE 02/16/2022 0500    Radiological Exams on Admission: DG Knee 2 Views Left  Result Date: 03/01/2022 CLINICAL DATA:  Trauma EXAM: LEFT KNEE - 1-2 VIEW COMPARISON:  None Available. FINDINGS: Trace knee joint effusion. No fracture. No dislocation. Mild enthesopathic changes of the patella. Sclerotic lesion in the fibula has well-circumscribed margins and is favored to be  benign in the absence of other known osseous metastases. No discernible soft tissue abnormality. IMPRESSION: Trace knee joint effusion. No fracture or dislocation. Electronically Signed   By: Marin Roberts M.D.   On: 03/01/2022 08:21   DG Knee 2 Views Right  Result Date: 03/01/2022 CLINICAL DATA:  Fall with patellar pain. EXAM: RIGHT KNEE - 1-2 VIEW COMPARISON:  None Available. FINDINGS: No evidence of regional fracture. No weight-bearing compartment degenerative disease. 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 No other finding of note. IMPRESSION: No acute or traumatic finding. 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 Electronically Signed   By: Elta Guadeloupe  Shogry M.D.   On: 03/01/2022 08:18   CT Head Wo Contrast  Result Date: 02/28/2022 CLINICAL DATA:  Head trauma, moderate to severe. [place] getting out of bed with laceration to left eyebrow. EXAM: CT HEAD WITHOUT CONTRAST TECHNIQUE: Contiguous axial images were obtained from the base of the skull through the vertex without intravenous contrast. RADIATION DOSE REDUCTION: This exam was performed according to the departmental dose-optimization program which includes automated exposure control, adjustment of the mA and/or kV according to patient size and/or use of iterative reconstruction technique. COMPARISON:  None. FINDINGS: Brain: No acute intracranial hemorrhage, midline shift or mass effect. No extra-axial fluid collection. Diffuse atrophy is noted. Periventricular white matter hypodensities are noted bilaterally. No hydrocephalus. Vascular: There is atherosclerotic calcification of the carotid siphons. No hyperdense vessel. Skull: Normal. Negative for fracture or focal lesion. Sinuses/Orbits: No acute finding. Other: None. IMPRESSION: 1. No acute intracranial process. 2. Atrophy with chronic microvascular ischemic changes. Electronically Signed   By: Brett Fairy M.D.   On: 02/28/2022 03:40    EKG: Independently  reviewed.  Assessment/Plan Principal Problem:   Hypernatremia Active Problems:   Hypokalemia   HTN (hypertension)   Tachycardia, unspecified   Gout   GERD (gastroesophageal reflux disease)   Alcohol abuse   Tobacco abuse   Dysphagia   Stage IV B Squamous cell esophageal cancer (HCC)   Protein-calorie malnutrition, severe   AKI (acute kidney injury) ([place])   PEG (percutaneous endoscopic gastrostomy) status (HCC)   FTT (failure to thrive) in adult    Hypernatremia / Severe Dehydration  - Pt is not doing the free water flushes as ordered - He has severe free water deficiency - D5W infusion ordered to increase free water  - follow BMP, recheck in AM   Hypokalemia - secondary to poor oral intake - check magnesium - replace potassium regularly (he is supposed to be taking daily liquid potassium) - follow BMP  Stage IV B Metastatic Squamous Cell Cancer - his chemotherapy was placed on temporary hold due to severe weight loss - he is followed by Dr. Delton Coombes - palliative care met with him during last admission and he remained in denial about the severity of illness and wanted to be full code/full scope care  - follow up with Dr. Delton Coombes for ongoing discussion  Frequent Falls - likely exacerbated by severe electrolyte derangement  - fall precautions recommended - pt refuses SNF placement  - I worry that he can no longer care for himself at home  Failure to thrive  - Pt seems unable to care for self at home - consult to social worker   Severe protein calorie malnutrition - resume regular diet plus PEG tube feeds  - check Mg, Phos in AM (follow for refeeding syndrome)  AKI  - prerenal from severe dehydration  - hydrating with IV fluids - hold ibuprofen for now - recheck BMP in AM   Tobacco  - nicotine patch ordered - counseled patient on tobacco cessation   Chronic nausea and vomiting - secondary to esophageal malignancy - treat supportively  History of  cavitary pneumonia - completed a 4 week course of augmentin   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  - gabapentin    Leukocytosis  - WBC trending down from recent hospitalization - pt completed 4 week course of augmentin for cavitary pneumonia treatment    DVT prophylaxis: rivaroxaban (pt refuses injectables)  Code Status: full   Family Communication:   Disposition Plan: TBD  Consults called:   Admission status: OBS   Level of care: Med-Surg Irwin Brakeman MD [facility] How to contact the [facility] Attending or Consulting provider [place] or covering provider during after hours [place], for this patient?  Check the care team in [facility] and look for a) attending/consulting [facility] provider listed and b) the [facility] team listed Log into [email or web address] and use [place]'s universal password to access. If you do not have the password, please contact the hospital operator. Locate the [facility] provider you are looking for under [facility] and page to a number that you can be directly reached. If you still have difficulty reaching the provider, please page the [facility] (Director on Call) for the Hospitalists listed on amion for assistance.   If 7PM-7AM, please contact night-coverage [email or web address] Password [facility]  03/01/2022, 12:57 PM

## 2022-03-02 ENCOUNTER — Other Ambulatory Visit: Payer: Self-pay

## 2022-03-02 ENCOUNTER — Inpatient Hospital Stay (HOSPITAL_COMMUNITY): Payer: 59

## 2022-03-02 ENCOUNTER — Ambulatory Visit: Payer: Self-pay | Admitting: *Deleted

## 2022-03-02 DIAGNOSIS — R1310 Dysphagia, unspecified: Secondary | ICD-10-CM | POA: Diagnosis not present

## 2022-03-02 DIAGNOSIS — Z931 Gastrostomy status: Secondary | ICD-10-CM | POA: Diagnosis not present

## 2022-03-02 DIAGNOSIS — W06XXXA Fall from bed, initial encounter: Secondary | ICD-10-CM | POA: Diagnosis not present

## 2022-03-02 DIAGNOSIS — I959 Hypotension, unspecified: Secondary | ICD-10-CM | POA: Diagnosis present

## 2022-03-02 DIAGNOSIS — S2243XA Multiple fractures of ribs, bilateral, initial encounter for closed fracture: Secondary | ICD-10-CM | POA: Diagnosis not present

## 2022-03-02 DIAGNOSIS — R918 Other nonspecific abnormal finding of lung field: Secondary | ICD-10-CM | POA: Diagnosis not present

## 2022-03-02 DIAGNOSIS — K9423 Gastrostomy malfunction: Secondary | ICD-10-CM | POA: Diagnosis not present

## 2022-03-02 DIAGNOSIS — R296 Repeated falls: Secondary | ICD-10-CM | POA: Diagnosis not present

## 2022-03-02 DIAGNOSIS — N179 Acute kidney failure, unspecified: Principal | ICD-10-CM

## 2022-03-02 DIAGNOSIS — D72829 Elevated white blood cell count, unspecified: Secondary | ICD-10-CM | POA: Diagnosis present

## 2022-03-02 DIAGNOSIS — E876 Hypokalemia: Secondary | ICD-10-CM | POA: Diagnosis not present

## 2022-03-02 DIAGNOSIS — F1010 Alcohol abuse, uncomplicated: Secondary | ICD-10-CM | POA: Diagnosis not present

## 2022-03-02 DIAGNOSIS — I6782 Cerebral ischemia: Secondary | ICD-10-CM | POA: Diagnosis not present

## 2022-03-02 DIAGNOSIS — C772 Secondary and unspecified malignant neoplasm of intra-abdominal lymph nodes: Secondary | ICD-10-CM | POA: Diagnosis not present

## 2022-03-02 DIAGNOSIS — S0990XA Unspecified injury of head, initial encounter: Secondary | ICD-10-CM | POA: Diagnosis not present

## 2022-03-02 DIAGNOSIS — R000 Tachycardia, unspecified: Secondary | ICD-10-CM | POA: Diagnosis present

## 2022-03-02 DIAGNOSIS — Z720 Tobacco use: Secondary | ICD-10-CM | POA: Diagnosis not present

## 2022-03-02 DIAGNOSIS — D638 Anemia in other chronic diseases classified elsewhere: Secondary | ICD-10-CM | POA: Diagnosis not present

## 2022-03-02 DIAGNOSIS — E870 Hyperosmolality and hypernatremia: Secondary | ICD-10-CM | POA: Diagnosis not present

## 2022-03-02 DIAGNOSIS — E878 Other disorders of electrolyte and fluid balance, not elsewhere classified: Secondary | ICD-10-CM | POA: Diagnosis present

## 2022-03-02 DIAGNOSIS — E43 Unspecified severe protein-calorie malnutrition: Secondary | ICD-10-CM | POA: Diagnosis not present

## 2022-03-02 DIAGNOSIS — C155 Malignant neoplasm of lower third of esophagus: Secondary | ICD-10-CM | POA: Diagnosis not present

## 2022-03-02 DIAGNOSIS — I10 Essential (primary) hypertension: Secondary | ICD-10-CM | POA: Diagnosis not present

## 2022-03-02 DIAGNOSIS — E1142 Type 2 diabetes mellitus with diabetic polyneuropathy: Secondary | ICD-10-CM | POA: Diagnosis not present

## 2022-03-02 DIAGNOSIS — Z681 Body mass index (BMI) 19 or less, adult: Secondary | ICD-10-CM | POA: Diagnosis not present

## 2022-03-02 DIAGNOSIS — R112 Nausea with vomiting, unspecified: Secondary | ICD-10-CM | POA: Diagnosis present

## 2022-03-02 DIAGNOSIS — R531 Weakness: Secondary | ICD-10-CM | POA: Diagnosis not present

## 2022-03-02 DIAGNOSIS — R627 Adult failure to thrive: Secondary | ICD-10-CM | POA: Diagnosis not present

## 2022-03-02 DIAGNOSIS — C159 Malignant neoplasm of esophagus, unspecified: Secondary | ICD-10-CM | POA: Diagnosis not present

## 2022-03-02 DIAGNOSIS — E860 Dehydration: Secondary | ICD-10-CM | POA: Diagnosis present

## 2022-03-02 DIAGNOSIS — S01112A Laceration without foreign body of left eyelid and periocular area, initial encounter: Secondary | ICD-10-CM | POA: Diagnosis present

## 2022-03-02 DIAGNOSIS — R5381 Other malaise: Secondary | ICD-10-CM | POA: Diagnosis present

## 2022-03-02 DIAGNOSIS — R64 Cachexia: Secondary | ICD-10-CM | POA: Diagnosis not present

## 2022-03-02 LAB — BASIC METABOLIC PANEL
Anion gap: 4 — ABNORMAL LOW (ref 5–15)
BUN: 47 mg/dL — ABNORMAL HIGH (ref 6–20)
CO2: 21 mmol/L — ABNORMAL LOW (ref 22–32)
Calcium: 9.1 mg/dL (ref 8.9–10.3)
Chloride: >130 mmol/L (ref 98–111)
Creatinine, Ser: 1.22 mg/dL (ref 0.61–1.24)
GFR, Estimated: >60 mL/min (ref >60)
Glucose, Bld: 125 mg/dL — ABNORMAL HIGH (ref 70–99)
Potassium: 3.4 mmol/L — ABNORMAL LOW (ref 3.5–5.1)
Sodium: 156 mmol/L — ABNORMAL HIGH (ref 135–145)

## 2022-03-02 LAB — PHOSPHORUS: Phosphorus: 1.9 mg/dL — ABNORMAL LOW (ref 2.5–4.6)

## 2022-03-02 LAB — CBC
HCT: 28.9 % — ABNORMAL LOW (ref 39.0–52.0)
Hemoglobin: 9 g/dL — ABNORMAL LOW (ref 13.0–17.0)
MCH: 26 pg (ref 26.0–34.0)
MCHC: 31.1 g/dL (ref 30.0–36.0)
MCV: 83.5 fL (ref 80.0–100.0)
Platelets: 153 10*3/uL (ref 150–400)
RBC: 3.46 MIL/uL — ABNORMAL LOW (ref 4.22–5.81)
RDW: 22.2 % — ABNORMAL HIGH (ref 11.5–15.5)
WBC: 12.6 10*3/uL — ABNORMAL HIGH (ref 4.0–10.5)
nRBC: 0.4 % — ABNORMAL HIGH (ref 0.0–0.2)

## 2022-03-02 LAB — MAGNESIUM: Magnesium: 2 mg/dL (ref 1.7–2.4)

## 2022-03-02 MED ORDER — IPRATROPIUM-ALBUTEROL 0.5-2.5 (3) MG/3ML IN SOLN
3.0000 mL | Freq: Two times a day (BID) | RESPIRATORY_TRACT | Status: DC
  Administered 2022-03-03 – 2022-03-09 (×14): 3 mL via RESPIRATORY_TRACT
  Filled 2022-03-02 (×13): qty 3

## 2022-03-02 MED ORDER — POTASSIUM PHOSPHATES 15 MMOLE/5ML IV SOLN
15.0000 mmol | Freq: Once | INTRAVENOUS | Status: AC
  Administered 2022-03-02: 15 mmol via INTRAVENOUS
  Filled 2022-03-02: qty 5

## 2022-03-02 MED ORDER — FREE WATER
125.0000 mL | Status: DC
  Administered 2022-03-02 – 2022-03-03 (×4): 125 mL

## 2022-03-02 NOTE — Patient Outreach (Signed)
  Care Coordination   Multidisciplinary Case Review Note    03/02/2022 Name: Trevor [name] MRN: [id number] DOB: Sep 27, 1970  Trevor [name] is a 51 y.o. year old male who sees [name], [place] for primary care.  The multidisciplinary care team met today to review patient care needs and barriers.   Trevor [name] has been hospitalized for the last 4 scheduled outreach attempts.    Goals Addressed               This Visit's Progress     Patient Stated     Improve Nutriton/increase weight ([facility]) (pt-stated)   Not on track     Care Coordination Interventions: Admissions for falls, nausea & vomiting, poor fluid PEG intake and electrolyte imbalances noted and will follow patient ambulatory, pending discharge plans [facility] case discussion template updated Patient has been hospitalized for the last 4 scheduled [facility] RN CM outreach. Will continue to successfully assist patient when in ambulatory setting Next outreach attempt for 03/09/22      Manage the worsening symptoms of Esophageal cancer ([facility]) (pt-stated)   Not on track     Care Coordination Interventions: Patient has been hospitalized for the last 4 scheduled [facility] RN CM outreach. Will continue to successfully assist patient when in ambulatory setting Next outreach attempt for 03/09/22        SDOH assessments and interventions completed:  No     Care Coordination Interventions Activated:  Yes   Care Coordination Interventions:  Yes, provided   Follow up plan: Follow up call scheduled for 03/09/22    Multidisciplinary Team Attendees:   Barbaraann Faster  Scribe for Multidisciplinary Case Review:  [place] Lavina Hamman, RN, BSN, [place] Coordinator Office number [phone]

## 2022-03-02 NOTE — Progress Notes (Signed)
PROGRESS NOTE   Trevor [name]  BPZ:[id number] DOB: [birth year] DOA: 03/01/2022 PCP: [name], [name]   Chief Complaint  Patient presents with   Weakness    N/V fell today and yesterday, states wanted to go to [facility] feeding tube and HX esophageal cancer.   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 He has frequent falls at home worse in last couple of days. He is dizzy all the time.  He is not doing water flushes in PEG and continues to smoke.  He refuses SNF.  His chemotherapy was placed on hold due to severe weight loss.  He does not accept that he is dying. He wants to remain full code.  He demands to be admitted to [facility], adamantly refusing to stay at [facility].  He was noted to have an AKI with severe hypernatremia consistent with worsening volume and nutritional status.  He was agreeable to IV fluids overnight but wants to be discharged home tomorrow.   He again refuses to be admitted to [facility].     Assessment and Plan:  Hypernatremia / Severe Dehydration  - Pt is not doing the free water flushes as ordered at home  - He has severe free water deficiency, increased amount and frequency of flushes on 11/9 - D5W infusion ordered to increase free water  - follow BMP, recheck in AM    Hypokalemia - being repleted thru tube - secondary to poor oral intake - magnesium is 2.0 - replace potassium regularly (he is supposed to be taking daily liquid potassium) - follow BMP   Stage IV B Metastatic Squamous Cell Cancer - his chemotherapy was placed on temporary hold due to severe weight loss - he is followed by Dr. Delton Coombes - palliative care met with him during last admission and  he remained in denial about the severity of illness and wanted to be full code/full scope care  - follow up with Dr. Delton Coombes for ongoing discussion   Frequent Falls - likely exacerbated by severe electrolyte derangement  - fall precautions recommended - pt refuses SNF placement  - I worry that he can no longer care for himself at home - PT/OT eval requested    Failure to thrive  - Pt seems unable to care for self at home - consult to social worker and PT    Severe protein calorie malnutrition - resume regular diet plus PEG tube feeds  - check Mg, Phos in AM (follow for refeeding syndrome)  Hypophosphatemia - IV replacement ordered 11/9   AKI - treated and resolved  - prerenal from severe dehydration  - hydrating with IV fluids - hold ibuprofen for now - recheck BMP in AM    Tobacco  - nicotine patch ordered - counseled patient on tobacco cessation    Chronic nausea and vomiting - secondary to esophageal malignancy - treat supportively   History of cavitary pneumonia - completed a 4 week course of augmentin    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  - gabapentin     Leukocytosis  - WBC trending down from recent hospitalization - pt completed 4 week course of augmentin for cavitary pneumonia treatment      DVT prophylaxis: rivaroxaban (pt refuses injectables)  Code Status: full  Family Communication:   Disposition Plan: TBD   Consultants:   Procedures:   Antimicrobials:    Subjective: Pt still insists on going to [facility] or [facility], refusing to stay at [facility].  He had a bit of a soft fall when going to bathroom, no injuries, Aide was able to cushion his fall.  He had refused some water boluses yesterday and he was counseled on his morning labs.    Objective: Vitals:   03/02/22 0600 03/02/22 0630 03/02/22 0700 03/02/22 0830  BP: 122/83 126/82 104/61 (!) 120/91  Pulse:    86  Resp: _0 Temp:      TempSrc:      SpO2: 98% 98% 98% 100%   Weight:      Height:        Intake/Output Summary (Last 24 hours) at 03/02/2022 1032 Last data filed at 03/02/2022 0326 Gross per 24 hour  Intake 1331.21 ml  Output --  Net 1331.21 ml   Filed Weights   03/01/22 0746  Weight: 42.6 kg   Examination:  General exam: severely emaciated male, awake, alert, Uncooperative at times, Appears calm and comfortable  Respiratory system: Clear to auscultation. Respiratory effort normal. Cardiovascular system: normal S1 & S2 heard. No JVD, murmurs, rubs, gallops or clicks. No pedal edema. Gastrointestinal system: Abdomen is nondistended, soft and nontender. No organomegaly or masses felt. Normal bowel sounds heard. Central nervous system: Alert and oriented. No focal neurological deficits. Extremities: Symmetric 5 x 5 power. Skin: No rashes, lesions or ulcers. Psychiatry: Judgement and insight appear poor. Mood & affect angry at times.    Data Reviewed: I have personally reviewed following labs and imaging studies  CBC: Recent Labs  Lab 03/01/22 0911 03/02/22 0326  WBC 12.7* 12.6*  NEUTROABS 9.3*  --   HGB 11.1* 9.0*  HCT 35.3* 28.9*  MCV 81.3 83.5  PLT 228 628    Basic Metabolic Panel: Recent Labs  Lab 03/01/22 1118 03/02/22 0326  NA 159* 156*  K 3.2* 3.4*  CL >130* >130*  CO2 19* 21*  GLUCOSE 94 125*  BUN 53* 47*  CREATININE 1.32* 1.22  CALCIUM 8.7* 9.1  MG 2.0 2.0  PHOS  --  1.9*    CBG: No results for input(s): "GLUCAP" in the last 168 hours.  No results found for this or any previous visit (from the past 240 hour(s)).   Radiology Studies: DG Knee 2 Views Left  Result Date: 03/01/2022 CLINICAL DATA:  Trauma EXAM: LEFT KNEE - 1-2 VIEW COMPARISON:  None Available. FINDINGS: Trace knee joint effusion. No fracture. No dislocation. Mild enthesopathic changes of the patella. Sclerotic lesion in the fibula has well-circumscribed margins and is favored to be benign in the absence of other known osseous metastases. No  discernible soft tissue abnormality. IMPRESSION: Trace knee joint effusion. No fracture or dislocation. Electronically Signed   By: Marin Roberts M.D.   On: 03/01/2022 08:21   DG Knee 2 Views Right  Result Date: 03/01/2022 CLINICAL DATA:  Fall with patellar pain. EXAM: RIGHT KNEE - 1-2 VIEW COMPARISON:  None Available. FINDINGS: No evidence of regional fracture. No weight-bearing compartment degenerative disease. 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 No other finding of note. IMPRESSION: No acute or traumatic finding. 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 Electronically Signed   By: Nelson Chimes M.D.   On: 03/01/2022 08:18    Scheduled Meds:  allopurinol  300 mg Oral Daily   feeding supplement (OSMOLITE 1.5 CAL)  237 mL Per Tube 5 X Daily   free water  125 mL Per Tube Q4H   gabapentin  300 mg Per Tube QHS   ipratropium-albuterol  3 mL Nebulization QID   megestrol  400 mg Per Tube BID   potassium chloride  40 mEq Per Tube BID   rivaroxaban  10 mg Oral Q supper   saccharomyces boulardii  250 mg Per Tube BID   Continuous Infusions:  dextrose 125 mL/hr at 03/02/22 0755     LOS: 0 days   Time spent: 35 mins  Ajahni Nay Wynetta Emery, MD How to contact the [facility] Attending or Consulting provider [place] or covering provider during after hours [place], for this patient?  Check the care team in [facility] and look for a) attending/consulting TRH provider listed and b) the [facility] team listed Log into [email or web address] and use [place]'s universal password to access. If you do not have the password, please contact the hospital operator. Locate the [facility] provider you are looking for under [facility] and page to a number that you can be directly reached. If you still have difficulty reaching the provider, please page the [facility] (Director on Call) for the Hospitalists listed on amion for assistance.  03/02/2022, 10:32 AM

## 2022-03-02 NOTE — Patient Instructions (Addendum)
Visit Information  Thank you for taking time to visit with me today. Please don't hesitate to contact me if I can be of assistance to you.   Following are the goals we discussed today:   Goals Addressed               This Visit's Progress     Patient Stated     Improve Nutriton/increase weight (THN) (pt-stated)   Not on track     Care Coordination Interventions: Admissions for falls, nausea & vomiting, poor fluid PEG intake and electrolyte imbalances noted and will follow patient ambulatory, pending discharge plans [facility] case discussion template updated Patient has been hospitalized for the last 4 scheduled [facility] RN CM outreach. Will continue to successfully assist patient when in ambulatory setting Next outreach attempt for 03/09/22      Manage the worsening symptoms of Esophageal cancer (THN) (pt-stated)   Not on track     Care Coordination Interventions: Patient has been hospitalized for the last 4 scheduled [facility] RN CM outreach. Will continue to successfully assist patient when in ambulatory setting Next outreach attempt for 03/09/22        Our next appointment is by telephone on 03/09/22 at 1130  Please call the care guide team at [phone] if you need to cancel or reschedule your appointment.   If you are experiencing a Mental Health or [place] or need someone to talk to, please call the Suicide and Crisis Lifeline: 988 call the [place] National Suicide Prevention Lifeline: 8043874505 or TTY: 367-408-9669 TTY 8144017294) to talk to a trained counselor call 1-800-273-TALK (toll free, 24 hour hotline) call the [facility]: 216-774-5213 call 911   The patient verbalized understanding of instructions, educational materials, and care plan provided today and DECLINED offer to receive copy of patient instructions, educational materials, and care plan.   The patient has been provided with contact information for the care management team and  has been advised to call with any health related questions or concerns.   Enrica Corliss L. Lavina Hamman, RN, BSN, [place] Coordinator Office number [phone]

## 2022-03-02 NOTE — ED Notes (Signed)
Pt given socks

## 2022-03-02 NOTE — ED Notes (Signed)
Pt was given breakfast but stated that he was to tired to eat.

## 2022-03-02 NOTE — ED Notes (Signed)
In to give pt his 0600 tube feed. Pt stated he "did not want it" at this time.

## 2022-03-02 NOTE — Progress Notes (Signed)
  Transition of Care [facility]) Screening Note   Patient Details  Name: Trevor [name] Date of Birth: 10/17/1970   Transition of Care [facility]) CM/SW Contact:    Iona Beard, [place] Phone Number: 03/02/2022, 10:15 AM    Transition of Care Department [facility]) has reviewed patient and no TOC needs have been identified at this time. We will continue to monitor patient advancement through interdisciplinary progression rounds. If new patient transition needs arise, please place a TOC consult.

## 2022-03-02 NOTE — ED Notes (Signed)
Patient given socks and per RN and by patients request knees wrapped. RN aware.

## 2022-03-02 NOTE — ED Notes (Signed)
Attempt to access Port x 2 was made - would not pull back blood nor flush.  Pt accidentally pulled out needle prior to shift change according to previous nurse.  CXR was ordered per DR Christy Gentles.

## 2022-03-02 NOTE — ED Notes (Addendum)
Pt had a fall while getting up from the bedside toilet, pt was found on his side; However, pt stated he fell back, However, pt was found on his side. pt ripped his port out as well. No injuries noted, MD made aware, states an order for CT of the head

## 2022-03-02 NOTE — ED Notes (Signed)
Patient transported to CT 

## 2022-03-02 NOTE — ED Notes (Signed)
Pt transferred independently from bed to toilet for bowel movement.

## 2022-03-03 ENCOUNTER — Other Ambulatory Visit: Payer: Self-pay

## 2022-03-03 DIAGNOSIS — N179 Acute kidney failure, unspecified: Secondary | ICD-10-CM | POA: Diagnosis not present

## 2022-03-03 DIAGNOSIS — E870 Hyperosmolality and hypernatremia: Secondary | ICD-10-CM | POA: Diagnosis not present

## 2022-03-03 DIAGNOSIS — R1310 Dysphagia, unspecified: Secondary | ICD-10-CM | POA: Diagnosis not present

## 2022-03-03 DIAGNOSIS — R296 Repeated falls: Secondary | ICD-10-CM | POA: Diagnosis not present

## 2022-03-03 LAB — BASIC METABOLIC PANEL
Anion gap: 2 — ABNORMAL LOW (ref 5–15)
BUN: 30 mg/dL — ABNORMAL HIGH (ref 6–20)
CO2: 19 mmol/L — ABNORMAL LOW (ref 22–32)
Calcium: 8.7 mg/dL — ABNORMAL LOW (ref 8.9–10.3)
Chloride: 123 mmol/L — ABNORMAL HIGH (ref 98–111)
Creatinine, Ser: 0.8 mg/dL (ref 0.61–1.24)
GFR, Estimated: >60 mL/min (ref >60)
Glucose, Bld: 102 mg/dL — ABNORMAL HIGH (ref 70–99)
Potassium: 4.1 mmol/L (ref 3.5–5.1)
Sodium: 144 mmol/L (ref 135–145)

## 2022-03-03 LAB — CBC
HCT: 26.9 % — ABNORMAL LOW (ref 39.0–52.0)
Hemoglobin: 8.6 g/dL — ABNORMAL LOW (ref 13.0–17.0)
MCH: 25.9 pg — ABNORMAL LOW (ref 26.0–34.0)
MCHC: 32 g/dL (ref 30.0–36.0)
MCV: 81 fL (ref 80.0–100.0)
Platelets: 131 10*3/uL — ABNORMAL LOW (ref 150–400)
RBC: 3.32 MIL/uL — ABNORMAL LOW (ref 4.22–5.81)
RDW: 22 % — ABNORMAL HIGH (ref 11.5–15.5)
WBC: 13 10*3/uL — ABNORMAL HIGH (ref 4.0–10.5)

## 2022-03-03 LAB — PHOSPHORUS: Phosphorus: 2 mg/dL — ABNORMAL LOW (ref 2.5–4.6)

## 2022-03-03 MED ORDER — ONDANSETRON HCL 4 MG PO TABS
4.0000 mg | ORAL_TABLET | Freq: Four times a day (QID) | ORAL | Status: DC | PRN
  Administered 2022-03-11 – 2022-03-22 (×3): 4 mg
  Filled 2022-03-03 (×3): qty 1

## 2022-03-03 MED ORDER — IPRATROPIUM-ALBUTEROL 0.5-2.5 (3) MG/3ML IN SOLN: 3.0000 mL | RESPIRATORY_TRACT | Status: DC | PRN

## 2022-03-03 MED ORDER — RIVAROXABAN 10 MG PO TABS
10.0000 mg | ORAL_TABLET | Freq: Every day | ORAL | Status: DC
  Administered 2022-03-04 – 2022-03-22 (×18): 10 mg
  Filled 2022-03-03 (×18): qty 1

## 2022-03-03 MED ORDER — ALLOPURINOL 300 MG PO TABS
300.0000 mg | ORAL_TABLET | Freq: Every day | ORAL | Status: DC
  Administered 2022-03-04 – 2022-03-23 (×18): 300 mg
  Filled 2022-03-03 (×19): qty 1

## 2022-03-03 MED ORDER — OXYCODONE HCL 5 MG PO TABS
5.0000 mg | ORAL_TABLET | Freq: Four times a day (QID) | ORAL | Status: DC | PRN
  Administered 2022-03-04: 5 mg
  Filled 2022-03-03 (×2): qty 1

## 2022-03-03 MED ORDER — FREE WATER
100.0000 mL | Status: DC
  Administered 2022-03-03 – 2022-03-22 (×106): 100 mL

## 2022-03-03 MED ORDER — THIAMINE MONONITRATE 100 MG PO TABS
100.0000 mg | ORAL_TABLET | Freq: Every day | ORAL | Status: DC
  Administered 2022-03-03 – 2022-03-23 (×20): 100 mg
  Filled 2022-03-03 (×19): qty 1

## 2022-03-03 MED ORDER — FOLIC ACID 1 MG PO TABS
1.0000 mg | ORAL_TABLET | Freq: Every day | ORAL | Status: DC
  Administered 2022-03-03 – 2022-03-23 (×19): 1 mg
  Filled 2022-03-03 (×20): qty 1

## 2022-03-03 MED ORDER — CHLORHEXIDINE GLUCONATE CLOTH 2 % EX PADS
6.0000 | MEDICATED_PAD | Freq: Every day | CUTANEOUS | Status: DC
  Administered 2022-03-04 – 2022-03-23 (×13): 6 via TOPICAL

## 2022-03-03 MED ORDER — DEXTROSE 5 % IV SOLN: INTRAVENOUS | Status: DC

## 2022-03-03 MED ORDER — VITAL AF 1.2 CAL PO LIQD
1000.0000 mL | ORAL | Status: DC
  Administered 2022-03-03 – 2022-03-20 (×9): 1000 mL

## 2022-03-03 MED ORDER — ONDANSETRON HCL 4 MG/2ML IJ SOLN
4.0000 mg | Freq: Four times a day (QID) | INTRAMUSCULAR | Status: DC | PRN
  Administered 2022-03-07: 4 mg via INTRAVENOUS
  Filled 2022-03-03: qty 2

## 2022-03-03 MED ORDER — POTASSIUM PHOSPHATES 15 MMOLE/5ML IV SOLN
30.0000 mmol | Freq: Once | INTRAVENOUS | Status: AC
  Administered 2022-03-03: 30 mmol via INTRAVENOUS
  Filled 2022-03-03: qty 10

## 2022-03-03 NOTE — ED Notes (Signed)
Lunch tray given. 

## 2022-03-03 NOTE — NC FL2 (Signed)
[place] LEVEL OF CARE SCREENING TOOL     IDENTIFICATION  Patient Name: Trevor [name] Birthdate: [birth year] Sex: male Admission Date (Current Location): 03/01/2022  [place] and [place] Number:  [place] [id number] Facility and Address:  [facility] [address] [place]      Provider Number: [id number]  Attending Physician Name and Address:  Murlean Iba, MD  Relative Name and Phone Number:  [email or web address],abdur (Brother) [phone]    Current Level of Care: Hospital Recommended Level of Care: [place] Prior Approval Number:    Date Approved/Denied:   PASRR Number: [id number]  Discharge Plan: SNF    Current Diagnoses: Patient Active Problem List   Diagnosis Date Noted   Hypernatremia 03/01/2022   Falls frequently 03/01/2022   Gait instability 03/01/2022   FTT (failure to thrive) in adult 02/15/2022   Hypotension 02/14/2022   AKI (acute kidney injury) ([place]) 61/60/7371   Metabolic acidosis 10/18/9483   Hypoglycemia 02/14/2022   Nausea & vomiting 02/14/2022   PEG (percutaneous endoscopic gastrostomy) status ([place]) 02/14/2022   Hyponatremia 01/23/2022   Pulmonary nodules/lesions, multiple    Cavitary pneumonia/?? 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 ([place]) 09/13/2021   Visual disturbance 08/18/2021   Dysphagia 08/18/2021   Vitamin D deficiency 05/31/2021   Hypomagnesemia 05/31/2021   Hypokalemia 05/30/2021   HTN (hypertension) 05/30/2021   Tachycardia, unspecified 05/30/2021   Gout 05/30/2021   GERD (gastroesophageal reflux disease) 05/30/2021   Alcohol abuse 05/30/2021   Tobacco abuse 05/30/2021    Orientation RESPIRATION BLADDER Height & Weight     Self, Time, Situation, Place  Normal Continent Weight: 94 lb  (42.6 kg) Height:  '5\' 6"'$  (167.6 cm)  BEHAVIORAL SYMPTOMS/MOOD NEUROLOGICAL BOWEL NUTRITION STATUS      Continent Diet, Feeding tube (regular)  AMBULATORY STATUS COMMUNICATION OF NEEDS Skin   Extensive Assist Verbally Normal                       Personal Care Assistance Level of Assistance  Bathing, Feeding, Dressing Bathing Assistance: Maximum assistance Feeding assistance: Limited assistance (PEG TUBE) Dressing Assistance: Maximum assistance     Functional Limitations Info  Sight, Hearing, Speech Sight Info: Adequate Hearing Info: Adequate Speech Info: Adequate    SPECIAL CARE FACTORS FREQUENCY  PT (By licensed PT)     PT Frequency: 5x/week              Contractures Contractures Info: Not present    Additional Factors Info  Code Status, Allergies Code Status Info: Full Code Allergies Info: Pork-derived products           Current Medications (03/03/2022):  This is the current hospital active medication list Current Facility-Administered Medications  Medication Dose Route Frequency Provider Last Rate Last Admin   acetaminophen (TYLENOL) tablet 650 mg  650 mg Oral Q6H PRN Johnson, Clanford L, MD       Or   acetaminophen (TYLENOL) suppository 650 mg  650 mg Rectal Q6H PRN Johnson, Clanford L, MD       allopurinol (ZYLOPRIM) tablet 300 mg  300 mg Oral Daily Johnson, Clanford L, MD   300 mg at 03/03/22 1119   dextrose 5 % solution   Intravenous Continuous Wynetta Emery, Clanford L, MD 125 mL/hr at 03/03/22 1025 New Bag at  03/03/22 1025   fentaNYL (SUBLIMAZE) injection 12.5 mcg  12.5 mcg Intravenous Q2H PRN Johnson, Clanford L, MD       folic acid (FOLVITE) tablet 1 mg  1 mg Per Tube Daily Johnson, Clanford L, MD   1 mg at 03/03/22 1305   free water 125 mL  125 mL Per Tube Q4H Johnson, Clanford L, MD   125 mL at 03/03/22 1305   gabapentin (NEURONTIN) 250 MG/5ML solution 300 mg  300 mg Per Tube QHS Johnson, Clanford L, MD   300 mg at 03/02/22 2306    ipratropium-albuterol (DUONEB) 0.5-2.5 (3) MG/3ML nebulizer solution 3 mL  3 mL Nebulization BID Johnson, Clanford L, MD   3 mL at 03/03/22 0803   megestrol (MEGACE) 400 MG/10ML suspension 400 mg  400 mg Per Tube BID Wynetta Emery, Clanford L, MD   400 mg at 03/03/22 1114   nicotine (NICODERM CQ - dosed in mg/24 hours) patch 14 mg  14 mg Transdermal Daily PRN Johnson, Clanford L, MD       ondansetron (ZOFRAN) tablet 4 mg  4 mg Oral Q6H PRN Johnson, Clanford L, MD       Or   ondansetron (ZOFRAN) injection 4 mg  4 mg Intravenous Q6H PRN Johnson, Clanford L, MD       oxyCODONE (Oxy IR/ROXICODONE) immediate release tablet 5 mg  5 mg Oral Q6H PRN Johnson, Clanford L, MD       potassium chloride (KLOR-CON) packet 40 mEq  40 mEq Per Tube BID Johnson, Clanford L, MD   40 mEq at 03/03/22 1031   rivaroxaban (XARELTO) tablet 10 mg  10 mg Oral Q supper Johnson, Clanford L, MD   10 mg at 03/02/22 1742   saccharomyces boulardii (FLORASTOR) capsule 250 mg  250 mg Per Tube BID Johnson, Clanford L, MD   250 mg at 03/03/22 1120   thiamine (VITAMIN B1) tablet 100 mg  100 mg Per Tube Daily Johnson, Clanford L, MD   100 mg at 03/03/22 1305   Current Outpatient Medications  Medication Sig Dispense Refill   albuterol (VENTOLIN HFA) 108 (90 Base) MCG/ACT inhaler Inhale 1-2 puffs into the lungs every 6 (six) hours as needed for wheezing or shortness of breath. 1 each 0   allopurinol (ZYLOPRIM) 300 MG tablet Take 1 tablet (300 mg total) by mouth daily. 30 tablet 5   amLODipine (NORVASC) 5 MG tablet Take 1 tablet (5 mg total) by mouth daily. 90 tablet 2   fluorouracil CALGB 03212 2,400 mg/m2 in sodium chloride 0.9 % 150 mL Inject 2,400 mg/m2 into the vein over 48 hr. Every 2 weeks     FLUOROURACIL IV Inject into the vein every 14 (fourteen) days.     gabapentin (NEURONTIN) 250 MG/5ML solution Place 6 mLs (300 mg total) into feeding tube at bedtime. 470 mL 2   ibuprofen (ADVIL) 200 MG tablet Take 200 mg by mouth every 6 (six)  hours as needed for moderate pain.     KLOR-CON M20 20 MEQ tablet TAKE 2 TABLETS BY MOUTH DAILY 60 tablet 3   magnesium oxide (MAG-OX) 400 (240 Mg) MG tablet Take 1 tablet (400 mg total) by mouth 2 (two) times daily. (Patient taking differently: Take 1 tablet by mouth daily.) 60 tablet 2   megestrol (MEGACE) 400 MG/10ML suspension Take 10 mLs (400 mg total) by mouth 2 (two) times daily. 480 mL 3   Nutritional Supplements (FEEDING SUPPLEMENT, OSMOLITE 1.5 CAL,) LIQD Place 237 mLs into feeding tube  5 (five) times daily. 35550 mL 1   OXALIPLATIN IV Inject into the vein every 14 (fourteen) days.     prochlorperazine (COMPAZINE) 10 MG tablet Take 1 tablet (10 mg total) by mouth every 6 (six) hours as needed for nausea or vomiting. 30 tablet 6   saccharomyces boulardii (FLORASTOR) 250 MG capsule Take 1 capsule (250 mg total) by mouth 2 (two) times daily. 180 capsule 0   Vitamin D, Ergocalciferol, (DRISDOL) 1.25 MG (50000 UNIT) CAPS capsule Take 1 capsule (50,000 Units total) by mouth every Monday. 12 capsule 0   LEUCOVORIN CALCIUM IV Inject into the vein every 14 (fourteen) days.     Water For Irrigation, Sterile (FREE WATER) SOLN Place 120 mLs into feeding tube 6 (six) times daily. (Patient not taking: Reported on 02/14/2022)     Facility-Administered Medications Ordered in Other Encounters  Medication Dose Route Frequency Provider Last Rate Last Admin   palonosetron (ALOXI) 0.25 MG/5ML injection              Discharge Medications: Please see discharge summary for a list of discharge medications.  Relevant Imaging Results:  Relevant Lab Results:   Additional Information SSN [id number]  Trevor [name], Clydene Pugh, LCSW

## 2022-03-03 NOTE — Progress Notes (Signed)
Subjective:  Patient is feeling "okay" but is endorsing weakness. Patient expresses interest in going to a rehab facility in order to get stronger and improve is nutritional status. No acute events overnight.  Objective:  Vital signs in last 24 hours: Vitals:   03/03/22 1115 03/03/22 1117 03/03/22 1124 03/03/22 1300  BP: 114/78  114/78 109/83  Pulse:   84   Resp: _0 Temp:  97.8 F (36.6 C)    TempSrc:  Oral    SpO2:  99% 99% 99%  Weight:      Height:       Weight change:   Intake/Output Summary (Last 24 hours) at 03/03/2022 1358 Last data filed at 03/03/2022 1305 Gross per 24 hour  Intake 2804.25 ml  Output --  Net 2804.25 ml   Physical Examination  General: emaciated, lying in bed, in no acute distress Head: normocephalic, atraumatic Cardiovascular: regular rate and rhythm without murmurs, rubs, or gallops, no LEE Respiratory: normal respiratory effort, lungs CTAB Abdominal: PEG tube in place, normoactive bowel sounds, no tenderness to palpation Skin: warm, dry, no lesions Central Nervous System: alert and oriented x 3 Psychiatric: Normal mood and affect     Assessment/Plan:  Principal Problem:   Hypernatremia Active Problems:   Hypokalemia   HTN (hypertension)   Tachycardia, unspecified   Gout   GERD (gastroesophageal reflux disease)   Alcohol abuse   Tobacco abuse   Dysphagia   Stage IV B Squamous cell esophageal cancer (HCC)   Protein-calorie malnutrition, severe   AKI (acute kidney injury) ([place])   PEG (percutaneous endoscopic gastrostomy) status (HCC)   FTT (failure to thrive) in adult   Falls frequently   Gait instability   Hypernatremia / Severe Dehydration  - He has severe free water deficiency - Receiving D5W infusion (35 ml/hr) and free water (100 ml/hr) -Sodium normalized as of this morning  Hypokalemia Secondary to poor oral intake. Patient states that the Osmolite which was prescribed last month gives him "terrible  diarrhea" so he has not been compliant with it. 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 Delton Coombes - palliative care met with him during last admission and he remained in denial about the severity of illness and wanted to be full code/full scope care  - follow up with Dr. Delton Coombes for ongoing discussion   Frequent Falls - likely exacerbated by severe electrolyte derangement  - fall precautions recommended -PT ordered  Failure to thrive  - Pt seems unable to care for self at home - consult to social worker who is looking for SNF placement   Severe protein calorie malnutrition - resume regular diet plus PEG tube feeds  - Phosphorous is 2.0. (1.9 yesterday). -Replete again today.   AKI  - prerenal from severe dehydration  Resolved   Tobacco  - nicotine patch ordered - counseled patient on tobacco cessation    Chronic nausea and vomiting - secondary to esophageal malignancy - treat supportively   History of cavitary pneumonia - completed a 4 week course of augmentin    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  - gabapentin    Leukocytosis  - pt completed 4 week course of augmentin  for cavitary pneumonia treatment -Stable the last two days at 12,000. Afebrile and no overt signs of infection. -Monitor CBC       LOS: 1 day   Stanford Breed, Medical Student 03/03/2022, 1:58 PM

## 2022-03-03 NOTE — Plan of Care (Signed)
  Problem: Acute Rehab PT Goals(only PT should resolve) Goal: Pt Will Go Supine/Side To Sit Outcome: Progressing Flowsheets (Taken 03/03/2022 1513) Pt will go Supine/Side to Sit: with min guard assist Goal: Patient Will Transfer Sit To/From Stand Outcome: Progressing Flowsheets (Taken 03/03/2022 1513) Patient will transfer sit to/from stand:  with minimal assist  with min guard assist Goal: Pt Will Transfer Bed To Chair/Chair To Bed Outcome: Progressing Flowsheets (Taken 03/03/2022 1513) Pt will Transfer Bed to Chair/Chair to Bed:  min guard assist  with min assist Goal: Pt Will Ambulate Outcome: Progressing Flowsheets (Taken 03/03/2022 1513) Pt will Ambulate:  50 feet  with min guard assist  with minimal assist  with rolling walker   3:13 PM, 03/03/22 [name], MPT Physical Therapist with [facility] [phone] office [phone] mobile phone

## 2022-03-03 NOTE — Progress Notes (Signed)
PT Cancellation Note  Patient Details Name: Trevor [name] MRN: [id number] DOB: 11-30-1970   Cancelled Treatment:    Reason Eval/Treat Not Completed: Patient declined, no reason specified.  Patient refused due to c/o fatigue, will check back in PM.   9:31 AM, 03/03/22 [name], MPT Physical Therapist with [facility] [phone] office [phone] mobile phone

## 2022-03-03 NOTE — ED Notes (Signed)
AC notified for pt's dextrose 5%

## 2022-03-03 NOTE — Progress Notes (Signed)
Patient assessment of skin somewhat limited due to patient preference. When attempting to assess abrasion to his hip, patient covered himself up and said "that's just a scratch". Did request some dressings to his legs and elbows. States that he had fallen recently. Got up with physical therapy but wanted to get back into bed after the therapist left the room. Primary nurse coaxed him to sit up for a little while longer before ultimately getting back in the bed.

## 2022-03-03 NOTE — ED Notes (Signed)
Breakfast tray given. °

## 2022-03-03 NOTE — ED Notes (Signed)
Dressing changed to peg tube

## 2022-03-03 NOTE — TOC Initial Note (Addendum)
Transition of Care [facility]) - Initial/Assessment Note    Patient Details  Name: Trevor [name] MRN: [id number] Date of Birth: 06-20-1970  [facility] CM/SW Contact:    Iona Beard, [place] Phone Number: 03/03/2022, 1:35 PM  Clinical Narrative:                 CSW met with pt at bedside to speak about D/C planning. Pt now wishes for SNF as he would like to get stronger. CSW explained that due to insurance he may not be able to go to SNF in the area. Pt would like to try for SNF locally first as family is in the area. CSW explained that referral will be sent out and we will follow up. Pt is agreeable to this. Fl2 and PASRR completed. TOC to follow.   Addendum 2:44pm: CSW met with pt to review bed offers. Pt has offer at [facility]. Pt would like to accept the bed offer. CSW updated HUB to reflect this. CSW left HIPAA compliant VM for Debbie in admissions at [facility] to have auth started and benefits reviewed. TOC to follow.   Expected Discharge Plan: Skilled Nursing Facility Barriers to Discharge: Continued Medical Work up   Patient Goals and CMS Choice Patient states their goals for this hospitalization and ongoing recovery are:: go to SNF CMS Medicare.gov Compare Post Acute Care list provided to:: Patient Choice offered to / list presented to : Patient  Expected Discharge Plan and Services Expected Discharge Plan: [place] In-house Referral: Clinical Social Work Discharge Planning Services: CM Consult Post Acute Care Choice: [place] Living arrangements for the past 2 months: Apartment Expected Discharge Date: 03/03/22                                    Prior Living Arrangements/Services Living arrangements for the past 2 months: Apartment Lives with:: Self Patient language and need for interpreter reviewed:: Yes Do you feel safe going back to the place where you live?: Yes      Need for Family Participation in Patient Care: Yes  (Comment) Care giver support system in place?: Yes (comment)   Criminal Activity/Legal Involvement Pertinent to Current Situation/Hospitalization: No - Comment as needed  Activities of Daily Living      Permission Sought/Granted                  Emotional Assessment Appearance:: Appears stated age Attitude/Demeanor/Rapport: Engaged Affect (typically observed): Accepting Orientation: : Oriented to Self, Oriented to Place, Oriented to  Time, Oriented to Situation Alcohol / Substance Use: Not Applicable Psych Involvement: No (comment)  Admission diagnosis:  Hypernatremia [E87.0] Patient Active Problem List   Diagnosis Date Noted   Hypernatremia 03/01/2022   Falls frequently 03/01/2022   Gait instability 03/01/2022   FTT (failure to thrive) in adult 02/15/2022   Hypotension 02/14/2022   AKI (acute kidney injury) ([place]) 47/82/9562   Metabolic acidosis 13/11/6576   Hypoglycemia 02/14/2022   Nausea & vomiting 02/14/2022   PEG (percutaneous endoscopic gastrostomy) status ([place]) 02/14/2022   Hyponatremia 01/23/2022   Pulmonary nodules/lesions, multiple    Cavitary pneumonia/?? 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: [phone]: [phone]    Social Determinants of Health (SDOH) Interventions    Readmission Risk Interventions    03/03/2022   11:46 AM 01/24/2022    3:13 PM  Readmission Risk Prevention Plan  Transportation Screening Complete Complete  PCP or Specialist Appt within 3-5 Days  Complete  HRI or HFaith Complete  Social Work Consult for RFairviewPlanning/Counseling  Complete  Palliative Care Screening  Complete  Medication Review (Press photographer Complete Complete  HRI or H[place]Complete   SW Recovery Care/Counseling Consult Complete   Palliative Care Screening Complete   [place]dalePatient Refused

## 2022-03-03 NOTE — Progress Notes (Signed)
03/03/2022 11:29 AM  I had a discussion with patient about discharge plan.  He is no longer requiring inpatient treatment.  He lives alone.  He needs more assistance at home.  I recommended he go into long term care SNF.  He absolutely refuses.  He refused to allow a PT evaluation.  He is refusing care in the hospital. He refuses tube feedings and water flushes.  He says he will go only go home to his apartment.  I explained to him that he remains HIGH FALL RISK but he refuses any other consideration other than to go home.  I told him the only other option that we can provide is to request home hospice care.  He is agreeable to that.  I spoke with TOC and plan is to discharge home today with home hospice.  Unfortunately he is high risk for adverse outcome as he refuses to accept the care that he needs. I have asked him to be sure to call his hospice nurse first before calling 911 or returning to hospital.  He verbalized understanding.  Murvin Natal, MD

## 2022-03-03 NOTE — Progress Notes (Addendum)
Initial Nutrition Assessment  DOCUMENTATION CODES:   Severe malnutrition in context of chronic illness  INTERVENTION:  Initiate Vital 1.2 @ 20 ml/hr via PEG and increase by 10 ml every 24 hours to goal rate of 60 ml/hr.   Tube feeding regimen provides 1728 kcal, 108 grams of protein, and 1168 ml of H2O.    Monitor magnesium, potassium, and phosphorus BID for at least 3 days, MD to replete as needed, as pt is at risk for refeeding syndrome given patients malnourished state.  NUTRITION DIAGNOSIS:   Severe Malnutrition related to chronic illness (esophageal cancer- Stage IV) as evidenced by severe muscle depletion, severe fat depletion.   GOAL:  Patient will meet greater than or equal to 90% of their needs   MONITOR:  PO intake, Labs, Weight trends, TF tolerance  REASON FOR ASSESSMENT:   Consult Assessment of nutrition requirement/status  ASSESSMENT: [name], PEG tube, severe malnutrition, and dehydrated at admission.   [name] No other food items consumed. Poor dentition noted.   [name] Change formula to elemental profile and if diarrhea persists will add Banatrol TID via PEG. Discussed with clinical nutrition team leaders  as well as attending team. Advance TF gradually due to refeeding risk.  Medications: thiamine, folic acid, megace, klor-con, florastor.   [name] Unplanned loss of 10% < 3 months which is significant.   IVF-D5@ 125 ml/hr      Latest Ref Rng & Units 03/03/2022    2:45 AM 03/02/2022    3:26 AM 03/01/2022   11:18 AM  BMP  Glucose 70 - 99 mg/dL 102  125  94   BUN 6 - 20 mg/dL 30  47  53   Creatinine 0.61 - 1.24 mg/dL 0.80  1.22  1.32   Sodium 135 - 145 mmol/L 144  156  159   Potassium 3.5 - 5.1 mmol/L 4.1  3.4   3.2   Chloride 98 - 111 mmol/L 123  >130  >130   CO2 22 - 32 mmol/L '19  21  19   '$ Calcium 8.9 - 10.3 mg/dL 8.7  9.1  8.7       NUTRITION - FOCUSED PHYSICAL EXAM:  Flowsheet Row Most Recent Value  Orbital Region Severe depletion  Upper Arm Region Severe depletion  Thoracic and Lumbar Region Severe depletion  Buccal Region Severe depletion  Temple Region Severe depletion  Clavicle Bone Region Severe depletion  Clavicle and Acromion Bone Region Severe depletion  Dorsal Hand Moderate depletion  Patellar Region Severe depletion  Anterior Thigh Region Severe depletion  Posterior Calf Region Severe depletion  Edema (RD Assessment) Mild  Hair Reviewed  Eyes Reviewed  Mouth Reviewed  Skin Reviewed  Nails Reviewed       Diet Order:   Diet Order             Diet regular Room service appropriate? Yes; Fluid consistency: Thin  Diet effective now                   EDUCATION NEEDS:  Education needs have been addressed  Skin:  Skin Assessment: Reviewed RN Assessment (PEG tube- 20 french LUQ)  Last BM:  11/10  Height:   Ht Readings from Last 1 Encounters:  03/01/22 5'  6" (1.676 m)    Weight:   Wt Readings from Last 1 Encounters:  03/01/22 42.6 kg    Ideal Body Weight:   65 kg  BMI:  Body mass index is 15.17 kg/m.  Estimated Nutritional Needs:   Kcal:  1800-2000  Protein:  88-100 gr  Fluid:  >1400 ml daily  Colman Cater MS,RD,CSG,LDN Contact: Shea Evans

## 2022-03-03 NOTE — Evaluation (Signed)
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 He has frequent falls at home worse in last couple of days. He is dizzy all the time.  He is not doing water flushes in PEG and continues to smoke.  He refuses SNF.  His chemotherapy was placed on hold due to severe weight loss.  He does not accept that he is dying. He wants to remain full code.  He demands to be admitted to [facility], adamantly refusing to stay at [facility].  He was noted to have an AKI with severe hypernatremia consistent with worsening volume and nutritional status.  He was agreeable to IV fluids overnight but wants to be discharged home tomorrow.   He again refuses to be admitted to [facility].   Clinical Impression  Patient demonstrates slow labored movement for sitting up at bedside, very unsteady on feet with inability to maintain standing balance without AD, required use of RW for safety and limited to a few side steps before having to sit due to generalized weakness and poor standing balance.  Patient tolerated sitting up in chair after therapy - nursing staff aware (chair alarm on).  Patient will benefit from continued skilled physical therapy in hospital and recommended venue below to increase strength, balance, endurance for safe ADLs and gait.          Recommendations for follow up therapy are one component of a multi-disciplinary discharge planning process, led by the attending physician.  Recommendations may be updated based on patient status, additional functional criteria and insurance authorization.  Follow Up  Recommendations Skilled nursing-short term rehab (<3 hours/day) Can patient physically be transported by private vehicle: Yes    Assistance Recommended at Discharge Intermittent Supervision/Assistance  Patient can return home with the following  A lot of help with bathing/dressing/bathroom;A lot of help with walking and/or transfers;Help with stairs or ramp for entrance;Assistance with cooking/housework    Equipment Recommendations Rolling walker (2 wheels)  Recommendations for Other Services       Functional Status Assessment Patient has had a recent decline in their functional status and demonstrates the ability to make significant improvements in function in a reasonable and predictable amount of time.     Precautions / Restrictions Precautions Precautions: Fall Restrictions Weight Bearing Restrictions: No      Mobility  Bed Mobility Overal bed mobility: Needs Assistance Bed Mobility: Supine to Sit     Supine to sit: Min assist     General bed mobility comments: increased time, labored movement    Transfers Overall transfer level: Needs assistance Equipment used: Rolling walker (2 wheels) Transfers: Sit to/from Stand, Bed to chair/wheelchair/BSC Sit to Stand: Min assist   Step pivot transfers: Min assist       General transfer comment: unsteady labored movement    Ambulation/Gait Ambulation/Gait assistance: Mod assist Gait Distance (Feet): 5 Feet Assistive device: Rolling walker (2 wheels) Gait Pattern/deviations: Decreased step length - right, Decreased step length - left, Decreased stride length Gait velocity: decreased     General Gait Details: limited to a few slow labored side steps before having to sit due to fatigue  Stairs  Wheelchair Mobility    Modified Rankin (Stroke Patients Only)       Balance Overall balance assessment: Needs assistance Sitting-balance support: Feet supported, No upper extremity supported Sitting  balance-Leahy Scale: Fair Sitting balance - Comments: fair/good seated at EOB   Standing balance support: During functional activity, No upper extremity supported Standing balance-Leahy Scale: Poor Standing balance comment: fair using RW                             Pertinent Vitals/Pain Pain Assessment Pain Assessment: No/denies pain    Home Living Family/patient expects to be discharged to:: Private residence Living Arrangements: Alone Available Help at Discharge: Family;Available PRN/intermittently Type of Home: Apartment Home Access: Stairs to enter Entrance Stairs-Rails: Right Entrance Stairs-Number of Steps: 6   Home Layout: One level Home Equipment: Conservation officer, nature (2 wheels);Cane - single point      Prior Function Prior Level of Function : Independent/Modified Independent             Mobility Comments: rapid decline, multiple recent falls, some associated with syncope. Pt ambulates with use of a RW or SPC ADLs Comments: sponge bathing and simple meals for the last few weeks due to fatigue     Hand Dominance        Extremity/Trunk Assessment   Upper Extremity Assessment Upper Extremity Assessment: Generalized weakness    Lower Extremity Assessment Lower Extremity Assessment: Generalized weakness    Cervical / Trunk Assessment Cervical / Trunk Assessment: Normal  Communication   Communication: No difficulties  Cognition Arousal/Alertness: Awake/alert Behavior During Therapy: WFL for tasks assessed/performed Overall Cognitive Status: Within Functional Limits for tasks assessed                                          General Comments      Exercises     Assessment/Plan    PT Assessment Patient needs continued PT services  PT Problem List Decreased strength;Decreased activity tolerance;Decreased balance;Decreased mobility       PT Treatment Interventions DME instruction;Gait training;Functional mobility  training;Stair training;Therapeutic activities;Therapeutic exercise;Patient/family education;Balance training    PT Goals (Current goals can be found in the Care Plan section)  Acute Rehab PT Goals Patient Stated Goal: return home after rehab PT Goal Formulation: With patient Time For Goal Achievement: 03/17/22 Potential to Achieve Goals: Good    Frequency Min 3X/week     Co-evaluation               AM-PAC PT "6 Clicks" Mobility  Outcome Measure Help needed turning from your back to your side while in a flat bed without using bedrails?: None Help needed moving from lying on your back to sitting on the side of a flat bed without using bedrails?: A Little Help needed moving to and from a bed to a chair (including a wheelchair)?: A Lot Help needed standing up from a chair using your arms (e.g., wheelchair or bedside chair)?: A Little Help needed to walk in hospital room?: A Lot Help needed climbing 3-5 steps with a railing? : A Lot 6 Click Score: 16    End of Session   Activity Tolerance: Patient tolerated treatment well;Patient limited by fatigue Patient left: in chair;with call bell/phone within reach;with chair alarm set;with nursing/sitter in room Nurse Communication: Mobility status PT Visit Diagnosis: Other abnormalities of gait and  mobility (R26.89);Muscle weakness (generalized) (M62.81);History of falling (Z91.81);Unsteadiness on feet (R26.81)    Time: 7011-0034 PT Time Calculation (min) (ACUTE ONLY): 27 min   Charges:   PT Evaluation $PT Eval Moderate Complexity: 1 Mod PT Treatments $Therapeutic Activity: 23-37 mins        3:11 PM, 03/03/22 [name], MPT Physical Therapist with [facility] [phone] office [phone] mobile phone

## 2022-03-04 DIAGNOSIS — E870 Hyperosmolality and hypernatremia: Secondary | ICD-10-CM | POA: Diagnosis not present

## 2022-03-04 DIAGNOSIS — R296 Repeated falls: Secondary | ICD-10-CM | POA: Diagnosis not present

## 2022-03-04 DIAGNOSIS — N179 Acute kidney failure, unspecified: Secondary | ICD-10-CM | POA: Diagnosis not present

## 2022-03-04 DIAGNOSIS — R1310 Dysphagia, unspecified: Secondary | ICD-10-CM | POA: Diagnosis not present

## 2022-03-04 LAB — BASIC METABOLIC PANEL
Anion gap: 5 (ref 5–15)
BUN: 20 mg/dL (ref 6–20)
CO2: 19 mmol/L — ABNORMAL LOW (ref 22–32)
Calcium: 8.3 mg/dL — ABNORMAL LOW (ref 8.9–10.3)
Chloride: 115 mmol/L — ABNORMAL HIGH (ref 98–111)
Creatinine, Ser: 0.73 mg/dL (ref 0.61–1.24)
GFR, Estimated: >60 mL/min (ref >60)
Glucose, Bld: 86 mg/dL (ref 70–99)
Potassium: 4.7 mmol/L (ref 3.5–5.1)
Sodium: 139 mmol/L (ref 135–145)

## 2022-03-04 LAB — MAGNESIUM: Magnesium: 1.6 mg/dL — ABNORMAL LOW (ref 1.7–2.4)

## 2022-03-04 LAB — PHOSPHORUS: Phosphorus: 3.9 mg/dL (ref 2.5–4.6)

## 2022-03-04 MED ORDER — MAGNESIUM SULFATE 4 GM/100ML IV SOLN
4.0000 g | Freq: Once | INTRAVENOUS | Status: AC
  Administered 2022-03-04: 4 g via INTRAVENOUS
  Filled 2022-03-04: qty 100

## 2022-03-04 NOTE — Progress Notes (Signed)
PROGRESS NOTE   Trevor [name]  HER:[id number] DOB: 08-28-1970 DOA: 03/01/2022 PCP: [name], [name]   Chief Complaint  Patient presents with   Weakness    N/V fell today and yesterday, states wanted to go to [facility] feeding tube and HX esophageal cancer.   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 He has frequent falls at home worse in last couple of days. He is dizzy all the time.  He is not doing water flushes in PEG and continues to smoke.  He refuses SNF.  His chemotherapy was placed on hold due to severe weight loss.  He does not accept that he is dying. He wants to remain full code.  He demands to be admitted to [facility], adamantly refusing to stay at [facility].  He was noted to have an AKI with severe hypernatremia consistent with worsening volume and nutritional status.  He was agreeable to IV fluids overnight but wants to be discharged home tomorrow.   He again refuses to be admitted to [facility].     Assessment and Plan:  Hypernatremia / Severe Dehydration  - He has severe free water deficiency - Receiving D5W infusion (35 ml/hr) and free water (100 ml/hr) -Sodium normalized as of this morning   Hypokalemia Secondary to poor oral intake. Patient states that the Osmolite which was prescribed last month gives him "terrible diarrhea" so he has not been compliant with it. Dietician consult placed and patient will be started on  - Magnesium is normal - replace potassium regularly (he is supposed to be taking daily liquid potassium) - follow BMP   Stage IV B Metastatic Squamous Cell Cancer - his chemotherapy was placed on temporary hold due to severe weight loss - he is  followed by Dr. Delton Coombes - palliative care met with him during last admission and he remained in denial about the severity of illness and wanted to be full code/full scope care  - follow up with Dr. Delton Coombes for ongoing discussion   Frequent Falls - likely exacerbated by severe electrolyte derangement  - fall precautions recommended - PT ordered   Failure to thrive  - Pt seems unable to care for self at home - consult to social worker who is looking for SNF placement   Severe protein calorie malnutrition - resume regular diet plus PEG tube feeds  - Phosphorous is 2.0.  - Phos has been repleted now up to 3.0   AKI Resolved - prerenal from severe dehydration     Tobacco  - nicotine patch ordered - counseled patient on tobacco cessation    Chronic nausea and vomiting - secondary to esophageal malignancy - treat supportively   History of cavitary pneumonia - completed a 4 week course of augmentin    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  - gabapentin    Leukocytosis  - pt completed 4 week course of augmentin for cavitary pneumonia treatment - Stable the last two days at 12,000. Afebrile and no overt signs of infection. - Monitor CBC   DVT prophylaxis: enox  Code Status: full  Family Communication: none avail Disposition: Status is: Inpatient Remains inpatient appropriate because: IV fluids and electrolytes    Consultants:   Procedures:   Antimicrobials:    Subjective:  Pt says that staff has treated him well.  He remains agreeable to SNF placement.   Objective: Vitals:   03/04/22 0531 03/04/22 0617 03/04/22 0915 03/04/22 1322  BP: 90/70   90/68  Pulse: 99   98  Resp: 17     Temp: 98.2 F (36.8 C)   98.6 F (37 C)  TempSrc: Oral   Oral  SpO2: 100%  100% 100%  Weight:  39.2 kg    Height:        Intake/Output Summary (Last 24 hours) at 03/04/2022 1446 Last data filed at 03/04/2022 1300 Gross per 24 hour  Intake 360 ml  Output 302 ml   Net 58 ml   Filed Weights   03/01/22 0746 03/04/22 0617  Weight: 42.6 kg 39.2 kg   Examination:  General exam: frail, emaciated appearing male, appears chronically ill, Appears calm and comfortable  Respiratory system: Clear to auscultation. Respiratory effort normal. Cardiovascular system: normal S1 & S2 heard. No JVD, murmurs, rubs, gallops or clicks. No pedal edema. Gastrointestinal system: Abdomen is nondistended, soft and nontender. No organomegaly or masses felt. Normal bowel sounds heard. Central nervous system: Alert and oriented. No focal neurological deficits. Extremities: Symmetric 5 x 5 power. Skin: No rashes, lesions or ulcers. Psychiatry: Judgement and insight appear normal. Mood & affect appropriate.   Data Reviewed: I have personally reviewed following labs and imaging studies  CBC: Recent Labs  Lab 03/01/22 0911 03/02/22 0326 03/03/22 0245  WBC 12.7* 12.6* 13.0*  NEUTROABS 9.3*  --   --   HGB 11.1* 9.0* 8.6*  HCT 35.3* 28.9* 26.9*  MCV 81.3 83.5 81.0  PLT 228 153 131*    Basic Metabolic Panel: Recent Labs  Lab 03/01/22 1118 03/02/22 0326 03/03/22 0245 03/04/22 0507  NA 159* 156* 144 139  K 3.2* 3.4* 4.1 4.7  CL >130* >130* 123* 115*  CO2 19* 21* 19* 19*  GLUCOSE 94 125* 102* 86  BUN 53* 47* 30* 20  CREATININE 1.32* 1.22 0.80 0.73  CALCIUM 8.7* 9.1 8.7* 8.3*  MG 2.0 2.0  --  1.6*  PHOS  --  1.9* 2.0* 3.9    CBG: No results for input(s): "GLUCAP" in the last 168 hours.  No results found for this or any previous visit (from the past 240 hour(s)).   Radiology Studies: DG Chest Portable 1 View  Result Date: 03/02/2022 CLINICAL DATA:  Issues with port EXAM: PORTABLE CHEST 1 VIEW COMPARISON:  02/14/2022, CT 02/14/2022 FINDINGS: Left-sided central venous port tip over the SVC. Tubing appears grossly continuous. No pleural effusion or pneumothorax. Stable cardiomediastinal silhouette. Probable skin fold artifact over the left chest. Multiple old  bilateral rib fractures. Mild nodularity in the right upper lobe, likely corresponds to tree-in-bud density on the prior CT. IMPRESSION: 1. Left-sided central venous port tip over the SVC. Port tubing appears grossly continuous. 2. Mild nodularity in the right upper lobe adjacent to the fissure, likely corresponds to tree-in-bud density on prior CT. Electronically Signed   By: Donavan Foil M.D.   On: 03/02/2022 23:43   CT HEAD WO CONTRAST (5MM)  Result Date: 03/02/2022 CLINICAL DATA:  Head trauma. EXAM: CT HEAD WITHOUT CONTRAST TECHNIQUE: Contiguous axial images were obtained from the base of the skull through the vertex without intravenous contrast. RADIATION DOSE REDUCTION: This exam was performed according to the departmental dose-optimization program which includes automated exposure control, adjustment of the mA and/or kV according to patient size and/or use of iterative reconstruction technique. COMPARISON:  CT Head 02/28/22 FINDINGS: Brain: No evidence of acute infarction, hemorrhage, hydrocephalus, extra-axial collection or mass lesion/mass effect. Sequela of mild chronic microvascular ischemic change. Vascular: No hyperdense vessel or unexpected calcification. Skull: Normal. Negative for fracture or focal lesion. Sinuses/Orbits: No acute finding. Other: None IMPRESSION: No acute intracranial abnormality. Electronically Signed   By: Marin Roberts M.D.   On: 03/02/2022 19:51    Scheduled Meds:  allopurinol  300 mg Per Tube Daily   Chlorhexidine Gluconate Cloth  6 each Topical Daily   folic acid  1 mg Per Tube Daily   free water  100 mL Per Tube Q4H   gabapentin  300 mg Per Tube QHS   ipratropium-albuterol  3 mL Nebulization BID   megestrol  400 mg Per Tube BID   potassium chloride  40 mEq Per Tube BID   rivaroxaban  10 mg Per Tube Q supper   saccharomyces boulardii  250 mg Per Tube BID   thiamine  100 mg Per Tube Daily   Continuous Infusions:  dextrose 35 mL/hr at 03/03/22 1524   feeding  supplement (VITAL AF 1.2 CAL) 1,000 mL (03/03/22 1525)     LOS: 2 days   Time spent: 41 mins  Dorien Mayotte Wynetta Emery, MD How to contact the [facility] Attending or Consulting provider [place] or covering provider during after hours [place], for this patient?  Check the care team in [facility] and look for a) attending/consulting TRH provider listed and b) the [facility] team listed Log into [email or web address] and use [place]'s universal password to access. If you do not have the password, please contact the hospital operator. Locate the [facility] provider you are looking for under [facility] and page to a number that you can be directly reached. If you still have difficulty reaching the provider, please page the [facility] (Director on Call) for the Hospitalists listed on amion for assistance.  03/04/2022, 2:46 PM  ]

## 2022-03-04 NOTE — Progress Notes (Signed)
Patient's G-tube leaked on the bed. The patient refused the changing of his sheets. Gown and pad was changed.

## 2022-03-05 DIAGNOSIS — E870 Hyperosmolality and hypernatremia: Secondary | ICD-10-CM | POA: Diagnosis not present

## 2022-03-05 DIAGNOSIS — Z720 Tobacco use: Secondary | ICD-10-CM

## 2022-03-05 DIAGNOSIS — N179 Acute kidney failure, unspecified: Secondary | ICD-10-CM | POA: Diagnosis not present

## 2022-03-05 DIAGNOSIS — R627 Adult failure to thrive: Secondary | ICD-10-CM

## 2022-03-05 LAB — GLUCOSE, CAPILLARY
Glucose-Capillary: 173 mg/dL — ABNORMAL HIGH (ref 70–99)
Glucose-Capillary: 130 mg/dL — ABNORMAL HIGH (ref 70–99)
Glucose-Capillary: 113 mg/dL — ABNORMAL HIGH (ref 70–99)

## 2022-03-05 LAB — MAGNESIUM: Magnesium: 2.8 mg/dL — ABNORMAL HIGH (ref 1.7–2.4)

## 2022-03-05 LAB — PHOSPHORUS: Phosphorus: 2.5 mg/dL (ref 2.5–4.6)

## 2022-03-05 NOTE — Progress Notes (Signed)
PROGRESS NOTE   Trevor [name]  MRN:[id number] DOB: 03/05/1971 DOA: 03/01/2022 PCP: Zarwolo, Gloria, [name]   Chief Complaint  Patient presents with   Weakness    N/V fell today and yesterday, states wanted to go to [place] feeding tube and HX esophageal cancer.   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 He has frequent falls at home worse in last couple of days. He is dizzy all the time.  He is not doing water flushes in PEG and continues to smoke.  He refuses SNF.  His chemotherapy was placed on hold due to severe weight loss.  He does not accept that he is dying. He wants to remain full code.  He demands to be admitted to [facility], adamantly refusing to stay at [facility].  He was noted to have an AKI with severe hypernatremia consistent with worsening volume and nutritional status.  He was agreeable to IV fluids overnight but wants to be discharged home tomorrow.   He again refuses to be admitted to [place].     Assessment and Plan:  Hypernatremia / Severe Dehydration  - He has severe free water deficiency - Receiving D5W infusion (35 ml/hr) and free water (100 ml q4h) -Sodium normalized after supplementing adequate free water   Hypokalemia Secondary to poor oral intake. Patient states that the Osmolite which was prescribed last month gives him "terrible diarrhea" so he has not been compliant with it. Dietician consult placed and patient will be started on  - Magnesium is normal - replace potassium regularly (he is supposed to be taking daily liquid potassium) - repleted    Stage IV B Metastatic Squamous Cell Cancer - his chemotherapy was placed on temporary hold due to severe weight  loss - he is followed by Dr. Katragadda - palliative care met with him during last admission and he remained in denial about the severity of illness and wanted to be full code/full scope care  - follow up with Dr. Katragadda for ongoing discussion   Frequent Falls - likely exacerbated by severe debility, malnutrition, weight loss - fall precautions recommended - PT ordered recommending SNF    Failure to thrive  - Pt seems unable to care for self at home - consult to social worker who is looking for SNF placement   Severe protein calorie malnutrition - resume regular diet plus PEG tube feeds  - Phosphorous is 2.0.  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  He remains agreeable to SNF placement.  He wants to try to gain weight so he can resume chemotherapy.   Objective: Vitals:   03/04/22 2032 03/04/22 2113 03/05/22 0535 03/05/22 0833  BP:  96/73 126/70   Pulse:  98 (!) 106   Resp:   18   Temp:  98.7 F (37.1 C) 99 F (37.2 C)   TempSrc:  Oral Oral   SpO2: 99% 100% 100% 99%   Weight:      Height:        Intake/Output Summary (Last 24 hours) at 03/05/2022 1133 Last data filed at 03/05/2022 0600 Gross per 24 hour  Intake 3539.94 ml  Output 1050 ml  Net 2489.94 ml   Filed Weights   03/01/22 0746 03/04/22 0617  Weight: 42.6 kg 39.2 kg   Examination:  General exam: frail, emaciated appearing male, appears chronically ill, Appears calm and comfortable  Respiratory system: Clear to auscultation. Respiratory effort normal. Cardiovascular system: normal S1 & S2 heard. No JVD, murmurs, rubs, gallops or clicks. No pedal edema. Gastrointestinal system: Abdomen is nondistended, soft and nontender. No organomegaly or masses felt. Normal bowel sounds heard. Central nervous system: Alert and oriented. No focal neurological deficits. Extremities: Symmetric 5 x 5 power. Skin: No rashes, lesions or ulcers. Psychiatry: Judgement and insight appear normal. Mood & affect appropriate.   Data Reviewed: I have personally reviewed following labs and imaging studies  CBC: Recent Labs  Lab 03/01/22 0911 03/02/22 0326 03/03/22 0245  WBC 12.7* 12.6* 13.0*  NEUTROABS 9.3*  --   --   HGB 11.1* 9.0* 8.6*  HCT 35.3* 28.9* 26.9*  MCV 81.3 83.5 81.0  PLT 228 153 131*    Basic Metabolic Panel: Recent Labs  Lab 03/01/22 1118 03/02/22 0326 03/03/22 0245 03/04/22 0507 03/05/22 0503  NA 159* 156* 144 139  --   K 3.2* 3.4* 4.1 4.7  --   CL >130* >130* 123* 115*  --   CO2 19* 21* 19* 19*  --   GLUCOSE 94 125* 102* 86  --   BUN 53* 47* 30* 20  --   CREATININE 1.32* 1.22 0.80 0.73  --   CALCIUM 8.7* 9.1 8.7* 8.3*  --   MG 2.0 2.0  --  1.6* 2.8*  PHOS  --  1.9* 2.0* 3.9 2.5    CBG: Recent Labs  Lab 03/05/22 0733 03/05/22 1113  GLUCAP 173* 130*    No results found for this or any previous visit (from the past 240 hour(s)).   Radiology Studies: No results found.  Scheduled Meds:  allopurinol  300 mg Per Tube Daily   Chlorhexidine Gluconate Cloth  6 each  Topical Daily   folic acid  1 mg Per Tube Daily   free water  100 mL Per Tube Q4H   gabapentin  300 mg Per Tube QHS   ipratropium-albuterol  3 mL Nebulization BID   megestrol  400 mg Per Tube BID   potassium chloride  40 mEq Per Tube BID   rivaroxaban  10 mg Per Tube Q supper   saccharomyces boulardii  250 mg Per Tube BID   thiamine  100 mg Per Tube Daily   Continuous Infusions:  dextrose 35 mL/hr at 03/05/22 0920   feeding supplement (VITAL AF 1.2 CAL) 60 mL/hr at 03/05/22 0600    LOS: 3 days   Time spent: 35 mins    Clanford Johnson, MD How to contact the TRH Attending or Consulting provider 7A - 7P or covering provider during after hours 7P -7A, for this patient?  Check the care team in [facility] and look for a) attending/consulting TRH provider listed and b) the TRH team listed Log into [email or web address] and use [place]'s universal password to access. If you do not have the password, please contact the hospital operator. Locate the TRH provider you are looking for under [facility] and page to a number that you can be directly reached. If you still have difficulty reaching the provider, please page the [facility] (Director on Call) for the Hospitalists listed on amion for assistance.  03/05/2022, 11:33 AM  ] 

## 2022-03-05 NOTE — Plan of Care (Signed)

## 2022-03-06 DIAGNOSIS — N179 Acute kidney failure, unspecified: Secondary | ICD-10-CM | POA: Diagnosis not present

## 2022-03-06 DIAGNOSIS — E870 Hyperosmolality and hypernatremia: Secondary | ICD-10-CM | POA: Diagnosis not present

## 2022-03-06 DIAGNOSIS — R627 Adult failure to thrive: Secondary | ICD-10-CM | POA: Diagnosis not present

## 2022-03-06 DIAGNOSIS — Z720 Tobacco use: Secondary | ICD-10-CM | POA: Diagnosis not present

## 2022-03-06 LAB — PHOSPHORUS: Phosphorus: 1.3 mg/dL — ABNORMAL LOW (ref 2.5–4.6)

## 2022-03-06 LAB — BASIC METABOLIC PANEL
Anion gap: 3 — ABNORMAL LOW (ref 5–15)
BUN: 13 mg/dL (ref 6–20)
CO2: 20 mmol/L — ABNORMAL LOW (ref 22–32)
Calcium: 8.4 mg/dL — ABNORMAL LOW (ref 8.9–10.3)
Chloride: 114 mmol/L — ABNORMAL HIGH (ref 98–111)
Creatinine, Ser: 0.69 mg/dL (ref 0.61–1.24)
GFR, Estimated: >60 mL/min (ref >60)
Glucose, Bld: 109 mg/dL — ABNORMAL HIGH (ref 70–99)
Potassium: 4.8 mmol/L (ref 3.5–5.1)
Sodium: 137 mmol/L (ref 135–145)

## 2022-03-06 LAB — MAGNESIUM: Magnesium: 2.1 mg/dL (ref 1.7–2.4)

## 2022-03-06 MED ORDER — POTASSIUM PHOSPHATES 15 MMOLE/5ML IV SOLN
30.0000 mmol | Freq: Once | INTRAVENOUS | Status: AC
  Administered 2022-03-06: 30 mmol via INTRAVENOUS
  Filled 2022-03-06: qty 10

## 2022-03-06 NOTE — TOC Progression Note (Signed)
Transition of Care [facility]) - Progression Note    Patient Details  Name: Yovan Leeman MRN: [id number] Date of Birth: May 03, 1970  Transition of Care [facility]) CM/SW Contact  Boneta Lucks, RN Phone Number: 03/06/2022, 2:41 PM  Clinical Narrative:   Per Jackelyn Poling from [facility], insurance authorization is still pending.   Expected Discharge Plan: [place] Barriers to Discharge: Continued Medical Work up  Expected Discharge Plan and Services Expected Discharge Plan: [place] In-house Referral: Clinical Social Work Discharge Planning Services: CM Consult Post Acute Care Choice: [place] Living arrangements for the past 2 months: Apartment Expected Discharge Date: 03/03/22                    Social Determinants of Health (SDOH) Interventions Housing Interventions: Intervention Not Indicated  Readmission Risk Interventions    03/03/2022   11:46 AM 01/24/2022    3:13 PM  Readmission Risk Prevention Plan  Transportation Screening Complete Complete  PCP or Specialist Appt within 3-5 Days  Complete  HRI or [place]  Complete  Social Work Consult for [place] Planning/Counseling  Complete  Palliative Care Screening  Complete  Medication Review Press photographer) Complete Complete  HRI or [place] Complete   SW Recovery Care/Counseling Consult Complete   Palliative Care Screening Complete   [place] Patient Refused

## 2022-03-06 NOTE — Progress Notes (Addendum)
Nutrition Follow up  DOCUMENTATION CODES:   Severe malnutrition in context of chronic illness  INTERVENTION:  Continue slowly advancing -Vital 1.2 @ 40 ml/hr via PEG and increase by 10 ml every 24 hours to goal rate of 60 ml/hr.   Tube feeding regimen provides 1728 kcal, 108 grams of protein, and 1168 ml of H2O.    Monitor magnesium, potassium, and phosphorus BID for at least 3 days, MD to replete as needed, as pt is at risk for refeeding syndrome given patients malnourished state. Continue to monitor until labs stabilize.   NUTRITION DIAGNOSIS:   Severe Malnutrition related to chronic illness (esophageal cancer- Stage IV) as evidenced by severe muscle depletion, severe fat depletion.   GOAL:  Patient will meet greater than or equal to 90% of their needs -ongoing  MONITOR:  PO intake, Labs, Weight trends, TF tolerance, stool frequency   ASSESSMENT: [name], PEG tube, severe malnutrition, and dehydrated at admission.   [name] No other food items consumed. Poor dentition noted.   [name] Change formula to elemental profile and if diarrhea persists will add Banatrol TID via PEG. Discussed with clinical nutrition team leaders  as well as attending team. Advance TF gradually due to refeeding risk.  Medications: thiamine, folic acid, megace, klor-con, florastor.   [name] Unplanned loss of 10% < 3 months which is significant.   [name] Magnesium, potassium and phosphorus being replaced as needed. Regular diet provided with minimal intake per nursing. Able to feed himself. His breakfast tray is here but untouched (2 egg sandwiches, milk, coffee). Talked with nursing.  Continue advancing tube feeding as noted  above. Patient asking for [facility] however, per nursing he is not currently having loose stools. Patient may discharge later today per report.     Latest Ref Rng & Units 03/06/2022    4:56 AM 03/04/2022    5:07 AM 03/03/2022    2:45 AM  BMP  Glucose 70 - 99 mg/dL 109  86  102   BUN 6 - 20 mg/dL '13  20  30   '$ Creatinine 0.61 - 1.24 mg/dL 0.69  0.73  0.80   Sodium 135 - 145 mmol/L 137  139  144   Potassium 3.5 - 5.1 mmol/L 4.8  4.7  4.1   Chloride 98 - 111 mmol/L 114  115  123   CO2 22 - 32 mmol/L '20  19  19   '$ Calcium 8.9 - 10.3 mg/dL 8.4  8.3  8.7       NUTRITION - FOCUSED PHYSICAL EXAM:  Flowsheet Row Most Recent Value  Orbital Region Severe depletion  Upper Arm Region Severe depletion  Thoracic and Lumbar Region Severe depletion  Buccal Region Severe depletion  Temple Region Severe depletion  Clavicle Bone Region Severe depletion  Clavicle and Acromion Bone Region Severe depletion  Dorsal Hand Moderate depletion  Patellar Region Severe depletion  Anterior Thigh Region Severe depletion  Posterior Calf Region Severe depletion  Edema (RD Assessment) Mild  Hair Reviewed  Eyes Reviewed  Mouth Reviewed  Skin Reviewed  Nails Reviewed      Diet Order:   Diet Order             Diet regular  Room service appropriate? Yes; Fluid consistency: Thin  Diet effective now                   EDUCATION NEEDS:  Education needs have been addressed  Skin:  Skin Assessment: Reviewed RN Assessment (PEG tube- 20 french LUQ)  Last BM:  11/11  Height:   Ht Readings from Last 1 Encounters:  03/01/22 '5\' 6"'$  (1.676 m)    Weight:   Wt Readings from Last 1 Encounters:  03/04/22 39.2 kg    Ideal Body Weight:   65 kg  BMI:  Body mass index is 13.95 kg/m.  Estimated Nutritional Needs:   Kcal:  1800-2000  Protein:  88-100 gr  Fluid:  >1400 ml daily  Colman Cater MS,RD,CSG,LDN Contact: Shea Evans

## 2022-03-06 NOTE — Progress Notes (Signed)
PROGRESS NOTE   Trevor [name]  PYK:[id number] DOB: 1970/11/28 DOA: 03/01/2022 PCP: [name], [name]   Chief Complaint  Patient presents with   Weakness    N/V fell today and yesterday, states wanted to go to [facility] feeding tube and HX esophageal cancer.   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 He has frequent falls at home worse in last couple of days. He is dizzy all the time.  He is not doing water flushes in PEG and continues to smoke.  He refuses SNF.  His chemotherapy was placed on hold due to severe weight loss.  He does not accept that he is dying. He wants to remain full code.  He demands to be admitted to [facility], adamantly refusing to stay at [facility].  He was noted to have an AKI with severe hypernatremia consistent with worsening volume and nutritional status.  He was agreeable to IV fluids overnight but wants to be discharged home tomorrow.   He again refuses to be admitted to [facility].     Assessment and Plan:  Hypernatremia / Severe Dehydration  - He has severe free water deficiency - Receiving D5W infusion (35 ml/hr) and free water (100 ml q4h) -Sodium normalized after supplementing adequate free water   Hypokalemia Secondary to poor oral intake. Patient states that the Osmolite which was prescribed last month gives him "terrible diarrhea" so he has not been compliant with it. Dietician consult placed and patient will be started on  - Magnesium is normal - replace potassium regularly (he is supposed to be taking daily liquid potassium) - repleted    Stage IV B Metastatic Squamous Cell Cancer - his chemotherapy was placed on temporary hold due to severe weight  loss - he is followed by Dr. Delton Coombes - palliative care met with him during last admission and he remained in denial about the severity of illness and wanted to be full code/full scope care  - follow up with Dr. Delton Coombes for ongoing discussion   Frequent Falls - likely exacerbated by severe debility, malnutrition, weight loss - fall precautions recommended - PT ordered recommending SNF    Failure to thrive  - Pt seems unable to care for self at home - consult to social worker who is looking for SNF placement   Severe protein calorie malnutrition - resume regular diet plus PEG tube feeds  - Phosphorous is 2.0.  - Phos has been repleted now up to 2.5 - monitor for refeeding syndrome, recheck Mg, Phos in AM, replete as needed   Refeeding Syndrome - replacing phosphorus and Mg as needed   Hypophosphatemia - IV replacement ordered    AKI Resolved - prerenal from severe dehydration    Tobacco  - nicotine patch ordered - counseled patient on tobacco cessation    Chronic nausea and vomiting - secondary to esophageal malignancy - treat supportively   History of cavitary pneumonia - completed a 4 week course of augmentin    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  - gabapentin    Leukocytosis  - pt completed 4 week course of augmentin for cavitary pneumonia treatment   DVT prophylaxis: refuses injectables -->rivaroxaban Code Status: full  Family Communication: none avail Disposition: anticipating SNF [facility]) when  bed ready and insurance authorized Remains inpatient appropriate because: IV fluids and electrolytes    Consultants:   Procedures:   Antimicrobials:    Subjective: Pt without complaint today.     Objective: Vitals:   03/05/22 2126 03/06/22 0517 03/06/22 0517 03/06/22 0751  BP: 114/74 103/75    Pulse: (!) 109 (!) 116 (!) 109   Resp: 19 18    Temp: 98.3 F (36.8 C) 98.3 F (36.8 C)    TempSrc: Oral     SpO2: 100% 99% 99%  100%  Weight:      Height:        Intake/Output Summary (Last 24 hours) at 03/06/2022 1516 Last data filed at 03/06/2022 0900 Gross per 24 hour  Intake 0 ml  Output 550 ml  Net -550 ml   Filed Weights   03/01/22 0746 03/04/22 0617  Weight: 42.6 kg 39.2 kg   Examination:  General exam: frail, emaciated appearing male, appears chronically ill, Appears calm and comfortable  Respiratory system: Clear to auscultation. Respiratory effort normal. Cardiovascular system: normal S1 & S2 heard. No JVD, murmurs, rubs, gallops or clicks. No pedal edema. Gastrointestinal system: Abdomen is nondistended, soft and nontender. No organomegaly or masses felt. Normal bowel sounds heard. Central nervous system: Alert and oriented. No focal neurological deficits. Extremities: Symmetric 5 x 5 power. Skin: No rashes, lesions or ulcers. Psychiatry: Judgement and insight appear normal. Mood & affect appropriate.   Data Reviewed: I have personally reviewed following labs and imaging studies  CBC: Recent Labs  Lab 03/01/22 0911 03/02/22 0326 03/03/22 0245  WBC 12.7* 12.6* 13.0*  NEUTROABS 9.3*  --   --   HGB 11.1* 9.0* 8.6*  HCT 35.3* 28.9* 26.9*  MCV 81.3 83.5 81.0  PLT 228 153 131*    Basic Metabolic Panel: Recent Labs  Lab 03/01/22 1118 03/02/22 0326 03/03/22 0245 03/04/22 0507 03/05/22 0503 03/06/22 0456  NA 159* 156* 144 139  --  137  K 3.2* 3.4* 4.1 4.7  --  4.8  CL >130* >130* 123* 115*  --  114*  CO2 19* 21* 19* 19*  --  20*  GLUCOSE 94 125* 102* 86  --  109*  BUN 53* 47* 30* 20  --  13  CREATININE 1.32* 1.22 0.80 0.73  --  0.69  CALCIUM 8.7* 9.1 8.7* 8.3*  --  8.4*  MG 2.0 2.0  --  1.6* 2.8* 2.1  PHOS  --  1.9* 2.0* 3.9 2.5 1.3*    CBG: Recent Labs  Lab 03/05/22 0733 03/05/22 1113 03/05/22 1623  GLUCAP 173* 130* 113*    No results found for this or any previous visit (from the past 240 hour(s)).   Radiology Studies: No results found.  Scheduled Meds:   allopurinol  300 mg Per Tube Daily   Chlorhexidine Gluconate Cloth  6 each Topical Daily   folic acid  1 mg Per Tube Daily   free water  100 mL Per Tube Q4H   gabapentin  300 mg Per Tube QHS   ipratropium-albuterol  3 mL Nebulization BID   megestrol  400 mg Per Tube BID   rivaroxaban  10 mg Per Tube Q supper   saccharomyces boulardii  250 mg Per Tube BID   thiamine  100 mg Per Tube Daily   Continuous Infusions:  dextrose 30 mL/hr at 03/05/22 1152   feeding supplement (VITAL AF 1.2 CAL) 40 mL/hr at 03/05/22 1530   potassium PHOSPHATE IVPB (in mmol) 30 mmol (  03/06/22 1137)    LOS: 4 days   Time spent: 35 mins  Najah Liverman Wynetta Emery, MD How to contact the [facility] Attending or Consulting provider [place] or covering provider during after hours [place], for this patient?  Check the care team in [facility] and look for a) attending/consulting TRH provider listed and b) the [facility] team listed Log into [email or web address] and use [place]'s universal password to access. If you do not have the password, please contact the hospital operator. Locate the [facility] provider you are looking for under [facility] and page to a number that you can be directly reached. If you still have difficulty reaching the provider, please page the [facility] (Director on Call) for the Hospitalists listed on amion for assistance.  03/06/2022, 3:16 PM  ]

## 2022-03-07 DIAGNOSIS — Z720 Tobacco use: Secondary | ICD-10-CM | POA: Diagnosis not present

## 2022-03-07 DIAGNOSIS — N179 Acute kidney failure, unspecified: Secondary | ICD-10-CM | POA: Diagnosis not present

## 2022-03-07 DIAGNOSIS — R627 Adult failure to thrive: Secondary | ICD-10-CM | POA: Diagnosis not present

## 2022-03-07 DIAGNOSIS — E870 Hyperosmolality and hypernatremia: Secondary | ICD-10-CM | POA: Diagnosis not present

## 2022-03-07 LAB — BASIC METABOLIC PANEL
Anion gap: 7 (ref 5–15)
BUN: 19 mg/dL (ref 6–20)
CO2: 20 mmol/L — ABNORMAL LOW (ref 22–32)
Calcium: 8.4 mg/dL — ABNORMAL LOW (ref 8.9–10.3)
Chloride: 109 mmol/L (ref 98–111)
Creatinine, Ser: 0.68 mg/dL (ref 0.61–1.24)
GFR, Estimated: >60 mL/min (ref >60)
Glucose, Bld: 152 mg/dL — ABNORMAL HIGH (ref 70–99)
Potassium: 4.1 mmol/L (ref 3.5–5.1)
Sodium: 136 mmol/L (ref 135–145)

## 2022-03-07 LAB — MAGNESIUM: Magnesium: 2 mg/dL (ref 1.7–2.4)

## 2022-03-07 LAB — PHOSPHORUS: Phosphorus: 2.5 mg/dL (ref 2.5–4.6)

## 2022-03-07 NOTE — Progress Notes (Signed)
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 He has frequent falls at home worse in last couple of days. He is dizzy all the time.  He is not doing water flushes in PEG and continues to smoke.  He refuses SNF.  His chemotherapy was placed on hold due to severe weight loss.  He does not accept that he is dying. He wants to remain full code.  He demands to be admitted to [facility], adamantly refusing to stay at [facility].  He was noted to have an AKI with severe hypernatremia consistent with worsening volume and nutritional status.  He was agreeable to IV fluids overnight but wants to be discharged home tomorrow.   He again refuses to be admitted to [facility].    PT Comments    Patient demonstrates labored movement for sitting up at bedside, fair/good sitting balance seated at EOB and limited to a few steps at bedside before having to sit due to c/o fatigue and generalized weakness.  Patient demonstrates fair/good return for completing BLE ROM/strengthening exercises, but limited for completing seated marching in place due to c/o fatigue.  Patient tolerated sitting up in chair after therapy.  Patient will benefit from continued skilled physical therapy in hospital and recommended venue below to increase strength, balance, endurance for safe ADLs and gait.       Recommendations for follow up therapy are one component of a multi-disciplinary discharge planning process, led by the attending physician.  Recommendations may be updated based on patient status, additional functional criteria and insurance  authorization.  Follow Up Recommendations  Skilled nursing-short term rehab (<3 hours/day) Can patient physically be transported by private vehicle: Yes   Assistance Recommended at Discharge Intermittent Supervision/Assistance  Patient can return home with the following A lot of help with bathing/dressing/bathroom;A lot of help with walking and/or transfers;Help with stairs or ramp for entrance;Assistance with cooking/housework   Equipment Recommendations  Rolling walker (2 wheels)    Recommendations for Other Services       Precautions / Restrictions Precautions Precautions: Fall Restrictions Weight Bearing Restrictions: No     Mobility  Bed Mobility Overal bed mobility: Needs Assistance Bed Mobility: Supine to Sit     Supine to sit: Min guard, HOB elevated     General bed mobility comments: slightly labored movement with HOB raised    Transfers Overall transfer level: Needs assistance Equipment used: Rolling walker (2 wheels) Transfers: Sit to/from Stand, Bed to chair/wheelchair/BSC Sit to Stand: Min guard, Min assist   Step pivot transfers: Min assist       General transfer comment: unsteady labored movement, easily fatigued    Ambulation/Gait Ambulation/Gait assistance: Mod assist Gait Distance (Feet): 6 Feet Assistive device: Rolling walker (2 wheels) Gait Pattern/deviations: Decreased step length - right, Decreased step length - left, Decreased stride length Gait velocity: decreased     General Gait Details: limited to a few slow labored unsteady steps at bedside before having to sit due to c/o fatigue and generalized weakness   Stairs             Wheelchair Mobility    Modified Rankin (Stroke Patients Only)  Balance Overall balance assessment: Needs assistance Sitting-balance support: Feet supported, No upper extremity supported Sitting balance-Leahy Scale: Fair Sitting balance - Comments: fair/good seated at EOB   Standing  balance support: During functional activity, No upper extremity supported Standing balance-Leahy Scale: Fair Standing balance comment: using RW                            Cognition Arousal/Alertness: Awake/alert Behavior During Therapy: WFL for tasks assessed/performed Overall Cognitive Status: Within Functional Limits for tasks assessed                                          Exercises General Exercises - Lower Extremity Long Arc Quad: Seated, AROM, Strengthening, Both, 5 reps Hip Flexion/Marching: Seated, AROM, Strengthening, Both, 10 reps Toe Raises: Seated, AROM, Strengthening, Both, 10 reps Heel Raises: Seated, AROM, Strengthening, Both, 10 reps    General Comments        Pertinent Vitals/Pain Pain Assessment Pain Assessment: No/denies pain    Home Living                          Prior Function            PT Goals (current goals can now be found in the care plan section) Acute Rehab PT Goals Patient Stated Goal: return home after rehab PT Goal Formulation: With patient Time For Goal Achievement: 03/17/22 Potential to Achieve Goals: Good Progress towards PT goals: Progressing toward goals    Frequency    Min 3X/week      PT Plan Current plan remains appropriate    Co-evaluation              AM-PAC PT "6 Clicks" Mobility   Outcome Measure  Help needed turning from your back to your side while in a flat bed without using bedrails?: None Help needed moving from lying on your back to sitting on the side of a flat bed without using bedrails?: A Little Help needed moving to and from a bed to a chair (including a wheelchair)?: A Little Help needed standing up from a chair using your arms (e.g., wheelchair or bedside chair)?: A Little Help needed to walk in hospital room?: A Lot Help needed climbing 3-5 steps with a railing? : A Lot 6 Click Score: 17    End of Session   Activity Tolerance: Patient tolerated  treatment well;Patient limited by fatigue Patient left: in chair;with call bell/phone within reach;with chair alarm set Nurse Communication: Mobility status PT Visit Diagnosis: Other abnormalities of gait and mobility (R26.89);Muscle weakness (generalized) (M62.81);History of falling (Z91.81);Unsteadiness on feet (R26.81)     Time: 1610-9604 PT Time Calculation (min) (ACUTE ONLY): 20 min  Charges:  $Therapeutic Exercise: 8-22 mins $Therapeutic Activity: 8-22 mins                     11:49 AM, 03/07/22 Trevor [name], MPT Physical Therapist with [facility] [phone] office [phone] mobile phone

## 2022-03-07 NOTE — Progress Notes (Signed)
PROGRESS NOTE   Trevor [name]  PYK:[id number] DOB: 1970/11/28 DOA: 03/01/2022 PCP: [name], [name]   Chief Complaint  Patient presents with   Weakness    N/V fell today and yesterday, states wanted to go to [facility] feeding tube and HX esophageal cancer.   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 He has frequent falls at home worse in last couple of days. He is dizzy all the time.  He is not doing water flushes in PEG and continues to smoke.  He refuses SNF.  His chemotherapy was placed on hold due to severe weight loss.  He does not accept that he is dying. He wants to remain full code.  He demands to be admitted to [facility], adamantly refusing to stay at [facility].  He was noted to have an AKI with severe hypernatremia consistent with worsening volume and nutritional status.  He was agreeable to IV fluids overnight but wants to be discharged home tomorrow.   He again refuses to be admitted to [facility].     Assessment and Plan:  Hypernatremia / Severe Dehydration  - He has severe free water deficiency - Receiving D5W infusion (35 ml/hr) and free water (100 ml q4h) -Sodium normalized after supplementing adequate free water   Hypokalemia Secondary to poor oral intake. Patient states that the Osmolite which was prescribed last month gives him "terrible diarrhea" so he has not been compliant with it. Dietician consult placed and patient will be started on  - Magnesium is normal - replace potassium regularly (he is supposed to be taking daily liquid potassium) - repleted    Stage IV B Metastatic Squamous Cell Cancer - his chemotherapy was placed on temporary hold due to severe weight  loss - he is followed by Dr. Delton Coombes - palliative care met with him during last admission and he remained in denial about the severity of illness and wanted to be full code/full scope care  - follow up with Dr. Delton Coombes for ongoing discussion   Frequent Falls - likely exacerbated by severe debility, malnutrition, weight loss - fall precautions recommended - PT ordered recommending SNF    Failure to thrive  - Pt seems unable to care for self at home - consult to social worker who is looking for SNF placement   Severe protein calorie malnutrition - resume regular diet plus PEG tube feeds  - Phosphorous is 2.0.  - Phos has been repleted now up to 2.5 - monitor for refeeding syndrome, recheck Mg, Phos in AM, replete as needed   Refeeding Syndrome - replacing phosphorus and Mg as needed   Hypophosphatemia - IV replacement ordered    AKI Resolved - prerenal from severe dehydration    Tobacco  - nicotine patch ordered - counseled patient on tobacco cessation    Chronic nausea and vomiting - secondary to esophageal malignancy - treat supportively   History of cavitary pneumonia - completed a 4 week course of augmentin    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  - gabapentin    Leukocytosis  - pt completed 4 week course of augmentin for cavitary pneumonia treatment   DVT prophylaxis: refuses injectables -->rivaroxaban Code Status: full  Family Communication: none avail Disposition: anticipating SNF [facility]) when  bed ready and insurance authorized Remains inpatient appropriate because: IV fluids and electrolytes    Consultants:   Procedures:   Antimicrobials:    Subjective: Pt says he has been treated well by staff,  no specific complaints.      Objective: Vitals:   03/07/22 0637 03/07/22 0741 03/07/22 0756 03/07/22 1030  BP:  92/75  (!) 89/61  Pulse:  (!) 113  (!) 126  Resp:  18    Temp:  99 F (37.2 C)  98.8 F (37.1 C)  TempSrc:   Oral  Oral  SpO2:  100% 99% 100%  Weight: 38.2 kg     Height:        Intake/Output Summary (Last 24 hours) at 03/07/2022 1103 Last data filed at 03/07/2022 0900 Gross per 24 hour  Intake 1837.07 ml  Output 650 ml  Net 1187.07 ml   Filed Weights   03/01/22 0746 03/04/22 0617 03/07/22 0637  Weight: 42.6 kg 39.2 kg 38.2 kg   Examination:  General exam: frail, emaciated appearing male, appears chronically ill, Appears calm and comfortable  Respiratory system: Clear to auscultation. Respiratory effort normal. Cardiovascular system: normal S1 & S2 heard. No JVD, murmurs, rubs, gallops or clicks. No pedal edema. Gastrointestinal system: Abdomen is nondistended, soft and nontender. No organomegaly or masses felt. Normal bowel sounds heard. Central nervous system: Alert and oriented. No focal neurological deficits. Extremities: Symmetric 5 x 5 power. Skin: No rashes, lesions or ulcers. Psychiatry: Judgement and insight appear normal. Mood & affect appropriate.   Data Reviewed: I have personally reviewed following labs and imaging studies  CBC: Recent Labs  Lab 03/01/22 0911 03/02/22 0326 03/03/22 0245  WBC 12.7* 12.6* 13.0*  NEUTROABS 9.3*  --   --   HGB 11.1* 9.0* 8.6*  HCT 35.3* 28.9* 26.9*  MCV 81.3 83.5 81.0  PLT 228 153 131*    Basic Metabolic Panel: Recent Labs  Lab 03/02/22 0326 03/03/22 0245 03/04/22 0507 03/05/22 0503 03/06/22 0456 03/07/22 0437  NA 156* 144 139  --  137 136  K 3.4* 4.1 4.7  --  4.8 4.1  CL >130* 123* 115*  --  114* 109  CO2 21* 19* 19*  --  20* 20*  GLUCOSE 125* 102* 86  --  109* 152*  BUN 47* 30* 20  --  13 19  CREATININE 1.22 0.80 0.73  --  0.69 0.68  CALCIUM 9.1 8.7* 8.3*  --  8.4* 8.4*  MG 2.0  --  1.6* 2.8* 2.1 2.0  PHOS 1.9* 2.0* 3.9 2.5 1.3* 2.5    CBG: Recent Labs  Lab 03/05/22 0733 03/05/22 1113 03/05/22 1623  GLUCAP 173* 130* 113*    No results found for this or any previous visit (from the past 240 hour(s)).    Radiology Studies: No results found.  Scheduled Meds:  allopurinol  300 mg Per Tube Daily   Chlorhexidine Gluconate Cloth  6 each Topical Daily   folic acid  1 mg Per Tube Daily   free water  100 mL Per Tube Q4H   gabapentin  300 mg Per Tube QHS   ipratropium-albuterol  3 mL Nebulization BID   megestrol  400 mg Per Tube BID   rivaroxaban  10 mg Per Tube Q supper   saccharomyces boulardii  250 mg Per Tube BID   thiamine  100 mg Per Tube Daily   Continuous Infusions:  dextrose 30 mL/hr at 03/05/22 1152   feeding supplement (VITAL AF 1.2 CAL) 1,000  mL (03/07/22 0225)    LOS: 5 days   Time spent: 23 mins  Conna Terada Wynetta Emery, MD How to contact the [facility] Attending or Consulting provider [place] or covering provider during after hours [place], for this patient?  Check the care team in [facility] and look for a) attending/consulting TRH provider listed and b) the [facility] team listed Log into [email or web address] and use [place]'s universal password to access. If you do not have the password, please contact the hospital operator. Locate the [facility] provider you are looking for under [facility] and page to a number that you can be directly reached. If you still have difficulty reaching the provider, please page the [facility] (Director on Call) for the Hospitalists listed on amion for assistance.  03/07/2022, 11:03 AM  ]

## 2022-03-07 NOTE — TOC Progression Note (Signed)
Transition of Care [facility]) - Progression Note    Patient Details  Name: Trevor [name] MRN: [id number] Date of Birth: [birth year]  Transition of Care [facility] CM/SW Contact  Salome Arnt, [place] Phone Number: 03/07/2022, 10:13 AM  Clinical Narrative: Per Jackelyn Poling at [facility], insurance is requesting updated PT note. PT notified. TOC will continue to follow.       Expected Discharge Plan: [place] Barriers to Discharge: Continued Medical Work up  Expected Discharge Plan and Services Expected Discharge Plan: [place] In-house Referral: Clinical Social Work Discharge Planning Services: CM Consult Post Acute Care Choice: [place] Living arrangements for the past 2 months: Apartment Expected Discharge Date: 03/03/22                                     Social Determinants of Health (SDOH) Interventions Housing Interventions: Intervention Not Indicated  Readmission Risk Interventions    03/03/2022   11:46 AM 01/24/2022    3:13 PM  Readmission Risk Prevention Plan  Transportation Screening Complete Complete  PCP or Specialist Appt within 3-5 Days  Complete  HRI or [place]  Complete  Social Work Consult for [place] Planning/Counseling  Complete  Palliative Care Screening  Complete  Medication Review Press photographer) Complete Complete  HRI or [place] Complete   SW Recovery Care/Counseling Consult Complete   Palliative Care Screening Complete   [place] Patient Refused

## 2022-03-07 NOTE — Progress Notes (Signed)
The patient has rested most of the night. The patient is a yellow mews at this time due to his heart rate running between 107-114 during this shift. Provider notified via secure chat. Patient does not appear to be in any distress.

## 2022-03-08 ENCOUNTER — Inpatient Hospital Stay: Payer: 59 | Admitting: Hematology

## 2022-03-08 DIAGNOSIS — Z931 Gastrostomy status: Secondary | ICD-10-CM | POA: Diagnosis not present

## 2022-03-08 DIAGNOSIS — C159 Malignant neoplasm of esophagus, unspecified: Secondary | ICD-10-CM

## 2022-03-08 DIAGNOSIS — K9423 Gastrostomy malfunction: Secondary | ICD-10-CM

## 2022-03-08 DIAGNOSIS — E870 Hyperosmolality and hypernatremia: Secondary | ICD-10-CM | POA: Diagnosis not present

## 2022-03-08 DIAGNOSIS — N179 Acute kidney failure, unspecified: Secondary | ICD-10-CM | POA: Diagnosis not present

## 2022-03-08 LAB — ACID FAST CULTURE WITH REFLEXED SENSITIVITIES (MYCOBACTERIA)
Acid Fast Culture: NEGATIVE
Acid Fast Culture: NEGATIVE
Acid Fast Culture: NEGATIVE

## 2022-03-08 MED ORDER — MIDODRINE HCL 5 MG PO TABS
5.0000 mg | ORAL_TABLET | Freq: Three times a day (TID) | ORAL | Status: DC
  Filled 2022-03-08: qty 1

## 2022-03-08 MED ORDER — LACTATED RINGERS IV BOLUS
500.0000 mL | Freq: Once | INTRAVENOUS | Status: AC
  Administered 2022-03-08: 500 mL via INTRAVENOUS

## 2022-03-08 MED ORDER — MIDODRINE HCL 5 MG PO TABS
5.0000 mg | ORAL_TABLET | Freq: Three times a day (TID) | ORAL | Status: DC
  Administered 2022-03-08 – 2022-03-23 (×40): 5 mg
  Filled 2022-03-08 (×40): qty 1

## 2022-03-08 MED ORDER — LACTATED RINGERS IV BOLUS
1000.0000 mL | Freq: Once | INTRAVENOUS | Status: AC
  Administered 2022-03-08: 1000 mL via INTRAVENOUS

## 2022-03-08 NOTE — TOC Progression Note (Signed)
Transition of Care [facility]) - Progression Note    Patient Details  Name: Trevor [name] MRN: [id number] Date of Birth: 07/17/1970  [facility] CM/SW Contact  Boneta Lucks, RN Phone Number: 03/08/2022, 1:49 PM  Clinical Narrative:   Per Jackelyn Poling at [facility], insurance auth received. Patient needs a new Gtube, we will plan for DC tomorrow.   Expected Discharge Plan: [place] Barriers to Discharge: Insurance Authorization  Expected Discharge Plan and Services Expected Discharge Plan: [place] In-house Referral: Clinical Social Work Discharge Planning Services: CM Consult Post Acute Care Choice: [place] Living arrangements for the past 2 months: Apartment Expected Discharge Date: 03/03/22                   Social Determinants of Health (SDOH) Interventions Housing Interventions: Intervention Not Indicated  Readmission Risk Interventions    03/03/2022   11:46 AM 01/24/2022    3:13 PM  Readmission Risk Prevention Plan  Transportation Screening Complete Complete  PCP or Specialist Appt within 3-5 Days  Complete  HRI or [place]  Complete  Social Work Consult for [place] Planning/Counseling  Complete  Palliative Care Screening  Complete  Medication Review Press photographer) Complete Complete  HRI or [place] Complete   SW Recovery Care/Counseling Consult Complete   Palliative Care Screening Complete   [place] Patient Refused

## 2022-03-08 NOTE — Progress Notes (Signed)
   03/08/22 1509  Assess: MEWS Score  Temp 98.6 F (37 C)  BP 101/69  MAP (mmHg) 79  Pulse Rate (!) 103  Resp 18  SpO2 100 %  O2 Device Nasal Cannula  O2 Flow Rate (L/min) 2 L/min  Assess: MEWS Score  MEWS Temp 0  MEWS Systolic 0  MEWS Pulse 1  MEWS RR 0  MEWS LOC 0  MEWS Score 1  MEWS Score Color Green  Notify: Charge Nurse/RN  Name of Charge Nurse/RN Notified Mary RN  Date Charge Nurse/RN Notified 03/08/22  Time Charge Nurse/RN Notified 1519  Provider Notification  Provider Name/Title Orson Eva MD  Date Provider Notified 03/08/22  Time Provider Notified 7622  Assess: SIRS CRITERIA  SIRS Temperature  0  SIRS Pulse 1  SIRS Respirations  0  SIRS WBC 1  SIRS Score Sum  2

## 2022-03-08 NOTE — Progress Notes (Addendum)
Nutrition Follow up  DOCUMENTATION CODES:   Severe malnutrition in context of chronic illness  INTERVENTION:  Vital 1.2 @ 60 ml/hr via PEG (held due to malfunction)  Regular diet   NUTRITION DIAGNOSIS:   Severe Malnutrition related to chronic illness (esophageal cancer- Stage IV) as evidenced by severe muscle depletion, severe fat depletion.  GOAL:  Patient will meet greater than or equal to 90% of their needs -ongoing  MONITOR:  PO intake, Labs, Weight trends, TF tolerance, stool frequency   ASSESSMENT: [name], PEG tube, severe malnutrition, and dehydrated at admission.   [name] No other food items consumed. Poor dentition noted.   [name] Change formula to elemental profile and if diarrhea persists will add Banatrol TID via PEG. Discussed with clinical nutrition team leaders  as well as attending team. Advance TF gradually due to refeeding risk.  Medications: thiamine, folic acid, megace, klor-con, florastor.   [name] Unplanned loss of 10% < 3 months which is significant.   [name] Magnesium, potassium and phosphorus being replaced as needed. Regular diet provided with minimal intake per nursing. Able to feed himself. His breakfast tray is here but untouched (2 egg sandwiches, milk, coffee). Talked with nursing.  Continue advancing tube feeding as noted above.    [name] He has been tolerating goal rate. Surgery consulted.  [name] Nursing documented meal intake 50% of breakfast and 25% lunch. Possible discharge once PEG is changed. Disposition SNF.  Tube feeding regimen noted above  provides 1728 kcal, 108 grams of protein, and 1168 ml of H2O.     Latest Ref Rng & Units 03/07/2022    4:37 AM 03/06/2022    4:56 AM 03/04/2022    5:07 AM  BMP  Glucose 70 - 99 mg/dL 152  109  86   BUN 6 - 20 mg/dL '19  13  20   '$ Creatinine 0.61 - 1.24 mg/dL 0.68  0.69  0.73   Sodium 135 - 145 mmol/L 136  137  139   Potassium 3.5 - 5.1 mmol/L 4.1  4.8  4.7   Chloride 98 - 111 mmol/L 109  114  115   CO2 22 - 32 mmol/L '20  20  19   '$ Calcium 8.9 - 10.3 mg/dL 8.4  8.4  8.3       NUTRITION - FOCUSED PHYSICAL EXAM:  Flowsheet Row Most Recent Value  Orbital Region Severe depletion  Upper Arm Region Severe depletion  Thoracic and Lumbar Region Severe depletion  Buccal Region Severe depletion  Temple Region Severe depletion  Clavicle Bone Region Severe depletion  Clavicle and Acromion Bone Region Severe depletion  Dorsal Hand Moderate depletion  Patellar Region Severe depletion  Anterior Thigh Region Severe depletion  Posterior Calf Region Severe depletion  Edema (RD Assessment) Mild  Hair Reviewed  Eyes Reviewed  Mouth Reviewed  Skin Reviewed  Nails Reviewed      Diet Order:   Diet Order             Diet regular Room service appropriate? Yes; Fluid consistency: Thin  Diet effective now  EDUCATION NEEDS:  Education needs have been addressed  Skin:  Skin Assessment: Reviewed RN Assessment (PEG tube- 20 french LUQ)  Last BM:  11/11  Height:   Ht Readings from Last 1 Encounters:  03/01/22 '5\' 6"'$  (1.676 m)    Weight:   Wt Readings from Last 1 Encounters:  03/08/22 39.9 kg    Ideal Body Weight:   65 kg  BMI:  Body mass index is 14.2 kg/m.  Estimated Nutritional Needs:   Kcal:  1800-2000  Protein:  88-100 gr  Fluid:  >1400 ml daily  Colman Cater MS,RD,CSG,LDN Contact: Shea Evans

## 2022-03-08 NOTE — Consult Note (Signed)
Procedure note: Due to where and tear on the patient's previously placed mushroom gastrostomy tube, I replaced the tube at bedside with a 20 French right angle gastrostomy tube.  I did aspirate gastric contents.  The bolster was placed at the 2 cm Santita Hunsberger.  Patient tolerated the procedure well.  The gastrostomy tube may be used.

## 2022-03-08 NOTE — Progress Notes (Signed)
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 He has frequent falls at home worse in last couple of days. He is dizzy all the time.  He is not doing water flushes in PEG and continues to smoke.  He refuses SNF.  His chemotherapy was placed on hold due to severe weight loss.  He does not accept that he is dying. He wants to remain full code.  He demands to be admitted to [facility], adamantly refusing to stay at [facility].  He was noted to have an AKI with severe hypernatremia consistent with worsening volume and nutritional status.  He was agreeable to IV fluids overnight but wants to be discharged home tomorrow.   He again refuses to be admitted to [name].     Assessment/Plan:   Hypernatremia / Severe Dehydration  - He has severe free water deficiency - Receiving D5W infusion (35 ml/hr) and free water (100 ml q4h) -Sodium normalized after supplementing adequate free water and IVF  Hypotension -pt has had soft BPs for most of his hospitalization -check lactic -check PCT -rebolus LR -start midodrine -am cortisol -11/15 1345--97.4--110--16--87/62--99% on 2L  G-tube Malfunction -G tube has a hole>>leaking -reconsult general surgery   Hypokalemia Secondary to poor oral intake. Patient states that the Osmolite which was prescribed last month gives him "terrible diarrhea" so he has not been compliant with it. Dietician consult placed and patient will be started on  - Magnesium is normal - replace potassium regularly (he is supposed to be taking daily liquid potassium) - repleted    Stage IV B Metastatic Squamous  Cell Cancer - his chemotherapy was placed on temporary hold due to severe weight loss - he is followed by Dr. Delton Coombes - palliative care met with him during last admission and he remained in denial about the severity of illness and wanted to be full code/full scope care  - follow up with Dr. Delton Coombes for ongoing discussion   Frequent Falls - likely exacerbated by severe debility, malnutrition, weight loss - fall precautions recommended - PT ordered recommending SNF    Failure to thrive  - Pt seems unable to care for self at home - consult to social worker who is looking for SNF placement   Severe protein calorie malnutrition - resume regular diet plus PEG tube feeds  - Phosphorous is 2.0.  - Phos has been repleted now up to 2.5 - monitor for refeeding syndrome, recheck Mg, Phos in AM, replete as needed    Refeeding Syndrome - replacing phosphorus and Mg as needed    Hypophosphatemia - IV replacement ordered    AKI Resolved - prerenal from severe dehydration  -serum creatinine peaked 1.32   Tobacco  - nicotine patch ordered - counseled patient on tobacco cessation    Chronic nausea and vomiting - secondary to esophageal malignancy - treat supportively - pt insists upon po intake   History of cavitary pneumonia - completed a 4 week course of augmentin    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  - gabapentin    Leukocytosis  - pt completed 4 week course of augmentin for cavitary pneumonia treatment   DVT  prophylaxis: refuses injectables -->rivaroxaban Code Status: full  Family Communication: none avail Disposition: anticipating SNF [facility]) when bed ready and insurance authorized Remains inpatient appropriate because: IV fluids and electrolytes            Subjective: He continues to have spit up about 3-4 times a day.  He denies any hematemesis.  He denies any chest pain, diarrhea, abdominal pain.  He has some occasional shortness  of breath with exertion.  Objective: Vitals:   03/08/22 0030 03/08/22 0516 03/08/22 0602 03/08/22 0706  BP: (!) 82/66 (!) 86/67    Pulse: (!) 103 (!) 108    Resp: 18 18    Temp: 98 F (36.7 C) 97.6 F (36.4 C)    TempSrc:      SpO2: 100% 100%  98%  Weight:   39.9 kg   Height:        Intake/Output Summary (Last 24 hours) at 03/08/2022 1346 Last data filed at 03/08/2022 0500 Gross per 24 hour  Intake 1300 ml  Output 200 ml  Net 1100 ml   Weight change: 1.7 kg Exam:  General:  Pt is alert, follows commands appropriately, not in acute distress HEENT: No icterus, No thrush, No neck mass, [place]/AT Cardiovascular: RRR, S1/S2, no rubs, no gallops Respiratory: Bilateral rales.  No wheezing. Abdomen: Soft/+BS, non tender, non distended, no guarding Extremities: No edema, No lymphangitis, No petechiae, No rashes, no synovitis   Data Reviewed: I have personally reviewed following labs and imaging studies Basic Metabolic Panel: Recent Labs  Lab 03/02/22 0326 03/03/22 0245 03/04/22 0507 03/05/22 0503 03/06/22 0456 03/07/22 0437  NA 156* 144 139  --  137 136  K 3.4* 4.1 4.7  --  4.8 4.1  CL >130* 123* 115*  --  114* 109  CO2 21* 19* 19*  --  20* 20*  GLUCOSE 125* 102* 86  --  109* 152*  BUN 47* 30* 20  --  13 19  CREATININE 1.22 0.80 0.73  --  0.69 0.68  CALCIUM 9.1 8.7* 8.3*  --  8.4* 8.4*  MG 2.0  --  1.6* 2.8* 2.1 2.0  PHOS 1.9* 2.0* 3.9 2.5 1.3* 2.5   Liver Function Tests: No results for input(s): "AST", "ALT", "ALKPHOS", "BILITOT", "PROT", "ALBUMIN" in the last 168 hours. No results for input(s): "LIPASE", "AMYLASE" in the last 168 hours. No results for input(s): "AMMONIA" in the last 168 hours. Coagulation Profile: No results for input(s): "INR", "PROTIME" in the last 168 hours. CBC: Recent Labs  Lab 03/02/22 0326 03/03/22 0245  WBC 12.6* 13.0*  HGB 9.0* 8.6*  HCT 28.9* 26.9*  MCV 83.5 81.0  PLT 153 131*   Cardiac Enzymes: No results for input(s):  "CKTOTAL", "CKMB", "CKMBINDEX", "TROPONINI" in the last 168 hours. BNP: Invalid input(s): "POCBNP" CBG: Recent Labs  Lab 03/05/22 0733 03/05/22 1113 03/05/22 1623  GLUCAP 173* 130* 113*   HbA1C: No results for input(s): "HGBA1C" in the last 72 hours. Urine analysis:    Component Value Date/Time   COLORURINE AMBER (A) 02/16/2022 0500   APPEARANCEUR CLOUDY (A) 02/16/2022 0500   LABSPEC >1.046 (H) 02/16/2022 0500   PHURINE 5.0 02/16/2022 0500   GLUCOSEU NEGATIVE 02/16/2022 0500   HGBUR NEGATIVE 02/16/2022 0500   BILIRUBINUR SMALL (A) 02/16/2022 0500   KETONESUR NEGATIVE 02/16/2022 0500   PROTEINUR 30 (A) 02/16/2022 0500   NITRITE NEGATIVE 02/16/2022 0500   LEUKOCYTESUR NEGATIVE 02/16/2022 0500   Sepsis Labs: _0 (procalcitonin:4,lacticidven:4) )No results found for this or any  previous visit (from the past 240 hour(s)).   Scheduled Meds:  allopurinol  300 mg Per Tube Daily   Chlorhexidine Gluconate Cloth  6 each Topical Daily   folic acid  1 mg Per Tube Daily   free water  100 mL Per Tube Q4H   gabapentin  300 mg Per Tube QHS   ipratropium-albuterol  3 mL Nebulization BID   megestrol  400 mg Per Tube BID   midodrine  5 mg Oral TID WC   rivaroxaban  10 mg Per Tube Q supper   saccharomyces boulardii  250 mg Per Tube BID   thiamine  100 mg Per Tube Daily   Continuous Infusions:  feeding supplement (VITAL AF 1.2 CAL) 1,000 mL (03/07/22 0225)   lactated ringers      Procedures/Studies: DG Chest Portable 1 View  Result Date: 03/02/2022 CLINICAL DATA:  Issues with port EXAM: PORTABLE CHEST 1 VIEW COMPARISON:  02/14/2022, CT 02/14/2022 FINDINGS: Left-sided central venous port tip over the SVC. Tubing appears grossly continuous. No pleural effusion or pneumothorax. Stable cardiomediastinal silhouette. Probable skin fold artifact over the left chest. Multiple old bilateral rib fractures. Mild nodularity in the right upper lobe, likely corresponds to tree-in-bud density  on the prior CT. IMPRESSION: 1. Left-sided central venous port tip over the SVC. Port tubing appears grossly continuous. 2. Mild nodularity in the right upper lobe adjacent to the fissure, likely corresponds to tree-in-bud density on prior CT. Electronically Signed   By: Donavan Foil M.D.   On: 03/02/2022 23:43   CT HEAD WO CONTRAST (5MM)  Result Date: 03/02/2022 CLINICAL DATA:  Head trauma. EXAM: CT HEAD WITHOUT CONTRAST TECHNIQUE: Contiguous axial images were obtained from the base of the skull through the vertex without intravenous contrast. RADIATION DOSE REDUCTION: This exam was performed according to the departmental dose-optimization program which includes automated exposure control, adjustment of the mA and/or kV according to patient size and/or use of iterative reconstruction technique. COMPARISON:  CT Head 02/28/22 FINDINGS: Brain: No evidence of acute infarction, hemorrhage, hydrocephalus, extra-axial collection or mass lesion/mass effect. Sequela of mild chronic microvascular ischemic change. Vascular: No hyperdense vessel or unexpected calcification. Skull: Normal. Negative for fracture or focal lesion. Sinuses/Orbits: No acute finding. Other: None IMPRESSION: No acute intracranial abnormality. Electronically Signed   By: Marin Roberts M.D.   On: 03/02/2022 19:51   DG Knee 2 Views Left  Result Date: 03/01/2022 CLINICAL DATA:  Trauma EXAM: LEFT KNEE - 1-2 VIEW COMPARISON:  None Available. FINDINGS: Trace knee joint effusion. No fracture. No dislocation. Mild enthesopathic changes of the patella. Sclerotic lesion in the fibula has well-circumscribed margins and is favored to be benign in the absence of other known osseous metastases. No discernible soft tissue abnormality. IMPRESSION: Trace knee joint effusion. No fracture or dislocation. Electronically Signed   By: Marin Roberts M.D.   On: 03/01/2022 08:21   DG Knee 2 Views Right  Result Date: 03/01/2022 CLINICAL DATA:  Fall with patellar  pain. EXAM: RIGHT KNEE - 1-2 VIEW COMPARISON:  None Available. FINDINGS: No evidence of regional fracture. No weight-bearing compartment degenerative disease. 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 No other finding of note. IMPRESSION: No acute or traumatic finding. 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 Electronically Signed   By: Nelson Chimes M.D.   On: 03/01/2022 08:18   CT Head Wo Contrast  Result Date: 02/28/2022 CLINICAL DATA:  Head trauma, moderate to severe. [place] getting out of bed with laceration to left eyebrow. EXAM: CT  HEAD WITHOUT CONTRAST TECHNIQUE: Contiguous axial images were obtained from the base of the skull through the vertex without intravenous contrast. RADIATION DOSE REDUCTION: This exam was performed according to the departmental dose-optimization program which includes automated exposure control, adjustment of the mA and/or kV according to patient size and/or use of iterative reconstruction technique. COMPARISON:  None. FINDINGS: Brain: No acute intracranial hemorrhage, midline shift or mass effect. No extra-axial fluid collection. Diffuse atrophy is noted. Periventricular white matter hypodensities are noted bilaterally. No hydrocephalus. Vascular: There is atherosclerotic calcification of the carotid siphons. No hyperdense vessel. Skull: Normal. Negative for fracture or focal lesion. Sinuses/Orbits: No acute finding. Other: None. IMPRESSION: 1. No acute intracranial process. 2. Atrophy with chronic microvascular ischemic changes. Electronically Signed   By: Brett Fairy M.D.   On: 02/28/2022 03:40   CT Angio Chest PE W and/or Wo Contrast  Result Date: 02/14/2022 CLINICAL DATA:  Intermittent chest pain x3 days. EXAM: CT ANGIOGRAPHY CHEST WITH CONTRAST TECHNIQUE: Multidetector CT imaging of the chest was performed using the standard protocol during bolus administration of intravenous contrast. Multiplanar CT image reconstructions and MIPs were obtained  to evaluate the vascular anatomy. RADIATION DOSE REDUCTION: This exam was performed according to the departmental dose-optimization program which includes automated exposure control, adjustment of the mA and/or kV according to patient size and/or use of iterative reconstruction technique. CONTRAST:  71m OMNIPAQUE IOHEXOL 350 MG/ML SOLN COMPARISON:  January 31, 2022 FINDINGS: Cardiovascular: A left-sided venous Port-A-Cath is noted. There is mild calcification of the aortic arch. The thoracic aorta is otherwise normal in appearance. Satisfactory opacification of the pulmonary arteries to the segmental level. No evidence of pulmonary embolism. Normal heart size with mild coronary artery calcification. No pericardial effusion. Mediastinum/Nodes: No enlarged mediastinal, hilar, or axillary lymph nodes. A dilated and debris filled proximal and mid esophagus is again seen. The distal esophageal mass noted on the prior exam is poorly visualized on the current study. The thyroid gland and trachea demonstrate no significant findings. Lungs/Pleura: Mild lingular, right upper lobe, right middle lobe and right lower lobe tree-in-bud infiltrates are seen. Mild posterior left lower lobe tree-in-bud infiltrate is also noted. These areas are decreased in severity when compared to the prior study. A 17 mm x 11 mm area of prominent bronchiectasis is noted within the posteromedial aspect of the right lower lobe, along an area of central peribronchovascular consolidation seen on the prior exam (axial CT images 58 through 64, CT series 7). A 6 mm area of linear scarring is seen within the posterior aspect of the right lower lobe (axial CT image 53, CT series 7). An 11 mm thin walled cavitary lesion is noted within this region on the prior study. There is no evidence of a pleural effusion or pneumothorax. Upper Abdomen: A percutaneous gastrostomy tube is in place. Musculoskeletal: Numerous chronic bilateral rib fractures are again seen.  Stable chronic compression fracture deformities are seen within the mid and upper thoracic spine. Review of the MIP images confirms the above findings. IMPRESSION: 1. No evidence of pulmonary embolism. 2. Mild bilateral tree-in-bud infiltrates, decreased in severity when compared to the prior study. 3. Dilated and debris filled proximal and mid esophagus without clear visualization of the distal esophageal mass noted on the prior exam. 4. Numerous chronic bilateral rib fractures and chronic compression fracture deformities within the mid and upper thoracic spine. 5. Percutaneous gastrostomy tube. 6. Aortic atherosclerosis. Aortic Atherosclerosis (ICD10-I70.0). Electronically Signed   By: TVirgina NorfolkM.D.   On:  02/14/2022 19:11   DG Chest 2 View  Result Date: 02/14/2022 CLINICAL DATA:  Chest pain EXAM: CHEST - 2 VIEW COMPARISON:  CT 01/31/2022 FINDINGS: Left chest wall port a catheter is noted with tip in the distal SVC. Heart size appears normal. No pleural effusion or edema. No airspace opacities identified. Unchanged remote healed rib fractures. Remote inferior endplate deformity involving the T7 vertebra appears stable. IMPRESSION: No acute cardiopulmonary abnormalities. Electronically Signed   By: Kerby Moors M.D.   On: 02/14/2022 12:16    Orson Eva, DO  [facility]  If 7PM-7AM, please contact night-coverage [email or web address] Password [email or web address] 03/08/2022, 1:46 PM   LOS: 6 days

## 2022-03-08 NOTE — Progress Notes (Signed)
Pt has rested well.  Denies pain.  Tolerating tube feeds. No emesis. Port needle/dressing changed this shift.

## 2022-03-09 ENCOUNTER — Ambulatory Visit: Payer: Self-pay | Admitting: *Deleted

## 2022-03-09 DIAGNOSIS — E43 Unspecified severe protein-calorie malnutrition: Secondary | ICD-10-CM

## 2022-03-09 DIAGNOSIS — K9423 Gastrostomy malfunction: Secondary | ICD-10-CM | POA: Diagnosis not present

## 2022-03-09 DIAGNOSIS — N179 Acute kidney failure, unspecified: Secondary | ICD-10-CM | POA: Diagnosis not present

## 2022-03-09 DIAGNOSIS — E870 Hyperosmolality and hypernatremia: Secondary | ICD-10-CM | POA: Diagnosis not present

## 2022-03-09 DIAGNOSIS — C159 Malignant neoplasm of esophagus, unspecified: Secondary | ICD-10-CM | POA: Diagnosis not present

## 2022-03-09 LAB — CBC
HCT: 23.8 % — ABNORMAL LOW (ref 39.0–52.0)
Hemoglobin: 7.6 g/dL — ABNORMAL LOW (ref 13.0–17.0)
MCH: 25.9 pg — ABNORMAL LOW (ref 26.0–34.0)
MCHC: 31.9 g/dL (ref 30.0–36.0)
MCV: 81.2 fL (ref 80.0–100.0)
Platelets: 257 10*3/uL (ref 150–400)
RBC: 2.93 MIL/uL — ABNORMAL LOW (ref 4.22–5.81)
RDW: 20.3 % — ABNORMAL HIGH (ref 11.5–15.5)
WBC: 5.7 10*3/uL (ref 4.0–10.5)
nRBC: 0 % (ref 0.0–0.2)

## 2022-03-09 LAB — MAGNESIUM: Magnesium: 1.8 mg/dL (ref 1.7–2.4)

## 2022-03-09 LAB — CORTISOL-AM, BLOOD: Cortisol - AM: 4 ug/dL — ABNORMAL LOW (ref 6.7–22.6)

## 2022-03-09 LAB — BASIC METABOLIC PANEL
Anion gap: 3 — ABNORMAL LOW (ref 5–15)
BUN: 23 mg/dL — ABNORMAL HIGH (ref 6–20)
CO2: 22 mmol/L (ref 22–32)
Calcium: 7.8 mg/dL — ABNORMAL LOW (ref 8.9–10.3)
Chloride: 110 mmol/L (ref 98–111)
Creatinine, Ser: 0.64 mg/dL (ref 0.61–1.24)
GFR, Estimated: >60 mL/min (ref >60)
Glucose, Bld: 87 mg/dL (ref 70–99)
Potassium: 3.9 mmol/L (ref 3.5–5.1)
Sodium: 135 mmol/L (ref 135–145)

## 2022-03-09 LAB — PROCALCITONIN: Procalcitonin: 0.13 ng/mL

## 2022-03-09 LAB — PHOSPHORUS: Phosphorus: 1.4 mg/dL — ABNORMAL LOW (ref 2.5–4.6)

## 2022-03-09 LAB — LACTIC ACID, PLASMA: Lactic Acid, Venous: 1.2 mmol/L (ref 0.5–1.9)

## 2022-03-09 MED ORDER — LACTATED RINGERS IV BOLUS
500.0000 mL | Freq: Once | INTRAVENOUS | Status: AC
  Administered 2022-03-09: 500 mL via INTRAVENOUS

## 2022-03-09 MED ORDER — LACTATED RINGERS IV BOLUS
1000.0000 mL | Freq: Once | INTRAVENOUS | Status: AC
  Administered 2022-03-09: 1000 mL via INTRAVENOUS

## 2022-03-09 NOTE — Progress Notes (Signed)
Entered pt room at 0450 today to do labs/vitals.  Pt states, "you can't use that.  That thing is leaking.  Got my bed wet (pt referring to [facility])." Advised pt port is not attached, not leaking.  Observed g tube, noted that tube laying on floor of room and small amount (approx 25 ml) tube feed on floor.  Pt tshirt has small amount on shirt.  Asked pt when he noted tube out and pt stated "Awhile ago".  Pt assisted to chair, linen, gown changed.  Notified MD of this and of pt low BP.  Order for 500 ml fluid bolus received.  Surgery to follow up today on tube feed.  Tube feed site cleansed and dry dressing applied.

## 2022-03-09 NOTE — Progress Notes (Signed)
Patient ID: Trevor [name], male   DOB: [birth year], 51 y.o.   MRN: [id number]    Request received for G tube replacement in IR at [facility] per Dr Mickeal Needy Pt with esophageal cancer Protein calorie malnutrition; failure to thrive; dysphagia  Open G tube placed in OR with Dr Arnoldo Morale 10/12/21 Pulled out last pm Dr Arnoldo Morale did try to replace at bedside - unsuccessful-- meatus closed  Asking IR to replace I have reviewed imaging and case with Dr Laurence Ferrari He approves procedure   Pt to be at [facility] by 830 am 11/17 via ambulance Will return to [facility] after procedure

## 2022-03-09 NOTE — Consult Note (Signed)
   [facility] Inpatient Consult   03/09/2022  Trevor [name] 1971/01/04 [id number]  [place] [facility] [ACO] Patient: [facility] Liaison remote review coverage for patient at [facility]  Primary Care Provider:  [name], [place] with [facility]    Patient is currently active with [facility] [facility] with [place] for care coordination services.  Patient has been engaged by a [place] however, patient has been hospitalized with make it difficult to connecting in the community.  Our community based plan of care has focused on disease management and community resource support.   Chart reviewed for extreme high risk for unplanned readmission less than 7 days with noted extreme high risk score. Ongoing barriers noted for patient with high risk diagnosis with CA with mets and refusal for higher level of care as recommended for post hospital needs noted per MD progress notes. Review of inpatient [facility] RNCM notes possible transition to [facility] with insurance approval.  Plan: Will reach out to [place] for post hospital community needs if this remains disposition.  *No Inpatient Transition Of Care [TOC] team member listed on Care Teams at time of this review to make aware that [place] [facility] following.   Will continue to follow  Of note, [facility] Care Management services does not replace or interfere with any services that are needed or arranged by inpatient [facility] care management team.   For additional questions or referrals please contact:  Natividad Brood, RN BSN [place]  [phone] business mobile phone Toll free office [phone]  *[place]  [phone] Fax number: [phone] [email or web address]

## 2022-03-09 NOTE — Progress Notes (Signed)
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 He has frequent falls at home worse in last couple of days. He is dizzy all the time.  He is not doing water flushes in PEG and continues to smoke.  He refuses SNF.  His chemotherapy was placed on hold due to severe weight loss.  He does not accept that he is dying. He wants to remain full code.  He demands to be admitted to [facility], adamantly refusing to stay at [facility].  He was noted to have an AKI with severe hypernatremia consistent with worsening volume and nutritional status.  He was agreeable to IV fluids overnight but wants to be discharged home tomorrow.   He again refuses to be admitted to [facility].   As the hospitalization progress,  he ultimately agreed to stay at [facility] after he came to the realization that he would not receive any different care at [facility].  He was started on hypotonic fluid  and free water flushes with improvement in his hypernatremia.     Assessment/Plan:  Hypernatremia / Severe Dehydration  - He has severe free water deficiency - Receiving D5W infusion (35 ml/hr) and free water (100 ml q4h) -Sodium normalized after supplementing adequate free water and IVF   Hypotension -pt has had soft BPs for most of his hospitalization -check lactic 1.2 -check PCT 0.13 -rebolus LR -started midodrine -am cortisol -11/15 1345--97.4--110--16--87/62--99% on 2L -11/16 0800--98.8--98--16--91/70--100% 2L   G-tube Malfunction -G tube has a hole>>leaking -reconsult general surgery>>placed new Gtube 11/15 -early AM 03/09/22--Gtube "fell out" -discussed with  Dr. Morene Rankins meatus closed>>consult IR to place new PEG via old tract   Hypokalemia Secondary to poor oral intake. Patient states that the Osmolite which was prescribed last month gives him "terrible diarrhea" so he has not been compliant with it. Dietician consult placed and patient will be started on  - Magnesium is normal - replace potassium regularly (he is supposed to be taking daily liquid potassium) - repleted    Stage IV B Metastatic Squamous Cell Cancer - his chemotherapy was placed on temporary hold due to severe weight loss - he is followed by Dr. Delton Coombes - palliative care met with him during last admission and he remained in denial about the severity of illness and wanted to be full code/full scope care  - follow up with Dr. Delton Coombes for ongoing discussion   Frequent Falls - likely exacerbated by severe debility, malnutrition, weight loss - fall precautions recommended - PT ordered recommending SNF    Failure to thrive  - Pt seems unable to care for self at home - PT>>SNF   Severe protein calorie malnutrition - resume regular diet plus PEG tube feeds  - Phosphorous is 2.0.  - Phos has been repleted now up to 2.5 - monitor for refeeding syndrome, recheck Mg, Phos in AM, replete as needed    Refeeding Syndrome - replacing phosphorus and Mg as needed    Hypophosphatemia - IV replacement ordered    AKI Resolved - prerenal from severe dehydration  -serum creatinine peaked 1.32   Tobacco  - nicotine patch ordered - counseled patient on tobacco cessation  Chronic nausea and vomiting - secondary to esophageal malignancy - treat supportively - pt insists upon po intake   History of cavitary pneumonia - completed a 4 week course of augmentin    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  - gabapentin    Leukocytosis  - pt completed 4 week course of augmentin for cavitary pneumonia treatment   DVT prophylaxis: refuses injectables  -->rivaroxaban Code Status: full  Family Communication: none avail Disposition: anticipating SNF [facility]) when bed ready and insurance authorized Remains inpatient appropriate because: IV fluids and electrolytes                 Subjective: Patient denies fevers, chills, headache, chest pain, dyspnea, nausea, vomiting, diarrhea, abdominal pain, dysuria, hematuria, hematochezia, and melena.   Objective: Vitals:   03/08/22 2154 03/09/22 0450 03/09/22 0459 03/09/22 0819  BP: (!) 84/68 (!) 73/56    Pulse: (!) 108 (!) 106    Resp: 20 16    Temp: 98.9 F (37.2 C) 98.5 F (36.9 C)    TempSrc:  Oral    SpO2: 100% 100%  99%  Weight:   39.2 kg   Height:        Intake/Output Summary (Last 24 hours) at 03/09/2022 0837 Last data filed at 03/09/2022 1696 Gross per 24 hour  Intake 2640.8 ml  Output 300 ml  Net 2340.8 ml   Weight change: -0.7 kg Exam:  General:  Pt is alert, follows commands appropriately, not in acute distress HEENT: No icterus, No thrush, No neck mass, [place]/AT Cardiovascular: RRR, S1/S2, no rubs, no gallops Respiratory: bibasilar rales. No wheeze Abdomen: Soft/+BS, non tender, non distended, no guarding Extremities: No edema, No lymphangitis, No petechiae, No rashes, no synovitis   Data Reviewed: I have personally reviewed following labs and imaging studies Basic Metabolic Panel: Recent Labs  Lab 03/03/22 0245 03/04/22 0507 03/05/22 0503 03/06/22 0456 03/07/22 0437 03/09/22 0445  NA 144 139  --  137 136 135  K 4.1 4.7  --  4.8 4.1 3.9  CL 123* 115*  --  114* 109 110  CO2 19* 19*  --  20* 20* 22  GLUCOSE 102* 86  --  109* 152* 87  BUN 30* 20  --  13 19 23*  CREATININE 0.80 0.73  --  0.69 0.68 0.64  CALCIUM 8.7* 8.3*  --  8.4* 8.4* 7.8*  MG  --  1.6* 2.8* 2.1 2.0 1.8  PHOS 2.0* 3.9 2.5 1.3* 2.5 1.4*   Liver Function Tests: No results for input(s): "AST", "ALT", "ALKPHOS", "BILITOT", "PROT", "ALBUMIN" in the last 168 hours. No  results for input(s): "LIPASE", "AMYLASE" in the last 168 hours. No results for input(s): "AMMONIA" in the last 168 hours. Coagulation Profile: No results for input(s): "INR", "PROTIME" in the last 168 hours. CBC: Recent Labs  Lab 03/03/22 0245 03/09/22 0445  WBC 13.0* 5.7  HGB 8.6* 7.6*  HCT 26.9* 23.8*  MCV 81.0 81.2  PLT 131* 257   Cardiac Enzymes: No results for input(s): "CKTOTAL", "CKMB", "CKMBINDEX", "TROPONINI" in the last 168 hours. BNP: Invalid input(s): "POCBNP" CBG: Recent Labs  Lab 03/05/22 0733 03/05/22 1113 03/05/22 1623  GLUCAP 173* 130* 113*   HbA1C: No results for input(s): "HGBA1C" in the last 72 hours. Urine analysis:    Component Value Date/Time   COLORURINE AMBER (A) 02/16/2022 0500   APPEARANCEUR CLOUDY (A) 02/16/2022 0500   LABSPEC >1.046 (H) 02/16/2022 0500   PHURINE 5.0 02/16/2022 0500   GLUCOSEU  NEGATIVE 02/16/2022 0500   HGBUR NEGATIVE 02/16/2022 0500   BILIRUBINUR SMALL (A) 02/16/2022 0500   KETONESUR NEGATIVE 02/16/2022 0500   PROTEINUR 30 (A) 02/16/2022 0500   NITRITE NEGATIVE 02/16/2022 0500   LEUKOCYTESUR NEGATIVE 02/16/2022 0500   Sepsis Labs: _0 (procalcitonin:4,lacticidven:4) )No results found for this or any previous visit (from the past 240 hour(s)).   Scheduled Meds:  allopurinol  300 mg Per Tube Daily   Chlorhexidine Gluconate Cloth  6 each Topical Daily   folic acid  1 mg Per Tube Daily   free water  100 mL Per Tube Q4H   gabapentin  300 mg Per Tube QHS   ipratropium-albuterol  3 mL Nebulization BID   megestrol  400 mg Per Tube BID   midodrine  5 mg Per Tube TID WC   rivaroxaban  10 mg Per Tube Q supper   thiamine  100 mg Per Tube Daily   Continuous Infusions:  feeding supplement (VITAL AF 1.2 CAL) Stopped (03/09/22 0450)    Procedures/Studies: DG Chest Portable 1 View  Result Date: 03/02/2022 CLINICAL DATA:  Issues with port EXAM: PORTABLE CHEST 1 VIEW COMPARISON:  02/14/2022, CT 02/14/2022 FINDINGS:  Left-sided central venous port tip over the SVC. Tubing appears grossly continuous. No pleural effusion or pneumothorax. Stable cardiomediastinal silhouette. Probable skin fold artifact over the left chest. Multiple old bilateral rib fractures. Mild nodularity in the right upper lobe, likely corresponds to tree-in-bud density on the prior CT. IMPRESSION: 1. Left-sided central venous port tip over the SVC. Port tubing appears grossly continuous. 2. Mild nodularity in the right upper lobe adjacent to the fissure, likely corresponds to tree-in-bud density on prior CT. Electronically Signed   By: Donavan Foil M.D.   On: 03/02/2022 23:43   CT HEAD WO CONTRAST (5MM)  Result Date: 03/02/2022 CLINICAL DATA:  Head trauma. EXAM: CT HEAD WITHOUT CONTRAST TECHNIQUE: Contiguous axial images were obtained from the base of the skull through the vertex without intravenous contrast. RADIATION DOSE REDUCTION: This exam was performed according to the departmental dose-optimization program which includes automated exposure control, adjustment of the mA and/or kV according to patient size and/or use of iterative reconstruction technique. COMPARISON:  CT Head 02/28/22 FINDINGS: Brain: No evidence of acute infarction, hemorrhage, hydrocephalus, extra-axial collection or mass lesion/mass effect. Sequela of mild chronic microvascular ischemic change. Vascular: No hyperdense vessel or unexpected calcification. Skull: Normal. Negative for fracture or focal lesion. Sinuses/Orbits: No acute finding. Other: None IMPRESSION: No acute intracranial abnormality. Electronically Signed   By: Marin Roberts M.D.   On: 03/02/2022 19:51   DG Knee 2 Views Left  Result Date: 03/01/2022 CLINICAL DATA:  Trauma EXAM: LEFT KNEE - 1-2 VIEW COMPARISON:  None Available. FINDINGS: Trace knee joint effusion. No fracture. No dislocation. Mild enthesopathic changes of the patella. Sclerotic lesion in the fibula has well-circumscribed margins and is favored to  be benign in the absence of other known osseous metastases. No discernible soft tissue abnormality. IMPRESSION: Trace knee joint effusion. No fracture or dislocation. Electronically Signed   By: Marin Roberts M.D.   On: 03/01/2022 08:21   DG Knee 2 Views Right  Result Date: 03/01/2022 CLINICAL DATA:  Fall with patellar pain. EXAM: RIGHT KNEE - 1-2 VIEW COMPARISON:  None Available. FINDINGS: No evidence of regional fracture. No weight-bearing compartment degenerative disease. 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 No other finding of note. IMPRESSION: No acute or traumatic finding. 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 Electronically Signed   By: Elta Guadeloupe  Shogry M.D.   On: 03/01/2022 08:18   CT Head Wo Contrast  Result Date: 02/28/2022 CLINICAL DATA:  Head trauma, moderate to severe. [place] getting out of bed with laceration to left eyebrow. EXAM: CT HEAD WITHOUT CONTRAST TECHNIQUE: Contiguous axial images were obtained from the base of the skull through the vertex without intravenous contrast. RADIATION DOSE REDUCTION: This exam was performed according to the departmental dose-optimization program which includes automated exposure control, adjustment of the mA and/or kV according to patient size and/or use of iterative reconstruction technique. COMPARISON:  None. FINDINGS: Brain: No acute intracranial hemorrhage, midline shift or mass effect. No extra-axial fluid collection. Diffuse atrophy is noted. Periventricular white matter hypodensities are noted bilaterally. No hydrocephalus. Vascular: There is atherosclerotic calcification of the carotid siphons. No hyperdense vessel. Skull: Normal. Negative for fracture or focal lesion. Sinuses/Orbits: No acute finding. Other: None. IMPRESSION: 1. No acute intracranial process. 2. Atrophy with chronic microvascular ischemic changes. Electronically Signed   By: Brett Fairy M.D.   On: 02/28/2022 03:40   CT Angio Chest PE W and/or Wo  Contrast  Result Date: 02/14/2022 CLINICAL DATA:  Intermittent chest pain x3 days. EXAM: CT ANGIOGRAPHY CHEST WITH CONTRAST TECHNIQUE: Multidetector CT imaging of the chest was performed using the standard protocol during bolus administration of intravenous contrast. Multiplanar CT image reconstructions and MIPs were obtained to evaluate the vascular anatomy. RADIATION DOSE REDUCTION: This exam was performed according to the departmental dose-optimization program which includes automated exposure control, adjustment of the mA and/or kV according to patient size and/or use of iterative reconstruction technique. CONTRAST:  59m OMNIPAQUE IOHEXOL 350 MG/ML SOLN COMPARISON:  January 31, 2022 FINDINGS: Cardiovascular: A left-sided venous Port-A-Cath is noted. There is mild calcification of the aortic arch. The thoracic aorta is otherwise normal in appearance. Satisfactory opacification of the pulmonary arteries to the segmental level. No evidence of pulmonary embolism. Normal heart size with mild coronary artery calcification. No pericardial effusion. Mediastinum/Nodes: No enlarged mediastinal, hilar, or axillary lymph nodes. A dilated and debris filled proximal and mid esophagus is again seen. The distal esophageal mass noted on the prior exam is poorly visualized on the current study. The thyroid gland and trachea demonstrate no significant findings. Lungs/Pleura: Mild lingular, right upper lobe, right middle lobe and right lower lobe tree-in-bud infiltrates are seen. Mild posterior left lower lobe tree-in-bud infiltrate is also noted. These areas are decreased in severity when compared to the prior study. A 17 mm x 11 mm area of prominent bronchiectasis is noted within the posteromedial aspect of the right lower lobe, along an area of central peribronchovascular consolidation seen on the prior exam (axial CT images 58 through 64, CT series 7). A 6 mm area of linear scarring is seen within the posterior aspect of  the right lower lobe (axial CT image 53, CT series 7). An 11 mm thin walled cavitary lesion is noted within this region on the prior study. There is no evidence of a pleural effusion or pneumothorax. Upper Abdomen: A percutaneous gastrostomy tube is in place. Musculoskeletal: Numerous chronic bilateral rib fractures are again seen. Stable chronic compression fracture deformities are seen within the mid and upper thoracic spine. Review of the MIP images confirms the above findings. IMPRESSION: 1. No evidence of pulmonary embolism. 2. Mild bilateral tree-in-bud infiltrates, decreased in severity when compared to the prior study. 3. Dilated and debris filled proximal and mid esophagus without clear visualization of the distal esophageal mass noted on the prior exam. 4. Numerous chronic  bilateral rib fractures and chronic compression fracture deformities within the mid and upper thoracic spine. 5. Percutaneous gastrostomy tube. 6. Aortic atherosclerosis. Aortic Atherosclerosis (ICD10-I70.0). Electronically Signed   By: Virgina Norfolk M.D.   On: 02/14/2022 19:11   DG Chest 2 View  Result Date: 02/14/2022 CLINICAL DATA:  Chest pain EXAM: CHEST - 2 VIEW COMPARISON:  CT 01/31/2022 FINDINGS: Left chest wall port a catheter is noted with tip in the distal SVC. Heart size appears normal. No pleural effusion or edema. No airspace opacities identified. Unchanged remote healed rib fractures. Remote inferior endplate deformity involving the T7 vertebra appears stable. IMPRESSION: No acute cardiopulmonary abnormalities. Electronically Signed   By: Kerby Moors M.D.   On: 02/14/2022 12:16    Orson Eva, DO  [facility]  If 7PM-7AM, please contact night-coverage [email or web address] Password [email or web address] 03/09/2022, 8:37 AM   LOS: 7 days

## 2022-03-09 NOTE — Progress Notes (Signed)
Was called to bedside this morning as the patient's replacement gastrostomy tube was out with tube feeds all over the bed.  The balloon was noted to be deflated.  This balloon was checked and tested before it was placed.  I attempted replacement of the gastrostomy tube, but the opening was already closed.  I discussed with Dr. Carles Collet.  Patient will need IR replacement of the gastrostomy tube.  This is being arranged.

## 2022-03-09 NOTE — Patient Outreach (Signed)
  Care Coordination   Follow Up Visit Note   03/09/2022 Name: Jeter Tomey MRN: [id number] DOB: May 06, 1970  Imanuel Pruiett is a 51 y.o. year old male who sees [name], [place] for primary care. I  was scheduled to outreach to the patient but noticed he is inpatient as of 03/01/22  What matters to the patients health and wellness today?  Schedule Follow up but patient is Inpatient /ED= frequent falls   Goals Addressed               This Visit's Progress     Patient Stated     Improve Nutriton/increase weight (THN) (pt-stated)   Not on track     Care Coordination Interventions: Admissions for falls, nausea & vomiting, poor fluid PEG intake and electrolyte imbalances noted and will follow patient ambulatory, pending discharge plans [facility] case discussion template updated Patient has been hospitalized for the last 5 scheduled [facility] RN CM outreach. Will continue to successfully assist patient when in ambulatory setting       Manage the worsening symptoms of Esophageal cancer (THN) (pt-stated)   Not on track     Care Coordination Interventions: Patient has been hospitalized for the last 5 scheduled [facility] RN CM outreach. Will continue to successfully assist patient when in ambulatory setting         SDOH assessments and interventions completed:  No     Care Coordination Interventions Activated:  Yes  Care Coordination Interventions:  Yes, provided   Follow up plan: Follow up call scheduled for 03/23/22    Encounter Outcome:  Pt. Visit Completed   Eashan Schipani L. Lavina Hamman, RN, BSN, [place] Coordinator Office number [phone]

## 2022-03-09 NOTE — Progress Notes (Signed)
PT Cancellation Note  Patient Details Name: Trevor [name] MRN: [id number] DOB: 1970-05-07   Cancelled Treatment:    Reason Eval/Treat Not Completed: Patient declined, no reason specified Attempted PT session, pt declined.  Stated therapist arrived too late.  Stated he would participate tomorrow if we came between breakfast and lunch.  Ihor [place], LPTA/CLT; CBIS [phone]  Aldona Lento 03/09/2022, 2:15 PM

## 2022-03-10 ENCOUNTER — Ambulatory Visit (HOSPITAL_COMMUNITY)
Admit: 2022-03-10 | Discharge: 2022-03-10 | Disposition: A | Discharge status: Home / Self Care | Payer: 59 | Attending: Internal Medicine | Admitting: Internal Medicine

## 2022-03-10 DIAGNOSIS — E870 Hyperosmolality and hypernatremia: Secondary | ICD-10-CM | POA: Diagnosis not present

## 2022-03-10 DIAGNOSIS — C159 Malignant neoplasm of esophagus, unspecified: Secondary | ICD-10-CM | POA: Diagnosis not present

## 2022-03-10 DIAGNOSIS — K9423 Gastrostomy malfunction: Secondary | ICD-10-CM

## 2022-03-10 DIAGNOSIS — R627 Adult failure to thrive: Secondary | ICD-10-CM | POA: Diagnosis not present

## 2022-03-10 DIAGNOSIS — N179 Acute kidney failure, unspecified: Secondary | ICD-10-CM | POA: Diagnosis not present

## 2022-03-10 HISTORY — PX: IR REPLC GASTRO/COLONIC TUBE PERCUT W/FLUORO: IMG2333

## 2022-03-10 LAB — GLUCOSE, CAPILLARY
Glucose-Capillary: 70 mg/dL (ref 70–99)
Glucose-Capillary: 99 mg/dL (ref 70–99)

## 2022-03-10 MED ORDER — IOHEXOL 300 MG/ML  SOLN
50.0000 mL | Freq: Once | INTRAMUSCULAR | Status: AC | PRN
  Administered 2022-03-10: 20 mL

## 2022-03-10 MED ORDER — MIDAZOLAM HCL 2 MG/2ML IJ SOLN
INTRAMUSCULAR | Status: AC | PRN
  Administered 2022-03-10: 1 mg via INTRAVENOUS

## 2022-03-10 MED ORDER — LIDOCAINE HCL 1 % IJ SOLN
INTRAMUSCULAR | Status: AC
  Filled 2022-03-10: qty 20

## 2022-03-10 MED ORDER — FENTANYL CITRATE (PF) 100 MCG/2ML IJ SOLN
INTRAMUSCULAR | Status: AC | PRN
  Administered 2022-03-10: 25 ug via INTRAVENOUS

## 2022-03-10 MED ORDER — CEFAZOLIN SODIUM-DEXTROSE 2-4 GM/100ML-% IV SOLN
INTRAVENOUS | Status: AC
  Filled 2022-03-10: qty 100

## 2022-03-10 MED ORDER — MIDAZOLAM HCL 2 MG/2ML IJ SOLN
INTRAMUSCULAR | Status: AC
  Filled 2022-03-10: qty 4

## 2022-03-10 MED ORDER — LIDOCAINE VISCOUS HCL 2 % MT SOLN
OROMUCOSAL | Status: AC
  Filled 2022-03-10: qty 15

## 2022-03-10 MED ORDER — GLUCAGON HCL RDNA (DIAGNOSTIC) 1 MG IJ SOLR
INTRAMUSCULAR | Status: AC
  Filled 2022-03-10: qty 1

## 2022-03-10 MED ORDER — FENTANYL CITRATE (PF) 100 MCG/2ML IJ SOLN
INTRAMUSCULAR | Status: AC
  Filled 2022-03-10: qty 4

## 2022-03-10 MED ORDER — CEFAZOLIN SODIUM-DEXTROSE 2-4 GM/100ML-% IV SOLN
INTRAVENOUS | Status: AC | PRN
  Administered 2022-03-10: 2 g via INTRAVENOUS

## 2022-03-10 MED ORDER — LIDOCAINE HCL 1 % IJ SOLN: 20.0000 mL | Freq: Once | INTRAMUSCULAR | Status: DC

## 2022-03-10 MED ORDER — DEXTROSE 50 % IV SOLN: 0.5000 | Freq: Once | INTRAVENOUS | Status: DC

## 2022-03-10 NOTE — Sedation Documentation (Signed)
PA assessed patient on arrival

## 2022-03-10 NOTE — Progress Notes (Signed)
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 He has frequent falls at home worse in last couple of days. He is dizzy all the time.  He is not doing water flushes in PEG and continues to smoke.  He refuses SNF.  His chemotherapy was placed on hold due to severe weight loss.  He does not accept that he is dying. He wants to remain full code.  He demands to be admitted to [facility], adamantly refusing to stay at [facility].  He was noted to have an AKI with severe hypernatremia consistent with worsening volume and nutritional status.  He was agreeable to IV fluids overnight but wants to be discharged home tomorrow.   He again refuses to be admitted to [facility].   As the hospitalization progress,  he ultimately agreed to stay at [facility] after he came to the realization that he would not receive any different care at [facility].  He was started on hypotonic fluid  and free water flushes with improvement in his hypernatremia.     Assessment/Plan:  Hypernatremia / Severe Dehydration  - He has severe free water deficiency - Receiving D5W infusion (35 ml/hr) and free water (100 ml q4h) -Sodium normalized after supplementing adequate free water and IVF   Hypotension -pt has had soft BPs for most of his hospitalization -check lactic 1.2 -check PCT 0.13 -rebolus LR -started midodrine -am cortisol -11/15 1345--97.4--110--16--87/62--99% on 2L -11/16 0800--98.8--98--16--91/70--100% 2L   G-tube Malfunction -G tube has a hole>>leaking -reconsult general surgery>>placed new Gtube 11/15 -early AM 03/09/22--Gtube "fell out" -discussed with  Dr. Morene Rankins meatus closed>>consult IR to place new PEG via old tract -11/17--new G-tube placed by IR>>restart enteral feeding   Hypokalemia Secondary to poor oral intake. Patient states that the Osmolite which was prescribed last month gives him "terrible diarrhea" so he has not been compliant with it. Dietician consult placed and patient will be started on  - Magnesium is normal - replace potassium regularly (he is supposed to be taking daily liquid potassium) - repleted    Stage IV B Metastatic Squamous Cell Cancer - his chemotherapy was placed on temporary hold due to severe weight loss - he is followed by Dr. Delton Coombes - palliative care met with him during last admission and he remained in denial about the severity of illness and wanted to be full code/full scope care  - follow up with Dr. Delton Coombes for ongoing discussion   Frequent Falls - likely exacerbated by severe debility, malnutrition, weight loss - fall precautions recommended - PT ordered recommending SNF    Failure to thrive  - Pt seems unable to care for self at home - PT>>SNF   Severe protein calorie malnutrition - resume regular diet plus PEG tube feeds  - Phosphorous is 2.0.  - Phos has been repleted now up to 2.5 - monitor for refeeding syndrome, recheck Mg, Phos in AM, replete as needed    Refeeding Syndrome - replacing phosphorus and Mg as needed    Hypophosphatemia - IV replacement ordered    AKI Resolved - prerenal from severe dehydration  -serum creatinine peaked 1.32   Tobacco  - nicotine patch ordered -  counseled patient on tobacco cessation    Chronic nausea and vomiting - secondary to esophageal malignancy - treat supportively - pt insists upon po intake   History of cavitary pneumonia - completed a 4 week course of augmentin    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  - gabapentin    Leukocytosis  - pt completed 4 week course of augmentin for cavitary  pneumonia treatment   DVT prophylaxis: refuses injectables -->rivaroxaban Code Status: full  Family Communication: none avail Disposition: anticipating SNF [facility]) when bed ready and insurance authorized Remains inpatient appropriate because: IV fluids and electrolytes                Subjective: Has occasional n/v with oral food.  Denies f/c, cp, sob, abd pain  Objective: Vitals:   03/09/22 2329 03/10/22 0058 03/10/22 0500 03/10/22 1100  BP: 92/66 95/69  110/79  Pulse: (!) 101 98  (!) 108  Resp: _0 Temp: 98.9 F (37.2 C) 98.7 F (37.1 C)  98.3 F (36.8 C)  TempSrc: Oral Oral  Oral  SpO2:    100%  Weight:   39.4 kg   Height:        Intake/Output Summary (Last 24 hours) at 03/10/2022 1741 Last data filed at 03/10/2022 1432 Gross per 24 hour  Intake 240 ml  Output 200 ml  Net 40 ml   Weight change: 0.2 kg Exam:  General:  Pt is alert, follows commands appropriately, not in acute distress HEENT: No icterus, No thrush, No neck mass, [place]/AT Cardiovascular: RRR, S1/S2, no rubs, no gallops Respiratory: bibasilar rales. No wheeze Abdomen: Soft/+BS, non tender, non distended, no guarding Extremities: No edema, No lymphangitis, No petechiae, No rashes, no synovitis   Data Reviewed: I have personally reviewed following labs and imaging studies Basic Metabolic Panel: Recent Labs  Lab 03/04/22 0507 03/05/22 0503 03/06/22 0456 03/07/22 0437 03/09/22 0445  NA 139  --  137 136 135  K 4.7  --  4.8 4.1 3.9  CL 115*  --  114* 109 110  CO2 19*  --  20* 20* 22  GLUCOSE 86  --  109* 152* 87  BUN 20  --  13 19 23*  CREATININE 0.73  --  0.69 0.68 0.64  CALCIUM 8.3*  --  8.4* 8.4* 7.8*  MG 1.6* 2.8* 2.1 2.0 1.8  PHOS 3.9 2.5 1.3* 2.5 1.4*   Liver Function Tests: No results for input(s): "AST", "ALT", "ALKPHOS", "BILITOT", "PROT", "ALBUMIN" in the last 168 hours. No results for input(s): "LIPASE", "AMYLASE" in the last 168 hours. No results for  input(s): "AMMONIA" in the last 168 hours. Coagulation Profile: No results for input(s): "INR", "PROTIME" in the last 168 hours. CBC: Recent Labs  Lab 03/09/22 0445  WBC 5.7  HGB 7.6*  HCT 23.8*  MCV 81.2  PLT 257   Cardiac Enzymes: No results for input(s): "CKTOTAL", "CKMB", "CKMBINDEX", "TROPONINI" in the last 168 hours. BNP: Invalid input(s): "POCBNP" CBG: Recent Labs  Lab 03/05/22 0733 03/05/22 1113 03/05/22 1623 03/10/22 0740  GLUCAP 173* 130* 113* 70   HbA1C: No results for input(s): "HGBA1C" in the last 72 hours. Urine analysis:    Component Value Date/Time   COLORURINE AMBER (A) 02/16/2022 0500   APPEARANCEUR CLOUDY (A) 02/16/2022 0500   LABSPEC >1.046 (H) 02/16/2022 0500   PHURINE 5.0 02/16/2022 0500   GLUCOSEU NEGATIVE 02/16/2022 0500   HGBUR NEGATIVE 02/16/2022 0500   BILIRUBINUR SMALL (A) 02/16/2022 0500  KETONESUR NEGATIVE 02/16/2022 0500   PROTEINUR 30 (A) 02/16/2022 0500   NITRITE NEGATIVE 02/16/2022 0500   LEUKOCYTESUR NEGATIVE 02/16/2022 0500   Sepsis Labs: _0 (procalcitonin:4,lacticidven:4) )No results found for this or any previous visit (from the past 240 hour(s)).   Scheduled Meds:  allopurinol  300 mg Per Tube Daily   Chlorhexidine Gluconate Cloth  6 each Topical Daily   dextrose  0.5 ampule Intravenous Once   folic acid  1 mg Per Tube Daily   free water  100 mL Per Tube Q4H   gabapentin  300 mg Per Tube QHS   megestrol  400 mg Per Tube BID   midodrine  5 mg Per Tube TID WC   rivaroxaban  10 mg Per Tube Q supper   thiamine  100 mg Per Tube Daily   Continuous Infusions:  feeding supplement (VITAL AF 1.2 CAL) Stopped (03/09/22 0450)    Procedures/Studies: IR Replc Gastro/Colonic Tube Percut W/Fluoro  Result Date: 03/10/2022 INDICATION: 51 year old male with a history of surgically placed gastrostomy tube which was inadvertently displaced. Tube could not be replaced at bedside at outside hospital so patient now presents to  [facility] interventional radiology for tube replacement. EXAM: GASTROSTOMY CATHETER REPLACEMENT MEDICATIONS: Ancef 2 g ANESTHESIA/SEDATION: Versed 1 mg IV; Fentanyl 25 mcg IV Moderate Sedation Time:  9 minutes The patient's vital signs and level of consciousness were continuously monitored during the procedure by the interventional radiology nurse under my direct supervision. CONTRAST:  46m OMNIPAQUE IOHEXOL 300 MG/ML SOLN - administered into the gastric lumen. FLUOROSCOPY TIME:  Radiation exposure index: 1 mGy reference air kerma COMPLICATIONS: None immediate. PROCEDURE: Informed written consent was obtained from the patient after a thorough discussion of the procedural risks, benefits and alternatives. All questions were addressed. Maximal Sterile Barrier Technique was utilized including caps, mask, sterile gowns, sterile gloves, sterile drape, hand hygiene and skin antiseptic. A timeout was performed prior to the initiation of the procedure. The prior gastrostomy tube ostomy was anesthetized by infiltration with 1% lidocaine. Using a hemostat, the overlying scab was debrided. The hemostat was then used to gently open the ostomy tract. A 5 [place] the catheter was navigated through the ostomy and into the stomach. Contrast was injected confirming that the catheter is indeed within the gastric fundus. A wire was placed. The existing ostomy tract was then dilated to 20 [place] into a 263French percutaneous gastrostomy tube was lubricated and advanced over the wire into the stomach. The retention balloon was filled with 15 mL saline and pulled snug against the anterior abdominal wall. The external bumper was fixed in place. Contrast was injected through the tube confirming its location within the stomach. The tube was then flushed with saline and capped. The patient tolerated the procedure well. IMPRESSION: Successful replacement of 20 French percutaneous gastrostomy tube. Electronically Signed   By: HJacqulynn CadetM.D.   On: 03/10/2022 10:10   DG Chest Portable 1 View  Result Date: 03/02/2022 CLINICAL DATA:  Issues with port EXAM: PORTABLE CHEST 1 VIEW COMPARISON:  02/14/2022, CT 02/14/2022 FINDINGS: Left-sided central venous port tip over the SVC. Tubing appears grossly continuous. No pleural effusion or pneumothorax. Stable cardiomediastinal silhouette. Probable skin fold artifact over the left chest. Multiple old bilateral rib fractures. Mild nodularity in the right upper lobe, likely corresponds to tree-in-bud density on the prior CT. IMPRESSION: 1. Left-sided central venous port tip over the SVC. Port tubing appears grossly continuous. 2. Mild nodularity in the right upper lobe  adjacent to the fissure, likely corresponds to tree-in-bud density on prior CT. Electronically Signed   By: Donavan Foil M.D.   On: 03/02/2022 23:43   CT HEAD WO CONTRAST (5MM)  Result Date: 03/02/2022 CLINICAL DATA:  Head trauma. EXAM: CT HEAD WITHOUT CONTRAST TECHNIQUE: Contiguous axial images were obtained from the base of the skull through the vertex without intravenous contrast. RADIATION DOSE REDUCTION: This exam was performed according to the departmental dose-optimization program which includes automated exposure control, adjustment of the mA and/or kV according to patient size and/or use of iterative reconstruction technique. COMPARISON:  CT Head 02/28/22 FINDINGS: Brain: No evidence of acute infarction, hemorrhage, hydrocephalus, extra-axial collection or mass lesion/mass effect. Sequela of mild chronic microvascular ischemic change. Vascular: No hyperdense vessel or unexpected calcification. Skull: Normal. Negative for fracture or focal lesion. Sinuses/Orbits: No acute finding. Other: None IMPRESSION: No acute intracranial abnormality. Electronically Signed   By: Marin Roberts M.D.   On: 03/02/2022 19:51   DG Knee 2 Views Left  Result Date: 03/01/2022 CLINICAL DATA:  Trauma EXAM: LEFT KNEE - 1-2 VIEW COMPARISON:   None Available. FINDINGS: Trace knee joint effusion. No fracture. No dislocation. Mild enthesopathic changes of the patella. Sclerotic lesion in the fibula has well-circumscribed margins and is favored to be benign in the absence of other known osseous metastases. No discernible soft tissue abnormality. IMPRESSION: Trace knee joint effusion. No fracture or dislocation. Electronically Signed   By: Marin Roberts M.D.   On: 03/01/2022 08:21   DG Knee 2 Views Right  Result Date: 03/01/2022 CLINICAL DATA:  Fall with patellar pain. EXAM: RIGHT KNEE - 1-2 VIEW COMPARISON:  None Available. FINDINGS: No evidence of regional fracture. No weight-bearing compartment degenerative disease. 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 No other finding of note. IMPRESSION: No acute or traumatic finding. 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 Electronically Signed   By: Nelson Chimes M.D.   On: 03/01/2022 08:18   CT Head Wo Contrast  Result Date: 02/28/2022 CLINICAL DATA:  Head trauma, moderate to severe. [place] getting out of bed with laceration to left eyebrow. EXAM: CT HEAD WITHOUT CONTRAST TECHNIQUE: Contiguous axial images were obtained from the base of the skull through the vertex without intravenous contrast. RADIATION DOSE REDUCTION: This exam was performed according to the departmental dose-optimization program which includes automated exposure control, adjustment of the mA and/or kV according to patient size and/or use of iterative reconstruction technique. COMPARISON:  None. FINDINGS: Brain: No acute intracranial hemorrhage, midline shift or mass effect. No extra-axial fluid collection. Diffuse atrophy is noted. Periventricular white matter hypodensities are noted bilaterally. No hydrocephalus. Vascular: There is atherosclerotic calcification of the carotid siphons. No hyperdense vessel. Skull: Normal. Negative for fracture or focal lesion. Sinuses/Orbits: No acute finding. Other: None.  IMPRESSION: 1. No acute intracranial process. 2. Atrophy with chronic microvascular ischemic changes. Electronically Signed   By: Brett Fairy M.D.   On: 02/28/2022 03:40   CT Angio Chest PE W and/or Wo Contrast  Result Date: 02/14/2022 CLINICAL DATA:  Intermittent chest pain x3 days. EXAM: CT ANGIOGRAPHY CHEST WITH CONTRAST TECHNIQUE: Multidetector CT imaging of the chest was performed using the standard protocol during bolus administration of intravenous contrast. Multiplanar CT image reconstructions and MIPs were obtained to evaluate the vascular anatomy. RADIATION DOSE REDUCTION: This exam was performed according to the departmental dose-optimization program which includes automated exposure control, adjustment of the mA and/or kV according to patient size and/or use of iterative reconstruction technique. CONTRAST:  64m  OMNIPAQUE IOHEXOL 350 MG/ML SOLN COMPARISON:  January 31, 2022 FINDINGS: Cardiovascular: A left-sided venous Port-A-Cath is noted. There is mild calcification of the aortic arch. The thoracic aorta is otherwise normal in appearance. Satisfactory opacification of the pulmonary arteries to the segmental level. No evidence of pulmonary embolism. Normal heart size with mild coronary artery calcification. No pericardial effusion. Mediastinum/Nodes: No enlarged mediastinal, hilar, or axillary lymph nodes. A dilated and debris filled proximal and mid esophagus is again seen. The distal esophageal mass noted on the prior exam is poorly visualized on the current study. The thyroid gland and trachea demonstrate no significant findings. Lungs/Pleura: Mild lingular, right upper lobe, right middle lobe and right lower lobe tree-in-bud infiltrates are seen. Mild posterior left lower lobe tree-in-bud infiltrate is also noted. These areas are decreased in severity when compared to the prior study. A 17 mm x 11 mm area of prominent bronchiectasis is noted within the posteromedial aspect of the right lower  lobe, along an area of central peribronchovascular consolidation seen on the prior exam (axial CT images 58 through 64, CT series 7). A 6 mm area of linear scarring is seen within the posterior aspect of the right lower lobe (axial CT image 53, CT series 7). An 11 mm thin walled cavitary lesion is noted within this region on the prior study. There is no evidence of a pleural effusion or pneumothorax. Upper Abdomen: A percutaneous gastrostomy tube is in place. Musculoskeletal: Numerous chronic bilateral rib fractures are again seen. Stable chronic compression fracture deformities are seen within the mid and upper thoracic spine. Review of the MIP images confirms the above findings. IMPRESSION: 1. No evidence of pulmonary embolism. 2. Mild bilateral tree-in-bud infiltrates, decreased in severity when compared to the prior study. 3. Dilated and debris filled proximal and mid esophagus without clear visualization of the distal esophageal mass noted on the prior exam. 4. Numerous chronic bilateral rib fractures and chronic compression fracture deformities within the mid and upper thoracic spine. 5. Percutaneous gastrostomy tube. 6. Aortic atherosclerosis. Aortic Atherosclerosis (ICD10-I70.0). Electronically Signed   By: Virgina Norfolk M.D.   On: 02/14/2022 19:11   DG Chest 2 View  Result Date: 02/14/2022 CLINICAL DATA:  Chest pain EXAM: CHEST - 2 VIEW COMPARISON:  CT 01/31/2022 FINDINGS: Left chest wall port a catheter is noted with tip in the distal SVC. Heart size appears normal. No pleural effusion or edema. No airspace opacities identified. Unchanged remote healed rib fractures. Remote inferior endplate deformity involving the T7 vertebra appears stable. IMPRESSION: No acute cardiopulmonary abnormalities. Electronically Signed   By: Kerby Moors M.D.   On: 02/14/2022 12:16    Orson Eva, DO  [facility]  If 7PM-7AM, please contact night-coverage [email or web address] Password [email or web address] 03/10/2022, 5:41  PM   LOS: 8 days

## 2022-03-10 NOTE — Progress Notes (Signed)
I entered the room and the patient immediately requested to have 2 cokes to drink. I witnessed the patient spitting out yellow liquid into the trash can.

## 2022-03-10 NOTE — TOC Progression Note (Signed)
Transition of Care [facility]) - Progression Note    Patient Details  Name: Trevor [name] MRN: [id number] Date of Birth: December 15, 1970  [facility]) CM/SW Contact  Boneta Lucks, RN Phone Number: 03/10/2022, 12:52 PM  Clinical Narrative:   Patient at [facility] to get peg tub replaced. INS AUTH for [facility] expires today. TOV asked Debbie to see if they will extend the [facility] or restart. DC planning for weekend discharge.    Expected Discharge Plan: [place] Barriers to Discharge: Insurance Authorization  Expected Discharge Plan and Services Expected Discharge Plan: [place] In-house Referral: Clinical Social Work Discharge Planning Services: CM Consult Post Acute Care Choice: [place] Living arrangements for the past 2 months: Apartment Expected Discharge Date: 03/03/22                                     Social Determinants of Health (SDOH) Interventions Housing Interventions: Intervention Not Indicated  Readmission Risk Interventions    03/03/2022   11:46 AM 01/24/2022    3:13 PM  Readmission Risk Prevention Plan  Transportation Screening Complete Complete  PCP or Specialist Appt within 3-5 Days  Complete  HRI or [place]  Complete  Social Work Consult for [place] Planning/Counseling  Complete  Palliative Care Screening  Complete  Medication Review Press photographer) Complete Complete  HRI or [place] Complete   SW Recovery Care/Counseling Consult Complete   Palliative Care Screening Complete   [place] Patient Refused

## 2022-03-10 NOTE — Progress Notes (Signed)
Patient agitated, stating he wants his food before we hook him up to anything , Gtube is intact  To left lower abdomen . Patient refused to let RN and LPN place back on TF until her receives a regular try    03/10/22 1100  Vitals  Temp 98.3 F (36.8 C)  Temp Source Oral  BP 110/79  Pulse Rate (!) 108  Resp 20  MEWS COLOR  MEWS Score Color Green  Oxygen Therapy  SpO2 100 %  O2 Device Room Air  MEWS Score  MEWS Temp 0  MEWS Systolic 0  MEWS Pulse 1  MEWS RR 0  MEWS LOC 0  MEWS Score 1

## 2022-03-11 ENCOUNTER — Other Ambulatory Visit: Payer: Self-pay

## 2022-03-11 DIAGNOSIS — C159 Malignant neoplasm of esophagus, unspecified: Secondary | ICD-10-CM | POA: Diagnosis not present

## 2022-03-11 DIAGNOSIS — R627 Adult failure to thrive: Secondary | ICD-10-CM | POA: Diagnosis not present

## 2022-03-11 DIAGNOSIS — E870 Hyperosmolality and hypernatremia: Secondary | ICD-10-CM | POA: Diagnosis not present

## 2022-03-11 DIAGNOSIS — Z931 Gastrostomy status: Secondary | ICD-10-CM | POA: Diagnosis not present

## 2022-03-11 LAB — CBC
HCT: 27.5 % — ABNORMAL LOW (ref 39.0–52.0)
Hemoglobin: 8.7 g/dL — ABNORMAL LOW (ref 13.0–17.0)
MCH: 26.4 pg (ref 26.0–34.0)
MCHC: 31.6 g/dL (ref 30.0–36.0)
MCV: 83.3 fL (ref 80.0–100.0)
Platelets: 434 10*3/uL — ABNORMAL HIGH (ref 150–400)
RBC: 3.3 MIL/uL — ABNORMAL LOW (ref 4.22–5.81)
RDW: 20.6 % — ABNORMAL HIGH (ref 11.5–15.5)
WBC: 8.7 10*3/uL (ref 4.0–10.5)
nRBC: 0 % (ref 0.0–0.2)

## 2022-03-11 LAB — BASIC METABOLIC PANEL
Anion gap: 6 (ref 5–15)
BUN: 28 mg/dL — ABNORMAL HIGH (ref 6–20)
CO2: 22 mmol/L (ref 22–32)
Calcium: 9 mg/dL (ref 8.9–10.3)
Chloride: 116 mmol/L — ABNORMAL HIGH (ref 98–111)
Creatinine, Ser: 0.7 mg/dL (ref 0.61–1.24)
GFR, Estimated: >60 mL/min (ref >60)
Glucose, Bld: 102 mg/dL — ABNORMAL HIGH (ref 70–99)
Potassium: 3 mmol/L — ABNORMAL LOW (ref 3.5–5.1)
Sodium: 144 mmol/L (ref 135–145)

## 2022-03-11 LAB — PHOSPHORUS: Phosphorus: 2.8 mg/dL (ref 2.5–4.6)

## 2022-03-11 LAB — MAGNESIUM: Magnesium: 2 mg/dL (ref 1.7–2.4)

## 2022-03-11 MED ORDER — POTASSIUM CHLORIDE 20 MEQ PO PACK
40.0000 meq | PACK | Freq: Once | ORAL | Status: AC
  Administered 2022-03-11: 40 meq
  Filled 2022-03-11: qty 2

## 2022-03-11 MED ORDER — SODIUM CHLORIDE 0.45 % IV BOLUS
1000.0000 mL | Freq: Once | INTRAVENOUS | Status: AC
  Administered 2022-03-11: 1000 mL via INTRAVENOUS

## 2022-03-11 NOTE — Progress Notes (Signed)
Patient requested for me to call the MD to double portions added to his diet order. Per patient statement, " I need double portions on my diet order so that I can get up to 100 pounds so that I can start back on my chemo treatments" Message sent to MD via secure chat.

## 2022-03-11 NOTE — Progress Notes (Signed)
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 He has frequent falls at home worse in last couple of days. He is dizzy all the time.  He is not doing water flushes in PEG and continues to smoke.  He refuses SNF.  His chemotherapy was placed on hold due to severe weight loss.  He does not accept that he is dying. He wants to remain full code.  He demands to be admitted to [facility], adamantly refusing to stay at [facility].  He was noted to have an AKI with severe hypernatremia consistent with worsening volume and nutritional status.  He was agreeable to IV fluids overnight but wants to be discharged home tomorrow.   He again refuses to be admitted to [facility].   As the hospitalization progress,  he ultimately agreed to stay at [facility] after he came to the realization that he would not receive any different care at [facility].  He was started on hypotonic fluid  and free water flushes with improvement in his hypernatremia.     Assessment/Plan: Hypernatremia / Severe Dehydration  - He has severe free water deficiency - Receiving D5W infusion (35 ml/hr) and free water (100 ml q4h) -Sodium normalized after supplementing adequate free water and IVF -give 1/2NS bolus   Hypotension -pt has had soft BPs for most of his hospitalization -check lactic 1.2 -check PCT 0.13 -rebolus LR -started midodrine -am cortisol 4.0 -11/15 1345--97.4--110--16--87/62--99% on 2L -11/16 0800--98.8--98--16--91/70--100% 2L   G-tube Malfunction -G tube has a hole>>leaking -reconsult general surgery>>placed new Gtube 11/15 -early AM 03/09/22--Gtube "fell  out" -discussed with Dr. Morene Rankins meatus closed>>consult IR to place new PEG via old tract -11/17--new G-tube placed by IR>>restart enteral feeding   Hypokalemia Secondary to poor oral intake. Patient states that the Osmolite which was prescribed last month gives him "terrible diarrhea" so he has not been compliant with it. Dietician consult placed and patient will be started on  - Magnesium is normal - replace potassium regularly (he is supposed to be taking daily liquid potassium) - repleted    Stage IV B Metastatic Squamous Cell Cancer - his chemotherapy was placed on temporary hold due to severe weight loss - he is followed by Dr. Delton Coombes - palliative care met with him during last admission and he remained in denial about the severity of illness and wanted to be full code/full scope care  - follow up with Dr. Delton Coombes for ongoing discussion   Frequent Falls - likely exacerbated by severe debility, malnutrition, weight loss - fall precautions recommended - PT ordered recommending SNF    Failure to thrive  - Pt seems unable to care for self at home - PT>>SNF   Severe protein calorie malnutrition - resume regular diet plus PEG tube feeds  - Phosphorous is 2.0.  - Phos has been repleted now up to 2.5 - monitor for refeeding syndrome, recheck Mg, Phos in AM, replete as needed    Refeeding Syndrome - replacing phosphorus and Mg as needed    Hypophosphatemia - IV replacement ordered    AKI Resolved - prerenal from severe dehydration  -serum creatinine peaked 1.32   Tobacco  -  nicotine patch ordered - counseled patient on tobacco cessation    Chronic nausea and vomiting - secondary to esophageal malignancy - treat supportively - pt insists upon po intake, will not follow medical advice   History of cavitary pneumonia - completed a 4 week course of augmentin    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  - gabapentin    Leukocytosis  - pt  completed 4 week course of augmentin for cavitary pneumonia treatment   DVT prophylaxis: refuses injectables -->rivaroxaban Code Status: full  Family Communication: none avail Disposition: anticipating SNF [facility]) when bed ready and insurance authorized Remains inpatient appropriate because: IV fluids and electrolytes            Subjective:   Objective: Vitals:   03/10/22 1100 03/10/22 2100 03/11/22 0452 03/11/22 0544  BP: 110/79  114/85   Pulse: (!) 108 87 (!) 110   Resp: _0 Temp: 98.3 F (36.8 C)  98.1 F (36.7 C)   TempSrc: Oral     SpO2: 100% 97% 100%   Weight:    40 kg  Height:        Intake/Output Summary (Last 24 hours) at 03/11/2022 1145 Last data filed at 03/10/2022 1904 Gross per 24 hour  Intake 480 ml  Output 100 ml  Net 380 ml   Weight change: 0.6 kg Exam:  General:  Pt is alert, follows commands appropriately, not in acute distress HEENT: No icterus, No thrush, No neck mass, [place]/AT Cardiovascular: RRR, S1/S2, no rubs, no gallops Respiratory: bibasilar rales. No wheeze Abdomen: Soft/+BS, non tender, non distended, no guarding Extremities: No edema, No lymphangitis, No petechiae, No rashes, no synovitis   Data Reviewed: I have personally reviewed following labs and imaging studies Basic Metabolic Panel: Recent Labs  Lab 03/05/22 0503 03/06/22 0456 03/07/22 0437 03/09/22 0445 03/11/22 0527  NA  --  137 136 135 144  K  --  4.8 4.1 3.9 3.0*  CL  --  114* 109 110 116*  CO2  --  20* 20* 22 22  GLUCOSE  --  109* 152* 87 102*  BUN  --  13 19 23* 28*  CREATININE  --  0.69 0.68 0.64 0.70  CALCIUM  --  8.4* 8.4* 7.8* 9.0  MG 2.8* 2.1 2.0 1.8 2.0  PHOS 2.5 1.3* 2.5 1.4* 2.8   Liver Function Tests: No results for input(s): "AST", "ALT", "ALKPHOS", "BILITOT", "PROT", "ALBUMIN" in the last 168 hours. No results for input(s): "LIPASE", "AMYLASE" in the last 168 hours. No results for input(s): "AMMONIA" in the last 168  hours. Coagulation Profile: No results for input(s): "INR", "PROTIME" in the last 168 hours. CBC: Recent Labs  Lab 03/09/22 0445 03/11/22 0527  WBC 5.7 8.7  HGB 7.6* 8.7*  HCT 23.8* 27.5*  MCV 81.2 83.3  PLT 257 434*   Cardiac Enzymes: No results for input(s): "CKTOTAL", "CKMB", "CKMBINDEX", "TROPONINI" in the last 168 hours. BNP: Invalid input(s): "POCBNP" CBG: Recent Labs  Lab 03/05/22 0733 03/05/22 1113 03/05/22 1623 03/10/22 0740 03/10/22 1650  GLUCAP 173* 130* 113* 70 99   HbA1C: No results for input(s): "HGBA1C" in the last 72 hours. Urine analysis:    Component Value Date/Time   COLORURINE AMBER (A) 02/16/2022 0500   APPEARANCEUR CLOUDY (A) 02/16/2022 0500   LABSPEC >1.046 (H) 02/16/2022 0500   PHURINE 5.0 02/16/2022 0500   GLUCOSEU NEGATIVE 02/16/2022 0500   HGBUR NEGATIVE 02/16/2022 0500   BILIRUBINUR SMALL (A) 02/16/2022 0500  KETONESUR NEGATIVE 02/16/2022 0500   PROTEINUR 30 (A) 02/16/2022 0500   NITRITE NEGATIVE 02/16/2022 0500   LEUKOCYTESUR NEGATIVE 02/16/2022 0500   Sepsis Labs: _0 (procalcitonin:4,lacticidven:4) )No results found for this or any previous visit (from the past 240 hour(s)).   Scheduled Meds:  allopurinol  300 mg Per Tube Daily   Chlorhexidine Gluconate Cloth  6 each Topical Daily   dextrose  0.5 ampule Intravenous Once   folic acid  1 mg Per Tube Daily   free water  100 mL Per Tube Q4H   gabapentin  300 mg Per Tube QHS   megestrol  400 mg Per Tube BID   midodrine  5 mg Per Tube TID WC   potassium chloride  40 mEq Per Tube Once   rivaroxaban  10 mg Per Tube Q supper   thiamine  100 mg Per Tube Daily   Continuous Infusions:  feeding supplement (VITAL AF 1.2 CAL) Stopped (03/09/22 0450)   sodium chloride      Procedures/Studies: IR Replc Gastro/Colonic Tube Percut W/Fluoro  Result Date: 03/10/2022 INDICATION: 51 year old male with a history of surgically placed gastrostomy tube which was inadvertently  displaced. Tube could not be replaced at bedside at outside hospital so patient now presents to [facility] interventional radiology for tube replacement. EXAM: GASTROSTOMY CATHETER REPLACEMENT MEDICATIONS: Ancef 2 g ANESTHESIA/SEDATION: Versed 1 mg IV; Fentanyl 25 mcg IV Moderate Sedation Time:  9 minutes The patient's vital signs and level of consciousness were continuously monitored during the procedure by the interventional radiology nurse under my direct supervision. CONTRAST:  59m OMNIPAQUE IOHEXOL 300 MG/ML SOLN - administered into the gastric lumen. FLUOROSCOPY TIME:  Radiation exposure index: 1 mGy reference air kerma COMPLICATIONS: None immediate. PROCEDURE: Informed written consent was obtained from the patient after a thorough discussion of the procedural risks, benefits and alternatives. All questions were addressed. Maximal Sterile Barrier Technique was utilized including caps, mask, sterile gowns, sterile gloves, sterile drape, hand hygiene and skin antiseptic. A timeout was performed prior to the initiation of the procedure. The prior gastrostomy tube ostomy was anesthetized by infiltration with 1% lidocaine. Using a hemostat, the overlying scab was debrided. The hemostat was then used to gently open the ostomy tract. A 5 [place] the catheter was navigated through the ostomy and into the stomach. Contrast was injected confirming that the catheter is indeed within the gastric fundus. A wire was placed. The existing ostomy tract was then dilated to 20 [place] into a 214French percutaneous gastrostomy tube was lubricated and advanced over the wire into the stomach. The retention balloon was filled with 15 mL saline and pulled snug against the anterior abdominal wall. The external bumper was fixed in place. Contrast was injected through the tube confirming its location within the stomach. The tube was then flushed with saline and capped. The patient tolerated the procedure well. IMPRESSION:  Successful replacement of 20 French percutaneous gastrostomy tube. Electronically Signed   By: HJacqulynn CadetM.D.   On: 03/10/2022 10:10   DG Chest Portable 1 View  Result Date: 03/02/2022 CLINICAL DATA:  Issues with port EXAM: PORTABLE CHEST 1 VIEW COMPARISON:  02/14/2022, CT 02/14/2022 FINDINGS: Left-sided central venous port tip over the SVC. Tubing appears grossly continuous. No pleural effusion or pneumothorax. Stable cardiomediastinal silhouette. Probable skin fold artifact over the left chest. Multiple old bilateral rib fractures. Mild nodularity in the right upper lobe, likely corresponds to tree-in-bud density on the prior CT. IMPRESSION: 1. Left-sided central venous port tip  over the SVC. Port tubing appears grossly continuous. 2. Mild nodularity in the right upper lobe adjacent to the fissure, likely corresponds to tree-in-bud density on prior CT. Electronically Signed   By: Donavan Foil M.D.   On: 03/02/2022 23:43   CT HEAD WO CONTRAST (5MM)  Result Date: 03/02/2022 CLINICAL DATA:  Head trauma. EXAM: CT HEAD WITHOUT CONTRAST TECHNIQUE: Contiguous axial images were obtained from the base of the skull through the vertex without intravenous contrast. RADIATION DOSE REDUCTION: This exam was performed according to the departmental dose-optimization program which includes automated exposure control, adjustment of the mA and/or kV according to patient size and/or use of iterative reconstruction technique. COMPARISON:  CT Head 02/28/22 FINDINGS: Brain: No evidence of acute infarction, hemorrhage, hydrocephalus, extra-axial collection or mass lesion/mass effect. Sequela of mild chronic microvascular ischemic change. Vascular: No hyperdense vessel or unexpected calcification. Skull: Normal. Negative for fracture or focal lesion. Sinuses/Orbits: No acute finding. Other: None IMPRESSION: No acute intracranial abnormality. Electronically Signed   By: Marin Roberts M.D.   On: 03/02/2022 19:51   DG Knee 2  Views Left  Result Date: 03/01/2022 CLINICAL DATA:  Trauma EXAM: LEFT KNEE - 1-2 VIEW COMPARISON:  None Available. FINDINGS: Trace knee joint effusion. No fracture. No dislocation. Mild enthesopathic changes of the patella. Sclerotic lesion in the fibula has well-circumscribed margins and is favored to be benign in the absence of other known osseous metastases. No discernible soft tissue abnormality. IMPRESSION: Trace knee joint effusion. No fracture or dislocation. Electronically Signed   By: Marin Roberts M.D.   On: 03/01/2022 08:21   DG Knee 2 Views Right  Result Date: 03/01/2022 CLINICAL DATA:  Fall with patellar pain. EXAM: RIGHT KNEE - 1-2 VIEW COMPARISON:  None Available. FINDINGS: No evidence of regional fracture. No weight-bearing compartment degenerative disease. 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 No other finding of note. IMPRESSION: No acute or traumatic finding. 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 Electronically Signed   By: Nelson Chimes M.D.   On: 03/01/2022 08:18   CT Head Wo Contrast  Result Date: 02/28/2022 CLINICAL DATA:  Head trauma, moderate to severe. [place] getting out of bed with laceration to left eyebrow. EXAM: CT HEAD WITHOUT CONTRAST TECHNIQUE: Contiguous axial images were obtained from the base of the skull through the vertex without intravenous contrast. RADIATION DOSE REDUCTION: This exam was performed according to the departmental dose-optimization program which includes automated exposure control, adjustment of the mA and/or kV according to patient size and/or use of iterative reconstruction technique. COMPARISON:  None. FINDINGS: Brain: No acute intracranial hemorrhage, midline shift or mass effect. No extra-axial fluid collection. Diffuse atrophy is noted. Periventricular white matter hypodensities are noted bilaterally. No hydrocephalus. Vascular: There is atherosclerotic calcification of the carotid siphons. No hyperdense vessel. Skull:  Normal. Negative for fracture or focal lesion. Sinuses/Orbits: No acute finding. Other: None. IMPRESSION: 1. No acute intracranial process. 2. Atrophy with chronic microvascular ischemic changes. Electronically Signed   By: Brett Fairy M.D.   On: 02/28/2022 03:40   CT Angio Chest PE W and/or Wo Contrast  Result Date: 02/14/2022 CLINICAL DATA:  Intermittent chest pain x3 days. EXAM: CT ANGIOGRAPHY CHEST WITH CONTRAST TECHNIQUE: Multidetector CT imaging of the chest was performed using the standard protocol during bolus administration of intravenous contrast. Multiplanar CT image reconstructions and MIPs were obtained to evaluate the vascular anatomy. RADIATION DOSE REDUCTION: This exam was performed according to the departmental dose-optimization program which includes automated exposure control, adjustment of the  mA and/or kV according to patient size and/or use of iterative reconstruction technique. CONTRAST:  75m OMNIPAQUE IOHEXOL 350 MG/ML SOLN COMPARISON:  January 31, 2022 FINDINGS: Cardiovascular: A left-sided venous Port-A-Cath is noted. There is mild calcification of the aortic arch. The thoracic aorta is otherwise normal in appearance. Satisfactory opacification of the pulmonary arteries to the segmental level. No evidence of pulmonary embolism. Normal heart size with mild coronary artery calcification. No pericardial effusion. Mediastinum/Nodes: No enlarged mediastinal, hilar, or axillary lymph nodes. A dilated and debris filled proximal and mid esophagus is again seen. The distal esophageal mass noted on the prior exam is poorly visualized on the current study. The thyroid gland and trachea demonstrate no significant findings. Lungs/Pleura: Mild lingular, right upper lobe, right middle lobe and right lower lobe tree-in-bud infiltrates are seen. Mild posterior left lower lobe tree-in-bud infiltrate is also noted. These areas are decreased in severity when compared to the prior study. A 17 mm x 11 mm  area of prominent bronchiectasis is noted within the posteromedial aspect of the right lower lobe, along an area of central peribronchovascular consolidation seen on the prior exam (axial CT images 58 through 64, CT series 7). A 6 mm area of linear scarring is seen within the posterior aspect of the right lower lobe (axial CT image 53, CT series 7). An 11 mm thin walled cavitary lesion is noted within this region on the prior study. There is no evidence of a pleural effusion or pneumothorax. Upper Abdomen: A percutaneous gastrostomy tube is in place. Musculoskeletal: Numerous chronic bilateral rib fractures are again seen. Stable chronic compression fracture deformities are seen within the mid and upper thoracic spine. Review of the MIP images confirms the above findings. IMPRESSION: 1. No evidence of pulmonary embolism. 2. Mild bilateral tree-in-bud infiltrates, decreased in severity when compared to the prior study. 3. Dilated and debris filled proximal and mid esophagus without clear visualization of the distal esophageal mass noted on the prior exam. 4. Numerous chronic bilateral rib fractures and chronic compression fracture deformities within the mid and upper thoracic spine. 5. Percutaneous gastrostomy tube. 6. Aortic atherosclerosis. Aortic Atherosclerosis (ICD10-I70.0). Electronically Signed   By: TVirgina NorfolkM.D.   On: 02/14/2022 19:11   DG Chest 2 View  Result Date: 02/14/2022 CLINICAL DATA:  Chest pain EXAM: CHEST - 2 VIEW COMPARISON:  CT 01/31/2022 FINDINGS: Left chest wall port a catheter is noted with tip in the distal SVC. Heart size appears normal. No pleural effusion or edema. No airspace opacities identified. Unchanged remote healed rib fractures. Remote inferior endplate deformity involving the T7 vertebra appears stable. IMPRESSION: No acute cardiopulmonary abnormalities. Electronically Signed   By: TKerby MoorsM.D.   On: 02/14/2022 12:16    DOrson Eva DO  [facility]  If 7PM-7AM, please contact night-coverage [email or web address] Password [email or web address] 03/11/2022, 11:45 AM   LOS: 9 days

## 2022-03-11 NOTE — Progress Notes (Signed)
Patient refused CHG bath and to have any of his bed linens changed by nurse tech this am.

## 2022-03-11 NOTE — TOC Progression Note (Signed)
Transition of Care [facility]) - Progression Note    Patient Details  Name: Trevor [name] MRN: [id number] Date of Birth: 24-May-1970  [facility] CM/SW Contact  Joanne Chars, LCSW Phone Number: 03/11/2022, 11:40 AM  Clinical Narrative:   Several attempts to reach [facility].  CSW finally received call from [place] at [facility].  Jackelyn Poling is off this weekend, no admissions coverage.  They are not able to accept anyone over the weekend, will need to contact [employer] on Monday.     Expected Discharge Plan: [place] Barriers to Discharge: Insurance Authorization  Expected Discharge Plan and Services Expected Discharge Plan: [place] In-house Referral: Clinical Social Work Discharge Planning Services: CM Consult Post Acute Care Choice: [place] Living arrangements for the past 2 months: Apartment Expected Discharge Date: 03/03/22                                     Social Determinants of Health (SDOH) Interventions Housing Interventions: Intervention Not Indicated  Readmission Risk Interventions    03/03/2022   11:46 AM 01/24/2022    3:13 PM  Readmission Risk Prevention Plan  Transportation Screening Complete Complete  PCP or Specialist Appt within 3-5 Days  Complete  HRI or [place]  Complete  Social Work Consult for [place] Planning/Counseling  Complete  Palliative Care Screening  Complete  Medication Review Press photographer) Complete Complete  HRI or [place] Complete   SW Recovery Care/Counseling Consult Complete   Palliative Care Screening Complete   [place] Patient Refused

## 2022-03-12 DIAGNOSIS — E876 Hypokalemia: Secondary | ICD-10-CM | POA: Diagnosis not present

## 2022-03-12 DIAGNOSIS — R627 Adult failure to thrive: Secondary | ICD-10-CM | POA: Diagnosis not present

## 2022-03-12 DIAGNOSIS — N179 Acute kidney failure, unspecified: Secondary | ICD-10-CM | POA: Diagnosis not present

## 2022-03-12 DIAGNOSIS — E870 Hyperosmolality and hypernatremia: Secondary | ICD-10-CM | POA: Diagnosis not present

## 2022-03-12 LAB — BASIC METABOLIC PANEL
Anion gap: 4 — ABNORMAL LOW (ref 5–15)
BUN: 27 mg/dL — ABNORMAL HIGH (ref 6–20)
CO2: 21 mmol/L — ABNORMAL LOW (ref 22–32)
Calcium: 8.6 mg/dL — ABNORMAL LOW (ref 8.9–10.3)
Chloride: 116 mmol/L — ABNORMAL HIGH (ref 98–111)
Creatinine, Ser: 0.62 mg/dL (ref 0.61–1.24)
GFR, Estimated: >60 mL/min (ref >60)
Glucose, Bld: 92 mg/dL (ref 70–99)
Potassium: 3.4 mmol/L — ABNORMAL LOW (ref 3.5–5.1)
Sodium: 141 mmol/L (ref 135–145)

## 2022-03-12 LAB — MAGNESIUM: Magnesium: 1.9 mg/dL (ref 1.7–2.4)

## 2022-03-12 LAB — PHOSPHORUS: Phosphorus: 2.2 mg/dL — ABNORMAL LOW (ref 2.5–4.6)

## 2022-03-12 MED ORDER — VITAL AF 1.2 CAL PO LIQD: 1000.0000 mL | ORAL | Status: DC

## 2022-03-12 MED ORDER — FOLIC ACID 1 MG PO TABS: 1.0000 mg | ORAL_TABLET | Freq: Every day | ORAL | Status: AC

## 2022-03-12 MED ORDER — K PHOS MONO-SOD PHOS DI & MONO 155-852-130 MG PO TABS
500.0000 mg | ORAL_TABLET | Freq: Two times a day (BID) | ORAL | Status: DC
  Administered 2022-03-12 – 2022-03-14 (×4): 500 mg
  Filled 2022-03-12 (×4): qty 2

## 2022-03-12 MED ORDER — NICOTINE 14 MG/24HR TD PT24: 14.0000 mg | MEDICATED_PATCH | Freq: Every day | TRANSDERMAL | 0 refills | Status: AC | PRN

## 2022-03-12 MED ORDER — MIDODRINE HCL 5 MG PO TABS: 5.0000 mg | ORAL_TABLET | Freq: Three times a day (TID) | ORAL | Status: AC

## 2022-03-12 MED ORDER — SODIUM CHLORIDE 0.45 % IV BOLUS
1000.0000 mL | Freq: Once | INTRAVENOUS | Status: AC
  Administered 2022-03-12: 1000 mL via INTRAVENOUS

## 2022-03-12 NOTE — Discharge Summary (Incomplete)
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 He has frequent falls at home worse in last couple of days. He is dizzy all the time.  He is not doing water flushes in PEG and continues to smoke.  He refuses SNF.  His chemotherapy was placed on hold due to severe weight loss.  He does not accept that he is dying. He wants to remain full code.  He demands to be admitted to [facility], adamantly refusing to stay at [facility].  He was noted to have an AKI with severe hypernatremia consistent with worsening volume and nutritional status.  He was agreeable to IV fluids overnight but wants to be discharged home tomorrow.   He again refuses to be admitted to [facility].   As the hospitalization progress,  he ultimately agreed to stay at [facility] after he came to the realization that he would not receive any different care at [facility].  He was started on hypotonic fluid  and free water flushes with improvement in his hypernatremia.   His hospitalization was prolonged due to G-tube malfunction requiring replacement and then dislodgement of his Gtube.  This required IR to place new G-tube.  Subsequently, delay in insurance authorization resulted in further delay.  Assessment and Plan: Hypernatremia / Severe Dehydration  - He has severe free water deficiency - Receiving D5W infusion (35 ml/hr) and free water (100 ml  q4h) -Sodium normalized after supplementing adequate free water and IVF -given 1/2NS boluses also -   Hypotension -pt has had soft BPs for most of his hospitalization -check lactic 1.2 -check PCT 0.13 -rebolused LR and 1/2 NS -started midodrine -am cortisol 4.0 -11/15 1345--97.4--110--16--87/62--99% on 2L -11/16 0800--98.8--98--16--91/70--100% 2L -11/18--BP overall improved with midodrine   G-tube Malfunction -G tube has a hole>>leaking -reconsult general surgery>>placed new Gtube 11/15 -early AM 03/09/22--Gtube "fell out" -discussed with Dr. Morene Rankins meatus closed>>consult IR to place new PEG via old tract -11/17--new G-tube placed by IR>>restart enteral feeding -goal rate of Vital 1.2 = 60 cc/hr   Hypokalemia Secondary to poor oral intake. Patient states that the Osmolite which was prescribed last month gives him "terrible diarrhea" so he has not been compliant with it. Dietician consult placed and patient will be started on  - Magnesium is normal - replace potassium regularly (he is supposed to be taking daily liquid potassium) - repleted    Stage IV B Metastatic Squamous Cell Cancer - his chemotherapy was placed on temporary hold due to severe weight loss - he is followed by Dr. Delton Coombes - palliative care met with him during last admission and he remained in denial about the severity of illness and wanted to be full code/full scope care  - follow up with Dr. Delton Coombes for ongoing discussion   Frequent Falls - likely exacerbated by severe debility, malnutrition, weight loss - fall precautions recommended - PT ordered recommending SNF    Failure to thrive  -  Pt seems unable to care for self at home - PT>>SNF   Severe protein calorie malnutrition - resume regular diet plus PEG tube feeds  - Phosphorous nadir 1.4 - monitor for refeeding syndrome, recheck Mg, Phos in AM, replete as needed    Refeeding Syndrome - replacing phosphorus and Mg as needed     Hypophosphatemia - IV replacement and PEG replacement ordered    AKI Resolved - prerenal from severe dehydration  -serum creatinine peaked 1.32 -resolved   Tobacco  - nicotine patch ordered - counseled patient on tobacco cessation    Chronic nausea and vomiting - secondary to esophageal malignancy - treat supportively - pt insists upon po intake, will not follow medical advice   History of cavitary pneumonia - completed a 4 week course of augmentin  - now stable on RA without leukocytosis   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  - gabapentin    Leukocytosis  - pt completed 4 week course of augmentin for cavitary pneumonia treatment -now due to stress demargination -afebrile and resolved with tx of acute medical issues    {Tip this will not be part of the note when signed Body mass index is 14.66 kg/m. ,  Nutrition Documentation    [place] [facility] to Hosp-Admission (Current) from 03/01/2022 in [facility]  Nutrition Problem Severe Malnutrition  Etiology chronic illness  [esophageal cancer- Stage IV]  Nutrition Goal Patient will meet greater than or equal to 90% of their needs  Interventions Tube feeding     ,  (Optional):26781}  {(NOTE) Pain control PDMP Statment (Optional):26782} Consultants: general surgery, IR, palliative medicine Procedures performed: none  Disposition: SNF Diet recommendation:  Vital 1.2 @ 60 cc/ hr DISCHARGE MEDICATION: Allergies as of 03/12/2022       Reactions   Pork-derived Products Other (See Comments)   Patient does not eat pork due to his religious belief     Med Rec must be completed prior to using this [place]***       Contact information for after-discharge care     [facility] .   Service: Skilled Nursing Contact information: [address] [place] [phone]                    Discharge  Exam: Danley Danker Weights   03/10/22 0500 03/11/22 0544 03/12/22 0439  Weight: 39.4 kg 40 kg 41.2 kg   ***  Condition at discharge: {DC Condition:26389}  The results of significant diagnostics from this hospitalization (including imaging, microbiology, ancillary and laboratory) are listed below for reference.   Imaging Studies: IR Replc Gastro/Colonic Tube Percut W/Fluoro  Result Date: 03/10/2022 INDICATION: 51 year old male with a history of surgically placed gastrostomy tube which was inadvertently displaced. Tube could not be replaced at bedside at outside hospital so patient now presents [facility] interventional [facility]ology for tube replacement. EXAM: GASTROSTOMY CATHETER REPLACEMENT MEDICATIONS: Ancef 2 g ANESTHESIA/SEDATION: Versed 1 mg IV; Fentanyl 25 mcg IV Moderate Sedation Time:  9 minutes The patient's vital signs and level of consciousness were continuously monitored during the procedure by the interventional radiology nurse under my direct supervision. CONTRAST:  49m OMNIPAQUE IOHEXOL 300 MG/ML SOLN - administered into the gastric lumen. FLUOROSCOPY TIME:  Radiation exposure index: 1 mGy reference air kerma COMPLICATIONS: None immediate. PROCEDURE: Informed written consent was obtained from the patient after a thorough discussion of the procedural risks, benefits and alternatives. All questions  were addressed. Maximal Sterile Barrier Technique was utilized including caps, mask, sterile gowns, sterile gloves, sterile drape, hand hygiene and skin antiseptic. A timeout was performed prior to the initiation of the procedure. The prior gastrostomy tube ostomy was anesthetized by infiltration with 1% lidocaine. Using a hemostat, the overlying scab was debrided. The hemostat was then used to gently open the ostomy tract. A 5 [place] Kumpe the catheter was navigated through the ostomy and into the stomach. Contrast was injected confirming that the catheter is indeed within the gastric fundus. A wire  was placed. The existing ostomy tract was then dilated to 20 [place]. An into a 27 French percutaneous gastrostomy tube was lubricated and advanced over the wire into the stomach. The retention balloon was filled with 15 mL saline and pulled snug against the anterior abdominal wall. The external bumper was fixed in place. Contrast was injected through the tube confirming its location within the stomach. The tube was then flushed with saline and capped. The patient tolerated the procedure well. IMPRESSION: Successful replacement of 20 French percutaneous gastrostomy tube. Electronically Signed   By: Jacqulynn Cadet M.D.   On: 03/10/2022 10:10   DG Chest Portable 1 View  Result Date: 03/02/2022 CLINICAL DATA:  Issues with port EXAM: PORTABLE CHEST 1 VIEW COMPARISON:  02/14/2022, CT 02/14/2022 FINDINGS: Left-sided central venous port tip over the SVC. Tubing appears grossly continuous. No pleural effusion or pneumothorax. Stable cardiomediastinal silhouette. Probable skin fold artifact over the left chest. Multiple old bilateral rib fractures. Mild nodularity in the right upper lobe, likely corresponds to tree-in-bud density on the prior CT. IMPRESSION: 1. Left-sided central venous port tip over the SVC. Port tubing appears grossly continuous. 2. Mild nodularity in the right upper lobe adjacent to the fissure, likely corresponds to tree-in-bud density on prior CT. Electronically Signed   By: Donavan Foil M.D.   On: 03/02/2022 23:43   CT HEAD WO CONTRAST (5MM)  Result Date: 03/02/2022 CLINICAL DATA:  Head trauma. EXAM: CT HEAD WITHOUT CONTRAST TECHNIQUE: Contiguous axial images were obtained from the base of the skull through the vertex without intravenous contrast. RADIATION DOSE REDUCTION: This exam was performed according to the departmental dose-optimization program which includes automated exposure control, adjustment of the mA and/or kV according to patient size and/or use of iterative reconstruction  technique. COMPARISON:  CT Head 02/28/22 FINDINGS: Brain: No evidence of acute infarction, hemorrhage, hydrocephalus, extra-axial collection or mass lesion/mass effect. Sequela of mild chronic microvascular ischemic change. Vascular: No hyperdense vessel or unexpected calcification. Skull: Normal. Negative for fracture or focal lesion. Sinuses/Orbits: No acute finding. Other: None IMPRESSION: No acute intracranial abnormality. Electronically Signed   By: Marin Roberts M.D.   On: 03/02/2022 19:51   DG Knee 2 Views Left  Result Date: 03/01/2022 CLINICAL DATA:  Trauma EXAM: LEFT KNEE - 1-2 VIEW COMPARISON:  None Available. FINDINGS: Trace knee joint effusion. No fracture. No dislocation. Mild enthesopathic changes of the patella. Sclerotic lesion in the fibula has well-circumscribed margins and is favored to be benign in the absence of other known osseous metastases. No discernible soft tissue abnormality. IMPRESSION: Trace knee joint effusion. No fracture or dislocation. Electronically Signed   By: Marin Roberts M.D.   On: 03/01/2022 08:21   DG Knee 2 Views Right  Result Date: 03/01/2022 CLINICAL DATA:  Fall with patellar pain. EXAM: RIGHT KNEE - 1-2 VIEW COMPARISON:  None Available. FINDINGS: No evidence of regional fracture. No weight-bearing compartment degenerative disease. Probable chondromalacia of the patellofemoral  joint. No other finding of note. IMPRESSION: No acute or traumatic finding. 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 Electronically Signed   By: Nelson Chimes M.D.   On: 03/01/2022 08:18   CT Head Wo Contrast  Result Date: 02/28/2022 CLINICAL DATA:  Head trauma, moderate to severe. [place] getting out of bed with laceration to left eyebrow. EXAM: CT HEAD WITHOUT CONTRAST TECHNIQUE: Contiguous axial images were obtained from the base of the skull through the vertex without intravenous contrast. RADIATION DOSE REDUCTION: This exam was performed according to the departmental  dose-optimization program which includes automated exposure control, adjustment of the mA and/or kV according to patient size and/or use of iterative reconstruction technique. COMPARISON:  None. FINDINGS: Brain: No acute intracranial hemorrhage, midline shift or mass effect. No extra-axial fluid collection. Diffuse atrophy is noted. Periventricular white matter hypodensities are noted bilaterally. No hydrocephalus. Vascular: There is atherosclerotic calcification of the carotid siphons. No hyperdense vessel. Skull: Normal. Negative for fracture or focal lesion. Sinuses/Orbits: No acute finding. Other: None. IMPRESSION: 1. No acute intracranial process. 2. Atrophy with chronic microvascular ischemic changes. Electronically Signed   By: Brett Fairy M.D.   On: 02/28/2022 03:40   CT Angio Chest PE W and/or Wo Contrast  Result Date: 02/14/2022 CLINICAL DATA:  Intermittent chest pain x3 days. EXAM: CT ANGIOGRAPHY CHEST WITH CONTRAST TECHNIQUE: Multidetector CT imaging of the chest was performed using the standard protocol during bolus administration of intravenous contrast. Multiplanar CT image reconstructions and MIPs were obtained to evaluate the vascular anatomy. RADIATION DOSE REDUCTION: This exam was performed according to the departmental dose-optimization program which includes automated exposure control, adjustment of the mA and/or kV according to patient size and/or use of iterative reconstruction technique. CONTRAST:  62m OMNIPAQUE IOHEXOL 350 MG/ML SOLN COMPARISON:  January 31, 2022 FINDINGS: Cardiovascular: A left-sided venous Port-A-Cath is noted. There is mild calcification of the aortic arch. The thoracic aorta is otherwise normal in appearance. Satisfactory opacification of the pulmonary arteries to the segmental level. No evidence of pulmonary embolism. Normal heart size with mild coronary artery calcification. No pericardial effusion. Mediastinum/Nodes: No enlarged mediastinal, hilar, or axillary  lymph nodes. A dilated and debris filled proximal and mid esophagus is again seen. The distal esophageal mass noted on the prior exam is poorly visualized on the current study. The thyroid gland and trachea demonstrate no significant findings. Lungs/Pleura: Mild lingular, right upper lobe, right middle lobe and right lower lobe tree-in-bud infiltrates are seen. Mild posterior left lower lobe tree-in-bud infiltrate is also noted. These areas are decreased in severity when compared to the prior study. A 17 mm x 11 mm area of prominent bronchiectasis is noted within the posteromedial aspect of the right lower lobe, along an area of central peribronchovascular consolidation seen on the prior exam (axial CT images 58 through 64, CT series 7). A 6 mm area of linear scarring is seen within the posterior aspect of the right lower lobe (axial CT image 53, CT series 7). An 11 mm thin walled cavitary lesion is noted within this region on the prior study. There is no evidence of a pleural effusion or pneumothorax. Upper Abdomen: A percutaneous gastrostomy tube is in place. Musculoskeletal: Numerous chronic bilateral rib fractures are again seen. Stable chronic compression fracture deformities are seen within the mid and upper thoracic spine. Review of the MIP images confirms the above findings. IMPRESSION: 1. No evidence of pulmonary embolism. 2. Mild bilateral tree-in-bud infiltrates, decreased in severity when compared to the prior study.  3. Dilated and debris filled proximal and mid esophagus without clear visualization of the distal esophageal mass noted on the prior exam. 4. Numerous chronic bilateral rib fractures and chronic compression fracture deformities within the mid and upper thoracic spine. 5. Percutaneous gastrostomy tube. 6. Aortic atherosclerosis. Aortic Atherosclerosis (ICD10-I70.0). Electronically Signed   By: Virgina Norfolk M.D.   On: 02/14/2022 19:11   DG Chest 2 View  Result Date:  02/14/2022 CLINICAL DATA:  Chest pain EXAM: CHEST - 2 VIEW COMPARISON:  CT 01/31/2022 FINDINGS: Left chest wall port a catheter is noted with tip in the distal SVC. Heart size appears normal. No pleural effusion or edema. No airspace opacities identified. Unchanged remote healed rib fractures. Remote inferior endplate deformity involving the T7 vertebra appears stable. IMPRESSION: No acute cardiopulmonary abnormalities. Electronically Signed   By: Kerby Moors M.D.   On: 02/14/2022 12:16    Microbiology: Results for orders placed or performed during the hospital encounter of 01/21/22  MRSA Next Gen by PCR, Nasal     Status: None   Collection Time: 01/21/22 12:32 PM   Specimen: Nasal Mucosa; Nasal Swab  Result Value Ref Range Status   MRSA by PCR Next Gen NOT DETECTED NOT DETECTED Final    Comment: (NOTE) The GeneXpert MRSA Assay (FDA approved for NASAL specimens only), is one component of a comprehensive MRSA colonization surveillance program. It is not intended to diagnose MRSA infection nor to guide or monitor treatment for MRSA infections. Test performance is not FDA approved in patients less than 45 years old. Performed at [facility], [address], [place], [place] [address]   Culture, blood (Routine X 2) w Reflex to ID Panel     Status: None   Collection Time: 01/21/22  1:17 PM   Specimen: BLOOD  Result Value Ref Range Status   Specimen Description BLOOD  Final   Special Requests BLOOD  Final   Culture   Final    NO GROWTH 5 DAYS Performed at [facility], [address], [place], [place] [address]    Report Status 01/26/2022 FINAL  Final  Culture, blood (Routine X 2) w Reflex to ID Panel     Status: None   Collection Time: 01/21/22  1:17 PM   Specimen: BLOOD LEFT FOREARM  Result Value Ref Range Status   Specimen Description   Final    BLOOD LEFT FOREARM BOTTLES DRAWN AEROBIC AND ANAEROBIC   Special Requests Blood Culture adequate volume  Final   Culture   Final    NO  GROWTH 5 DAYS Performed at [facility], [address], [place], [place] [address]    Report Status 01/26/2022 FINAL  Final  SARS Coronavirus 2 by RT PCR (hospital order, performed in [facility] hospital lab) *cepheid single result test* Anterior Nasal Swab     Status: None   Collection Time: 01/22/22  7:08 PM   Specimen: Anterior Nasal Swab  Result Value Ref Range Status   SARS Coronavirus 2 by RT PCR NEGATIVE NEGATIVE Final    Comment: (NOTE) SARS-CoV-2 target nucleic acids are NOT DETECTED.  The SARS-CoV-2 RNA is generally detectable in upper and lower respiratory specimens during the acute phase of infection. The lowest concentration of SARS-CoV-2 viral copies this assay can detect is 250 copies / mL. A negative result does not preclude SARS-CoV-2 infection and should not be used as the sole basis for treatment or other patient management decisions.  A negative result may occur with improper specimen collection /  handling, submission of specimen other than nasopharyngeal swab, presence of viral mutation(s) within the areas targeted by this assay, and inadequate number of viral copies (<250 copies / mL). A negative result must be combined with clinical observations, patient history, and epidemiological information.  Fact Sheet for Patients:   https://www.patel.info/  Fact Sheet for Healthcare Providers: https://hall.com/  This test is not yet approved or  cleared by the [place] FDA and has been authorized for detection and/or diagnosis of SARS-CoV-2 by FDA under an Emergency Use Authorization (EUA).  This EUA will remain in effect (meaning this test can be used) for the duration of the COVID-19 declaration under Section 564(b)(1) of the Act, 21 U.S.C. section 360bbb-3(b)(1), unless the authorization is terminated or revoked sooner.  Performed at [facility], [place] [address], [place], [place] [address]   Culture, fungus  without smear     Status: None   Collection Time: 01/23/22  9:51 AM   Specimen: PATH Cytology FNA; Lung  Result Value Ref Range Status   Specimen Description OTHER  Final   Special Requests  RLL NEEDLE ASPIRATION  Final   Culture   Final    NO FUNGUS ISOLATED AFTER 21 DAYS Performed at [facility], [address] [place], [place] [address]    Report Status 02/13/2022 FINAL  Final  Aerobic/Anaerobic Culture w Gram Stain (surgical/deep wound)     Status: None   Collection Time: 01/23/22  9:51 AM   Specimen: PATH Cytology FNA; Lung  Result Value Ref Range Status   Specimen Description OTHER  Final   Special Requests  RLL NEEDLE APSIRATION  Final   Gram Stain   Final    FEW WBC PRESENT,BOTH PMN AND MONONUCLEAR NO ORGANISMS SEEN    Culture   Final    RARE STREPTOCOCCUS CONSTELLATUS Beta hemolytic streptococci are predictably susceptible to penicillin and other beta lactams. Susceptibility testing not routinely performed. NO ANAEROBES ISOLATED Performed at [facility], [place] [address] [place], [place] [address]    Report Status 01/28/2022 FINAL  Final  Acid Fast Culture with reflexed sensitivities     Status: None   Collection Time: 01/23/22  9:51 AM   Specimen: PATH Cytology FNA; Lung  Result Value Ref Range Status   Acid Fast Culture Negative  Final    Comment: (NOTE) No acid fast bacilli isolated after 6 weeks. Performed At: [facility] [place], [place] [id number] Rush Farmer MD [id number]    Source of Sample OTHER  Final    Comment:  RLL NEEDLE APIRATION Performed at [facility], [place] [address], [place], [place] [address]   Acid Fast Smear (AFB)     Status: None   Collection Time: 01/23/22  9:51 AM   Specimen: PATH Cytology FNA; Lung  Result Value Ref Range Status   AFB Specimen Processing Concentration  Final   Acid Fast Smear Negative  Final    Comment: (NOTE) Performed At: [facility] [place], [place] [id number] Rush Farmer MD [id number]    Source (AFB) OTHER  Final    Comment: Performed at [facility], [place] [address] [place], [place] [address]  Fungus Culture With Stain     Status: None   Collection Time: 01/23/22 10:02 AM   Specimen: Bronchial Alveolar Lavage; Respiratory  Result Value Ref Range Status   Fungus Stain Final report  Final   Fungus (Mycology) Culture Final report  Final  Comment: (NOTE) Performed At: [facility] [address] [place], [place] [id number] Rush Farmer MD [id number]    Fungal Source BRONCHIAL ALVEOLAR LAVAGE  Final    Comment: Performed at [facility], [place] [address], [place], [place] [address]  Culture, BAL-quantitative w Gram Stain     Status: None   Collection Time: 01/23/22 10:02 AM   Specimen: Bronchial Alveolar Lavage; Respiratory  Result Value Ref Range Status   Specimen Description BRONCHIAL ALVEOLAR LAVAGE  Final   Special Requests NONE  Final   Gram Stain   Final    MODERATE WBC PRESENT, PREDOMINANTLY MONONUCLEAR NO ORGANISMS SEEN    Culture   Final    NO GROWTH 2 DAYS Performed at [facility], [address] [place], [place] [address]    Report Status 01/25/2022 FINAL  Final  Aerobic/Anaerobic Culture w Gram Stain (surgical/deep wound)     Status: None   Collection Time: 01/23/22 10:02 AM   Specimen: Bronchial Alveolar Lavage; Respiratory  Result Value Ref Range Status   Specimen Description BRONCHIAL ALVEOLAR LAVAGE  Final   Special Requests NONE  Final   Gram Stain   Final    FEW WBC PRESENT,BOTH PMN AND MONONUCLEAR NO ORGANISMS SEEN    Culture   Final    RARE STREPTOCOCCUS CONSTELLATUS Beta hemolytic streptococci are predictably susceptible to penicillin and other beta lactams. Susceptibility testing not routinely performed. NO ANAEROBES ISOLATED Performed at [facility], [place] [address], [place], [place] [address]    Report Status 01/28/2022 FINAL  Final   Acid Fast Culture with reflexed sensitivities     Status: None   Collection Time: 01/23/22 10:02 AM   Specimen: Bronchial Alveolar Lavage; Respiratory  Result Value Ref Range Status   Acid Fast Culture Negative  Final    Comment: (NOTE) No acid fast bacilli isolated after 6 weeks. Performed At: [facility] [place], [place] [id number] Rush Farmer MD [id number]    Source of Sample BRONCHIAL ALVEOLAR LAVAGE  Final    Comment: Performed at [facility], [place] [address], [place], [place] [address]  Acid Fast Smear (AFB)     Status: None   Collection Time: 01/23/22 10:02 AM   Specimen: Bronchial Alveolar Lavage; Respiratory  Result Value Ref Range Status   AFB Specimen Processing Concentration  Final   Acid Fast Smear Negative  Final    Comment: (NOTE) Performed At: [facility] [place], [place] [id number] Rush Farmer MD WY:[id number]    Source (AFB) BRONCHIAL ALVEOLAR LAVAGE  Final    Comment: Performed at [facility], [place] [address], [place], [place] [address]  Fungus Culture Result     Status: None   Collection Time: 01/23/22 10:02 AM  Result Value Ref Range Status   Result 1 Comment  Final    Comment: (NOTE) KOH/Calcofluor preparation:  no fungus observed. Performed At: [facility] [place], [place] [id number] Rush Farmer MD NO:[id number]   Fungal organism reflex     Status: None   Collection Time: 01/23/22 10:02 AM  Result Value Ref Range Status   Fungal result 1 Comment  Final    Comment: (NOTE) No yeast or mold isolated after 4 weeks. Performed At: [facility] [place], [place] [id number] Rush Farmer MD PT:[id number]   Fungus Culture With Stain     Status: None   Collection Time: 01/23/22 10:31 AM   Specimen:  Bronchial Alveolar Lavage; Respiratory  Result Value Ref Range Status   Fungus Stain Final report  Final   Fungus (Mycology) Culture Final  report  Final    Comment: (NOTE) Performed At: [facility] [address] [place], [place] [id number] Rush Farmer MD WI:[id number]    Fungal Source BRONCHIAL ALVEOLAR LAVAGE  Final    Comment: Performed at [facility], [place] [address], [place], [place] [address]  Culture, BAL-quantitative w Gram Stain     Status: None   Collection Time: 01/23/22 10:31 AM   Specimen: Bronchial Alveolar Lavage; Respiratory  Result Value Ref Range Status   Specimen Description BRONCHIAL ALVEOLAR LAVAGE  Final   Special Requests  RLL  Final   Gram Stain   Final    RARE WBC PRESENT,BOTH PMN AND MONONUCLEAR NO ORGANISMS SEEN    Culture   Final    NO GROWTH 2 DAYS Performed at [facility], [address] [place], [place] [address]    Report Status 01/25/2022 FINAL  Final  Aerobic/Anaerobic Culture w Gram Stain (surgical/deep wound)     Status: None   Collection Time: 01/23/22 10:31 AM   Specimen: Bronchial Alveolar Lavage; Respiratory  Result Value Ref Range Status   Specimen Description BRONCHIAL ALVEOLAR LAVAGE  Final   Special Requests  RLL  Final   Gram Stain   Final    FEW WBC PRESENT, PREDOMINANTLY MONONUCLEAR NO ORGANISMS SEEN    Culture   Final    RARE STREPTOCOCCUS CONSTELLATUS Beta hemolytic streptococci are predictably susceptible to penicillin and other beta lactams. Susceptibility testing not routinely performed. NO ANAEROBES ISOLATED Performed at [facility], [place] [address], [place], [place] [address]    Report Status 01/28/2022 FINAL  Final  Acid Fast Culture with reflexed sensitivities     Status: None   Collection Time: 01/23/22 10:31 AM   Specimen: Bronchial Alveolar Lavage; Respiratory  Result Value Ref Range Status   Acid Fast Culture Negative  Final    Comment: (NOTE) No acid fast bacilli isolated after 6 weeks. Performed At: [facility] [place], [place] [id number] Rush Farmer MD WU:[id number]    Source of  Sample BRONCHIAL ALVEOLAR LAVAGE  Final    Comment:  RLL Performed at [facility], [place] [address] [place], [place] [address]   Acid Fast Smear (AFB)     Status: None   Collection Time: 01/23/22 10:31 AM   Specimen: Bronchial Alveolar Lavage; Respiratory  Result Value Ref Range Status   AFB Specimen Processing Concentration  Final   Acid Fast Smear Negative  Final    Comment: (NOTE) Performed At: [facility] [place], [place] [id number] Rush Farmer MD TA:[id number]    Source (AFB) BRONCHIAL ALVEOLAR LAVAGE  Final    Comment: Performed at [facility], [place] [address] [place], [place] [address]  Fungus Culture Result     Status: None   Collection Time: 01/23/22 10:31 AM  Result Value Ref Range Status   Result 1 Comment  Final    Comment: (NOTE) KOH/Calcofluor preparation:  no fungus observed. Performed At: [facility] [place], [place] [id number] Rush Farmer MD QG:[id number]   Fungal organism reflex     Status: None   Collection Time: 01/23/22 10:31 AM  Result Value Ref Range Status   Fungal result 1 Comment  Final    Comment: (NOTE) No yeast or mold isolated after 4 weeks. Performed  At: [facility] [place], [place] [id number] Rush Farmer MD IH:[id number]     Labs: CBC: Recent Labs  Lab 03/09/22 0445 03/11/22 0527  WBC 5.7 8.7  HGB 7.6* 8.7*  HCT 23.8* 27.5*  MCV 81.2 83.3  PLT 257 756*   Basic Metabolic Panel: Recent Labs  Lab 03/06/22 0456 03/07/22 0437 03/09/22 0445 03/11/22 0527 03/12/22 0529  NA 137 136 135 144 141  K 4.8 4.1 3.9 3.0* 3.4*  CL 114* 109 110 116* 116*  CO2 20* 20* 22 22 21*  GLUCOSE 109* 152* 87 102* 92  BUN 13 19 23* 28* 27*  CREATININE 0.69 0.68 0.64 0.70 0.62  CALCIUM 8.4* 8.4* 7.8* 9.0 8.6*  MG 2.1 2.0 1.8 2.0 1.9  PHOS 1.3* 2.5 1.4* 2.8 2.2*   Liver Function Tests: No results for input(s): "AST", "ALT", "ALKPHOS", "BILITOT", "PROT",  "ALBUMIN" in the last 168 hours. CBG: Recent Labs  Lab 03/10/22 0740 03/10/22 1650  GLUCAP 70 99    Discharge time spent: {LESS THAN/GREATER THAN:26388} 30 minutes.  Signed: Orson Eva, MD [facility] 03/12/2022

## 2022-03-12 NOTE — Progress Notes (Signed)
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 He has frequent falls at home worse in last couple of days. He is dizzy all the time.  He is not doing water flushes in PEG and continues to smoke.  He refuses SNF.  His chemotherapy was placed on hold due to severe weight loss.  He does not accept that he is dying. He wants to remain full code.  He demands to be admitted to [facility], adamantly refusing to stay at [facility].  He was noted to have an AKI with severe hypernatremia consistent with worsening volume and nutritional status.  He was agreeable to IV fluids overnight but wants to be discharged home tomorrow.   He again refuses to be admitted to [facility].   As the hospitalization progress,  he ultimately agreed to stay at [facility] after he came to the realization that he would not receive any different care at [facility].  He was started on hypotonic fluid  and free water flushes with improvement in his hypernatremia.     Assessment/Plan: Hypernatremia / Severe Dehydration  - He has severe free water deficiency - Receiving D5W infusion (35 ml/hr) and free water (100 ml q4h) -Sodium normalized after supplementing adequate free water and IVF -given 1/2NS boluses also   Hypotension -pt has had soft BPs for most of his hospitalization -check lactic 1.2 -check PCT 0.13 -rebolused LR and 1/2 NS -started midodrine -am cortisol 4.0 -11/15 1345--97.4--110--16--87/62--99% on 2L -11/16 0800--98.8--98--16--91/70--100% 2L -11/18--BP overall improved with midodrine   G-tube Malfunction -G tube has a hole>>leaking -reconsult general  surgery>>placed new Gtube 11/15 -early AM 03/09/22--Gtube "fell out" -discussed with Dr. Morene Rankins meatus closed>>consult IR to place new PEG via old tract -11/17--new G-tube placed by IR>>restart enteral feeding -goal rate of Vital 1.2 = 60 cc/hr   Hypokalemia Secondary to poor oral intake. Patient states that the Osmolite which was prescribed last month gives him "terrible diarrhea" so he has not been compliant with it. Dietician consult placed and patient will be started on  - Magnesium is normal - replace potassium regularly (he is supposed to be taking daily liquid potassium) - repleted    Stage IV B Metastatic Squamous Cell Cancer - his chemotherapy was placed on temporary hold due to severe weight loss - he is followed by Dr. Delton Coombes - palliative care met with him during last admission and he remained in denial about the severity of illness and wanted to be full code/full scope care  - follow up with Dr. Delton Coombes for ongoing discussion   Frequent Falls - likely exacerbated by severe debility, malnutrition, weight loss - fall precautions recommended - PT ordered recommending SNF    Failure to thrive  - Pt seems unable to care for self at home - PT>>SNF   Severe protein calorie malnutrition - resume regular diet plus PEG tube feeds  - Phosphorous nadir 1.4 - monitor for refeeding syndrome, recheck Mg, Phos in AM, replete as needed    Refeeding Syndrome - replacing phosphorus and Mg as needed    Hypophosphatemia - IV replacement and PEG replacement ordered    AKI Resolved - prerenal from severe dehydration  -  serum creatinine peaked 1.32   Tobacco  - nicotine patch ordered - counseled patient on tobacco cessation    Chronic nausea and vomiting - secondary to esophageal malignancy - treat supportively - pt insists upon po intake, will not follow medical advice   History of cavitary pneumonia - completed a 4 week course of augmentin  - now stable on  RA without leukocytosis   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  - gabapentin    Leukocytosis  - pt completed 4 week course of augmentin for cavitary pneumonia treatment   DVT prophylaxis: refuses injectables -->rivaroxaban Code Status: full  Family Communication: none avail Disposition: anticipating SNF [facility]) when bed ready and insurance authorized Remains inpatient appropriate because: IV fluids and electrolytes               Subjective: Patient  continues to eat despite advice to avoid which has resulted in n/v.  Denies abd pain, f/c, hematemesis, cp, sob.  Objective: Vitals:   03/11/22 2042 03/12/22 0438 03/12/22 0439 03/12/22 1504  BP: (!) 112/95 97/67  103/74  Pulse: 67 90  87  Resp: _0 Temp: 98 F (36.7 C) 98.3 F (36.8 C)  98.5 F (36.9 C)  TempSrc: Oral Oral  Oral  SpO2: 100% 100%  100%  Weight:   41.2 kg   Height:        Intake/Output Summary (Last 24 hours) at 03/12/2022 1600 Last data filed at 03/12/2022 0700 Gross per 24 hour  Intake 2150 ml  Output --  Net 2150 ml   Weight change: 1.2 kg Exam:  General:  Pt is alert, follows commands appropriately, not in acute distress HEENT: No icterus, No thrush, No neck mass, [place]/AT Cardiovascular: RRR, S1/S2, no rubs, no gallops Respiratory: bibasilar rales. No wheeze Abdomen: Soft/+BS, non tender, non distended, no guarding Extremities: No edema, No lymphangitis, No petechiae, No rashes, no synovitis   Data Reviewed: I have personally reviewed following labs and imaging studies Basic Metabolic Panel: Recent Labs  Lab 03/06/22 0456 03/07/22 0437 03/09/22 0445 03/11/22 0527 03/12/22 0529  NA 137 136 135 144 141  K 4.8 4.1 3.9 3.0* 3.4*  CL 114* 109 110 116* 116*  CO2 20* 20* 22 22 21*  GLUCOSE 109* 152* 87 102* 92  BUN 13 19 23* 28* 27*  CREATININE 0.69 0.68 0.64 0.70 0.62  CALCIUM 8.4* 8.4* 7.8* 9.0 8.6*  MG 2.1 2.0 1.8 2.0 1.9  PHOS 1.3* 2.5 1.4* 2.8  2.2*   Liver Function Tests: No results for input(s): "AST", "ALT", "ALKPHOS", "BILITOT", "PROT", "ALBUMIN" in the last 168 hours. No results for input(s): "LIPASE", "AMYLASE" in the last 168 hours. No results for input(s): "AMMONIA" in the last 168 hours. Coagulation Profile: No results for input(s): "INR", "PROTIME" in the last 168 hours. CBC: Recent Labs  Lab 03/09/22 0445 03/11/22 0527  WBC 5.7 8.7  HGB 7.6* 8.7*  HCT 23.8* 27.5*  MCV 81.2 83.3  PLT 257 434*   Cardiac Enzymes: No results for input(s): "CKTOTAL", "CKMB", "CKMBINDEX", "TROPONINI" in the last 168 hours. BNP: Invalid input(s): "POCBNP" CBG: Recent Labs  Lab 03/05/22 1623 03/10/22 0740 03/10/22 1650  GLUCAP 113* 70 99   HbA1C: No results for input(s): "HGBA1C" in the last 72 hours. Urine analysis:    Component Value Date/Time   COLORURINE AMBER (A) 02/16/2022 0500   APPEARANCEUR CLOUDY (A) 02/16/2022 0500   LABSPEC >1.046 (H) 02/16/2022 0500   PHURINE 5.0 02/16/2022 0500  GLUCOSEU NEGATIVE 02/16/2022 0500   HGBUR NEGATIVE 02/16/2022 0500   BILIRUBINUR SMALL (A) 02/16/2022 0500   KETONESUR NEGATIVE 02/16/2022 0500   PROTEINUR 30 (A) 02/16/2022 0500   NITRITE NEGATIVE 02/16/2022 0500   LEUKOCYTESUR NEGATIVE 02/16/2022 0500   Sepsis Labs: _0 (procalcitonin:4,lacticidven:4) )No results found for this or any previous visit (from the past 240 hour(s)).   Scheduled Meds:  allopurinol  300 mg Per Tube Daily   Chlorhexidine Gluconate Cloth  6 each Topical Daily   dextrose  0.5 ampule Intravenous Once   folic acid  1 mg Per Tube Daily   free water  100 mL Per Tube Q4H   gabapentin  300 mg Per Tube QHS   megestrol  400 mg Per Tube BID   midodrine  5 mg Per Tube TID WC   phosphorus  500 mg Per Tube BID   rivaroxaban  10 mg Per Tube Q supper   thiamine  100 mg Per Tube Daily   Continuous Infusions:  feeding supplement (VITAL AF 1.2 CAL) Stopped (03/09/22 0450)   sodium chloride       Procedures/Studies: IR Replc Gastro/Colonic Tube Percut W/Fluoro  Result Date: 03/10/2022 INDICATION: 51 year old male with a history of surgically placed gastrostomy tube which was inadvertently displaced. Tube could not be replaced at bedside at outside hospital so patient now [facility] interventional radiology for tube replacement. EXAM: GASTROSTOMY CATHETER REPLACEMENT MEDICATIONS: Ancef 2 g ANESTHESIA/SEDATION: Versed 1 mg IV; Fentanyl 25 mcg IV Moderate Sedation Time:  9 minutes The patient's vital signs and level of consciousness were continuously monitored during the procedure by the interventional radiology nurse under my direct supervision. CONTRAST:  68m OMNIPAQUE IOHEXOL 300 MG/ML SOLN - administered into the gastric lumen. FLUOROSCOPY TIME:  Radiation exposure index: 1 mGy reference air kerma COMPLICATIONS: None immediate. PROCEDURE: Informed written consent was obtained from the patient after a thorough discussion of the procedural risks, benefits and alternatives. All questions were addressed. Maximal Sterile Barrier Technique was utilized including caps, mask, sterile gowns, sterile gloves, sterile drape, hand hygiene and skin antiseptic. A timeout was performed prior to the initiation of the procedure. The prior gastrostomy tube ostomy was anesthetized by infiltration with 1% lidocaine. Using a hemostat, the overlying scab was debrided. The hemostat was then used to gently open the ostomy tract. A 5 [place] the catheter was navigated through the ostomy and into the stomach. Contrast was injected confirming that the catheter is indeed within the gastric fundus. A wire was placed. The existing ostomy tract was then dilated to 20 [place] into a 212French percutaneous gastrostomy tube was lubricated and advanced over the wire into the stomach. The retention balloon was filled with 15 mL saline and pulled snug against the anterior abdominal wall. The external bumper was  fixed in place. Contrast was injected through the tube confirming its location within the stomach. The tube was then flushed with saline and capped. The patient tolerated the procedure well. IMPRESSION: Successful replacement of 20 French percutaneous gastrostomy tube. Electronically Signed   By: HJacqulynn CadetM.D.   On: 03/10/2022 10:10   DG Chest Portable 1 View  Result Date: 03/02/2022 CLINICAL DATA:  Issues with port EXAM: PORTABLE CHEST 1 VIEW COMPARISON:  02/14/2022, CT 02/14/2022 FINDINGS: Left-sided central venous port tip over the SVC. Tubing appears grossly continuous. No pleural effusion or pneumothorax. Stable cardiomediastinal silhouette. Probable skin fold artifact over the left chest. Multiple old bilateral rib fractures. Mild nodularity in the right  upper lobe, likely corresponds to tree-in-bud density on the prior CT. IMPRESSION: 1. Left-sided central venous port tip over the SVC. Port tubing appears grossly continuous. 2. Mild nodularity in the right upper lobe adjacent to the fissure, likely corresponds to tree-in-bud density on prior CT. Electronically Signed   By: Donavan Foil M.D.   On: 03/02/2022 23:43   CT HEAD WO CONTRAST (5MM)  Result Date: 03/02/2022 CLINICAL DATA:  Head trauma. EXAM: CT HEAD WITHOUT CONTRAST TECHNIQUE: Contiguous axial images were obtained from the base of the skull through the vertex without intravenous contrast. RADIATION DOSE REDUCTION: This exam was performed according to the departmental dose-optimization program which includes automated exposure control, adjustment of the mA and/or kV according to patient size and/or use of iterative reconstruction technique. COMPARISON:  CT Head 02/28/22 FINDINGS: Brain: No evidence of acute infarction, hemorrhage, hydrocephalus, extra-axial collection or mass lesion/mass effect. Sequela of mild chronic microvascular ischemic change. Vascular: No hyperdense vessel or unexpected calcification. Skull: Normal. Negative for  fracture or focal lesion. Sinuses/Orbits: No acute finding. Other: None IMPRESSION: No acute intracranial abnormality. Electronically Signed   By: Marin Roberts M.D.   On: 03/02/2022 19:51   DG Knee 2 Views Left  Result Date: 03/01/2022 CLINICAL DATA:  Trauma EXAM: LEFT KNEE - 1-2 VIEW COMPARISON:  None Available. FINDINGS: Trace knee joint effusion. No fracture. No dislocation. Mild enthesopathic changes of the patella. Sclerotic lesion in the fibula has well-circumscribed margins and is favored to be benign in the absence of other known osseous metastases. No discernible soft tissue abnormality. IMPRESSION: Trace knee joint effusion. No fracture or dislocation. Electronically Signed   By: Marin Roberts M.D.   On: 03/01/2022 08:21   DG Knee 2 Views Right  Result Date: 03/01/2022 CLINICAL DATA:  Fall with patellar pain. EXAM: RIGHT KNEE - 1-2 VIEW COMPARISON:  None Available. FINDINGS: No evidence of regional fracture. No weight-bearing compartment degenerative disease. 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 No other finding of note. IMPRESSION: No acute or traumatic finding. 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 Electronically Signed   By: Nelson Chimes M.D.   On: 03/01/2022 08:18   CT Head Wo Contrast  Result Date: 02/28/2022 CLINICAL DATA:  Head trauma, moderate to severe. [place] getting out of bed with laceration to left eyebrow. EXAM: CT HEAD WITHOUT CONTRAST TECHNIQUE: Contiguous axial images were obtained from the base of the skull through the vertex without intravenous contrast. RADIATION DOSE REDUCTION: This exam was performed according to the departmental dose-optimization program which includes automated exposure control, adjustment of the mA and/or kV according to patient size and/or use of iterative reconstruction technique. COMPARISON:  None. FINDINGS: Brain: No acute intracranial hemorrhage, midline shift or mass effect. No extra-axial fluid collection. Diffuse  atrophy is noted. Periventricular white matter hypodensities are noted bilaterally. No hydrocephalus. Vascular: There is atherosclerotic calcification of the carotid siphons. No hyperdense vessel. Skull: Normal. Negative for fracture or focal lesion. Sinuses/Orbits: No acute finding. Other: None. IMPRESSION: 1. No acute intracranial process. 2. Atrophy with chronic microvascular ischemic changes. Electronically Signed   By: Brett Fairy M.D.   On: 02/28/2022 03:40   CT Angio Chest PE W and/or Wo Contrast  Result Date: 02/14/2022 CLINICAL DATA:  Intermittent chest pain x3 days. EXAM: CT ANGIOGRAPHY CHEST WITH CONTRAST TECHNIQUE: Multidetector CT imaging of the chest was performed using the standard protocol during bolus administration of intravenous contrast. Multiplanar CT image reconstructions and MIPs were obtained to evaluate the vascular anatomy. RADIATION DOSE REDUCTION:  This exam was performed according to the departmental dose-optimization program which includes automated exposure control, adjustment of the mA and/or kV according to patient size and/or use of iterative reconstruction technique. CONTRAST:  98m OMNIPAQUE IOHEXOL 350 MG/ML SOLN COMPARISON:  January 31, 2022 FINDINGS: Cardiovascular: A left-sided venous Port-A-Cath is noted. There is mild calcification of the aortic arch. The thoracic aorta is otherwise normal in appearance. Satisfactory opacification of the pulmonary arteries to the segmental level. No evidence of pulmonary embolism. Normal heart size with mild coronary artery calcification. No pericardial effusion. Mediastinum/Nodes: No enlarged mediastinal, hilar, or axillary lymph nodes. A dilated and debris filled proximal and mid esophagus is again seen. The distal esophageal mass noted on the prior exam is poorly visualized on the current study. The thyroid gland and trachea demonstrate no significant findings. Lungs/Pleura: Mild lingular, right upper lobe, right middle lobe and  right lower lobe tree-in-bud infiltrates are seen. Mild posterior left lower lobe tree-in-bud infiltrate is also noted. These areas are decreased in severity when compared to the prior study. A 17 mm x 11 mm area of prominent bronchiectasis is noted within the posteromedial aspect of the right lower lobe, along an area of central peribronchovascular consolidation seen on the prior exam (axial CT images 58 through 64, CT series 7). A 6 mm area of linear scarring is seen within the posterior aspect of the right lower lobe (axial CT image 53, CT series 7). An 11 mm thin walled cavitary lesion is noted within this region on the prior study. There is no evidence of a pleural effusion or pneumothorax. Upper Abdomen: A percutaneous gastrostomy tube is in place. Musculoskeletal: Numerous chronic bilateral rib fractures are again seen. Stable chronic compression fracture deformities are seen within the mid and upper thoracic spine. Review of the MIP images confirms the above findings. IMPRESSION: 1. No evidence of pulmonary embolism. 2. Mild bilateral tree-in-bud infiltrates, decreased in severity when compared to the prior study. 3. Dilated and debris filled proximal and mid esophagus without clear visualization of the distal esophageal mass noted on the prior exam. 4. Numerous chronic bilateral rib fractures and chronic compression fracture deformities within the mid and upper thoracic spine. 5. Percutaneous gastrostomy tube. 6. Aortic atherosclerosis. Aortic Atherosclerosis (ICD10-I70.0). Electronically Signed   By: TVirgina NorfolkM.D.   On: 02/14/2022 19:11   DG Chest 2 View  Result Date: 02/14/2022 CLINICAL DATA:  Chest pain EXAM: CHEST - 2 VIEW COMPARISON:  CT 01/31/2022 FINDINGS: Left chest wall port a catheter is noted with tip in the distal SVC. Heart size appears normal. No pleural effusion or edema. No airspace opacities identified. Unchanged remote healed rib fractures. Remote inferior endplate deformity  involving the T7 vertebra appears stable. IMPRESSION: No acute cardiopulmonary abnormalities. Electronically Signed   By: TKerby MoorsM.D.   On: 02/14/2022 12:16    DOrson Eva DO  [facility]  If 7PM-7AM, please contact night-coverage [email or web address] Password [email or web address] 03/12/2022, 4:00 PM   LOS: 10 days

## 2022-03-12 NOTE — Progress Notes (Signed)
Increased pt tube feeding rate to 40 mL/hr.

## 2022-03-13 DIAGNOSIS — E876 Hypokalemia: Secondary | ICD-10-CM | POA: Diagnosis not present

## 2022-03-13 DIAGNOSIS — C159 Malignant neoplasm of esophagus, unspecified: Secondary | ICD-10-CM | POA: Diagnosis not present

## 2022-03-13 DIAGNOSIS — E870 Hyperosmolality and hypernatremia: Secondary | ICD-10-CM | POA: Diagnosis not present

## 2022-03-13 DIAGNOSIS — R627 Adult failure to thrive: Secondary | ICD-10-CM | POA: Diagnosis not present

## 2022-03-13 LAB — PHOSPHORUS: Phosphorus: 2.2 mg/dL — ABNORMAL LOW (ref 2.5–4.6)

## 2022-03-13 LAB — MAGNESIUM: Magnesium: 1.9 mg/dL (ref 1.7–2.4)

## 2022-03-13 LAB — BASIC METABOLIC PANEL
Anion gap: 4 — ABNORMAL LOW (ref 5–15)
BUN: 25 mg/dL — ABNORMAL HIGH (ref 6–20)
CO2: 20 mmol/L — ABNORMAL LOW (ref 22–32)
Calcium: 8.6 mg/dL — ABNORMAL LOW (ref 8.9–10.3)
Chloride: 114 mmol/L — ABNORMAL HIGH (ref 98–111)
Creatinine, Ser: 0.63 mg/dL (ref 0.61–1.24)
GFR, Estimated: >60 mL/min (ref >60)
Glucose, Bld: 62 mg/dL — ABNORMAL LOW (ref 70–99)
Potassium: 3.2 mmol/L — ABNORMAL LOW (ref 3.5–5.1)
Sodium: 138 mmol/L (ref 135–145)

## 2022-03-13 MED ORDER — POTASSIUM CHLORIDE 20 MEQ PO PACK
20.0000 meq | PACK | Freq: Once | ORAL | Status: AC
  Administered 2022-03-13: 20 meq
  Filled 2022-03-13: qty 1

## 2022-03-13 NOTE — Progress Notes (Signed)
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 He has frequent falls at home worse in last couple of days. He is dizzy all the time.  He is not doing water flushes in PEG and continues to smoke.  He refuses SNF.  His chemotherapy was placed on hold due to severe weight loss.  He does not accept that he is dying. He wants to remain full code.  He demands to be admitted to [facility], adamantly refusing to stay at [facility].  He was noted to have an AKI with severe hypernatremia consistent with worsening volume and nutritional status.  He was agreeable to IV fluids overnight but wants to be discharged home tomorrow.   He again refuses to be admitted to [facility].   As the hospitalization progress,  he ultimately agreed to stay at [facility] after he came to the realization that he would not receive any different care at [facility].  He was started on hypotonic fluid  and free water flushes with improvement in his hypernatremia.   His hospitalization was prolonged due to G-tube malfunction requiring replacement and then dislodgement of his Gtube.  This required IR to place new G-tube.  Subsequently, delay in insurance authorization resulted in further delay.   Assessment/Plan: Hypernatremia / Severe Dehydration  - He has severe free water deficiency - Receiving D5W infusion (35 ml/hr) and free water (100 ml q4h) -Sodium normalized after supplementing adequate free water and IVF -given 1/2NS boluses also   Hypotension -pt has had soft BPs for most of his hospitalization -check lactic 1.2 -check PCT 0.13 -rebolused LR and 1/2 NS -started  midodrine -am cortisol 4.0 -11/15 1345--97.4--110--16--87/62--99% on 2L -11/16 0800--98.8--98--16--91/70--100% 2L -11/18--BP overall improved with midodrine   G-tube Malfunction -G tube has a hole>>leaking -reconsult general surgery>>placed new Gtube 11/15 -early AM 03/09/22--Gtube "fell out" -discussed with Dr. Morene Rankins meatus closed>>consult IR to place new PEG via old tract -11/17--new G-tube placed by IR>>restart enteral feeding -goal rate of Vital 1.2 = 60 cc/hr   Hypokalemia Secondary to poor oral intake. Patient states that the Osmolite which was prescribed last month gives him "terrible diarrhea" so he has not been compliant with it. Dietician consult placed and patient will be started on  - Magnesium is normal - replace potassium regularly (he is supposed to be taking daily liquid potassium) - repleted    Stage IV B Metastatic Squamous Cell Cancer - his chemotherapy was placed on temporary hold due to severe weight loss - he is followed by Dr. Delton Coombes - palliative care met with him during last admission and he remained in denial about the severity of illness and wanted to be full code/full scope care  - follow up with Dr. Delton Coombes for ongoing discussion   Frequent Falls - likely exacerbated by severe debility, malnutrition, weight loss - fall precautions recommended - PT ordered recommending SNF    Failure to thrive  - Pt seems unable to care for self at home - PT>>SNF   Severe protein calorie malnutrition - resume regular diet plus PEG tube feeds  - Phosphorous nadir 1.4 - monitor for refeeding syndrome, recheck Mg, Phos in AM, replete as  needed    Refeeding Syndrome - replacing phosphorus and Mg as needed    Hypophosphatemia - IV replacement and PEG replacement ordered    AKI Resolved - prerenal from severe dehydration  -serum creatinine peaked 1.32 -resolved   Tobacco  - nicotine patch ordered - counseled patient on tobacco cessation     Chronic nausea and vomiting - secondary to esophageal malignancy - treat supportively - pt insists upon po intake, will not follow medical advice   History of cavitary pneumonia - completed a 4 week course of augmentin  - now stable on RA without leukocytosis   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  - gabapentin    Leukocytosis  - pt completed 4 week course of augmentin for cavitary pneumonia treatment -now due to stress demargination -afebrile and resolved with tx of acute medical issues       Subjective: Continues to eat outside food resulting in vomiting.  Denies cp, sob, abd pain.  Refused PT eval today.    Objective: Vitals:   03/12/22 2100 03/13/22 0600 03/13/22 0843 03/13/22 1507  BP: 103/77 1_0  Pulse: 87 95 100 98  Resp: _1 Temp: 98.2 F (36.8 C) 98.3 F (36.8 C)  99 F (37.2 C)  TempSrc: Oral Oral  Oral  SpO2:  100%  100%  Weight:  42.6 kg    Height:        Intake/Output Summary (Last 24 hours) at 03/13/2022 1733 Last data filed at 03/12/2022 1800 Gross per 24 hour  Intake 108.89 ml  Output --  Net 108.89 ml   Weight change: 1.4 kg Exam:  General:  Pt is alert, follows commands appropriately, not in acute distress HEENT: No icterus, No thrush, No neck mass, [place]/AT Cardiovascular: RRR, S1/S2, no rubs, no gallops Respiratory: bibasilar rales. No wheeze Abdomen: Soft/+BS, non tender, non distended, no guarding Extremities: No edema, No lymphangitis, No petechiae, No rashes, no synovitis   Data Reviewed: I have personally reviewed following labs and imaging studies Basic Metabolic Panel: Recent Labs  Lab 03/07/22 0437 03/09/22 0445 03/11/22 0527 03/12/22 0529 03/13/22 0658  NA 136 135 144 141 138  K 4.1 3.9 3.0* 3.4* 3.2*  CL 109 110 116* 116* 114*  CO2 20* 22 22 21* 20*  GLUCOSE 152* 87 102* 92 62*  BUN 19 23* 28* 27* 25*  CREATININE 0.68 0.64 0.70 0.62 0.63  CALCIUM 8.4* 7.8* 9.0 8.6* 8.6*  MG  2.0 1.8 2.0 1.9 1.9  PHOS 2.5 1.4* 2.8 2.2* 2.2*   Liver Function Tests: No results for input(s): "AST", "ALT", "ALKPHOS", "BILITOT", "PROT", "ALBUMIN" in the last 168 hours. No results for input(s): "LIPASE", "AMYLASE" in the last 168 hours. No results for input(s): "AMMONIA" in the last 168 hours. Coagulation Profile: No results for input(s): "INR", "PROTIME" in the last 168 hours. CBC: Recent Labs  Lab 03/09/22 0445 03/11/22 0527  WBC 5.7 8.7  HGB 7.6* 8.7*  HCT 23.8* 27.5*  MCV 81.2 83.3  PLT 257 434*   Cardiac Enzymes: No results for input(s): "CKTOTAL", "CKMB", "CKMBINDEX", "TROPONINI" in the last 168 hours. BNP: Invalid input(s): "POCBNP" CBG: Recent Labs  Lab 03/10/22 0740 03/10/22 1650  GLUCAP 70 99   HbA1C: No results for input(s): "HGBA1C" in the last 72 hours. Urine analysis:    Component Value Date/Time   COLORURINE AMBER (A) 02/16/2022 0500   APPEARANCEUR CLOUDY (A) 02/16/2022 0500   LABSPEC >1.046 (H) 02/16/2022 0500   PHURINE  5.0 02/16/2022 0500   GLUCOSEU NEGATIVE 02/16/2022 0500   HGBUR NEGATIVE 02/16/2022 0500   BILIRUBINUR SMALL (A) 02/16/2022 0500   KETONESUR NEGATIVE 02/16/2022 0500   PROTEINUR 30 (A) 02/16/2022 0500   NITRITE NEGATIVE 02/16/2022 0500   LEUKOCYTESUR NEGATIVE 02/16/2022 0500   Sepsis Labs: _0 (procalcitonin:4,lacticidven:4) )No results found for this or any previous visit (from the past 240 hour(s)).   Scheduled Meds:  allopurinol  300 mg Per Tube Daily   Chlorhexidine Gluconate Cloth  6 each Topical Daily   dextrose  0.5 ampule Intravenous Once   folic acid  1 mg Per Tube Daily   free water  100 mL Per Tube Q4H   gabapentin  300 mg Per Tube QHS   megestrol  400 mg Per Tube BID   midodrine  5 mg Per Tube TID WC   phosphorus  500 mg Per Tube BID   rivaroxaban  10 mg Per Tube Q supper   thiamine  100 mg Per Tube Daily   Continuous Infusions:  feeding supplement (VITAL AF 1.2 CAL) 1,000 mL (03/13/22 0302)     Procedures/Studies: IR Replc Gastro/Colonic Tube Percut W/Fluoro  Result Date: 03/10/2022 INDICATION: 51 year old male with a history of surgically placed gastrostomy tube which was inadvertently displaced. Tube could not be replaced at bedside at outside hospital so patient now presents to [facility] interventional radiology for tube replacement. EXAM: GASTROSTOMY CATHETER REPLACEMENT MEDICATIONS: Ancef 2 g ANESTHESIA/SEDATION: Versed 1 mg IV; Fentanyl 25 mcg IV Moderate Sedation Time:  9 minutes The patient's vital signs and level of consciousness were continuously monitored during the procedure by the interventional radiology nurse under my direct supervision. CONTRAST:  67m OMNIPAQUE IOHEXOL 300 MG/ML SOLN - administered into the gastric lumen. FLUOROSCOPY TIME:  Radiation exposure index: 1 mGy reference air kerma COMPLICATIONS: None immediate. PROCEDURE: Informed written consent was obtained from the patient after a thorough discussion of the procedural risks, benefits and alternatives. All questions were addressed. Maximal Sterile Barrier Technique was utilized including caps, mask, sterile gowns, sterile gloves, sterile drape, hand hygiene and skin antiseptic. A timeout was performed prior to the initiation of the procedure. The prior gastrostomy tube ostomy was anesthetized by infiltration with 1% lidocaine. Using a hemostat, the overlying scab was debrided. The hemostat was then used to gently open the ostomy tract. A 5 [place] the catheter was navigated through the ostomy and into the stomach. Contrast was injected confirming that the catheter is indeed within the gastric fundus. A wire was placed. The existing ostomy tract was then dilated to 20 [place] into a 272French percutaneous gastrostomy tube was lubricated and advanced over the wire into the stomach. The retention balloon was filled with 15 mL saline and pulled snug against the anterior abdominal wall. The external bumper was  fixed in place. Contrast was injected through the tube confirming its location within the stomach. The tube was then flushed with saline and capped. The patient tolerated the procedure well. IMPRESSION: Successful replacement of 20 French percutaneous gastrostomy tube. Electronically Signed   By: HJacqulynn CadetM.D.   On: 03/10/2022 10:10   DG Chest Portable 1 View  Result Date: 03/02/2022 CLINICAL DATA:  Issues with port EXAM: PORTABLE CHEST 1 VIEW COMPARISON:  02/14/2022, CT 02/14/2022 FINDINGS: Left-sided central venous port tip over the SVC. Tubing appears grossly continuous. No pleural effusion or pneumothorax. Stable cardiomediastinal silhouette. Probable skin fold artifact over the left chest. Multiple old bilateral rib fractures. Mild nodularity in the right  upper lobe, likely corresponds to tree-in-bud density on the prior CT. IMPRESSION: 1. Left-sided central venous port tip over the SVC. Port tubing appears grossly continuous. 2. Mild nodularity in the right upper lobe adjacent to the fissure, likely corresponds to tree-in-bud density on prior CT. Electronically Signed   By: Donavan Foil M.D.   On: 03/02/2022 23:43   CT HEAD WO CONTRAST (5MM)  Result Date: 03/02/2022 CLINICAL DATA:  Head trauma. EXAM: CT HEAD WITHOUT CONTRAST TECHNIQUE: Contiguous axial images were obtained from the base of the skull through the vertex without intravenous contrast. RADIATION DOSE REDUCTION: This exam was performed according to the departmental dose-optimization program which includes automated exposure control, adjustment of the mA and/or kV according to patient size and/or use of iterative reconstruction technique. COMPARISON:  CT Head 02/28/22 FINDINGS: Brain: No evidence of acute infarction, hemorrhage, hydrocephalus, extra-axial collection or mass lesion/mass effect. Sequela of mild chronic microvascular ischemic change. Vascular: No hyperdense vessel or unexpected calcification. Skull: Normal. Negative for  fracture or focal lesion. Sinuses/Orbits: No acute finding. Other: None IMPRESSION: No acute intracranial abnormality. Electronically Signed   By: Marin Roberts M.D.   On: 03/02/2022 19:51   DG Knee 2 Views Left  Result Date: 03/01/2022 CLINICAL DATA:  Trauma EXAM: LEFT KNEE - 1-2 VIEW COMPARISON:  None Available. FINDINGS: Trace knee joint effusion. No fracture. No dislocation. Mild enthesopathic changes of the patella. Sclerotic lesion in the fibula has well-circumscribed margins and is favored to be benign in the absence of other known osseous metastases. No discernible soft tissue abnormality. IMPRESSION: Trace knee joint effusion. No fracture or dislocation. Electronically Signed   By: Marin Roberts M.D.   On: 03/01/2022 08:21   DG Knee 2 Views Right  Result Date: 03/01/2022 CLINICAL DATA:  Fall with patellar pain. EXAM: RIGHT KNEE - 1-2 VIEW COMPARISON:  None Available. FINDINGS: No evidence of regional fracture. No weight-bearing compartment degenerative disease. 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 No other finding of note. IMPRESSION: No acute or traumatic finding. 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 Electronically Signed   By: Nelson Chimes M.D.   On: 03/01/2022 08:18   CT Head Wo Contrast  Result Date: 02/28/2022 CLINICAL DATA:  Head trauma, moderate to severe. [place] getting out of bed with laceration to left eyebrow. EXAM: CT HEAD WITHOUT CONTRAST TECHNIQUE: Contiguous axial images were obtained from the base of the skull through the vertex without intravenous contrast. RADIATION DOSE REDUCTION: This exam was performed according to the departmental dose-optimization program which includes automated exposure control, adjustment of the mA and/or kV according to patient size and/or use of iterative reconstruction technique. COMPARISON:  None. FINDINGS: Brain: No acute intracranial hemorrhage, midline shift or mass effect. No extra-axial fluid collection. Diffuse  atrophy is noted. Periventricular white matter hypodensities are noted bilaterally. No hydrocephalus. Vascular: There is atherosclerotic calcification of the carotid siphons. No hyperdense vessel. Skull: Normal. Negative for fracture or focal lesion. Sinuses/Orbits: No acute finding. Other: None. IMPRESSION: 1. No acute intracranial process. 2. Atrophy with chronic microvascular ischemic changes. Electronically Signed   By: Brett Fairy M.D.   On: 02/28/2022 03:40   CT Angio Chest PE W and/or Wo Contrast  Result Date: 02/14/2022 CLINICAL DATA:  Intermittent chest pain x3 days. EXAM: CT ANGIOGRAPHY CHEST WITH CONTRAST TECHNIQUE: Multidetector CT imaging of the chest was performed using the standard protocol during bolus administration of intravenous contrast. Multiplanar CT image reconstructions and MIPs were obtained to evaluate the vascular anatomy. RADIATION DOSE REDUCTION:  This exam was performed according to the departmental dose-optimization program which includes automated exposure control, adjustment of the mA and/or kV according to patient size and/or use of iterative reconstruction technique. CONTRAST:  81m OMNIPAQUE IOHEXOL 350 MG/ML SOLN COMPARISON:  January 31, 2022 FINDINGS: Cardiovascular: A left-sided venous Port-A-Cath is noted. There is mild calcification of the aortic arch. The thoracic aorta is otherwise normal in appearance. Satisfactory opacification of the pulmonary arteries to the segmental level. No evidence of pulmonary embolism. Normal heart size with mild coronary artery calcification. No pericardial effusion. Mediastinum/Nodes: No enlarged mediastinal, hilar, or axillary lymph nodes. A dilated and debris filled proximal and mid esophagus is again seen. The distal esophageal mass noted on the prior exam is poorly visualized on the current study. The thyroid gland and trachea demonstrate no significant findings. Lungs/Pleura: Mild lingular, right upper lobe, right middle lobe and  right lower lobe tree-in-bud infiltrates are seen. Mild posterior left lower lobe tree-in-bud infiltrate is also noted. These areas are decreased in severity when compared to the prior study. A 17 mm x 11 mm area of prominent bronchiectasis is noted within the posteromedial aspect of the right lower lobe, along an area of central peribronchovascular consolidation seen on the prior exam (axial CT images 58 through 64, CT series 7). A 6 mm area of linear scarring is seen within the posterior aspect of the right lower lobe (axial CT image 53, CT series 7). An 11 mm thin walled cavitary lesion is noted within this region on the prior study. There is no evidence of a pleural effusion or pneumothorax. Upper Abdomen: A percutaneous gastrostomy tube is in place. Musculoskeletal: Numerous chronic bilateral rib fractures are again seen. Stable chronic compression fracture deformities are seen within the mid and upper thoracic spine. Review of the MIP images confirms the above findings. IMPRESSION: 1. No evidence of pulmonary embolism. 2. Mild bilateral tree-in-bud infiltrates, decreased in severity when compared to the prior study. 3. Dilated and debris filled proximal and mid esophagus without clear visualization of the distal esophageal mass noted on the prior exam. 4. Numerous chronic bilateral rib fractures and chronic compression fracture deformities within the mid and upper thoracic spine. 5. Percutaneous gastrostomy tube. 6. Aortic atherosclerosis. Aortic Atherosclerosis (ICD10-I70.0). Electronically Signed   By: TVirgina NorfolkM.D.   On: 02/14/2022 19:11   DG Chest 2 View  Result Date: 02/14/2022 CLINICAL DATA:  Chest pain EXAM: CHEST - 2 VIEW COMPARISON:  CT 01/31/2022 FINDINGS: Left chest wall port a catheter is noted with tip in the distal SVC. Heart size appears normal. No pleural effusion or edema. No airspace opacities identified. Unchanged remote healed rib fractures. Remote inferior endplate deformity  involving the T7 vertebra appears stable. IMPRESSION: No acute cardiopulmonary abnormalities. Electronically Signed   By: TKerby MoorsM.D.   On: 02/14/2022 12:16    DOrson Eva DO  [facility]  If 7PM-7AM, please contact night-coverage [email or web address] Password [email or web address]11/20/2023, 5:33 PM   LOS: 11 days

## 2022-03-14 DIAGNOSIS — N179 Acute kidney failure, unspecified: Secondary | ICD-10-CM | POA: Diagnosis not present

## 2022-03-14 DIAGNOSIS — E870 Hyperosmolality and hypernatremia: Secondary | ICD-10-CM | POA: Diagnosis not present

## 2022-03-14 DIAGNOSIS — C159 Malignant neoplasm of esophagus, unspecified: Secondary | ICD-10-CM | POA: Diagnosis not present

## 2022-03-14 DIAGNOSIS — E876 Hypokalemia: Secondary | ICD-10-CM | POA: Diagnosis not present

## 2022-03-14 LAB — BASIC METABOLIC PANEL
Anion gap: 5 (ref 5–15)
BUN: 27 mg/dL — ABNORMAL HIGH (ref 6–20)
CO2: 19 mmol/L — ABNORMAL LOW (ref 22–32)
Calcium: 8.4 mg/dL — ABNORMAL LOW (ref 8.9–10.3)
Chloride: 115 mmol/L — ABNORMAL HIGH (ref 98–111)
Creatinine, Ser: 0.59 mg/dL — ABNORMAL LOW (ref 0.61–1.24)
GFR, Estimated: >60 mL/min (ref >60)
Glucose, Bld: 74 mg/dL (ref 70–99)
Potassium: 2.9 mmol/L — ABNORMAL LOW (ref 3.5–5.1)
Sodium: 139 mmol/L (ref 135–145)

## 2022-03-14 LAB — MAGNESIUM: Magnesium: 1.9 mg/dL (ref 1.7–2.4)

## 2022-03-14 LAB — PHOSPHORUS: Phosphorus: 2.7 mg/dL (ref 2.5–4.6)

## 2022-03-14 MED ORDER — POTASSIUM CHLORIDE 20 MEQ PO PACK
40.0000 meq | PACK | Freq: Every day | ORAL | Status: DC
  Administered 2022-03-14 – 2022-03-15 (×2): 40 meq via ORAL
  Filled 2022-03-14 (×2): qty 2

## 2022-03-14 MED ORDER — K PHOS MONO-SOD PHOS DI & MONO 155-852-130 MG PO TABS
500.0000 mg | ORAL_TABLET | Freq: Every day | ORAL | Status: DC
  Administered 2022-03-15 – 2022-03-22 (×8): 500 mg
  Filled 2022-03-14 (×9): qty 2

## 2022-03-14 NOTE — Progress Notes (Signed)
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 He has frequent falls at home worse in last couple of days. He is dizzy all the time.  He is not doing water flushes in PEG and continues to smoke.  He refuses SNF.  His chemotherapy was placed on hold due to severe weight loss.  He does not accept that he is dying. He wants to remain full code.  He demands to be admitted to [facility], adamantly refusing to stay at [facility].  He was noted to have an AKI with severe hypernatremia consistent with worsening volume and nutritional status.  He was agreeable to IV fluids overnight but wants to be discharged home tomorrow.   He again refuses to be admitted to [facility].   As the hospitalization progress,  he ultimately agreed to stay at [facility] after he came to the realization that he would not receive any different care at [facility].  He was started on hypotonic fluid  and free water flushes with improvement in his hypernatremia.   His hospitalization was prolonged due to G-tube malfunction requiring replacement and then dislodgement of his Gtube.  This required IR to place new G-tube.  Subsequently, delay in insurance authorization resulted in further delay.   Assessment/Plan:  Hypernatremia / Severe Dehydration  - He has severe free water deficiency - Receiving D5W infusion (35 ml/hr) and free water (100 ml q4h) -Sodium normalized after supplementing adequate free water and IVF -given 1/2NS boluses also -now just getting free water via PEG   Hypotension -pt has had soft BPs for most of his hospitalization -check lactic 1.2 -check PCT  0.13 -rebolused LR and 1/2 NS -started midodrine -am cortisol 4.0 -11/15 1345--97.4--110--16--87/62--99% on 2L -11/16 0800--98.8--98--16--91/70--100% 2L -11/18--BP overall improved with midodrine   G-tube Malfunction -G tube has a hole>>leaking -reconsult general surgery>>placed new Gtube 11/15 -early AM 03/09/22--Gtube "fell out" -discussed with Dr. Morene Rankins meatus closed>>consult IR to place new PEG via old tract -11/17--new G-tube placed by IR>>restart enteral feeding -goal rate of Vital 1.2 = 60 cc/hr   Hypokalemia Secondary to poor oral intake. Patient states that the Osmolite which was prescribed last month gives him "terrible diarrhea" so he has not been compliant with it. Dietician consult placed and patient will be started on  - Magnesium is normal - replace potassium regularly (he is supposed to be taking daily liquid potassium) - repleted    Stage IV B Metastatic Squamous Cell Cancer - his chemotherapy was placed on temporary hold due to severe weight loss - he is followed by Dr. Delton Coombes - palliative care met with him during last admission and he remained in denial about the severity of illness and wanted to be full code/full scope care  - follow up with Dr. Delton Coombes for ongoing discussion   Frequent Falls - likely exacerbated by severe debility, malnutrition, weight loss - fall precautions recommended - PT ordered recommending SNF    Failure to thrive  - Pt seems unable to care for self at home - PT>>SNF   Severe protein calorie malnutrition - resume regular diet plus PEG tube feeds  - Phosphorous nadir 1.4 - monitor for refeeding  syndrome, recheck Mg, Phos in AM, replete as needed    Refeeding Syndrome - replacing phosphorus and Mg as needed    Hypophosphatemia - IV replacement and PEG replacement ordered  -continue Kphos once daily    AKI Resolved - prerenal from severe dehydration  -serum creatinine peaked 1.32 -resolved   Tobacco   - nicotine patch ordered - counseled patient on tobacco cessation    Chronic nausea and vomiting - secondary to esophageal malignancy - treat supportively - pt insists upon po intake, will not follow medical advice   History of cavitary pneumonia - completed a 4 week course of augmentin  - now stable on RA without leukocytosis   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  - gabapentin    Leukocytosis  - pt completed 4 week course of augmentin for cavitary pneumonia treatment -now due to stress demargination -afebrile and resolved with tx of acute medical issues        Family Communication: none available  Consultants:  general surgery, IR, palliative  Code Status:  FULL   DVT Prophylaxis:  Xarelto   Procedures: As Listed in Progress Note Above  Antibiotics: None       Subjective: He has intermittent vomiting when taking po. Denies f/c, cp, sob, n/v/d, abd pain  Objective: Vitals:   03/13/22 2135 03/14/22 0321 03/14/22 0500 03/14/22 1414  BP: (!) 85/63 92/70  104/74  Pulse: 88 88  82  Resp: 18 17    Temp: 98.7 F (37.1 C) 98.1 F (36.7 C)  98.7 F (37.1 C)  TempSrc: Oral Oral  Oral  SpO2: 100% 100%  100%  Weight:   40 kg   Height:        Intake/Output Summary (Last 24 hours) at 03/14/2022 1534 Last data filed at 03/14/2022 1300 Gross per 24 hour  Intake 720 ml  Output --  Net 720 ml   Weight change: -2.6 kg Exam:  General:  Pt is alert, follows commands appropriately, not in acute distress HEENT: No icterus, No thrush, No neck mass, [place]/AT Cardiovascular: RRR, S1/S2, no rubs, no gallops Respiratory: bibasilar rales. No wheeze Abdomen: Soft/+BS, non tender, non distended, no guarding Extremities: No edema, No lymphangitis, No petechiae, No rashes, no synovitis   Data Reviewed: I have personally reviewed following labs and imaging studies Basic Metabolic Panel: Recent Labs  Lab 03/09/22 0445 03/11/22 0527 03/12/22 0529  03/13/22 0658 03/14/22 0312  NA 135 144 141 138 139  K 3.9 3.0* 3.4* 3.2* 2.9*  CL 110 116* 116* 114* 115*  CO2 22 22 21* 20* 19*  GLUCOSE 87 102* 92 62* 74  BUN 23* 28* 27* 25* 27*  CREATININE 0.64 0.70 0.62 0.63 0.59*  CALCIUM 7.8* 9.0 8.6* 8.6* 8.4*  MG 1.8 2.0 1.9 1.9 1.9  PHOS 1.4* 2.8 2.2* 2.2* 2.7   Liver Function Tests: No results for input(s): "AST", "ALT", "ALKPHOS", "BILITOT", "PROT", "ALBUMIN" in the last 168 hours. No results for input(s): "LIPASE", "AMYLASE" in the last 168 hours. No results for input(s): "AMMONIA" in the last 168 hours. Coagulation Profile: No results for input(s): "INR", "PROTIME" in the last 168 hours. CBC: Recent Labs  Lab 03/09/22 0445 03/11/22 0527  WBC 5.7 8.7  HGB 7.6* 8.7*  HCT 23.8* 27.5*  MCV 81.2 83.3  PLT 257 434*   Cardiac Enzymes: No results for input(s): "CKTOTAL", "CKMB", "CKMBINDEX", "TROPONINI" in the last 168 hours. BNP: Invalid input(s): "POCBNP" CBG: Recent Labs  Lab 03/10/22 0740 03/10/22 1650  GLUCAP 70 99   HbA1C: No results for input(s): "HGBA1C" in the last 72 hours. Urine analysis:    Component Value Date/Time   COLORURINE AMBER (A) 02/16/2022 0500   APPEARANCEUR CLOUDY (A) 02/16/2022 0500   LABSPEC >1.046 (H) 02/16/2022 0500   PHURINE 5.0 02/16/2022 0500   GLUCOSEU NEGATIVE 02/16/2022 0500   HGBUR NEGATIVE 02/16/2022 0500   BILIRUBINUR SMALL (A) 02/16/2022 0500   KETONESUR NEGATIVE 02/16/2022 0500   PROTEINUR 30 (A) 02/16/2022 0500   NITRITE NEGATIVE 02/16/2022 0500   LEUKOCYTESUR NEGATIVE 02/16/2022 0500   Sepsis Labs: _0 (procalcitonin:4,lacticidven:4) )No results found for this or any previous visit (from the past 240 hour(s)).   Scheduled Meds:  allopurinol  300 mg Per Tube Daily   Chlorhexidine Gluconate Cloth  6 each Topical Daily   dextrose  0.5 ampule Intravenous Once   folic acid  1 mg Per Tube Daily   free water  100 mL Per Tube Q4H   gabapentin  300 mg Per Tube QHS    megestrol  400 mg Per Tube BID   midodrine  5 mg Per Tube TID WC   [START ON 03/15/2022] phosphorus  500 mg Per Tube Daily   potassium chloride  40 mEq Oral Daily   rivaroxaban  10 mg Per Tube Q supper   thiamine  100 mg Per Tube Daily   Continuous Infusions:  feeding supplement (VITAL AF 1.2 CAL) 1,000 mL (03/13/22 2152)    Procedures/Studies: IR Replc Gastro/Colonic Tube Percut W/Fluoro  Result Date: 03/10/2022 INDICATION: 51 year old male with a history of surgically placed gastrostomy tube which was inadvertently displaced. Tube could not be replaced at bedside at outside hospital so patient now presents to [facility] interventional radiology for tube replacement. EXAM: GASTROSTOMY CATHETER REPLACEMENT MEDICATIONS: Ancef 2 g ANESTHESIA/SEDATION: Versed 1 mg IV; Fentanyl 25 mcg IV Moderate Sedation Time:  9 minutes The patient's vital signs and level of consciousness were continuously monitored during the procedure by the interventional radiology nurse under my direct supervision. CONTRAST:  91m OMNIPAQUE IOHEXOL 300 MG/ML SOLN - administered into the gastric lumen. FLUOROSCOPY TIME:  Radiation exposure index: 1 mGy reference air kerma COMPLICATIONS: None immediate. PROCEDURE: Informed written consent was obtained from the patient after a thorough discussion of the procedural risks, benefits and alternatives. All questions were addressed. Maximal Sterile Barrier Technique was utilized including caps, mask, sterile gowns, sterile gloves, sterile drape, hand hygiene and skin antiseptic. A timeout was performed prior to the initiation of the procedure. The prior gastrostomy tube ostomy was anesthetized by infiltration with 1% lidocaine. Using a hemostat, the overlying scab was debrided. The hemostat was then used to gently open the ostomy tract. A 5 [place] the catheter was navigated through the ostomy and into the stomach. Contrast was injected confirming that the catheter is indeed within the  gastric fundus. A wire was placed. The existing ostomy tract was then dilated to 20 [place] into a 232French percutaneous gastrostomy tube was lubricated and advanced over the wire into the stomach. The retention balloon was filled with 15 mL saline and pulled snug against the anterior abdominal wall. The external bumper was fixed in place. Contrast was injected through the tube confirming its location within the stomach. The tube was then flushed with saline and capped. The patient tolerated the procedure well. IMPRESSION: Successful replacement of 20 French percutaneous gastrostomy tube. Electronically Signed   By: HJacqulynn CadetM.D.   On: 03/10/2022 10:10   DG Chest Portable 1  View  Result Date: 03/02/2022 CLINICAL DATA:  Issues with port EXAM: PORTABLE CHEST 1 VIEW COMPARISON:  02/14/2022, CT 02/14/2022 FINDINGS: Left-sided central venous port tip over the SVC. Tubing appears grossly continuous. No pleural effusion or pneumothorax. Stable cardiomediastinal silhouette. Probable skin fold artifact over the left chest. Multiple old bilateral rib fractures. Mild nodularity in the right upper lobe, likely corresponds to tree-in-bud density on the prior CT. IMPRESSION: 1. Left-sided central venous port tip over the SVC. Port tubing appears grossly continuous. 2. Mild nodularity in the right upper lobe adjacent to the fissure, likely corresponds to tree-in-bud density on prior CT. Electronically Signed   By: Donavan Foil M.D.   On: 03/02/2022 23:43   CT HEAD WO CONTRAST (5MM)  Result Date: 03/02/2022 CLINICAL DATA:  Head trauma. EXAM: CT HEAD WITHOUT CONTRAST TECHNIQUE: Contiguous axial images were obtained from the base of the skull through the vertex without intravenous contrast. RADIATION DOSE REDUCTION: This exam was performed according to the departmental dose-optimization program which includes automated exposure control, adjustment of the mA and/or kV according to patient size and/or use of  iterative reconstruction technique. COMPARISON:  CT Head 02/28/22 FINDINGS: Brain: No evidence of acute infarction, hemorrhage, hydrocephalus, extra-axial collection or mass lesion/mass effect. Sequela of mild chronic microvascular ischemic change. Vascular: No hyperdense vessel or unexpected calcification. Skull: Normal. Negative for fracture or focal lesion. Sinuses/Orbits: No acute finding. Other: None IMPRESSION: No acute intracranial abnormality. Electronically Signed   By: Marin Roberts M.D.   On: 03/02/2022 19:51   DG Knee 2 Views Left  Result Date: 03/01/2022 CLINICAL DATA:  Trauma EXAM: LEFT KNEE - 1-2 VIEW COMPARISON:  None Available. FINDINGS: Trace knee joint effusion. No fracture. No dislocation. Mild enthesopathic changes of the patella. Sclerotic lesion in the fibula has well-circumscribed margins and is favored to be benign in the absence of other known osseous metastases. No discernible soft tissue abnormality. IMPRESSION: Trace knee joint effusion. No fracture or dislocation. Electronically Signed   By: Marin Roberts M.D.   On: 03/01/2022 08:21   DG Knee 2 Views Right  Result Date: 03/01/2022 CLINICAL DATA:  Fall with patellar pain. EXAM: RIGHT KNEE - 1-2 VIEW COMPARISON:  None Available. FINDINGS: No evidence of regional fracture. No weight-bearing compartment degenerative disease. 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 No other finding of note. IMPRESSION: No acute or traumatic finding. 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 Electronically Signed   By: Nelson Chimes M.D.   On: 03/01/2022 08:18   CT Head Wo Contrast  Result Date: 02/28/2022 CLINICAL DATA:  Head trauma, moderate to severe. [place] getting out of bed with laceration to left eyebrow. EXAM: CT HEAD WITHOUT CONTRAST TECHNIQUE: Contiguous axial images were obtained from the base of the skull through the vertex without intravenous contrast. RADIATION DOSE REDUCTION: This exam was performed according to  the departmental dose-optimization program which includes automated exposure control, adjustment of the mA and/or kV according to patient size and/or use of iterative reconstruction technique. COMPARISON:  None. FINDINGS: Brain: No acute intracranial hemorrhage, midline shift or mass effect. No extra-axial fluid collection. Diffuse atrophy is noted. Periventricular white matter hypodensities are noted bilaterally. No hydrocephalus. Vascular: There is atherosclerotic calcification of the carotid siphons. No hyperdense vessel. Skull: Normal. Negative for fracture or focal lesion. Sinuses/Orbits: No acute finding. Other: None. IMPRESSION: 1. No acute intracranial process. 2. Atrophy with chronic microvascular ischemic changes. Electronically Signed   By: Brett Fairy M.D.   On: 02/28/2022 03:40   CT  Angio Chest PE W and/or Wo Contrast  Result Date: 02/14/2022 CLINICAL DATA:  Intermittent chest pain x3 days. EXAM: CT ANGIOGRAPHY CHEST WITH CONTRAST TECHNIQUE: Multidetector CT imaging of the chest was performed using the standard protocol during bolus administration of intravenous contrast. Multiplanar CT image reconstructions and MIPs were obtained to evaluate the vascular anatomy. RADIATION DOSE REDUCTION: This exam was performed according to the departmental dose-optimization program which includes automated exposure control, adjustment of the mA and/or kV according to patient size and/or use of iterative reconstruction technique. CONTRAST:  6m OMNIPAQUE IOHEXOL 350 MG/ML SOLN COMPARISON:  January 31, 2022 FINDINGS: Cardiovascular: A left-sided venous Port-A-Cath is noted. There is mild calcification of the aortic arch. The thoracic aorta is otherwise normal in appearance. Satisfactory opacification of the pulmonary arteries to the segmental level. No evidence of pulmonary embolism. Normal heart size with mild coronary artery calcification. No pericardial effusion. Mediastinum/Nodes: No enlarged mediastinal,  hilar, or axillary lymph nodes. A dilated and debris filled proximal and mid esophagus is again seen. The distal esophageal mass noted on the prior exam is poorly visualized on the current study. The thyroid gland and trachea demonstrate no significant findings. Lungs/Pleura: Mild lingular, right upper lobe, right middle lobe and right lower lobe tree-in-bud infiltrates are seen. Mild posterior left lower lobe tree-in-bud infiltrate is also noted. These areas are decreased in severity when compared to the prior study. A 17 mm x 11 mm area of prominent bronchiectasis is noted within the posteromedial aspect of the right lower lobe, along an area of central peribronchovascular consolidation seen on the prior exam (axial CT images 58 through 64, CT series 7). A 6 mm area of linear scarring is seen within the posterior aspect of the right lower lobe (axial CT image 53, CT series 7). An 11 mm thin walled cavitary lesion is noted within this region on the prior study. There is no evidence of a pleural effusion or pneumothorax. Upper Abdomen: A percutaneous gastrostomy tube is in place. Musculoskeletal: Numerous chronic bilateral rib fractures are again seen. Stable chronic compression fracture deformities are seen within the mid and upper thoracic spine. Review of the MIP images confirms the above findings. IMPRESSION: 1. No evidence of pulmonary embolism. 2. Mild bilateral tree-in-bud infiltrates, decreased in severity when compared to the prior study. 3. Dilated and debris filled proximal and mid esophagus without clear visualization of the distal esophageal mass noted on the prior exam. 4. Numerous chronic bilateral rib fractures and chronic compression fracture deformities within the mid and upper thoracic spine. 5. Percutaneous gastrostomy tube. 6. Aortic atherosclerosis. Aortic Atherosclerosis (ICD10-I70.0). Electronically Signed   By: TVirgina NorfolkM.D.   On: 02/14/2022 19:11   DG Chest 2 View  Result Date:  02/14/2022 CLINICAL DATA:  Chest pain EXAM: CHEST - 2 VIEW COMPARISON:  CT 01/31/2022 FINDINGS: Left chest wall port a catheter is noted with tip in the distal SVC. Heart size appears normal. No pleural effusion or edema. No airspace opacities identified. Unchanged remote healed rib fractures. Remote inferior endplate deformity involving the T7 vertebra appears stable. IMPRESSION: No acute cardiopulmonary abnormalities. Electronically Signed   By: TKerby MoorsM.D.   On: 02/14/2022 12:16    DOrson Eva DO  [facility]  If 7PM-7AM, please contact night-coverage [email or web address] Password [email or web address]11/21/2023, 3:34 PM   LOS: 12 days

## 2022-03-14 NOTE — Progress Notes (Signed)
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 He has frequent falls at home worse in last couple of days. He is dizzy all the time.  He is not doing water flushes in PEG and continues to smoke.  He refuses SNF.  His chemotherapy was placed on hold due to severe weight loss.  He does not accept that he is dying. He wants to remain full code.    PT Comments    Patient able to sit up at bedside without assist and complete several bilateral LE exercises. Patient then able to ambulate in hallway with RW/min guard and no loss of balance, limited mostly due to fatigue and c/o dizziness. Patient left seated EOB per patient request after therapy. Patient will benefit from continued skilled physical therapy in hospital and recommended venue below to increase strength, balance, endurance for safe ADLs and gait.   Recommendations for follow up therapy are one component of a multi-disciplinary discharge planning process, led by the attending physician.  Recommendations may be updated based on patient status, additional functional criteria and insurance authorization.  Follow Up Recommendations  Skilled nursing-short term rehab (<3 hours/day) Can patient physically be transported by private vehicle: Yes   Assistance Recommended at Discharge Set up Supervision/Assistance  Patient can return home with the following A little help with walking and/or transfers;A little help with bathing/dressing/bathroom;Assistance with cooking/housework;Help with stairs or ramp for entrance   Equipment  Recommendations  Rolling walker (2 wheels)    Recommendations for Other Services       Precautions / Restrictions Precautions Precautions: Fall Restrictions Weight Bearing Restrictions: No     Mobility  Bed Mobility Overal bed mobility: Modified Independent Bed Mobility: Supine to Sit     Supine to sit: HOB elevated, Modified independent (Device/Increase time)     General bed mobility comments: slightly increased time    Transfers Overall transfer level: Needs assistance Equipment used: Rolling walker (2 wheels) Transfers: Sit to/from Stand Sit to Stand: Min guard                Ambulation/Gait Ambulation/Gait assistance: Min guard Gait Distance (Feet): 45 Feet Assistive device: Rolling walker (2 wheels) Gait Pattern/deviations: Decreased step length - right, Decreased step length - left, Decreased stride length Gait velocity: decreased     General Gait Details: patient able to ambulate in hallway with RW/min guard and c/o dizziness with no loss of balance   Stairs             Wheelchair Mobility    Modified Rankin (Stroke Patients Only)       Balance Overall balance assessment: Needs assistance Sitting-balance support: Feet supported, No upper extremity supported Sitting balance-Leahy Scale: Good Sitting balance - Comments: seated EOB   Standing balance support: During functional activity, Bilateral upper extremity supported Standing balance-Leahy Scale: Fair Standing balance comment: with RW                            Cognition Arousal/Alertness: Awake/alert Behavior During Therapy: WFL for tasks assessed/performed Overall Cognitive Status: Within Functional Limits for tasks assessed  Exercises General Exercises - Lower Extremity Long Arc Quad: Seated, AROM, Strengthening, Both, 10 reps Hip Flexion/Marching: Seated, AROM, Strengthening, Both, 10 reps Toe Raises:  Seated, AROM, Strengthening, Both, 10 reps Heel Raises: Seated, AROM, Strengthening, Both, 10 reps    General Comments        Pertinent Vitals/Pain Pain Assessment Pain Assessment: No/denies pain    Home Living                          Prior Function            PT Goals (current goals can now be found in the care plan section) Acute Rehab PT Goals Patient Stated Goal: return home after rehab PT Goal Formulation: With patient Time For Goal Achievement: 03/21/22 Potential to Achieve Goals: Good Progress towards PT goals: Progressing toward goals    Frequency    Min 3X/week      PT Plan Current plan remains appropriate    Co-evaluation              AM-PAC PT "6 Clicks" Mobility   Outcome Measure  Help needed turning from your back to your side while in a flat bed without using bedrails?: None Help needed moving from lying on your back to sitting on the side of a flat bed without using bedrails?: None Help needed moving to and from a bed to a chair (including a wheelchair)?: A Little Help needed standing up from a chair using your arms (e.g., wheelchair or bedside chair)?: A Little Help needed to walk in hospital room?: A Little Help needed climbing 3-5 steps with a railing? : A Lot 6 Click Score: 19    End of Session   Activity Tolerance: Patient tolerated treatment well;Patient limited by fatigue Patient left: with call bell/phone within reach;in bed Nurse Communication: Mobility status PT Visit Diagnosis: Other abnormalities of gait and mobility (R26.89);Muscle weakness (generalized) (M62.81);History of falling (Z91.81);Unsteadiness on feet (R26.81)     Time: 6629-4765 PT Time Calculation (min) (ACUTE ONLY): 11 min  Charges:  $Gait Training: 8-22 mins                     Zigmund Gottron, SPT

## 2022-03-14 NOTE — Progress Notes (Addendum)
Nutrition Follow up  DOCUMENTATION CODES:   Severe malnutrition in context of chronic illness  INTERVENTION:  Vital 1.2 @ 60 ml/hr via PEG   Regular diet   NUTRITION DIAGNOSIS:   Severe Malnutrition related to chronic illness (esophageal cancer- Stage IV) as evidenced by severe muscle depletion, severe fat depletion.  GOAL:  Patient will meet greater than or equal to 90% of their needs -ongoing  MONITOR:  PO intake, Labs, Weight trends, TF tolerance, stool frequency   ASSESSMENT: [name], PEG tube, severe malnutrition, and dehydrated at admission.   [name] No other food items consumed. Poor dentition noted.   [name] Change formula to elemental profile and if diarrhea persists will add Banatrol TID via PEG. Discussed with clinical nutrition team leaders  as well as attending team. Advance TF gradually due to refeeding risk.  Medications: thiamine, folic acid, megace, klor-con, florastor.   [name] Unplanned loss of 10% < 3 months which is significant.   [name] Magnesium, potassium and phosphorus being replaced as needed. Regular diet provided with minimal intake per nursing. Able to feed himself. His breakfast tray is here but untouched (2 egg sandwiches, milk, coffee). Talked with nursing.  Continue advancing tube feeding as noted above.    [name] He has been tolerating goal rate. Surgery consulted.  [name] Nursing documented meal intake 50% of breakfast and 25% lunch. Possible discharge once PEG is changed. Disposition SNF.  Tube feeding regimen noted above provides 1728 kcal, 108 grams  of protein, and 1168 ml of H2O.  [name] Patient tolerating tube feeding. Potassium trending down. Talked with nursing and nutrition services staff. He continues to receive a regular diet and order meal selections.[name] Patient has numerous cans of soft drinks and sandwiches which he will not allow staff to throw away.      Latest Ref Rng & Units 03/14/2022    3:12 AM 03/13/2022    6:58 AM 03/12/2022    5:29 AM  BMP  Glucose 70 - 99 mg/dL 74  62  92   BUN 6 - 20 mg/dL '27  25  27   '$ Creatinine 0.61 - 1.24 mg/dL 0.59  0.63  0.62   Sodium 135 - 145 mmol/L 139  138  141   Potassium 3.5 - 5.1 mmol/L 2.9  3.2  3.4   Chloride 98 - 111 mmol/L 115  114  116   CO2 22 - 32 mmol/L '19  20  21   '$ Calcium 8.9 - 10.3 mg/dL 8.4  8.6  8.6       NUTRITION - FOCUSED PHYSICAL EXAM:  Flowsheet Row Most Recent Value  Orbital Region Severe depletion  Upper Arm Region Severe depletion  Thoracic and Lumbar Region Severe depletion  Buccal Region Severe depletion  Temple Region Severe depletion  Clavicle Bone Region Severe depletion  Clavicle and Acromion Bone Region Severe depletion  Dorsal Hand Moderate depletion  Patellar Region Severe depletion  Anterior Thigh Region Severe depletion  Posterior Calf Region Severe depletion  Edema (  RD Assessment) Mild  Hair Reviewed  Eyes Reviewed  Mouth Reviewed  Skin Reviewed  Nails Reviewed      Diet Order:   Diet Order             Diet regular Room service appropriate? Yes; Fluid consistency: Thin  Diet effective now                   EDUCATION NEEDS:  Education needs have been addressed  Skin:  Skin Assessment: Reviewed RN Assessment (PEG tube- 20 french LUQ)  Last BM:  11/20  Height:   Ht Readings from Last 1 Encounters:  03/01/22 '5\' 6"'$  (1.676 m)    Weight:   Wt Readings from Last 1 Encounters:  03/14/22 40 kg  Stable weight  Ideal Body  Weight:   65 kg  BMI:  Body mass index is 14.23 kg/m.  Estimated Nutritional Needs:   Kcal:  1800-2000  Protein:  88-100 gr  Fluid:  >1400 ml daily  Colman Cater MS,RD,CSG,LDN Contact: Shea Evans

## 2022-03-15 DIAGNOSIS — R531 Weakness: Secondary | ICD-10-CM

## 2022-03-15 DIAGNOSIS — E870 Hyperosmolality and hypernatremia: Secondary | ICD-10-CM | POA: Diagnosis not present

## 2022-03-15 DIAGNOSIS — R627 Adult failure to thrive: Secondary | ICD-10-CM | POA: Diagnosis not present

## 2022-03-15 DIAGNOSIS — C159 Malignant neoplasm of esophagus, unspecified: Secondary | ICD-10-CM | POA: Diagnosis not present

## 2022-03-15 MED ORDER — POTASSIUM CHLORIDE 20 MEQ PO PACK
40.0000 meq | PACK | Freq: Two times a day (BID) | ORAL | Status: DC
  Administered 2022-03-15 – 2022-03-22 (×15): 40 meq
  Filled 2022-03-15 (×16): qty 2

## 2022-03-15 NOTE — Progress Notes (Signed)
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 He has frequent falls at home worse in last couple of days. He is dizzy all the time.  He is not doing water flushes in PEG and continues to smoke.  He refuses SNF.  His chemotherapy was placed on hold due to severe weight loss.  He does not accept that he is dying. He wants to remain full code.  He demands to be admitted to [facility], adamantly refusing to stay at [facility].  He was noted to have an AKI with severe hypernatremia consistent with worsening volume and nutritional status.  He was agreeable to IV fluids overnight but wants to be discharged home tomorrow.   He again refuses to be admitted to [facility].   As the hospitalization progress,  he ultimately agreed to stay at [facility] after he came to the realization that he would not receive any different care at [facility].  He was started on hypotonic fluid  and free water flushes with improvement in his hypernatremia.   His hospitalization was prolonged due to G-tube malfunction requiring replacement and then dislodgement of his Gtube.  This required IR to place new G-tube.  Subsequently, delay in insurance authorization resulted in further delay.   Assessment/Plan:  Hypernatremia / Severe Dehydration  - He has severe free water deficiency - Receiving D5W infusion (35 ml/hr) and free water (100 ml q4h) -Sodium normalized after supplementing adequate free water and IVF -given 1/2NS boluses also -now just getting free water via PEG -Overall stable.  Continue to maintain adequate hydration. -Very poor prognosis.    Hypotension -pt has had soft BPs for most of his hospitalization -check lactic 1.2 -check PCT 0.13 -rebolused LR and 1/2 NS -started midodrine -am cortisol 4.0 -11/15 1345--97.4--110--16--87/62--99% on 2L -11/16 0800--98.8--98--16--91/70--100% 2L -11/18--BP overall improved with the use of midodrine   G-tube Malfunction -G tube has a hole>>leaking -reconsult general surgery>>placed new Gtube 11/15 -early AM 03/09/22--Gtube "fell out" -discussed with Dr. 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 Delton Coombes - palliative care met with him during last admission and he remained in denial about the severity of illness and wanted to be full code/full scope care  - follow up with Dr. Delton Coombes for ongoing discussion   Frequent Falls - likely exacerbated by severe debility, malnutrition, weight loss - fall precautions recommended - PT ordered recommending SNF  -Waiting insurance authorization.   Failure to thrive  - Pt seems unable to care for self at home - PT>>SNF   Severe protein calorie malnutrition - resume regular diet plus PEG tube feeds  - Phosphorous nadir 1.4 - monitor for refeeding syndrome, recheck Mg, Phos in AM, replete as needed    Refeeding Syndrome - replacing phosphorus and Mg as needed    Hypophosphatemia/hypokalemia - IV replacement and PEG replacement ordered  -continue Kphos once daily  -Continue daily potassium supplementation.  AKI Resolved - prerenal from severe dehydration  -serum creatinine peaked 1.32 -resolved and is stable currently.   Tobacco  -Continue nicotine patch. - counseled patient on tobacco cessation    Chronic nausea and vomiting - secondary to esophageal  malignancy -Continue supportive treatment and as needed antiemetics.   -pt insists upon po intake, will not follow medical advice.   History of cavitary pneumonia - completed a 4 week course of augmentin  - now stable on RA without leukocytosis -Patient is afebrile.   Peripheral neuropathy - secondary to prior chronic alcohol abuse  -Continue the use of gabapentin    Leukocytosis  - pt completed 4 week course of augmentin for cavitary pneumonia treatment -now due to stress demargination -afebrile and resolved with tx of acute medical issues     Family Communication: none available  Consultants: Oncology, general surgery, IR, palliative  Code Status:  FULL   DVT Prophylaxis:  Xarelto   Procedures: As Listed in Progress Note Above  Antibiotics: None   Subjective: Chronically ill in appearance; cachectic and so far tolerating PEG tube feedings.  Continues to have intermittent episodes of vomiting.  No fever, no chest pain, no shortness of breath.  Patient is weak and deconditioned.  Objective: Vitals:   03/14/22 2123 03/15/22 0347 03/15/22 0500 03/15/22 1549  BP: (!) 88/64 96/71  121/83  Pulse: 97 100  80  Resp: _0 Temp: 98.2 F (36.8 C) (!) 97.3 F (36.3 C)  99.2 F (37.3 C)  TempSrc:    Oral  SpO2: 100% 100%  100%  Weight:   38.9 kg   Height:        Intake/Output Summary (Last 24 hours) at 03/15/2022 1700 Last data filed at 03/15/2022 0500 Gross per 24 hour  Intake 1024 ml  Output 100 ml  Net 924 ml   Weight change: -1.1 kg Exam: General exam: Alert, awake, oriented x 3; chronically ill in appearance; cachectic and in no acute distress currently. Respiratory system: Clear to auscultation. Respiratory effort normal.  Not requiring oxygen supplementation. Cardiovascular system:RRR. No rubs or gallops. Gastrointestinal system: Abdomen is nondistended, soft and nontender. No organomegaly or masses felt.  Positive bowel sounds appreciated.  PEG tube  in place. Central nervous system: Alert and oriented. No focal neurological deficits. Extremities: No cyanosis or clubbing; no edema. Skin: No petechiae. Psychiatry: Judgement and insight appear normal. Mood & affect appropriate.    Data Reviewed: I have personally reviewed following labs and imaging studies  Basic Metabolic Panel: Recent Labs  Lab 03/09/22 0445 03/11/22 0527 03/12/22 0529 03/13/22 0658 03/14/22 0312  NA 135 144 141 138 139  K 3.9 3.0* 3.4* 3.2* 2.9*  CL 110 116* 116* 114* 115*  CO2 22 22 21* 20* 19*  GLUCOSE 87 102* 92 62* 74  BUN 23* 28* 27* 25* 27*  CREATININE 0.64 0.70 0.62 0.63 0.59*  CALCIUM 7.8* 9.0 8.6* 8.6* 8.4*  MG 1.8 2.0 1.9 1.9 1.9  PHOS 1.4* 2.8 2.2* 2.2* 2.7    CBC: Recent Labs  Lab 03/09/22 0445 03/11/22 0527  WBC 5.7 8.7  HGB 7.6* 8.7*  HCT 23.8* 27.5*  MCV 81.2 83.3  PLT 257 434*    CBG: Recent Labs  Lab 03/10/22 0740 03/10/22 1650  GLUCAP 70 99   Urine analysis:    Component Value Date/Time   COLORURINE AMBER (A) 02/16/2022 0500   APPEARANCEUR CLOUDY (A) 02/16/2022 0500   LABSPEC >1.046 (H) 02/16/2022 0500   PHURINE  5.0 02/16/2022 0500   GLUCOSEU NEGATIVE 02/16/2022 0500   HGBUR NEGATIVE 02/16/2022 0500   BILIRUBINUR SMALL (A) 02/16/2022 0500   KETONESUR NEGATIVE 02/16/2022 0500   PROTEINUR 30 (A) 02/16/2022 0500   NITRITE NEGATIVE 02/16/2022 0500   LEUKOCYTESUR NEGATIVE 02/16/2022 0500   Scheduled Meds:  allopurinol  300 mg Per Tube Daily   Chlorhexidine Gluconate Cloth  6 each Topical Daily   dextrose  0.5 ampule Intravenous Once   folic acid  1 mg Per Tube Daily   free water  100 mL Per Tube Q4H   gabapentin  300 mg Per Tube QHS   megestrol  400 mg Per Tube BID   midodrine  5 mg Per Tube TID WC   phosphorus  500 mg Per Tube Daily   potassium chloride  40 mEq Oral Daily   rivaroxaban  10 mg Per Tube Q supper   thiamine  100 mg Per Tube Daily   Continuous Infusions:  feeding supplement (VITAL AF 1.2  CAL) 1,000 mL (03/13/22 2152)    Procedures/Studies: IR Replc Gastro/Colonic Tube Percut W/Fluoro  Result Date: 03/10/2022 INDICATION: 51 year old male with a history of surgically placed gastrostomy tube which was inadvertently displaced. Tube could not be replaced at bedside at outside hospital so patient now presents to [facility] for tube replacement. EXAM: GASTROSTOMY CATHETER REPLACEMENT MEDICATIONS: Ancef 2 g ANESTHESIA/SEDATION: Versed 1 mg IV; Fentanyl 25 mcg IV Moderate Sedation Time:  9 minutes The patient's vital signs and level of consciousness were continuously monitored during the procedure by the interventional radiology nurse under my direct supervision. CONTRAST:  77m OMNIPAQUE IOHEXOL 300 MG/ML SOLN - administered into the gastric lumen. FLUOROSCOPY TIME:  Radiation exposure index: 1 mGy reference air kerma COMPLICATIONS: None immediate. PROCEDURE: Informed written consent was obtained from the patient after a thorough discussion of the procedural risks, benefits and alternatives. All questions were addressed. Maximal Sterile Barrier Technique was utilized including caps, mask, sterile gowns, sterile gloves, sterile drape, hand hygiene and skin antiseptic. A timeout was performed prior to the initiation of the procedure. The prior gastrostomy tube ostomy was anesthetized by infiltration with 1% lidocaine. Using a hemostat, the overlying scab was debrided. The hemostat was then used to gently open the ostomy tract. A 5 [place] the catheter was navigated through the ostomy and into the stomach. Contrast was injected confirming that the catheter is indeed within the gastric fundus. A wire was placed. The existing ostomy tract was then dilated to 20 [place] into a 256French percutaneous gastrostomy tube was lubricated and advanced over the wire into the stomach. The retention balloon was filled with 15 mL saline and pulled snug against the anterior abdominal  wall. The external bumper was fixed in place. Contrast was injected through the tube confirming its location within the stomach. The tube was then flushed with saline and capped. The patient tolerated the procedure well. IMPRESSION: Successful replacement of 20 French percutaneous gastrostomy tube. Electronically Signed   By: HJacqulynn CadetM.D.   On: 03/10/2022 10:10   DG Chest Portable 1 View  Result Date: 03/02/2022 CLINICAL DATA:  Issues with port EXAM: PORTABLE CHEST 1 VIEW COMPARISON:  02/14/2022, CT 02/14/2022 FINDINGS: Left-sided central venous port tip over the SVC. Tubing appears grossly continuous. No pleural effusion or pneumothorax. Stable cardiomediastinal silhouette. Probable skin fold artifact over the left chest. Multiple old bilateral rib fractures. Mild nodularity in the right upper lobe, likely corresponds to tree-in-bud density on the prior  CT. IMPRESSION: 1. Left-sided central venous port tip over the SVC. Port tubing appears grossly continuous. 2. Mild nodularity in the right upper lobe adjacent to the fissure, likely corresponds to tree-in-bud density on prior CT. Electronically Signed   By: Donavan Foil M.D.   On: 03/02/2022 23:43   CT HEAD WO CONTRAST (5MM)  Result Date: 03/02/2022 CLINICAL DATA:  Head trauma. EXAM: CT HEAD WITHOUT CONTRAST TECHNIQUE: Contiguous axial images were obtained from the base of the skull through the vertex without intravenous contrast. RADIATION DOSE REDUCTION: This exam was performed according to the departmental dose-optimization program which includes automated exposure control, adjustment of the mA and/or kV according to patient size and/or use of iterative reconstruction technique. COMPARISON:  CT Head 02/28/22 FINDINGS: Brain: No evidence of acute infarction, hemorrhage, hydrocephalus, extra-axial collection or mass lesion/mass effect. Sequela of mild chronic microvascular ischemic change. Vascular: No hyperdense vessel or unexpected  calcification. Skull: Normal. Negative for fracture or focal lesion. Sinuses/Orbits: No acute finding. Other: None IMPRESSION: No acute intracranial abnormality. Electronically Signed   By: Marin Roberts M.D.   On: 03/02/2022 19:51   DG Knee 2 Views Left  Result Date: 03/01/2022 CLINICAL DATA:  Trauma EXAM: LEFT KNEE - 1-2 VIEW COMPARISON:  None Available. FINDINGS: Trace knee joint effusion. No fracture. No dislocation. Mild enthesopathic changes of the patella. Sclerotic lesion in the fibula has well-circumscribed margins and is favored to be benign in the absence of other known osseous metastases. No discernible soft tissue abnormality. IMPRESSION: Trace knee joint effusion. No fracture or dislocation. Electronically Signed   By: Marin Roberts M.D.   On: 03/01/2022 08:21   DG Knee 2 Views Right  Result Date: 03/01/2022 CLINICAL DATA:  Fall with patellar pain. EXAM: RIGHT KNEE - 1-2 VIEW COMPARISON:  None Available. FINDINGS: No evidence of regional fracture. No weight-bearing compartment degenerative disease. 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 No other finding of note. IMPRESSION: No acute or traumatic finding. 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 Electronically Signed   By: Nelson Chimes M.D.   On: 03/01/2022 08:18   CT Head Wo Contrast  Result Date: 02/28/2022 CLINICAL DATA:  Head trauma, moderate to severe. [place] getting out of bed with laceration to left eyebrow. EXAM: CT HEAD WITHOUT CONTRAST TECHNIQUE: Contiguous axial images were obtained from the base of the skull through the vertex without intravenous contrast. RADIATION DOSE REDUCTION: This exam was performed according to the departmental dose-optimization program which includes automated exposure control, adjustment of the mA and/or kV according to patient size and/or use of iterative reconstruction technique. COMPARISON:  None. FINDINGS: Brain: No acute intracranial hemorrhage, midline shift or mass effect.  No extra-axial fluid collection. Diffuse atrophy is noted. Periventricular white matter hypodensities are noted bilaterally. No hydrocephalus. Vascular: There is atherosclerotic calcification of the carotid siphons. No hyperdense vessel. Skull: Normal. Negative for fracture or focal lesion. Sinuses/Orbits: No acute finding. Other: None. IMPRESSION: 1. No acute intracranial process. 2. Atrophy with chronic microvascular ischemic changes. Electronically Signed   By: Brett Fairy M.D.   On: 02/28/2022 03:40   CT Angio Chest PE W and/or Wo Contrast  Result Date: 02/14/2022 CLINICAL DATA:  Intermittent chest pain x3 days. EXAM: CT ANGIOGRAPHY CHEST WITH CONTRAST TECHNIQUE: Multidetector CT imaging of the chest was performed using the standard protocol during bolus administration of intravenous contrast. Multiplanar CT image reconstructions and MIPs were obtained to evaluate the vascular anatomy. RADIATION DOSE REDUCTION: This exam was performed according to the departmental dose-optimization program  which includes automated exposure control, adjustment of the mA and/or kV according to patient size and/or use of iterative reconstruction technique. CONTRAST:  64m OMNIPAQUE IOHEXOL 350 MG/ML SOLN COMPARISON:  January 31, 2022 FINDINGS: Cardiovascular: A left-sided venous Port-A-Cath is noted. There is mild calcification of the aortic arch. The thoracic aorta is otherwise normal in appearance. Satisfactory opacification of the pulmonary arteries to the segmental level. No evidence of pulmonary embolism. Normal heart size with mild coronary artery calcification. No pericardial effusion. Mediastinum/Nodes: No enlarged mediastinal, hilar, or axillary lymph nodes. A dilated and debris filled proximal and mid esophagus is again seen. The distal esophageal mass noted on the prior exam is poorly visualized on the current study. The thyroid gland and trachea demonstrate no significant findings. Lungs/Pleura: Mild lingular,  right upper lobe, right middle lobe and right lower lobe tree-in-bud infiltrates are seen. Mild posterior left lower lobe tree-in-bud infiltrate is also noted. These areas are decreased in severity when compared to the prior study. A 17 mm x 11 mm area of prominent bronchiectasis is noted within the posteromedial aspect of the right lower lobe, along an area of central peribronchovascular consolidation seen on the prior exam (axial CT images 58 through 64, CT series 7). A 6 mm area of linear scarring is seen within the posterior aspect of the right lower lobe (axial CT image 53, CT series 7). An 11 mm thin walled cavitary lesion is noted within this region on the prior study. There is no evidence of a pleural effusion or pneumothorax. Upper Abdomen: A percutaneous gastrostomy tube is in place. Musculoskeletal: Numerous chronic bilateral rib fractures are again seen. Stable chronic compression fracture deformities are seen within the mid and upper thoracic spine. Review of the MIP images confirms the above findings. IMPRESSION: 1. No evidence of pulmonary embolism. 2. Mild bilateral tree-in-bud infiltrates, decreased in severity when compared to the prior study. 3. Dilated and debris filled proximal and mid esophagus without clear visualization of the distal esophageal mass noted on the prior exam. 4. Numerous chronic bilateral rib fractures and chronic compression fracture deformities within the mid and upper thoracic spine. 5. Percutaneous gastrostomy tube. 6. Aortic atherosclerosis. Aortic Atherosclerosis (ICD10-I70.0). Electronically Signed   By: TVirgina NorfolkM.D.   On: 02/14/2022 19:11   DG Chest 2 View  Result Date: 02/14/2022 CLINICAL DATA:  Chest pain EXAM: CHEST - 2 VIEW COMPARISON:  CT 01/31/2022 FINDINGS: Left chest wall port a catheter is noted with tip in the distal SVC. Heart size appears normal. No pleural effusion or edema. No airspace opacities identified. Unchanged remote healed rib  fractures. Remote inferior endplate deformity involving the T7 vertebra appears stable. IMPRESSION: No acute cardiopulmonary abnormalities. Electronically Signed   By: TKerby MoorsM.D.   On: 02/14/2022 12:16    CBarton Dubois MD  [facility]  Time: 35 minutes.  If 7PM-7AM, please contact night-coverage [email or web address] Password [facility]   LOS: 13 days

## 2022-03-15 NOTE — Progress Notes (Signed)
Dr. Heber [place] called in for speaker phone visit with pt to talk about current condition and prognosis. Pt remains adamant that he is going to gain 125 pounds so that he can resume his cancer treatment. Pt states that he is going to continue his tube feeding and that his brother will be providing other means of additional liquid nutrition. MD Heber [place] states that he will come see the patient in person on Monday 04/19/2022 unless the patient goes to rehab before then, at which point he can come to the office and follow up with Dr. Raliegh Ip. Pt agreeable.

## 2022-03-16 DIAGNOSIS — E870 Hyperosmolality and hypernatremia: Secondary | ICD-10-CM | POA: Diagnosis not present

## 2022-03-16 DIAGNOSIS — C159 Malignant neoplasm of esophagus, unspecified: Secondary | ICD-10-CM | POA: Diagnosis not present

## 2022-03-16 DIAGNOSIS — R531 Weakness: Secondary | ICD-10-CM | POA: Diagnosis not present

## 2022-03-16 DIAGNOSIS — R627 Adult failure to thrive: Secondary | ICD-10-CM | POA: Diagnosis not present

## 2022-03-16 MED ORDER — PANTOPRAZOLE SODIUM 40 MG PO TBEC
40.0000 mg | DELAYED_RELEASE_TABLET | Freq: Every day | ORAL | Status: DC
  Administered 2022-03-16 – 2022-03-22 (×7): 40 mg via ORAL
  Filled 2022-03-16 (×8): qty 1

## 2022-03-16 NOTE — Progress Notes (Signed)
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 He has frequent falls at home worse in last couple of days. He is dizzy all the time.  He is not doing water flushes in PEG and continues to smoke.  He refuses SNF.  His chemotherapy was placed on hold due to severe weight loss.  He does not accept that he is dying. He wants to remain full code.  He demands to be admitted to [facility], adamantly refusing to stay at [facility].  He was noted to have an AKI with severe hypernatremia consistent with worsening volume and nutritional status.  He was agreeable to IV fluids overnight but wants to be discharged home tomorrow.   He again refuses to be admitted to [facility].   As the hospitalization progress,  he ultimately agreed to stay at [facility] after he came to the realization that he would not receive any different care at [facility].  He was started on hypotonic fluid  and free water flushes with improvement in his hypernatremia.   His hospitalization was prolonged due to G-tube malfunction requiring replacement and then dislodgement of his Gtube.  This required IR to place new G-tube.  Subsequently, delay in insurance authorization resulted in further delay.   Currently hemodynamically stable, so far tolerating G-tube feedings and in no acute distress.  Insurance authorization is still pending.  Assessment/Plan:  Hypernatremia / Severe Dehydration  - He has severe free water deficiency - Receiving D5W infusion (35 ml/hr) and free water (100 ml q4h) -Sodium normalized after supplementing adequate free water and IVF -given 1/2NS boluses  also -now just getting free water via PEG -Overall stable.  Continue to maintain adequate hydration. -Very poor prognosis.   Hypotension -pt has had soft BPs for most of his hospitalization -check lactic 1.2 -check PCT 0.13 -rebolused LR and 1/2 NS -started midodrine -am cortisol 4.0 -11/15 1345--97.4--110--16--87/62--99% on 2L -11/16 0800--98.8--98--16--91/70--100% 2L -11/18--BP overall improved with the use of midodrine   G-tube Malfunction -G tube has a hole>>leaking -reconsult general surgery>>placed new Gtube 11/15 -early AM 03/09/22--Gtube "fell out" -discussed with Dr. 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 Delton Coombes - palliative care met with him during last admission and he remained in denial about the severity of illness and wanted to be full code/full scope care  - follow up with Dr. Delton Coombes for ongoing discussion   Frequent Falls - likely exacerbated by severe debility, malnutrition, weight loss - fall precautions recommended - PT ordered recommending SNF  -Waiting insurance authorization.   Failure to thrive  - Pt seems unable to care for self at home - PT>>SNF   Severe protein calorie malnutrition - resume regular diet plus PEG tube feeds  - Phosphorous nadir 1.4 - monitor for refeeding syndrome, recheck Mg, Phos in AM, replete as needed    Refeeding Syndrome - replacing phosphorus and Mg as needed  Hypophosphatemia/hypokalemia - IV replacement and PEG replacement ordered  -continue Kphos once daily  -Continue daily potassium supplementation.   AKI Resolved - prerenal from severe dehydration  -serum creatinine peaked 1.32 -resolved and is stable currently.   Tobacco   -Continue nicotine patch. - counseled patient on tobacco cessation    Chronic nausea and vomiting - secondary to esophageal malignancy -Continue supportive treatment and as needed antiemetics.   -pt insists upon po intake, will not follow medical advice.   History of cavitary pneumonia - completed a 4 week course of augmentin  - now stable on RA without leukocytosis -Patient is afebrile.   Peripheral neuropathy - secondary to prior chronic alcohol abuse  -Continue the use of gabapentin    Leukocytosis  - pt completed 4 week course of augmentin for cavitary pneumonia treatment -now due to stress demargination -afebrile and resolved with tx of acute medical issues     Family Communication: none available  Consultants: Oncology, general surgery, IR, palliative  Code Status:  FULL   DVT Prophylaxis:  Xarelto   Procedures: As Listed in Progress Note Above  Antibiotics: None   Subjective: Chronically ill in appearance, weak and deconditioned.  So far tolerating PEG tube.  Continues to have intermittent episode of vomiting while trying to eat.  Lack of realistic expectation demonstrated towards staff and consulting providers.  Objective: Vitals:   03/15/22 2139 03/16/22 0445 03/16/22 0500 03/16/22 1352  BP: 112/72 96/67  94/64  Pulse: (!) 101 99  91  Resp: _0 Temp: (!) 97.2 F (36.2 C) 97.9 F (36.6 C)  99.2 F (37.3 C)  TempSrc:    Oral  SpO2: 100% 100%  100%  Weight:   43.4 kg   Height:        Intake/Output Summary (Last 24 hours) at 03/16/2022 1623 Last data filed at 03/16/2022 1124 Gross per 24 hour  Intake 480 ml  Output 300 ml  Net 180 ml   Weight change: 4.5 kg Exam: General exam: Alert, awake, oriented x 3; cachectic and chronically ill in appearance.  No fever, in no major distress currently. Respiratory system: Good saturation on room air; no using accessory muscles.  Positive scattered rhonchi. Cardiovascular system:RRR. No rubs or  gallops. Gastrointestinal system: Abdomen is nondistended, soft and nontender. No organomegaly or masses felt. Normal bowel sounds heard.  PEG tube in place. Central nervous system: Alert and oriented. No focal neurological deficits. Extremities: No cyanosis, clubbing or edema. Skin: No petechiae. Psychiatry: Judgement and insight appear normal. Mood & affect appropriate.    Data Reviewed: I have personally reviewed following labs and imaging studies  Basic Metabolic Panel: Recent Labs  Lab 03/11/22 0527 03/12/22 0529 03/13/22 0658 03/14/22 0312  NA 144 141 138 139  K 3.0* 3.4* 3.2* 2.9*  CL 116* 116* 114* 115*  CO2 22 21* 20* 19*  GLUCOSE 102* 92 62* 74  BUN 28* 27* 25* 27*  CREATININE 0.70 0.62 0.63 0.59*  CALCIUM 9.0 8.6* 8.6* 8.4*  MG 2.0 1.9 1.9 1.9  PHOS 2.8 2.2* 2.2* 2.7    CBC: Recent Labs  Lab 03/11/22 0527  WBC 8.7  HGB 8.7*  HCT 27.5*  MCV 83.3  PLT 434*    CBG: Recent Labs  Lab 03/10/22 0740 03/10/22 1650  GLUCAP 70 99   Urine analysis:    Component Value Date/Time   COLORURINE AMBER (A) 02/16/2022 0500   APPEARANCEUR CLOUDY (A) 02/16/2022 0500   LABSPEC >1.046 (H) 02/16/2022  0500   PHURINE 5.0 02/16/2022 0500   GLUCOSEU NEGATIVE 02/16/2022 0500   HGBUR NEGATIVE 02/16/2022 0500   BILIRUBINUR SMALL (A) 02/16/2022 0500   KETONESUR NEGATIVE 02/16/2022 0500   PROTEINUR 30 (A) 02/16/2022 0500   NITRITE NEGATIVE 02/16/2022 0500   LEUKOCYTESUR NEGATIVE 02/16/2022 0500   Scheduled Meds:  allopurinol  300 mg Per Tube Daily   Chlorhexidine Gluconate Cloth  6 each Topical Daily   dextrose  0.5 ampule Intravenous Once   folic acid  1 mg Per Tube Daily   free water  100 mL Per Tube Q4H   gabapentin  300 mg Per Tube QHS   megestrol  400 mg Per Tube BID   midodrine  5 mg Per Tube TID WC   phosphorus  500 mg Per Tube Daily   potassium chloride  40 mEq Per Tube BID   rivaroxaban  10 mg Per Tube Q supper   thiamine  100 mg Per Tube Daily    Continuous Infusions:  feeding supplement (VITAL AF 1.2 CAL) 1,000 mL (03/15/22 1906)    Procedures/Studies: IR Replc Gastro/Colonic Tube Percut W/Fluoro  Result Date: 03/10/2022 INDICATION: 51 year old male with a history of surgically placed gastrostomy tube which was inadvertently displaced. Tube could not be replaced at bedside at outside hospital so patient now presents to [facility] interventional radiology for tube replacement. EXAM: GASTROSTOMY CATHETER REPLACEMENT MEDICATIONS: Ancef 2 g ANESTHESIA/SEDATION: Versed 1 mg IV; Fentanyl 25 mcg IV Moderate Sedation Time:  9 minutes The patient's vital signs and level of consciousness were continuously monitored during the procedure by the interventional radiology nurse under my direct supervision. CONTRAST:  11m OMNIPAQUE IOHEXOL 300 MG/ML SOLN - administered into the gastric lumen. FLUOROSCOPY TIME:  Radiation exposure index: 1 mGy reference air kerma COMPLICATIONS: None immediate. PROCEDURE: Informed written consent was obtained from the patient after a thorough discussion of the procedural risks, benefits and alternatives. All questions were addressed. Maximal Sterile Barrier Technique was utilized including caps, mask, sterile gowns, sterile gloves, sterile drape, hand hygiene and skin antiseptic. A timeout was performed prior to the initiation of the procedure. The prior gastrostomy tube ostomy was anesthetized by infiltration with 1% lidocaine. Using a hemostat, the overlying scab was debrided. The hemostat was then used to gently open the ostomy tract. A 5 [place] the catheter was navigated through the ostomy and into the stomach. Contrast was injected confirming that the catheter is indeed within the gastric fundus. A wire was placed. The existing ostomy tract was then dilated to 20 [place] into a 228French percutaneous gastrostomy tube was lubricated and advanced over the wire into the stomach. The retention balloon was filled with  15 mL saline and pulled snug against the anterior abdominal wall. The external bumper was fixed in place. Contrast was injected through the tube confirming its location within the stomach. The tube was then flushed with saline and capped. The patient tolerated the procedure well. IMPRESSION: Successful replacement of 20 French percutaneous gastrostomy tube. Electronically Signed   By: HJacqulynn CadetM.D.   On: 03/10/2022 10:10   DG Chest Portable 1 View  Result Date: 03/02/2022 CLINICAL DATA:  Issues with port EXAM: PORTABLE CHEST 1 VIEW COMPARISON:  02/14/2022, CT 02/14/2022 FINDINGS: Left-sided central venous port tip over the SVC. Tubing appears grossly continuous. No pleural effusion or pneumothorax. Stable cardiomediastinal silhouette. Probable skin fold artifact over the left chest. Multiple old bilateral rib fractures. Mild nodularity in the right upper lobe, likely corresponds to  tree-in-bud density on the prior CT. IMPRESSION: 1. Left-sided central venous port tip over the SVC. Port tubing appears grossly continuous. 2. Mild nodularity in the right upper lobe adjacent to the fissure, likely corresponds to tree-in-bud density on prior CT. Electronically Signed   By: Donavan Foil M.D.   On: 03/02/2022 23:43   CT HEAD WO CONTRAST (5MM)  Result Date: 03/02/2022 CLINICAL DATA:  Head trauma. EXAM: CT HEAD WITHOUT CONTRAST TECHNIQUE: Contiguous axial images were obtained from the base of the skull through the vertex without intravenous contrast. RADIATION DOSE REDUCTION: This exam was performed according to the departmental dose-optimization program which includes automated exposure control, adjustment of the mA and/or kV according to patient size and/or use of iterative reconstruction technique. COMPARISON:  CT Head 02/28/22 FINDINGS: Brain: No evidence of acute infarction, hemorrhage, hydrocephalus, extra-axial collection or mass lesion/mass effect. Sequela of mild chronic microvascular ischemic  change. Vascular: No hyperdense vessel or unexpected calcification. Skull: Normal. Negative for fracture or focal lesion. Sinuses/Orbits: No acute finding. Other: None IMPRESSION: No acute intracranial abnormality. Electronically Signed   By: Marin Roberts M.D.   On: 03/02/2022 19:51   DG Knee 2 Views Left  Result Date: 03/01/2022 CLINICAL DATA:  Trauma EXAM: LEFT KNEE - 1-2 VIEW COMPARISON:  None Available. FINDINGS: Trace knee joint effusion. No fracture. No dislocation. Mild enthesopathic changes of the patella. Sclerotic lesion in the fibula has well-circumscribed margins and is favored to be benign in the absence of other known osseous metastases. No discernible soft tissue abnormality. IMPRESSION: Trace knee joint effusion. No fracture or dislocation. Electronically Signed   By: Marin Roberts M.D.   On: 03/01/2022 08:21   DG Knee 2 Views Right  Result Date: 03/01/2022 CLINICAL DATA:  Fall with patellar pain. EXAM: RIGHT KNEE - 1-2 VIEW COMPARISON:  None Available. FINDINGS: No evidence of regional fracture. No weight-bearing compartment degenerative disease. 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 No other finding of note. IMPRESSION: No acute or traumatic finding. 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 Electronically Signed   By: Nelson Chimes M.D.   On: 03/01/2022 08:18   CT Head Wo Contrast  Result Date: 02/28/2022 CLINICAL DATA:  Head trauma, moderate to severe. [place] getting out of bed with laceration to left eyebrow. EXAM: CT HEAD WITHOUT CONTRAST TECHNIQUE: Contiguous axial images were obtained from the base of the skull through the vertex without intravenous contrast. RADIATION DOSE REDUCTION: This exam was performed according to the departmental dose-optimization program which includes automated exposure control, adjustment of the mA and/or kV according to patient size and/or use of iterative reconstruction technique. COMPARISON:  None. FINDINGS: Brain: No acute  intracranial hemorrhage, midline shift or mass effect. No extra-axial fluid collection. Diffuse atrophy is noted. Periventricular white matter hypodensities are noted bilaterally. No hydrocephalus. Vascular: There is atherosclerotic calcification of the carotid siphons. No hyperdense vessel. Skull: Normal. Negative for fracture or focal lesion. Sinuses/Orbits: No acute finding. Other: None. IMPRESSION: 1. No acute intracranial process. 2. Atrophy with chronic microvascular ischemic changes. Electronically Signed   By: Brett Fairy M.D.   On: 02/28/2022 03:40   CT Angio Chest PE W and/or Wo Contrast  Result Date: 02/14/2022 CLINICAL DATA:  Intermittent chest pain x3 days. EXAM: CT ANGIOGRAPHY CHEST WITH CONTRAST TECHNIQUE: Multidetector CT imaging of the chest was performed using the standard protocol during bolus administration of intravenous contrast. Multiplanar CT image reconstructions and MIPs were obtained to evaluate the vascular anatomy. RADIATION DOSE REDUCTION: This exam was performed according  to the departmental dose-optimization program which includes automated exposure control, adjustment of the mA and/or kV according to patient size and/or use of iterative reconstruction technique. CONTRAST:  86m OMNIPAQUE IOHEXOL 350 MG/ML SOLN COMPARISON:  January 31, 2022 FINDINGS: Cardiovascular: A left-sided venous Port-A-Cath is noted. There is mild calcification of the aortic arch. The thoracic aorta is otherwise normal in appearance. Satisfactory opacification of the pulmonary arteries to the segmental level. No evidence of pulmonary embolism. Normal heart size with mild coronary artery calcification. No pericardial effusion. Mediastinum/Nodes: No enlarged mediastinal, hilar, or axillary lymph nodes. A dilated and debris filled proximal and mid esophagus is again seen. The distal esophageal mass noted on the prior exam is poorly visualized on the current study. The thyroid gland and trachea demonstrate  no significant findings. Lungs/Pleura: Mild lingular, right upper lobe, right middle lobe and right lower lobe tree-in-bud infiltrates are seen. Mild posterior left lower lobe tree-in-bud infiltrate is also noted. These areas are decreased in severity when compared to the prior study. A 17 mm x 11 mm area of prominent bronchiectasis is noted within the posteromedial aspect of the right lower lobe, along an area of central peribronchovascular consolidation seen on the prior exam (axial CT images 58 through 64, CT series 7). A 6 mm area of linear scarring is seen within the posterior aspect of the right lower lobe (axial CT image 53, CT series 7). An 11 mm thin walled cavitary lesion is noted within this region on the prior study. There is no evidence of a pleural effusion or pneumothorax. Upper Abdomen: A percutaneous gastrostomy tube is in place. Musculoskeletal: Numerous chronic bilateral rib fractures are again seen. Stable chronic compression fracture deformities are seen within the mid and upper thoracic spine. Review of the MIP images confirms the above findings. IMPRESSION: 1. No evidence of pulmonary embolism. 2. Mild bilateral tree-in-bud infiltrates, decreased in severity when compared to the prior study. 3. Dilated and debris filled proximal and mid esophagus without clear visualization of the distal esophageal mass noted on the prior exam. 4. Numerous chronic bilateral rib fractures and chronic compression fracture deformities within the mid and upper thoracic spine. 5. Percutaneous gastrostomy tube. 6. Aortic atherosclerosis. Aortic Atherosclerosis (ICD10-I70.0). Electronically Signed   By: TVirgina NorfolkM.D.   On: 02/14/2022 19:11    CBarton Dubois MD  [facility]  Time: 35 minutes.  If 7PM-7AM, please contact night-coverage [email or web address] Password [email or web address]   LOS: 14 days

## 2022-03-16 NOTE — Progress Notes (Signed)
Patient slept on and off throughout this shift. No complaints of pain or discomfort at this time. Patient very pleasant during care and interaction with staff.

## 2022-03-17 DIAGNOSIS — E870 Hyperosmolality and hypernatremia: Secondary | ICD-10-CM | POA: Diagnosis not present

## 2022-03-17 DIAGNOSIS — R531 Weakness: Secondary | ICD-10-CM | POA: Diagnosis not present

## 2022-03-17 DIAGNOSIS — C159 Malignant neoplasm of esophagus, unspecified: Secondary | ICD-10-CM | POA: Diagnosis not present

## 2022-03-17 DIAGNOSIS — R627 Adult failure to thrive: Secondary | ICD-10-CM | POA: Diagnosis not present

## 2022-03-17 NOTE — Progress Notes (Signed)
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 He has frequent falls at home worse in last couple of days. He is dizzy all the time.  He is not doing water flushes in PEG and continues to smoke.  He refuses SNF.  His chemotherapy was placed on hold due to severe weight loss.  He does not accept that he is dying. He wants to remain full code.  He demands to be admitted to [facility], adamantly refusing to stay at [facility].  He was noted to have an AKI with severe hypernatremia consistent with worsening volume and nutritional status.  He was agreeable to IV fluids overnight but wants to be discharged home tomorrow.   He again refuses to be admitted to [facility].   As the hospitalization progress,  he ultimately agreed to stay at [facility] after he came to the realization that he would not receive any different care at [facility].  He was started on hypotonic fluid  and free water flushes with improvement in his hypernatremia.   His hospitalization was prolonged due to G-tube malfunction requiring replacement and then dislodgement of his Gtube.  This required IR to place new G-tube.  Subsequently, delay in insurance authorization resulted in further delay.   Currently hemodynamically stable, so far tolerating G-tube feedings and in no acute distress.  Insurance authorization is still pending.  Assessment/Plan:  Hypernatremia / Severe Dehydration  - He has severe free water deficiency - Receiving D5W infusion (35 ml/hr) and free water (100 ml q4h) -Sodium normalized after supplementing adequate free water and IVF -given 1/2NS boluses  also -now just getting free water via PEG -Overall stable.  Continue to maintain adequate hydration. -Very poor prognosis.   Hypotension -pt has had soft BPs for most of his hospitalization -check lactic 1.2 -check PCT 0.13 -rebolused LR and 1/2 NS -started midodrine -am cortisol 4.0 -11/15 1345--97.4--110--16--87/62--99% on 2L -11/16 0800--98.8--98--16--91/70--100% 2L -11/18--BP overall improved with the use of midodrine   G-tube Malfunction -G tube has a hole>>leaking -reconsult general surgery>>placed new Gtube 11/15 -early AM 03/09/22--Gtube "fell out" -discussed with Dr. 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 Delton Coombes - palliative care met with him during last admission and he remained in denial about the severity of illness and wanted to be full code/full scope care  - follow up with Dr. Delton Coombes for ongoing discussion   Frequent Falls - likely exacerbated by severe debility, malnutrition, weight loss - fall precautions recommended - PT ordered recommending SNF  -Waiting insurance authorization.   Failure to thrive  - Pt seems unable to care for self at home - PT>>SNF   Severe protein calorie malnutrition - resume regular diet plus PEG tube feeds  - Phosphorous nadir 1.4 - monitor for refeeding syndrome, recheck Mg, Phos in AM, replete as needed    Refeeding Syndrome - replacing phosphorus and Mg as needed  Hypophosphatemia/hypokalemia - IV replacement and PEG replacement ordered  -continue Kphos once daily  -Continue daily potassium supplementation.   AKI Resolved - prerenal from severe dehydration  -serum creatinine peaked 1.32 -resolved and is stable currently.   Tobacco   -Continue nicotine patch. - counseled patient on tobacco cessation    Chronic nausea and vomiting - secondary to esophageal malignancy -Continue supportive treatment and as needed antiemetics.   -pt insists upon po intake, will not follow medical advice.   History of cavitary pneumonia - completed a 4 week course of augmentin  - now stable on RA without leukocytosis -Patient is afebrile.   Peripheral neuropathy - secondary to prior chronic alcohol abuse  -Continue the use of gabapentin    Leukocytosis  - pt completed 4 week course of augmentin for cavitary pneumonia treatment -now due to stress demargination -afebrile and resolved with tx of acute medical issues     Family Communication: none available  Consultants: Oncology, general surgery, IR, palliative  Code Status:  FULL   DVT Prophylaxis:  Xarelto   Procedures: As Listed in Progress Note Above  Antibiotics: None   Subjective: Continues to tolerate PEG tube feedings; no chest pain, no shortness of breath.  Good saturation on room air.  Continue intermittent episodes of vomiting while eating.  Reports no nausea at time of assessment.  Objective: Vitals:   03/16/22 0500 03/16/22 1352 03/16/22 2138 03/17/22 0500  BP:  94/64 99/74 (!) 87/65  Pulse:  91 87 79  Resp:  _0 Temp:  99.2 F (37.3 C) 98 F (36.7 C) 98 F (36.7 C)  TempSrc:  Oral    SpO2:  100% 100% 100%  Weight: 43.4 kg     Height:        Intake/Output Summary (Last 24 hours) at 03/17/2022 1704 Last data filed at 03/17/2022 1300 Gross per 24 hour  Intake 1080 ml  Output 3 ml  Net 1077 ml   Weight change:  Exam: General exam: Alert, awake, oriented x 3; commands appropriately.  No overnight events. Respiratory system: Clear to auscultation. Respiratory effort normal.  Good saturation on room air. Cardiovascular system:RRR. No rubs or gallops. Gastrointestinal system: Abdomen is nondistended, soft and nontender. No organomegaly  or masses felt. Normal bowel sounds heard.  PEG tube in place. Central nervous system: Alert and oriented. No focal neurological deficits. Extremities: No cyanosis or clubbing. Skin: No petechiae. Psychiatry: Judgement and insight appear normal. Mood & affect appropriate.   Data Reviewed: I have personally reviewed following labs and imaging studies  Basic Metabolic Panel: Recent Labs  Lab 03/11/22 0527 03/12/22 0529 03/13/22 0658 03/14/22 0312  NA 144 141 138 139  K 3.0* 3.4* 3.2* 2.9*  CL 116* 116* 114* 115*  CO2 22 21* 20* 19*  GLUCOSE 102* 92 62* 74  BUN 28* 27* 25* 27*  CREATININE 0.70 0.62 0.63 0.59*  CALCIUM 9.0 8.6* 8.6* 8.4*  MG 2.0 1.9 1.9 1.9  PHOS 2.8 2.2* 2.2* 2.7    CBC: Recent Labs  Lab 03/11/22 0527  WBC 8.7  HGB 8.7*  HCT 27.5*  MCV 83.3  PLT 434*    CBG: No results for input(s): "GLUCAP" in the last 168 hours.  Urine analysis:    Component Value Date/Time   COLORURINE AMBER (A) 02/16/2022 0500   APPEARANCEUR CLOUDY (A) 02/16/2022 0500   LABSPEC >1.046 (H) 02/16/2022 0500   PHURINE 5.0 02/16/2022 0500   GLUCOSEU NEGATIVE 02/16/2022 0500   HGBUR NEGATIVE  02/16/2022 0500   BILIRUBINUR SMALL (A) 02/16/2022 0500   KETONESUR NEGATIVE 02/16/2022 0500   PROTEINUR 30 (A) 02/16/2022 0500   NITRITE NEGATIVE 02/16/2022 0500   LEUKOCYTESUR NEGATIVE 02/16/2022 0500   Scheduled Meds:  allopurinol  300 mg Per Tube Daily   Chlorhexidine Gluconate Cloth  6 each Topical Daily   dextrose  0.5 ampule Intravenous Once   folic acid  1 mg Per Tube Daily   free water  100 mL Per Tube Q4H   gabapentin  300 mg Per Tube QHS   megestrol  400 mg Per Tube BID   midodrine  5 mg Per Tube TID WC   pantoprazole  40 mg Oral Daily   phosphorus  500 mg Per Tube Daily   potassium chloride  40 mEq Per Tube BID   rivaroxaban  10 mg Per Tube Q supper   thiamine  100 mg Per Tube Daily   Continuous Infusions:  feeding supplement (VITAL AF 1.2 CAL) 1,000 mL (03/16/22  1737)    Procedures/Studies: IR Replc Gastro/Colonic Tube Percut W/Fluoro  Result Date: 03/10/2022 INDICATION: 51 year old male with a history of surgically placed gastrostomy tube which was inadvertently displaced. Tube could not be replaced at bedside at outside hospital so patient now presents to [facility] interventional radiology for tube replacement. EXAM: GASTROSTOMY CATHETER REPLACEMENT MEDICATIONS: Ancef 2 g ANESTHESIA/SEDATION: Versed 1 mg IV; Fentanyl 25 mcg IV Moderate Sedation Time:  9 minutes The patient's vital signs and level of consciousness were continuously monitored during the procedure by the interventional radiology nurse under my direct supervision. CONTRAST:  36m OMNIPAQUE IOHEXOL 300 MG/ML SOLN - administered into the gastric lumen. FLUOROSCOPY TIME:  Radiation exposure index: 1 mGy reference air kerma COMPLICATIONS: None immediate. PROCEDURE: Informed written consent was obtained from the patient after a thorough discussion of the procedural risks, benefits and alternatives. All questions were addressed. Maximal Sterile Barrier Technique was utilized including caps, mask, sterile gowns, sterile gloves, sterile drape, hand hygiene and skin antiseptic. A timeout was performed prior to the initiation of the procedure. The prior gastrostomy tube ostomy was anesthetized by infiltration with 1% lidocaine. Using a hemostat, the overlying scab was debrided. The hemostat was then used to gently open the ostomy tract. A 5 [place] the catheter was navigated through the ostomy and into the stomach. Contrast was injected confirming that the catheter is indeed within the gastric fundus. A wire was placed. The existing ostomy tract was then dilated to 20 [place] into a 229French percutaneous gastrostomy tube was lubricated and advanced over the wire into the stomach. The retention balloon was filled with 15 mL saline and pulled snug against the anterior abdominal wall. The external bumper  was fixed in place. Contrast was injected through the tube confirming its location within the stomach. The tube was then flushed with saline and capped. The patient tolerated the procedure well. IMPRESSION: Successful replacement of 20 French percutaneous gastrostomy tube. Electronically Signed   By: HJacqulynn CadetM.D.   On: 03/10/2022 10:10   DG Chest Portable 1 View  Result Date: 03/02/2022 CLINICAL DATA:  Issues with port EXAM: PORTABLE CHEST 1 VIEW COMPARISON:  02/14/2022, CT 02/14/2022 FINDINGS: Left-sided central venous port tip over the SVC. Tubing appears grossly continuous. No pleural effusion or pneumothorax. Stable cardiomediastinal silhouette. Probable skin fold artifact over the left chest. Multiple old bilateral rib fractures. Mild nodularity in the right upper lobe, likely corresponds to tree-in-bud density on the prior CT. IMPRESSION: 1. Left-sided  central venous port tip over the SVC. Port tubing appears grossly continuous. 2. Mild nodularity in the right upper lobe adjacent to the fissure, likely corresponds to tree-in-bud density on prior CT. Electronically Signed   By: Donavan Foil M.D.   On: 03/02/2022 23:43   CT HEAD WO CONTRAST (5MM)  Result Date: 03/02/2022 CLINICAL DATA:  Head trauma. EXAM: CT HEAD WITHOUT CONTRAST TECHNIQUE: Contiguous axial images were obtained from the base of the skull through the vertex without intravenous contrast. RADIATION DOSE REDUCTION: This exam was performed according to the departmental dose-optimization program which includes automated exposure control, adjustment of the mA and/or kV according to patient size and/or use of iterative reconstruction technique. COMPARISON:  CT Head 02/28/22 FINDINGS: Brain: No evidence of acute infarction, hemorrhage, hydrocephalus, extra-axial collection or mass lesion/mass effect. Sequela of mild chronic microvascular ischemic change. Vascular: No hyperdense vessel or unexpected calcification. Skull: Normal. Negative  for fracture or focal lesion. Sinuses/Orbits: No acute finding. Other: None IMPRESSION: No acute intracranial abnormality. Electronically Signed   By: Marin Roberts M.D.   On: 03/02/2022 19:51   DG Knee 2 Views Left  Result Date: 03/01/2022 CLINICAL DATA:  Trauma EXAM: LEFT KNEE - 1-2 VIEW COMPARISON:  None Available. FINDINGS: Trace knee joint effusion. No fracture. No dislocation. Mild enthesopathic changes of the patella. Sclerotic lesion in the fibula has well-circumscribed margins and is favored to be benign in the absence of other known osseous metastases. No discernible soft tissue abnormality. IMPRESSION: Trace knee joint effusion. No fracture or dislocation. Electronically Signed   By: Marin Roberts M.D.   On: 03/01/2022 08:21   DG Knee 2 Views Right  Result Date: 03/01/2022 CLINICAL DATA:  Fall with patellar pain. EXAM: RIGHT KNEE - 1-2 VIEW COMPARISON:  None Available. FINDINGS: No evidence of regional fracture. No weight-bearing compartment degenerative disease. 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 No other finding of note. IMPRESSION: No acute or traumatic finding. 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 Electronically Signed   By: Nelson Chimes M.D.   On: 03/01/2022 08:18   CT Head Wo Contrast  Result Date: 02/28/2022 CLINICAL DATA:  Head trauma, moderate to severe. [place] getting out of bed with laceration to left eyebrow. EXAM: CT HEAD WITHOUT CONTRAST TECHNIQUE: Contiguous axial images were obtained from the base of the skull through the vertex without intravenous contrast. RADIATION DOSE REDUCTION: This exam was performed according to the departmental dose-optimization program which includes automated exposure control, adjustment of the mA and/or kV according to patient size and/or use of iterative reconstruction technique. COMPARISON:  None. FINDINGS: Brain: No acute intracranial hemorrhage, midline shift or mass effect. No extra-axial fluid collection.  Diffuse atrophy is noted. Periventricular white matter hypodensities are noted bilaterally. No hydrocephalus. Vascular: There is atherosclerotic calcification of the carotid siphons. No hyperdense vessel. Skull: Normal. Negative for fracture or focal lesion. Sinuses/Orbits: No acute finding. Other: None. IMPRESSION: 1. No acute intracranial process. 2. Atrophy with chronic microvascular ischemic changes. Electronically Signed   By: Brett Fairy M.D.   On: 02/28/2022 03:40    Barton Dubois, MD  [facility]  Time: 35 minutes.  If 7PM-7AM, please contact night-coverage [email or web address] Password [facility] 03/17/2022   LOS: 15 days

## 2022-03-17 NOTE — TOC Progression Note (Signed)
Transition of Care [facility]) - Progression Note    Patient Details  Name: Camarion Weier MRN: [id number] Date of Birth: 29-Jun-1970  [facility] CM/SW Contact  Salome Arnt,  Phone Number: 03/17/2022, 10:08 AM  Clinical Narrative: Per Jackelyn Poling at [facility], [name] still pending. MD updated.       Expected Discharge Plan: [place] Barriers to Discharge: Insurance Authorization  Expected Discharge Plan and Services Expected Discharge Plan: [place] In-house Referral: Clinical Social Work Discharge Planning Services: CM Consult Post Acute Care Choice: Bright Living arrangements for the past 2 months: Apartment Expected Discharge Date: 03/03/22                                     Social Determinants of Health (SDOH) Interventions Housing Interventions: Intervention Not Indicated  Readmission Risk Interventions    03/03/2022   11:46 AM 01/24/2022    3:13 PM  Readmission Risk Prevention Plan  Transportation Screening Complete Complete  PCP or Specialist Appt within 3-5 Days  Complete  HRI or [place]  Complete  Social Work Consult for [place] Planning/Counseling  Complete  Palliative Care Screening  Complete  Medication Review Press photographer) Complete Complete  HRI or [place] Complete   SW Recovery Care/Counseling Consult Complete   Palliative Care Screening Complete   [place] Patient Refused

## 2022-03-18 DIAGNOSIS — R627 Adult failure to thrive: Secondary | ICD-10-CM | POA: Diagnosis not present

## 2022-03-18 DIAGNOSIS — C159 Malignant neoplasm of esophagus, unspecified: Secondary | ICD-10-CM | POA: Diagnosis not present

## 2022-03-18 DIAGNOSIS — E870 Hyperosmolality and hypernatremia: Secondary | ICD-10-CM | POA: Diagnosis not present

## 2022-03-18 DIAGNOSIS — R531 Weakness: Secondary | ICD-10-CM | POA: Diagnosis not present

## 2022-03-18 NOTE — Progress Notes (Signed)
Patient slept on and off throughout shift. When the patient is spoken to he is very irritable, not friendly. Patient refused CHG bath this morning. Patient requested 12 packs of sugar, 2 sodas, 2 cups of ice chips, and an [place] ice this morning.

## 2022-03-18 NOTE — Progress Notes (Signed)
Pt continues to request food in bulk, pt will chew the food and then stick his fingers in his mouth to clear the food and expel into the trash can, pt will vomit several times throughout eating regular food. Pt explained that this can cause aspiration and educated on the danger. Pt states he will continue to do what he wants, expresses he is trying to get back up to 105lb for chemo. Pt refuses CHG bath and bed change at this time, will continue to monitor and offer hygiene.

## 2022-03-18 NOTE — Progress Notes (Signed)
CSW spoke with Jackelyn Poling at [place] at [place] who states insurance is requesting new therapy notes to support insurance authorization request which is still pending at this time.  Madilyn Fireman, MSW, LCSW Transitions of Care  Clinical Social Worker II [phone]

## 2022-03-18 NOTE — Progress Notes (Signed)
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 He has frequent falls at home worse in last couple of days. He is dizzy all the time.  He is not doing water flushes in PEG and continues to smoke.  He refuses SNF.  His chemotherapy was placed on hold due to severe weight loss.  He does not accept that he is dying. He wants to remain full code.  He demands to be admitted to [facility], adamantly refusing to stay at [facility].  He was noted to have an AKI with severe hypernatremia consistent with worsening volume and nutritional status.  He was agreeable to IV fluids overnight but wants to be discharged home tomorrow.   He again refuses to be admitted to [facility].   As the hospitalization progress,  he ultimately agreed to stay at [facility] after he came to the realization that he would not receive any different care at [facility].  He was started on hypotonic fluid  and free water flushes with improvement in his hypernatremia.   His hospitalization was prolonged due to G-tube malfunction requiring replacement and then dislodgement of his Gtube.  This required IR to place new G-tube.  Subsequently, delay in insurance authorization resulted in further delay.   Currently hemodynamically stable, so far tolerating G-tube feedings and in no acute distress.  Insurance authorization is still pending.  Assessment/Plan:  Hypernatremia / Severe Dehydration  - He has severe free water deficiency - Receiving D5W infusion (35 ml/hr) and free water (100 ml q4h) -Sodium normalized after supplementing adequate free water and IVF -given 1/2NS boluses  also -now just getting free water via PEG -Overall stable.  Continue to maintain adequate hydration. -Very poor prognosis.   Hypotension -pt has had soft BPs for most of his hospitalization -check lactic 1.2 -check PCT 0.13 -rebolused LR and 1/2 NS -started midodrine -am cortisol 4.0 -11/15 1345--97.4--110--16--87/62--99% on 2L -11/16 0800--98.8--98--16--91/70--100% 2L -11/18--BP overall improved with the use of midodrine   G-tube Malfunction -G tube has a hole>>leaking -reconsult general surgery>>placed new Gtube 11/15 -early AM 03/09/22--Gtube "fell out" -discussed with Dr. 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 Delton Coombes - palliative care met with him during last admission and he remained in denial about the severity of illness and wanted to be full code/full scope care  - follow up with Dr. Delton Coombes for ongoing discussion   Frequent Falls - likely exacerbated by severe debility, malnutrition, weight loss - fall precautions recommended - PT ordered recommending SNF  -Waiting insurance authorization.   Failure to thrive  - Pt seems unable to care for self at home - PT>>SNF   Severe protein calorie malnutrition - resume regular diet plus PEG tube feeds  - Phosphorous nadir 1.4 - monitor for refeeding syndrome, recheck Mg, Phos in AM, replete as needed    Refeeding Syndrome - replacing phosphorus and Mg as needed  Hypophosphatemia/hypokalemia - IV replacement and PEG replacement ordered  -continue Kphos once daily  -Continue daily potassium supplementation.   AKI Resolved - prerenal from severe dehydration  -serum creatinine peaked 1.32 -resolved and is stable currently.   Tobacco   -Continue nicotine patch. - counseled patient on tobacco cessation    Chronic nausea and vomiting - secondary to esophageal malignancy -Continue supportive treatment and as needed antiemetics.   -pt insists upon po intake, will not follow medical advice.   History of cavitary pneumonia - completed a 4 week course of augmentin  - now stable on RA without leukocytosis -Patient is afebrile.   Peripheral neuropathy - secondary to prior chronic alcohol abuse  -Continue the use of gabapentin    Leukocytosis  - pt completed 4 week course of augmentin for cavitary pneumonia treatment -now due to stress demargination -afebrile and resolved with tx of acute medical issues     Family Communication: none available  Consultants: Oncology, general surgery, IR, palliative  Code Status:  FULL   DVT Prophylaxis:  Xarelto   Procedures: As Listed in Progress Note Above  Antibiotics: None   Subjective: Continue tolerating PEG tube feedings; no chest pain, no nausea, no vomiting.  Weak and physically deconditioned.  Hemodynamically stable and waiting for insurance authorization prior to nursing home discharge for further care and rehab.  Objective: Vitals:   03/17/22 0500 03/17/22 2058 03/18/22 0500 03/18/22 0518  BP: (!) 87/65 92/69  102/83  Pulse: 79 93  (!) 110  Resp: _0 Temp: 98 F (36.7 C) 98 F (36.7 C)  98 F (36.7 C)  TempSrc:  Oral  Oral  SpO2: 100% 100%  100%  Weight:   37.8 kg   Height:        Intake/Output Summary (Last 24 hours) at 03/18/2022 1113 Last data filed at 03/18/2022 0458 Gross per 24 hour  Intake 480 ml  Output 50 ml  Net 430 ml   Weight change:  Exam: General exam: Alert, awake, oriented x 3; weak and deconditioned; chronically ill in appearance.  So far tolerating tube feeding through PEG tube.  No overnight events. Respiratory system: Clear to auscultation. Respiratory effort normal.  Good saturation on room air. Cardiovascular  system:RRR. No rubs or gallops.  No JVD. Gastrointestinal system: Abdomen is nondistended, soft and nontender. No organomegaly or masses felt. Normal bowel sounds heard.  PEG tube in place. Central nervous system: Alert and oriented. No focal neurological deficits. Extremities: No cyanosis or clubbing. Skin: No petechiae. Psychiatry: Judgement and insight appear normal. Mood & affect appropriate.   Data Reviewed: I have personally reviewed following labs and imaging studies  Basic Metabolic Panel: Recent Labs  Lab 03/12/22 0529 03/13/22 0658 03/14/22 0312  NA 141 138 139  K 3.4* 3.2* 2.9*  CL 116* 114* 115*  CO2 21* 20* 19*  GLUCOSE 92 62* 74  BUN 27* 25* 27*  CREATININE 0.62 0.63 0.59*  CALCIUM 8.6* 8.6* 8.4*  MG 1.9 1.9 1.9  PHOS 2.2* 2.2* 2.7    CBC: No results for input(s): "WBC", "NEUTROABS", "HGB", "HCT", "MCV", "PLT" in the last 168 hours.   CBG: No results for input(s): "GLUCAP" in the last 168 hours.  Urine analysis:    Component Value Date/Time   COLORURINE AMBER (A) 02/16/2022 0500   APPEARANCEUR CLOUDY (A) 02/16/2022 0500   LABSPEC >1.046 (H) 02/16/2022 0500   PHURINE 5.0 02/16/2022 0500   GLUCOSEU NEGATIVE 02/16/2022 0500   HGBUR NEGATIVE 02/16/2022  0500   BILIRUBINUR SMALL (A) 02/16/2022 0500   KETONESUR NEGATIVE 02/16/2022 0500   PROTEINUR 30 (A) 02/16/2022 0500   NITRITE NEGATIVE 02/16/2022 0500   LEUKOCYTESUR NEGATIVE 02/16/2022 0500   Scheduled Meds:  allopurinol  300 mg Per Tube Daily   Chlorhexidine Gluconate Cloth  6 each Topical Daily   dextrose  0.5 ampule Intravenous Once   folic acid  1 mg Per Tube Daily   free water  100 mL Per Tube Q4H   gabapentin  300 mg Per Tube QHS   megestrol  400 mg Per Tube BID   midodrine  5 mg Per Tube TID WC   pantoprazole  40 mg Oral Daily   phosphorus  500 mg Per Tube Daily   potassium chloride  40 mEq Per Tube BID   rivaroxaban  10 mg Per Tube Q supper   thiamine  100 mg Per Tube Daily    Continuous Infusions:  feeding supplement (VITAL AF 1.2 CAL) 1,000 mL (03/16/22 1737)    Procedures/Studies: IR Replc Gastro/Colonic Tube Percut W/Fluoro  Result Date: 03/10/2022 INDICATION: 51 year old male with a history of surgically placed gastrostomy tube which was inadvertently displaced. Tube could not be replaced at bedside at outside hospital so patient now presents to [facility] interventional radiology for tube replacement. EXAM: GASTROSTOMY CATHETER REPLACEMENT MEDICATIONS: Ancef 2 g ANESTHESIA/SEDATION: Versed 1 mg IV; Fentanyl 25 mcg IV Moderate Sedation Time:  9 minutes The patient's vital signs and level of consciousness were continuously monitored during the procedure by the interventional radiology nurse under my direct supervision. CONTRAST:  54m OMNIPAQUE IOHEXOL 300 MG/ML SOLN - administered into the gastric lumen. FLUOROSCOPY TIME:  Radiation exposure index: 1 mGy reference air kerma COMPLICATIONS: None immediate. PROCEDURE: Informed written consent was obtained from the patient after a thorough discussion of the procedural risks, benefits and alternatives. All questions were addressed. Maximal Sterile Barrier Technique was utilized including caps, mask, sterile gowns, sterile gloves, sterile drape, hand hygiene and skin antiseptic. A timeout was performed prior to the initiation of the procedure. The prior gastrostomy tube ostomy was anesthetized by infiltration with 1% lidocaine. Using a hemostat, the overlying scab was debrided. The hemostat was then used to gently open the ostomy tract. A 5 [place] the catheter was navigated through the ostomy and into the stomach. Contrast was injected confirming that the catheter is indeed within the gastric fundus. A wire was placed. The existing ostomy tract was then dilated to 20 [place] into a 252French percutaneous gastrostomy tube was lubricated and advanced over the wire into the stomach. The retention balloon was filled with  15 mL saline and pulled snug against the anterior abdominal wall. The external bumper was fixed in place. Contrast was injected through the tube confirming its location within the stomach. The tube was then flushed with saline and capped. The patient tolerated the procedure well. IMPRESSION: Successful replacement of 20 French percutaneous gastrostomy tube. Electronically Signed   By: HJacqulynn CadetM.D.   On: 03/10/2022 10:10   DG Chest Portable 1 View  Result Date: 03/02/2022 CLINICAL DATA:  Issues with port EXAM: PORTABLE CHEST 1 VIEW COMPARISON:  02/14/2022, CT 02/14/2022 FINDINGS: Left-sided central venous port tip over the SVC. Tubing appears grossly continuous. No pleural effusion or pneumothorax. Stable cardiomediastinal silhouette. Probable skin fold artifact over the left chest. Multiple old bilateral rib fractures. Mild nodularity in the right upper lobe, likely corresponds to tree-in-bud density on the prior CT. IMPRESSION: 1. Left-sided central  venous port tip over the SVC. Port tubing appears grossly continuous. 2. Mild nodularity in the right upper lobe adjacent to the fissure, likely corresponds to tree-in-bud density on prior CT. Electronically Signed   By: Donavan Foil M.D.   On: 03/02/2022 23:43   CT HEAD WO CONTRAST (5MM)  Result Date: 03/02/2022 CLINICAL DATA:  Head trauma. EXAM: CT HEAD WITHOUT CONTRAST TECHNIQUE: Contiguous axial images were obtained from the base of the skull through the vertex without intravenous contrast. RADIATION DOSE REDUCTION: This exam was performed according to the departmental dose-optimization program which includes automated exposure control, adjustment of the mA and/or kV according to patient size and/or use of iterative reconstruction technique. COMPARISON:  CT Head 02/28/22 FINDINGS: Brain: No evidence of acute infarction, hemorrhage, hydrocephalus, extra-axial collection or mass lesion/mass effect. Sequela of mild chronic microvascular ischemic  change. Vascular: No hyperdense vessel or unexpected calcification. Skull: Normal. Negative for fracture or focal lesion. Sinuses/Orbits: No acute finding. Other: None IMPRESSION: No acute intracranial abnormality. Electronically Signed   By: Marin Roberts M.D.   On: 03/02/2022 19:51   DG Knee 2 Views Left  Result Date: 03/01/2022 CLINICAL DATA:  Trauma EXAM: LEFT KNEE - 1-2 VIEW COMPARISON:  None Available. FINDINGS: Trace knee joint effusion. No fracture. No dislocation. Mild enthesopathic changes of the patella. Sclerotic lesion in the fibula has well-circumscribed margins and is favored to be benign in the absence of other known osseous metastases. No discernible soft tissue abnormality. IMPRESSION: Trace knee joint effusion. No fracture or dislocation. Electronically Signed   By: Marin Roberts M.D.   On: 03/01/2022 08:21   DG Knee 2 Views Right  Result Date: 03/01/2022 CLINICAL DATA:  Fall with patellar pain. EXAM: RIGHT KNEE - 1-2 VIEW COMPARISON:  None Available. FINDINGS: No evidence of regional fracture. No weight-bearing compartment degenerative disease. 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 No other finding of note. IMPRESSION: No acute or traumatic finding. 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 Electronically Signed   By: Nelson Chimes M.D.   On: 03/01/2022 08:18   CT Head Wo Contrast  Result Date: 02/28/2022 CLINICAL DATA:  Head trauma, moderate to severe. [place] getting out of bed with laceration to left eyebrow. EXAM: CT HEAD WITHOUT CONTRAST TECHNIQUE: Contiguous axial images were obtained from the base of the skull through the vertex without intravenous contrast. RADIATION DOSE REDUCTION: This exam was performed according to the departmental dose-optimization program which includes automated exposure control, adjustment of the mA and/or kV according to patient size and/or use of iterative reconstruction technique. COMPARISON:  None. FINDINGS: Brain: No acute  intracranial hemorrhage, midline shift or mass effect. No extra-axial fluid collection. Diffuse atrophy is noted. Periventricular white matter hypodensities are noted bilaterally. No hydrocephalus. Vascular: There is atherosclerotic calcification of the carotid siphons. No hyperdense vessel. Skull: Normal. Negative for fracture or focal lesion. Sinuses/Orbits: No acute finding. Other: None. IMPRESSION: 1. No acute intracranial process. 2. Atrophy with chronic microvascular ischemic changes. Electronically Signed   By: Brett Fairy M.D.   On: 02/28/2022 03:40    Barton Dubois, MD  [facility]  Time: 35 minutes.  If 7PM-7AM, please contact night-coverage [email or web address] Password [facility] 03/18/2022   LOS: 16 days

## 2022-03-19 DIAGNOSIS — R531 Weakness: Secondary | ICD-10-CM | POA: Diagnosis not present

## 2022-03-19 DIAGNOSIS — C159 Malignant neoplasm of esophagus, unspecified: Secondary | ICD-10-CM | POA: Diagnosis not present

## 2022-03-19 DIAGNOSIS — E870 Hyperosmolality and hypernatremia: Secondary | ICD-10-CM | POA: Diagnosis not present

## 2022-03-19 DIAGNOSIS — R627 Adult failure to thrive: Secondary | ICD-10-CM | POA: Diagnosis not present

## 2022-03-19 NOTE — Progress Notes (Signed)
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 He has frequent falls at home worse in last couple of days. He is dizzy all the time.  He is not doing water flushes in PEG and continues to smoke.  He refuses SNF.  His chemotherapy was placed on hold due to severe weight loss.  He does not accept that he is dying. He wants to remain full code.  He demands to be admitted to [facility], adamantly refusing to stay at [facility].  He was noted to have an AKI with severe hypernatremia consistent with worsening volume and nutritional status.  He was agreeable to IV fluids overnight but wants to be discharged home tomorrow.   He again refuses to be admitted to [facility].   As the hospitalization progress,  he ultimately agreed to stay at [facility] after he came to the realization that he would not receive any different care at [facility].  He was started on hypotonic fluid  and free water flushes with improvement in his hypernatremia.   His hospitalization was prolonged due to G-tube malfunction requiring replacement and then dislodgement of his Gtube.  This required IR to place new G-tube.  Subsequently, delay in insurance authorization resulted in further delay.   Currently hemodynamically stable, so far tolerating G-tube feedings and in no acute distress.  Insurance authorization is still pending.  Assessment/Plan:  Hypernatremia / Severe Dehydration  - He has severe free water deficiency - Receiving D5W infusion (35 ml/hr) and free water (100 ml q4h) -Sodium normalized after supplementing adequate free water and IVF -given 1/2NS boluses  also -now just getting free water via PEG -Overall stable.  Continue to maintain adequate hydration. -Very poor prognosis.   Hypotension -pt has had soft BPs for most of his hospitalization -check lactic 1.2 -check PCT 0.13 -rebolused LR and 1/2 NS -started midodrine -am cortisol 4.0 -11/15 1345--97.4--110--16--87/62--99% on 2L -11/16 0800--98.8--98--16--91/70--100% 2L -11/18--BP overall improved with the use of midodrine   G-tube Malfunction -G tube has a hole>>leaking -reconsult general surgery>>placed new Gtube 11/15 -early AM 03/09/22--Gtube "fell out" -discussed with Dr. 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 Delton Coombes - palliative care met with him during last admission and he remained in denial about the severity of illness and wanted to be full code/full scope care  - follow up with Dr. Delton Coombes for ongoing discussion   Frequent Falls - likely exacerbated by severe debility, malnutrition, weight loss - fall precautions recommended - PT ordered recommending SNF  -Waiting insurance authorization.   Failure to thrive  - Pt seems unable to care for self at home - PT>>SNF   Severe protein calorie malnutrition - resume regular diet plus PEG tube feeds  - Phosphorous nadir 1.4 - monitor for refeeding syndrome, recheck Mg, Phos in AM, replete as needed    Refeeding Syndrome - replacing phosphorus and Mg as needed  Hypophosphatemia/hypokalemia - IV replacement and PEG replacement ordered  -continue Kphos once daily  -Continue daily potassium supplementation.   AKI Resolved - prerenal from severe dehydration  -serum creatinine peaked 1.32 -resolved and is stable currently.   Tobacco   -Continue nicotine patch. - counseled patient on tobacco cessation    Chronic nausea and vomiting - secondary to esophageal malignancy -Continue supportive treatment and as needed antiemetics.   -pt insists upon po intake, will not follow medical advice.   History of cavitary pneumonia - completed a 4 week course of augmentin  - now stable on RA without leukocytosis -Patient is afebrile.   Peripheral neuropathy - secondary to prior chronic alcohol abuse  -Continue the use of gabapentin    Leukocytosis  - pt completed 4 week course of augmentin for cavitary pneumonia treatment -now due to stress demargination -afebrile and resolved with tx of acute medical issues     Family Communication: none available  Consultants: Oncology, general surgery, IR, palliative  Code Status:  FULL   DVT Prophylaxis:  Xarelto   Procedures: As Listed in Progress Note Above  Antibiotics: None   Subjective: Patient has continued later in PEG tube feedings; continue having intermittent episode of vomiting and inducing vomiting after eating certain foods.  Has been requesting double portion on his meals and is otherwise hemodynamically stable and in no acute distress.  Chronically ill, weak, deconditioned and cachectic.  Objective: Vitals:   03/18/22 1542 03/18/22 2158 03/19/22 0426 03/19/22 0500  BP: (!) 126/90 102/78 97/69   Pulse: (!) 104 75 100   Resp: _0 Temp: 98.3 F (36.8 C) 98.4 F (36.9 C) 98.9 F (37.2 C)   TempSrc: Oral     SpO2: 100% 100% 100%   Weight:    39.8 kg  Height:        Intake/Output Summary (Last 24 hours) at 03/19/2022 0806 Last data filed at 03/19/2022 0500 Gross per 24 hour  Intake 440 ml  Output 200 ml  Net 240 ml   Weight change: 2.015 kg Exam: General exam: Alert, awake, oriented x 3; no chest pain, no fever, good saturation on room air.  Continues to have intermittent episodes of vomiting after swallowing certain food.  Weak and  deconditioned. Respiratory system: No using accessory muscles; normal respiratory effort.  No wheezing or crackles. Cardiovascular system:RRR. No rubs or gallops. Gastrointestinal system: Abdomen is nondistended, soft and nontender. No organomegaly or masses felt. Normal bowel sounds heard.  PEG tube in place. Central nervous system: Alert and oriented. No focal neurological deficits. Extremities: No cyanosis or clubbing. Skin: No petechiae. Psychiatry: Mood & affect appropriate.    Data Reviewed: I have personally reviewed following labs and imaging studies  Basic Metabolic Panel: Recent Labs  Lab 03/13/22 0658 03/14/22 0312  NA 138 139  K 3.2* 2.9*  CL 114* 115*  CO2 20* 19*  GLUCOSE 62* 74  BUN 25* 27*  CREATININE 0.63 0.59*  CALCIUM 8.6* 8.4*  MG 1.9 1.9  PHOS 2.2* 2.7    CBC: No results for input(s): "WBC", "NEUTROABS", "HGB", "HCT", "MCV", "PLT" in the last 168 hours.   CBG: No results for input(s): "GLUCAP" in the last 168 hours.  Urine analysis:    Component Value Date/Time   COLORURINE AMBER (A) 02/16/2022 0500   APPEARANCEUR CLOUDY (A) 02/16/2022 0500   LABSPEC >1.046 (H) 02/16/2022 0500   PHURINE 5.0 02/16/2022 0500   GLUCOSEU NEGATIVE 02/16/2022 0500   HGBUR NEGATIVE 02/16/2022  0500   BILIRUBINUR SMALL (A) 02/16/2022 0500   KETONESUR NEGATIVE 02/16/2022 0500   PROTEINUR 30 (A) 02/16/2022 0500   NITRITE NEGATIVE 02/16/2022 0500   LEUKOCYTESUR NEGATIVE 02/16/2022 0500   Scheduled Meds:  allopurinol  300 mg Per Tube Daily   Chlorhexidine Gluconate Cloth  6 each Topical Daily   dextrose  0.5 ampule Intravenous Once   folic acid  1 mg Per Tube Daily   free water  100 mL Per Tube Q4H   gabapentin  300 mg Per Tube QHS   megestrol  400 mg Per Tube BID   midodrine  5 mg Per Tube TID WC   pantoprazole  40 mg Oral Daily   phosphorus  500 mg Per Tube Daily   potassium chloride  40 mEq Per Tube BID   rivaroxaban  10 mg Per Tube Q supper   thiamine  100  mg Per Tube Daily   Continuous Infusions:  feeding supplement (VITAL AF 1.2 CAL) 1,000 mL (03/16/22 1737)    Procedures/Studies: IR Replc Gastro/Colonic Tube Percut W/Fluoro  Result Date: 03/10/2022 INDICATION: 51 year old male with a history of surgically placed gastrostomy tube which was inadvertently displaced. Tube could not be replaced at bedside at outside hospital so patient now presents to [facility] interventional radiology for tube replacement. EXAM: GASTROSTOMY CATHETER REPLACEMENT MEDICATIONS: Ancef 2 g ANESTHESIA/SEDATION: Versed 1 mg IV; Fentanyl 25 mcg IV Moderate Sedation Time:  9 minutes The patient's vital signs and level of consciousness were continuously monitored during the procedure by the interventional radiology nurse under my direct supervision. CONTRAST:  55m OMNIPAQUE IOHEXOL 300 MG/ML SOLN - administered into the gastric lumen. FLUOROSCOPY TIME:  Radiation exposure index: 1 mGy reference air kerma COMPLICATIONS: None immediate. PROCEDURE: Informed written consent was obtained from the patient after a thorough discussion of the procedural risks, benefits and alternatives. All questions were addressed. Maximal Sterile Barrier Technique was utilized including caps, mask, sterile gowns, sterile gloves, sterile drape, hand hygiene and skin antiseptic. A timeout was performed prior to the initiation of the procedure. The prior gastrostomy tube ostomy was anesthetized by infiltration with 1% lidocaine. Using a hemostat, the overlying scab was debrided. The hemostat was then used to gently open the ostomy tract. A 5 [place] the catheter was navigated through the ostomy and into the stomach. Contrast was injected confirming that the catheter is indeed within the gastric fundus. A wire was placed. The existing ostomy tract was then dilated to 20 [place] into a 250French percutaneous gastrostomy tube was lubricated and advanced over the wire into the stomach. The retention balloon  was filled with 15 mL saline and pulled snug against the anterior abdominal wall. The external bumper was fixed in place. Contrast was injected through the tube confirming its location within the stomach. The tube was then flushed with saline and capped. The patient tolerated the procedure well. IMPRESSION: Successful replacement of 20 French percutaneous gastrostomy tube. Electronically Signed   By: HJacqulynn CadetM.D.   On: 03/10/2022 10:10   DG Chest Portable 1 View  Result Date: 03/02/2022 CLINICAL DATA:  Issues with port EXAM: PORTABLE CHEST 1 VIEW COMPARISON:  02/14/2022, CT 02/14/2022 FINDINGS: Left-sided central venous port tip over the SVC. Tubing appears grossly continuous. No pleural effusion or pneumothorax. Stable cardiomediastinal silhouette. Probable skin fold artifact over the left chest. Multiple old bilateral rib fractures. Mild nodularity in the right upper lobe, likely corresponds to tree-in-bud density on the prior CT. IMPRESSION: 1. Left-sided central  venous port tip over the SVC. Port tubing appears grossly continuous. 2. Mild nodularity in the right upper lobe adjacent to the fissure, likely corresponds to tree-in-bud density on prior CT. Electronically Signed   By: Donavan Foil M.D.   On: 03/02/2022 23:43   CT HEAD WO CONTRAST (5MM)  Result Date: 03/02/2022 CLINICAL DATA:  Head trauma. EXAM: CT HEAD WITHOUT CONTRAST TECHNIQUE: Contiguous axial images were obtained from the base of the skull through the vertex without intravenous contrast. RADIATION DOSE REDUCTION: This exam was performed according to the departmental dose-optimization program which includes automated exposure control, adjustment of the mA and/or kV according to patient size and/or use of iterative reconstruction technique. COMPARISON:  CT Head 02/28/22 FINDINGS: Brain: No evidence of acute infarction, hemorrhage, hydrocephalus, extra-axial collection or mass lesion/mass effect. Sequela of mild chronic  microvascular ischemic change. Vascular: No hyperdense vessel or unexpected calcification. Skull: Normal. Negative for fracture or focal lesion. Sinuses/Orbits: No acute finding. Other: None IMPRESSION: No acute intracranial abnormality. Electronically Signed   By: Marin Roberts M.D.   On: 03/02/2022 19:51   DG Knee 2 Views Left  Result Date: 03/01/2022 CLINICAL DATA:  Trauma EXAM: LEFT KNEE - 1-2 VIEW COMPARISON:  None Available. FINDINGS: Trace knee joint effusion. No fracture. No dislocation. Mild enthesopathic changes of the patella. Sclerotic lesion in the fibula has well-circumscribed margins and is favored to be benign in the absence of other known osseous metastases. No discernible soft tissue abnormality. IMPRESSION: Trace knee joint effusion. No fracture or dislocation. Electronically Signed   By: Marin Roberts M.D.   On: 03/01/2022 08:21   DG Knee 2 Views Right  Result Date: 03/01/2022 CLINICAL DATA:  Fall with patellar pain. EXAM: RIGHT KNEE - 1-2 VIEW COMPARISON:  None Available. FINDINGS: No evidence of regional fracture. No weight-bearing compartment degenerative disease. 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 No other finding of note. IMPRESSION: No acute or traumatic finding. 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 Electronically Signed   By: Nelson Chimes M.D.   On: 03/01/2022 08:18   CT Head Wo Contrast  Result Date: 02/28/2022 CLINICAL DATA:  Head trauma, moderate to severe. [place] getting out of bed with laceration to left eyebrow. EXAM: CT HEAD WITHOUT CONTRAST TECHNIQUE: Contiguous axial images were obtained from the base of the skull through the vertex without intravenous contrast. RADIATION DOSE REDUCTION: This exam was performed according to the departmental dose-optimization program which includes automated exposure control, adjustment of the mA and/or kV according to patient size and/or use of iterative reconstruction technique. COMPARISON:  None.  FINDINGS: Brain: No acute intracranial hemorrhage, midline shift or mass effect. No extra-axial fluid collection. Diffuse atrophy is noted. Periventricular white matter hypodensities are noted bilaterally. No hydrocephalus. Vascular: There is atherosclerotic calcification of the carotid siphons. No hyperdense vessel. Skull: Normal. Negative for fracture or focal lesion. Sinuses/Orbits: No acute finding. Other: None. IMPRESSION: 1. No acute intracranial process. 2. Atrophy with chronic microvascular ischemic changes. Electronically Signed   By: Brett Fairy M.D.   On: 02/28/2022 03:40    Barton Dubois, MD  [facility]  Time: 35 minutes.  If 7PM-7AM, please contact night-coverage [email or web address] Password [facility] 03/19/2022   LOS: 17 days

## 2022-03-20 DIAGNOSIS — C159 Malignant neoplasm of esophagus, unspecified: Secondary | ICD-10-CM | POA: Diagnosis not present

## 2022-03-20 DIAGNOSIS — R1310 Dysphagia, unspecified: Secondary | ICD-10-CM | POA: Diagnosis not present

## 2022-03-20 DIAGNOSIS — R627 Adult failure to thrive: Secondary | ICD-10-CM | POA: Diagnosis not present

## 2022-03-20 DIAGNOSIS — E870 Hyperosmolality and hypernatremia: Secondary | ICD-10-CM | POA: Diagnosis not present

## 2022-03-20 DIAGNOSIS — R531 Weakness: Secondary | ICD-10-CM | POA: Diagnosis not present

## 2022-03-20 DIAGNOSIS — E43 Unspecified severe protein-calorie malnutrition: Secondary | ICD-10-CM | POA: Diagnosis not present

## 2022-03-20 NOTE — TOC Progression Note (Signed)
Transition of Care [facility]) - Progression Note    Patient Details  Name: Trevor [name] MRN: [id number] Date of Birth: 1970/07/23  Transition of Care [facility]) CM/SW [place], [place] Phone Number: 03/20/2022, 11:54 AM  Clinical Narrative:    CSW spoke to [name] at [facility] to update that new PT notes have been put in. Jackelyn Poling is submitting updated clinicals at this time. TOC to follow.   Expected Discharge Plan: [place] Barriers to Discharge: Insurance Authorization  Expected Discharge Plan and Services Expected Discharge Plan: [place] In-house Referral: Clinical Social Work Discharge Planning Services: CM Consult Post Acute Care Choice: [place] Living arrangements for the past 2 months: Apartment Expected Discharge Date: 03/03/22                                     Social Determinants of Health (SDOH) Interventions Housing Interventions: Intervention Not Indicated  Readmission Risk Interventions    03/03/2022   11:46 AM 01/24/2022    3:13 PM  Readmission Risk Prevention Plan  Transportation Screening Complete Complete  PCP or Specialist Appt within 3-5 Days  Complete  HRI or [place]  Complete  Social Work Consult for [place] Planning/Counseling  Complete  Palliative Care Screening  Complete  Medication Review Press photographer) Complete Complete  HRI or [place] Complete   SW Recovery Care/Counseling Consult Complete   Palliative Care Screening Complete   [place] Patient Refused

## 2022-03-20 NOTE — Progress Notes (Signed)
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 He has frequent falls at home worse in last couple of days. He is dizzy all the time.  He is not doing water flushes in PEG and continues to smoke.  He refuses SNF.  His chemotherapy was placed on hold due to severe weight loss.  He does not accept that he is dying. He wants to remain full code.  He demands to be admitted to [facility], adamantly refusing to stay at [facility].  He was noted to have an AKI with severe hypernatremia consistent with worsening volume and nutritional status.  He was agreeable to IV fluids overnight but wants to be discharged home tomorrow.   He again refuses to be admitted to [facility].   As the hospitalization progress,  he ultimately agreed to stay at [facility] after he came to the realization that he would not receive any different care at [facility].  He was started on hypotonic fluid  and free water flushes with improvement in his hypernatremia.   His hospitalization was prolonged due to G-tube malfunction requiring replacement and then dislodgement of his Gtube.  This required IR to place new G-tube.  Subsequently, delay in insurance authorization resulted in further delay.   Currently remains hemodynamically stable, so far tolerating G-tube feedings and in no acute distress.  Insurance authorization is still pending.  Assessment/Plan:  Hypernatremia / Severe Dehydration  - He has severe free water deficiency - Receiving D5W infusion (35 ml/hr) and free water (100 ml q4h) -Sodium normalized after supplementing adequate free water and IVF -given 1/2NS  boluses also -now just getting free water via PEG -Overall stable.  Continue to maintain adequate hydration. -Very poor prognosis.   Hypotension -pt has had soft BPs for most of his hospitalization -check lactic 1.2 -check PCT 0.13 -rebolused LR and 1/2 NS -started midodrine -am cortisol 4.0 -11/15 1345--97.4--110--16--87/62--99% on 2L -11/16 0800--98.8--98--16--91/70--100% 2L -11/18--BP overall improved with the use of midodrine   G-tube Malfunction -G tube has a hole>>leaking -reconsult general surgery>>placed new Gtube 11/15 -early AM 03/09/22--Gtube "fell out" -discussed with Dr. 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 Delton Coombes - palliative care met with him during last admission and he remained in denial about the severity of illness and wanted to be full code/full scope care  - follow up with Dr. Delton Coombes for ongoing discussion   Frequent Falls - likely exacerbated by severe debility, malnutrition, weight loss - fall precautions recommended - PT ordered recommending SNF  -Waiting insurance authorization.   Failure to thrive  - Pt seems unable to care for self at home - PT>>SNF   Severe protein calorie malnutrition - resume regular diet plus PEG tube feeds  - Phosphorous nadir 1.4 - monitor for refeeding syndrome, recheck Mg, Phos in AM, replete as needed    Refeeding Syndrome - replacing phosphorus and Mg as needed  Hypophosphatemia/hypokalemia - IV replacement and PEG replacement ordered  -continue Kphos once daily  -Continue daily potassium supplementation.   AKI Resolved - prerenal from severe dehydration  -serum creatinine peaked 1.32 -resolved and is stable currently.   Tobacco   -Continue nicotine patch. - counseled patient on tobacco cessation    Chronic nausea and vomiting - secondary to esophageal malignancy -Continue supportive treatment and as needed antiemetics.   -pt insists upon po intake, will not follow medical advice.   History of cavitary pneumonia - completed a 4 week course of augmentin  - now stable on RA without leukocytosis -Patient is afebrile.   Peripheral neuropathy - secondary to prior chronic alcohol abuse  -Continue the use of gabapentin    Leukocytosis  - pt completed 4 week course of augmentin for cavitary pneumonia treatment -now due to stress demargination -afebrile and resolved with tx of acute medical issues     Family Communication: none available  Consultants: Oncology, general surgery, IR, palliative  Code Status:  FULL   DVT Prophylaxis:  Xarelto   Procedures: As Listed in Progress Note Above  Antibiotics: None   Subjective: Cachectic, chronically ill in appearance and demonstrating generalized weakness and deconditioning.  So far tolerating tube feedings.  No overnight events.  Patient is mad that insurance authorization has not been received.  Objective: Vitals:   03/20/22 0429 03/20/22 0455 03/20/22 0500 03/20/22 1401  BP: 96/69 108/64  104/73  Pulse: (!) 102   (!) 101  Resp: 16   20  Temp: 98.7 F (37.1 C)   99.2 F (37.3 C)  TempSrc:    Oral  SpO2: 100%   100%  Weight:   39.4 kg   Height:       No intake or output data in the 24 hours ending 03/20/22 1433  Weight change: -0.4 kg  Exam: General exam: Alert, awake, oriented x 3; weak and deconditioned; Mad that insurance authorization has not been received yet.  Denies fever, chest pain and shortness of breath. Respiratory system: Positive scattered rhonchi; good saturation on room air. Cardiovascular system:RRR. No rubs or gallops; no JVD. Gastrointestinal system: Abdomen is nondistended, soft and nontender. No organomegaly or masses felt.  Normal bowel sounds heard. Central nervous system: No focal neurological deficits. Extremities: No cyanosis or clubbing. Skin: No petechiae. Psychiatry: Mood and affect appropriate.   Data Reviewed: I have personally reviewed following labs and imaging studies  Basic Metabolic Panel: Recent Labs  Lab 03/14/22 0312  NA 139  K 2.9*  CL 115*  CO2 19*  GLUCOSE 74  BUN 27*  CREATININE 0.59*  CALCIUM 8.4*  MG 1.9  PHOS 2.7    CBC: No results for input(s): "WBC", "NEUTROABS", "HGB", "HCT", "MCV", "PLT" in the last 168 hours.   CBG: No results for input(s): "GLUCAP" in the last 168 hours.  Urine analysis:    Component Value Date/Time   COLORURINE AMBER (A) 02/16/2022 0500   APPEARANCEUR CLOUDY (A) 02/16/2022 0500   LABSPEC >1.046 (H) 02/16/2022 0500   PHURINE 5.0 02/16/2022 0500   GLUCOSEU NEGATIVE 02/16/2022 0500   HGBUR NEGATIVE 02/16/2022 0500   BILIRUBINUR SMALL (A) 02/16/2022 0500   KETONESUR NEGATIVE 02/16/2022 0500   PROTEINUR 30 (A) 02/16/2022 0500   NITRITE NEGATIVE 02/16/2022 0500   LEUKOCYTESUR NEGATIVE 02/16/2022 0500   Scheduled Meds:  allopurinol  300 mg Per Tube Daily   Chlorhexidine Gluconate Cloth  6 each Topical Daily   dextrose  0.5 ampule Intravenous Once  folic acid  1 mg Per Tube Daily   free water  100 mL Per Tube Q4H   gabapentin  300 mg Per Tube QHS   megestrol  400 mg Per Tube BID   midodrine  5 mg Per Tube TID WC   pantoprazole  40 mg Oral Daily   phosphorus  500 mg Per Tube Daily   potassium chloride  40 mEq Per Tube BID   rivaroxaban  10 mg Per Tube Q supper   thiamine  100 mg Per Tube Daily   Continuous Infusions:  feeding supplement (VITAL AF 1.2 CAL) 1,000 mL (03/20/22 0729)    Procedures/Studies: IR Replc Gastro/Colonic Tube Percut W/Fluoro  Result Date: 03/10/2022 INDICATION: 51 year old male with a history of surgically placed gastrostomy tube which was inadvertently displaced. Tube could not be replaced at bedside at  outside hospital so patient now presents to [facility] cone interventional radiology for tube replacement. EXAM: GASTROSTOMY CATHETER REPLACEMENT MEDICATIONS: Ancef 2 g ANESTHESIA/SEDATION: Versed 1 mg IV; Fentanyl 25 mcg IV Moderate Sedation Time:  9 minutes The patient's vital signs and level of consciousness were continuously monitored during the procedure by the interventional radiology nurse under my direct supervision. CONTRAST:  1m OMNIPAQUE IOHEXOL 300 MG/ML SOLN - administered into the gastric lumen. FLUOROSCOPY TIME:  Radiation exposure index: 1 mGy reference air kerma COMPLICATIONS: None immediate. PROCEDURE: Informed written consent was obtained from the patient after a thorough discussion of the procedural risks, benefits and alternatives. All questions were addressed. Maximal Sterile Barrier Technique was utilized including caps, mask, sterile gowns, sterile gloves, sterile drape, hand hygiene and skin antiseptic. A timeout was performed prior to the initiation of the procedure. The prior gastrostomy tube ostomy was anesthetized by infiltration with 1% lidocaine. Using a hemostat, the overlying scab was debrided. The hemostat was then used to gently open the ostomy tract. A 5 [place] the catheter was navigated through the ostomy and into the stomach. Contrast was injected confirming that the catheter is indeed within the gastric fundus. A wire was placed. The existing ostomy tract was then dilated to 20 [place] into a 267French percutaneous gastrostomy tube was lubricated and advanced over the wire into the stomach. The retention balloon was filled with 15 mL saline and pulled snug against the anterior abdominal wall. The external bumper was fixed in place. Contrast was injected through the tube confirming its location within the stomach. The tube was then flushed with saline and capped. The patient tolerated the procedure well. IMPRESSION: Successful replacement of 20 French percutaneous  gastrostomy tube. Electronically Signed   By: HJacqulynn CadetM.D.   On: 03/10/2022 10:10   DG Chest Portable 1 View  Result Date: 03/02/2022 CLINICAL DATA:  Issues with port EXAM: PORTABLE CHEST 1 VIEW COMPARISON:  02/14/2022, CT 02/14/2022 FINDINGS: Left-sided central venous port tip over the SVC. Tubing appears grossly continuous. No pleural effusion or pneumothorax. Stable cardiomediastinal silhouette. Probable skin fold artifact over the left chest. Multiple old bilateral rib fractures. Mild nodularity in the right upper lobe, likely corresponds to tree-in-bud density on the prior CT. IMPRESSION: 1. Left-sided central venous port tip over the SVC. Port tubing appears grossly continuous. 2. Mild nodularity in the right upper lobe adjacent to the fissure, likely corresponds to tree-in-bud density on prior CT. Electronically Signed   By: KDonavan FoilM.D.   On: 03/02/2022 23:43   CT HEAD WO CONTRAST (5MM)  Result Date: 03/02/2022 CLINICAL DATA:  Head trauma. EXAM: CT HEAD WITHOUT  CONTRAST TECHNIQUE: Contiguous axial images were obtained from the base of the skull through the vertex without intravenous contrast. RADIATION DOSE REDUCTION: This exam was performed according to the departmental dose-optimization program which includes automated exposure control, adjustment of the mA and/or kV according to patient size and/or use of iterative reconstruction technique. COMPARISON:  CT Head 02/28/22 FINDINGS: Brain: No evidence of acute infarction, hemorrhage, hydrocephalus, extra-axial collection or mass lesion/mass effect. Sequela of mild chronic microvascular ischemic change. Vascular: No hyperdense vessel or unexpected calcification. Skull: Normal. Negative for fracture or focal lesion. Sinuses/Orbits: No acute finding. Other: None IMPRESSION: No acute intracranial abnormality. Electronically Signed   By: Marin Roberts M.D.   On: 03/02/2022 19:51   DG Knee 2 Views Left  Result Date: 03/01/2022 CLINICAL  DATA:  Trauma EXAM: LEFT KNEE - 1-2 VIEW COMPARISON:  None Available. FINDINGS: Trace knee joint effusion. No fracture. No dislocation. Mild enthesopathic changes of the patella. Sclerotic lesion in the fibula has well-circumscribed margins and is favored to be benign in the absence of other known osseous metastases. No discernible soft tissue abnormality. IMPRESSION: Trace knee joint effusion. No fracture or dislocation. Electronically Signed   By: Marin Roberts M.D.   On: 03/01/2022 08:21   DG Knee 2 Views Right  Result Date: 03/01/2022 CLINICAL DATA:  Fall with patellar pain. EXAM: RIGHT KNEE - 1-2 VIEW COMPARISON:  None Available. FINDINGS: No evidence of regional fracture. No weight-bearing compartment degenerative disease. 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 No other finding of note. IMPRESSION: No acute or traumatic finding. 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 Electronically Signed   By: Nelson Chimes M.D.   On: 03/01/2022 08:18   CT Head Wo Contrast  Result Date: 02/28/2022 CLINICAL DATA:  Head trauma, moderate to severe. [place] getting out of bed with laceration to left eyebrow. EXAM: CT HEAD WITHOUT CONTRAST TECHNIQUE: Contiguous axial images were obtained from the base of the skull through the vertex without intravenous contrast. RADIATION DOSE REDUCTION: This exam was performed according to the departmental dose-optimization program which includes automated exposure control, adjustment of the mA and/or kV according to patient size and/or use of iterative reconstruction technique. COMPARISON:  None. FINDINGS: Brain: No acute intracranial hemorrhage, midline shift or mass effect. No extra-axial fluid collection. Diffuse atrophy is noted. Periventricular white matter hypodensities are noted bilaterally. No hydrocephalus. Vascular: There is atherosclerotic calcification of the carotid siphons. No hyperdense vessel. Skull: Normal. Negative for fracture or focal lesion.  Sinuses/Orbits: No acute finding. Other: None. IMPRESSION: 1. No acute intracranial process. 2. Atrophy with chronic microvascular ischemic changes. Electronically Signed   By: Brett Fairy M.D.   On: 02/28/2022 03:40    Barton Dubois, MD  [facility]  Time: 35 minutes.  If 7PM-7AM, please contact night-coverage [email or web address] Password [facility] 03/20/2022   LOS: 18 days

## 2022-03-20 NOTE — Consult Note (Signed)
[facility] Consultation Oncology  Name: Trevor [name]      MRN: [id number]    Location: A317/A317-01  Date: 03/20/2022 Time:4:36 PM   REFERRING PHYSICIAN: Dr. Dyann Kief  REASON FOR CONSULT: Metastatic squamous cell carcinoma of the distal esophagus   DIAGNOSIS: Cachexia and failure to thrive  HISTORY OF PRESENT ILLNESS: Trevor [name] is a 51 year old male known to me from office visits.  He was diagnosed with stage IV lower esophageal squamous cell carcinoma in May 2023 after a 34 pound weight loss and dysphagia to solids 2 months preceding presentation.  Metastatic site was confirmed with left deltoid biopsy and was started on FOLFOX and nivolumab on 10/26/2021.  He completed 6 cycles on 01/04/2022 and chemotherapy was held due to inability to tolerate tube feeds and continuing weight loss.  CT CAP on 01/31/2022 showed decrease in size of the distal esophageal mass, resolution of abdominal nodal metastasis, and skeletal muscle lesions.  He was last seen in our clinic on 02/01/2022.  He continued to have problems with weight loss since then and had multiple hospitalizations.  Most recent hospitalization on 03/01/2022 due to dizziness and frequent falls, weight loss and hyponatremia.  He was started on continuous tube feeds on 03/03/2022, now receiving 60 mL/h of vital 1.2.  Despite being on tube feeds, he has lost 7 pounds since hospitalization.  PAST MEDICAL HISTORY:   Past Medical History:  Diagnosis Date   Diabetes mellitus without complication ([place])    Esophageal cancer ([place])    Hypertension     ALLERGIES: Allergies  Allergen Reactions   Pork-Derived Products Other (See Comments)    Patient does not eat pork due to his religious belief      MEDICATIONS: I have reviewed the patient's current medications.     PAST SURGICAL HISTORY Past Surgical History:  Procedure Laterality Date   BIOPSY  08/26/2021   Procedure: BIOPSY;  Surgeon: Eloise Harman, DO;  Location: [facility] ENDO SUITE;  Service:  Endoscopy;;   BRONCHIAL BIOPSY  01/23/2022   Procedure: BRONCHIAL BIOPSIES;  Surgeon: Collene Gobble, MD;  Location: [facility] ENDOSCOPY;  Service: Pulmonary;;   BRONCHIAL BRUSHINGS  01/23/2022   Procedure: BRONCHIAL BRUSHINGS;  Surgeon: Collene Gobble, MD;  Location: [facility] ENDOSCOPY;  Service: Pulmonary;;   BRONCHIAL NEEDLE ASPIRATION BIOPSY  01/23/2022   Procedure: BRONCHIAL NEEDLE ASPIRATION BIOPSIES;  Surgeon: Collene Gobble, MD;  Location: [facility] ENDOSCOPY;  Service: Pulmonary;;   BRONCHIAL WASHINGS  01/23/2022   Procedure: BRONCHIAL WASHINGS;  Surgeon: Collene Gobble, MD;  Location: [facility] ENDOSCOPY;  Service: Pulmonary;;   ESOPHAGOGASTRODUODENOSCOPY (EGD) WITH PROPOFOL N/A 08/26/2021   Procedure: ESOPHAGOGASTRODUODENOSCOPY (EGD) WITH PROPOFOL;  Surgeon: Eloise Harman, DO;  Location: [facility] ENDO SUITE;  Service: Endoscopy;  Laterality: N/A;  2:00pm   GASTROSTOMY N/A 10/12/2021   Procedure: OPEN GASTROSTOMY TUBE;  Surgeon: Aviva Signs, MD;  Location: [facility] ORS;  Service: General;  Laterality: N/A;   IR Iron GASTRO/COLONIC TUBE PERCUT W/FLUORO  03/10/2022   PORTACATH PLACEMENT Left 10/12/2021   Procedure: INSERTION PORT-A-CATH;  Surgeon: Aviva Signs, MD;  Location: [facility] ORS;  Service: General;  Laterality: Left;   VIDEO BRONCHOSCOPY WITH RADIAL ENDOBRONCHIAL ULTRASOUND  01/23/2022   Procedure: VIDEO BRONCHOSCOPY WITH RADIAL ENDOBRONCHIAL ULTRASOUND;  Surgeon: Collene Gobble, MD;  Location: [facility] ENDOSCOPY;  Service: Pulmonary;;    FAMILY HISTORY: Family History  Problem Relation Age of Onset   Cancer Father        prostate   Cancer Sister  Colon cancer Neg Hx    Esophageal cancer Neg Hx     SOCIAL HISTORY:  reports that he has been smoking cigarettes. He has been smoking an average of .5 packs per day. He has never used smokeless tobacco. He reports that he does not currently use alcohol after a past usage of about 14.0 standard drinks of alcohol per week. He reports that he does not use  drugs.  PERFORMANCE STATUS: The patient's performance status is 3 - Symptomatic, >50% confined to bed  PHYSICAL EXAM: Most Recent Vital Signs: Blood pressure 104/73, pulse (!) 101, temperature 99.2 F (37.3 C), temperature source Oral, resp. rate 20, height _0  (1.676 m), weight 86 lb 13.8 oz (39.4 kg), SpO2 100 %. BP 104/73 (BP Location: Right Arm)   Pulse (!) 101   Temp 99.2 F (37.3 C) (Oral)   Resp 20   Ht _1  (1.676 m)   Wt 86 lb 13.8 oz (39.4 kg)   SpO2 100%   BMI 14.02 kg/m  General appearance: alert, cooperative, appears older than stated age, and cachectic Extremities:  Extremities are very cachectic Neurologic: Grossly normal  LABORATORY DATA:  No results found for this or any previous visit (from the past 48 hour(s)).    RADIOGRAPHY: No results found.     ASSESSMENT and PLAN:  1.  Stage IV lower esophageal squamous cell carcinoma: - Presentation with dysphagia and 34 pound weight loss in 2 months prior to diagnosis. - EGD on 08/26/2021 with large fungating ulcerating mass in the lower third of the esophagus 30 cm from incisors nearly obstructing and circumferential.  Scope could not be advanced. - Pathology: Invasive moderately to poorly differentiated squamous cell carcinoma. - PET scan (09/22/2021): Positive to left lung nodule.  Left deltoid uptake SUV 6.9.  Left rectal spine musculature adjacent to L3 transverse process SUV 8.4.  Left gluteal activity SUV 2.97.  PET positive large esophageal mass involving half to one third of the distal esophagus with upper abdominal nodal metastasis. - Left deltoid biopsy (10/13/2021): Keratinizing squamous cell carcinoma. - [facility] NGS: PD-L1 CPS 15%, HER2 negative, MSI-stable - 6 cycles of FOLFOX plus nivolumab from 10/26/2021 through 01/04/2022 - CT CAP (01/31/2022): Decrease in size of the distal esophageal mass, resolution of upper abdominal nodal metastasis and skeletal muscle lesions. - Further systemic therapy was held since  then due to continued weight loss. - I had a prolonged discussion with him in the presence of his brother that it is not feasible to restart him on any cancer therapy due to his poor functional status and continuing weight loss.  He is adamant that he will work on increasing his weight to about 100 pounds once he is discharged from the hospital.  He cannot tolerate anything by mouth due to obstruction in the distal esophagus from the primary tumor. - He would like to get a second opinion.  I will refer him to Dr. Jimmy Footman at [facility]. - He will soon be discharged to a rehab facility.  We will reevaluate him in 6 weeks.  2.  Weight loss: - Last weight recorded in the hospital was 83 pounds on 03/17/2022.  His weight when he came to the hospital was 90 pounds. - He is continuing to lose weight despite being on continued tube feeds with at least 1800 cal/day.  All questions were answered. The patient knows to call the clinic with any problems, questions or concerns. We can certainly see the patient much  sooner if necessary.   Derek Jack

## 2022-03-20 NOTE — Progress Notes (Signed)
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 He has frequent falls at home worse in last couple of days. He is dizzy all the time. He is not doing water flushes in PEG and continues to smoke. His chemotherapy was placed on hold due to severe weight loss.    PT Comments    REASSESSMENT: Patient able to sit up at bedside without assistance and ambulate in hallway with RW/min guard, limited mostly due to fatigue, generalized weakness and decreased endurance. Patient then able to sit at bedside and complete several bilateral LE exercises with good carryover. Patient requested to be put back to bed after therapy. Patient will benefit from continued skilled physical therapy in hospital and recommended venue below to increase strength, balance, endurance for safe ADLs and gait.   Recommendations for follow up therapy are one component of a multi-disciplinary discharge planning process, led by the attending physician.  Recommendations may be updated based on patient status, additional functional criteria and insurance authorization.  Follow Up Recommendations  Skilled nursing-short term rehab (<3 hours/day) Can patient physically be transported by private vehicle: Yes   Assistance Recommended at Discharge Set up Supervision/Assistance  Patient can return home with the following A little help with walking and/or transfers;A little help with bathing/dressing/bathroom;Assistance with cooking/housework;Help with stairs or ramp for entrance   Equipment Recommendations  Rolling walker (2  wheels)    Recommendations for Other Services       Precautions / Restrictions Precautions Precautions: Fall Restrictions Weight Bearing Restrictions: No     Mobility  Bed Mobility Overal bed mobility: Modified Independent Bed Mobility: Supine to Sit, Sit to Supine     Supine to sit: HOB elevated, Modified independent (Device/Increase time) Sit to supine: Modified independent (Device/Increase time)        Transfers Overall transfer level: Needs assistance Equipment used: Rolling walker (2 wheels) Transfers: Sit to/from Stand Sit to Stand: Supervision           General transfer comment: patient able to stand with RW and supervision    Ambulation/Gait Ambulation/Gait assistance: Min guard Gait Distance (Feet): 35 Feet Assistive device: Rolling walker (2 wheels) Gait Pattern/deviations: Decreased step length - right, Decreased step length - left, Decreased stride length Gait velocity: decreased     General Gait Details: patient able to ambulate in hallway with RW/min guard with no loss of balance, limited mostly due to fatigue, generalized weakness and decreased endurance   Stairs             Wheelchair Mobility    Modified Rankin (Stroke Patients Only)       Balance Overall balance assessment: Needs assistance Sitting-balance support: Feet supported, No upper extremity supported Sitting balance-Leahy Scale: Good Sitting balance - Comments: seated EOB   Standing balance support: During functional activity, Bilateral upper extremity supported Standing balance-Leahy Scale: Fair Standing balance comment: with RW                            Cognition Arousal/Alertness: Awake/alert Behavior During Therapy: WFL for tasks assessed/performed Overall Cognitive Status: Within Functional Limits for tasks assessed  Exercises General Exercises - Lower Extremity Long Arc Quad: Seated,  AROM, Strengthening, Both, 10 reps Hip Flexion/Marching: Seated, AROM, Strengthening, Both, 10 reps Toe Raises: Seated, AROM, Strengthening, Both, 10 reps Heel Raises: Seated, AROM, Strengthening, Both, 10 reps    General Comments        Pertinent Vitals/Pain Pain Assessment Pain Assessment: No/denies pain    Home Living                          Prior Function            PT Goals (current goals can now be found in the care plan section) Acute Rehab PT Goals Patient Stated Goal: return home after rehab PT Goal Formulation: With patient Time For Goal Achievement: 04/03/22 Potential to Achieve Goals: Good Progress towards PT goals: Progressing toward goals    Frequency    Min 3X/week      PT Plan Current plan remains appropriate    Co-evaluation              AM-PAC PT "6 Clicks" Mobility   Outcome Measure  Help needed turning from your back to your side while in a flat bed without using bedrails?: None Help needed moving from lying on your back to sitting on the side of a flat bed without using bedrails?: None Help needed moving to and from a bed to a chair (including a wheelchair)?: A Little Help needed standing up from a chair using your arms (e.g., wheelchair or bedside chair)?: A Little Help needed to walk in hospital room?: A Little Help needed climbing 3-5 steps with a railing? : A Lot 6 Click Score: 19    End of Session   Activity Tolerance: Patient tolerated treatment well;Patient limited by fatigue Patient left: in bed;with call bell/phone within reach Nurse Communication: Mobility status PT Visit Diagnosis: Other abnormalities of gait and mobility (R26.89);Muscle weakness (generalized) (M62.81);History of falling (Z91.81);Unsteadiness on feet (R26.81)     Time: 5329-9242 PT Time Calculation (min) (ACUTE ONLY): 11 min  Charges:  $Gait Training: 8-22 mins                     Zigmund Gottron, SPT

## 2022-03-21 ENCOUNTER — Other Ambulatory Visit: Payer: Self-pay

## 2022-03-21 DIAGNOSIS — C159 Malignant neoplasm of esophagus, unspecified: Secondary | ICD-10-CM | POA: Diagnosis not present

## 2022-03-21 DIAGNOSIS — R627 Adult failure to thrive: Secondary | ICD-10-CM | POA: Diagnosis not present

## 2022-03-21 DIAGNOSIS — R531 Weakness: Secondary | ICD-10-CM | POA: Diagnosis not present

## 2022-03-21 DIAGNOSIS — E870 Hyperosmolality and hypernatremia: Secondary | ICD-10-CM | POA: Diagnosis not present

## 2022-03-21 NOTE — TOC Progression Note (Signed)
Transition of Care [facility]) - Progression Note    Patient Details  Name: Trevor [name] MRN: [id number] Date of Birth: 09-Sep-1970  Transition of Care [facility]) CM/SW [place], [place] Phone Number: 03/21/2022, 10:31 AM  Clinical Narrative:    CSW spoke to [name] with [facility] who states that pts insurance [name] is still pending at this time. CSW requested follow up this afternoon. TOC to follow.   Expected Discharge Plan: [place] Barriers to Discharge: Insurance Authorization  Expected Discharge Plan and Services Expected Discharge Plan: [place] In-house Referral: Clinical Social Work Discharge Planning Services: CM Consult Post Acute Care Choice: [place] Living arrangements for the past 2 months: Apartment Expected Discharge Date: 03/03/22                                     Social Determinants of Health (SDOH) Interventions Housing Interventions: Intervention Not Indicated  Readmission Risk Interventions    03/03/2022   11:46 AM 01/24/2022    3:13 PM  Readmission Risk Prevention Plan  Transportation Screening Complete Complete  PCP or Specialist Appt within 3-5 Days  Complete  HRI or [place]  Complete  Social Work Consult for [place] Planning/Counseling  Complete  Palliative Care Screening  Complete  Medication Review Press photographer) Complete Complete  HRI or [place] Complete   SW Recovery Care/Counseling Consult Complete   Palliative Care Screening Complete   [place] Patient Refused

## 2022-03-21 NOTE — Progress Notes (Signed)
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 He has frequent falls at home worse in last couple of days. He is dizzy all the time.  He is not doing water flushes in PEG and continues to smoke.  He refuses SNF.  His chemotherapy was placed on hold due to severe weight loss.  He does not accept that he is dying. He wants to remain full code.  He demands to be admitted to [facility], adamantly refusing to stay at [facility].  He was noted to have an AKI with severe hypernatremia consistent with worsening volume and nutritional status.  He was agreeable to IV fluids overnight but wants to be discharged home tomorrow.   He again refuses to be admitted to [facility].   As the hospitalization progress,  he ultimately agreed to stay at [facility] after he came to the realization that he would not receive any different care at [facility].  He was started on hypotonic fluid  and free water flushes with improvement in his hypernatremia.   His hospitalization was prolonged due to G-tube malfunction requiring replacement and then dislodgement of his Gtube.  This required IR to place new G-tube.  Subsequently, delay in insurance authorization resulted in further delay.   Currently remains hemodynamically stable, so far tolerating G-tube feedings and in no acute distress.  Insurance authorization is still pending.  Assessment/Plan:  Hypernatremia / Severe Dehydration  - He has severe free water deficiency - Receiving D5W infusion (35 ml/hr) and free water (100 ml q4h) -Sodium normalized after supplementing adequate free water and IVF -given 1/2NS  boluses also -now just getting free water via PEG -Overall stable.  Continue to maintain adequate hydration. -Very poor prognosis.   Hypotension -pt has had soft BPs for most of his hospitalization -check lactic 1.2 -check PCT 0.13 -rebolused LR and 1/2 NS -started midodrine -am cortisol 4.0 -11/15 1345--97.4--110--16--87/62--99% on 2L -11/16 0800--98.8--98--16--91/70--100% 2L -11/18--BP overall improved with the use of midodrine   G-tube Malfunction -G tube has a hole>>leaking -reconsult general surgery>>placed new Gtube 11/15 -early AM 03/09/22--Gtube "fell out" -discussed with Dr. 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 Delton Coombes - palliative care met with him during last admission and he remained in denial about the severity of illness and wanted to be full code/full scope care  -continue to see Dr. Delton Coombes for ongoing discussion after rehabilitation (planning for follow-up in 6 weeks)..  Oncology has also arranged for second opinion evaluation at [facility].   Frequent Falls - likely exacerbated by severe debility, malnutrition, weight loss - fall precautions recommended - PT ordered recommending SNF  -Waiting insurance authorization.   Failure to thrive  - Pt seems unable to care for self at home - PT>>SNF   Severe protein calorie malnutrition - resume regular diet plus PEG tube feeds  - Phosphorous nadir 1.4 - monitor for refeeding syndrome, recheck Mg,  Phos in AM, replete as needed    Refeeding Syndrome - replacing phosphorus and Mg as needed    Hypophosphatemia/hypokalemia - IV replacement and PEG replacement ordered  -continue Kphos once daily  -Continue daily potassium  supplementation.   AKI Resolved - prerenal from severe dehydration  -serum creatinine peaked 1.32 -resolved and is stable currently.   Tobacco  -Continue nicotine patch. - counseled patient on tobacco cessation    Chronic nausea and vomiting - secondary to esophageal malignancy -Continue supportive treatment and as needed antiemetics.   -pt insists upon po intake, will not follow medical advice.   History of cavitary pneumonia - completed a 4 week course of augmentin  - now stable on RA without leukocytosis -Patient is afebrile.   Peripheral neuropathy - secondary to prior chronic alcohol abuse  -Continue the use of gabapentin    Leukocytosis  - pt completed 4 week course of augmentin for cavitary pneumonia treatment -now due to stress demargination -afebrile and resolved with tx of acute medical issues     Family Communication: none available  Consultants: Oncology, general surgery, IR, palliative  Code Status:  FULL   DVT Prophylaxis:  Xarelto   Procedures: As Listed in Progress Note Above  Antibiotics: None   Subjective: Cachectic, chronically ill in appearance and demonstrating generalized weakness and deconditioning.  Good tolerance of tube feedings appreciated.  Patient is afebrile.  No overnight events.  Objective: Vitals:   03/20/22 2130 03/21/22 0432 03/21/22 0455 03/21/22 0500  BP: 98/71 92/66 (!) 157/72   Pulse: 100 100 71   Resp: _0 Temp: 98.9 F (37.2 C) 99.1 F (37.3 C) 98.4 F (36.9 C)   TempSrc:      SpO2: 100% 100% 100%   Weight:    38.9 kg  Height:       No intake or output data in the 24 hours ending 03/21/22 1622  Weight change: -0.5 kg  Exam: General exam: Alert, awake, oriented x 3; no overnight events.  Continues to be weak and deconditioned.  Patient is afebrile. Respiratory system: No using accessory muscle.  Good air movement bilaterally.  Good saturation on room air. Cardiovascular system:RRR. No rubs or  gallops. Gastrointestinal system: Abdomen is nondistended, soft and nontender. No organomegaly or masses felt. Normal bowel sounds heard. Central nervous system: Alert and oriented. No focal neurological deficits. Extremities: No cyanosis or clubbing. Skin: No petechiae. Psychiatry: Judgement and insight appear normal. Mood & affect appropriate.    Data Reviewed: I have personally reviewed following labs and imaging studies  CBG: No results for input(s): "GLUCAP" in the last 168 hours.  Urine analysis:    Component Value Date/Time   COLORURINE AMBER (A) 02/16/2022 0500   APPEARANCEUR CLOUDY (A) 02/16/2022 0500   LABSPEC >1.046 (H) 02/16/2022 0500   PHURINE 5.0 02/16/2022 0500   GLUCOSEU NEGATIVE 02/16/2022 0500   HGBUR NEGATIVE 02/16/2022 0500   BILIRUBINUR SMALL (A) 02/16/2022 0500   KETONESUR NEGATIVE 02/16/2022 0500   PROTEINUR 30 (A) 02/16/2022 0500   NITRITE NEGATIVE 02/16/2022 0500   LEUKOCYTESUR NEGATIVE 02/16/2022 0500   Scheduled Meds:  allopurinol  300 mg Per Tube Daily   Chlorhexidine Gluconate Cloth  6 each Topical Daily   dextrose  0.5 ampule Intravenous Once   folic acid  1 mg Per Tube Daily   free water  100 mL Per Tube Q4H   gabapentin  300 mg Per Tube QHS   megestrol  400 mg Per Tube BID  midodrine  5 mg Per Tube TID WC   pantoprazole  40 mg Oral Daily   phosphorus  500 mg Per Tube Daily   potassium chloride  40 mEq Per Tube BID   rivaroxaban  10 mg Per Tube Q supper   thiamine  100 mg Per Tube Daily   Continuous Infusions:  feeding supplement (VITAL AF 1.2 CAL) 1,000 mL (03/20/22 0729)    Procedures/Studies: IR Replc Gastro/Colonic Tube Percut W/Fluoro  Result Date: 03/10/2022 INDICATION: 51 year old male with a history of surgically placed gastrostomy tube which was inadvertently displaced. Tube could not be replaced at bedside at outside hospital so patient now presents to [facility] interventional radiology for tube replacement. EXAM:  GASTROSTOMY CATHETER REPLACEMENT MEDICATIONS: Ancef 2 g ANESTHESIA/SEDATION: Versed 1 mg IV; Fentanyl 25 mcg IV Moderate Sedation Time:  9 minutes The patient's vital signs and level of consciousness were continuously monitored during the procedure by the interventional radiology nurse under my direct supervision. CONTRAST:  3m OMNIPAQUE IOHEXOL 300 MG/ML SOLN - administered into the gastric lumen. FLUOROSCOPY TIME:  Radiation exposure index: 1 mGy reference air kerma COMPLICATIONS: None immediate. PROCEDURE: Informed written consent was obtained from the patient after a thorough discussion of the procedural risks, benefits and alternatives. All questions were addressed. Maximal Sterile Barrier Technique was utilized including caps, mask, sterile gowns, sterile gloves, sterile drape, hand hygiene and skin antiseptic. A timeout was performed prior to the initiation of the procedure. The prior gastrostomy tube ostomy was anesthetized by infiltration with 1% lidocaine. Using a hemostat, the overlying scab was debrided. The hemostat was then used to gently open the ostomy tract. A 5 [place] the catheter was navigated through the ostomy and into the stomach. Contrast was injected confirming that the catheter is indeed within the gastric fundus. A wire was placed. The existing ostomy tract was then dilated to 20 [place] into a 252French percutaneous gastrostomy tube was lubricated and advanced over the wire into the stomach. The retention balloon was filled with 15 mL saline and pulled snug against the anterior abdominal wall. The external bumper was fixed in place. Contrast was injected through the tube confirming its location within the stomach. The tube was then flushed with saline and capped. The patient tolerated the procedure well. IMPRESSION: Successful replacement of 20 French percutaneous gastrostomy tube. Electronically Signed   By: HJacqulynn CadetM.D.   On: 03/10/2022 10:10   DG Chest Portable 1  View  Result Date: 03/02/2022 CLINICAL DATA:  Issues with port EXAM: PORTABLE CHEST 1 VIEW COMPARISON:  02/14/2022, CT 02/14/2022 FINDINGS: Left-sided central venous port tip over the SVC. Tubing appears grossly continuous. No pleural effusion or pneumothorax. Stable cardiomediastinal silhouette. Probable skin fold artifact over the left chest. Multiple old bilateral rib fractures. Mild nodularity in the right upper lobe, likely corresponds to tree-in-bud density on the prior CT. IMPRESSION: 1. Left-sided central venous port tip over the SVC. Port tubing appears grossly continuous. 2. Mild nodularity in the right upper lobe adjacent to the fissure, likely corresponds to tree-in-bud density on prior CT. Electronically Signed   By: KDonavan FoilM.D.   On: 03/02/2022 23:43   CT HEAD WO CONTRAST (5MM)  Result Date: 03/02/2022 CLINICAL DATA:  Head trauma. EXAM: CT HEAD WITHOUT CONTRAST TECHNIQUE: Contiguous axial images were obtained from the base of the skull through the vertex without intravenous contrast. RADIATION DOSE REDUCTION: This exam was performed according to the departmental dose-optimization program which includes automated exposure control, adjustment  of the mA and/or kV according to patient size and/or use of iterative reconstruction technique. COMPARISON:  CT Head 02/28/22 FINDINGS: Brain: No evidence of acute infarction, hemorrhage, hydrocephalus, extra-axial collection or mass lesion/mass effect. Sequela of mild chronic microvascular ischemic change. Vascular: No hyperdense vessel or unexpected calcification. Skull: Normal. Negative for fracture or focal lesion. Sinuses/Orbits: No acute finding. Other: None IMPRESSION: No acute intracranial abnormality. Electronically Signed   By: Marin Roberts M.D.   On: 03/02/2022 19:51   DG Knee 2 Views Left  Result Date: 03/01/2022 CLINICAL DATA:  Trauma EXAM: LEFT KNEE - 1-2 VIEW COMPARISON:  None Available. FINDINGS: Trace knee joint effusion. No  fracture. No dislocation. Mild enthesopathic changes of the patella. Sclerotic lesion in the fibula has well-circumscribed margins and is favored to be benign in the absence of other known osseous metastases. No discernible soft tissue abnormality. IMPRESSION: Trace knee joint effusion. No fracture or dislocation. Electronically Signed   By: Marin Roberts M.D.   On: 03/01/2022 08:21   DG Knee 2 Views Right  Result Date: 03/01/2022 CLINICAL DATA:  Fall with patellar pain. EXAM: RIGHT KNEE - 1-2 VIEW COMPARISON:  None Available. FINDINGS: No evidence of regional fracture. No weight-bearing compartment degenerative disease. 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 No other finding of note. IMPRESSION: No acute or traumatic finding. 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 Electronically Signed   By: Nelson Chimes M.D.   On: 03/01/2022 08:18   CT Head Wo Contrast  Result Date: 02/28/2022 CLINICAL DATA:  Head trauma, moderate to severe. [place] getting out of bed with laceration to left eyebrow. EXAM: CT HEAD WITHOUT CONTRAST TECHNIQUE: Contiguous axial images were obtained from the base of the skull through the vertex without intravenous contrast. RADIATION DOSE REDUCTION: This exam was performed according to the departmental dose-optimization program which includes automated exposure control, adjustment of the mA and/or kV according to patient size and/or use of iterative reconstruction technique. COMPARISON:  None. FINDINGS: Brain: No acute intracranial hemorrhage, midline shift or mass effect. No extra-axial fluid collection. Diffuse atrophy is noted. Periventricular white matter hypodensities are noted bilaterally. No hydrocephalus. Vascular: There is atherosclerotic calcification of the carotid siphons. No hyperdense vessel. Skull: Normal. Negative for fracture or focal lesion. Sinuses/Orbits: No acute finding. Other: None. IMPRESSION: 1. No acute intracranial process. 2. Atrophy with  chronic microvascular ischemic changes. Electronically Signed   By: Brett Fairy M.D.   On: 02/28/2022 03:40    Barton Dubois, MD  [facility]  Time: 35 minutes.  If 7PM-7AM, please contact night-coverage [email or web address] Password [facility] 03/21/2022   LOS: 19 days

## 2022-03-22 ENCOUNTER — Other Ambulatory Visit: Payer: Self-pay

## 2022-03-22 DIAGNOSIS — E870 Hyperosmolality and hypernatremia: Secondary | ICD-10-CM | POA: Diagnosis not present

## 2022-03-22 MED ORDER — FREE WATER
200.0000 mL | Status: DC
  Administered 2022-03-22 – 2022-03-23 (×6): 200 mL

## 2022-03-22 MED ORDER — METOPROLOL TARTRATE 25 MG PO TABS
12.5000 mg | ORAL_TABLET | Freq: Two times a day (BID) | ORAL | Status: DC
  Administered 2022-03-22: 12.5 mg via ORAL
  Filled 2022-03-22 (×3): qty 1

## 2022-03-22 MED ORDER — SODIUM CHLORIDE 0.9 % IV SOLN: INTRAVENOUS | Status: AC

## 2022-03-22 MED ORDER — VITAL 1.5 CAL PO LIQD
1000.0000 mL | ORAL | Status: DC
  Administered 2022-03-22 – 2022-03-23 (×2): 1000 mL

## 2022-03-22 NOTE — Progress Notes (Addendum)
Patient ran out of tube feed (vitals 1.2), only available were 1.0 and 1.5, patient was temporarily continued with 1.5 at this time with goal of suspending this and returning back to 1.2 in the morning when this becomes available.  New consult will be placed for dietitian for possible adjustment of current rate pending availability of vitals 1.2

## 2022-03-22 NOTE — Progress Notes (Signed)
Patient tube feeding was running low, notified AC to obtain tube feeding who informed me that it was none available. Notified Dr. Josephine Cables to make aware of what we had on hand. Temporarily started on Vital 1.5 until 1.2 is available. Tube feeding was not running from 0345 until 0520, which was resumed at 62m/hr. Advancing 132mhr q 24hrs until correct feeding arrives, consult with dietician also in place. Will continue to monitor patients tolerance closely.

## 2022-03-22 NOTE — Progress Notes (Signed)
Nutrition Follow-up  DOCUMENTATION CODES:   Severe malnutrition in context of chronic illness  INTERVENTION:  Currently continuous Vital 1.5@ 20 ml/hr per PEG tube - advance 10 ml/hr q 4 hrs to goal rate of 55 ml/hr   Provides: 1980 kcal, 89 gr protein, 1008 ml water.  free water 100 ml q 4 hrs  NUTRITION DIAGNOSIS:   Severe Malnutrition related to chronic illness (esophageal cancer- Stage IV) as evidenced by severe muscle depletion, severe fat depletion.  -addressed with enteral feeding  GOAL:   Patient will meet greater than or equal to 90% of their needs  -met  MONITOR:   PO intake, Labs, Weight trends, TF tolerance  REASON FOR ASSESSMENT:   Consult Enteral/tube feeding initiation and management  ASSESSMENT:  [name], PEG tube, severe malnutrition, and dehydrated at admission.  Formula substitution required overnight. Reviewed chart and talked with nursing. Rate re-calculated.   Medications: folic acid, megace, protonix, phosphorus (500 mg)daily, potassium chloride BID, thiamine 100 mg daily.      Latest Ref Rng & Units 03/14/2022    3:12 AM 03/13/2022    6:58 AM 03/12/2022    5:29 AM  BMP  Glucose 70 - 99 mg/dL 74  62  92   BUN 6 - 20 mg/dL _0 Creatinine 0.61 - 1.24 mg/dL 0.59  0.63  0.62   Sodium 135 - 145 mmol/L 139  138  141   Potassium 3.5 - 5.1 mmol/L 2.9  3.2  3.4   Chloride 98 - 111 mmol/L 115  114  116   CO2 22 - 32 mmol/L _1 Calcium 8.9 - 10.3 mg/dL 8.4  8.6  8.6       Diet Order:   Diet Order             Diet regular Room service appropriate? Yes; Fluid consistency: Thin  Diet effective now                   EDUCATION NEEDS:   Education needs have been addressed  Skin:  Skin Assessment: Reviewed RN Assessment (PEG tube- 20 french LUQ)  Last BM:  11/25  Height:   Ht Readings from Last 1 Encounters:  03/01/22 _2  (1.676 m)    Weight:   Wt Readings  from Last 1 Encounters:  03/22/22 38.9 kg    Ideal Body Weight:   65 kg  BMI:  Body mass index is 13.84 kg/m.  Estimated Nutritional Needs:   Kcal:  1800-2000  Protein:  88-100 gr  Fluid:  >1400 ml daily   Colman Cater MS,RD,CSG,LDN Contact: Shea Evans

## 2022-03-22 NOTE — Progress Notes (Signed)
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 He has frequent falls at home worse in last couple of days. He is dizzy all the time.  He is not doing water flushes in PEG and continues to smoke.  He refuses SNF.  His chemotherapy was placed on hold due to severe weight loss.  He does not accept that he is dying. He wants to remain full code.  He demands to be admitted to [facility], adamantly refusing to stay at [facility].  He was noted to have an AKI with severe hypernatremia consistent with worsening volume and nutritional status.  He was agreeable to IV fluids overnight but wants to be discharged home tomorrow.   He again refuses to be admitted to [facility]   As the hospitalization progress,  he ultimately agreed to stay at [facility] after he came to the realization that he would not receive any different care at [facility].  He was started on hypotonic fluid  and free water flushes with improvement in his hypernatremia.   His hospitalization was prolonged due to G-tube malfunction requiring replacement and then dislodgement of his Gtube.  This required IR to place new G-tube.  Subsequently, delay in insurance authorization resulted in further delay.   Currently remains hemodynamically stable, so far tolerating G-tube feedings and in no acute distress.  Insurance authorization is still pending.  Assessment/Plan:  Hypernatremia / Severe Dehydration  - He has severe free water deficiency Patient with soft blood pressure and tachycardia increase water flushes via PEG to  200 mg every 4 hours -Overall stable.  Continue to maintain  adequate hydration. -Very poor prognosis. --Patient with soft blood pressure and tachycardia increase water flushes via PEG to  200 ml every 4 hours -Give Ns IV x 1 L over 13 hours   Hypotension -pt has had soft BPs for most of his hospitalization -check lactic 1.2 -check PCT 0.13 -rebolused LR and 1/2 NS -started midodrine -am cortisol 4.0 -11/15 1345--97.4--110--16--87/62--99% on 2L -11/16 0800--98.8--98--16--91/70--100% 2L -Continue midodrine IV fluids and water flushes via PEG tube as above #1 -  G-tube Malfunction -G tube has a hole>>leaking -reconsult general surgery>>placed new Gtube 03/08/22 -early AM 03/09/22--Gtube "fell out" -discussed with Dr. Morene Rankins meatus closed>>consult IR to place new PEG via old tract -03/10/22--new G-tube placed by IR>>restart enteral feeding -goal rate of Vital 1.2 = 60 cc/hr   Stage IV B Metastatic Squamous Cell Cancer - his chemotherapy was placed on temporary hold due to severe weight loss - he is followed by Dr. Delton Coombes - palliative care met with him during last admission and he remained in denial about the severity of illness and wanted to be full code/full scope care  -continue to see Dr. Delton Coombes for ongoing discussion after rehabilitation (planning for follow-up in 6 weeks)..  Oncology has also arranged for second opinion evaluation at [facility].   Frequent Falls - likely exacerbated by severe debility, malnutrition, weight loss - fall precautions recommended - PT ordered recommending SNF  -Waiting insurance authorization.   Failure to thrive  - Pt seems unable to care for self at home - PT>>SNF   Severe protein calorie malnutrition -  resume regular diet plus PEG tube feeds  - Phosphorous nadir 1.4 - monitor for refeeding syndrome, recheck Mg, Phos in AM, replete as needed    Refeeding Syndrome - replacing phosphorus and Mg as needed    Hypophosphatemia/hypokalemia - IV replacement and PEG replacement  ordered  -continue Kphos once daily  -Continue daily potassium supplementation.   AKI Resolved - prerenal from severe dehydration  -serum creatinine peaked 1.32 -resolved and is stable currently.   Tobacco  -Continue nicotine patch. - counseled patient on tobacco cessation    Chronic nausea and vomiting - secondary to esophageal malignancy -Continue supportive treatment and as needed antiemetics.   -pt insists upon po intake, will not follow medical advice.   History of cavitary pneumonia - completed a 4 week course of augmentin  - now stable on RA without leukocytosis -Patient is afebrile.   Peripheral neuropathy - secondary to prior chronic alcohol abuse  -Continue the use of gabapentin      Family Communication: none available  Consultants: Oncology, general surgery, IR, palliative  Code Status:  FULL   DVT Prophylaxis:  Xarelto   Procedures: As Listed in Progress Note Above  Antibiotics: None   Subjective: Cachectic, chronically ill in appearance and demonstrating generalized weakness and deconditioning.  Good tolerance of tube feedings appreciated.  Patient is afebrile.  - Overnight tube feeding was interrupted due to lack of feeding formula for a while -Today patient has soft blood pressure and tachycardia-  -Patient with soft blood pressure and tachycardia increase water flushes via PEG to  200 ml every 4 hours -Give Ns IV x 1 L over 13 hours  Objective: Vitals:   03/22/22 0445 03/22/22 0500 03/22/22 1233 03/22/22 1234  BP: 105/87  (!) 110/96   Pulse: 99  (!) 122 (!) 102  Resp: 12  20   Temp: 98.4 F (36.9 C)     TempSrc: Oral     SpO2: 100%  100%   Weight:  38.9 kg    Height:        Intake/Output Summary (Last 24 hours) at 03/22/2022 1552 Last data filed at 03/22/2022 4081 Gross per 24 hour  Intake --  Output 200 ml  Net -200 ml    Weight change: 0 kg  Exam: General exam: Alert, awake, oriented x 3; frail and cachectic appearing   respiratory system: No using accessory muscle.  Good air movement bilaterally.  Good saturation on room air. Cardiovascular system:RRR. No rubs or gallops. Gastrointestinal system: Abdomen is nondistended, soft and nontender.  G-tube in situ Central nervous system: Alert and oriented. No focal neurological deficits. Extremities: No cyanosis or clubbing. Skin: No petechiae. Psychiatry: Judgement and insight appear normal. Mood & affect appropriate.    Data Reviewed: I have personally reviewed following labs and imaging studies  Urine analysis:    Component Value Date/Time   COLORURINE AMBER (A) 02/16/2022 0500   APPEARANCEUR CLOUDY (A) 02/16/2022 0500   LABSPEC >1.046 (H) 02/16/2022 0500   PHURINE 5.0 02/16/2022 0500   GLUCOSEU NEGATIVE 02/16/2022 0500   HGBUR NEGATIVE 02/16/2022 0500   BILIRUBINUR SMALL (A) 02/16/2022 0500   KETONESUR NEGATIVE 02/16/2022 0500   PROTEINUR 30 (A) 02/16/2022 0500   NITRITE NEGATIVE 02/16/2022 0500   LEUKOCYTESUR NEGATIVE 02/16/2022 0500   Scheduled Meds:  allopurinol  300 mg Per Tube Daily   Chlorhexidine Gluconate Cloth  6 each Topical Daily   dextrose  0.5 ampule Intravenous Once   folic acid  1 mg Per Tube Daily  free water  100 mL Per Tube Q4H   gabapentin  300 mg Per Tube QHS   megestrol  400 mg Per Tube BID   metoprolol tartrate  12.5 mg Oral BID   midodrine  5 mg Per Tube TID WC   pantoprazole  40 mg Oral Daily   phosphorus  500 mg Per Tube Daily   potassium chloride  40 mEq Per Tube BID   rivaroxaban  10 mg Per Tube Q supper   thiamine  100 mg Per Tube Daily   Continuous Infusions:  feeding supplement (VITAL 1.5 CAL) 1,000 mL (03/22/22 0905)   feeding supplement (VITAL AF 1.2 CAL) Stopped (03/22/22 0345)    Procedures/Studies: IR Replc Gastro/Colonic Tube Percut W/Fluoro  Result Date: 03/10/2022 INDICATION: 51 year old male with a history of surgically placed gastrostomy tube which was inadvertently displaced. Tube could not  be replaced at bedside at outside hospital so patient now presents to [facility]al radiology for tube replacement. EXAM: GASTROSTOMY CATHETER REPLACEMENT MEDICATIONS: Ancef 2 g ANESTHESIA/SEDATION: Versed 1 mg IV; Fentanyl 25 mcg IV Moderate Sedation Time:  9 minutes The patient's vital signs and level of consciousness were continuously monitored during the procedure by the interventional radiology nurse under my direct supervision. CONTRAST:  41m OMNIPAQUE IOHEXOL 300 MG/ML SOLN - administered into the gastric lumen. FLUOROSCOPY TIME:  Radiation exposure index: 1 mGy reference air kerma COMPLICATIONS: None immediate. PROCEDURE: Informed written consent was obtained from the patient after a thorough discussion of the procedural risks, benefits and alternatives. All questions were addressed. Maximal Sterile Barrier Technique was utilized including caps, mask, sterile gowns, sterile gloves, sterile drape, hand hygiene and skin antiseptic. A timeout was performed prior to the initiation of the procedure. The prior gastrostomy tube ostomy was anesthetized by infiltration with 1% lidocaine. Using a hemostat, the overlying scab was debrided. The hemostat was then used to gently open the ostomy tract. A 5 [place] the catheter was navigated through the ostomy and into the stomach. Contrast was injected confirming that the catheter is indeed within the gastric fundus. A wire was placed. The existing ostomy tract was then dilated to 20 [place] into a 228French percutaneous gastrostomy tube was lubricated and advanced over the wire into the stomach. The retention balloon was filled with 15 mL saline and pulled snug against the anterior abdominal wall. The external bumper was fixed in place. Contrast was injected through the tube confirming its location within the stomach. The tube was then flushed with saline and capped. The patient tolerated the procedure well. IMPRESSION: Successful replacement of 20  French percutaneous gastrostomy tube. Electronically Signed   By: HJacqulynn CadetM.D.   On: 03/10/2022 10:10   DG Chest Portable 1 View  Result Date: 03/02/2022 CLINICAL DATA:  Issues with port EXAM: PORTABLE CHEST 1 VIEW COMPARISON:  02/14/2022, CT 02/14/2022 FINDINGS: Left-sided central venous port tip over the SVC. Tubing appears grossly continuous. No pleural effusion or pneumothorax. Stable cardiomediastinal silhouette. Probable skin fold artifact over the left chest. Multiple old bilateral rib fractures. Mild nodularity in the right upper lobe, likely corresponds to tree-in-bud density on the prior CT. IMPRESSION: 1. Left-sided central venous port tip over the SVC. Port tubing appears grossly continuous. 2. Mild nodularity in the right upper lobe adjacent to the fissure, likely corresponds to tree-in-bud density on prior CT. Electronically Signed   By: KDonavan FoilM.D.   On: 03/02/2022 23:43   CT HEAD WO CONTRAST (5MM)  Result Date: 03/02/2022  CLINICAL DATA:  Head trauma. EXAM: CT HEAD WITHOUT CONTRAST TECHNIQUE: Contiguous axial images were obtained from the base of the skull through the vertex without intravenous contrast. RADIATION DOSE REDUCTION: This exam was performed according to the departmental dose-optimization program which includes automated exposure control, adjustment of the mA and/or kV according to patient size and/or use of iterative reconstruction technique. COMPARISON:  CT Head 02/28/22 FINDINGS: Brain: No evidence of acute infarction, hemorrhage, hydrocephalus, extra-axial collection or mass lesion/mass effect. Sequela of mild chronic microvascular ischemic change. Vascular: No hyperdense vessel or unexpected calcification. Skull: Normal. Negative for fracture or focal lesion. Sinuses/Orbits: No acute finding. Other: None IMPRESSION: No acute intracranial abnormality. Electronically Signed   By: Marin Roberts M.D.   On: 03/02/2022 19:51   DG Knee 2 Views Left  Result Date:  03/01/2022 CLINICAL DATA:  Trauma EXAM: LEFT KNEE - 1-2 VIEW COMPARISON:  None Available. FINDINGS: Trace knee joint effusion. No fracture. No dislocation. Mild enthesopathic changes of the patella. Sclerotic lesion in the fibula has well-circumscribed margins and is favored to be benign in the absence of other known osseous metastases. No discernible soft tissue abnormality. IMPRESSION: Trace knee joint effusion. No fracture or dislocation. Electronically Signed   By: Marin Roberts M.D.   On: 03/01/2022 08:21   DG Knee 2 Views Right  Result Date: 03/01/2022 CLINICAL DATA:  Fall with patellar pain. EXAM: RIGHT KNEE - 1-2 VIEW COMPARISON:  None Available. FINDINGS: No evidence of regional fracture. No weight-bearing compartment degenerative disease. 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 No other finding of note. IMPRESSION: No acute or traumatic finding. 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 Electronically Signed   By: Nelson Chimes M.D.   On: 03/01/2022 08:18   CT Head Wo Contrast  Result Date: 02/28/2022 CLINICAL DATA:  Head trauma, moderate to severe. [place] getting out of bed with laceration to left eyebrow. EXAM: CT HEAD WITHOUT CONTRAST TECHNIQUE: Contiguous axial images were obtained from the base of the skull through the vertex without intravenous contrast. RADIATION DOSE REDUCTION: This exam was performed according to the departmental dose-optimization program which includes automated exposure control, adjustment of the mA and/or kV according to patient size and/or use of iterative reconstruction technique. COMPARISON:  None. FINDINGS: Brain: No acute intracranial hemorrhage, midline shift or mass effect. No extra-axial fluid collection. Diffuse atrophy is noted. Periventricular white matter hypodensities are noted bilaterally. No hydrocephalus. Vascular: There is atherosclerotic calcification of the carotid siphons. No hyperdense vessel. Skull: Normal. Negative for  fracture or focal lesion. Sinuses/Orbits: No acute finding. Other: None. IMPRESSION: 1. No acute intracranial process. 2. Atrophy with chronic microvascular ischemic changes. Electronically Signed   By: Brett Fairy M.D.   On: 02/28/2022 03:40    Zellie Jenning Denton Brick, MD  [facility]   If 7PM-7AM, please contact night-coverage [email or web address] Password [facility] 03/22/2022   LOS: 20 days

## 2022-03-22 NOTE — Progress Notes (Signed)
PT Cancellation Note  Patient Details Name: Tiran Sauseda MRN: [id number] DOB: 06/06/1970   Cancelled Treatment:    Reason Eval/Treat Not Completed: Patient declined, no reason specified Attempted PT session, pt stated he was sleeping and does not wish to participate.  Ihor Austin, LPTA/CLT; CBIS [phone]  Aldona Lento 03/22/2022, 11:23 AM

## 2022-03-22 NOTE — Progress Notes (Signed)
Dinemap read 122, checked radial pulse it is 102 at this time, but seems to be flucuating 120-122   03/22/22 1233  Vitals  BP (!) 110/96  MAP (mmHg) 102  BP Method Automatic  Pulse Rate (!) 122  Pulse Rate Source Monitor  Resp 20  MEWS COLOR  MEWS Score Color Yellow  Oxygen Therapy  SpO2 100 %  MEWS Score  MEWS Temp 0  MEWS Systolic 0  MEWS Pulse 2  MEWS RR 0  MEWS LOC 0  MEWS Score 2

## 2022-03-23 ENCOUNTER — Ambulatory Visit: Payer: Self-pay | Admitting: *Deleted

## 2022-03-23 ENCOUNTER — Encounter: Payer: Self-pay | Admitting: *Deleted

## 2022-03-23 DIAGNOSIS — C159 Malignant neoplasm of esophagus, unspecified: Secondary | ICD-10-CM | POA: Diagnosis not present

## 2022-03-23 DIAGNOSIS — Z515 Encounter for palliative care: Secondary | ICD-10-CM | POA: Diagnosis not present

## 2022-03-23 DIAGNOSIS — Z8501 Personal history of malignant neoplasm of esophagus: Secondary | ICD-10-CM | POA: Diagnosis not present

## 2022-03-23 DIAGNOSIS — I959 Hypotension, unspecified: Secondary | ICD-10-CM | POA: Diagnosis not present

## 2022-03-23 DIAGNOSIS — Z79899 Other long term (current) drug therapy: Secondary | ICD-10-CM | POA: Diagnosis not present

## 2022-03-23 DIAGNOSIS — Z743 Need for continuous supervision: Secondary | ICD-10-CM | POA: Diagnosis not present

## 2022-03-23 DIAGNOSIS — N179 Acute kidney failure, unspecified: Secondary | ICD-10-CM | POA: Diagnosis not present

## 2022-03-23 DIAGNOSIS — R1310 Dysphagia, unspecified: Secondary | ICD-10-CM | POA: Diagnosis not present

## 2022-03-23 DIAGNOSIS — R279 Unspecified lack of coordination: Secondary | ICD-10-CM | POA: Diagnosis not present

## 2022-03-23 DIAGNOSIS — R5381 Other malaise: Secondary | ICD-10-CM | POA: Diagnosis not present

## 2022-03-23 DIAGNOSIS — E878 Other disorders of electrolyte and fluid balance, not elsewhere classified: Secondary | ICD-10-CM | POA: Diagnosis not present

## 2022-03-23 DIAGNOSIS — E43 Unspecified severe protein-calorie malnutrition: Secondary | ICD-10-CM | POA: Diagnosis not present

## 2022-03-23 DIAGNOSIS — E119 Type 2 diabetes mellitus without complications: Secondary | ICD-10-CM | POA: Diagnosis not present

## 2022-03-23 DIAGNOSIS — D649 Anemia, unspecified: Secondary | ICD-10-CM | POA: Diagnosis not present

## 2022-03-23 DIAGNOSIS — Z4682 Encounter for fitting and adjustment of non-vascular catheter: Secondary | ICD-10-CM | POA: Diagnosis not present

## 2022-03-23 DIAGNOSIS — R296 Repeated falls: Secondary | ICD-10-CM | POA: Diagnosis not present

## 2022-03-23 DIAGNOSIS — U071 COVID-19: Secondary | ICD-10-CM | POA: Diagnosis not present

## 2022-03-23 DIAGNOSIS — C772 Secondary and unspecified malignant neoplasm of intra-abdominal lymph nodes: Secondary | ICD-10-CM | POA: Diagnosis not present

## 2022-03-23 DIAGNOSIS — K9423 Gastrostomy malfunction: Secondary | ICD-10-CM | POA: Diagnosis not present

## 2022-03-23 DIAGNOSIS — R2689 Other abnormalities of gait and mobility: Secondary | ICD-10-CM | POA: Diagnosis not present

## 2022-03-23 DIAGNOSIS — Z7401 Bed confinement status: Secondary | ICD-10-CM | POA: Diagnosis not present

## 2022-03-23 DIAGNOSIS — E860 Dehydration: Secondary | ICD-10-CM | POA: Diagnosis not present

## 2022-03-23 DIAGNOSIS — E870 Hyperosmolality and hypernatremia: Secondary | ICD-10-CM | POA: Diagnosis not present

## 2022-03-23 DIAGNOSIS — Z431 Encounter for attention to gastrostomy: Secondary | ICD-10-CM | POA: Diagnosis not present

## 2022-03-23 DIAGNOSIS — L853 Xerosis cutis: Secondary | ICD-10-CM | POA: Diagnosis not present

## 2022-03-23 DIAGNOSIS — M6281 Muscle weakness (generalized): Secondary | ICD-10-CM | POA: Diagnosis not present

## 2022-03-23 DIAGNOSIS — I10 Essential (primary) hypertension: Secondary | ICD-10-CM | POA: Diagnosis not present

## 2022-03-23 DIAGNOSIS — C155 Malignant neoplasm of lower third of esophagus: Secondary | ICD-10-CM | POA: Diagnosis not present

## 2022-03-23 LAB — RENAL FUNCTION PANEL
Albumin: 2.8 g/dL — ABNORMAL LOW (ref 3.5–5.0)
Anion gap: 5 (ref 5–15)
BUN: 47 mg/dL — ABNORMAL HIGH (ref 6–20)
CO2: 15 mmol/L — ABNORMAL LOW (ref 22–32)
Calcium: 8.9 mg/dL (ref 8.9–10.3)
Chloride: 114 mmol/L — ABNORMAL HIGH (ref 98–111)
Creatinine, Ser: 0.81 mg/dL (ref 0.61–1.24)
GFR, Estimated: >60 mL/min (ref >60)
Glucose, Bld: 157 mg/dL — ABNORMAL HIGH (ref 70–99)
Phosphorus: 3.1 mg/dL (ref 2.5–4.6)
Potassium: 5.1 mmol/L (ref 3.5–5.1)
Sodium: 134 mmol/L — ABNORMAL LOW (ref 135–145)

## 2022-03-23 LAB — CBC
HCT: 30.3 % — ABNORMAL LOW (ref 39.0–52.0)
Hemoglobin: 9.2 g/dL — ABNORMAL LOW (ref 13.0–17.0)
MCH: 26.9 pg (ref 26.0–34.0)
MCHC: 30.4 g/dL (ref 30.0–36.0)
MCV: 88.6 fL (ref 80.0–100.0)
Platelets: 408 10*3/uL — ABNORMAL HIGH (ref 150–400)
RBC: 3.42 MIL/uL — ABNORMAL LOW (ref 4.22–5.81)
RDW: 20.7 % — ABNORMAL HIGH (ref 11.5–15.5)
WBC: 8.4 10*3/uL (ref 4.0–10.5)
nRBC: 0 % (ref 0.0–0.2)

## 2022-03-23 LAB — MAGNESIUM: Magnesium: 2.1 mg/dL (ref 1.7–2.4)

## 2022-03-23 MED ORDER — FERROUS SULFATE 325 (65 FE) MG PO TBEC: 325.0000 mg | DELAYED_RELEASE_TABLET | Freq: Every day | ORAL | 3 refills | Status: AC

## 2022-03-23 MED ORDER — POTASSIUM CHLORIDE CRYS ER 20 MEQ PO TBCR: 20.0000 meq | EXTENDED_RELEASE_TABLET | Freq: Every day | ORAL | 3 refills | Status: AC

## 2022-03-23 MED ORDER — HEPARIN SOD (PORK) LOCK FLUSH 100 UNIT/ML IV SOLN
500.0000 [IU] | Freq: Once | INTRAVENOUS | Status: AC
  Administered 2022-03-23: 500 [IU]
  Filled 2022-03-23: qty 5

## 2022-03-23 MED ORDER — PANTOPRAZOLE SODIUM 40 MG PO TBEC: 40.0000 mg | DELAYED_RELEASE_TABLET | Freq: Every day | ORAL | 3 refills | Status: DC

## 2022-03-23 MED ORDER — MULTI-VITAMIN/MINERALS PO TABS: 1.0000 | ORAL_TABLET | Freq: Every day | ORAL | 2 refills | Status: AC

## 2022-03-23 MED ORDER — VITAL 1.5 CAL PO LIQD: 1000.0000 mL | ORAL | 5 refills | Status: DC

## 2022-03-23 MED ORDER — HEPARIN SOD (PORK) LOCK FLUSH 10 UNIT/ML IV SOLN: 10.0000 [IU] | Freq: Once | INTRAVENOUS | Status: DC

## 2022-03-23 MED ORDER — FREE WATER: 200.0000 mL | Freq: Four times a day (QID) | 3 refills | Status: AC

## 2022-03-23 MED ORDER — METOPROLOL TARTRATE 25 MG PO TABS: 12.5000 mg | ORAL_TABLET | Freq: Two times a day (BID) | ORAL | 2 refills | Status: DC

## 2022-03-23 MED ORDER — OXYCODONE HCL 5 MG PO TABS: 5.0000 mg | ORAL_TABLET | Freq: Three times a day (TID) | ORAL | 0 refills | Status: DC | PRN

## 2022-03-23 NOTE — TOC Transition Note (Signed)
Transition of Care [facility]) - CM/SW Discharge Note   Patient Details  Name: Trevor [name] MRN: [id number] Date of Birth: 1971-04-01  [facility] CM/SW Contact:  Boneta Lucks, RN Phone Number: 03/23/2022, 3:07 PM   Clinical Narrative:   INS AUTH received, Patient is ready to go back to [facility]. Dc summary sent in the hub, RN called report. TOC call [facility] for transport. Waiver signed to in medical records basket to be scanned in the chart.    Final next level of care: Skilled Nursing Facility Barriers to Discharge: Barriers Resolved   Patient Goals and CMS Choice Patient states their goals for this hospitalization and ongoing recovery are:: return to SNF CMS Medicare.gov Compare Post Acute Care list provided to:: Patient Choice offered to / list presented to : Patient  Discharge Placement               Patient to be transferred to facility by: [facility]   Patient and family notified of of transfer: 03/23/22  Discharge Plan and Services In-house Referral: Clinical Social Work Discharge Planning Services: CM Consult Post Acute Care Choice: [place]               Social Determinants of Health (SDOH) Interventions Housing Interventions: Intervention Not Indicated   Readmission Risk Interventions    03/03/2022   11:46 AM 01/24/2022    3:13 PM  Readmission Risk Prevention Plan  Transportation Screening Complete Complete  PCP or Specialist Appt within 3-5 Days  Complete  HRI or [place]  Complete  Social Work Consult for [place] Planning/Counseling  Complete  Palliative Care Screening  Complete  Medication Review Press photographer) Complete Complete  HRI or [place] Complete   SW Recovery Care/Counseling Consult Complete   Palliative Care Screening Complete   [place] Patient Refused

## 2022-03-23 NOTE — Discharge Summary (Addendum)
Trevor [name], is a 51 y.o. male  DOB [birth year]  MRN [id number].  Admission date:  03/01/2022  Admitting Physician  Murlean Iba, MD  Discharge Date:  03/23/2022   Primary MD  [name], [name]  Recommendations for primary care physician for things to follow:  1) continuous tube feeding with vital 1.5@ 55 ml/hr per PEG tube -  2) repeat CBC and BMP blood test in about 3 to 4 days from now 3)Avoid ibuprofen/Advil/Aleve/Motrin/Goody Powders/Naproxen/BC powders/Meloxicam/Diclofenac/Indomethacin and other Nonsteroidal anti-inflammatory medications as these will make you more likely to bleed and can cause stomach ulcers, can also cause Kidney problems.  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)   Hypokalemia   HTN (hypertension)   Tachycardia, unspecified   Gout   GERD (gastroesophageal reflux disease)   Alcohol abuse   Tobacco abuse   Dysphagia   Protein-calorie malnutrition, severe   AKI (acute kidney injury) ([place])   PEG (percutaneous endoscopic gastrostomy) status (HCC)   FTT (failure to thrive) in adult   Falls frequently   Gait instability   PEG tube malfunction ([place])      Past Medical History:  Diagnosis Date   Diabetes mellitus without complication ([place])    Esophageal cancer ([place])    Hypertension     Past Surgical History:  Procedure Laterality Date   BIOPSY  08/26/2021   Procedure: BIOPSY;  Surgeon: Eloise Harman, DO;  [facility];  Service: Endoscopy;;   BRONCHIAL BIOPSY  01/23/2022   Procedure: BRONCHIAL BIOPSIES;  Surgeon: Collene Gobble, MD;  [facility];  Service: Pulmonary;;   BRONCHIAL BRUSHINGS  01/23/2022    Procedure: BRONCHIAL BRUSHINGS;  Surgeon: Collene Gobble, MD;  Location: [facility];  Service: Pulmonary;;   BRONCHIAL NEEDLE ASPIRATION BIOPSY  01/23/2022   Procedure: BRONCHIAL NEEDLE ASPIRATION BIOPSIES;  Surgeon: Collene Gobble, MD;  Location: [facility];  Service: Pulmonary;;   BRONCHIAL WASHINGS  01/23/2022   Procedure: BRONCHIAL WASHINGS;  Surgeon: Collene Gobble, MD;  Location: [facility];  Service: Pulmonary;;   ESOPHAGOGASTRODUODENOSCOPY (EGD) WITH PROPOFOL N/A 08/26/2021   Procedure: ESOPHAGOGASTRODUODENOSCOPY (EGD) WITH PROPOFOL;  Surgeon: Eloise Harman, DO;  [facility];  Service: Endoscopy;  Laterality: N/A;  2:00pm   GASTROSTOMY N/A 10/12/2021   Procedure: OPEN GASTROSTOMY TUBE;  Surgeon: Aviva Signs, MD;  Location: [facility];  Service: General;  Laterality: N/A;   IR [place] GASTRO/COLONIC TUBE PERCUT W/FLUORO  03/10/2022   PORTACATH PLACEMENT Left 10/12/2021   Procedure: INSERTION PORT-A-CATH;  Surgeon: Aviva Signs, MD;  Location: [facility];  Service: General;  Laterality: Left;   VIDEO BRONCHOSCOPY WITH RADIAL ENDOBRONCHIAL ULTRASOUND  01/23/2022   Procedure: VIDEO BRONCHOSCOPY WITH RADIAL ENDOBRONCHIAL ULTRASOUND;  Surgeon: Collene Gobble, MD;  Location: [facility];  Service: Pulmonary;;  HPI  from the history and physical done on the day of admission:  Chief Complaint: dizziness, frequent falls   HPI: Trevor [name] is a 51 year old male longtime smoker recently diagnosed with stage IV squamous cell esophageal cancer with metastasis earlier this year, severe protein calorie malnutrition, PEG tube dependency, anemia, frequent falls, failure to thrive, ongoing nausea and vomiting, alcohol abuse, multiple recent hospitalizations. He has frequent falls at home worse in last couple of days. He is dizzy all the time.  He is not doing water flushes in PEG and continues to smoke.  He refuses SNF.  His chemotherapy was placed on hold due to severe weight loss.  He  does not accept that he is dying. He wants to remain full code.  He demands to be admitted to [facility], adamantly refusing to stay at [facility].  He was noted to have an AKI with severe hypernatremia consistent with worsening volume and nutritional status.  He was agreeable to IV fluids overnight but wants to be discharged home tomorrow.   He again refuses to be admitted to [facility].      Hospital Course:     51 year old male longtime smoker recently diagnosed with stage IV squamous cell esophageal cancer with metastasis earlier this year, severe protein calorie malnutrition, PEG tube dependency, anemia, frequent falls, failure to thrive, ongoing nausea and vomiting, alcohol abuse, multiple recent hospitalizations. He has frequent falls at home worse in last couple of days. He is dizzy all the time.  He is not doing water flushes in PEG and continues to smoke.  He refuses SNF.  His chemotherapy was placed on hold due to severe weight loss.  He does not accept that he is dying. He wants to remain full code.  He demands to be admitted to [facility], adamantly refusing to stay at [facility].  He was noted to have an AKI with severe hypernatremia consistent with worsening volume and nutritional status.  He was agreeable to IV fluids overnight but wants to be discharged home tomorrow.   He again refuses to be admitted to [facility].   As the hospitalization progress,  he ultimately agreed to stay at [facility] after he came to the realization that he would not receive any different care at [facility].  He was started on hypotonic fluid  and free water flushes with improvement in his hypernatremia.   His hospitalization was prolonged due to G-tube malfunction requiring replacement and then dislodgement of his Gtube.  This required IR to place new G-tube.  Subsequently, delay in insurance authorization resulted in further delay.  Assessment and Plan:  Hypernatremia / Severe Dehydration/hypokalemia and hypophosphatemia - Had free water  deficits - increased water flushes via PEG to  200 mg every 4 hours -Overall improved with IV fluids and water flushes via PEG tube -Electrolytes were replaced   Hypotension --Suspect dehydration/volume depletion related -Improved with fluids -Continue midodrine -am cortisol 4.0 IV fluids and water flushes via PEG tube as above #1 -  G-tube Malfunction -G tube had a hole>>leaking -reconsulted general surgery>>placed new Gtube 03/08/22 -early AM 03/09/22--Gtube "fell out" -discussed with Dr. Morene Rankins meatus closed>>consult IR to place new PEG via old tract -03/10/22--new G-tube placed by IR>>restarted enteral feeding -continuous tube feeding with vital 1.5@ 55 ml/hr per PEG tube -   Stage IV B Metastatic Squamous Cell Cancer of the esophagus - his chemotherapy was placed on temporary hold due to severe weight loss - he is followed by Dr. Delton Coombes -Palliative care consult  appreciated patient request full code/full scope care  -continue to see Dr. Delton Coombes for ongoing discussion after rehabilitation (planning for follow-up in 6 weeks)..  Oncology has also arranged for second opinion evaluation at [facility].   Frequent Falls - likely exacerbated by severe debility, malnutrition, weight loss - fall precautions recommended - PT ordered recommending SNF    Severe protein calorie Malnutrition/FTT in an adult - resume regular diet plus PEG tube feeds  -Electrolyte abnormalities and dehydration concerns as above #1   Refeeding Syndrome -Phosphorous, potassium and magnesium replaced    AKI Resolved - prerenal from severe dehydration  -serum creatinine peaked 1.32 -resolved and is stable currently.   Tobacco  -Continue nicotine patch. - counseled patient on tobacco cessation    Chronic nausea and vomiting - secondary to esophageal malignancy -Continue supportive treatment and as needed antiemetics.   -pt insists upon po intake, will not follow medical advice.    History of cavitary pneumonia - completed a 4 week course of augmentin  - now stable on RA without leukocytosis -Patient is afebrile.   Peripheral neuropathy - secondary to prior chronic alcohol abuse  -Continue the use of gabapentin   Chronic anemia--Hgb currently stable around 9 -No obvious bleeding at this time    Consultants: Oncology, general surgery, IR, palliative   Discharge Condition: stable  Follow UP   Contact information for follow-up providers     Jimmy Footman, Meredith Staggers, MD. Go on 04/05/2022.   Specialty: Hematology and Oncology Why: arrive at 2:45. Contact information: [place] [address] [phone]              Contact information for after-discharge care     [facility] .   Service: Skilled Nursing Contact information: [address] [place] [phone]                     Diet and Activity recommendation:  As advised  Discharge Instructions    Discharge Instructions     Call MD for:  difficulty breathing, headache or visual disturbances   Complete by: As directed    Call MD for:  persistant dizziness or light-headedness   Complete by: As directed    Call MD for:  persistant nausea and vomiting   Complete by: As directed    Call MD for:  severe uncontrolled pain   Complete by: As directed    Call MD for:  temperature >100.4   Complete by: As directed    Diet - low sodium heart healthy   Complete by: As directed    continuous tube feeding with vital 1.5@ 55 ml/hr per PEG tube -   Discharge instructions   Complete by: As directed    1) continuous tube feeding with vital 1.5@ 55 ml/hr per PEG tube -  2) repeat CBC and BMP blood test in about 3 to 4 days from now 3)Avoid ibuprofen/Advil/Aleve/Motrin/Goody Powders/Naproxen/BC powders/Meloxicam/Diclofenac/Indomethacin and other Nonsteroidal anti-inflammatory medications as these  will make you more likely to bleed and can cause stomach ulcers, can also cause Kidney problems.   Increase activity slowly   Complete by: As directed          Discharge Medications     Allergies as of 03/23/2022       Reactions   Pork-derived Products Other (See Comments)   Patient does not eat pork due to his religious belief  Medication List     STOP taking these medications    albuterol 108 (90 Base) MCG/ACT inhaler Commonly known as: VENTOLIN HFA   amLODipine 5 MG tablet Commonly known as: NORVASC   ibuprofen 200 MG tablet Commonly known as: ADVIL   saccharomyces boulardii 250 MG capsule Commonly known as: FLORASTOR   Vitamin D (Ergocalciferol) 1.25 MG (50000 UNIT) Caps capsule Commonly known as: DRISDOL       TAKE these medications    allopurinol 300 MG tablet Commonly known as: ZYLOPRIM Take 1 tablet (300 mg total) by mouth daily.   feeding supplement (VITAL 1.5 CAL) Liqd Place 1,000 mLs into feeding tube continuous. Vital 1.5@ 55 ml/hr per PEG tube - What changed:  how much to take when to take this additional instructions   ferrous sulfate 325 (65 FE) MG EC tablet Take 1 tablet (325 mg total) by mouth daily with breakfast.   fluorouracil CALGB 19622 2,400 mg/m2 in sodium chloride 0.9 % 150 mL Inject 2,400 mg/m2 into the vein over 48 hr. Every 2 weeks   FLUOROURACIL IV Inject into the vein every 14 (fourteen) days.   folic acid 1 MG tablet Commonly known as: FOLVITE Place 1 tablet (1 mg total) into feeding tube daily.   free water Soln Place 200 mLs into feeding tube every 6 (six) hours. What changed:  how much to take when to take this   gabapentin 250 MG/5ML solution Commonly known as: NEURONTIN Place 6 mLs (300 mg total) into feeding tube at bedtime.   LEUCOVORIN CALCIUM IV Inject into the vein every 14 (fourteen) days.   magnesium oxide 400 (240 Mg) MG tablet Commonly known as: MAG-OX Take 1 tablet (400 mg total) by  mouth 2 (two) times daily. What changed: when to take this   megestrol 400 MG/10ML suspension Commonly known as: MEGACE Take 10 mLs (400 mg total) by mouth 2 (two) times daily.   metoprolol tartrate 25 MG tablet Commonly known as: LOPRESSOR Take 0.5 tablets (12.5 mg total) by mouth 2 (two) times daily.   midodrine 5 MG tablet Commonly known as: PROAMATINE Place 1 tablet (5 mg total) into feeding tube 3 (three) times daily with meals.   multivitamin with minerals tablet Take 1 tablet by mouth daily.   nicotine 14 mg/24hr patch Commonly known as: NICODERM CQ - dosed in mg/24 hours Place 1 patch (14 mg total) onto the skin daily as needed (nicotine craving).   OXALIPLATIN IV Inject into the vein every 14 (fourteen) days.   oxyCODONE 5 MG immediate release tablet Commonly known as: Oxy IR/ROXICODONE Place 1 tablet (5 mg total) into feeding tube every 8 (eight) hours as needed for moderate pain.   pantoprazole 40 MG tablet Commonly known as: PROTONIX Take 1 tablet (40 mg total) by mouth daily. Start taking on: March 24, 2022   potassium chloride SA 20 MEQ tablet Commonly known as: Klor-Con M20 Take 1 tablet (20 mEq total) by mouth daily. Start taking on: March 27, 2022 What changed:  how much to take These instructions start on March 27, 2022. If you are unsure what to do until then, ask your doctor or other care provider.   prochlorperazine 10 MG tablet Commonly known as: COMPAZINE Take 1 tablet (10 mg total) by mouth every 6 (six) hours as needed for nausea or vomiting.        Major procedures and Radiology Reports - PLEASE review detailed and final reports for all details, in brief -  IR Replc Gastro/Colonic Tube Percut W/Fluoro  Result Date: 03/10/2022 INDICATION: 51 year old male with a history of surgically placed gastrostomy tube which was inadvertently displaced. Tube could not be replaced at bedside at outside hospital so patient now presents to [facility]  cone interventional radiology for tube replacement. EXAM: GASTROSTOMY CATHETER REPLACEMENT MEDICATIONS: Ancef 2 g ANESTHESIA/SEDATION: Versed 1 mg IV; Fentanyl 25 mcg IV Moderate Sedation Time:  9 minutes The patient's vital signs and level of consciousness were continuously monitored during the procedure by the interventional radiology nurse under my direct supervision. CONTRAST:  90m OMNIPAQUE IOHEXOL 300 MG/ML SOLN - administered into the gastric lumen. FLUOROSCOPY TIME:  Radiation exposure index: 1 mGy reference air kerma COMPLICATIONS: None immediate. PROCEDURE: Informed written consent was obtained from the patient after a thorough discussion of the procedural risks, benefits and alternatives. All questions were addressed. Maximal Sterile Barrier Technique was utilized including caps, mask, sterile gowns, sterile gloves, sterile drape, hand hygiene and skin antiseptic. A timeout was performed prior to the initiation of the procedure. The prior gastrostomy tube ostomy was anesthetized by infiltration with 1% lidocaine. Using a hemostat, the overlying scab was debrided. The hemostat was then used to gently open the ostomy tract. A 5 [place] the catheter was navigated through the ostomy and into the stomach. Contrast was injected confirming that the catheter is indeed within the gastric fundus. A wire was placed. The existing ostomy tract was then dilated to 20 [place] into a 238French percutaneous gastrostomy tube was lubricated and advanced over the wire into the stomach. The retention balloon was filled with 15 mL saline and pulled snug against the anterior abdominal wall. The external bumper was fixed in place. Contrast was injected through the tube confirming its location within the stomach. The tube was then flushed with saline and capped. The patient tolerated the procedure well. IMPRESSION: Successful replacement of 20 French percutaneous gastrostomy tube. Electronically Signed   By: HJacqulynn CadetM.D.   On: 03/10/2022 10:10   DG Chest Portable 1 View  Result Date: 03/02/2022 CLINICAL DATA:  Issues with port EXAM: PORTABLE CHEST 1 VIEW COMPARISON:  02/14/2022, CT 02/14/2022 FINDINGS: Left-sided central venous port tip over the SVC. Tubing appears grossly continuous. No pleural effusion or pneumothorax. Stable cardiomediastinal silhouette. Probable skin fold artifact over the left chest. Multiple old bilateral rib fractures. Mild nodularity in the right upper lobe, likely corresponds to tree-in-bud density on the prior CT. IMPRESSION: 1. Left-sided central venous port tip over the SVC. Port tubing appears grossly continuous. 2. Mild nodularity in the right upper lobe adjacent to the fissure, likely corresponds to tree-in-bud density on prior CT. Electronically Signed   By: KDonavan FoilM.D.   On: 03/02/2022 23:43   CT HEAD WO CONTRAST (5MM)  Result Date: 03/02/2022 CLINICAL DATA:  Head trauma. EXAM: CT HEAD WITHOUT CONTRAST TECHNIQUE: Contiguous axial images were obtained from the base of the skull through the vertex without intravenous contrast. RADIATION DOSE REDUCTION: This exam was performed according to the departmental dose-optimization program which includes automated exposure control, adjustment of the mA and/or kV according to patient size and/or use of iterative reconstruction technique. COMPARISON:  CT Head 02/28/22 FINDINGS: Brain: No evidence of acute infarction, hemorrhage, hydrocephalus, extra-axial collection or mass lesion/mass effect. Sequela of mild chronic microvascular ischemic change. Vascular: No hyperdense vessel or unexpected calcification. Skull: Normal. Negative for fracture or focal lesion. Sinuses/Orbits: No acute finding. Other: None IMPRESSION: No acute intracranial abnormality. Electronically Signed   By:  Marin Roberts M.D.   On: 03/02/2022 19:51   DG Knee 2 Views Left  Result Date: 03/01/2022 CLINICAL DATA:  Trauma EXAM: LEFT KNEE - 1-2 VIEW COMPARISON:   None Available. FINDINGS: Trace knee joint effusion. No fracture. No dislocation. Mild enthesopathic changes of the patella. Sclerotic lesion in the fibula has well-circumscribed margins and is favored to be benign in the absence of other known osseous metastases. No discernible soft tissue abnormality. IMPRESSION: Trace knee joint effusion. No fracture or dislocation. Electronically Signed   By: Marin Roberts M.D.   On: 03/01/2022 08:21   DG Knee 2 Views Right  Result Date: 03/01/2022 CLINICAL DATA:  Fall with patellar pain. EXAM: RIGHT KNEE - 1-2 VIEW COMPARISON:  None Available. FINDINGS: No evidence of regional fracture. No weight-bearing compartment degenerative disease. 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 No other finding of note. IMPRESSION: No acute or traumatic finding. 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 Electronically Signed   By: Nelson Chimes M.D.   On: 03/01/2022 08:18   CT Head Wo Contrast  Result Date: 02/28/2022 CLINICAL DATA:  Head trauma, moderate to severe. [place] getting out of bed with laceration to left eyebrow. EXAM: CT HEAD WITHOUT CONTRAST TECHNIQUE: Contiguous axial images were obtained from the base of the skull through the vertex without intravenous contrast. RADIATION DOSE REDUCTION: This exam was performed according to the departmental dose-optimization program which includes automated exposure control, adjustment of the mA and/or kV according to patient size and/or use of iterative reconstruction technique. COMPARISON:  None. FINDINGS: Brain: No acute intracranial hemorrhage, midline shift or mass effect. No extra-axial fluid collection. Diffuse atrophy is noted. Periventricular white matter hypodensities are noted bilaterally. No hydrocephalus. Vascular: There is atherosclerotic calcification of the carotid siphons. No hyperdense vessel. Skull: Normal. Negative for fracture or focal lesion. Sinuses/Orbits: No acute finding. Other: None.  IMPRESSION: 1. No acute intracranial process. 2. Atrophy with chronic microvascular ischemic changes. Electronically Signed   By: Brett Fairy M.D.   On: 02/28/2022 03:40    Today   Subjective    [name] today has no new complaints - Tolerating PEG tube feeding well  No Nausea, Vomiting or Diarrhea -Tolerating soft oral intake with liquids well -No fever  Or chills           Patient has been seen and examined prior to discharge   Objective   Blood pressure 111/88, pulse (!) 110, temperature 99 F (37.2 C), temperature source Oral, resp. rate 12, height '5\' 6"'$  (1.676 m), weight 40.1 kg, SpO2 100 %.   Intake/Output Summary (Last 24 hours) at 03/23/2022 1311 Last data filed at 03/23/2022 0900 Gross per 24 hour  Intake 240 ml  Output --  Net 240 ml   Exam General exam: Alert, awake, oriented x 3; frail and cachectic appearing  respiratory system: Fair symmetrical air movement, no wheezing  cardiovascular system:RRR. No rubs or gallops.,  Left subclavian area Port-A-Cath in situ Gastrointestinal system: Abdomen is nondistended, soft and nontender.  G-tube in situ Central nervous system: Alert and oriented. No focal neurological deficits. Extremities: Good pedal pulses, no edema Psychiatry: Judgement and insight appear normal. Mood & affect appropriate.   Data Review   CBC w Diff:  Lab Results  Component Value Date   WBC 8.4 03/23/2022   HGB 9.2 (L) 03/23/2022   HCT 30.3 (L) 03/23/2022   PLT 408 (H) 03/23/2022   LYMPHOPCT 22 03/01/2022   MONOPCT 4 03/01/2022   EOSPCT 0 03/01/2022  BASOPCT 0 03/01/2022    CMP:  Lab Results  Component Value Date   NA 134 (L) 03/23/2022   K 5.1 03/23/2022   CL 114 (H) 03/23/2022   CO2 15 (L) 03/23/2022   BUN 47 (H) 03/23/2022   CREATININE 0.81 03/23/2022   PROT 5.7 (L) 03/01/2022   ALBUMIN 2.8 (L) 03/23/2022   BILITOT 0.9 03/01/2022   ALKPHOS 62 03/01/2022   AST 18 03/01/2022   ALT 9 03/01/2022   Total Discharge time is  about 33 minutes  Roxan Hockey M.D on 03/23/2022 at 1:11 PM  Go to [email or web address] -  for contact info  [facility] - Office  [phone]   ...0

## 2022-03-23 NOTE — Patient Outreach (Signed)
  Care Coordination   Pended outreach  Visit Note   03/23/2022 Name: Trevor [name] MRN: [id number] DOB: 1970-09-24  Trevor [name] is a 51 y.o. year old male who sees [name], [place] for primary care. I  noted scheduled discharge home with refusal to be admitted to [facility]  What matters to the patients health and wellness today?  Remains hospitalized since 03/01/22 with pending discharge 03/23/22 home   Goals Addressed               This Visit's Progress     Patient Stated     Improve Nutriton/increase weight (THN) (pt-stated)   On track     Care Coordination Interventions: Admissions for falls, nausea & vomiting, poor fluid PEG intake and electrolyte imbalances noted and will follow patient ambulatory, pending discharge plans [facility] case discussion template updated Patient has been hospitalized for the last 6 scheduled [facility] RN CM outreach. Will continue to successfully assist patient when in ambulatory setting  Noted patient is now on continuous tube feeding with vital 1.5@ 55 ml/hr per PEG tube       Manage the worsening symptoms of Esophageal cancer (THN) (pt-stated)        Care Coordination Interventions: Patient has been hospitalized for the last 6 scheduled [facility] RN CM outreach. Will continue to successfully assist patient when in ambulatory setting Pending discharge home on 03/23/22 vs admission to [place]         SDOH assessments and interventions completed:  No     Care Coordination Interventions:  Yes, provided   Follow up plan: Follow up call scheduled for 03/24/22 10 am    Encounter Outcome:  Pt. Visit Completed   Trevor Magnone L. Lavina Hamman, RN, BSN, [place] Coordinator Office number [phone]

## 2022-03-23 NOTE — Progress Notes (Signed)
[facility] was contacted by telephone and message left notifying them of appointments in the cancer center on 05/02/2022.    Transportation to appointments were confirmed for the patient as being  [employer] .

## 2022-03-23 NOTE — Progress Notes (Signed)
Report was called to [place].

## 2022-03-23 NOTE — Discharge Instructions (Signed)
1) continuous tube feeding with vital 1.5@ 55 ml/hr per PEG tube -  2) repeat CBC and BMP blood test in about 3 to 4 days from now 3)Avoid ibuprofen/Advil/Aleve/Motrin/Goody Powders/Naproxen/BC powders/Meloxicam/Diclofenac/Indomethacin and other Nonsteroidal anti-inflammatory medications as these will make you more likely to bleed and can cause stomach ulcers, can also cause Kidney problems.

## 2022-03-24 ENCOUNTER — Ambulatory Visit: Payer: Self-pay | Admitting: *Deleted

## 2022-03-24 DIAGNOSIS — E878 Other disorders of electrolyte and fluid balance, not elsewhere classified: Secondary | ICD-10-CM | POA: Diagnosis not present

## 2022-03-24 DIAGNOSIS — C159 Malignant neoplasm of esophagus, unspecified: Secondary | ICD-10-CM | POA: Diagnosis not present

## 2022-03-24 DIAGNOSIS — N179 Acute kidney failure, unspecified: Secondary | ICD-10-CM | POA: Diagnosis not present

## 2022-03-24 DIAGNOSIS — R296 Repeated falls: Secondary | ICD-10-CM | POA: Diagnosis not present

## 2022-03-24 DIAGNOSIS — I959 Hypotension, unspecified: Secondary | ICD-10-CM | POA: Diagnosis not present

## 2022-03-24 DIAGNOSIS — E43 Unspecified severe protein-calorie malnutrition: Secondary | ICD-10-CM | POA: Diagnosis not present

## 2022-03-24 DIAGNOSIS — M6281 Muscle weakness (generalized): Secondary | ICD-10-CM | POA: Diagnosis not present

## 2022-03-24 NOTE — Patient Outreach (Signed)
  Care Coordination   Follow Up Visit Note   03/24/2022 Name: Trevor [name] MRN: [id number] DOB: Sep 02, 1970  Trevor [name] is a 51 y.o. year old male who sees Trevor [name], [place] for primary care. I  was not able to reach Trevor [name] at his preferred number but his brother answered his listed mobile number   What matters to the patients health and wellness today?  Remains hospitalized at [facility] per his brother, Trevor [name] who informed RN CM the patient never left the hospital  His EPIC notes on 03/23/22 indicates he went to [facility] center outpatient Rehab via [facility] transportation    Goals Addressed   None     SDOH assessments and interventions completed:  No     Care Coordination Interventions:  Yes, provided   Follow up plan: Follow up call scheduled for 04/07/22 1000    Encounter Outcome:  Pt. Visit Completed   Cristy Colmenares L. Lavina Hamman, RN, BSN, [place] Coordinator Office number [phone]

## 2022-03-27 DIAGNOSIS — E43 Unspecified severe protein-calorie malnutrition: Secondary | ICD-10-CM | POA: Diagnosis not present

## 2022-03-30 DIAGNOSIS — E870 Hyperosmolality and hypernatremia: Secondary | ICD-10-CM | POA: Diagnosis not present

## 2022-03-30 DIAGNOSIS — E860 Dehydration: Secondary | ICD-10-CM | POA: Diagnosis not present

## 2022-03-30 DIAGNOSIS — C159 Malignant neoplasm of esophagus, unspecified: Secondary | ICD-10-CM | POA: Diagnosis not present

## 2022-03-30 DIAGNOSIS — D649 Anemia, unspecified: Secondary | ICD-10-CM | POA: Diagnosis not present

## 2022-03-30 DIAGNOSIS — I959 Hypotension, unspecified: Secondary | ICD-10-CM | POA: Diagnosis not present

## 2022-03-30 DIAGNOSIS — E43 Unspecified severe protein-calorie malnutrition: Secondary | ICD-10-CM | POA: Diagnosis not present

## 2022-03-30 DIAGNOSIS — K9423 Gastrostomy malfunction: Secondary | ICD-10-CM | POA: Diagnosis not present

## 2022-03-30 DIAGNOSIS — R296 Repeated falls: Secondary | ICD-10-CM | POA: Diagnosis not present

## 2022-03-31 DIAGNOSIS — C155 Malignant neoplasm of lower third of esophagus: Secondary | ICD-10-CM | POA: Diagnosis not present

## 2022-04-03 DIAGNOSIS — R296 Repeated falls: Secondary | ICD-10-CM | POA: Diagnosis not present

## 2022-04-03 DIAGNOSIS — E878 Other disorders of electrolyte and fluid balance, not elsewhere classified: Secondary | ICD-10-CM | POA: Diagnosis not present

## 2022-04-03 DIAGNOSIS — M6281 Muscle weakness (generalized): Secondary | ICD-10-CM | POA: Diagnosis not present

## 2022-04-03 DIAGNOSIS — I959 Hypotension, unspecified: Secondary | ICD-10-CM | POA: Diagnosis not present

## 2022-04-03 DIAGNOSIS — C159 Malignant neoplasm of esophagus, unspecified: Secondary | ICD-10-CM | POA: Diagnosis not present

## 2022-04-03 DIAGNOSIS — E43 Unspecified severe protein-calorie malnutrition: Secondary | ICD-10-CM | POA: Diagnosis not present

## 2022-04-04 ENCOUNTER — Emergency Department (HOSPITAL_COMMUNITY): Payer: 59

## 2022-04-04 ENCOUNTER — Emergency Department (HOSPITAL_COMMUNITY)
Admission: EM | Admit: 2022-04-04 | Discharge: 2022-04-04 | Disposition: A | Discharge status: Skilled Nursing Facility | Payer: 59 | Attending: Emergency Medicine | Admitting: Emergency Medicine

## 2022-04-04 ENCOUNTER — Encounter (HOSPITAL_COMMUNITY): Payer: Self-pay | Admitting: *Deleted

## 2022-04-04 ENCOUNTER — Other Ambulatory Visit: Payer: Self-pay

## 2022-04-04 DIAGNOSIS — K9423 Gastrostomy malfunction: Secondary | ICD-10-CM | POA: Diagnosis not present

## 2022-04-04 DIAGNOSIS — Z79899 Other long term (current) drug therapy: Secondary | ICD-10-CM | POA: Insufficient documentation

## 2022-04-04 DIAGNOSIS — Z431 Encounter for attention to gastrostomy: Secondary | ICD-10-CM | POA: Diagnosis not present

## 2022-04-04 DIAGNOSIS — C772 Secondary and unspecified malignant neoplasm of intra-abdominal lymph nodes: Secondary | ICD-10-CM | POA: Diagnosis not present

## 2022-04-04 DIAGNOSIS — Z8501 Personal history of malignant neoplasm of esophagus: Secondary | ICD-10-CM | POA: Insufficient documentation

## 2022-04-04 DIAGNOSIS — C155 Malignant neoplasm of lower third of esophagus: Secondary | ICD-10-CM | POA: Diagnosis not present

## 2022-04-04 DIAGNOSIS — Z515 Encounter for palliative care: Secondary | ICD-10-CM | POA: Diagnosis not present

## 2022-04-04 DIAGNOSIS — Z4682 Encounter for fitting and adjustment of non-vascular catheter: Secondary | ICD-10-CM | POA: Diagnosis not present

## 2022-04-04 MED ORDER — DIATRIZOATE MEGLUMINE & SODIUM 66-10 % PO SOLN
ORAL | Status: AC
  Filled 2022-04-04: qty 30

## 2022-04-04 NOTE — Discharge Instructions (Signed)
Your tube has been downsized to a 16 [place] since the 20 would not fit.

## 2022-04-04 NOTE — ED Triage Notes (Signed)
Pt brought in by [facility] from [place] for c/o feeding tube becoming dislodged; pt was up to bathroom and pt accidentally pulled out tube  Pt denies any pain

## 2022-04-04 NOTE — ED Notes (Signed)
EDP placed 16Fr standard balloon replacement tube in pt's gastric opening. Pt awaiting x-ray at this time.

## 2022-04-04 NOTE — ED Notes (Signed)
Pt awaiting transport back to [facility] via ambulance. C-com notified by nurse secretary.

## 2022-04-04 NOTE — ED Notes (Signed)
Pt refused vital signs prior to discharge. Pt agitated and cursing at staff because he does not yet have a meal tray. Meal tray has been ordered and dietary staff notified of need by phone.

## 2022-04-04 NOTE — ED Provider Notes (Signed)
[facility] EMERGENCY DEPARTMENT Provider Note   CSN: [id number] Arrival date & time: 04/04/22  1508     History  Chief Complaint  Patient presents with   feeding tube pulled out    Trevor [name] is a 51 y.o. male.  HPI Patient has PEG tube for continuous feedings.  Does not eat orally due to the esophageal cancer.  Tube pulled out today after going to the bathroom.  Had a 20 [place] tube placed.  It was replaced back in June by Dr. Arnoldo Morale.    Home Medications Prior to Admission medications   Medication Sig Start Date End Date Taking? Authorizing Provider  allopurinol (ZYLOPRIM) 300 MG tablet Take 1 tablet (300 mg total) by mouth daily. 08/04/21   Sanjuana Kava, MD  ferrous sulfate 325 (65 FE) MG EC tablet Take 1 tablet (325 mg total) by mouth daily with breakfast. 03/23/22   Roxan Hockey, MD  fluorouracil CALGB 32671 2,400 mg/m2 in sodium chloride 0.9 % 150 mL Inject 2,400 mg/m2 into the vein over 48 hr. Every 2 weeks    [provider]  FLUOROURACIL IV Inject into the vein every 14 (fourteen) days.    [provider]  folic acid (FOLVITE) 1 MG tablet Place 1 tablet (1 mg total) into feeding tube daily. 03/13/22   Orson Eva, MD  gabapentin (NEURONTIN) 250 MG/5ML solution Place 6 mLs (300 mg total) into feeding tube at bedtime. 02/22/22   Pokhrel, Corrie Mckusick, MD  LEUCOVORIN CALCIUM IV Inject into the vein every 14 (fourteen) days.    [provider]  magnesium oxide (MAG-OX) 400 (240 Mg) MG tablet Take 1 tablet (400 mg total) by mouth 2 (two) times daily. Patient taking differently: Take 1 tablet by mouth daily. 01/30/22 04/30/22  [name], [name]  megestrol (MEGACE) 400 MG/10ML suspension Take 10 mLs (400 mg total) by mouth 2 (two) times daily. 12/07/21   Derek Jack, MD  metoprolol tartrate (LOPRESSOR) 25 MG tablet Take 0.5 tablets (12.5 mg total) by mouth 2 (two) times daily. 03/23/22   Roxan Hockey, MD  midodrine (PROAMATINE) 5 MG tablet  Place 1 tablet (5 mg total) into feeding tube 3 (three) times daily with meals. 03/12/22   Orson Eva, MD  Multiple Vitamins-Minerals (MULTIVITAMIN WITH MINERALS) tablet Take 1 tablet by mouth daily. 03/23/22 03/23/23  Roxan Hockey, MD  nicotine (NICODERM CQ - DOSED IN MG/24 HOURS) 14 mg/24hr patch Place 1 patch (14 mg total) onto the skin daily as needed (nicotine craving). 03/12/22   Orson Eva, MD  Nutritional Supplements (FEEDING SUPPLEMENT, VITAL 1.5 CAL,) LIQD Place 1,000 mLs into feeding tube continuous. Vital 1.5@ 55 ml/hr per PEG tube - 03/23/22   Emokpae, Courage, MD  OXALIPLATIN IV Inject into the vein every 14 (fourteen) days.    [provider]  oxyCODONE (OXY IR/ROXICODONE) 5 MG immediate release tablet Place 1 tablet (5 mg total) into feeding tube every 8 (eight) hours as needed for moderate pain. 03/23/22   Roxan Hockey, MD  pantoprazole (PROTONIX) 40 MG tablet Take 1 tablet (40 mg total) by mouth daily. 03/24/22   Roxan Hockey, MD  potassium chloride SA (KLOR-CON M20) 20 MEQ tablet Take 1 tablet (20 mEq total) by mouth daily. 03/27/22   Roxan Hockey, MD  prochlorperazine (COMPAZINE) 10 MG tablet Take 1 tablet (10 mg total) by mouth every 6 (six) hours as needed for nausea or vomiting. 10/27/21   Derek Jack, MD  Water For Irrigation, Sterile (FREE WATER) SOLN Place 200 mLs  into feeding tube every 6 (six) hours. 03/23/22   Roxan Hockey, MD      Allergies    Pork-derived products    Review of Systems   Review of Systems  Physical Exam Updated Vital Signs BP (!) 95/57 (BP Location: Right Arm)   Pulse 90   Temp 97.7 F (36.5 C) (Oral)   Resp 17   Ht '5\' 6"'$  (1.676 m)   Wt 40.8 kg   SpO2 99%   BMI 14.53 kg/m  Physical Exam Vitals and nursing note reviewed.  Abdominal:     Comments: Ostomy in left upper abdomen.  Some tube feed at the site.  Musculoskeletal:     Cervical back: Neck supple.  Neurological:     Mental Status: He is alert.  Mental status is at baseline.     ED Results / Procedures / Treatments   Labs (all labs ordered are listed, but only abnormal results are displayed) Labs Reviewed - No data to display  EKG None  Radiology DG ABDOMEN PEG TUBE LOCATION  Result Date: 04/04/2022 CLINICAL DATA:  Peg tube malfunction EXAM: ABDOMEN - 1 VIEW COMPARISON:  None Available. FINDINGS: Supine frontal view of the abdomen and pelvis excludes the hemidiaphragms by collimation. Peg tube overlies the left upper quadrant. Contrast administered by the ordering clinician outlines the gastric rugal folds. No evidence of contrast extravasation. The bowel gas pattern is unremarkable. IMPRESSION: 1. Administered contrast outlining the gastric rugal fold, confirming intraluminal placement of the PEG tube. Electronically Signed   By: Randa Ngo M.D.   On: 04/04/2022 17:15    Procedures Procedures    Medications Ordered in ED Medications  diatrizoate meglumine-sodium (GASTROGRAFIN) 66-10 % solution (has no administration in time range)    ED Course/ Medical Decision Making/ A&P                           Medical Decision Making Amount and/or Complexity of Data Reviewed Radiology: ordered.     Patient with pain to knees that have come out.  It was a 62 [place].  Attempted to place a 20 French PEG tube but however was too tight.  10 French Foley with placed as a temporizing measure.  Discussed with Dr. Constance Haw from general surgery.  Will attempt to increase the size of 2 potentially to a 16 [place] feeding tube.  Can follow-up as an outpatient and should be able to feed even through 14 if needed.  16 French feeding tube obtained.  Was able to replace verification by x-ray.  Will discharge home.           Final Clinical Impression(s) / ED Diagnoses Final diagnoses:  PEG (percutaneous endoscopic gastrostomy) adjustment/replacement/removal [facility])    Rx / [place] Orders ED Discharge Orders     None          Davonna Belling, MD 04/04/22 1742

## 2022-04-05 ENCOUNTER — Emergency Department (HOSPITAL_COMMUNITY)
Admission: EM | Admit: 2022-04-05 | Discharge: 2022-04-05 | Disposition: A | Discharge status: Skilled Nursing Facility | Payer: 59 | Attending: Emergency Medicine | Admitting: Emergency Medicine

## 2022-04-05 ENCOUNTER — Encounter (HOSPITAL_COMMUNITY): Payer: Self-pay | Admitting: Emergency Medicine

## 2022-04-05 ENCOUNTER — Other Ambulatory Visit: Payer: Self-pay

## 2022-04-05 DIAGNOSIS — I10 Essential (primary) hypertension: Secondary | ICD-10-CM | POA: Insufficient documentation

## 2022-04-05 DIAGNOSIS — Z8501 Personal history of malignant neoplasm of esophagus: Secondary | ICD-10-CM | POA: Insufficient documentation

## 2022-04-05 DIAGNOSIS — Z79899 Other long term (current) drug therapy: Secondary | ICD-10-CM | POA: Insufficient documentation

## 2022-04-05 DIAGNOSIS — K9423 Gastrostomy malfunction: Secondary | ICD-10-CM

## 2022-04-05 DIAGNOSIS — E119 Type 2 diabetes mellitus without complications: Secondary | ICD-10-CM | POA: Diagnosis not present

## 2022-04-05 NOTE — ED Notes (Signed)
On hold for 3rd time with Nashya Garlington to call report on patient. Patient has been picked up by transport and is headed back to facility. RN made receptionist aware.

## 2022-04-05 NOTE — ED Triage Notes (Signed)
Pt presented to ED via [facility] rescue with c/o a displaced gastric tube that was placed yesterday. Tube is in place without bulb inflated.

## 2022-04-05 NOTE — ED Provider Notes (Signed)
[facility] EMERGENCY DEPARTMENT Provider Note   CSN: [id number] Arrival date & time: 04/05/22  1696     History  Chief Complaint  Patient presents with   GI Problem    Pt removed GI tube    Trevor [name] is a 51 y.o. male.   GI Problem   51 year old male presents emergency department with complaints of PEG tube problems.  Patient with PEG tube secondary to esophageal cancer.  Reports having temporary one placed yesterday of which she states "has come out but he has put it back in."  Patient states he is also without breakfast and is requesting food.  He is requesting general surgeon to see him today to put in PEG tube.  Denies abdominal pain, nausea, vomiting, fever, chills, night sweats.  Past medical history significant for esophageal cancer, diabetes mellitus, hypertension  Home Medications Prior to Admission medications   Medication Sig Start Date End Date Taking? Authorizing Provider  allopurinol (ZYLOPRIM) 300 MG tablet Take 1 tablet (300 mg total) by mouth daily. 08/04/21   Sanjuana Kava, MD  ferrous sulfate 325 (65 FE) MG EC tablet Take 1 tablet (325 mg total) by mouth daily with breakfast. 03/23/22   Roxan Hockey, MD  fluorouracil CALGB 78938 2,400 mg/m2 in sodium chloride 0.9 % 150 mL Inject 2,400 mg/m2 into the vein over 48 hr. Every 2 weeks    [provider]  FLUOROURACIL IV Inject into the vein every 14 (fourteen) days.    [provider]  folic acid (FOLVITE) 1 MG tablet Place 1 tablet (1 mg total) into feeding tube daily. 03/13/22   Orson Eva, MD  gabapentin (NEURONTIN) 250 MG/5ML solution Place 6 mLs (300 mg total) into feeding tube at bedtime. 02/22/22   Pokhrel, Corrie Mckusick, MD  LEUCOVORIN CALCIUM IV Inject into the vein every 14 (fourteen) days.    [provider]  magnesium oxide (MAG-OX) 400 (240 Mg) MG tablet Take 1 tablet (400 mg total) by mouth 2 (two) times daily. Patient taking differently: Take 1 tablet by mouth daily. 01/30/22  04/30/22  [name], [name]  megestrol (MEGACE) 400 MG/10ML suspension Take 10 mLs (400 mg total) by mouth 2 (two) times daily. 12/07/21   Derek Jack, MD  metoprolol tartrate (LOPRESSOR) 25 MG tablet Take 0.5 tablets (12.5 mg total) by mouth 2 (two) times daily. 03/23/22   Roxan Hockey, MD  midodrine (PROAMATINE) 5 MG tablet Place 1 tablet (5 mg total) into feeding tube 3 (three) times daily with meals. 03/12/22   Orson Eva, MD  Multiple Vitamins-Minerals (MULTIVITAMIN WITH MINERALS) tablet Take 1 tablet by mouth daily. 03/23/22 03/23/23  Roxan Hockey, MD  nicotine (NICODERM CQ - DOSED IN MG/24 HOURS) 14 mg/24hr patch Place 1 patch (14 mg total) onto the skin daily as needed (nicotine craving). 03/12/22   Orson Eva, MD  Nutritional Supplements (FEEDING SUPPLEMENT, VITAL 1.5 CAL,) LIQD Place 1,000 mLs into feeding tube continuous. Vital 1.5@ 55 ml/hr per PEG tube - 03/23/22   Emokpae, Courage, MD  OXALIPLATIN IV Inject into the vein every 14 (fourteen) days.    [provider]  oxyCODONE (OXY IR/ROXICODONE) 5 MG immediate release tablet Place 1 tablet (5 mg total) into feeding tube every 8 (eight) hours as needed for moderate pain. 03/23/22   Roxan Hockey, MD  pantoprazole (PROTONIX) 40 MG tablet Take 1 tablet (40 mg total) by mouth daily. 03/24/22   Roxan Hockey, MD  potassium chloride SA (KLOR-CON M20) 20 MEQ tablet Take 1 tablet (20  mEq total) by mouth daily. 03/27/22   Roxan Hockey, MD  prochlorperazine (COMPAZINE) 10 MG tablet Take 1 tablet (10 mg total) by mouth every 6 (six) hours as needed for nausea or vomiting. 10/27/21   Derek Jack, MD  Water For Irrigation, Sterile (FREE WATER) SOLN Place 200 mLs into feeding tube every 6 (six) hours. 03/23/22   Roxan Hockey, MD      Allergies    Pork-derived products    Review of Systems   Review of Systems  Physical Exam Updated Vital Signs BP 106/82 (BP Location: Left Arm)   Pulse (!) 105    Temp 98.9 F (37.2 C) (Oral)   Resp 18   SpO2 100%  Physical Exam Vitals and nursing note reviewed.  Constitutional:      General: He is not in acute distress.    Appearance: He is well-developed.  HENT:     Head: Normocephalic and atraumatic.  Eyes:     Conjunctiva/sclera: Conjunctivae normal.  Cardiovascular:     Rate and Rhythm: Normal rate and regular rhythm.     Heart sounds: No murmur heard. Pulmonary:     Effort: Pulmonary effort is normal. No respiratory distress.     Breath sounds: Normal breath sounds.  Abdominal:     Palpations: Abdomen is soft.     Tenderness: There is no abdominal tenderness.     Comments: Peg tube in place.  5 French with 6 mm cc balloon to be tested for inflation as well as patency and correct placement.  Skin without surrounding erythema, induration, tenderness or palpable fluctuance.  No obvious discharge noted.  Musculoskeletal:        General: No swelling.     Cervical back: Neck supple.  Skin:    General: Skin is warm and dry.     Capillary Refill: Capillary refill takes less than 2 seconds.  Neurological:     Mental Status: He is alert.  Psychiatric:        Mood and Affect: Mood normal.     ED Results / Procedures / Treatments   Labs (all labs ordered are listed, but only abnormal results are displayed) Labs Reviewed - No data to display  EKG None  Radiology DG ABDOMEN PEG TUBE LOCATION  Result Date: 04/04/2022 CLINICAL DATA:  Peg tube malfunction EXAM: ABDOMEN - 1 VIEW COMPARISON:  None Available. FINDINGS: Supine frontal view of the abdomen and pelvis excludes the hemidiaphragms by collimation. Peg tube overlies the left upper quadrant. Contrast administered by the ordering clinician outlines the gastric rugal folds. No evidence of contrast extravasation. The bowel gas pattern is unremarkable. IMPRESSION: 1. Administered contrast outlining the gastric rugal fold, confirming intraluminal placement of the PEG tube. Electronically  Signed   By: Randa Ngo M.D.   On: 04/04/2022 17:15    Procedures Procedures    Medications Ordered in ED Medications - No data to display  ED Course/ Medical Decision Making/ A&P                           Medical Decision Making  This patient presents to the ED for concern of feeding tube malfunction, this involves an extensive number of treatment options, and is a complaint that carries with it a high risk of complications and morbidity.  The differential diagnosis includes feeding tube malfunction   Co morbidities that complicate the patient evaluation  See HPI   Additional history obtained:  Additional history obtained  from EMR External records from outside source obtained and reviewed including hospital records   Lab Tests:  N/a   Imaging Studies ordered:  N/a   Cardiac Monitoring: / EKG:  The patient was maintained on a cardiac monitor.  I personally viewed and interpreted the cardiac monitored which showed an underlying rhythm of: sinus rhythm   Consultations Obtained:  See ED course   Problem List / ED Course / Critical interventions / Medication management  G-tube malfunction Reevaluation of the patient showed that the patient improved I have reviewed the patients home medicines and have made adjustments as needed   Social Determinants of Health:  Some cigarette use.  Denies illicit drug use.   Test / Admission - Considered:  G-tube malfunction Vitals signs significant for mildly tachycardic initially with a heart rate of 150 which decreased with time labs on emergency department.. Otherwise within normal range and stable throughout visit. G-tube replaced by general surgery Dr. Arnoldo Morale while in the emergency department.  Patient recommended continuation of at home regimen of meal supplementation.  Discussed routine follow-up with general surgery.  Treatment plan discussed with patient and he did not understand was agreeable to said  plan. Worrisome signs and symptoms were discussed with the patient, and the patient acknowledged understanding to return to the ED if noticed. Patient was stable upon discharge. '        Final Clinical Impression(s) / ED Diagnoses Final diagnoses:  Gastrostomy tube dysfunction [facility])    Rx / DC Orders ED Discharge Orders     None         Wilnette Kales, [place] 04/05/22 1253    Milton Ferguson, MD 04/05/22 1750

## 2022-04-05 NOTE — Consult Note (Signed)
Patient presents for second time to the emergency room after his gastrostomy tube fell out.  He noted the balloon was not inflated.  I replaced his G-tube with an 47 French Bard tube.  6cc of  water was instilled into the balloon.  Gastric contents were aspirated to confirm placement.  The bolster was placed at the 2.5 cm Jahayra Mazo.

## 2022-04-05 NOTE — ED Notes (Signed)
Facility nurse not answering call to take report. Currently on hold for 10 minutes. Facility would not answer from an [place] number. RN called from personal number and facility picked up on the 2nd ring. Transferred back to accepting nurse 3 times and each time nurse left this RN on hold for 10 minutes and then sent to VM.

## 2022-04-05 NOTE — Discharge Instructions (Addendum)
At the visit the emergency department today was overall reassuring.  Keep your upcoming appointment with general surgery for reevaluation.  Please not hesitate to return to emergency department for worrisome signs and symptoms we discussed become apparent.

## 2022-04-05 NOTE — ED Notes (Signed)
Unsuccessful attempt to call report to [place].

## 2022-04-07 ENCOUNTER — Ambulatory Visit: Payer: Self-pay | Admitting: *Deleted

## 2022-04-07 DIAGNOSIS — K9423 Gastrostomy malfunction: Secondary | ICD-10-CM | POA: Diagnosis not present

## 2022-04-07 NOTE — Patient Outreach (Signed)
  Care Coordination   Follow Up Visit Note   04/07/2022 Name: Trevor [name] MRN: [id number] DOB: 03-13-1971  Trevor [name] is a 51 y.o. year old male who sees Simpson-Tarokh, Leann, DO for primary care. I spoke with  [name] by phone today.  What matters to the patients health and wellness today?  Remains at the [facility] center for Nursing & rehab facility across the street from [facility] but with 2 ED visits this week related to his G tube status  He reports he is doing fair,  having issues with his feeding tube Voiced understanding of RN CM outreach and future follow up when at home      Goals Addressed               This Visit's Progress     Patient Stated     Improve Nutriton/increase weight (THN) (pt-stated)   On track     Care Coordination Interventions: Admissions for falls, nausea & vomiting, poor fluid PEG intake and electrolyte imbalances noted and will follow patient ambulatory, pending discharge plans [facility] case discussion template updated Patient confirms he presently is at [facility] facility but with ED visits related to his G tube       Manage the worsening symptoms of Esophageal cancer (THN) (pt-stated)   On track     Care Coordination Interventions: Patient has been hospitalized frequently at time of scheduled [facility] RN CM outreach. Will continue to successfully assist patient when in ambulatory setting Pending discharge home  Patient confirms he presently is at [facility] facility but with ED visits related to his G tube         SDOH assessments and interventions completed:  No     Care Coordination Interventions:  Yes, provided   Follow up plan: Follow up call scheduled for 05/12/22    Encounter Outcome:  Pt. Visit Completed    Seham Gardenhire L. Lavina Hamman, RN, BSN, [place] Coordinator Office number [phone]

## 2022-04-07 NOTE — Patient Instructions (Addendum)
Visit Information  Thank you for taking time to visit with me today. Please don't hesitate to contact me if I can be of assistance to you.   Following are the goals we discussed today:   Goals Addressed               This Visit's Progress     Patient Stated     Improve Nutriton/increase weight (THN) (pt-stated)   On track     Care Coordination Interventions: Admissions for falls, nausea & vomiting, poor fluid PEG intake and electrolyte imbalances noted and will follow patient ambulatory, pending discharge plans [facility] case discussion template updated Patient confirms he presently is at [facility] facility but with ED visits related to his G tube       Manage the worsening symptoms of Esophageal cancer (THN) (pt-stated)   On track     Care Coordination Interventions: Patient has been hospitalized frequently at time of scheduled [facility] RN CM outreach. Will continue to successfully assist patient when in ambulatory setting Pending discharge home  Patient confirms he presently is at [facility] facility but with ED visits related to his G tube         Our next appointment is by telephone on 05/12/22 at 1130  Please call the care guide team at [phone] if you need to cancel or reschedule your appointment.   If you are experiencing a Mental Health or Worthing or need someone to talk to, please call the Suicide and Crisis Lifeline: 988 call the [place] National Suicide Prevention Lifeline: 223-505-5178 or TTY: (870)471-2165 TTY 470-727-6149) to talk to a trained counselor call 1-800-273-TALK (toll free, 24 hour hotline) call the [facility]: (985)279-3614 call 911   The patient verbalized understanding of instructions, educational materials, and care plan provided today and DECLINED offer to receive copy of patient instructions, educational materials, and care plan.   The patient has been provided with contact information for the care management  team and has been advised to call with any health related questions or concerns.    Kayal Mula L. Lavina Hamman, RN, BSN, [place] Coordinator Office number [phone]

## 2022-04-07 NOTE — Patient Outreach (Signed)
  Care Coordination TOC Note Transition Care Management Follow-up Telephone Call Date of discharge and from where: [facility] ED on 04/05/22 back to [facility] facility How have you been since you were released from the hospital? Okay Any questions or concerns? No  Items Reviewed: Did the pt receive and understand the discharge instructions provided? Yes  Medications obtained and verified? Yes  Other?  Inquired about [place] RN CM services Any new allergies since your discharge? No  Dietary orders reviewed? Yes Do you have support at home? No  Brother available but limited   Home Care and Equipment/Supplies: Were home health services ordered? not applicable If so, what is the name of the agency? N/a  Has the agency set up a time to come to the patient's home? not applicable Were any new equipment or medical supplies ordered?  No What is the name of the medical supply agency? N/a Were you able to get the supplies/equipment? not applicable Do you have any questions related to the use of the equipment or supplies? No  Functional Questionnaire: (I = Independent and D = Dependent) ADLs: D  Bathing/Dressing- D  Meal Prep- D  Eating- I  Maintaining continence- I  Transferring/Ambulation- D  Managing Meds- D  Follow up appointments reviewed:  PCP Hospital f/u appt confirmed?  At [facility] facility  Scheduled to see snf MD  on 04/06/22 @ during rounds. [facility] f/u appt confirmed?  Transfer to any specialist from facility  Scheduled to see pending on pending @ pending. Are transportation arrangements needed? Yes  If their condition worsens, is the pt aware to call PCP or go to the Emergency Dept.? Yes Was the patient provided with contact information for the PCP's office or ED? Yes Was to pt encouraged to call back with questions or concerns? Yes  SDOH assessments and interventions completed:   No   Care Coordination Interventions:  No Care  Coordination interventions needed at this time.   Encounter Outcome:  Pt. Visit Completed    Anet [place] L. Lavina Hamman, RN, BSN, [place] Coordinator Office number [phone]

## 2022-04-09 ENCOUNTER — Other Ambulatory Visit: Payer: Self-pay

## 2022-04-10 DIAGNOSIS — L853 Xerosis cutis: Secondary | ICD-10-CM | POA: Diagnosis not present

## 2022-04-11 ENCOUNTER — Telehealth: Payer: Self-pay | Admitting: Hematology

## 2022-04-11 NOTE — Telephone Encounter (Signed)
Pt called this morning inquiring about getting gift cards from his [employer] . I advised him that he is not longer in active tx and is not eligible for the balance. Pt was very rude and insistent he get cards for his water bill and nutrition for his feeding tube. After the phone called ended, I reviewed his chart and was advised that the pt is  living in a palliative facility.

## 2022-04-12 DIAGNOSIS — U071 COVID-19: Secondary | ICD-10-CM | POA: Diagnosis not present

## 2022-04-24 DIAGNOSIS — Z419 Encounter for procedure for purposes other than remedying health state, unspecified: Secondary | ICD-10-CM | POA: Diagnosis not present

## 2022-04-25 DIAGNOSIS — C159 Malignant neoplasm of esophagus, unspecified: Secondary | ICD-10-CM | POA: Diagnosis not present

## 2022-04-26 DIAGNOSIS — C159 Malignant neoplasm of esophagus, unspecified: Secondary | ICD-10-CM | POA: Diagnosis not present

## 2022-04-26 DIAGNOSIS — I959 Hypotension, unspecified: Secondary | ICD-10-CM | POA: Diagnosis not present

## 2022-04-26 DIAGNOSIS — K9423 Gastrostomy malfunction: Secondary | ICD-10-CM | POA: Diagnosis not present

## 2022-05-02 ENCOUNTER — Other Ambulatory Visit: Payer: 59

## 2022-05-02 ENCOUNTER — Ambulatory Visit: Payer: 59 | Admitting: Hematology

## 2022-05-02 ENCOUNTER — Inpatient Hospital Stay: Payer: 59

## 2022-05-02 DIAGNOSIS — C159 Malignant neoplasm of esophagus, unspecified: Secondary | ICD-10-CM | POA: Diagnosis not present

## 2022-05-05 DIAGNOSIS — R918 Other nonspecific abnormal finding of lung field: Secondary | ICD-10-CM | POA: Diagnosis not present

## 2022-05-05 DIAGNOSIS — C159 Malignant neoplasm of esophagus, unspecified: Secondary | ICD-10-CM | POA: Diagnosis not present

## 2022-05-05 DIAGNOSIS — C158 Malignant neoplasm of overlapping sites of esophagus: Secondary | ICD-10-CM | POA: Diagnosis not present

## 2022-05-06 DIAGNOSIS — R634 Abnormal weight loss: Secondary | ICD-10-CM | POA: Diagnosis not present

## 2022-05-06 DIAGNOSIS — R627 Adult failure to thrive: Secondary | ICD-10-CM | POA: Diagnosis not present

## 2022-05-06 DIAGNOSIS — Z91199 Patient's noncompliance with other medical treatment and regimen due to unspecified reason: Secondary | ICD-10-CM | POA: Diagnosis not present

## 2022-05-09 DIAGNOSIS — Z79899 Other long term (current) drug therapy: Secondary | ICD-10-CM | POA: Diagnosis not present

## 2022-05-09 DIAGNOSIS — Z87891 Personal history of nicotine dependence: Secondary | ICD-10-CM | POA: Diagnosis not present

## 2022-05-09 DIAGNOSIS — C155 Malignant neoplasm of lower third of esophagus: Secondary | ICD-10-CM | POA: Diagnosis not present

## 2022-05-09 DIAGNOSIS — E871 Hypo-osmolality and hyponatremia: Secondary | ICD-10-CM | POA: Diagnosis not present

## 2022-05-09 DIAGNOSIS — C159 Malignant neoplasm of esophagus, unspecified: Secondary | ICD-10-CM | POA: Diagnosis not present

## 2022-05-09 DIAGNOSIS — Z931 Gastrostomy status: Secondary | ICD-10-CM | POA: Diagnosis not present

## 2022-05-12 ENCOUNTER — Ambulatory Visit: Payer: Self-pay | Admitting: *Deleted

## 2022-05-12 NOTE — Patient Outreach (Signed)
  Care Coordination   Follow Up Visit Note   05/12/2022 Name: Trevor [name] MRN: [id number] DOB: [birth year]  Trevor [name] is a 52 y.o. year old male who sees Simpson-Tarokh, Leann, DO for primary care. I spoke with  [name] by phone today.  What matters to the patients health and wellness today?  State throat is "strong."  Attempted to discuss current living arrangements (ALF/ILF/SNF or his own home).  State he is at [facility] but also state he is in a house.  Attempted to contact brother to confirm as patient started to sound confused, no answer. Will attempt call to brother at a later date.     Goals Addressed               This Visit's Progress     Improve Nutriton/increase weight (THN) (pt-stated)   On track     Care Coordination Interventions: Admissions for falls, nausea & vomiting, poor fluid PEG intake and electrolyte imbalances noted and will follow patient ambulatory, pending discharge plans [facility] case discussion template updated Patient confirms he presently is at [facility] facility but with ED visits related to his G tube  [place] he is no longer using G-tube for feedings, eating normally      Manage the worsening symptoms of Esophageal cancer (THN) (pt-stated)   On track     Care Coordination Interventions: Patient has been hospitalized frequently at time of scheduled [facility] RN CM outreach. Will continue to successfully assist patient when in ambulatory setting Pending discharge home  Patient confirms he presently is at [facility] facility but with ED visits related to his G tube  Advised that next oncology visit is scheduled for 2/6        SDOH assessments and interventions completed:  No     Care Coordination Interventions:  Yes, provided   Follow up plan: Follow up call scheduled for 2/8    Encounter Outcome:  Pt. Visit Completed   Valente David, RN, MSN, [place] [facility] Care Management Coordinator [phone]

## 2022-05-14 ENCOUNTER — Other Ambulatory Visit: Payer: Self-pay

## 2022-05-22 ENCOUNTER — Other Ambulatory Visit: Payer: Self-pay

## 2022-05-22 ENCOUNTER — Emergency Department (HOSPITAL_COMMUNITY): Payer: 59

## 2022-05-22 ENCOUNTER — Emergency Department (HOSPITAL_COMMUNITY)
Admission: EM | Admit: 2022-05-22 | Discharge: 2022-05-23 | Discharge status: Against Medical Advice | Payer: 59 | Attending: Emergency Medicine | Admitting: Emergency Medicine

## 2022-05-22 DIAGNOSIS — U071 COVID-19: Secondary | ICD-10-CM | POA: Diagnosis not present

## 2022-05-22 DIAGNOSIS — Z8501 Personal history of malignant neoplasm of esophagus: Secondary | ICD-10-CM | POA: Diagnosis not present

## 2022-05-22 DIAGNOSIS — I10 Essential (primary) hypertension: Secondary | ICD-10-CM | POA: Insufficient documentation

## 2022-05-22 DIAGNOSIS — J168 Pneumonia due to other specified infectious organisms: Secondary | ICD-10-CM | POA: Insufficient documentation

## 2022-05-22 DIAGNOSIS — Z743 Need for continuous supervision: Secondary | ICD-10-CM | POA: Diagnosis not present

## 2022-05-22 DIAGNOSIS — Z5329 Procedure and treatment not carried out because of patient's decision for other reasons: Secondary | ICD-10-CM | POA: Insufficient documentation

## 2022-05-22 DIAGNOSIS — E11649 Type 2 diabetes mellitus with hypoglycemia without coma: Secondary | ICD-10-CM | POA: Insufficient documentation

## 2022-05-22 DIAGNOSIS — R5381 Other malaise: Secondary | ICD-10-CM | POA: Diagnosis not present

## 2022-05-22 DIAGNOSIS — R000 Tachycardia, unspecified: Secondary | ICD-10-CM | POA: Diagnosis not present

## 2022-05-22 DIAGNOSIS — Z79899 Other long term (current) drug therapy: Secondary | ICD-10-CM | POA: Diagnosis not present

## 2022-05-22 DIAGNOSIS — J189 Pneumonia, unspecified organism: Secondary | ICD-10-CM

## 2022-05-22 DIAGNOSIS — E162 Hypoglycemia, unspecified: Secondary | ICD-10-CM

## 2022-05-22 DIAGNOSIS — R197 Diarrhea, unspecified: Secondary | ICD-10-CM | POA: Diagnosis not present

## 2022-05-22 DIAGNOSIS — R059 Cough, unspecified: Secondary | ICD-10-CM | POA: Diagnosis not present

## 2022-05-22 DIAGNOSIS — J1282 Pneumonia due to coronavirus disease 2019: Secondary | ICD-10-CM | POA: Diagnosis not present

## 2022-05-22 DIAGNOSIS — E860 Dehydration: Secondary | ICD-10-CM

## 2022-05-22 DIAGNOSIS — R634 Abnormal weight loss: Secondary | ICD-10-CM | POA: Insufficient documentation

## 2022-05-22 LAB — COMPREHENSIVE METABOLIC PANEL
ALT: 9 U/L (ref 0–44)
AST: 31 U/L (ref 15–41)
Albumin: 2.5 g/dL — ABNORMAL LOW (ref 3.5–5.0)
Alkaline Phosphatase: 65 U/L (ref 38–126)
Anion gap: 16 — ABNORMAL HIGH (ref 5–15)
BUN: 31 mg/dL — ABNORMAL HIGH (ref 6–20)
CO2: 27 mmol/L (ref 22–32)
Calcium: 8.9 mg/dL (ref 8.9–10.3)
Chloride: 85 mmol/L — ABNORMAL LOW (ref 98–111)
Creatinine, Ser: 1.12 mg/dL (ref 0.61–1.24)
GFR, Estimated: >60 mL/min (ref >60)
Glucose, Bld: 50 mg/dL — ABNORMAL LOW (ref 70–99)
Potassium: 3.9 mmol/L (ref 3.5–5.1)
Sodium: 128 mmol/L — ABNORMAL LOW (ref 135–145)
Total Bilirubin: 1.1 mg/dL (ref 0.3–1.2)
Total Protein: 6.9 g/dL (ref 6.5–8.1)

## 2022-05-22 LAB — CBC WITH DIFFERENTIAL/PLATELET
Abs Immature Granulocytes: 0.04 10*3/uL (ref 0.00–0.07)
Basophils Absolute: 0.1 10*3/uL (ref 0.0–0.1)
Basophils Relative: 1 %
Eosinophils Absolute: 0 10*3/uL (ref 0.0–0.5)
Eosinophils Relative: 1 %
HCT: 37.1 % — ABNORMAL LOW (ref 39.0–52.0)
Hemoglobin: 12.2 g/dL — ABNORMAL LOW (ref 13.0–17.0)
Immature Granulocytes: 1 %
Lymphocytes Relative: 12 %
Lymphs Abs: 1 10*3/uL (ref 0.7–4.0)
MCH: 25.5 pg — ABNORMAL LOW (ref 26.0–34.0)
MCHC: 32.9 g/dL (ref 30.0–36.0)
MCV: 77.5 fL — ABNORMAL LOW (ref 80.0–100.0)
Monocytes Absolute: 0.3 10*3/uL (ref 0.1–1.0)
Monocytes Relative: 4 %
Neutro Abs: 7.3 10*3/uL (ref 1.7–7.7)
Neutrophils Relative %: 81 %
Platelets: 263 10*3/uL (ref 150–400)
RBC: 4.79 MIL/uL (ref 4.22–5.81)
RDW: 14.3 % (ref 11.5–15.5)
WBC: 8.8 10*3/uL (ref 4.0–10.5)
nRBC: 0 % (ref 0.0–0.2)

## 2022-05-22 LAB — RESP PANEL BY RT-PCR (RSV, FLU A&B, COVID)  RVPGX2
Influenza A by PCR: NEGATIVE
Influenza B by PCR: NEGATIVE
Resp Syncytial Virus by PCR: NEGATIVE
SARS Coronavirus 2 by RT PCR: POSITIVE — AB

## 2022-05-22 LAB — CBG MONITORING, ED
Glucose-Capillary: 51 mg/dL — ABNORMAL LOW (ref 70–99)
Glucose-Capillary: 139 mg/dL — ABNORMAL HIGH (ref 70–99)

## 2022-05-22 MED ORDER — DEXTROSE 50 % IV SOLN
1.0000 | Freq: Once | INTRAVENOUS | Status: AC
  Administered 2022-05-22: 50 mL via INTRAVENOUS
  Filled 2022-05-22: qty 50

## 2022-05-22 MED ORDER — HEPARIN SOD (PORK) LOCK FLUSH 100 UNIT/ML IV SOLN
INTRAVENOUS | Status: AC
  Filled 2022-05-22: qty 5

## 2022-05-22 MED ORDER — AMOXICILLIN-POT CLAVULANATE 875-125 MG PO TABS
1.0000 | ORAL_TABLET | Freq: Once | ORAL | Status: AC
  Administered 2022-05-22: 1 via ORAL
  Filled 2022-05-22: qty 1

## 2022-05-22 MED ORDER — AZITHROMYCIN 250 MG PO TABS: 250.0000 mg | ORAL_TABLET | Freq: Every day | ORAL | 0 refills | Status: DC

## 2022-05-22 MED ORDER — SODIUM CHLORIDE 0.9 % IV BOLUS
500.0000 mL | Freq: Once | INTRAVENOUS | Status: AC
  Administered 2022-05-22: 500 mL via INTRAVENOUS

## 2022-05-22 MED ORDER — AMOXICILLIN-POT CLAVULANATE 875-125 MG PO TABS: 1.0000 | ORAL_TABLET | Freq: Two times a day (BID) | ORAL | 0 refills | Status: DC

## 2022-05-22 NOTE — ED Notes (Signed)
Pt. Provided with AVS. Results and medications reviewed with him.

## 2022-05-22 NOTE — ED Provider Notes (Signed)
[facility] Provider Note   CSN: [id number] Arrival date & time: 05/22/22  1343     History  Chief Complaint  Patient presents with   Failure To Thrive    Trevor [name] is a 52 y.o. male.  HPI Patient with history of esophageal cancer.  Reportedly sent in due to weight loss.  States for the last month has not been doing well but worse over the last few days.  States he both eats orally and gets Ensure through his PEG tube.  States that he has been having diarrhea however with what puts him.  States he has been feeling weak thinks the nursing home called cancer doctor and was told to come in.   Past Medical History:  Diagnosis Date   Diabetes mellitus without complication ([place])    Esophageal cancer ([place])    Hypertension     Home Medications Prior to Admission medications   Medication Sig Start Date End Date Taking? Authorizing Provider  amoxicillin-clavulanate (AUGMENTIN) 875-125 MG tablet Take 1 tablet by mouth every 12 (twelve) hours. 05/22/22  Yes Davonna Belling, MD  azithromycin (ZITHROMAX) 250 MG tablet Take 1 tablet (250 mg total) by mouth daily. Take first 2 tablets together, then 1 every day until finished. 05/22/22  Yes Davonna Belling, MD  allopurinol (ZYLOPRIM) 300 MG tablet Take 1 tablet (300 mg total) by mouth daily. 08/04/21   Sanjuana Kava, MD  ferrous sulfate 325 (65 FE) MG EC tablet Take 1 tablet (325 mg total) by mouth daily with breakfast. 03/23/22   Roxan Hockey, MD  fluorouracil CALGB 86767 2,400 mg/m2 in sodium chloride 0.9 % 150 mL Inject 2,400 mg/m2 into the vein over 48 hr. Every 2 weeks    [provider]  FLUOROURACIL IV Inject into the vein every 14 (fourteen) days.    [provider]  folic acid (FOLVITE) 1 MG tablet Place 1 tablet (1 mg total) into feeding tube daily. 03/13/22   Orson Eva, MD  gabapentin (NEURONTIN) 250 MG/5ML solution Place 6 mLs (300 mg total) into feeding tube at  bedtime. 02/22/22   Pokhrel, Corrie Mckusick, MD  LEUCOVORIN CALCIUM IV Inject into the vein every 14 (fourteen) days.    [provider]  megestrol (MEGACE) 400 MG/10ML suspension Take 10 mLs (400 mg total) by mouth 2 (two) times daily. 12/07/21   Derek Jack, MD  metoprolol tartrate (LOPRESSOR) 25 MG tablet Take 0.5 tablets (12.5 mg total) by mouth 2 (two) times daily. 03/23/22   Roxan Hockey, MD  midodrine (PROAMATINE) 5 MG tablet Place 1 tablet (5 mg total) into feeding tube 3 (three) times daily with meals. 03/12/22   Orson Eva, MD  Multiple Vitamins-Minerals (MULTIVITAMIN WITH MINERALS) tablet Take 1 tablet by mouth daily. 03/23/22 03/23/23  Roxan Hockey, MD  nicotine (NICODERM CQ - DOSED IN MG/24 HOURS) 14 mg/24hr patch Place 1 patch (14 mg total) onto the skin daily as needed (nicotine craving). 03/12/22   Orson Eva, MD  Nutritional Supplements (FEEDING SUPPLEMENT, VITAL 1.5 CAL,) LIQD Place 1,000 mLs into feeding tube continuous. Vital 1.5@ 55 ml/hr per PEG tube - 03/23/22   Emokpae, Courage, MD  OXALIPLATIN IV Inject into the vein every 14 (fourteen) days.    [provider]  oxyCODONE (OXY IR/ROXICODONE) 5 MG immediate release tablet Place 1 tablet (5 mg total) into feeding tube every 8 (eight) hours as needed for moderate pain. 03/23/22   Roxan Hockey, MD  pantoprazole (PROTONIX) 40 MG tablet  Take 1 tablet (40 mg total) by mouth daily. 03/24/22   Roxan Hockey, MD  potassium chloride SA (KLOR-CON M20) 20 MEQ tablet Take 1 tablet (20 mEq total) by mouth daily. 03/27/22   Roxan Hockey, MD  prochlorperazine (COMPAZINE) 10 MG tablet Take 1 tablet (10 mg total) by mouth every 6 (six) hours as needed for nausea or vomiting. 10/27/21   Derek Jack, MD  Water For Irrigation, Sterile (FREE WATER) SOLN Place 200 mLs into feeding tube every 6 (six) hours. 03/23/22   Roxan Hockey, MD      Allergies    Pork-derived products    Review of Systems    Review of Systems  Physical Exam Updated Vital Signs BP (!) 89/75 (BP Location: Right Arm)   Pulse (!) 118   Temp 98.2 F (36.8 C) (Oral)   Resp 18   Ht '5\' 6"'$  (1.676 m)   Wt 40.8 kg   SpO2 96%   BMI 14.52 kg/m  Physical Exam Constitutional:      Comments: Cachectic  Cardiovascular:     Rate and Rhythm: Regular rhythm.  Abdominal:     Tenderness: There is no abdominal tenderness.  Musculoskeletal:     Cervical back: Neck supple.  Skin:    Capillary Refill: Capillary refill takes less than 2 seconds.  Neurological:     Mental Status: He is alert and oriented to person, place, and time.     ED Results / Procedures / Treatments   Labs (all labs ordered are listed, but only abnormal results are displayed) Labs Reviewed  RESP PANEL BY RT-PCR (RSV, FLU A&B, COVID)  RVPGX2 - Abnormal; Notable for the following components:      Result Value   SARS Coronavirus 2 by RT PCR POSITIVE (*)    All other components within normal limits  CBC WITH DIFFERENTIAL/PLATELET - Abnormal; Notable for the following components:   Hemoglobin 12.2 (*)    HCT 37.1 (*)    MCV 77.5 (*)    MCH 25.5 (*)    All other components within normal limits  COMPREHENSIVE METABOLIC PANEL - Abnormal; Notable for the following components:   Sodium 128 (*)    Chloride 85 (*)    Glucose, Bld 50 (*)    BUN 31 (*)    Albumin 2.5 (*)    Anion gap 16 (*)    All other components within normal limits  CBG MONITORING, ED - Abnormal; Notable for the following components:   Glucose-Capillary 51 (*)    All other components within normal limits  CBG MONITORING, ED - Abnormal; Notable for the following components:   Glucose-Capillary 139 (*)    All other components within normal limits  CBC WITH DIFFERENTIAL/PLATELET    EKG None  Radiology DG Chest 1 View  Result Date: 05/22/2022 CLINICAL DATA:  Cough. EXAM: CHEST  1 VIEW COMPARISON:  March 02, 2022 FINDINGS: The heart size and mediastinal contours are  stable. Right central venous line is unchanged. Patchy consolidation identified throughout the right lung. The left lung is clear. The visualized skeletal structures are stable. IMPRESSION: Pneumonia throughout the right lung. Electronically Signed   By: Abelardo Diesel M.D.   On: 05/22/2022 16:53    Procedures Procedures    Medications Ordered in ED Medications  sodium chloride 0.9 % bolus 500 mL (500 mLs Intravenous Bolus 05/22/22 1832)  dextrose 50 % solution 50 mL (50 mLs Intravenous Given 05/22/22 1925)  amoxicillin-clavulanate (AUGMENTIN) 875-125 MG per tablet 1 tablet (1  tablet Oral Given 05/22/22 2044)  heparin lock flush 100 UNIT/ML injection (  Given 05/22/22 2108)    ED Course/ Medical Decision Making/ A&P                             Medical Decision Making Amount and/or Complexity of Data Reviewed Labs: ordered. Radiology: ordered.  Risk Prescription drug management.   Patient reported brought in for generalized weakness.  Decreased oral intake despite PEG tube.  Is somewhat hypotensive.  States he has had a little bit of a cough.  States sputum production is may be more difficult than baseline but states he already trouble with sputum normally.  States he is feeling more weak.  Reportedly sent in for further evaluation.  I reviewed oncology notes.  Will get basic blood work and give fluid bolus.  Dehydration infection considered. X-ray shows pneumonia.  Has had some hypotension.  Reported history of same.  Has been on midodrine.  Did have hypoglycemia also.  However patient does not want admission to the hospital.  Refusing.  Will treat with oral antibiotics.  Appears to have capacity and will leave AMA.  Has been eating however and hopefully this should help keep the sugar up.  I think he is rather high risk of worsening but has the ability to leave.       Final Clinical Impression(s) / ED Diagnoses Final diagnoses:  Pneumonia of right lung due to infectious organism,  unspecified part of lung  Dehydration  Hypoglycemia    Rx / DC Orders ED Discharge Orders          Ordered    amoxicillin-clavulanate (AUGMENTIN) 875-125 MG tablet  Every 12 hours        05/22/22 2008    azithromycin (ZITHROMAX) 250 MG tablet  Daily        05/22/22 2008              Davonna Belling, MD 05/22/22 2314

## 2022-05-22 NOTE — ED Notes (Signed)
Notified [place] [facility] of patient needing transportation back to [facility]. 

## 2022-05-22 NOTE — ED Notes (Signed)
Again tried to call report to [facility]. Unable to reach any staff by telephone. Awaiting transport to facility.

## 2022-05-22 NOTE — ED Notes (Signed)
Pt back from x-ray.

## 2022-05-22 NOTE — ED Triage Notes (Signed)
Pt arrived via [facility] from [facility] due to pt's cancer doctor at [facility] concerned about pts rapid weight loss has feeding tube and Hx of throat cancer. Pt does not have any complaints at this moment and states "I didn't even know I was coming up her today"

## 2022-05-22 NOTE — Discharge Instructions (Addendum)
You are leaving against our advice.  Try and keep yourself hydrated.  The antibiotics hopefully will help with the pneumonia.

## 2022-05-22 NOTE — ED Notes (Signed)
Attempted to call report to SNF. No answer at any extension available.

## 2022-05-25 DIAGNOSIS — C155 Malignant neoplasm of lower third of esophagus: Secondary | ICD-10-CM | POA: Diagnosis not present

## 2022-05-25 DIAGNOSIS — Z419 Encounter for procedure for purposes other than remedying health state, unspecified: Secondary | ICD-10-CM | POA: Diagnosis not present

## 2022-05-28 ENCOUNTER — Emergency Department (HOSPITAL_COMMUNITY): Payer: Medicaid Other

## 2022-05-28 ENCOUNTER — Inpatient Hospital Stay (HOSPITAL_COMMUNITY)
Admission: EM | Admit: 2022-05-28 | Discharge: 2022-06-05 | DRG: 871 | Disposition: A | Discharge status: Skilled Nursing Facility | Payer: Medicaid Other | Source: Skilled Nursing Facility | Attending: Internal Medicine | Admitting: Internal Medicine

## 2022-05-28 ENCOUNTER — Other Ambulatory Visit: Payer: Self-pay

## 2022-05-28 ENCOUNTER — Encounter (HOSPITAL_COMMUNITY): Payer: Self-pay | Admitting: Emergency Medicine

## 2022-05-28 DIAGNOSIS — K219 Gastro-esophageal reflux disease without esophagitis: Secondary | ICD-10-CM | POA: Diagnosis present

## 2022-05-28 DIAGNOSIS — J1282 Pneumonia due to coronavirus disease 2019: Secondary | ICD-10-CM | POA: Diagnosis present

## 2022-05-28 DIAGNOSIS — R627 Adult failure to thrive: Secondary | ICD-10-CM

## 2022-05-28 DIAGNOSIS — R2681 Unsteadiness on feet: Secondary | ICD-10-CM | POA: Diagnosis present

## 2022-05-28 DIAGNOSIS — E222 Syndrome of inappropriate secretion of antidiuretic hormone: Secondary | ICD-10-CM | POA: Diagnosis present

## 2022-05-28 DIAGNOSIS — Z681 Body mass index (BMI) 19 or less, adult: Secondary | ICD-10-CM | POA: Diagnosis not present

## 2022-05-28 DIAGNOSIS — E43 Unspecified severe protein-calorie malnutrition: Secondary | ICD-10-CM

## 2022-05-28 DIAGNOSIS — E876 Hypokalemia: Secondary | ICD-10-CM | POA: Diagnosis not present

## 2022-05-28 DIAGNOSIS — E559 Vitamin D deficiency, unspecified: Secondary | ICD-10-CM | POA: Diagnosis present

## 2022-05-28 DIAGNOSIS — J850 Gangrene and necrosis of lung: Secondary | ICD-10-CM | POA: Diagnosis present

## 2022-05-28 DIAGNOSIS — U071 COVID-19: Secondary | ICD-10-CM | POA: Diagnosis present

## 2022-05-28 DIAGNOSIS — I9589 Other hypotension: Secondary | ICD-10-CM | POA: Diagnosis present

## 2022-05-28 DIAGNOSIS — R296 Repeated falls: Secondary | ICD-10-CM | POA: Diagnosis not present

## 2022-05-28 DIAGNOSIS — J984 Other disorders of lung: Secondary | ICD-10-CM | POA: Diagnosis not present

## 2022-05-28 DIAGNOSIS — J851 Abscess of lung with pneumonia: Secondary | ICD-10-CM | POA: Diagnosis present

## 2022-05-28 DIAGNOSIS — Z931 Gastrostomy status: Secondary | ICD-10-CM

## 2022-05-28 DIAGNOSIS — F1010 Alcohol abuse, uncomplicated: Secondary | ICD-10-CM | POA: Diagnosis present

## 2022-05-28 DIAGNOSIS — C159 Malignant neoplasm of esophagus, unspecified: Secondary | ICD-10-CM | POA: Diagnosis not present

## 2022-05-28 DIAGNOSIS — I10 Essential (primary) hypertension: Secondary | ICD-10-CM | POA: Diagnosis present

## 2022-05-28 DIAGNOSIS — L8990 Pressure ulcer of unspecified site, unspecified stage: Secondary | ICD-10-CM | POA: Insufficient documentation

## 2022-05-28 DIAGNOSIS — R1310 Dysphagia, unspecified: Secondary | ICD-10-CM | POA: Diagnosis present

## 2022-05-28 DIAGNOSIS — M109 Gout, unspecified: Secondary | ICD-10-CM | POA: Diagnosis present

## 2022-05-28 DIAGNOSIS — J9611 Chronic respiratory failure with hypoxia: Secondary | ICD-10-CM | POA: Diagnosis not present

## 2022-05-28 DIAGNOSIS — E872 Acidosis, unspecified: Secondary | ICD-10-CM | POA: Diagnosis present

## 2022-05-28 DIAGNOSIS — R197 Diarrhea, unspecified: Secondary | ICD-10-CM | POA: Diagnosis present

## 2022-05-28 DIAGNOSIS — K9423 Gastrostomy malfunction: Secondary | ICD-10-CM | POA: Diagnosis present

## 2022-05-28 DIAGNOSIS — Z79899 Other long term (current) drug therapy: Secondary | ICD-10-CM

## 2022-05-28 DIAGNOSIS — Z923 Personal history of irradiation: Secondary | ICD-10-CM

## 2022-05-28 DIAGNOSIS — F17210 Nicotine dependence, cigarettes, uncomplicated: Secondary | ICD-10-CM | POA: Diagnosis present

## 2022-05-28 DIAGNOSIS — J9621 Acute and chronic respiratory failure with hypoxia: Secondary | ICD-10-CM

## 2022-05-28 DIAGNOSIS — E871 Hypo-osmolality and hyponatremia: Secondary | ICD-10-CM | POA: Diagnosis present

## 2022-05-28 DIAGNOSIS — Z720 Tobacco use: Secondary | ICD-10-CM

## 2022-05-28 DIAGNOSIS — E1142 Type 2 diabetes mellitus with diabetic polyneuropathy: Secondary | ICD-10-CM | POA: Diagnosis present

## 2022-05-28 DIAGNOSIS — Z7189 Other specified counseling: Secondary | ICD-10-CM | POA: Diagnosis not present

## 2022-05-28 DIAGNOSIS — J189 Pneumonia, unspecified organism: Principal | ICD-10-CM

## 2022-05-28 DIAGNOSIS — Z9221 Personal history of antineoplastic chemotherapy: Secondary | ICD-10-CM

## 2022-05-28 DIAGNOSIS — J9811 Atelectasis: Secondary | ICD-10-CM | POA: Diagnosis not present

## 2022-05-28 DIAGNOSIS — J690 Pneumonitis due to inhalation of food and vomit: Secondary | ICD-10-CM | POA: Diagnosis not present

## 2022-05-28 DIAGNOSIS — Z800 Family history of malignant neoplasm of digestive organs: Secondary | ICD-10-CM

## 2022-05-28 DIAGNOSIS — A419 Sepsis, unspecified organism: Principal | ICD-10-CM | POA: Diagnosis present

## 2022-05-28 DIAGNOSIS — Z515 Encounter for palliative care: Secondary | ICD-10-CM | POA: Diagnosis not present

## 2022-05-28 DIAGNOSIS — Z91014 Allergy to mammalian meats: Secondary | ICD-10-CM

## 2022-05-28 DIAGNOSIS — Z8042 Family history of malignant neoplasm of prostate: Secondary | ICD-10-CM

## 2022-05-28 LAB — CBC WITH DIFFERENTIAL/PLATELET
Abs Immature Granulocytes: 0.04 10*3/uL (ref 0.00–0.07)
Basophils Absolute: 0 10*3/uL (ref 0.0–0.1)
Basophils Relative: 1 %
Eosinophils Absolute: 0 10*3/uL (ref 0.0–0.5)
Eosinophils Relative: 0 %
HCT: 33.6 % — ABNORMAL LOW (ref 39.0–52.0)
Hemoglobin: 11 g/dL — ABNORMAL LOW (ref 13.0–17.0)
Immature Granulocytes: 1 %
Lymphocytes Relative: 31 %
Lymphs Abs: 1.5 10*3/uL (ref 0.7–4.0)
MCH: 25.3 pg — ABNORMAL LOW (ref 26.0–34.0)
MCHC: 32.7 g/dL (ref 30.0–36.0)
MCV: 77.2 fL — ABNORMAL LOW (ref 80.0–100.0)
Monocytes Absolute: 0.3 10*3/uL (ref 0.1–1.0)
Monocytes Relative: 7 %
Neutro Abs: 2.9 10*3/uL (ref 1.7–7.7)
Neutrophils Relative %: 60 %
Platelets: 390 10*3/uL (ref 150–400)
RBC: 4.35 MIL/uL (ref 4.22–5.81)
RDW: 14.6 % (ref 11.5–15.5)
WBC: 4.7 10*3/uL (ref 4.0–10.5)
nRBC: 0 % (ref 0.0–0.2)

## 2022-05-28 LAB — MAGNESIUM: Magnesium: 2 mg/dL (ref 1.7–2.4)

## 2022-05-28 LAB — COMPREHENSIVE METABOLIC PANEL
ALT: 11 U/L (ref 0–44)
AST: 23 U/L (ref 15–41)
Albumin: 2.2 g/dL — ABNORMAL LOW (ref 3.5–5.0)
Alkaline Phosphatase: 91 U/L (ref 38–126)
Anion gap: 12 (ref 5–15)
BUN: 29 mg/dL — ABNORMAL HIGH (ref 6–20)
CO2: 30 mmol/L (ref 22–32)
Calcium: 9 mg/dL (ref 8.9–10.3)
Chloride: 89 mmol/L — ABNORMAL LOW (ref 98–111)
Creatinine, Ser: 0.79 mg/dL (ref 0.61–1.24)
GFR, Estimated: >60 mL/min (ref >60)
Glucose, Bld: 72 mg/dL (ref 70–99)
Potassium: 3.6 mmol/L (ref 3.5–5.1)
Sodium: 131 mmol/L — ABNORMAL LOW (ref 135–145)
Total Bilirubin: 0.8 mg/dL (ref 0.3–1.2)
Total Protein: 6.5 g/dL (ref 6.5–8.1)

## 2022-05-28 LAB — LACTIC ACID, PLASMA
Lactic Acid, Venous: 2.5 mmol/L (ref 0.5–1.9)
Lactic Acid, Venous: 2.5 mmol/L (ref 0.5–1.9)
Lactic Acid, Venous: 2 mmol/L (ref 0.5–1.9)

## 2022-05-28 LAB — BLOOD GAS, VENOUS
Acid-Base Excess: 10.9 mmol/L — ABNORMAL HIGH (ref 0.0–2.0)
Bicarbonate: 36.4 mmol/L — ABNORMAL HIGH (ref 20.0–28.0)
Drawn by: 65579
O2 Saturation: 55.3 %
Patient temperature: 36.5
pCO2, Ven: 49 mmHg (ref 44–60)
pH, Ven: 7.48 — ABNORMAL HIGH (ref 7.25–7.43)
pO2, Ven: <31 mmHg — CL (ref 32–45)

## 2022-05-28 LAB — GLUCOSE, CAPILLARY
Glucose-Capillary: 108 mg/dL — ABNORMAL HIGH (ref 70–99)
Glucose-Capillary: 93 mg/dL (ref 70–99)

## 2022-05-28 MED ORDER — FREE WATER: 30.0000 mL | Status: DC | PRN

## 2022-05-28 MED ORDER — SODIUM CHLORIDE 0.9 % IV SOLN
1.0000 g | Freq: Once | INTRAVENOUS | Status: AC
  Administered 2022-05-28: 1 g via INTRAVENOUS
  Filled 2022-05-28: qty 10

## 2022-05-28 MED ORDER — RIVAROXABAN 10 MG PO TABS
10.0000 mg | ORAL_TABLET | Freq: Every day | ORAL | Status: DC
  Administered 2022-05-28 – 2022-06-04 (×8): 10 mg via ORAL
  Filled 2022-05-28 (×9): qty 1

## 2022-05-28 MED ORDER — ONDANSETRON HCL 4 MG/2ML IJ SOLN: 4.0000 mg | Freq: Four times a day (QID) | INTRAMUSCULAR | Status: DC | PRN

## 2022-05-28 MED ORDER — LACTATED RINGERS IV SOLN: INTRAVENOUS | Status: DC

## 2022-05-28 MED ORDER — MIDODRINE HCL 5 MG PO TABS
5.0000 mg | ORAL_TABLET | Freq: Three times a day (TID) | ORAL | Status: DC
  Administered 2022-05-28 – 2022-06-05 (×23): 5 mg
  Filled 2022-05-28 (×25): qty 1

## 2022-05-28 MED ORDER — DEXTROMETHORPHAN POLISTIREX ER 30 MG/5ML PO SUER
30.0000 mg | Freq: Two times a day (BID) | ORAL | Status: AC
  Administered 2022-05-28 – 2022-05-29 (×4): 30 mg via ORAL
  Filled 2022-05-28 (×7): qty 5

## 2022-05-28 MED ORDER — SODIUM CHLORIDE 0.9 % IV SOLN
500.0000 mg | Freq: Once | INTRAVENOUS | Status: AC
  Administered 2022-05-28: 500 mg via INTRAVENOUS
  Filled 2022-05-28: qty 5

## 2022-05-28 MED ORDER — CHLORHEXIDINE GLUCONATE CLOTH 2 % EX PADS
6.0000 | MEDICATED_PAD | Freq: Every day | CUTANEOUS | Status: DC
  Administered 2022-05-29 – 2022-06-05 (×5): 6 via TOPICAL

## 2022-05-28 MED ORDER — VANCOMYCIN HCL IN DEXTROSE 1-5 GM/200ML-% IV SOLN: 1000.0000 mg | Freq: Once | INTRAVENOUS | Status: DC

## 2022-05-28 MED ORDER — OMEPRAZOLE 2 MG/ML ORAL SUSPENSION
20.0000 mg | Freq: Every day | ORAL | Status: DC
  Filled 2022-05-28 (×4): qty 10

## 2022-05-28 MED ORDER — VANCOMYCIN HCL 500 MG/100ML IV SOLN
500.0000 mg | Freq: Two times a day (BID) | INTRAVENOUS | Status: DC
  Filled 2022-05-28 (×2): qty 100

## 2022-05-28 MED ORDER — FREE WATER: 30.0000 mL | Status: DC

## 2022-05-28 MED ORDER — SODIUM CHLORIDE 0.9 % IV SOLN
1.5000 g | Freq: Four times a day (QID) | INTRAVENOUS | Status: DC
  Administered 2022-05-28 – 2022-06-05 (×31): 1.5 g via INTRAVENOUS
  Filled 2022-05-28: qty 4
  Filled 2022-05-28: qty 1.5
  Filled 2022-05-28 (×23): qty 4
  Filled 2022-05-28: qty 1.5
  Filled 2022-05-28 (×3): qty 4
  Filled 2022-05-28: qty 1.5
  Filled 2022-05-28 (×9): qty 4

## 2022-05-28 MED ORDER — FENTANYL CITRATE PF 50 MCG/ML IJ SOSY
12.5000 ug | PREFILLED_SYRINGE | INTRAMUSCULAR | Status: DC | PRN
  Administered 2022-05-29: 12.5 ug via INTRAVENOUS
  Filled 2022-05-28: qty 1

## 2022-05-28 MED ORDER — ALBUTEROL SULFATE HFA 108 (90 BASE) MCG/ACT IN AERS
2.0000 | INHALATION_SPRAY | RESPIRATORY_TRACT | Status: DC | PRN
  Administered 2022-05-28: 2 via RESPIRATORY_TRACT
  Filled 2022-05-28: qty 6.7

## 2022-05-28 MED ORDER — ONDANSETRON HCL 4 MG PO TABS: 4.0000 mg | ORAL_TABLET | Freq: Four times a day (QID) | ORAL | Status: DC | PRN

## 2022-05-28 MED ORDER — SODIUM CHLORIDE 0.9 % IV SOLN: INTRAVENOUS | Status: DC

## 2022-05-28 MED ORDER — OXYCODONE HCL 5 MG PO TABS: 5.0000 mg | ORAL_TABLET | Freq: Four times a day (QID) | ORAL | Status: DC | PRN

## 2022-05-28 MED ORDER — FENTANYL CITRATE PF 50 MCG/ML IJ SOSY
50.0000 ug | PREFILLED_SYRINGE | Freq: Once | INTRAMUSCULAR | Status: AC
  Administered 2022-05-28: 50 ug via INTRAVENOUS
  Filled 2022-05-28: qty 1

## 2022-05-28 MED ORDER — MEGESTROL ACETATE 400 MG/10ML PO SUSP
400.0000 mg | Freq: Two times a day (BID) | ORAL | Status: DC
  Administered 2022-05-28 – 2022-06-05 (×15): 400 mg
  Filled 2022-05-28 (×15): qty 10

## 2022-05-28 MED ORDER — JEVITY 1.2 CAL PO LIQD
1000.0000 mL | ORAL | Status: DC
  Administered 2022-05-31: 1000 mL

## 2022-05-28 MED ORDER — ISOSOURCE 1.5 CAL PO LIQD: 1000.0000 mL | ORAL | Status: DC

## 2022-05-28 MED ORDER — LACTATED RINGERS IV BOLUS
1000.0000 mL | Freq: Once | INTRAVENOUS | Status: AC
  Administered 2022-05-28: 1000 mL via INTRAVENOUS

## 2022-05-28 MED ORDER — ORAL CARE MOUTH RINSE
15.0000 mL | OROMUCOSAL | Status: DC
  Administered 2022-05-29 – 2022-06-05 (×15): 15 mL via OROMUCOSAL

## 2022-05-28 MED ORDER — ACETAMINOPHEN 650 MG RE SUPP: 650.0000 mg | Freq: Four times a day (QID) | RECTAL | Status: DC | PRN

## 2022-05-28 MED ORDER — IPRATROPIUM-ALBUTEROL 0.5-2.5 (3) MG/3ML IN SOLN
3.0000 mL | Freq: Three times a day (TID) | RESPIRATORY_TRACT | Status: DC
  Administered 2022-05-28 – 2022-05-31 (×8): 3 mL via RESPIRATORY_TRACT
  Filled 2022-05-28 (×7): qty 3

## 2022-05-28 MED ORDER — FOLIC ACID 1 MG PO TABS
1.0000 mg | ORAL_TABLET | Freq: Every day | ORAL | Status: DC
  Administered 2022-05-28 – 2022-06-05 (×9): 1 mg
  Filled 2022-05-28 (×9): qty 1

## 2022-05-28 MED ORDER — IPRATROPIUM-ALBUTEROL 0.5-2.5 (3) MG/3ML IN SOLN
3.0000 mL | Freq: Three times a day (TID) | RESPIRATORY_TRACT | Status: DC
  Administered 2022-05-28: 3 mL via RESPIRATORY_TRACT
  Filled 2022-05-28 (×2): qty 3

## 2022-05-28 MED ORDER — IOHEXOL 350 MG/ML SOLN
100.0000 mL | Freq: Once | INTRAVENOUS | Status: AC | PRN
  Administered 2022-05-28: 100 mL via INTRAVENOUS

## 2022-05-28 MED ORDER — ORAL CARE MOUTH RINSE: 15.0000 mL | OROMUCOSAL | Status: DC | PRN

## 2022-05-28 MED ORDER — LACTATED RINGERS IV BOLUS
500.0000 mL | Freq: Once | INTRAVENOUS | Status: AC
  Administered 2022-05-28: 500 mL via INTRAVENOUS

## 2022-05-28 MED ORDER — MAGNESIUM OXIDE -MG SUPPLEMENT 400 (240 MG) MG PO TABS
400.0000 mg | ORAL_TABLET | Freq: Two times a day (BID) | ORAL | Status: DC
  Administered 2022-05-28 – 2022-06-05 (×15): 400 mg
  Filled 2022-05-28 (×15): qty 1

## 2022-05-28 MED ORDER — ALLOPURINOL 100 MG PO TABS
300.0000 mg | ORAL_TABLET | Freq: Every day | ORAL | Status: DC
  Administered 2022-05-28 – 2022-06-05 (×9): 300 mg
  Filled 2022-05-28 (×9): qty 3

## 2022-05-28 MED ORDER — FREE WATER
200.0000 mL | Freq: Three times a day (TID) | Status: DC
  Administered 2022-05-28 – 2022-06-05 (×18): 200 mL

## 2022-05-28 MED ORDER — ACETAMINOPHEN 325 MG PO TABS: 650.0000 mg | ORAL_TABLET | Freq: Four times a day (QID) | ORAL | Status: DC | PRN

## 2022-05-28 MED ORDER — ADULT MULTIVITAMIN W/MINERALS CH
1.0000 | ORAL_TABLET | Freq: Every day | ORAL | Status: DC
  Administered 2022-05-28 – 2022-06-05 (×9): 1
  Filled 2022-05-28 (×9): qty 1

## 2022-05-28 MED ORDER — OSMOLITE 1.2 CAL PO LIQD
1000.0000 mL | ORAL | Status: DC
  Filled 2022-05-28: qty 1000

## 2022-05-28 MED ORDER — GABAPENTIN 250 MG/5ML PO SOLN
300.0000 mg | Freq: Every day | ORAL | Status: DC
  Administered 2022-05-28 – 2022-06-04 (×7): 300 mg
  Filled 2022-05-28 (×10): qty 6

## 2022-05-28 NOTE — Progress Notes (Signed)
Pt request regular diet and regular foods. Pt is getting a dysthagia two diet. Md made aware.

## 2022-05-28 NOTE — Progress Notes (Addendum)
Brief Nutrition Note RD working remotely.   Consult received for enteral/tube feeding initiation and management.  Adult Enteral Nutrition Protocol initiated. Full assessment to follow.  PEG/G-tube tube in place. Order in place for Jevity 1.2 @ 55 ml/hr.  Admitting Dx: Aspiration pneumonia ([place]) [J69.0] Squamous cell esophageal cancer ([place]) [C15.9] Acute on chronic respiratory failure with hypoxia ([facility]) [J96.21] Multifocal pneumonia [J18.9]  Body mass index is 14.2 kg/m. Pt meets criteria for underweight based on current BMI.  Labs:  Recent Labs  Lab 05/22/22 1612 05/28/22 0842  NA 128* 131*  K 3.9 3.6  CL 85* 89*  CO2 27 30  BUN 31* 29*  CREATININE 1.12 0.79  CALCIUM 8.9 9.0  MG  --  2.0  GLUCOSE 50* 72       Trevor Matin, MS, RD, LDN, CNSC Clinical Dietitian PRN/Relief staff On-call/weekend pager # available in [facility]

## 2022-05-28 NOTE — Hospital Course (Addendum)
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  He is a resident of [facility] long-term care.  He was seen in the emergency department 1 week PTA and diagnosed with a right-sided pneumonia but refused admission at that time.  He was discharged [place] on Augmentin which reportedly was started a couple of days ago at [facility].    On the day of admission, he complained of shortness of breath and EMS was called found his SpO2 to be 76% on 3 L.  He was placed on a nonrebreather with improvement.  His ED workup reveals a worsening right-sided pneumonia with concern for abscess lung.  The CT also reveals some right medial lobe gas and fluid concerning for possible necrosis.  Patient was stabilized in the ED and admission was requested for further management.  Patient continues to insist that he be full code and wants to continue cancer treatment.  He is due for radiation treatment on 05/30/2022 and wants to be discharged prior to that time. He received 6 days of IV Unasyn.  He remained stable clinically, afebrile, hemodynamically stable.  He was weaned off oxygen.  During the hospitalization, pt continued his usual behavioral habits on intermittently refusing care, medications, and enteral feedings.

## 2022-05-28 NOTE — Progress Notes (Signed)
Pharmacy Antibiotic Note  Trevor [name] is a 52 y.o. male admitted on 05/28/2022 with pneumonia.  Pharmacy has been consulted for vancomycin dosing.  Plan: Vancomycin 1000 mg IV loading dose, then '500mg'$   Q 12 hrs. Goal AUC 400-550. Expected AUC: 425 SCr used: 0.8  F/u cx and clinical progress Monitor V/S, labs and levels as indicated  Height: '5\' 6"'$  (167.6 cm) Weight: 39.9 kg (88 lb) IBW/kg (Calculated) : 63.8  Temp (24hrs), Avg:97.6 F (36.4 C), Min:97.6 F (36.4 C), Max:97.6 F (36.4 C)  Recent Labs  Lab 05/22/22 1612 05/28/22 0842 05/28/22 1036  WBC 8.8 4.7  --   CREATININE 1.12 0.79  --   LATICACIDVEN  --   --  2.5*    Estimated Creatinine Clearance: 59.6 mL/min (by C-G formula based on SCr of 0.79 mg/dL).    Allergies  Allergen Reactions   Pork-Derived Products Other (See Comments)    Patient does not eat pork due to his religious belief    Antimicrobials this admission: Vancomycin 2/4 >>  azithromycin 2/4 in ED Ceftriaxone 2/4 in ED  Microbiology results: 2/4 BCx: pending 2/4 UCx: pending   MRSA PCR:   Thank you for allowing pharmacy to be a part of this patient's care.   Isac Sarna, BS Pharm D, BCPS Clinical Pharmacist 05/28/2022 1:13 PM

## 2022-05-28 NOTE — H&P (Addendum)
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  He is a resident of [facility] long-term care.  He was seen in the emergency department 1 week ago and diagnosed with a right-sided pneumonia but refused admission at that time.  He was discharged [place] on Augmentin which reportedly was started a couple of days ago at [facility].    Today he complained of shortness of breath and EMS was called found his SpO2 to be 76% on 3 L.  He was placed on a nonrebreather with improvement.  His ED workup reveals a worsening right-sided pneumonia with concern for abscess lung.  The CT also reveals some right medial lobe gas and fluid concerning for possible necrosis.  Patient was stabilized in the ED and admission was requested for further management.  Patient continues to insist that he be full code and wants to continue cancer treatment.  He is due for radiation treatment on 05/30/2022 and wants to be discharged prior to that time.    Past Medical History:  Diagnosis Date   Diabetes mellitus without complication ([place])    Esophageal cancer ([place])    Hypertension     Past Surgical History:  Procedure Laterality Date   BIOPSY  08/26/2021   Procedure: BIOPSY;  Surgeon: Eloise Harman, DO;  Location: [facility] END[facility];  Service:  Endoscopy;;   BRONCHIAL BIOPSY  01/23/2022   Procedure: BRONCHIAL BIOPSIES;  Surgeon: Collene Gobble, MD;  Location: [facility] ENDOSCOPY;  Service: Pulmonary;;   BRONCHIAL BRUSHINGS  01/23/2022   Procedure: BRONCHIAL BRUSHINGS;  Surgeon: Collene Gobble, MD;  Location: [facility] ENDOSCOPY;  Service: Pulmonary;;   BRONCHIAL NEEDLE ASPIRATION BIOPSY  01/23/2022   Procedure: BRONCHIAL NEEDLE ASPIRATION BIOPSIES;  Surgeon: Collene Gobble, MD;  Location: [facility] ENDOSCOPY;  Service: Pulmonary;;   BRONCHIAL WASHINGS  01/23/2022   Procedure: BRONCHIAL WASHINGS;  Surgeon: Collene Gobble, MD;  Location: [place] ENDOSCOPY;  Service: Pulmonary;;   ESOPHAGOGASTRODUODENOSCOPY (EGD) WITH PROPOFOL N/A 08/26/2021   Procedure: ESOPHAGOGASTRODUODENOSCOPY (EGD) WITH PROPOFOL;  Surgeon: Eloise Harman, DO;  [facility]: [facility];  Service: Endoscopy;  Laterality: N/A;  2:00pm   GASTROSTOMY N/A 10/12/2021   Procedure: OPEN GASTROSTOMY TUBE;  Surgeon: Aviva Signs, MD;  Location: [facility] ORS;  Service: General;  Laterality: N/A;   IR [place] GASTRO/COLONIC TUBE PERCUT W/FLUORO  03/10/2022   PORTACATH PLACEMENT Left 10/12/2021   Procedure: INSERTION PORT-A-CATH;  Surgeon: Aviva Signs, MD;  Location: [facility] ORS;  Service: General;  Laterality: Left;   VIDEO BRONCHOSCOPY WITH RADIAL ENDOBRONCHIAL ULTRASOUND  01/23/2022   Procedure: VIDEO BRONCHOSCOPY WITH RADIAL ENDOBRONCHIAL ULTRASOUND;  Surgeon: Collene Gobble, MD;  Location: [place] ENDOSCOPY;  Service: Pulmonary;;     reports that he has been smoking cigarettes. He has been smoking an average of .5 packs per day. He has never used smokeless tobacco. He reports that he does not currently use  alcohol after a past usage of about 14.0 standard drinks of alcohol per week. He reports that he does not use drugs.  Allergies  Allergen Reactions   Pork-Derived Products Other (See Comments)    Patient does not eat pork due to his religious belief    Family History  Problem Relation Age of Onset   Cancer  Father        prostate   Cancer Sister    Colon cancer Neg Hx    Esophageal cancer Neg Hx     Prior to Admission medications   Medication Sig Start Date End Date Taking? Authorizing Provider  allopurinol (ZYLOPRIM) 300 MG tablet Take 1 tablet (300 mg total) by mouth daily. 08/04/21   Sanjuana Kava, MD  amoxicillin-clavulanate (AUGMENTIN) 875-125 MG tablet Take 1 tablet by mouth every 12 (twelve) hours. 05/22/22   Davonna Belling, MD  azithromycin (ZITHROMAX) 250 MG tablet Take 1 tablet (250 mg total) by mouth daily. Take first 2 tablets together, then 1 every day until finished. 05/22/22   Davonna Belling, MD  ferrous sulfate 325 (65 FE) MG EC tablet Take 1 tablet (325 mg total) by mouth daily with breakfast. 03/23/22   Roxan Hockey, MD  fluorouracil CALGB 93716 2,400 mg/m2 in sodium chloride 0.9 % 150 mL Inject 2,400 mg/m2 into the vein over 48 hr. Every 2 weeks    [provider]  FLUOROURACIL IV Inject into the vein every 14 (fourteen) days.    [provider]  folic acid (FOLVITE) 1 MG tablet Place 1 tablet (1 mg total) into feeding tube daily. 03/13/22   Orson Eva, MD  gabapentin (NEURONTIN) 250 MG/5ML solution Place 6 mLs (300 mg total) into feeding tube at bedtime. 02/22/22   Pokhrel, Corrie Mckusick, MD  LEUCOVORIN CALCIUM IV Inject into the vein every 14 (fourteen) days.    [provider]  megestrol (MEGACE) 400 MG/10ML suspension Take 10 mLs (400 mg total) by mouth 2 (two) times daily. 12/07/21   Derek Jack, MD  metoprolol tartrate (LOPRESSOR) 25 MG tablet Take 0.5 tablets (12.5 mg total) by mouth 2 (two) times daily. 03/23/22   Roxan Hockey, MD  midodrine (PROAMATINE) 5 MG tablet Place 1 tablet (5 mg total) into feeding tube 3 (three) times daily with meals. 03/12/22   Orson Eva, MD  Multiple Vitamins-Minerals (MULTIVITAMIN WITH MINERALS) tablet Take 1 tablet by mouth daily. 03/23/22 03/23/23  Roxan Hockey, MD  nicotine (NICODERM CQ - DOSED  IN MG/24 HOURS) 14 mg/24hr patch Place 1 patch (14 mg total) onto the skin daily as needed (nicotine craving). 03/12/22   Orson Eva, MD  Nutritional Supplements (FEEDING SUPPLEMENT, VITAL 1.5 CAL,) LIQD Place 1,000 mLs into feeding tube continuous. Vital 1.5@ 55 ml/hr per PEG tube - 03/23/22   Emokpae, Courage, MD  OXALIPLATIN IV Inject into the vein every 14 (fourteen) days.    [provider]  oxyCODONE (OXY IR/ROXICODONE) 5 MG immediate release tablet Place 1 tablet (5 mg total) into feeding tube every 8 (eight) hours as needed for moderate pain. 03/23/22   Roxan Hockey, MD  pantoprazole (PROTONIX) 40 MG tablet Take 1 tablet (40 mg total) by mouth daily. 03/24/22   Roxan Hockey, MD  potassium chloride SA (KLOR-CON M20) 20 MEQ tablet Take 1 tablet (20 mEq total) by mouth daily. 03/27/22   Roxan Hockey, MD  prochlorperazine (COMPAZINE) 10 MG tablet Take 1 tablet (10 mg total) by mouth every 6 (six) hours as needed for nausea or vomiting. 10/27/21  Derek Jack, MD  Water For Irrigation, Sterile (FREE WATER) SOLN Place 200 mLs into feeding tube every 6 (six) hours. 03/23/22   Roxan Hockey, MD    Physical Exam: Vitals:   05/28/22 0914 05/28/22 1030 05/28/22 1100 05/28/22 1230  BP:  (!) '87/74 90/75 92/74 '$  Pulse: (!) 120   80  Resp:  '20 19 13  '$ Temp: 97.6 F (36.4 C)     TempSrc: Axillary     SpO2:    98%  Weight:      Height:        Constitutional: severely cachectic male; appears terminally ill; NAD;  Eyes: PERRL, lids and conjunctivae normal ENMT: Mucous membranes are pale and dry.  Posterior pharynx clear of any exudate or lesions.   Neck: normal, supple, no masses, no thyromegaly Respiratory: shallow breath sounds bilateral.   Cardiovascular: normal s1, s2 sounds, no murmurs / rubs / gallops. No extremity edema. 2+ pedal pulses. No carotid bruits.  Abdomen: G tube site inflamed.  Mild tenderness, no masses palpated. No hepatosplenomegaly. Bowel sounds  positive.  Musculoskeletal: no clubbing / cyanosis. No joint deformity upper and lower extremities. Good ROM, no contractures. Normal muscle tone.  Skin: no rashes, lesions, ulcers. No induration Neurologic: CN 2-12 grossly intact. Sensation intact, DTR depressed. Strength 5/5 in all 4.  Psychiatric: Poor judgment and insight. Alert and oriented x 3. Normal mood.   Labs on Admission: I have personally reviewed following labs and imaging studies  CBC: Recent Labs  Lab 05/22/22 1612 05/28/22 0842  WBC 8.8 4.7  NEUTROABS 7.3 2.9  HGB 12.2* 11.0*  HCT 37.1* 33.6*  MCV 77.5* 77.2*  PLT 263 762   Basic Metabolic Panel: Recent Labs  Lab 05/22/22 1612 05/28/22 0842  NA 128* 131*  K 3.9 3.6  CL 85* 89*  CO2 27 30  GLUCOSE 50* 72  BUN 31* 29*  CREATININE 1.12 0.79  CALCIUM 8.9 9.0  MG  --  2.0   GFR: Estimated Creatinine Clearance: 59.6 mL/min (by C-G formula based on SCr of 0.79 mg/dL). Liver Function Tests: Recent Labs  Lab 05/22/22 1612 05/28/22 0842  AST 31 23  ALT 9 11  ALKPHOS 65 91  BILITOT 1.1 0.8  PROT 6.9 6.5  ALBUMIN 2.5* 2.2*   No results for input(s): "LIPASE", "AMYLASE" in the last 168 hours. No results for input(s): "AMMONIA" in the last 168 hours. Coagulation Profile: No results for input(s): "INR", "PROTIME" in the last 168 hours. Cardiac Enzymes: No results for input(s): "CKTOTAL", "CKMB", "CKMBINDEX", "TROPONINI" in the last 168 hours. BNP (last 3 results) No results for input(s): "PROBNP" in the last 8760 hours. HbA1C: No results for input(s): "HGBA1C" in the last 72 hours. CBG: Recent Labs  Lab 05/22/22 1910 05/22/22 2109  GLUCAP 51* 139*   Lipid Profile: No results for input(s): "CHOL", "HDL", "LDLCALC", "TRIG", "CHOLHDL", "LDLDIRECT" in the last 72 hours. Thyroid Function Tests: No results for input(s): "TSH", "T4TOTAL", "FREET4", "T3FREE", "THYROIDAB" in the last 72 hours. Anemia Panel: No results for input(s): "VITAMINB12",  "FOLATE", "FERRITIN", "TIBC", "IRON", "RETICCTPCT" in the last 72 hours. Urine analysis:    Component Value Date/Time   COLORURINE AMBER (A) 02/16/2022 0500   APPEARANCEUR CLOUDY (A) 02/16/2022 0500   LABSPEC >1.046 (H) 02/16/2022 0500   PHURINE 5.0 02/16/2022 0500   GLUCOSEU NEGATIVE 02/16/2022 0500   HGBUR NEGATIVE 02/16/2022 0500   BILIRUBINUR SMALL (A) 02/16/2022 0500   KETONESUR NEGATIVE 02/16/2022 0500   PROTEINUR 30 (A) 02/16/2022 0500  NITRITE NEGATIVE 02/16/2022 0500   LEUKOCYTESUR NEGATIVE 02/16/2022 0500    Radiological Exams on Admission: CT Angio Chest PE W and/or Wo Contrast  Result Date: 05/28/2022 CLINICAL DATA:  Shortness of breath.Abdominal pain. History of esophageal cancer. * Tracking Code: BO * EXAM: CT ANGIOGRAPHY CHEST CT ABDOMEN AND PELVIS WITH CONTRAST TECHNIQUE: Multidetector CT imaging of the chest was performed using the standard protocol during bolus administration of intravenous contrast. Multiplanar CT image reconstructions and MIPs were obtained to evaluate the vascular anatomy. Multidetector CT imaging of the abdomen and pelvis was performed using the standard protocol during bolus administration of intravenous contrast. RADIATION DOSE REDUCTION: This exam was performed according to the departmental dose-optimization program which includes automated exposure control, adjustment of the mA and/or kV according to patient size and/or use of iterative reconstruction technique. CONTRAST:  139m OMNIPAQUE IOHEXOL 350 MG/ML SOLN COMPARISON:  01/31/2022 FINDINGS: CTA CHEST FINDINGS Cardiovascular: The heart size is normal. No substantial pericardial effusion. No thoracic aortic aneurysm. There is no filling defect in the opacified pulmonary arteries to suggest the presence of an acute pulmonary embolus. Left Port-A-Cath tip is positioned in the distal SVC. Mediastinum/Nodes: Diffusely increased attenuation of mediastinal fat may reflect edema. No mediastinal  lymphadenopathy. There is no hilar lymphadenopathy. Proximal esophagus is patulous with food debris in the proximal and mid segment compatible with the known neoplasm and similar to prior. There is no axillary lymphadenopathy. Lungs/Pleura: Circumferential bronchial wall thickening is noted in both lungs, right greater than left. There is extensive tree-in-bud nodularity in the right lung and left lower lobe with areas of consolidative airspace disease in the right upper lobe measuring 2.7 x 1.3 cm. Patchy confluent airspace disease is superimposed on the nodularity in the right middle lobe. There is complete collapse of the right lower lobe with obliteration of the bronchus intermedius. 2.0 x 1.6 cm collection of gas and fluid in the medial right lower lobe may represent an area of necrosis with fistula formation or abscess. Small right pleural effusion evident. Musculoskeletal: Multiple healed right rib fractures noted. There also multiple healed fractures on the left. Review of the MIP images confirms the above findings. CT ABDOMEN and PELVIS FINDINGS Hepatobiliary: Tiny hypodensity in the medial segment left liver is stable in the interval, likely benign. Gallbladder not well visualized. No intrahepatic or extrahepatic biliary dilation. Pancreas: No focal mass lesion. No dilatation of the main duct. No intraparenchymal cyst. No peripancreatic edema. Spleen: No splenomegaly. No focal mass lesion. Adrenals/Urinary Tract: No adrenal nodule or mass. Tiny hypodensities in both kidneys are too small to characterize, similar to prior likely benign. No hydroureter. Bladder unremarkable. Stomach/Bowel: Stomach is nondistended. Gastrostomy tube evident. Scattered mildly distended fluid-filled small bowel loops in the abdomen and pelvis measuring up to 3.4 cm diameter. Neither the terminal ileum nor the appendix are discretely visible. Gas and fluid is scattered along the length of the mildly distended colon with gas and  fluid visible in the rectum. This appearance can be seen in the setting of diarrhea. Vascular/Lymphatic: There is mild atherosclerotic calcification of the abdominal aorta without aneurysm. There is no gastrohepatic or hepatoduodenal ligament lymphadenopathy. No retroperitoneal or mesenteric lymphadenopathy. No pelvic sidewall lymphadenopathy. Assessment for lymphadenopathy is limited by patient cachexia and diffuse edema soft tissues. Reproductive: The prostate gland and seminal vesicles are unremarkable. Other: Probable small volume intraperitoneal free fluid. Diffuse edema of mesentery and extraperitoneal tissues. Musculoskeletal: Bone windows reveal no worrisome lytic or sclerotic osseous lesions. Mild wedge compression deformity is  seen at T4 and T7, similar to prior. Diffuse body wall edema evident. Review of the MIP images confirms the above findings. IMPRESSION: 1. No CT evidence for acute pulmonary embolus. 2. Marked progression of extensive tree-in-bud nodularity in both lungs, right greater than left, with areas of consolidative airspace disease in the right upper lobe and right middle lobe. Imaging features are compatible with an infectious/inflammatory etiology. Aspiration not excluded. 3. Complete collapse of the right lower lobe with obliteration of the bronchus intermedius. 2.0 x 1.6 cm collection of gas and fluid in the medial right lower lobe is progressive in the interval and may represent an area of necrosis with fistula formation or abscess. 4. Small right pleural effusion. 5. Scattered mildly distended fluid-filled small bowel loops in the abdomen and pelvis measuring up to 3.4 cm diameter. Gas and fluid is scattered along the length of the mildly distended colon with gas and fluid visible in the rectum. This appearance can be seen in the setting of diarrhea. Generally, imaging features suggest component of underlying dysmotility/ileus 6. Marked cachexia with diffuse edema of the mesentery,  extraperitoneal fat, and body wall. 7. Patulous proximal and mid esophagus with debris in the lumen, compatible with the patient's known esophageal neoplasm. Electronically Signed   By: Misty Stanley M.D.   On: 05/28/2022 12:49   CT ABDOMEN PELVIS W CONTRAST  Result Date: 05/28/2022 CLINICAL DATA:  Shortness of breath.Abdominal pain. History of esophageal cancer. * Tracking Code: BO * EXAM: CT ANGIOGRAPHY CHEST CT ABDOMEN AND PELVIS WITH CONTRAST TECHNIQUE: Multidetector CT imaging of the chest was performed using the standard protocol during bolus administration of intravenous contrast. Multiplanar CT image reconstructions and MIPs were obtained to evaluate the vascular anatomy. Multidetector CT imaging of the abdomen and pelvis was performed using the standard protocol during bolus administration of intravenous contrast. RADIATION DOSE REDUCTION: This exam was performed according to the departmental dose-optimization program which includes automated exposure control, adjustment of the mA and/or kV according to patient size and/or use of iterative reconstruction technique. CONTRAST:  157m OMNIPAQUE IOHEXOL 350 MG/ML SOLN COMPARISON:  01/31/2022 FINDINGS: CTA CHEST FINDINGS Cardiovascular: The heart size is normal. No substantial pericardial effusion. No thoracic aortic aneurysm. There is no filling defect in the opacified pulmonary arteries to suggest the presence of an acute pulmonary embolus. Left Port-A-Cath tip is positioned in the distal SVC. Mediastinum/Nodes: Diffusely increased attenuation of mediastinal fat may reflect edema. No mediastinal lymphadenopathy. There is no hilar lymphadenopathy. Proximal esophagus is patulous with food debris in the proximal and mid segment compatible with the known neoplasm and similar to prior. There is no axillary lymphadenopathy. Lungs/Pleura: Circumferential bronchial wall thickening is noted in both lungs, right greater than left. There is extensive tree-in-bud  nodularity in the right lung and left lower lobe with areas of consolidative airspace disease in the right upper lobe measuring 2.7 x 1.3 cm. Patchy confluent airspace disease is superimposed on the nodularity in the right middle lobe. There is complete collapse of the right lower lobe with obliteration of the bronchus intermedius. 2.0 x 1.6 cm collection of gas and fluid in the medial right lower lobe may represent an area of necrosis with fistula formation or abscess. Small right pleural effusion evident. Musculoskeletal: Multiple healed right rib fractures noted. There also multiple healed fractures on the left. Review of the MIP images confirms the above findings. CT ABDOMEN and PELVIS FINDINGS Hepatobiliary: Tiny hypodensity in the medial segment left liver is stable in the interval, likely  benign. Gallbladder not well visualized. No intrahepatic or extrahepatic biliary dilation. Pancreas: No focal mass lesion. No dilatation of the main duct. No intraparenchymal cyst. No peripancreatic edema. Spleen: No splenomegaly. No focal mass lesion. Adrenals/Urinary Tract: No adrenal nodule or mass. Tiny hypodensities in both kidneys are too small to characterize, similar to prior likely benign. No hydroureter. Bladder unremarkable. Stomach/Bowel: Stomach is nondistended. Gastrostomy tube evident. Scattered mildly distended fluid-filled small bowel loops in the abdomen and pelvis measuring up to 3.4 cm diameter. Neither the terminal ileum nor the appendix are discretely visible. Gas and fluid is scattered along the length of the mildly distended colon with gas and fluid visible in the rectum. This appearance can be seen in the setting of diarrhea. Vascular/Lymphatic: There is mild atherosclerotic calcification of the abdominal aorta without aneurysm. There is no gastrohepatic or hepatoduodenal ligament lymphadenopathy. No retroperitoneal or mesenteric lymphadenopathy. No pelvic sidewall lymphadenopathy. Assessment for  lymphadenopathy is limited by patient cachexia and diffuse edema soft tissues. Reproductive: The prostate gland and seminal vesicles are unremarkable. Other: Probable small volume intraperitoneal free fluid. Diffuse edema of mesentery and extraperitoneal tissues. Musculoskeletal: Bone windows reveal no worrisome lytic or sclerotic osseous lesions. Mild wedge compression deformity is seen at T4 and T7, similar to prior. Diffuse body wall edema evident. Review of the MIP images confirms the above findings. IMPRESSION: 1. No CT evidence for acute pulmonary embolus. 2. Marked progression of extensive tree-in-bud nodularity in both lungs, right greater than left, with areas of consolidative airspace disease in the right upper lobe and right middle lobe. Imaging features are compatible with an infectious/inflammatory etiology. Aspiration not excluded. 3. Complete collapse of the right lower lobe with obliteration of the bronchus intermedius. 2.0 x 1.6 cm collection of gas and fluid in the medial right lower lobe is progressive in the interval and may represent an area of necrosis with fistula formation or abscess. 4. Small right pleural effusion. 5. Scattered mildly distended fluid-filled small bowel loops in the abdomen and pelvis measuring up to 3.4 cm diameter. Gas and fluid is scattered along the length of the mildly distended colon with gas and fluid visible in the rectum. This appearance can be seen in the setting of diarrhea. Generally, imaging features suggest component of underlying dysmotility/ileus 6. Marked cachexia with diffuse edema of the mesentery, extraperitoneal fat, and body wall. 7. Patulous proximal and mid esophagus with debris in the lumen, compatible with the patient's known esophageal neoplasm. Electronically Signed   By: Misty Stanley M.D.   On: 05/28/2022 12:49   DG Chest Portable 1 View  Result Date: 05/28/2022 CLINICAL DATA:  Shortness of breath. EXAM: PORTABLE CHEST 1 VIEW COMPARISON:   05/22/2022 FINDINGS: Lungs are hyperexpanded. Left lung clear. Increasing consolidative opacity at the right base with patchy airspace disease in the right mid and upper lung similar to prior. The cardio pericardial silhouette is enlarged. Left Port-A-Cath tip overlies the distal SVC. Telemetry leads overlie the chest. IMPRESSION: Increasing consolidative opacity at the right base with patchy airspace disease in the right mid and upper lung. Electronically Signed   By: Misty Stanley M.D.   On: 05/28/2022 09:05    EKG: Independently reviewed.   Assessment/Plan Principal Problem:   Aspiration pneumonia (HCC) Active Problems:   Gout   GERD (gastroesophageal reflux disease)   Alcohol abuse   Tobacco abuse   Vitamin D deficiency   Stage IV B Squamous cell esophageal cancer (HCC)   Protein-calorie malnutrition, severe   Cavitary pneumonia/?? Lung  Abscess   Hyponatremia   PEG (percutaneous endoscopic gastrostomy) status (HCC)   FTT (failure to thrive) in adult   Falls frequently   Gait instability   Sepsis due to pneumonia ([place])   SIADH (syndrome of inappropriate ADH production) ([place])   Aspiration Pneumonia  -pt reports that he has been aspirating his secretions -he is also likely aspirating his oral consumption as well -continue ampicillin/sulbactam -aspiration precautions -PT INSISTS ON ORAL EATING DESPITE RISKS -SLP evaluation   Abscess Lung -continue IV ampicillin/sulbactam -consultation to pulmonology  -continue supportive measures   Stage IV B Squamous Cell Esophageal Cancer -pt continues to be treated at [facility]  -pt insists that he be discharged so he can get radiation treatment on 2/6 -ongoing goals of care need to be addressed -palliative care consulted, may need ethics consult   Chronic Hypotension -continue midodrine 5 mg TID   Hyponatremia  -SIADH -IV fluids as ordered   Adult FTT -pt rapidly declining  -palliative consult requested -ethics may need  to be involved  Sepsis due to pneumonia -IV fluid resuscitation ordered -aggressive IV antibiotics given -supportive measures -follow up cultures   Lactic acidosis  -suspect this elevation is due to cancer and infection -IV fluid bolus ordered -recheck lactic acid after fluid boluses -lactate elevation may not correct due to aggressive cancer  DVT prophylaxis: rivaroxaban  Code Status: full   Family Communication: plan discussed with patient at bedside, he verbalized understanding   Disposition Plan: return to [facility], pt was in agreement   Consults called: pulmonology   Admission status: INP  Level of care: Med-Surg Irwin Brakeman MD [facility] How to contact the [facility] Attending or Consulting provider [place] or covering provider during after hours [place], for this patient?  Check the care team in [facility] and look for a) attending/consulting [facility] provider listed and b) the [facility] team listed Log into [email or web address] and use [place]'s universal password to access. If you do not have the password, please contact the hospital operator. Locate the [facility] provider you are looking for under [facility] and page to a number that you can be directly reached. If you still have difficulty reaching the provider, please page the [facility] (Director on Call) for the Hospitalists listed on amion for assistance.   If 7PM-7AM, please contact night-coverage [email or web address] Password [email or web address]  05/28/2022, 1:30 PM

## 2022-05-28 NOTE — ED Notes (Signed)
Date and time results received: 05/28/22 '@1227'$    Test: lactic acid  Critical Value: 2.5  Name of Provider Notified: Dr Doren Custard   Orders Received? Or Actions Taken?: No orders given at this time.

## 2022-05-28 NOTE — Progress Notes (Signed)
Pt refused tube feeding.

## 2022-05-28 NOTE — ED Triage Notes (Signed)
Patient brought in via EMS from [facility]. Patient alert and oriented. Patient c/o shortness of breath. Per staff patient started becoming short of breath last night and progressively getting worse. Patient on non-rebreather, sating at 95%. Per paramedic patient's O2 sat was 76% on 3 liters via [place]. Patient c/o pain at site of feeding tube (left upper abd). Per staff patient still eats food despite feeding tube.

## 2022-05-28 NOTE — Progress Notes (Signed)
Pt has refused tube feeding because diet was downgraded to dysphagia II. He wants a regular diet. I have attempted to offer appropriate items and tube feeding and he keeps refusing. He wants to talk to a doctor to resolve this. I will page on-call physician

## 2022-05-28 NOTE — ED Provider Notes (Signed)
[facility] Provider Note   CSN: [id number] Arrival date & time: 05/28/22  5726     History  Chief Complaint  Patient presents with   Shortness of Breath    Trevor [name] is a 52 y.o. male.   Shortness of Breath Associated symptoms: abdominal pain   Patient presents from skilled nursing facility by EMS for shortness of breath and hypoxia.  Medical history includes HTN, gout, alcohol abuse, GERD, stage IV esophageal cancer.  He was most recently seen in the ED 6 days ago.  X-ray at that time showed right-sided pneumonia.  He refused admission to the hospital at that time.  He was prescribed oral antibiotics and left AMA.  Per EMS, shortness of breath began last night.  At baseline, he typically does not wear supplemental oxygen but has been wearing it over the past few days.  When they arrived on scene, SpO2 of 76% on 3 L.  He was placed on nonrebreather with normalization of SpO2.  Patient states that his symptoms began this morning.  He endorses pain in the area of his G-tube.  Patient reports that he gets tube feedings at night but eats during the day by mouth.  Patient is reportedly on palliative care, however, he does state that he wishes to be full code.     Home Medications Prior to Admission medications   Medication Sig Start Date End Date Taking? Authorizing Provider  allopurinol (ZYLOPRIM) 300 MG tablet Take 1 tablet (300 mg total) by mouth daily. 08/04/21   Sanjuana Kava, MD  amoxicillin-clavulanate (AUGMENTIN) 875-125 MG tablet Take 1 tablet by mouth every 12 (twelve) hours. 05/22/22   Davonna Belling, MD  azithromycin (ZITHROMAX) 250 MG tablet Take 1 tablet (250 mg total) by mouth daily. Take first 2 tablets together, then 1 every day until finished. 05/22/22   Davonna Belling, MD  ferrous sulfate 325 (65 FE) MG EC tablet Take 1 tablet (325 mg total) by mouth daily with breakfast. 03/23/22   Roxan Hockey, MD  fluorouracil CALGB  20355 2,400 mg/m2 in sodium chloride 0.9 % 150 mL Inject 2,400 mg/m2 into the vein over 48 hr. Every 2 weeks    [provider]  FLUOROURACIL IV Inject into the vein every 14 (fourteen) days.    [provider]  folic acid (FOLVITE) 1 MG tablet Place 1 tablet (1 mg total) into feeding tube daily. 03/13/22   Orson Eva, MD  gabapentin (NEURONTIN) 250 MG/5ML solution Place 6 mLs (300 mg total) into feeding tube at bedtime. 02/22/22   Pokhrel, Corrie Mckusick, MD  LEUCOVORIN CALCIUM IV Inject into the vein every 14 (fourteen) days.    [provider]  megestrol (MEGACE) 400 MG/10ML suspension Take 10 mLs (400 mg total) by mouth 2 (two) times daily. 12/07/21   Derek Jack, MD  metoprolol tartrate (LOPRESSOR) 25 MG tablet Take 0.5 tablets (12.5 mg total) by mouth 2 (two) times daily. 03/23/22   Roxan Hockey, MD  midodrine (PROAMATINE) 5 MG tablet Place 1 tablet (5 mg total) into feeding tube 3 (three) times daily with meals. 03/12/22   Orson Eva, MD  Multiple Vitamins-Minerals (MULTIVITAMIN WITH MINERALS) tablet Take 1 tablet by mouth daily. 03/23/22 03/23/23  Roxan Hockey, MD  nicotine (NICODERM CQ - DOSED IN MG/24 HOURS) 14 mg/24hr patch Place 1 patch (14 mg total) onto the skin daily as needed (nicotine craving). 03/12/22   Orson Eva, MD  Nutritional Supplements (FEEDING SUPPLEMENT, VITAL 1.5 CAL,) LIQD  Place 1,000 mLs into feeding tube continuous. Vital 1.5@ 55 ml/hr per PEG tube - 03/23/22   Emokpae, Courage, MD  OXALIPLATIN IV Inject into the vein every 14 (fourteen) days.    [provider]  oxyCODONE (OXY IR/ROXICODONE) 5 MG immediate release tablet Place 1 tablet (5 mg total) into feeding tube every 8 (eight) hours as needed for moderate pain. 03/23/22   Roxan Hockey, MD  pantoprazole (PROTONIX) 40 MG tablet Take 1 tablet (40 mg total) by mouth daily. 03/24/22   Roxan Hockey, MD  potassium chloride SA (KLOR-CON M20) 20 MEQ tablet Take 1 tablet (20  mEq total) by mouth daily. 03/27/22   Roxan Hockey, MD  prochlorperazine (COMPAZINE) 10 MG tablet Take 1 tablet (10 mg total) by mouth every 6 (six) hours as needed for nausea or vomiting. 10/27/21   Derek Jack, MD  Water For Irrigation, Sterile (FREE WATER) SOLN Place 200 mLs into feeding tube every 6 (six) hours. 03/23/22   Roxan Hockey, MD      Allergies    Pork-derived products    Review of Systems   Review of Systems  Constitutional:  Positive for fatigue.  Respiratory:  Positive for shortness of breath.   Gastrointestinal:  Positive for abdominal pain.  All other systems reviewed and are negative.   Physical Exam Updated Vital Signs BP 92/74   Pulse 80   Temp 97.6 F (36.4 C) (Axillary)   Resp 13   Ht '5\' 6"'$  (1.676 m)   Wt 39.9 kg   SpO2 98%   BMI 14.20 kg/m  Physical Exam Vitals and nursing note reviewed.  Constitutional:      General: He is not in acute distress.    Appearance: He is cachectic. He is ill-appearing.  HENT:     Head: Normocephalic and atraumatic.     Mouth/Throat:     Mouth: Mucous membranes are moist.  Eyes:     Conjunctiva/sclera: Conjunctivae normal.  Cardiovascular:     Rate and Rhythm: Regular rhythm. Tachycardia present.     Heart sounds: No murmur heard. Pulmonary:     Effort: Pulmonary effort is normal. Tachypnea present. No respiratory distress.     Breath sounds: Examination of the right-upper field reveals decreased breath sounds. Examination of the right-middle field reveals decreased breath sounds. Decreased breath sounds and rhonchi present.  Chest:     Chest wall: No tenderness.  Abdominal:     Palpations: Abdomen is soft.     Tenderness: There is abdominal tenderness.  Musculoskeletal:        General: No swelling.     Cervical back: Normal range of motion and neck supple.     Right lower leg: No edema.     Left lower leg: No edema.  Skin:    General: Skin is warm and dry.     Coloration: Skin is not cyanotic  or pale.  Neurological:     General: No focal deficit present.     Mental Status: He is alert and oriented to person, place, and time.  Psychiatric:        Mood and Affect: Mood normal.        Behavior: Behavior normal.     ED Results / Procedures / Treatments   Labs (all labs ordered are listed, but only abnormal results are displayed) Labs Reviewed  COMPREHENSIVE METABOLIC PANEL - Abnormal; Notable for the following components:      Result Value   Sodium 131 (*)    Chloride  89 (*)    BUN 29 (*)    Albumin 2.2 (*)    All other components within normal limits  BLOOD GAS, VENOUS - Abnormal; Notable for the following components:   pH, Ven 7.48 (*)    pO2, Ven <31 (*)    Bicarbonate 36.4 (*)    Acid-Base Excess 10.9 (*)    All other components within normal limits  CBC WITH DIFFERENTIAL/PLATELET - Abnormal; Notable for the following components:   Hemoglobin 11.0 (*)    HCT 33.6 (*)    MCV 77.2 (*)    MCH 25.3 (*)    All other components within normal limits  LACTIC ACID, PLASMA - Abnormal; Notable for the following components:   Lactic Acid, Venous 2.5 (*)    All other components within normal limits  RESP PANEL BY RT-PCR (RSV, FLU A&B, COVID)  RVPGX2  CULTURE, BLOOD (ROUTINE X 2)  CULTURE, BLOOD (ROUTINE X 2)  MAGNESIUM  LACTIC ACID, PLASMA    EKG EKG Interpretation  Date/Time:  Sunday May 28 2022 09:17:58 EST Ventricular Rate:  107 PR Interval:  151 QRS Duration: 83 QT Interval:  324 QTC Calculation: 433 R Axis:   -37 Text Interpretation: Sinus tachycardia Left axis deviation Probable anteroseptal infarct, old Confirmed by Godfrey Pick [id number]) on 05/28/2022 10:32:41 AM  Radiology CT Angio Chest PE W and/or Wo Contrast  Result Date: 05/28/2022 CLINICAL DATA:  Shortness of breath.Abdominal pain. History of esophageal cancer. * Tracking Code: BO * EXAM: CT ANGIOGRAPHY CHEST CT ABDOMEN AND PELVIS WITH CONTRAST TECHNIQUE: Multidetector CT imaging of the chest was  performed using the standard protocol during bolus administration of intravenous contrast. Multiplanar CT image reconstructions and MIPs were obtained to evaluate the vascular anatomy. Multidetector CT imaging of the abdomen and pelvis was performed using the standard protocol during bolus administration of intravenous contrast. RADIATION DOSE REDUCTION: This exam was performed according to the departmental dose-optimization program which includes automated exposure control, adjustment of the mA and/or kV according to patient size and/or use of iterative reconstruction technique. CONTRAST:  172m OMNIPAQUE IOHEXOL 350 MG/ML SOLN COMPARISON:  01/31/2022 FINDINGS: CTA CHEST FINDINGS Cardiovascular: The heart size is normal. No substantial pericardial effusion. No thoracic aortic aneurysm. There is no filling defect in the opacified pulmonary arteries to suggest the presence of an acute pulmonary embolus. Left Port-A-Cath tip is positioned in the distal SVC. Mediastinum/Nodes: Diffusely increased attenuation of mediastinal fat may reflect edema. No mediastinal lymphadenopathy. There is no hilar lymphadenopathy. Proximal esophagus is patulous with food debris in the proximal and mid segment compatible with the known neoplasm and similar to prior. There is no axillary lymphadenopathy. Lungs/Pleura: Circumferential bronchial wall thickening is noted in both lungs, right greater than left. There is extensive tree-in-bud nodularity in the right lung and left lower lobe with areas of consolidative airspace disease in the right upper lobe measuring 2.7 x 1.3 cm. Patchy confluent airspace disease is superimposed on the nodularity in the right middle lobe. There is complete collapse of the right lower lobe with obliteration of the bronchus intermedius. 2.0 x 1.6 cm collection of gas and fluid in the medial right lower lobe may represent an area of necrosis with fistula formation or abscess. Small right pleural effusion evident.  Musculoskeletal: Multiple healed right rib fractures noted. There also multiple healed fractures on the left. Review of the MIP images confirms the above findings. CT ABDOMEN and PELVIS FINDINGS Hepatobiliary: Tiny hypodensity in the medial segment left liver is stable in  the interval, likely benign. Gallbladder not well visualized. No intrahepatic or extrahepatic biliary dilation. Pancreas: No focal mass lesion. No dilatation of the main duct. No intraparenchymal cyst. No peripancreatic edema. Spleen: No splenomegaly. No focal mass lesion. Adrenals/Urinary Tract: No adrenal nodule or mass. Tiny hypodensities in both kidneys are too small to characterize, similar to prior likely benign. No hydroureter. Bladder unremarkable. Stomach/Bowel: Stomach is nondistended. Gastrostomy tube evident. Scattered mildly distended fluid-filled small bowel loops in the abdomen and pelvis measuring up to 3.4 cm diameter. Neither the terminal ileum nor the appendix are discretely visible. Gas and fluid is scattered along the length of the mildly distended colon with gas and fluid visible in the rectum. This appearance can be seen in the setting of diarrhea. Vascular/Lymphatic: There is mild atherosclerotic calcification of the abdominal aorta without aneurysm. There is no gastrohepatic or hepatoduodenal ligament lymphadenopathy. No retroperitoneal or mesenteric lymphadenopathy. No pelvic sidewall lymphadenopathy. Assessment for lymphadenopathy is limited by patient cachexia and diffuse edema soft tissues. Reproductive: The prostate gland and seminal vesicles are unremarkable. Other: Probable small volume intraperitoneal free fluid. Diffuse edema of mesentery and extraperitoneal tissues. Musculoskeletal: Bone windows reveal no worrisome lytic or sclerotic osseous lesions. Mild wedge compression deformity is seen at T4 and T7, similar to prior. Diffuse body wall edema evident. Review of the MIP images confirms the above findings.  IMPRESSION: 1. No CT evidence for acute pulmonary embolus. 2. Marked progression of extensive tree-in-bud nodularity in both lungs, right greater than left, with areas of consolidative airspace disease in the right upper lobe and right middle lobe. Imaging features are compatible with an infectious/inflammatory etiology. Aspiration not excluded. 3. Complete collapse of the right lower lobe with obliteration of the bronchus intermedius. 2.0 x 1.6 cm collection of gas and fluid in the medial right lower lobe is progressive in the interval and may represent an area of necrosis with fistula formation or abscess. 4. Small right pleural effusion. 5. Scattered mildly distended fluid-filled small bowel loops in the abdomen and pelvis measuring up to 3.4 cm diameter. Gas and fluid is scattered along the length of the mildly distended colon with gas and fluid visible in the rectum. This appearance can be seen in the setting of diarrhea. Generally, imaging features suggest component of underlying dysmotility/ileus 6. Marked cachexia with diffuse edema of the mesentery, extraperitoneal fat, and body wall. 7. Patulous proximal and mid esophagus with debris in the lumen, compatible with the patient's known esophageal neoplasm. Electronically Signed   By: Misty Stanley M.D.   On: 05/28/2022 12:49   CT ABDOMEN PELVIS W CONTRAST  Result Date: 05/28/2022 CLINICAL DATA:  Shortness of breath.Abdominal pain. History of esophageal cancer. * Tracking Code: BO * EXAM: CT ANGIOGRAPHY CHEST CT ABDOMEN AND PELVIS WITH CONTRAST TECHNIQUE: Multidetector CT imaging of the chest was performed using the standard protocol during bolus administration of intravenous contrast. Multiplanar CT image reconstructions and MIPs were obtained to evaluate the vascular anatomy. Multidetector CT imaging of the abdomen and pelvis was performed using the standard protocol during bolus administration of intravenous contrast. RADIATION DOSE REDUCTION: This exam  was performed according to the departmental dose-optimization program which includes automated exposure control, adjustment of the mA and/or kV according to patient size and/or use of iterative reconstruction technique. CONTRAST:  170m OMNIPAQUE IOHEXOL 350 MG/ML SOLN COMPARISON:  01/31/2022 FINDINGS: CTA CHEST FINDINGS Cardiovascular: The heart size is normal. No substantial pericardial effusion. No thoracic aortic aneurysm. There is no filling defect in the opacified pulmonary  arteries to suggest the presence of an acute pulmonary embolus. Left Port-A-Cath tip is positioned in the distal SVC. Mediastinum/Nodes: Diffusely increased attenuation of mediastinal fat may reflect edema. No mediastinal lymphadenopathy. There is no hilar lymphadenopathy. Proximal esophagus is patulous with food debris in the proximal and mid segment compatible with the known neoplasm and similar to prior. There is no axillary lymphadenopathy. Lungs/Pleura: Circumferential bronchial wall thickening is noted in both lungs, right greater than left. There is extensive tree-in-bud nodularity in the right lung and left lower lobe with areas of consolidative airspace disease in the right upper lobe measuring 2.7 x 1.3 cm. Patchy confluent airspace disease is superimposed on the nodularity in the right middle lobe. There is complete collapse of the right lower lobe with obliteration of the bronchus intermedius. 2.0 x 1.6 cm collection of gas and fluid in the medial right lower lobe may represent an area of necrosis with fistula formation or abscess. Small right pleural effusion evident. Musculoskeletal: Multiple healed right rib fractures noted. There also multiple healed fractures on the left. Review of the MIP images confirms the above findings. CT ABDOMEN and PELVIS FINDINGS Hepatobiliary: Tiny hypodensity in the medial segment left liver is stable in the interval, likely benign. Gallbladder not well visualized. No intrahepatic or extrahepatic  biliary dilation. Pancreas: No focal mass lesion. No dilatation of the main duct. No intraparenchymal cyst. No peripancreatic edema. Spleen: No splenomegaly. No focal mass lesion. Adrenals/Urinary Tract: No adrenal nodule or mass. Tiny hypodensities in both kidneys are too small to characterize, similar to prior likely benign. No hydroureter. Bladder unremarkable. Stomach/Bowel: Stomach is nondistended. Gastrostomy tube evident. Scattered mildly distended fluid-filled small bowel loops in the abdomen and pelvis measuring up to 3.4 cm diameter. Neither the terminal ileum nor the appendix are discretely visible. Gas and fluid is scattered along the length of the mildly distended colon with gas and fluid visible in the rectum. This appearance can be seen in the setting of diarrhea. Vascular/Lymphatic: There is mild atherosclerotic calcification of the abdominal aorta without aneurysm. There is no gastrohepatic or hepatoduodenal ligament lymphadenopathy. No retroperitoneal or mesenteric lymphadenopathy. No pelvic sidewall lymphadenopathy. Assessment for lymphadenopathy is limited by patient cachexia and diffuse edema soft tissues. Reproductive: The prostate gland and seminal vesicles are unremarkable. Other: Probable small volume intraperitoneal free fluid. Diffuse edema of mesentery and extraperitoneal tissues. Musculoskeletal: Bone windows reveal no worrisome lytic or sclerotic osseous lesions. Mild wedge compression deformity is seen at T4 and T7, similar to prior. Diffuse body wall edema evident. Review of the MIP images confirms the above findings. IMPRESSION: 1. No CT evidence for acute pulmonary embolus. 2. Marked progression of extensive tree-in-bud nodularity in both lungs, right greater than left, with areas of consolidative airspace disease in the right upper lobe and right middle lobe. Imaging features are compatible with an infectious/inflammatory etiology. Aspiration not excluded. 3. Complete collapse of  the right lower lobe with obliteration of the bronchus intermedius. 2.0 x 1.6 cm collection of gas and fluid in the medial right lower lobe is progressive in the interval and may represent an area of necrosis with fistula formation or abscess. 4. Small right pleural effusion. 5. Scattered mildly distended fluid-filled small bowel loops in the abdomen and pelvis measuring up to 3.4 cm diameter. Gas and fluid is scattered along the length of the mildly distended colon with gas and fluid visible in the rectum. This appearance can be seen in the setting of diarrhea. Generally, imaging features suggest component of underlying  dysmotility/ileus 6. Marked cachexia with diffuse edema of the mesentery, extraperitoneal fat, and body wall. 7. Patulous proximal and mid esophagus with debris in the lumen, compatible with the patient's known esophageal neoplasm. Electronically Signed   By: Misty Stanley M.D.   On: 05/28/2022 12:49   DG Chest Portable 1 View  Result Date: 05/28/2022 CLINICAL DATA:  Shortness of breath. EXAM: PORTABLE CHEST 1 VIEW COMPARISON:  05/22/2022 FINDINGS: Lungs are hyperexpanded. Left lung clear. Increasing consolidative opacity at the right base with patchy airspace disease in the right mid and upper lung similar to prior. The cardio pericardial silhouette is enlarged. Left Port-A-Cath tip overlies the distal SVC. Telemetry leads overlie the chest. IMPRESSION: Increasing consolidative opacity at the right base with patchy airspace disease in the right mid and upper lung. Electronically Signed   By: Misty Stanley M.D.   On: 05/28/2022 09:05    Procedures Procedures    Medications Ordered in ED Medications  lactated ringers bolus 500 mL (has no administration in time range)  lactated ringers infusion (has no administration in time range)  vancomycin (VANCOCIN) IVPB 1000 mg/200 mL premix (has no administration in time range)    Followed by  vancomycin (VANCOREADY) IVPB 500 mg/100 mL (has no  administration in time range)  cefTRIAXone (ROCEPHIN) 1 g in sodium chloride 0.9 % 100 mL IVPB (0 g Intravenous Stopped 05/28/22 1059)  azithromycin (ZITHROMAX) 500 mg in sodium chloride 0.9 % 250 mL IVPB (0 mg Intravenous Stopped 05/28/22 1149)  lactated ringers bolus 1,000 mL (0 mLs Intravenous Stopped 05/28/22 1309)  fentaNYL (SUBLIMAZE) injection 50 mcg (50 mcg Intravenous Given 05/28/22 1058)  iohexol (OMNIPAQUE) 350 MG/ML injection 100 mL (100 mLs Intravenous Contrast Given 05/28/22 1204)    ED Course/ Medical Decision Making/ A&P                             Medical Decision Making Amount and/or Complexity of Data Reviewed Labs: ordered. Radiology: ordered.  Risk Prescription drug management.   This patient presents to the ED for concern of shortness of breath, this involves an extensive number of treatment options, and is a complaint that carries with it a high risk of complications and morbidity.  The differential diagnosis includes pneumonia, pneumothorax, pulmonary edema, reactive airway disease, anemia   Co morbidities that complicate the patient evaluation  HTN, gout, alcohol abuse, GERD, stage IV esophageal cancer   Additional history obtained:  Additional history obtained from EMS, patient's brother External records from outside source obtained and reviewed including EMR   Lab Tests:  I Ordered, and personally interpreted labs.  The pertinent results include: Baseline anemia, no leukocytosis, hypochloremia and mild hyponatremia with otherwise normal electrolytes, mild lactic acidosis.   Imaging Studies ordered:  I ordered imaging studies including chest x-ray, CTA chest, CT of abdomen and pelvis I independently visualized and interpreted imaging which showed consolidative airspace disease in right upper and middle lobe, complete collapse of right lower lobe.  Abdomen findings consistent with diarrheal illness. I agree with the radiologist interpretation   Cardiac  Monitoring: / EKG:  The patient was maintained on a cardiac monitor.  I personally viewed and interpreted the cardiac monitored which showed an underlying rhythm of: Sinus rhythm  Problem List / ED Course / Critical interventions / Medication management  Patient presenting for shortness of breath.  Found to be hypoxic on scene by EMS.  He arrives on nonrebreather.  Vital signs on  arrival are notable for tachycardia and blood pressures.  He was kept on nonrebreather with normal SpO2.  On assessment, patient is chronically ill-appearing.  He is severely cachectic.  He has a port NG tube in place.  He endorses pain around his G-tube.  Area of G-tube does not appear swollen or erythematous.  There is some mild tenderness to this area.  He does have increased work of breathing.  On lung auscultation, he appears to have diminished breath sounds on right side.  Chest x-ray shows worsening right-sided pneumonia.  Per review, when patient was seen in the ED 1 week ago, he was diagnosed with MRSA pneumonia.  He was prescribed Augmentin and azithromycin.  Per review of his nursing facility paperwork, it appears these medications were started 2 days ago, and not given today.  Patient was started on ceftriaxone and azithromycin.  He received IV fluids.  Lab work is notable for lactic acidosis.  Given his abdominal pain and tenderness, in addition to concern of severe pulmonary disease, patient underwent CTA of chest and CT of abdomen pelvis.  Findings show right-sided consolidative airspace opacities and collapse of right lower lung.  With some right medial lobe gas and fluid concerning for possible necrosis.  Vancomycin was added to antibiotics.  Blood pressures remain soft but stable.  He was able to be weaned down to 2 L of supplemental oxygen.  He did not require pressors.  He was admitted to medicine for further management. I ordered medication including IV fluids and broad-spectrum antibiotics for treatment of  sepsis; fentanyl for analgesia Reevaluation of the patient after these medicines showed that the patient improved I have reviewed the patients home medicines and have made adjustments as needed   Social Determinants of Health:  Resides in nursing facility  CRITICAL CARE Performed by: Godfrey Pick   Total critical care time: 35 minutes  Critical care time was exclusive of separately billable procedures and treating other patients.  Critical care was necessary to treat or prevent imminent or life-threatening deterioration.  Critical care was time spent personally by me on the following activities: development of treatment plan with patient and/or surrogate as well as nursing, discussions with consultants, evaluation of patient's response to treatment, examination of patient, obtaining history from patient or surrogate, ordering and performing treatments and interventions, ordering and review of laboratory studies, ordering and review of radiographic studies, pulse oximetry and re-evaluation of patient's condition.        Final Clinical Impression(s) / ED Diagnoses Final diagnoses:  Multifocal pneumonia  Acute on chronic respiratory failure with hypoxia [facility])    Rx / DC Orders ED Discharge Orders     None         Godfrey Pick, MD 05/28/22 1319

## 2022-05-29 ENCOUNTER — Encounter (HOSPITAL_COMMUNITY): Payer: Self-pay | Admitting: Family Medicine

## 2022-05-29 DIAGNOSIS — J9811 Atelectasis: Secondary | ICD-10-CM | POA: Diagnosis not present

## 2022-05-29 DIAGNOSIS — R627 Adult failure to thrive: Secondary | ICD-10-CM | POA: Diagnosis not present

## 2022-05-29 DIAGNOSIS — Z7189 Other specified counseling: Secondary | ICD-10-CM

## 2022-05-29 DIAGNOSIS — J690 Pneumonitis due to inhalation of food and vomit: Secondary | ICD-10-CM | POA: Diagnosis not present

## 2022-05-29 DIAGNOSIS — Z931 Gastrostomy status: Secondary | ICD-10-CM | POA: Diagnosis not present

## 2022-05-29 DIAGNOSIS — J189 Pneumonia, unspecified organism: Secondary | ICD-10-CM

## 2022-05-29 DIAGNOSIS — Z515 Encounter for palliative care: Secondary | ICD-10-CM | POA: Diagnosis not present

## 2022-05-29 DIAGNOSIS — J984 Other disorders of lung: Secondary | ICD-10-CM

## 2022-05-29 DIAGNOSIS — C159 Malignant neoplasm of esophagus, unspecified: Secondary | ICD-10-CM

## 2022-05-29 DIAGNOSIS — E43 Unspecified severe protein-calorie malnutrition: Secondary | ICD-10-CM

## 2022-05-29 DIAGNOSIS — R296 Repeated falls: Secondary | ICD-10-CM

## 2022-05-29 LAB — CBC
HCT: 26.9 % — ABNORMAL LOW (ref 39.0–52.0)
Hemoglobin: 9 g/dL — ABNORMAL LOW (ref 13.0–17.0)
MCH: 25.6 pg — ABNORMAL LOW (ref 26.0–34.0)
MCHC: 33.5 g/dL (ref 30.0–36.0)
MCV: 76.6 fL — ABNORMAL LOW (ref 80.0–100.0)
Platelets: 281 10*3/uL (ref 150–400)
RBC: 3.51 MIL/uL — ABNORMAL LOW (ref 4.22–5.81)
RDW: 14.6 % (ref 11.5–15.5)
WBC: 5.8 10*3/uL (ref 4.0–10.5)
nRBC: 0 % (ref 0.0–0.2)

## 2022-05-29 LAB — BASIC METABOLIC PANEL
Anion gap: 9 (ref 5–15)
BUN: 23 mg/dL — ABNORMAL HIGH (ref 6–20)
CO2: 29 mmol/L (ref 22–32)
Calcium: 8.6 mg/dL — ABNORMAL LOW (ref 8.9–10.3)
Chloride: 94 mmol/L — ABNORMAL LOW (ref 98–111)
Creatinine, Ser: 0.75 mg/dL (ref 0.61–1.24)
GFR, Estimated: >60 mL/min (ref >60)
Glucose, Bld: 78 mg/dL (ref 70–99)
Potassium: 3.4 mmol/L — ABNORMAL LOW (ref 3.5–5.1)
Sodium: 132 mmol/L — ABNORMAL LOW (ref 135–145)

## 2022-05-29 LAB — GLUCOSE, CAPILLARY
Glucose-Capillary: 80 mg/dL (ref 70–99)
Glucose-Capillary: 79 mg/dL (ref 70–99)
Glucose-Capillary: 97 mg/dL (ref 70–99)
Glucose-Capillary: 119 mg/dL — ABNORMAL HIGH (ref 70–99)
Glucose-Capillary: 170 mg/dL — ABNORMAL HIGH (ref 70–99)

## 2022-05-29 LAB — MAGNESIUM: Magnesium: 1.9 mg/dL (ref 1.7–2.4)

## 2022-05-29 LAB — PHOSPHORUS: Phosphorus: 2.8 mg/dL (ref 2.5–4.6)

## 2022-05-29 MED ORDER — FAMOTIDINE 20 MG PO TABS
20.0000 mg | ORAL_TABLET | Freq: Every day | ORAL | Status: DC
  Administered 2022-05-29 – 2022-06-05 (×8): 20 mg
  Filled 2022-05-29 (×8): qty 1

## 2022-05-29 MED ORDER — SODIUM CHLORIDE 3 % IN NEBU
4.0000 mL | INHALATION_SOLUTION | Freq: Two times a day (BID) | RESPIRATORY_TRACT | Status: AC
  Administered 2022-05-29 – 2022-06-01 (×6): 4 mL via RESPIRATORY_TRACT
  Filled 2022-05-29 (×6): qty 4

## 2022-05-29 NOTE — Progress Notes (Signed)
Pt has been advised not to eat any of his snacks such as chips, cookies and things that are not in his diet. Pt States " I will do what I want to". Pt is also refusing tube feedings at this time.

## 2022-05-29 NOTE — Progress Notes (Signed)
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  He is a resident of [facility] long-term care.  He was seen in the emergency department 1 week ago and diagnosed with a right-sided pneumonia but refused admission at that time.  He was discharged [place] on Augmentin which reportedly was started a couple of days ago at [facility].    Today he complained of shortness of breath and EMS was called found his SpO2 to be 76% on 3 L.  He was placed on a nonrebreather with improvement.  His ED workup reveals a worsening right-sided pneumonia with concern for abscess lung.  The CT also reveals some right medial lobe gas and fluid concerning for possible necrosis.  Patient was stabilized in the ED and admission was requested for further management.  Patient continues to insist that he be full code and wants to continue cancer treatment.  He is due for radiation treatment on 05/30/2022 and wants to be discharged prior to that time.   Assessment and Plan:  Aspiration Pneumonia  -pt reports that he has been aspirating his secretions -he is also likely aspirating his oral consumption as well -continue ampicillin/sulbactam -aspiration precautions -PT INSISTS ON ORAL EATING DESPITE RISKS -SLP evaluation  -Pt refusing care unless we give him a regular diet -He is high risk for more aspiration with regular diet -Pt insisting on regular  diet    Abscess Lung -continue IV ampicillin/sulbactam -consultation to pulmonology  -continue supportive measures    Stage IV B Squamous Cell Esophageal Cancer -pt continues to be treated at [facility]  -pt insists that he be discharged so he can get radiation treatment on 2/6 -ongoing goals of care need to be addressed -palliative care consulted, may need ethics consult    Chronic Hypotension  -continue midodrine 5 mg TID    Hyponatremia  -SIADH -IV fluids as ordered    Adult FTT -pt rapidly declining  -palliative consult requested -ethics may need to be involved   Sepsis due to pneumonia -IV fluid resuscitation ordered -aggressive IV antibiotics given -supportive measures -follow up cultures    Lactic acidosis - treated  -suspect this elevation is due to cancer and infection -IV fluid bolus ordered -recheck lactic acid after fluid boluses -lactate acid improved to 2.0      DVT prophylaxis: rivaroxaban (refuses injections) Code Status: full  Family Communication:  Disposition: Status is: Inpatient Remains inpatient appropriate because: intensity of illness   Consultants:  Pall pulmonary Procedures:   Antimicrobials:  Unasyn 2/4>>   Subjective: Pt refuses care unless we put him on regular diet.   Objective: Vitals:   05/29/22 0417 05/29/22 0500 05/29/22 0740 05/29/22 0757  BP: 93/73   95/76  Pulse:    (!) 107  Resp:    18  Temp:    98.7 F (37.1 C)  TempSrc:    Oral  SpO2:   97% 90%  Weight:  38.9 kg    Height:  Intake/Output Summary (Last 24 hours) at 05/29/2022 1229 Last data filed at 05/29/2022 0900 Gross per 24 hour  Intake 3416.28 ml  Output 225 ml  Net 3191.28 ml   Filed Weights   05/28/22 0837 05/29/22 0500  Weight: 39.9 kg 38.9 kg   Examination:  Constitutional: severely cachectic male; appears terminally ill; NAD;  Eyes: PERRL, lids and conjunctivae normal ENMT: Mucous membranes are pale and dry.  Posterior pharynx  clear of any exudate or lesions.   Neck: normal, supple, no masses, no thyromegaly Respiratory: shallow breath sounds bilateral.   Cardiovascular: normal s1, s2 sounds, no murmurs / rubs / gallops. No extremity edema. 2+ pedal pulses. No carotid bruits.  Abdomen: G tube site ok.  Mild tenderness, no masses palpated. No hepatosplenomegaly. Bowel sounds positive.  Musculoskeletal: no clubbing / cyanosis. No joint deformity upper and lower extremities. Good ROM, no contractures. Normal muscle tone.  Skin: no rashes, lesions, ulcers. No induration Neurologic: CN 2-12 grossly intact. Sensation intact, DTR depressed. Strength 5/5 in all 4.  Psychiatric: Poor judgment and insight. Alert and oriented x 3. angry mood.   Data Reviewed: I have personally reviewed following labs and imaging studies  CBC: Recent Labs  Lab 05/22/22 1612 05/28/22 0842 05/29/22 0528  WBC 8.8 4.7 5.8  NEUTROABS 7.3 2.9  --   HGB 12.2* 11.0* 9.0*  HCT 37.1* 33.6* 26.9*  MCV 77.5* 77.2* 76.6*  PLT 263 390 161    Basic Metabolic Panel: Recent Labs  Lab 05/22/22 1612 05/28/22 0842 05/29/22 0528  NA 128* 131* 132*  K 3.9 3.6 3.4*  CL 85* 89* 94*  CO2 '27 30 29  '$ GLUCOSE 50* 72 78  BUN 31* 29* 23*  CREATININE 1.12 0.79 0.75  CALCIUM 8.9 9.0 8.6*  MG  --  2.0 1.9  PHOS  --   --  2.8    CBG: Recent Labs  Lab 05/28/22 2008 05/28/22 2350 05/29/22 0421 05/29/22 0804 05/29/22 1156  GLUCAP 108* 93 80 79 97    Recent Results (from the past 240 hour(s))  Resp panel by RT-PCR (RSV, Flu A&B, Covid) Anterior Nasal Swab     Status: Abnormal   Collection Time: 05/22/22  4:22 PM   Specimen: Anterior Nasal Swab  Result Value Ref Range Status   SARS Coronavirus 2 by RT PCR POSITIVE (A) NEGATIVE Final    Comment: (NOTE) SARS-CoV-2 target nucleic acids are DETECTED.  The SARS-CoV-2 RNA is generally detectable in upper respiratory specimens during the acute phase of infection. Positive results are indicative of  the presence of the identified virus, but do not rule out bacterial infection or co-infection with other pathogens not detected by the test. Clinical correlation with patient history and other diagnostic information is necessary to determine patient infection status. The expected result is Negative.  Fact Sheet for Patients: EntrepreneurPulse.com.au  Fact Sheet for Healthcare Providers: IncredibleEmployment.be  This test is not yet approved or cleared by the [place] FDA and  has been authorized for detection and/or diagnosis of SARS-CoV-2 by FDA under an Emergency Use Authorization (EUA).  This EUA will remain in effect (meaning this test can be used) for the duration of  the COVID-19 declaration under Section 564(b)(1) of the A ct, 21 U.S.C. section 360bbb-3(b)(1), unless the authorization is terminated or revoked sooner.     Influenza A by PCR NEGATIVE NEGATIVE Final   Influenza B by PCR NEGATIVE NEGATIVE Final    Comment: (NOTE) The Xpert Xpress SARS-CoV-2/FLU/RSV plus assay is  intended as an aid in the diagnosis of influenza from Nasopharyngeal swab specimens and should not be used as a sole basis for treatment. Nasal washings and aspirates are unacceptable for Xpert Xpress SARS-CoV-2/FLU/RSV testing.  Fact Sheet for Patients: EntrepreneurPulse.com.au  Fact Sheet for Healthcare Providers: IncredibleEmployment.be  This test is not yet approved or cleared by the [place] FDA and has been authorized for detection and/or diagnosis of SARS-CoV-2 by FDA under an Emergency Use Authorization (EUA). This EUA will remain in effect (meaning this test can be used) for the duration of the COVID-19 declaration under Section 564(b)(1) of the Act, 21 U.S.C. section 360bbb-3(b)(1), unless the authorization is terminated or revoked.     Resp Syncytial Virus by PCR NEGATIVE NEGATIVE Final    Comment:  (NOTE) Fact Sheet for Patients: EntrepreneurPulse.com.au  Fact Sheet for Healthcare Providers: IncredibleEmployment.be  This test is not yet approved or cleared by the [place] FDA and has been authorized for detection and/or diagnosis of SARS-CoV-2 by FDA under an Emergency Use Authorization (EUA). This EUA will remain in effect (meaning this test can be used) for the duration of the COVID-19 declaration under Section 564(b)(1) of the Act, 21 U.S.C. section 360bbb-3(b)(1), unless the authorization is terminated or revoked.  Performed at [facility], [address] [place], [place] [address]   Blood culture (routine x 2)     Status: None (Preliminary result)   Collection Time: 05/28/22 10:36 AM   Specimen: BLOOD  Result Value Ref Range Status   Specimen Description BLOOD BLOOD LEFT ARM  Final   Special Requests NONE  Final   Culture   Final    NO GROWTH < 24 HOURS Performed at [facility], [address], [place], [place] [address]    Report Status PENDING  Incomplete  Blood culture (routine x 2)     Status: None (Preliminary result)   Collection Time: 05/28/22  3:20 PM   Specimen: BLOOD RIGHT ARM  Result Value Ref Range Status   Specimen Description   Final    BLOOD RIGHT ARM BOTTLES DRAWN AEROBIC AND ANAEROBIC   Special Requests Blood Culture adequate volume  Final   Culture   Final    NO GROWTH < 24 HOURS Performed at [facility], [address] [place],  [address]    Report Status PENDING  Incomplete     Radiology Studies: CT Angio Chest PE W and/or Wo Contrast  Result Date: 05/28/2022 CLINICAL DATA:  Shortness of breath.Abdominal pain. History of esophageal cancer. * Tracking Code: BO * EXAM: CT ANGIOGRAPHY CHEST CT ABDOMEN AND PELVIS WITH CONTRAST TECHNIQUE: Multidetector CT imaging of the chest was performed using the standard protocol during bolus administration of intravenous contrast. Multiplanar CT image  reconstructions and MIPs were obtained to evaluate the vascular anatomy. Multidetector CT imaging of the abdomen and pelvis was performed using the standard protocol during bolus administration of intravenous contrast. RADIATION DOSE REDUCTION: This exam was performed according to the departmental dose-optimization program which includes automated exposure control, adjustment of the mA and/or kV according to patient size and/or use of iterative reconstruction technique. CONTRAST:  131m OMNIPAQUE IOHEXOL 350 MG/ML SOLN COMPARISON:  01/31/2022 FINDINGS: CTA CHEST FINDINGS Cardiovascular: The heart size is normal. No substantial pericardial effusion. No thoracic aortic aneurysm. There is no filling defect in the opacified pulmonary arteries to suggest the presence of an acute pulmonary embolus. Left Port-A-Cath tip is positioned in the distal SVC. Mediastinum/Nodes: Diffusely increased attenuation of mediastinal fat may reflect  edema. No mediastinal lymphadenopathy. There is no hilar lymphadenopathy. Proximal esophagus is patulous with food debris in the proximal and mid segment compatible with the known neoplasm and similar to prior. There is no axillary lymphadenopathy. Lungs/Pleura: Circumferential bronchial wall thickening is noted in both lungs, right greater than left. There is extensive tree-in-bud nodularity in the right lung and left lower lobe with areas of consolidative airspace disease in the right upper lobe measuring 2.7 x 1.3 cm. Patchy confluent airspace disease is superimposed on the nodularity in the right middle lobe. There is complete collapse of the right lower lobe with obliteration of the bronchus intermedius. 2.0 x 1.6 cm collection of gas and fluid in the medial right lower lobe may represent an area of necrosis with fistula formation or abscess. Small right pleural effusion evident. Musculoskeletal: Multiple healed right rib fractures noted. There also multiple healed fractures on the left.  Review of the MIP images confirms the above findings. CT ABDOMEN and PELVIS FINDINGS Hepatobiliary: Tiny hypodensity in the medial segment left liver is stable in the interval, likely benign. Gallbladder not well visualized. No intrahepatic or extrahepatic biliary dilation. Pancreas: No focal mass lesion. No dilatation of the main duct. No intraparenchymal cyst. No peripancreatic edema. Spleen: No splenomegaly. No focal mass lesion. Adrenals/Urinary Tract: No adrenal nodule or mass. Tiny hypodensities in both kidneys are too small to characterize, similar to prior likely benign. No hydroureter. Bladder unremarkable. Stomach/Bowel: Stomach is nondistended. Gastrostomy tube evident. Scattered mildly distended fluid-filled small bowel loops in the abdomen and pelvis measuring up to 3.4 cm diameter. Neither the terminal ileum nor the appendix are discretely visible. Gas and fluid is scattered along the length of the mildly distended colon with gas and fluid visible in the rectum. This appearance can be seen in the setting of diarrhea. Vascular/Lymphatic: There is mild atherosclerotic calcification of the abdominal aorta without aneurysm. There is no gastrohepatic or hepatoduodenal ligament lymphadenopathy. No retroperitoneal or mesenteric lymphadenopathy. No pelvic sidewall lymphadenopathy. Assessment for lymphadenopathy is limited by patient cachexia and diffuse edema soft tissues. Reproductive: The prostate gland and seminal vesicles are unremarkable. Other: Probable small volume intraperitoneal free fluid. Diffuse edema of mesentery and extraperitoneal tissues. Musculoskeletal: Bone windows reveal no worrisome lytic or sclerotic osseous lesions. Mild wedge compression deformity is seen at T4 and T7, similar to prior. Diffuse body wall edema evident. Review of the MIP images confirms the above findings. IMPRESSION: 1. No CT evidence for acute pulmonary embolus. 2. Marked progression of extensive tree-in-bud nodularity  in both lungs, right greater than left, with areas of consolidative airspace disease in the right upper lobe and right middle lobe. Imaging features are compatible with an infectious/inflammatory etiology. Aspiration not excluded. 3. Complete collapse of the right lower lobe with obliteration of the bronchus intermedius. 2.0 x 1.6 cm collection of gas and fluid in the medial right lower lobe is progressive in the interval and may represent an area of necrosis with fistula formation or abscess. 4. Small right pleural effusion. 5. Scattered mildly distended fluid-filled small bowel loops in the abdomen and pelvis measuring up to 3.4 cm diameter. Gas and fluid is scattered along the length of the mildly distended colon with gas and fluid visible in the rectum. This appearance can be seen in the setting of diarrhea. Generally, imaging features suggest component of underlying dysmotility/ileus 6. Marked cachexia with diffuse edema of the mesentery, extraperitoneal fat, and body wall. 7. Patulous proximal and mid esophagus with debris in the lumen, compatible with  the patient's known esophageal neoplasm. Electronically Signed   By: Misty Stanley M.D.   On: 05/28/2022 12:49   CT ABDOMEN PELVIS W CONTRAST  Result Date: 05/28/2022 CLINICAL DATA:  Shortness of breath.Abdominal pain. History of esophageal cancer. * Tracking Code: BO * EXAM: CT ANGIOGRAPHY CHEST CT ABDOMEN AND PELVIS WITH CONTRAST TECHNIQUE: Multidetector CT imaging of the chest was performed using the standard protocol during bolus administration of intravenous contrast. Multiplanar CT image reconstructions and MIPs were obtained to evaluate the vascular anatomy. Multidetector CT imaging of the abdomen and pelvis was performed using the standard protocol during bolus administration of intravenous contrast. RADIATION DOSE REDUCTION: This exam was performed according to the departmental dose-optimization program which includes automated exposure control,  adjustment of the mA and/or kV according to patient size and/or use of iterative reconstruction technique. CONTRAST:  157m OMNIPAQUE IOHEXOL 350 MG/ML SOLN COMPARISON:  01/31/2022 FINDINGS: CTA CHEST FINDINGS Cardiovascular: The heart size is normal. No substantial pericardial effusion. No thoracic aortic aneurysm. There is no filling defect in the opacified pulmonary arteries to suggest the presence of an acute pulmonary embolus. Left Port-A-Cath tip is positioned in the distal SVC. Mediastinum/Nodes: Diffusely increased attenuation of mediastinal fat may reflect edema. No mediastinal lymphadenopathy. There is no hilar lymphadenopathy. Proximal esophagus is patulous with food debris in the proximal and mid segment compatible with the known neoplasm and similar to prior. There is no axillary lymphadenopathy. Lungs/Pleura: Circumferential bronchial wall thickening is noted in both lungs, right greater than left. There is extensive tree-in-bud nodularity in the right lung and left lower lobe with areas of consolidative airspace disease in the right upper lobe measuring 2.7 x 1.3 cm. Patchy confluent airspace disease is superimposed on the nodularity in the right middle lobe. There is complete collapse of the right lower lobe with obliteration of the bronchus intermedius. 2.0 x 1.6 cm collection of gas and fluid in the medial right lower lobe may represent an area of necrosis with fistula formation or abscess. Small right pleural effusion evident. Musculoskeletal: Multiple healed right rib fractures noted. There also multiple healed fractures on the left. Review of the MIP images confirms the above findings. CT ABDOMEN and PELVIS FINDINGS Hepatobiliary: Tiny hypodensity in the medial segment left liver is stable in the interval, likely benign. Gallbladder not well visualized. No intrahepatic or extrahepatic biliary dilation. Pancreas: No focal mass lesion. No dilatation of the main duct. No intraparenchymal cyst. No  peripancreatic edema. Spleen: No splenomegaly. No focal mass lesion. Adrenals/Urinary Tract: No adrenal nodule or mass. Tiny hypodensities in both kidneys are too small to characterize, similar to prior likely benign. No hydroureter. Bladder unremarkable. Stomach/Bowel: Stomach is nondistended. Gastrostomy tube evident. Scattered mildly distended fluid-filled small bowel loops in the abdomen and pelvis measuring up to 3.4 cm diameter. Neither the terminal ileum nor the appendix are discretely visible. Gas and fluid is scattered along the length of the mildly distended colon with gas and fluid visible in the rectum. This appearance can be seen in the setting of diarrhea. Vascular/Lymphatic: There is mild atherosclerotic calcification of the abdominal aorta without aneurysm. There is no gastrohepatic or hepatoduodenal ligament lymphadenopathy. No retroperitoneal or mesenteric lymphadenopathy. No pelvic sidewall lymphadenopathy. Assessment for lymphadenopathy is limited by patient cachexia and diffuse edema soft tissues. Reproductive: The prostate gland and seminal vesicles are unremarkable. Other: Probable small volume intraperitoneal free fluid. Diffuse edema of mesentery and extraperitoneal tissues. Musculoskeletal: Bone windows reveal no worrisome lytic or sclerotic osseous lesions. Mild wedge compression deformity  is seen at T4 and T7, similar to prior. Diffuse body wall edema evident. Review of the MIP images confirms the above findings. IMPRESSION: 1. No CT evidence for acute pulmonary embolus. 2. Marked progression of extensive tree-in-bud nodularity in both lungs, right greater than left, with areas of consolidative airspace disease in the right upper lobe and right middle lobe. Imaging features are compatible with an infectious/inflammatory etiology. Aspiration not excluded. 3. Complete collapse of the right lower lobe with obliteration of the bronchus intermedius. 2.0 x 1.6 cm collection of gas and fluid in  the medial right lower lobe is progressive in the interval and may represent an area of necrosis with fistula formation or abscess. 4. Small right pleural effusion. 5. Scattered mildly distended fluid-filled small bowel loops in the abdomen and pelvis measuring up to 3.4 cm diameter. Gas and fluid is scattered along the length of the mildly distended colon with gas and fluid visible in the rectum. This appearance can be seen in the setting of diarrhea. Generally, imaging features suggest component of underlying dysmotility/ileus 6. Marked cachexia with diffuse edema of the mesentery, extraperitoneal fat, and body wall. 7. Patulous proximal and mid esophagus with debris in the lumen, compatible with the patient's known esophageal neoplasm. Electronically Signed   By: Misty Stanley M.D.   On: 05/28/2022 12:49   DG Chest Portable 1 View  Result Date: 05/28/2022 CLINICAL DATA:  Shortness of breath. EXAM: PORTABLE CHEST 1 VIEW COMPARISON:  05/22/2022 FINDINGS: Lungs are hyperexpanded. Left lung clear. Increasing consolidative opacity at the right base with patchy airspace disease in the right mid and upper lung similar to prior. The cardio pericardial silhouette is enlarged. Left Port-A-Cath tip overlies the distal SVC. Telemetry leads overlie the chest. IMPRESSION: Increasing consolidative opacity at the right base with patchy airspace disease in the right mid and upper lung. Electronically Signed   By: Misty Stanley M.D.   On: 05/28/2022 09:05    Scheduled Meds:  allopurinol  300 mg Per Tube Daily   Chlorhexidine Gluconate Cloth  6 each Topical Daily   dextromethorphan  30 mg Oral BID   famotidine  20 mg Per Tube Daily   folic acid  1 mg Per Tube Daily   free water  200 mL Per Tube Q8H   gabapentin  300 mg Per Tube QHS   ipratropium-albuterol  3 mL Nebulization TID   magnesium oxide  400 mg Per Tube BID   megestrol  400 mg Per Tube BID   midodrine  5 mg Per Tube TID   multivitamin with minerals  1  tablet Per Tube Daily   mouth rinse  15 mL Mouth Rinse 4 times per day   rivaroxaban  10 mg Oral Q supper   Continuous Infusions:  sodium chloride 55 mL/hr at 05/29/22 1009   ampicillin-sulbactam (UNASYN) IV 1.5 g (05/29/22 1013)   feeding supplement (JEVITY 1.2 CAL)       LOS: 1 day   Time spent: 36 mins  Kien Mirsky Wynetta Emery, MD How to contact the [facility] Attending or Consulting provider [place] or covering provider during after hours 7P -7A, for this patient?  Check the care team in [facility] and look for a) attending/consulting [facility] provider listed and b) the [facility] team listed Log into [email or web address] and use [place]'s universal password to access. If you do not have the password, please contact the hospital operator. Locate the [facility] provider you are looking for under [facility] and page to a  number that you can be directly reached. If you still have difficulty reaching the provider, please page the [facility] (Director on Call) for the Hospitalists listed on amion for assistance.  05/29/2022, 12:29 PM

## 2022-05-29 NOTE — Progress Notes (Signed)
Patient still refusing tube feeding at this time. Patient requesting crackers. Made patient aware he can't eat those at this time due to his diet. Patient upset. Patient still requesting regular diet.

## 2022-05-29 NOTE — Progress Notes (Addendum)
Initial Nutrition Assessment  DOCUMENTATION CODES:      INTERVENTION:  Continuous Jevity 1.2@ 55 ml/hr per tube   If no IVF-Flush tube 200 ml TID  Dysphagia 2 diet with thin liquids  NUTRITION DIAGNOSIS:   Severe Malnutrition related to chronic illness (esophageal cancer) as evidenced by severe muscle depletion, severe fat depletion.  -patient has PEG tube available but refuses to use   GOAL:  Patient will meet greater than or equal to 90% of their needs  -Not met due to patient insistence of not allowing enteral feeding  MONITOR:  Labs, TF tolerance, Weight trends  REASON FOR ASSESSMENT:   Consult Enteral/tube feeding initiation and management  ASSESSMENT: Patient is an underweight 52 yo male presents from [facility] with PEG tube. History of aspiration pneumonia, squamous cell esophageal cancer, acute on chronic respiratory failure with hypoxia.   Patient is adamantly refusing tube feeding until his diet is changed to regular. Nursing has reached out to SLP. Orders in place when/if patient agrees to resume tube feeding.   Medications and weights reviewed. Currently pt weighs 38.9 kg (85.5 lb).      Latest Ref Rng & Units 05/29/2022    5:28 AM 05/28/2022    8:42 AM 05/22/2022    4:12 PM  BMP  Glucose 70 - 99 mg/dL 78  72  50   BUN 6 - 20 mg/dL '23  29  31   '$ Creatinine 0.61 - 1.24 mg/dL 0.75  0.79  1.12   Sodium 135 - 145 mmol/L 132  131  128   Potassium 3.5 - 5.1 mmol/L 3.4  3.6  3.9   Chloride 98 - 111 mmol/L 94  89  85   CO2 22 - 32 mmol/L '29  30  27   '$ Calcium 8.9 - 10.3 mg/dL 8.6  9.0  8.9       NUTRITION - FOCUSED PHYSICAL EXAM: Nutrition-Focused physical exam findings are severe fat depletion, severe muscle depletion, and no edema.     Diet Order:   Diet Order             DIET DYS 2 Room service appropriate? Yes; Fluid consistency: Thin  Diet effective now                   EDUCATION NEEDS:  Not appropriate for education at this  time  Skin:  Skin Assessment: Skin Integrity Issues: Skin Integrity Issues:: Stage I Stage I: sacurm  Last BM:  2/5  Height:   Ht Readings from Last 1 Encounters:  05/28/22 '5\' 6"'$  (1.676 m)    Weight:   Wt Readings from Last 1 Encounters:  05/29/22 38.9 kg    Ideal Body Weight:   59 kg  BMI:  Body mass index is 13.84 kg/m.  Estimated Nutritional Needs:   Kcal:  1600-1700  Protein:  80-85 gr  Fluid:  >1200 ml daily  Colman Cater MS,RD,CSG,LDN Contact: Shea Evans

## 2022-05-29 NOTE — NC FL2 (Signed)
[place] LEVEL OF CARE FORM     IDENTIFICATION  Patient Name: Trevor [name] Birthdate: April 22, 1971 Sex: male Admission Date (Current Location): 05/28/2022  [facility] and [place] Number:  [employer] and Address:  [place] [address], [place]      Provider Number: [id number]  Attending Physician Name and Address:  Murlean Iba, MD  Relative Name and Phone Number:       Current Level of Care: Hospital Recommended Level of Care: [place] Prior Approval Number:    Date Approved/Denied:   PASRR Number:    Discharge Plan: SNF    Current Diagnoses: Patient Active Problem List   Diagnosis Date Noted   Aspiration pneumonia ([place]) 05/28/2022   Sepsis due to pneumonia ([place]) 05/28/2022   SIADH (syndrome of inappropriate ADH production) ([place]) 05/28/2022   Malfunction of percutaneous endoscopic gastrostomy (PEG) tube ([place]) 03/08/2022   Hypernatremia 03/01/2022   Falls frequently 03/01/2022   Gait instability 03/01/2022   FTT (failure to thrive) in adult 02/15/2022   Hypotension 02/14/2022   AKI (acute kidney injury) ([place]) 80/99/8338   Metabolic acidosis 25/08/3974   Hypoglycemia 02/14/2022   Nausea & vomiting 02/14/2022   PEG (percutaneous endoscopic gastrostomy) status ([place]) 02/14/2022   Hyponatremia 01/23/2022   Pulmonary nodules/lesions, multiple    Cavitary pneumonia/?? 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 ([place]) 09/13/2021   Visual disturbance 08/18/2021   Dysphagia 08/18/2021   Vitamin D deficiency 05/31/2021   Hypomagnesemia 05/31/2021   Hypokalemia 05/30/2021   HTN (hypertension) 05/30/2021   Tachycardia, unspecified 05/30/2021   Gout 05/30/2021   GERD (gastroesophageal reflux disease) 05/30/2021    Alcohol abuse 05/30/2021   Tobacco abuse 05/30/2021    Orientation RESPIRATION BLADDER Height & Weight     Self, Time, Situation, Place  O2 (see dc summary) Incontinent Weight: 85 lb 12.1 oz (38.9 kg) Height:  '5\' 6"'$  (167.6 cm)  BEHAVIORAL SYMPTOMS/MOOD NEUROLOGICAL BOWEL NUTRITION STATUS      Incontinent Feeding tube, Diet (see dc summary)  AMBULATORY STATUS COMMUNICATION OF NEEDS Skin   Extensive Assist Verbally PU Stage and Appropriate Care ([place]) PU Stage 1 Dressing: Daily                     Personal Care Assistance Level of Assistance  Bathing, Feeding, Dressing Bathing Assistance: Limited assistance Feeding assistance: Limited assistance Dressing Assistance: Limited assistance     Functional Limitations Info  Sight, Hearing, Speech Sight Info: Impaired Hearing Info: Adequate Speech Info: Adequate    SPECIAL CARE FACTORS FREQUENCY                       Contractures Contractures Info: Not present    Additional Factors Info  Code Status, Allergies Code Status Info: Full Allergies Info: pork derived products           Current Medications (05/29/2022):  This is the current hospital active medication list Current Facility-Administered Medications  Medication Dose Route Frequency Provider Last Rate Last Admin   0.9 %  sodium chloride infusion   Intravenous Continuous Wynetta Emery, Clanford L, MD 55 mL/hr at 05/29/22 1009 New Bag at 05/29/22 1009   acetaminophen (TYLENOL) tablet 650 mg  650 mg Per Tube Q6H PRN Murlean Iba, MD  Or   acetaminophen (TYLENOL) suppository 650 mg  650 mg Rectal Q6H PRN Johnson, Clanford L, MD       albuterol (VENTOLIN HFA) 108 (90 Base) MCG/ACT inhaler 2 puff  2 puff Inhalation Q4H PRN Wynetta Emery, Clanford L, MD   2 puff at 05/28/22 2356   allopurinol (ZYLOPRIM) tablet 300 mg  300 mg Per Tube Daily Johnson, Clanford L, MD   300 mg at 05/29/22 1017   ampicillin-sulbactam (UNASYN) 1.5 g in sodium chloride 0.9 % 100 mL IVPB   1.5 g Intravenous Q6H Johnson, Clanford L, MD 200 mL/hr at 05/29/22 1013 1.5 g at 05/29/22 1013   Chlorhexidine Gluconate Cloth 2 % PADS 6 each  6 each Topical Daily Wynetta Emery, Clanford L, MD   6 each at 05/29/22 1033   dextromethorphan (DELSYM) 30 MG/5ML liquid 30 mg  30 mg Oral BID Johnson, Clanford L, MD   30 mg at 05/29/22 1030   famotidine (PEPCID) tablet 20 mg  20 mg Per Tube Daily Johnson, Clanford L, MD   20 mg at 05/29/22 1022   feeding supplement (JEVITY 1.2 CAL) liquid 1,000 mL  1,000 mL Per Tube Continuous Johnson, Clanford L, MD       fentaNYL (SUBLIMAZE) injection 12.5 mcg  12.5 mcg Intravenous Q2H PRN Wynetta Emery, Clanford L, MD   12.5 mcg at 16/10/96 0454   folic acid (FOLVITE) tablet 1 mg  1 mg Per Tube Daily Johnson, Clanford L, MD   1 mg at 05/29/22 1017   free water 200 mL  200 mL Per Tube Q8H Johnson, Clanford L, MD   200 mL at 05/28/22 1747   And   free water 30 mL  30 mL Per Tube PRN Johnson, Clanford L, MD       gabapentin (NEURONTIN) 250 MG/5ML solution 300 mg  300 mg Per Tube QHS Johnson, Clanford L, MD   300 mg at 05/28/22 2255   ipratropium-albuterol (DUONEB) 0.5-2.5 (3) MG/3ML nebulizer solution 3 mL  3 mL Nebulization TID Wynetta Emery, Clanford L, MD   3 mL at 05/29/22 0740   magnesium oxide (MAG-OX) tablet 400 mg  400 mg Per Tube BID Wynetta Emery, Clanford L, MD   400 mg at 05/29/22 1017   megestrol (MEGACE) 400 MG/10ML suspension 400 mg  400 mg Per Tube BID Wynetta Emery, Clanford L, MD   400 mg at 05/29/22 1024   midodrine (PROAMATINE) tablet 5 mg  5 mg Per Tube TID Wynetta Emery, Clanford L, MD   5 mg at 05/29/22 1017   multivitamin with minerals tablet 1 tablet  1 tablet Per Tube Daily Johnson, Clanford L, MD   1 tablet at 05/29/22 1018   ondansetron (ZOFRAN) tablet 4 mg  4 mg Oral Q6H PRN Johnson, Clanford L, MD       Or   ondansetron (ZOFRAN) injection 4 mg  4 mg Intravenous Q6H PRN Johnson, Clanford L, MD       Oral care mouth rinse  15 mL Mouth Rinse 4 times per day Wynetta Emery, Clanford L,  MD       Oral care mouth rinse  15 mL Mouth Rinse PRN Johnson, Clanford L, MD       oxyCODONE (Oxy IR/ROXICODONE) immediate release tablet 5 mg  5 mg Per Tube Q6H PRN Johnson, Clanford L, MD       rivaroxaban (XARELTO) tablet 10 mg  10 mg Oral Q supper Wynetta Emery, Clanford L, MD   10 mg at 05/28/22 1748   Facility-Administered Medications Ordered in  Other Encounters  Medication Dose Route Frequency Provider Last Rate Last Admin   palonosetron (ALOXI) 0.25 MG/5ML injection              Discharge Medications: Please see discharge summary for a list of discharge medications.  Relevant Imaging Results:  Relevant Lab Results:   Additional Information    Shade Flood, LCSW

## 2022-05-29 NOTE — Progress Notes (Signed)
05/29/2022 4:40 PM  Multiple telephone calls to brother today but no answer.  Did not leave message because I was not sure it was the correct number.    Murvin Natal, MD  How to contact the [facility] Attending or Consulting provider [place] or covering provider during after hours [place], for this patient?  Check the care team in [facility] and look for a) attending/consulting TRH provider listed and b) the [facility] team listed Log into [email or web address] and use [place]'s universal password to access. If you do not have the password, please contact the hospital operator. Locate the [facility] provider you are looking for under [facility] and page to a number that you can be directly reached. If you still have difficulty reaching the provider, please page the [facility] (Director on Call) for the Hospitalists listed on amion for assistance.

## 2022-05-29 NOTE — Consult Note (Signed)
Consultation Note Date: 05/29/2022   Patient Name: Trevor [name]  DOB: [birth year]  MRN: [id number]  Age / Sex: 52 y.o., male  PCP: Hal Morales, DO Referring Physician: Murlean Iba, MD  Reason for Consultation: Establishing goals of care  HPI/Patient Profile: 52 y.o. 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   Clinical Assessment and Goals of Care: I have reviewed medical records including EPIC notes, labs and imaging, received report from RN, assessed the patient.  Trevor [name] is lying quietly in bed.  He appears acutely/chronically ill and frail, cachectic.  He will make and somewhat keep eye contact.  He is alert and oriented x 3, able to make his needs known.  There is no family at bedside at this time.   We meet at the bedside to discuss diagnosis prognosis, GOC, EOL wishes, disposition and options.  I introduced Palliative Medicine as specialized medical care for people living with serious illness. It focuses on providing relief from the symptoms and stress of a serious illness. The goal is to improve quality of life for both the patient and the family.  Very limited meeting today.  Trevor [name] is on the telephone with his brother as I enter.  I shared that this is good that his brother is present so that we can talk about his goals.  Trevor [name] states to his brother that this NP is "anxious" and he tells me that he wants to disconnect his call from his brother before  speaking.  I shared that I understand he is to have an appointment for radiation therapy tomorrow.  Trevor [name] shares that he expects [place] to get him to his appointment.  I shared that people do not leave the hospital for radiation treatment and then return.  Trevor [name] becomes upset with this stating that "you brought me here" and that we must get him to his radiation appointment.  He shares that we are "messing with my health".  We talk about his tube feeding, that he declines administrations at times.  He goes further to state that, "you all are messing with my food".  He shares that the food is like "dog food", "chuck wagon".  He does not seem to be able to clarify how the tube feeding is affecting his by mouth diet.  We then focused on their current illness.  Again, very limited interview today.  At this point Trevor [name] expects full scope/full code, all and any treatment options as offered.  Trevor [name] becomes argumentative.  Advanced directives, concepts specific to code status, artifical feeding and hydration, and rehospitalization were not discussed today due  to Trevor [name]' demeanor.  Discussed the importance of continued conversation with family and the medical providers regarding overall plan of care and treatment options, ensuring decisions are within the context of the patient's values and GOCs.  Questions and concerns were addressed.  The patient was encouraged to call with questions or concerns.  PMT will continue to support holistically.  Conference with attending, bedside nursing staff, RD, transition of care team related to patient condition, needs, goals of care, disposition.   HCPOA NEXT OF KIN -I ask about healthcare power of attorney.  Trevor [name] states that he makes his own decisions.  He declines to name healthcare surrogate.  By law, his brother would be his healthcare surrogate as he tells me, "it is all me" when asked if he has a wife or children.  SUMMARY OF RECOMMENDATIONS    Continue full scope/full code Further chemo/radiation as offered Return to long-term care where he has been for approximately 2 months   Code Status/Advance Care Planning: Full code  Symptom Management:  Per hospitalist, no additional needs at this time.  Palliative Prophylaxis:  Frequent Pain Assessment and Oral Care  Additional Recommendations (Limitations, Scope, Preferences): Full Scope Treatment  Psycho-social/Spiritual:  Desire for further Chaplaincy support:no Additional Recommendations: Caregiving  Support/Resources and Education on Hospice  Prognosis:  Unable to determine, based on outcomes.  6 months or less would not be surprising based on cancer burden, chronic illness burden, frailty with cachexia BMI 13.8.  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 The following aspects are pertinent.  Past Medical History:  Diagnosis Date   Diabetes mellitus without complication ([place])    Esophageal cancer ([place])    Hypertension    Social History   Socioeconomic History   Marital status: Divorced    Spouse name: Not on file   Number of children: Not on file   Years of education: Not on file   Highest education level: Not on file  Occupational History   Not on file  Tobacco Use   Smoking status: Some Days    Packs/day: 0.50    Types: Cigarettes   Smokeless tobacco: Never  Vaping Use    Vaping Use: Never used  Substance and Sexual Activity   Alcohol use: Not Currently    Alcohol/week: 14.0 standard drinks of alcohol    Types: 14 Cans of beer per week    Comment: 2- 3 16 oz beer daily.   Drug use: Never   Sexual activity: Not on file  Other Topics Concern   Not on file  Social History Narrative   Not on file   Social Determinants of Health   Financial Resource Strain: High Risk (09/28/2021)   Overall Financial Resource Strain (CARDIA)    Difficulty of Paying Living Expenses: Hard  Food Insecurity: No Food Insecurity (05/28/2022)   Hunger Vital Sign    Worried About Running Out of Food in the Last Year: Never  true    Ran Out of Food in the Last Year: Never true  Transportation Needs: No Transportation Needs (05/28/2022)   PRAPARE - Hydrologist (Medical): No    Lack of Transportation (Non-Medical): No  Physical Activity: Not on file  Stress: No Stress Concern Present (02/07/2022)   [place]    Feeling of Stress : Only a little  Social Connections: Not on file   Family History  Problem Relation Age of Onset   Cancer Father        prostate   Cancer Sister    Colon cancer Neg Hx    Esophageal cancer Neg Hx    Scheduled Meds:  allopurinol  300 mg Per Tube Daily   Chlorhexidine Gluconate Cloth  6 each Topical Daily   dextromethorphan  30 mg Oral BID   famotidine  20 mg Per Tube Daily   folic acid  1 mg Per Tube Daily   free water  200 mL Per Tube Q8H   gabapentin  300 mg Per Tube QHS   ipratropium-albuterol  3 mL Nebulization TID   magnesium oxide  400 mg Per Tube BID   megestrol  400 mg Per Tube BID   midodrine  5 mg Per Tube TID   multivitamin with minerals  1 tablet Per Tube Daily   mouth rinse  15 mL Mouth Rinse 4 times per day   rivaroxaban  10 mg Oral Q supper   Continuous Infusions:  sodium chloride 55 mL/hr at 05/29/22 1009   ampicillin-sulbactam  (UNASYN) IV 1.5 g (05/29/22 1013)   feeding supplement (JEVITY 1.2 CAL)     PRN Meds:.acetaminophen **OR** acetaminophen, albuterol, fentaNYL (SUBLIMAZE) injection, free water **AND** free water, ondansetron **OR** ondansetron (ZOFRAN) IV, mouth rinse, oxyCODONE Medications Prior to Admission:  Prior to Admission medications   Medication Sig Start Date End Date Taking? Authorizing Provider  albuterol (VENTOLIN HFA) 108 (90 Base) MCG/ACT inhaler Inhale 2 puffs into the lungs every 4 (four) hours as needed for wheezing or shortness of breath.   Yes [provider]  allopurinol (ZYLOPRIM) 300 MG tablet Take 1 tablet (300 mg total) by mouth daily. Patient taking differently: Place 300 mg into feeding tube daily. 08/04/21  Yes Sanjuana Kava, MD  amoxicillin-clavulanate (AUGMENTIN) 875-125 MG tablet Take 1 tablet by mouth every 12 (twelve) hours. Patient taking differently: Place 1 tablet into feeding tube every 12 (twelve) hours. 7 day course. 05/22/22  Yes Davonna Belling, MD  ferrous sulfate 325 (65 FE) MG EC tablet Take 1 tablet (325 mg total) by mouth daily with breakfast. Patient taking differently: 325 mg See admin instructions. 325 mg once daily via tube 03/23/22  Yes Emokpae, Courage, MD  folic acid (FOLVITE) 1 MG tablet Place 1 tablet (1 mg total) into feeding tube daily. 03/13/22  Yes Tat, Shanon Brow, MD  gabapentin (NEURONTIN) 250 MG/5ML solution Place 6 mLs (300 mg total) into feeding tube at bedtime. Patient taking differently: Place 300 mg into feeding tube daily. 02/22/22  Yes Pokhrel, Laxman, MD  loperamide (IMODIUM) 2 MG capsule 4 mg See admin instructions. 4 mg via g-tube as needed for diarrhea after each loose stool. Maximum of 8 capsules per day.   Yes [provider]  magnesium oxide (MAG-OX) 400 (240 Mg) MG tablet Place 400 mg into feeding tube 2 (two) times daily.   Yes [provider]  megestrol (MEGACE) 400 MG/10ML suspension Take 10 mLs (  400 mg total) by  mouth 2 (two) times daily. Patient taking differently: Place 400 mg into feeding tube 2 (two) times daily. 12/07/21  Yes Derek Jack, MD  midodrine (PROAMATINE) 5 MG tablet Place 1 tablet (5 mg total) into feeding tube 3 (three) times daily with meals. Patient taking differently: Place 5 mg into feeding tube 3 (three) times daily. 03/12/22  Yes Tat, Shanon Brow, MD  Multiple Vitamins-Minerals (MULTIVITAMIN WITH MINERALS) tablet Take 1 tablet by mouth daily. Patient taking differently: Place 1 tablet into feeding tube daily. 03/23/22 03/23/23 Yes Emokpae, Courage, MD  nicotine (NICODERM CQ - DOSED IN MG/24 HOURS) 14 mg/24hr patch Place 1 patch (14 mg total) onto the skin daily as needed (nicotine craving). 03/12/22  Yes Tat, Shanon Brow, MD  Nutritional Supplements (ISOSOURCE 1.5 CAL) LIQD 1,000 mLs by Feeding Tube route See admin instructions. Isosource 1.5. Rate: 55 mLs/hour for 24 hours/day. Administer 1000 mLs daily.   Yes [provider]  omeprazole (KONVOMEP) 2 mg/mL SUSP oral suspension Place 20 mg into feeding tube daily.   Yes [provider]  ondansetron (ZOFRAN) 8 MG tablet Place 8 mg into feeding tube every 6 (six) hours as needed for nausea or vomiting.   Yes [provider]  oxyCODONE (OXY IR/ROXICODONE) 5 MG immediate release tablet Place 1 tablet (5 mg total) into feeding tube every 8 (eight) hours as needed for moderate pain. Patient taking differently: Place 5 mg into feeding tube every 8 (eight) hours as needed for moderate pain or severe pain. 03/23/22  Yes Emokpae, Courage, MD  potassium chloride SA (KLOR-CON M20) 20 MEQ tablet Take 1 tablet (20 mEq total) by mouth daily. Patient taking differently: 20 mEq daily. Per tube 03/27/22  Yes Emokpae, Courage, MD  Water For Irrigation, Sterile (FREE WATER) SOLN Place 200 mLs into feeding tube every 6 (six) hours. Patient taking differently: Place 30-200 mLs into feeding tube See admin instructions. 2 entries on  MAR: Flush feeding tube with a minimum of 30 mLs of water between each medication Flush feeding tube with 200 mLs of water every 8 hours 03/23/22  Yes Emokpae, Courage, MD  metoprolol tartrate (LOPRESSOR) 25 MG tablet Take 0.5 tablets (12.5 mg total) by mouth 2 (two) times daily. Patient not taking: Reported on 05/28/2022 03/23/22   Roxan Hockey, MD  Nutritional Supplements (FEEDING SUPPLEMENT, VITAL 1.5 CAL,) LIQD Place 1,000 mLs into feeding tube continuous. Vital 1.5@ 55 ml/hr per PEG tube - Patient not taking: Reported on 05/28/2022 03/23/22   Roxan Hockey, MD  pantoprazole (PROTONIX) 40 MG tablet Take 1 tablet (40 mg total) by mouth daily. Patient not taking: Reported on 05/28/2022 03/24/22   Roxan Hockey, MD  prochlorperazine (COMPAZINE) 10 MG tablet Take 1 tablet (10 mg total) by mouth every 6 (six) hours as needed for nausea or vomiting. Patient not taking: Reported on 05/28/2022 10/27/21   Derek Jack, MD   Allergies  Allergen Reactions   Pork-Derived Products Other (See Comments)    Patient does not eat pork due to his religious beliefs   Review of Systems  Unable to perform ROS: Other    Physical Exam Vitals and nursing note reviewed.  Constitutional:      Appearance: He is ill-appearing.  Cardiovascular:     Rate and Rhythm: Normal rate.  Pulmonary:     Effort: Pulmonary effort is normal. No tachypnea.  Skin:    General: Skin is warm and dry.  Neurological:     Mental Status: He is alert and  oriented to person, place, and time.  Psychiatric:        Mood and Affect: Mood is anxious.        Behavior: Behavior is agitated.     Vital Signs: BP 95/76 (BP Location: Left Arm)   Pulse (!) 107   Temp 98.7 F (37.1 C) (Oral)   Resp 18   Ht '5\' 6"'$  (1.676 m)   Wt 38.9 kg   SpO2 94%   BMI 13.84 kg/m  Pain Scale: 0-10   Pain Score: 3    SpO2: SpO2: 94 % O2 Device:SpO2: 94 % O2 Flow Rate: .O2 Flow Rate (L/min): 2 L/min  IO: Intake/output summary:   Intake/Output Summary (Last 24 hours) at 05/29/2022 1355 Last data filed at 05/29/2022 0900 Gross per 24 hour  Intake 4036.28 ml  Output 225 ml  Net 3811.28 ml    LBM: Last BM Date : 05/29/22 Baseline Weight: Weight: 39.9 kg Most recent weight: Weight: 38.9 kg     Palliative Assessment/Data:     Time In: 1030 Time Out: 1110 Time Total: 40 minutes  Greater than 50%  of this time was spent counseling and coordinating care related to the above assessment and plan.  Signed by: Drue Novel, NP   Please contact Palliative Medicine Team phone at [phone] for questions and concerns.  For individual provider: See Shea Evans

## 2022-05-29 NOTE — TOC Initial Note (Signed)
Transition of Care [facility]) - Initial/Assessment Note    Patient Details  Name: Trevor [name] MRN: [id number] Date of Birth: 20-May-1970  Transition of Care [facility]) CM/SW Contact:    Shade Flood, LCSW Phone Number: 05/29/2022, 12:44 PM  Clinical Narrative:                  Pt admitted from [facility] long term care. He has a high readmission risk score. Plan is for return to LTC at dc. Per MD, pt has a PEG tube and he is refusing to have tube feedings. He wants oral intake. Palliative consulted for [place].  Spoke with Debbie at [facility] to update.  TOC will follow.  Expected Discharge Plan: Long Term Nursing Home Barriers to Discharge: Continued Medical Work up   Patient Goals and CMS Choice Patient states their goals for this hospitalization and ongoing recovery are:: get better          Expected Discharge Plan and Services In-house Referral: Clinical Social Work     Living arrangements for the past 2 months: [place]                                      Prior Living Arrangements/Services Living arrangements for the past 2 months: [place] Lives with:: Facility Resident Patient language and need for interpreter reviewed:: Yes Do you feel safe going back to the place where you live?: Yes      Need for Family Participation in Patient Care: No (Comment) Care giver support system in place?: Yes (comment)   Criminal Activity/Legal Involvement Pertinent to Current Situation/Hospitalization: No - Comment as needed  Activities of Daily Living Home Assistive Devices/Equipment: Bedside commode/3-in-1, Wheelchair ADL Screening (condition at time of admission) Patient's cognitive ability adequate to safely complete daily activities?: Yes Is the patient deaf or have difficulty hearing?: No Does the patient have difficulty seeing, even when wearing glasses/contacts?: No Does the patient have difficulty concentrating, remembering, or  making decisions?: No Patient able to express need for assistance with ADLs?: Yes Does the patient have difficulty dressing or bathing?: Yes Independently performs ADLs?: Yes (appropriate for developmental age) Does the patient have difficulty walking or climbing stairs?: Yes Weakness of Legs: Both Weakness of Arms/Hands: None  Permission Sought/Granted                  Emotional Assessment       Orientation: : Oriented to Self, Oriented to Place, Oriented to  Time, Oriented to Situation Alcohol / Substance Use: Not Applicable Psych Involvement: No (comment)  Admission diagnosis:  Aspiration pneumonia ([place]) [J69.0] Squamous cell esophageal cancer ([place]) [C15.9] Acute on chronic respiratory failure with hypoxia (HCC) [J96.21] Multifocal pneumonia [J18.9] Patient Active Problem List   Diagnosis Date Noted   Aspiration pneumonia ([place]) 05/28/2022   Sepsis due to pneumonia ([place]) 05/28/2022   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: [phone]: [phone]    Social Determinants of Health (SDOH) Social History: SDOH Screenings   Food Insecurity: No Food Insecurity (05/28/2022)  Housing: Low Risk  (05/28/2022)  Transportation Needs: No Transportation Needs (05/28/2022)  Utilities: Not At Risk (05/28/2022)  Depression (PHQ2-9): Low Risk  (02/07/2022)  Financial Resource Strain: High Risk (09/28/2021)  Stress: No Stress Concern Present (02/07/2022)  Tobacco Use: High Risk (05/28/2022)   SDOH Interventions:     Readmission Risk Interventions    05/29/2022   12:43 PM 03/03/2022   11:46 AM 01/24/2022    3:13 PM  Readmission Risk Prevention Plan  Transportation Screening Complete Complete Complete  PCP or Specialist Appt within 3-5 Days   Complete  [place]  Complete  Social Work Consult for [place]sel[place]   Complete  Palliative Care Screening   Complete  Medication Review (Press photographer Complete Complete Complete  HRI or Home Care Consult Complete Complete   SW Recovery Care/Counseling Consult Complete Complete   Palliative Care Screening Complete Complete   Skilled Nursing Facility Complete Patient Refused

## 2022-05-29 NOTE — Consult Note (Signed)
NAME:  Trevor [name], MRN:  [id number], DOB:  09/21/1970, LOS: 1 ADMISSION DATE:  05/28/2022, CONSULTATION DATE:  05/29/2022  REFERRING MD:  Burnard Bunting, CHIEF COMPLAINT: Shortness of breath  History of Present Illness:  52 year old man, resident of [facility] brought in by EMS for respiratory distress, found to have saturation of 76% on 3 L oxygen He was seen in the ED 1 week ago and diagnosed with right lower lobe pneumonia but apparently refused admission and discharged AMA on Augmentin, this was started 2 days ago at [facility] CT angiogram chest/abdomen/pelvis showed right lower lobe atelectasis with cavitary nodule 2.0 cm in right lower lobe, tree-in-bud nodularity in both lungs with patchy consolidation, patulous proximal and midesophagus   Pertinent  Medical History  Esophageal cancer stage IV Status post PEG for dysphagia EtOH abuse  Significant Hospital Events: Including procedures, antibiotic start and stop dates in addition to other pertinent events     Interim History / Subjective:  Afebrile On 2 L oxygen Complains of loose stools  Objective   Blood pressure 95/76, pulse (!) 107, temperature 98.7 F (37.1 C), temperature source Oral, resp. rate 18, height '5\' 6"'$  (1.676 m), weight 38.9 kg, SpO2 94 %.        Intake/Output Summary (Last 24 hours) at 05/29/2022 1402 Last data filed at 05/29/2022 0900 Gross per 24 hour  Intake 4036.28 ml  Output 225 ml  Net 3811.28 ml   Filed Weights   05/28/22 0837 05/29/22 0500  Weight: 39.9 kg 38.9 kg    Examination: General: Cachectic, middle-aged man, upset and angry HENT: Mild pallor, no icterus, no JVD Lungs: Decreased breath sounds right base, no accessory muscle use Cardiovascular: S1-S2 mild tacky Abdomen: Scaphoid abdomen, PEG in place, nontender Extremities: No edema, no deformity Neuro: Alert and interactive, nonfocal  Labs show hyponatremia, hypokalemia, no leukocytosis, stable anemia   Resolved Hospital Problem list      Assessment & Plan:  Right lower lobe atelectasis with necrotizing pneumonia due to aspiration  -Agree with IV Unasyn, obtain sputum culture if possible -Not a candidate for bronchoscopy  -Tracheobronchial toilet with flutter valve on discharge, can use vest in the hospital if he will allow, saline nebs -Clearly he needs to comply with aspiration precautions otherwise he will continue to aspirate and may even end up intubated during 1 of these admissions  Esophageal cancer stage IV -currently [facility] Protein calorie malnutrition  -Palliative care requested by primary service.  Antibiotic associated diarrhea -consider C. difficile testing  PCCM will be available as needed  Best Practice (right click and "Reselect all SmartList Selections" daily)    Code Status:  full code Last date of multidisciplinary goals of care discussion [per TRH]  Labs   CBC: Recent Labs  Lab 05/22/22 1612 05/28/22 0842 05/29/22 0528  WBC 8.8 4.7 5.8  NEUTROABS 7.3 2.9  --   HGB 12.2* 11.0* 9.0*  HCT 37.1* 33.6* 26.9*  MCV 77.5* 77.2* 76.6*  PLT 263 390 774    Basic Metabolic Panel: Recent Labs  Lab 05/22/22 1612 05/28/22 0842 05/29/22 0528  NA 128* 131* 132*  K 3.9 3.6 3.4*  CL 85* 89* 94*  CO2 '27 30 29  '$ GLUCOSE 50* 72 78  BUN 31* 29* 23*  CREATININE 1.12 0.79 0.75  CALCIUM 8.9 9.0 8.6*  MG  --  2.0 1.9  PHOS  --   --  2.8   GFR: Estimated Creatinine Clearance: 58.1 mL/min (by C-G formula  based on SCr of 0.75 mg/dL). Recent Labs  Lab 05/22/22 1612 05/28/22 0842 05/28/22 1036 05/28/22 1520 05/28/22 2017 05/29/22 0528  WBC 8.8 4.7  --   --   --  5.8  LATICACIDVEN  --   --  2.5* 2.5* 2.0*  --     Liver Function Tests: Recent Labs  Lab 05/22/22 1612 05/28/22 0842  AST 31 23  ALT 9 11  ALKPHOS 65 91  BILITOT 1.1 0.8  PROT 6.9 6.5  ALBUMIN 2.5* 2.2*   No results for input(s): "LIPASE", "AMYLASE" in the last 168 hours. No  results for input(s): "AMMONIA" in the last 168 hours.  ABG    Component Value Date/Time   HCO3 36.4 (H) 05/28/2022 0842   TCO2 25 10/10/2021 0849   O2SAT 55.3 05/28/2022 0842     Coagulation Profile: No results for input(s): "INR", "PROTIME" in the last 168 hours.  Cardiac Enzymes: No results for input(s): "CKTOTAL", "CKMB", "CKMBINDEX", "TROPONINI" in the last 168 hours.  HbA1C: Hgb A1c MFr Bld  Date/Time Value Ref Range Status  08/18/2021 11:36 AM 5.2 4.8 - 5.6 % Final    Comment:             Prediabetes: 5.7 - 6.4          Diabetes: >6.4          Glycemic control for adults with diabetes: <7.0     CBG: Recent Labs  Lab 05/28/22 2008 05/28/22 2350 05/29/22 0421 05/29/22 0804 05/29/22 1156  GLUCAP 108* 93 80 79 97    Review of Systems:   Does not cooperate  Past Medical History:  He,  has a past medical history of Diabetes mellitus without complication ([place]), Esophageal cancer ([place]), and Hypertension.   Surgical History:   Past Surgical History:  Procedure Laterality Date   BIOPSY  08/26/2021   Procedure: BIOPSY;  Surgeon: Eloise Harman, DO;  [facility];  Service: Endoscopy;;   BRONCHIAL BIOPSY  01/23/2022   Procedure: BRONCHIAL BIOPSIES;  Surgeon: Collene Gobble, MD;  Location: [place];  Service: Pulmonary;;   BRONCHIAL BRUSHINGS  01/23/2022   Procedure: BRONCHIAL BRUSHINGS;  Surgeon: Collene Gobble, MD;  Location: [facility] ENDOSCOPY;  Service: Pulmonary;;   BRONCHIAL NEEDLE ASPIRATION BIOPSY  01/23/2022   Procedure: BRONCHIAL NEEDLE ASPIRATION BIOPSIES;  Surgeon: Collene Gobble, MD;  Location: [facility] ENDOSCOPY;  Service: Pulmonary;;   BRONCHIAL WASHINGS  01/23/2022   Procedure: BRONCHIAL WASHINGS;  Surgeon: Collene Gobble, MD;  [facility];  Service: Pulmonary;;   ESOPHAGOGASTRODUODENOSCOPY (EGD) WITH PROPOFOL N/A 08/26/2021   Procedure: ESOPHAGOGASTRODUODENOSCOPY (EGD) WITH PROPOFOL;  Surgeon: Eloise Harman, DO;  [facility];  Service: Endoscopy;  Laterality: N/A;  2:00pm   GASTROSTOMY N/A 10/12/2021   Procedure: OPEN GASTROSTOMY TUBE;  Surgeon: Aviva Signs, MD;  Location: [facility] ORS;  Service: General;  Laterality: N/A;   IR [place] GASTRO/COLONIC TUBE PERCUT W/FLUORO  03/10/2022   PORTACATH PLACEMENT Left 10/12/2021   Procedure: INSERTION PORT-A-CATH;  Surgeon: Aviva Signs, MD;  Location: [facility];  Service: General;  Laterality: Left;   VIDEO BRONCHOSCOPY WITH RADIAL ENDOBRONCHIAL ULTRASOUND  01/23/2022   Procedure: VIDEO BRONCHOSCOPY WITH RADIAL ENDOBRONCHIAL ULTRASOUND;  Surgeon: Collene Gobble, MD;  Location: [facility] ENDOSCOPY;  Service: Pulmonary;;     Social History:   reports that he has been smoking cigarettes. He has been smoking an average of .5 packs per day. He has never used smokeless tobacco. He reports  that he does not currently use alcohol after a past usage of about 14.0 standard drinks of alcohol per week. He reports that he does not use drugs.   Family History:  His family history includes Cancer in his father and sister. There is no history of Colon cancer or Esophageal cancer.   Allergies Allergies  Allergen Reactions   Pork-Derived Products Other (See Comments)    Patient does not eat pork due to his religious beliefs     Home Medications  Prior to Admission medications   Medication Sig Start Date End Date Taking? Authorizing Provider  albuterol (VENTOLIN HFA) 108 (90 Base) MCG/ACT inhaler Inhale 2 puffs into the lungs every 4 (four) hours as needed for wheezing or shortness of breath.   Yes [provider]  allopurinol (ZYLOPRIM) 300 MG tablet Take 1 tablet (300 mg total) by mouth daily. Patient taking differently: Place 300 mg into feeding tube daily. 08/04/21  Yes Sanjuana Kava, MD  amoxicillin-clavulanate (AUGMENTIN) 875-125 MG tablet Take 1 tablet by mouth every 12 (twelve) hours. Patient taking differently: Place 1 tablet into feeding tube every 12 (twelve) hours. 7 day  course. 05/22/22  Yes Davonna Belling, MD  ferrous sulfate 325 (65 FE) MG EC tablet Take 1 tablet (325 mg total) by mouth daily with breakfast. Patient taking differently: 325 mg See admin instructions. 325 mg once daily via tube 03/23/22  Yes Emokpae, Courage, MD  folic acid (FOLVITE) 1 MG tablet Place 1 tablet (1 mg total) into feeding tube daily. 03/13/22  Yes Tat, Shanon Brow, MD  gabapentin (NEURONTIN) 250 MG/5ML solution Place 6 mLs (300 mg total) into feeding tube at bedtime. Patient taking differently: Place 300 mg into feeding tube daily. 02/22/22  Yes Pokhrel, Laxman, MD  loperamide (IMODIUM) 2 MG capsule 4 mg See admin instructions. 4 mg via g-tube as needed for diarrhea after each loose stool. Maximum of 8 capsules per day.   Yes [provider]  magnesium oxide (MAG-OX) 400 (240 Mg) MG tablet Place 400 mg into feeding tube 2 (two) times daily.   Yes [provider]  megestrol (MEGACE) 400 MG/10ML suspension Take 10 mLs (400 mg total) by mouth 2 (two) times daily. Patient taking differently: Place 400 mg into feeding tube 2 (two) times daily. 12/07/21  Yes Derek Jack, MD  midodrine (PROAMATINE) 5 MG tablet Place 1 tablet (5 mg total) into feeding tube 3 (three) times daily with meals. Patient taking differently: Place 5 mg into feeding tube 3 (three) times daily. 03/12/22  Yes Tat, Shanon Brow, MD  Multiple Vitamins-Minerals (MULTIVITAMIN WITH MINERALS) tablet Take 1 tablet by mouth daily. Patient taking differently: Place 1 tablet into feeding tube daily. 03/23/22 03/23/23 Yes Emokpae, Courage, MD  nicotine (NICODERM CQ - DOSED IN MG/24 HOURS) 14 mg/24hr patch Place 1 patch (14 mg total) onto the skin daily as needed (nicotine craving). 03/12/22  Yes Tat, Shanon Brow, MD  Nutritional Supplements (ISOSOURCE 1.5 CAL) LIQD 1,000 mLs by Feeding Tube route See admin instructions. Isosource 1.5. Rate: 55 mLs/hour for 24 hours/day. Administer 1000 mLs daily.   Yes [provider]  omeprazole (KONVOMEP) 2 mg/mL SUSP oral suspension Place 20 mg into feeding tube daily.   Yes [provider]  ondansetron (ZOFRAN) 8 MG tablet Place 8 mg into feeding tube every 6 (six) hours as needed for nausea or vomiting.   Yes [provider]  oxyCODONE (OXY IR/ROXICODONE) 5 MG immediate release tablet Place 1 tablet (5 mg total) into feeding tube  every 8 (eight) hours as needed for moderate pain. Patient taking differently: Place 5 mg into feeding tube every 8 (eight) hours as needed for moderate pain or severe pain. 03/23/22  Yes Emokpae, Courage, MD  potassium chloride SA (KLOR-CON M20) 20 MEQ tablet Take 1 tablet (20 mEq total) by mouth daily. Patient taking differently: 20 mEq daily. Per tube 03/27/22  Yes Emokpae, Courage, MD  Water For Irrigation, Sterile (FREE WATER) SOLN Place 200 mLs into feeding tube every 6 (six) hours. Patient taking differently: Place 30-200 mLs into feeding tube See admin instructions. 2 entries on MAR: Flush feeding tube with a minimum of 30 mLs of water between each medication Flush feeding tube with 200 mLs of water every 8 hours 03/23/22  Yes Emokpae, Courage, MD  metoprolol tartrate (LOPRESSOR) 25 MG tablet Take 0.5 tablets (12.5 mg total) by mouth 2 (two) times daily. Patient not taking: Reported on 05/28/2022 03/23/22   Roxan Hockey, MD  Nutritional Supplements (FEEDING SUPPLEMENT, VITAL 1.5 CAL,) LIQD Place 1,000 mLs into feeding tube continuous. Vital 1.5@ 55 ml/hr per PEG tube - Patient not taking: Reported on 05/28/2022 03/23/22   Roxan Hockey, MD  pantoprazole (PROTONIX) 40 MG tablet Take 1 tablet (40 mg total) by mouth daily. Patient not taking: Reported on 05/28/2022 03/24/22   Roxan Hockey, MD  prochlorperazine (COMPAZINE) 10 MG tablet Take 1 tablet (10 mg total) by mouth every 6 (six) hours as needed for nausea or vomiting. Patient not taking: Reported on 05/28/2022 10/27/21   Derek Jack, MD      Kara Mead  MD. FCCP. [facility] Pager : [phone]  If no response to pager , please call [phone] until 7 pm After 7:00 pm call [place]ink  [phone]   05/29/2022

## 2022-05-29 NOTE — Evaluation (Addendum)
Clinical/Bedside Swallow Evaluation Patient Details  Name: Trevor [name] MRN: [id number] Date of Birth: Sep 12, 1970  Today's Date: 05/29/2022 Time: SLP Start Time (ACUTE ONLY): 38 SLP Stop Time (ACUTE ONLY): 1732 SLP Time Calculation (min) (ACUTE ONLY): 31 min  Past Medical History:  Past Medical History:  Diagnosis Date   Diabetes mellitus without complication ([place])    Esophageal cancer ([place])    Hypertension    Past Surgical History:  Past Surgical History:  Procedure Laterality Date   BIOPSY  08/26/2021   Procedure: BIOPSY;  Surgeon: Eloise Harman, DO;  [facility];  Service: Endoscopy;;   BRONCHIAL BIOPSY  01/23/2022   Procedure: BRONCHIAL BIOPSIES;  Surgeon: Collene Gobble, MD;  Location: [facility] ENDOSCOPY;  Service: Pulmonary;;   BRONCHIAL BRUSHINGS  01/23/2022   Procedure: BRONCHIAL BRUSHINGS;  Surgeon: Collene Gobble, MD;  Location: [facility] ENDOSCOPY;  Service: Pulmonary;;   BRONCHIAL NEEDLE ASPIRATION BIOPSY  01/23/2022   Procedure: BRONCHIAL NEEDLE ASPIRATION BIOPSIES;  Surgeon: Collene Gobble, MD;  Location: [facility] ENDOSCOPY;  Service: Pulmonary;;   BRONCHIAL WASHINGS  01/23/2022   Procedure: BRONCHIAL WASHINGS;  Surgeon: Collene Gobble, MD;  Location: [facility] ENDOSCOPY;  Service: Pulmonary;;   ESOPHAGOGASTRODUODENOSCOPY (EGD) WITH PROPOFOL N/A 08/26/2021   Procedure: ESOPHAGOGASTRODUODENOSCOPY (EGD) WITH PROPOFOL;  Surgeon: Eloise Harman, DO;  [facility];  Service: Endoscopy;  Laterality: N/A;  2:00pm   GASTROSTOMY N/A 10/12/2021   Procedure: OPEN GASTROSTOMY TUBE;  Surgeon: Aviva Signs, MD;  Location: [facility] ORS;  Service: General;  Laterality: N/A;   IR [place] GASTRO/COLONIC TUBE PERCUT W/FLUORO  03/10/2022   PORTACATH PLACEMENT Left 10/12/2021   Procedure: INSERTION PORT-A-CATH;  Surgeon: Aviva Signs, MD;  Location: [facility] ORS;  Service: General;  Laterality: Left;   VIDEO BRONCHOSCOPY WITH RADIAL ENDOBRONCHIAL ULTRASOUND  01/23/2022   Procedure: VIDEO BRONCHOSCOPY WITH  RADIAL ENDOBRONCHIAL ULTRASOUND;  Surgeon: Collene Gobble, MD;  Location: [place] ENDOSCOPY;  Service: Pulmonary;;   HPI:  Trevor [name] is a 52 year old gentleman longtime smoker diagnosed in 2023 with 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  He is a resident of [facility] long-term care.  He was seen in the emergency department 1 week ago and diagnosed with a right-sided pneumonia but refused admission at that time.  He was discharged [place] on Augmentin which reportedly was started a couple of days ago at [facility].       Today he complained of shortness of breath and EMS was called found his SpO2 to be 76% on 3 L.  He was placed on a nonrebreather with improvement.     His ED workup reveals a worsening right-sided pneumonia with concern for abscess lung.  The CT also reveals some right medial lobe gas and fluid concerning for possible necrosis.  Patient was stabilized in the ED and admission was requested for further management.  Patient continues to insist that he be full code and wants to continue cancer treatment.  He is due for radiation treatment on 05/30/2022 and wants to be discharged prior to that time.    Assessment / Plan / Recommendation  Clinical Impression  Clinical swallow evaluation completed at bedside. Pt reports that he typically eats regular textures, but picks and chooses what he eats. Oral motor  exam reveals sparse/poor dentition, otherwise WNL. He self presented water, soda, puree, and regular textures. Pt with occasional delayed cough and expectoration of mucous and saliva. He does not present with over signs of prandial aspiration, however given his diagnosis of esophageal cancer, he  could be experiencing regurgitation and aspiration due to suspected poor esophageal clearance. Chart reviewed and his cancer was diagnosed in May 2024 and he underwent PEG placement a few weeks later for supplemental nutrition. Imaging for the past several months, has shown likely PNA. Pt has also been insistent on eating by mouth throughout his diagnosis. He tells SLP that he can pick and choose what he eats and knows what he can swallow. He also agrees to use the PEG tube again if he is allowed to eat regular textures. Pt is now on D2. Pt tells SLP that the cancer is in his throat, however it is in his esophagus. He stated that he didn't know where his esophagus was and SLP explained anatomy to him. He was encouraged to take small bites/sips and eat several small meals throughout the day if he chooses to eat by mouth. He indicates that he already does this. Consider completing a Barium swallow/esophagram to provide imaging of his esophagus to see how much is moving through to his stomach. Pt has expressed desire to eat by mouth.   Recommend advance textures to regular textures from an oropharyngeal standpoint and per his repeated discussions with other physicians and allow Pt to self regulate textures and PO intake. A barium swallow (esophageal) would be more beneficial than a modfiied barium (orpharyngeal) if looking to provide additional information to Pt, however suspect he will continue to eat/drink by mouth. Will defer diet advancement to physician and possible completion of esophagram to provided additional information. SLP will sign off. Reconsult PRN.   SLP Visit Diagnosis: Dysphagia, pharyngoesophageal phase (R13.14)    Aspiration Risk  Mild aspiration risk;Risk for inadequate nutrition/hydration    Diet Recommendation Regular;Thin liquid   Liquid Administration via: Cup;Straw Medication Administration: Via alternative means Supervision: Patient able to self feed;Intermittent supervision to  cue for compensatory strategies Compensations: Slow rate;Small sips/bites Postural Changes: Seated upright at 90 degrees;Remain upright for at least 30 minutes after po intake    Other  Recommendations Recommended Consults: Consider esophageal assessment Oral Care Recommendations: Oral care BID;Staff/trained caregiver to provide oral care Other Recommendations: Clarify dietary restrictions    Recommendations for follow up therapy are one component of a multi-disciplinary discharge planning process, led by the attending physician.  Recommendations may be updated based on patient status, additional functional criteria and insurance authorization.  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  He is a resident of [facility] long-term care.  He was  seen in the emergency department 1 week ago and diagnosed with a right-sided pneumonia but refused admission at that time.  He was discharged [place] on Augmentin which reportedly was started a couple of days ago at [facility].       Today he complained of shortness of breath and EMS was called found his SpO2 to be 76% on 3 L.  He was placed on a nonrebreather with improvement.      His ED workup reveals a worsening right-sided pneumonia with concern for abscess lung.  The CT also reveals some right medial lobe gas and fluid concerning for possible necrosis.  Patient was stabilized in the ED and admission was requested for further management.  Patient continues to insist that he be full code and wants to continue cancer treatment.  He is due for radiation treatment on 05/30/2022 and wants to be discharged prior to that time. Type of Study: Bedside Swallow Evaluation Diet Prior to this Study: Dysphagia 2 (chopped);Thin liquids Temperature Spikes Noted: No Respiratory Status: Nasal cannula History of Recent Intubation: No Behavior/Cognition: Alert;Cooperative;Pleasant mood Oral Cavity Assessment: Within Functional Limits Oral Care Completed by SLP: Recent completion by staff Oral Cavity - Dentition: Poor condition;Missing dentition Vision: Functional for self-feeding Self-Feeding Abilities: Able to feed self Patient Positioning: Upright in bed Baseline Vocal Quality: Normal Volitional Cough: Strong;Congested Volitional Swallow: Able to elicit    Oral/Motor/Sensory Function Overall Oral Motor/Sensory Function: Within functional limits   Ice Chips Ice chips: Within functional limits Presentation: Spoon   Thin Liquid Thin Liquid: Within functional limits Presentation: Cup;Straw;Self Fed Other Comments:  (Pt with delayed cough, expectorates mucous frequently)    Nectar Thick Nectar Thick Liquid: Not tested   Honey Thick Honey Thick Liquid: Not tested   Puree Puree: Within functional limits Presentation: Spoon;Self Fed   Solid     Solid: Within functional limits Presentation: Self Fed     Thank you,  Genene Churn, [place]  Franchon Ketterman 05/29/2022,5:46 PM

## 2022-05-30 DIAGNOSIS — Z931 Gastrostomy status: Secondary | ICD-10-CM | POA: Diagnosis not present

## 2022-05-30 DIAGNOSIS — J189 Pneumonia, unspecified organism: Secondary | ICD-10-CM | POA: Diagnosis not present

## 2022-05-30 DIAGNOSIS — J690 Pneumonitis due to inhalation of food and vomit: Secondary | ICD-10-CM | POA: Diagnosis not present

## 2022-05-30 DIAGNOSIS — R627 Adult failure to thrive: Secondary | ICD-10-CM | POA: Diagnosis not present

## 2022-05-30 LAB — BASIC METABOLIC PANEL
Anion gap: 8 (ref 5–15)
BUN: 17 mg/dL (ref 6–20)
CO2: 27 mmol/L (ref 22–32)
Calcium: 8.6 mg/dL — ABNORMAL LOW (ref 8.9–10.3)
Chloride: 101 mmol/L (ref 98–111)
Creatinine, Ser: 0.67 mg/dL (ref 0.61–1.24)
GFR, Estimated: >60 mL/min (ref >60)
Glucose, Bld: 91 mg/dL (ref 70–99)
Potassium: 2.9 mmol/L — ABNORMAL LOW (ref 3.5–5.1)
Sodium: 136 mmol/L (ref 135–145)

## 2022-05-30 LAB — GLUCOSE, CAPILLARY
Glucose-Capillary: 102 mg/dL — ABNORMAL HIGH (ref 70–99)
Glucose-Capillary: 104 mg/dL — ABNORMAL HIGH (ref 70–99)
Glucose-Capillary: 82 mg/dL (ref 70–99)
Glucose-Capillary: 87 mg/dL (ref 70–99)
Glucose-Capillary: 88 mg/dL (ref 70–99)
Glucose-Capillary: 89 mg/dL (ref 70–99)
Glucose-Capillary: 92 mg/dL (ref 70–99)

## 2022-05-30 LAB — CBC
HCT: 29.1 % — ABNORMAL LOW (ref 39.0–52.0)
Hemoglobin: 9.3 g/dL — ABNORMAL LOW (ref 13.0–17.0)
MCH: 24.8 pg — ABNORMAL LOW (ref 26.0–34.0)
MCHC: 32 g/dL (ref 30.0–36.0)
MCV: 77.6 fL — ABNORMAL LOW (ref 80.0–100.0)
Platelets: 308 10*3/uL (ref 150–400)
RBC: 3.75 MIL/uL — ABNORMAL LOW (ref 4.22–5.81)
RDW: 14.7 % (ref 11.5–15.5)
WBC: 6.8 10*3/uL (ref 4.0–10.5)
nRBC: 0 % (ref 0.0–0.2)

## 2022-05-30 LAB — MAGNESIUM: Magnesium: 1.8 mg/dL (ref 1.7–2.4)

## 2022-05-30 LAB — PHOSPHORUS: Phosphorus: 2.4 mg/dL — ABNORMAL LOW (ref 2.5–4.6)

## 2022-05-30 MED ORDER — POTASSIUM CHLORIDE 10 MEQ/100ML IV SOLN
10.0000 meq | INTRAVENOUS | Status: AC
  Administered 2022-05-30 (×3): 10 meq via INTRAVENOUS
  Filled 2022-05-30 (×3): qty 100

## 2022-05-30 MED ORDER — MAGNESIUM SULFATE 2 GM/50ML IV SOLN
2.0000 g | Freq: Once | INTRAVENOUS | Status: AC
  Administered 2022-05-30: 2 g via INTRAVENOUS
  Filled 2022-05-30: qty 50

## 2022-05-30 NOTE — Progress Notes (Signed)
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  He is a resident of [facility] long-term care.  He was seen in the emergency department 1 week ago and diagnosed with a right-sided pneumonia but refused admission at that time.  He was discharged [place] on Augmentin which reportedly was started a couple of days ago at [facility].    Today he complained of shortness of breath and EMS was called found his SpO2 to be 76% on 3 L.  He was placed on a nonrebreather with improvement.  His ED workup reveals a worsening right-sided pneumonia with concern for abscess lung.  The CT also reveals some right medial lobe gas and fluid concerning for possible necrosis.  Patient was stabilized in the ED and admission was requested for further management.  Patient continues to insist that he be full code and wants to continue cancer treatment.  He is due for radiation treatment on 05/30/2022 and wants to be discharged prior to that time.   Assessment and Plan:  Aspiration Pneumonia  -pt reports that he has been aspirating his secretions  -he is also likely aspirating his oral consumption as well -continue ampicillin/sulbactam -aspiration precautions -PT INSISTS ON ORAL EATING DESPITE RISKS -SLP evaluation  -Pt refusing care unless we give him a regular diet -He is high risk for more aspiration with regular diet -Pt insisting on  regular diet only and verbalized understanding after being counseled on risks of recurrent aspiration, intubation, death.   Abscess Lung -continue IV ampicillin/sulbactam -consultation to pulmonology  -continue supportive measures    Stage IV B Squamous Cell Esophageal Cancer -pt continues to be treated at [facility]  -pt was set to get radiation treatment on 2/6 but will need to be rescheduled d/t hospitalization -ongoing goals of care need to be addressed, pt remains in denial and anger -palliative care consulted, may need ethics consult    Chronic Hypotension  -continue midodrine 5 mg TID    Hyponatremia  -SIADH -IV fluids as ordered    Adult FTT -pt rapidly declining due to multiple factors -palliative consult requested -ethics may need to be involved   Sepsis due to pneumonia -IV fluid resuscitation ordered -aggressive IV antibiotics given -supportive measures -follow up cultures    Lactic acidosis - treated  -suspect this elevation is due to cancer and infection -IV fluid bolus ordered -recheck lactic acid after fluid boluses -lactate acid improved to 2.0    DVT prophylaxis: rivaroxaban (refuses injections) Code Status: full  Family Communication:  Disposition: Status is: Inpatient Remains inpatient appropriate because: intensity of illness   Consultants:  Pall pulmonary Procedures:   Antimicrobials:  Unasyn 2/4>>   Subjective: Pt refuses care or cooperation unless we put him on regular diet.   Objective: Vitals:   05/29/22 2046 05/30/22 0433 05/30/22 0823 05/30/22 1329  BP:  105/80    Pulse:  93 91 95  Resp:  '16 16 16  '$ Temp:  98.4 F (36.9 C)  TempSrc:  Oral    SpO2: 99% 100% 98%   Weight:  38.5 kg    Height:        Intake/Output Summary (Last 24 hours) at 05/30/2022 1633 Last data filed at 05/30/2022 0433 Gross per 24 hour  Intake --  Output 200 ml  Net -200 ml   Filed Weights   05/28/22 0837 05/29/22 0500 05/30/22 0433  Weight:  39.9 kg 38.9 kg 38.5 kg   Examination:  Constitutional: severely cachectic male; appears terminally ill; NAD;  Eyes: PERRL, lids and conjunctivae normal ENMT: Mucous membranes are pale and dry.  Posterior pharynx clear of any exudate or lesions.   Neck: normal, supple, no masses, no thyromegaly Respiratory: shallow breath sounds bilateral.   Cardiovascular: normal s1, s2 sounds, no murmurs / rubs / gallops. No extremity edema. 2+ pedal pulses. No carotid bruits.  Abdomen: G tube site ok.  Mild tenderness, no masses palpated. No hepatosplenomegaly. Bowel sounds positive.  Musculoskeletal: no clubbing / cyanosis. No joint deformity upper and lower extremities. Good ROM, no contractures. Normal muscle tone.  Skin: no rashes, lesions, ulcers. No induration Neurologic: CN 2-12 grossly intact. Sensation intact, DTR depressed. Strength 5/5 in all 4.  Psychiatric: Poor judgment and insight. Alert and oriented x 3. angry mood.   Data Reviewed: I have personally reviewed following labs and imaging studies  CBC: Recent Labs  Lab 05/28/22 0842 05/29/22 0528 05/30/22 0459  WBC 4.7 5.8 6.8  NEUTROABS 2.9  --   --   HGB 11.0* 9.0* 9.3*  HCT 33.6* 26.9* 29.1*  MCV 77.2* 76.6* 77.6*  PLT 390 281 638    Basic Metabolic Panel: Recent Labs  Lab 05/28/22 0842 05/29/22 0528 05/30/22 0459  NA 131* 132* 136  K 3.6 3.4* 2.9*  CL 89* 94* 101  CO2 '30 29 27  '$ GLUCOSE 72 78 91  BUN 29* 23* 17  CREATININE 0.79 0.75 0.67  CALCIUM 9.0 8.6* 8.6*  MG 2.0 1.9 1.8  PHOS  --  2.8 2.4*    CBG: Recent Labs  Lab 05/29/22 1959 05/30/22 0008 05/30/22 0429 05/30/22 0759 05/30/22 1233  GLUCAP 170* 104* 92 88 87    Recent Results (from the past 240 hour(s))  Resp panel by RT-PCR (RSV, Flu A&B, Covid) Anterior Nasal Swab     Status: Abnormal   Collection Time: 05/22/22  4:22 PM   Specimen: Anterior Nasal Swab  Result Value Ref Range Status   SARS Coronavirus 2 by RT PCR POSITIVE (A) NEGATIVE Final     Comment: (NOTE) SARS-CoV-2 target nucleic acids are DETECTED.  The SARS-CoV-2 RNA is generally detectable in upper respiratory specimens during the acute phase of infection. Positive results are indicative of the presence of the identified virus, but do not rule out bacterial infection or co-infection with other pathogens not detected by the test. Clinical correlation with patient history and other diagnostic information is necessary to determine patient infection status. The expected result is Negative.  Fact Sheet for Patients: EntrepreneurPulse.com.au  Fact Sheet for Healthcare Providers: IncredibleEmployment.be  This test is not yet approved or cleared by the [place] FDA and  has been authorized for detection and/or diagnosis of SARS-CoV-2 by FDA under an Emergency Use Authorization (EUA).  This EUA will remain in effect (meaning this test can be used) for the duration of  the COVID-19 declaration under Section 564(b)(1) of the A ct, 21 U.S.C. section 360bbb-3(b)(1), unless the authorization is terminated or revoked sooner.  Influenza A by PCR NEGATIVE NEGATIVE Final   Influenza B by PCR NEGATIVE NEGATIVE Final    Comment: (NOTE) The Xpert Xpress SARS-CoV-2/FLU/RSV plus assay is intended as an aid in the diagnosis of influenza from Nasopharyngeal swab specimens and should not be used as a sole basis for treatment. Nasal washings and aspirates are unacceptable for Xpert Xpress SARS-CoV-2/FLU/RSV testing.  Fact Sheet for Patients: EntrepreneurPulse.com.au  Fact Sheet for Healthcare Providers: IncredibleEmployment.be  This test is not yet approved or cleared by the [place] FDA and has been authorized for detection and/or diagnosis of SARS-CoV-2 by FDA under an Emergency Use Authorization (EUA). This EUA will remain in effect (meaning this test can be used) for the duration of  the COVID-19 declaration under Section 564(b)(1) of the Act, 21 U.S.C. section 360bbb-3(b)(1), unless the authorization is terminated or revoked.     Resp Syncytial Virus by PCR NEGATIVE NEGATIVE Final    Comment: (NOTE) Fact Sheet for Patients: EntrepreneurPulse.com.au  Fact Sheet for Healthcare Providers: IncredibleEmployment.be  This test is not yet approved or cleared by the [place] FDA and has been authorized for detection and/or diagnosis of SARS-CoV-2 by FDA under an Emergency Use Authorization (EUA). This EUA will remain in effect (meaning this test can be used) for the duration of the COVID-19 declaration under Section 564(b)(1) of the Act, 21 U.S.C. section 360bbb-3(b)(1), unless the authorization is terminated or revoked.  Performed at [facility], [address] [place], [place] [address]   Blood culture (routine x 2)     Status: None (Preliminary result)   Collection Time: 05/28/22 10:36 AM   Specimen: BLOOD  Result Value Ref Range Status   Specimen Description BLOOD BLOOD LEFT ARM  Final   Special Requests NONE  Final   Culture   Final    NO GROWTH 2 DAYS Performed at [facility], [address] [place], [place] [address]    Report Status PENDING  Incomplete  Blood culture (routine x 2)     Status: None (Preliminary result)   Collection Time: 05/28/22  3:20 PM   Specimen: BLOOD RIGHT ARM  Result Value Ref Range Status   Specimen Description   Final    BLOOD RIGHT ARM BOTTLES DRAWN AEROBIC AND ANAEROBIC   Special Requests Blood Culture adequate volume  Final   Culture   Final    NO GROWTH 2 DAYS Performed at [facility], [address], [place], [place] [address]    Report Status PENDING  Incomplete     Radiology Studies: No results found.  Scheduled Meds:  allopurinol  300 mg Per Tube Daily   Chlorhexidine Gluconate Cloth  6 each Topical Daily   dextromethorphan  30 mg Oral BID   famotidine  20 mg Per  Tube Daily   folic acid  1 mg Per Tube Daily   free water  200 mL Per Tube Q8H   gabapentin  300 mg Per Tube QHS   ipratropium-albuterol  3 mL Nebulization TID   magnesium oxide  400 mg Per Tube BID   megestrol  400 mg Per Tube BID   midodrine  5 mg Per Tube TID   multivitamin with minerals  1 tablet Per Tube Daily   mouth rinse  15 mL Mouth Rinse 4 times per day   rivaroxaban  10 mg Oral Q supper   sodium chloride HYPERTONIC  4 mL Nebulization BID   Continuous Infusions:  sodium chloride 55 mL/hr at 05/30/22 0446   ampicillin-sulbactam (  UNASYN) IV 1.5 g (05/30/22 1620)   feeding supplement (JEVITY 1.2 CAL)      LOS: 2 days   Time spent: 35 mins  Malarie Tappen Wynetta Emery, MD How to contact the [facility] Attending or Consulting provider [place] or covering provider during after hours [place], for this patient?  Check the care team in [facility] and look for a) attending/consulting [facility] provider listed and b) the [facility] team listed Log into [email or web address] and use [place]'s universal password to access. If you do not have the password, please contact the hospital operator. Locate the [facility] provider you are looking for under [facility] and page to a number that you can be directly reached. If you still have difficulty reaching the provider, please page the [facility] (Director on Call) for the Hospitalists listed on amion for assistance.  05/30/2022, 4:33 PM

## 2022-05-30 NOTE — Progress Notes (Signed)
Upon assessment patient is alert and oriented to self and place, Disoriented to time. Pt still has meds in a med cup from previous shift ( see prior RN note from [employer]). When this RN asks patient about taking the meds he states " Don't yell at me" and " I forgot".  He allowed this RN to perform a limited assessment. Lungs Diminished bilaterally. Productive Cough present. PEG tube in place. + Bowel Sounds in all quadrants. No edema noted. Pt is very irritable and tells this RN to get out. Bryson Corona Edd Fabian

## 2022-05-30 NOTE — Progress Notes (Signed)
Went in to give patient evening medications, and saw his medications from this morning still sitting at bedside. Pt. Still notes he will take his "damn medicine."

## 2022-05-30 NOTE — Progress Notes (Signed)
Patient refused to takes medications via peg tube. Gave patient his medications but he also refused to take them in front of me. Stating " I'm gonna take my damn medicine." Left medications on bedside table.

## 2022-05-30 NOTE — Progress Notes (Addendum)
This RN and Agricultural consultant Bre spoke with patient and removed medication from room due to safety.This was explained to patient. He voices his displeasure and states " I'll fire you and charge you $100 per pill". He also states "get the fuck out of my room". Floor mats were placed. Call light in reach. Bryson Corona Edd Fabian

## 2022-05-30 NOTE — Progress Notes (Signed)
Walked in to introduce myself as his nurse and notice patient eating ritz cheese crackers and also noticed multiple chips and cookies on patients beside table. Advised patient that he should not have those because they are not included in his diet orders and he said "I fucking paid for them I'm keeping them." I noted I understood that I just did not want him to aspirate, patient still insisted on keeping snacks on table.

## 2022-05-30 NOTE — Progress Notes (Signed)
Upon this RN's assessment patient had meds in a med cup (whole pills) from earlier today when he refused to take when previous RN was in room. He also refused PEG tube to be used. This RN is unsure what pills he has taken and what he has not taken. When this RN inquires about them to patient he states " man, I forgot. No need to yell at me about it". This RN performs physical assessment and inquired about if patient is having a productive cough and he yells out " give it 20 min". He reports it will come out in 20 min. Pt is difficult to obtain information from as he often yells at this RN. He states"get out of here". This RN leaves room and will re-assess patient later. Bryson Corona Edd Fabian

## 2022-05-30 NOTE — Progress Notes (Signed)
NT Adonis Huguenin offered to change patient's bed sheets however, pt declines offer. He also declines CHG bath. Dr Josph Macho- Lamont Snowball has been informed of patient actions and med refusal. Franco Nones

## 2022-05-30 NOTE — Progress Notes (Addendum)
Pt refuses night meds via peg tube. He does allow this RN to infuse Unasyn. Bryson Corona Edd Fabian

## 2022-05-31 DIAGNOSIS — C159 Malignant neoplasm of esophagus, unspecified: Secondary | ICD-10-CM | POA: Diagnosis not present

## 2022-05-31 DIAGNOSIS — J9621 Acute and chronic respiratory failure with hypoxia: Secondary | ICD-10-CM | POA: Diagnosis not present

## 2022-05-31 DIAGNOSIS — U071 COVID-19: Secondary | ICD-10-CM

## 2022-05-31 DIAGNOSIS — Z515 Encounter for palliative care: Secondary | ICD-10-CM

## 2022-05-31 DIAGNOSIS — L8990 Pressure ulcer of unspecified site, unspecified stage: Secondary | ICD-10-CM | POA: Insufficient documentation

## 2022-05-31 DIAGNOSIS — R627 Adult failure to thrive: Secondary | ICD-10-CM | POA: Diagnosis not present

## 2022-05-31 DIAGNOSIS — J189 Pneumonia, unspecified organism: Secondary | ICD-10-CM | POA: Diagnosis not present

## 2022-05-31 LAB — BASIC METABOLIC PANEL
Anion gap: 10 (ref 5–15)
BUN: 16 mg/dL (ref 6–20)
CO2: 26 mmol/L (ref 22–32)
Calcium: 8.6 mg/dL — ABNORMAL LOW (ref 8.9–10.3)
Chloride: 101 mmol/L (ref 98–111)
Creatinine, Ser: 0.8 mg/dL (ref 0.61–1.24)
GFR, Estimated: >60 mL/min (ref >60)
Glucose, Bld: 63 mg/dL — ABNORMAL LOW (ref 70–99)
Potassium: 3.3 mmol/L — ABNORMAL LOW (ref 3.5–5.1)
Sodium: 137 mmol/L (ref 135–145)

## 2022-05-31 LAB — GLUCOSE, CAPILLARY
Glucose-Capillary: 73 mg/dL (ref 70–99)
Glucose-Capillary: 73 mg/dL (ref 70–99)
Glucose-Capillary: 188 mg/dL — ABNORMAL HIGH (ref 70–99)
Glucose-Capillary: 136 mg/dL — ABNORMAL HIGH (ref 70–99)

## 2022-05-31 LAB — CBC
HCT: 28.7 % — ABNORMAL LOW (ref 39.0–52.0)
Hemoglobin: 9.3 g/dL — ABNORMAL LOW (ref 13.0–17.0)
MCH: 25.2 pg — ABNORMAL LOW (ref 26.0–34.0)
MCHC: 32.4 g/dL (ref 30.0–36.0)
MCV: 77.8 fL — ABNORMAL LOW (ref 80.0–100.0)
Platelets: 252 10*3/uL (ref 150–400)
RBC: 3.69 MIL/uL — ABNORMAL LOW (ref 4.22–5.81)
RDW: 14.6 % (ref 11.5–15.5)
WBC: 7.8 10*3/uL (ref 4.0–10.5)
nRBC: 0 % (ref 0.0–0.2)

## 2022-05-31 LAB — MAGNESIUM: Magnesium: 2.2 mg/dL (ref 1.7–2.4)

## 2022-05-31 LAB — PHOSPHORUS: Phosphorus: 2.8 mg/dL (ref 2.5–4.6)

## 2022-05-31 MED ORDER — POTASSIUM CHLORIDE IN NACL 40-0.9 MEQ/L-% IV SOLN: INTRAVENOUS | Status: AC

## 2022-05-31 MED ORDER — IPRATROPIUM-ALBUTEROL 0.5-2.5 (3) MG/3ML IN SOLN
3.0000 mL | Freq: Two times a day (BID) | RESPIRATORY_TRACT | Status: DC
  Administered 2022-05-31 – 2022-06-05 (×10): 3 mL via RESPIRATORY_TRACT
  Filled 2022-05-31 (×10): qty 3

## 2022-05-31 MED ORDER — LOPERAMIDE HCL 2 MG PO CAPS
4.0000 mg | ORAL_CAPSULE | Freq: Once | ORAL | Status: AC
  Administered 2022-05-31: 4 mg via ORAL
  Filled 2022-05-31: qty 2

## 2022-05-31 NOTE — Progress Notes (Signed)
Nutrition Follow up  DOCUMENTATION CODES:      INTERVENTION:  Continuous Jevity 1.2@ 55 ml/hr per tube   If no IVF-Flush tube 200 ml TID  Regular diet ad lib  NUTRITION DIAGNOSIS:   Severe Malnutrition related to chronic illness (esophageal cancer) as evidenced by severe muscle depletion, severe fat depletion.  -patient has PEG tube available but refuses to use   GOAL:  Patient will meet greater than or equal to 90% of their needs  -Not met due to patient insistence of not allowing enteral feeding  MONITOR:  Labs, TF tolerance, Weight trends  REASON FOR ASSESSMENT:   Consult Enteral/tube feeding initiation and management  ASSESSMENT: Patient is an underweight 52 yo male presents from [facility] with PEG tube. History of aspiration pneumonia, squamous cell esophageal cancer, malnutrition, acute on chronic respiratory failure with hypoxia.   Per discussion with nursing patient refusing tube feeding. Diet was advanced to regular and he ate 100% of breakfast per chart. Able to feed himself. Patient has a disagreeable demeanor. Recommend continue tube feeds as patient will allow. It is his right to refuse.  Medications and weights reviewed. Currently pt weighs 38.9 kg (85.5 lb).     Latest Ref Rng & Units 05/31/2022    5:10 AM 05/30/2022    4:59 AM 05/29/2022    5:28 AM  BMP  Glucose 70 - 99 mg/dL 63  91  78   BUN 6 - 20 mg/dL '16  17  23   '$ Creatinine 0.61 - 1.24 mg/dL 0.80  0.67  0.75   Sodium 135 - 145 mmol/L 137  136  132   Potassium 3.5 - 5.1 mmol/L 3.3  2.9  3.4   Chloride 98 - 111 mmol/L 101  101  94   CO2 22 - 32 mmol/L '26  27  29   '$ Calcium 8.9 - 10.3 mg/dL 8.6  8.6  8.6       NUTRITION - FOCUSED PHYSICAL EXAM: Nutrition-Focused physical exam findings are severe fat depletion, severe muscle depletion, and no edema.     Diet Order:   Diet Order             Diet regular Room service appropriate? Yes; Fluid consistency: Thin  Diet effective now                    EDUCATION NEEDS:  Not appropriate for education at this time  Skin:  Skin Assessment: Skin Integrity Issues: Skin Integrity Issues:: Stage I Stage I: sacurm  Last BM:  2/5  Height:   Ht Readings from Last 1 Encounters:  05/28/22 '5\' 6"'$  (1.676 m)    Weight:   Wt Readings from Last 1 Encounters:  05/30/22 38.5 kg    Ideal Body Weight:   59 kg  BMI:  Body mass index is 13.7 kg/m.  Estimated Nutritional Needs:   Kcal:  1600-1700  Protein:  80-85 gr  Fluid:  >1200 ml daily  Colman Cater MS,RD,CSG,LDN Contact: Shea Evans

## 2022-05-31 NOTE — Progress Notes (Signed)
Palliative:   Chart review completed.  Mr. Clemon frequently declines medications and/or tube feeding.  He insist on eating and drinking regardless of aspiration risk but remains full scope/full code.  He declined to name a healthcare power of attorney with me but by law this would be his brother, whose name and address is listed in chart.  Patient needs discussed with transition of care team.  Plan: Continue full scope/full code.  Continue cancer treatments as offered.  Return to long-term care at [facility].  No charge Quinn Axe, NP Palliative Medicine Team  Team Phone [phone] Greater than 50% of this time was spent counseling and coordinating care related to the above assessment and plan.

## 2022-05-31 NOTE — Progress Notes (Signed)
Pt refused AM blood sugar. Pt refused medication overnight. Dr Josph MachoLamont Snowball was made aware. Port dressing was nonadherent. This RN cleaned and changed dressing. Bryson Corona Edd Fabian

## 2022-05-31 NOTE — Progress Notes (Signed)
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  He is a resident of [facility] long-term care.  He was seen in the emergency department 1 week ago and diagnosed with a right-sided pneumonia but refused admission at that time.  He was discharged [place] on Augmentin which reportedly was started a couple of days ago at [facility].    Today he complained of shortness of breath and EMS was called found his SpO2 to be 76% on 3 L.  He was placed on a nonrebreather with improvement.  His ED workup reveals a worsening right-sided pneumonia with concern for abscess lung.  The CT also reveals some right medial lobe gas and fluid concerning for possible necrosis.  Patient was stabilized in the ED and admission was requested for further management.  Patient continues to insist that he be full code and wants to continue cancer treatment.  He is due for radiation treatment on 05/30/2022 and wants to be discharged prior to that time.   Assessment/Plan: Aspiration Pneumonia/Necrotizing Pneumonia -2/4 CTA chest--no PE; 2.0 x 1.6 cm gas fluid collection medial RLL -continue unasyn -appreciate pulm consult -pt reports that he has been aspirating his secretions  -he is also likely aspirating his oral consumption as well -continue ampicillin/sulbactam -aspiration precautions -PT INSISTS ON ORAL EATING DESPITE RISKS -SLP evaluation  -Pt refusing care unless we give him a regular diet -He is high risk for more aspiration with  regular diet -Pt insisting on regular diet only and verbalized understanding after being counseled on risks of recurrent aspiration, intubation, death.  Acute respiratory failure with hypoxia -due to necrotizing pneumonia and COVID-19 -initially presented with tachypnea with oxygen saturation 76% on 3L -now stable on RA  Chronic Hypotension -pt has had soft BPs for most of his hospitalization -continue midodrine -am cortisol 4.0   G-tube Malfunction -G tube has a hole>>leaking -reconsult general surgery>>placed new Gtube 11/15 -early AM 03/09/22--Gtube "fell out" -discussed with Dr. Morene Rankins meatus closed>>consult IR to place new PEG via old tract -11/17--new G-tube placed by IR>>restart enteral feeding -goal rate of Vital 1.2 = 60 cc/hr   Hypokalemia Secondary to poor oral intake. Patient states that the Osmolite which was prescribed last month gives him "terrible diarrhea" so he has not been compliant with it. Dietician consult placed and patient will be started on  - Magnesium is normal - replace potassium regularly (he is supposed to be taking daily liquid potassium) - repleted    Stage IV B Metastatic Squamous Cell Cancer - his chemotherapy was placed on temporary hold due to severe weight loss -now on XRT at [facility] - he is followed by Dr. Delton Coombes - palliative care met with him during last admission and he remained in denial about the severity of illness and wanted to be full code/full scope care  - follow up with Dr. Delton Coombes for ongoing discussion   Frequent Falls - likely exacerbated by severe debility, malnutrition, weight loss - fall precautions recommended - PT ordered recommending SNF    Failure to thrive  - Pt seems unable to  care for self at home - pt agrees to restart enteral feeding this evening   Severe protein calorie malnutrition - resume regular diet plus PEG tube feeds  this evening - monitor for refeeding syndrome, recheck Mg, Phos in  AM, replete as needed    Tobacco  - nicotine patch ordered - counseled patient on tobacco cessation    Chronic nausea and vomiting - secondary to esophageal malignancy - treat supportively - pt insists upon po intake, will not follow medical advice   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  He complain of not getting hot dogs and chicken wings.    Objective: Vitals:   05/30/22 2030 05/30/22 2030 05/31/22 0422 05/31/22 0736  BP:  111/76 116/87   Pulse:  90 96   Resp:  20 20   Temp:  98.3 F (36.8 C) 98.9 F (37.2 C)   TempSrc:  Oral Oral   SpO2: 99% 99% 97% 100%  Weight:      Height:        Intake/Output Summary (Last 24 hours) at 05/31/2022 1705 Last data filed at 05/31/2022 0900 Gross per 24 hour  Intake 240 ml  Output 125 ml  Net 115 ml   Weight change:  Exam:  General:  Pt is alert, follows commands appropriately, not in acute distress HEENT: No icterus, No thrush, No neck mass, [place]/AT Cardiovascular: RRR, S1/S2, no rubs, no gallops Respiratory: bilateral rales.  No wheeze Abdomen: Soft/+BS, non tender, non distended, no guarding Extremities: trace LE edema, No lymphangitis, No petechiae, No rashes, no synovitis   Data Reviewed: I have personally reviewed following labs and imaging studies Basic Metabolic Panel: Recent Labs  Lab 05/28/22 0842 05/29/22 0528 05/30/22 0459 05/31/22 0510  NA 131* 132* 136 137  K 3.6 3.4* 2.9* 3.3*  CL 89* 94* 101 101  CO2 '30 29 27 26  '$ GLUCOSE 72 78 91 63*  BUN 29* 23* 17 16   CREATININE 0.79 0.75 0.67 0.80  CALCIUM 9.0 8.6* 8.6* 8.6*  MG 2.0 1.9 1.8 2.2  PHOS  --  2.8 2.4* 2.8   Liver Function Tests: Recent Labs  Lab 05/28/22 0842  AST 23  ALT 11  ALKPHOS 91  BILITOT 0.8  PROT 6.5  ALBUMIN 2.2*   No results for input(s): "LIPASE", "AMYLASE" in the last 168 hours. No results for input(s): "AMMONIA" in the last 168 hours. Coagulation Profile: No results for input(s): "INR", "PROTIME" in the last 168 hours. CBC: Recent Labs  Lab 05/28/22 0842 05/29/22 0528 05/30/22 0459 05/31/22 0510  WBC 4.7 5.8 6.8 7.8  NEUTROABS 2.9  --   --   --   HGB 11.0* 9.0* 9.3* 9.3*  HCT 33.6* 26.9* 29.1* 28.7*  MCV 77.2* 76.6* 77.6* 77.8*  PLT 390 281 308 252   Cardiac Enzymes: No results for input(s): "CKTOTAL", "CKMB", "CKMBINDEX", "TROPONINI" in the last 168 hours. BNP: Invalid input(s): "POCBNP" CBG: Recent Labs  Lab 05/30/22 2027 05/30/22 2334 05/31/22 0804 05/31/22 1119 05/31/22 1606  GLUCAP 102* 82 73 73 188*   HbA1C:  No results for input(s): "HGBA1C" in the last 72 hours. Urine analysis:    Component Value Date/Time   COLORURINE AMBER (A) 02/16/2022 0500   APPEARANCEUR CLOUDY (A) 02/16/2022 0500   LABSPEC >1.046 (H) 02/16/2022 0500   PHURINE 5.0 02/16/2022 0500   GLUCOSEU NEGATIVE 02/16/2022 0500   HGBUR NEGATIVE 02/16/2022 0500   BILIRUBINUR SMALL (A) 02/16/2022 0500   KETONESUR NEGATIVE 02/16/2022 0500   PROTEINUR 30 (A) 02/16/2022 0500   NITRITE NEGATIVE 02/16/2022 0500   LEUKOCYTESUR NEGATIVE 02/16/2022 0500   Sepsis Labs: '@LABRCNTIP'$ (procalcitonin:4,lacticidven:4) ) Recent Results (from the past 240 hour(s))  Resp panel by RT-PCR (RSV, Flu A&B, Covid) Anterior Nasal Swab     Status: Abnormal   Collection Time: 05/22/22  4:22 PM   Specimen: Anterior Nasal Swab  Result Value Ref Range Status   SARS Coronavirus 2 by RT PCR POSITIVE (A) NEGATIVE Final    Comment: (NOTE) SARS-CoV-2 target nucleic acids are DETECTED.  The  SARS-CoV-2 RNA is generally detectable in upper respiratory specimens during the acute phase of infection. Positive results are indicative of the presence of the identified virus, but do not rule out bacterial infection or co-infection with other pathogens not detected by the test. Clinical correlation with patient history and other diagnostic information is necessary to determine patient infection status. The expected result is Negative.  Fact Sheet for Patients: EntrepreneurPulse.com.au  Fact Sheet for Healthcare Providers: IncredibleEmployment.be  This test is not yet approved or cleared by the [place] FDA and  has been authorized for detection and/or diagnosis of SARS-CoV-2 by FDA under an Emergency Use Authorization (EUA).  This EUA will remain in effect (meaning this test can be used) for the duration of  the COVID-19 declaration under Section 564(b)(1) of the A ct, 21 U.S.C. section 360bbb-3(b)(1), unless the authorization is terminated or revoked sooner.     Influenza A by PCR NEGATIVE NEGATIVE Final   Influenza B by PCR NEGATIVE NEGATIVE Final    Comment: (NOTE) The Xpert Xpress SARS-CoV-2/FLU/RSV plus assay is intended as an aid in the diagnosis of influenza from Nasopharyngeal swab specimens and should not be used as a sole basis for treatment. Nasal washings and aspirates are unacceptable for Xpert Xpress SARS-CoV-2/FLU/RSV testing.  Fact Sheet for Patients: EntrepreneurPulse.com.au  Fact Sheet for Healthcare Providers: IncredibleEmployment.be  This test is not yet approved or cleared by the [place] FDA and has been authorized for detection and/or diagnosis of SARS-CoV-2 by FDA under an Emergency Use Authorization (EUA). This EUA will remain in effect (meaning this test can be used) for the duration of the COVID-19 declaration under Section 564(b)(1) of the Act, 21 U.S.C. section  360bbb-3(b)(1), unless the authorization is terminated or revoked.     Resp Syncytial Virus by PCR NEGATIVE NEGATIVE Final    Comment: (NOTE) Fact Sheet for Patients: EntrepreneurPulse.com.au  Fact Sheet for Healthcare Providers: IncredibleEmployment.be  This test is not yet approved or cleared by the [place] FDA and has been authorized for detection and/or diagnosis of SARS-CoV-2 by FDA under an Emergency Use Authorization (EUA). This EUA will remain in effect (meaning this test can be used) for the duration of the COVID-19 declaration under Section 564(b)(1) of the Act, 21 U.S.C. section 360bbb-3(b)(1), unless the authorization is terminated or revoked.  Performed at [facility], [address], [place], [place] [address]   Blood culture (routine x 2)     Status: None (Preliminary result)   Collection Time: 05/28/22 10:36 AM   Specimen: BLOOD  Result Value Ref Range Status   Specimen Description BLOOD BLOOD LEFT ARM  Final   Special Requests NONE  Final   Culture   Final    NO GROWTH 2 DAYS Performed at [facility], [address] [place], [place] [address]    Report Status PENDING  Incomplete  Blood culture (routine x 2)     Status: None (Preliminary result)   Collection Time: 05/28/22  3:20 PM   Specimen: BLOOD RIGHT ARM  Result Value Ref Range Status   Specimen Description   Final    BLOOD RIGHT ARM BOTTLES DRAWN AEROBIC AND ANAEROBIC   Special Requests Blood Culture adequate volume  Final   Culture   Final    NO GROWTH 2 DAYS Performed at [facility], [address] [place], [place] [address]    Report Status PENDING  Incomplete     Scheduled Meds:  allopurinol  300 mg Per Tube Daily   Chlorhexidine Gluconate Cloth  6 each Topical Daily   famotidine  20 mg Per Tube Daily   folic acid  1 mg Per Tube Daily   free water  200 mL Per Tube Q8H   gabapentin  300 mg Per Tube QHS   ipratropium-albuterol  3 mL Nebulization  BID   magnesium oxide  400 mg Per Tube BID   megestrol  400 mg Per Tube BID   midodrine  5 mg Per Tube TID   multivitamin with minerals  1 tablet Per Tube Daily   mouth rinse  15 mL Mouth Rinse 4 times per day   rivaroxaban  10 mg Oral Q supper   sodium chloride HYPERTONIC  4 mL Nebulization BID   Continuous Infusions:  0.9 % NaCl with KCl 40 mEq / L     ampicillin-sulbactam (UNASYN) IV 1.5 g (05/31/22 1600)   feeding supplement (JEVITY 1.2 CAL)      Procedures/Studies: CT Angio Chest PE W and/or Wo Contrast  Result Date: 05/28/2022 CLINICAL DATA:  Shortness of breath.Abdominal pain. History of esophageal cancer. * Tracking Code: BO * EXAM: CT ANGIOGRAPHY CHEST CT ABDOMEN AND PELVIS WITH CONTRAST TECHNIQUE: Multidetector CT imaging of the chest was performed using the standard protocol during bolus administration of intravenous contrast. Multiplanar CT image reconstructions and MIPs were obtained to evaluate the vascular anatomy. Multidetector CT imaging of the abdomen and pelvis was performed using the standard protocol during bolus administration of intravenous contrast. RADIATION DOSE REDUCTION: This exam was performed according to the departmental dose-optimization program which includes automated exposure control, adjustment of the mA and/or kV according to patient size and/or use of iterative reconstruction technique. CONTRAST:  144m OMNIPAQUE IOHEXOL 350 MG/ML SOLN COMPARISON:  01/31/2022 FINDINGS: CTA CHEST FINDINGS Cardiovascular: The heart size is normal. No substantial pericardial effusion. No thoracic aortic aneurysm. There is no filling defect in the opacified pulmonary arteries to suggest the presence of an acute pulmonary embolus. Left Port-A-Cath tip is positioned in the distal SVC. Mediastinum/Nodes: Diffusely increased attenuation of mediastinal fat may reflect edema. No mediastinal lymphadenopathy. There is no hilar lymphadenopathy. Proximal esophagus is patulous with food debris  in the proximal and mid segment compatible with the known neoplasm and similar to prior. There is no axillary lymphadenopathy. Lungs/Pleura: Circumferential bronchial wall thickening is noted in both lungs, right greater than left. There is extensive tree-in-bud nodularity in the right lung and left lower lobe with areas of consolidative airspace disease in the right upper lobe measuring 2.7 x 1.3 cm. Patchy confluent  airspace disease is superimposed on the nodularity in the right middle lobe. There is complete collapse of the right lower lobe with obliteration of the bronchus intermedius. 2.0 x 1.6 cm collection of gas and fluid in the medial right lower lobe may represent an area of necrosis with fistula formation or abscess. Small right pleural effusion evident. Musculoskeletal: Multiple healed right rib fractures noted. There also multiple healed fractures on the left. Review of the MIP images confirms the above findings. CT ABDOMEN and PELVIS FINDINGS Hepatobiliary: Tiny hypodensity in the medial segment left liver is stable in the interval, likely benign. Gallbladder not well visualized. No intrahepatic or extrahepatic biliary dilation. Pancreas: No focal mass lesion. No dilatation of the main duct. No intraparenchymal cyst. No peripancreatic edema. Spleen: No splenomegaly. No focal mass lesion. Adrenals/Urinary Tract: No adrenal nodule or mass. Tiny hypodensities in both kidneys are too small to characterize, similar to prior likely benign. No hydroureter. Bladder unremarkable. Stomach/Bowel: Stomach is nondistended. Gastrostomy tube evident. Scattered mildly distended fluid-filled small bowel loops in the abdomen and pelvis measuring up to 3.4 cm diameter. Neither the terminal ileum nor the appendix are discretely visible. Gas and fluid is scattered along the length of the mildly distended colon with gas and fluid visible in the rectum. This appearance can be seen in the setting of diarrhea.  Vascular/Lymphatic: There is mild atherosclerotic calcification of the abdominal aorta without aneurysm. There is no gastrohepatic or hepatoduodenal ligament lymphadenopathy. No retroperitoneal or mesenteric lymphadenopathy. No pelvic sidewall lymphadenopathy. Assessment for lymphadenopathy is limited by patient cachexia and diffuse edema soft tissues. Reproductive: The prostate gland and seminal vesicles are unremarkable. Other: Probable small volume intraperitoneal free fluid. Diffuse edema of mesentery and extraperitoneal tissues. Musculoskeletal: Bone windows reveal no worrisome lytic or sclerotic osseous lesions. Mild wedge compression deformity is seen at T4 and T7, similar to prior. Diffuse body wall edema evident. Review of the MIP images confirms the above findings. IMPRESSION: 1. No CT evidence for acute pulmonary embolus. 2. Marked progression of extensive tree-in-bud nodularity in both lungs, right greater than left, with areas of consolidative airspace disease in the right upper lobe and right middle lobe. Imaging features are compatible with an infectious/inflammatory etiology. Aspiration not excluded. 3. Complete collapse of the right lower lobe with obliteration of the bronchus intermedius. 2.0 x 1.6 cm collection of gas and fluid in the medial right lower lobe is progressive in the interval and may represent an area of necrosis with fistula formation or abscess. 4. Small right pleural effusion. 5. Scattered mildly distended fluid-filled small bowel loops in the abdomen and pelvis measuring up to 3.4 cm diameter. Gas and fluid is scattered along the length of the mildly distended colon with gas and fluid visible in the rectum. This appearance can be seen in the setting of diarrhea. Generally, imaging features suggest component of underlying dysmotility/ileus 6. Marked cachexia with diffuse edema of the mesentery, extraperitoneal fat, and body wall. 7. Patulous proximal and mid esophagus with debris  in the lumen, compatible with the patient's known esophageal neoplasm. Electronically Signed   By: Misty Stanley M.D.   On: 05/28/2022 12:49   CT ABDOMEN PELVIS W CONTRAST  Result Date: 05/28/2022 CLINICAL DATA:  Shortness of breath.Abdominal pain. History of esophageal cancer. * Tracking Code: BO * EXAM: CT ANGIOGRAPHY CHEST CT ABDOMEN AND PELVIS WITH CONTRAST TECHNIQUE: Multidetector CT imaging of the chest was performed using the standard protocol during bolus administration of intravenous contrast. Multiplanar CT image reconstructions and MIPs  were obtained to evaluate the vascular anatomy. Multidetector CT imaging of the abdomen and pelvis was performed using the standard protocol during bolus administration of intravenous contrast. RADIATION DOSE REDUCTION: This exam was performed according to the departmental dose-optimization program which includes automated exposure control, adjustment of the mA and/or kV according to patient size and/or use of iterative reconstruction technique. CONTRAST:  110m OMNIPAQUE IOHEXOL 350 MG/ML SOLN COMPARISON:  01/31/2022 FINDINGS: CTA CHEST FINDINGS Cardiovascular: The heart size is normal. No substantial pericardial effusion. No thoracic aortic aneurysm. There is no filling defect in the opacified pulmonary arteries to suggest the presence of an acute pulmonary embolus. Left Port-A-Cath tip is positioned in the distal SVC. Mediastinum/Nodes: Diffusely increased attenuation of mediastinal fat may reflect edema. No mediastinal lymphadenopathy. There is no hilar lymphadenopathy. Proximal esophagus is patulous with food debris in the proximal and mid segment compatible with the known neoplasm and similar to prior. There is no axillary lymphadenopathy. Lungs/Pleura: Circumferential bronchial wall thickening is noted in both lungs, right greater than left. There is extensive tree-in-bud nodularity in the right lung and left lower lobe with areas of consolidative airspace  disease in the right upper lobe measuring 2.7 x 1.3 cm. Patchy confluent airspace disease is superimposed on the nodularity in the right middle lobe. There is complete collapse of the right lower lobe with obliteration of the bronchus intermedius. 2.0 x 1.6 cm collection of gas and fluid in the medial right lower lobe may represent an area of necrosis with fistula formation or abscess. Small right pleural effusion evident. Musculoskeletal: Multiple healed right rib fractures noted. There also multiple healed fractures on the left. Review of the MIP images confirms the above findings. CT ABDOMEN and PELVIS FINDINGS Hepatobiliary: Tiny hypodensity in the medial segment left liver is stable in the interval, likely benign. Gallbladder not well visualized. No intrahepatic or extrahepatic biliary dilation. Pancreas: No focal mass lesion. No dilatation of the main duct. No intraparenchymal cyst. No peripancreatic edema. Spleen: No splenomegaly. No focal mass lesion. Adrenals/Urinary Tract: No adrenal nodule or mass. Tiny hypodensities in both kidneys are too small to characterize, similar to prior likely benign. No hydroureter. Bladder unremarkable. Stomach/Bowel: Stomach is nondistended. Gastrostomy tube evident. Scattered mildly distended fluid-filled small bowel loops in the abdomen and pelvis measuring up to 3.4 cm diameter. Neither the terminal ileum nor the appendix are discretely visible. Gas and fluid is scattered along the length of the mildly distended colon with gas and fluid visible in the rectum. This appearance can be seen in the setting of diarrhea. Vascular/Lymphatic: There is mild atherosclerotic calcification of the abdominal aorta without aneurysm. There is no gastrohepatic or hepatoduodenal ligament lymphadenopathy. No retroperitoneal or mesenteric lymphadenopathy. No pelvic sidewall lymphadenopathy. Assessment for lymphadenopathy is limited by patient cachexia and diffuse edema soft tissues.  Reproductive: The prostate gland and seminal vesicles are unremarkable. Other: Probable small volume intraperitoneal free fluid. Diffuse edema of mesentery and extraperitoneal tissues. Musculoskeletal: Bone windows reveal no worrisome lytic or sclerotic osseous lesions. Mild wedge compression deformity is seen at T4 and T7, similar to prior. Diffuse body wall edema evident. Review of the MIP images confirms the above findings. IMPRESSION: 1. No CT evidence for acute pulmonary embolus. 2. Marked progression of extensive tree-in-bud nodularity in both lungs, right greater than left, with areas of consolidative airspace disease in the right upper lobe and right middle lobe. Imaging features are compatible with an infectious/inflammatory etiology. Aspiration not excluded. 3. Complete collapse of the right lower lobe with  obliteration of the bronchus intermedius. 2.0 x 1.6 cm collection of gas and fluid in the medial right lower lobe is progressive in the interval and may represent an area of necrosis with fistula formation or abscess. 4. Small right pleural effusion. 5. Scattered mildly distended fluid-filled small bowel loops in the abdomen and pelvis measuring up to 3.4 cm diameter. Gas and fluid is scattered along the length of the mildly distended colon with gas and fluid visible in the rectum. This appearance can be seen in the setting of diarrhea. Generally, imaging features suggest component of underlying dysmotility/ileus 6. Marked cachexia with diffuse edema of the mesentery, extraperitoneal fat, and body wall. 7. Patulous proximal and mid esophagus with debris in the lumen, compatible with the patient's known esophageal neoplasm. Electronically Signed   By: Misty Stanley M.D.   On: 05/28/2022 12:49   DG Chest Portable 1 View  Result Date: 05/28/2022 CLINICAL DATA:  Shortness of breath. EXAM: PORTABLE CHEST 1 VIEW COMPARISON:  05/22/2022 FINDINGS: Lungs are hyperexpanded. Left lung clear. Increasing  consolidative opacity at the right base with patchy airspace disease in the right mid and upper lung similar to prior. The cardio pericardial silhouette is enlarged. Left Port-A-Cath tip overlies the distal SVC. Telemetry leads overlie the chest. IMPRESSION: Increasing consolidative opacity at the right base with patchy airspace disease in the right mid and upper lung. Electronically Signed   By: Misty Stanley M.D.   On: 05/28/2022 09:05   DG Chest 1 View  Result Date: 05/22/2022 CLINICAL DATA:  Cough. EXAM: CHEST  1 VIEW COMPARISON:  March 02, 2022 FINDINGS: The heart size and mediastinal contours are stable. Right central venous line is unchanged. Patchy consolidation identified throughout the right lung. The left lung is clear. The visualized skeletal structures are stable. IMPRESSION: Pneumonia throughout the right lung. Electronically Signed   By: Abelardo Diesel M.D.   On: 05/22/2022 16:53    Orson Eva, DO  [facility]  If 7PM-7AM, please contact night-coverage [email or web address] Password [email or web address] 05/31/2022, 5:05 PM   LOS: 3 days

## 2022-06-01 ENCOUNTER — Encounter: Payer: Self-pay | Admitting: Hematology

## 2022-06-01 ENCOUNTER — Ambulatory Visit: Payer: Self-pay | Admitting: *Deleted

## 2022-06-01 ENCOUNTER — Encounter (HOSPITAL_COMMUNITY): Payer: Self-pay | Admitting: Hematology

## 2022-06-01 DIAGNOSIS — J690 Pneumonitis due to inhalation of food and vomit: Secondary | ICD-10-CM | POA: Diagnosis not present

## 2022-06-01 DIAGNOSIS — C159 Malignant neoplasm of esophagus, unspecified: Secondary | ICD-10-CM | POA: Diagnosis not present

## 2022-06-01 DIAGNOSIS — R627 Adult failure to thrive: Secondary | ICD-10-CM | POA: Diagnosis not present

## 2022-06-01 DIAGNOSIS — Z720 Tobacco use: Secondary | ICD-10-CM | POA: Diagnosis not present

## 2022-06-01 LAB — CBC
HCT: 28.8 % — ABNORMAL LOW (ref 39.0–52.0)
Hemoglobin: 9.5 g/dL — ABNORMAL LOW (ref 13.0–17.0)
MCH: 25.4 pg — ABNORMAL LOW (ref 26.0–34.0)
MCHC: 33 g/dL (ref 30.0–36.0)
MCV: 77 fL — ABNORMAL LOW (ref 80.0–100.0)
Platelets: 346 10*3/uL (ref 150–400)
RBC: 3.74 MIL/uL — ABNORMAL LOW (ref 4.22–5.81)
RDW: 15 % (ref 11.5–15.5)
WBC: 9.4 10*3/uL (ref 4.0–10.5)
nRBC: 0 % (ref 0.0–0.2)

## 2022-06-01 LAB — BASIC METABOLIC PANEL
Anion gap: 10 (ref 5–15)
BUN: 14 mg/dL (ref 6–20)
CO2: 23 mmol/L (ref 22–32)
Calcium: 8.2 mg/dL — ABNORMAL LOW (ref 8.9–10.3)
Chloride: 101 mmol/L (ref 98–111)
Creatinine, Ser: 0.72 mg/dL (ref 0.61–1.24)
GFR, Estimated: >60 mL/min (ref >60)
Glucose, Bld: 112 mg/dL — ABNORMAL HIGH (ref 70–99)
Potassium: 3.2 mmol/L — ABNORMAL LOW (ref 3.5–5.1)
Sodium: 134 mmol/L — ABNORMAL LOW (ref 135–145)

## 2022-06-01 LAB — GLUCOSE, CAPILLARY
Glucose-Capillary: 111 mg/dL — ABNORMAL HIGH (ref 70–99)
Glucose-Capillary: 113 mg/dL — ABNORMAL HIGH (ref 70–99)
Glucose-Capillary: 131 mg/dL — ABNORMAL HIGH (ref 70–99)
Glucose-Capillary: 137 mg/dL — ABNORMAL HIGH (ref 70–99)
Glucose-Capillary: 145 mg/dL — ABNORMAL HIGH (ref 70–99)
Glucose-Capillary: 156 mg/dL — ABNORMAL HIGH (ref 70–99)
Glucose-Capillary: 201 mg/dL — ABNORMAL HIGH (ref 70–99)

## 2022-06-01 LAB — RESP PANEL BY RT-PCR (RSV, FLU A&B, COVID)  RVPGX2
Influenza A by PCR: NEGATIVE
Influenza B by PCR: NEGATIVE
Resp Syncytial Virus by PCR: NEGATIVE
SARS Coronavirus 2 by RT PCR: NEGATIVE

## 2022-06-01 LAB — PROCALCITONIN: Procalcitonin: 0.15 ng/mL

## 2022-06-01 LAB — MAGNESIUM: Magnesium: 1.9 mg/dL (ref 1.7–2.4)

## 2022-06-01 LAB — PHOSPHORUS: Phosphorus: 1.9 mg/dL — ABNORMAL LOW (ref 2.5–4.6)

## 2022-06-01 MED ORDER — K PHOS MONO-SOD PHOS DI & MONO 155-852-130 MG PO TABS
500.0000 mg | ORAL_TABLET | Freq: Two times a day (BID) | ORAL | Status: DC
  Administered 2022-06-01 – 2022-06-03 (×4): 500 mg
  Filled 2022-06-01 (×4): qty 2

## 2022-06-01 MED ORDER — POTASSIUM CHLORIDE 20 MEQ PO PACK
40.0000 meq | PACK | Freq: Every day | ORAL | Status: DC
  Administered 2022-06-01 – 2022-06-02 (×2): 40 meq
  Filled 2022-06-01 (×3): qty 2

## 2022-06-01 NOTE — Patient Outreach (Signed)
  Care Coordination   Follow Up Visit Note   06/01/2022 Name: Trevor [name] MRN: [id number] DOB: May 28, 1970  Trevor [name] is a 52 y.o. year old male who sees Simpson-Tarokh, Leann, DO for primary care.   What matters to the patients health and wellness today?  Patient scheduled for outreach today, noted he is currently hospitalized.  Will update assigned RNCM Learta Codding) and have her follow up once patient is discharged home.     SDOH assessments and interventions completed:  No     Care Coordination Interventions:  No, not indicated   Follow up plan:  Pending hospital discharge    Encounter Outcome:  No Answer   Valente David, RN, MSN, CCM [facility] Care Management Care Management Coordinator [phone]

## 2022-06-01 NOTE — Consult Note (Signed)
   [facility] CM Inpatient Consult   06/01/2022  Jkai Arwood 09-May-1970 [id number]  .[place] [facility] [[facility]] Patient: Sales promotion account executive  Managed Medicaid: [employer]  Primary Care Provider:  Hal Morales, DO which is a provider for skilled nursing facilities [not a [facility] provider]   Patient was reviewed for high risk score  barriers to care. Patient is long term care at [facility].  Plan: Will update [facility] Care Coordination team that patient is LTC SNF and no community care coordination from [facility] that current plan is for patient to return to LTC SNF for needs post hospital per inpatient [facility] team notes as well as palliative care consult notes.   For questions or referrals, please contact:   Natividad Brood, RN BSN [place]  [phone] business mobile phone Toll free office [phone]  *[place]  [phone] Fax number: [phone] [email or web address]

## 2022-06-01 NOTE — Progress Notes (Signed)
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  He is a resident of [facility] long-term care.  He was seen in the emergency department 1 week PTA and diagnosed with a right-sided pneumonia but refused admission at that time.  He was discharged [place] on Augmentin which reportedly was started a couple of days ago at [facility].    On the day of admission, he complained of shortness of breath and EMS was called found his SpO2 to be 76% on 3 L.  He was placed on a nonrebreather with improvement.  His ED workup reveals a worsening right-sided pneumonia with concern for abscess lung.  The CT also reveals some right medial lobe gas and fluid concerning for possible necrosis.  Patient was stabilized in the ED and admission was requested for further management.  Patient continues to insist that he be full code and wants to continue cancer treatment.  He is due for radiation treatment on 05/30/2022 and wants to be discharged prior to that time.   Assessment/Plan:  Aspiration Pneumonia/Necrotizing Pneumonia -2/4 CTA chest--no PE; 2.0 x 1.6 cm gas fluid collection medial RLL -continue unasyn -appreciate pulm consult -pt reports that he has been aspirating his secretions  -he is also likely aspirating his oral consumption as well -continue ampicillin/sulbactam -aspiration precautions -PT INSISTS ON ORAL EATING DESPITE RISKS -SLP evaluation  -Pt refusing care unless we give him a regular diet -He is high risk for more aspiration with regular  diet -Pt insisting on regular diet only and verbalized understanding after being counseled on risks of recurrent aspiration, intubation, death.   Acute respiratory failure with hypoxia -due to necrotizing pneumonia and COVID-19 -initially presented with tachypnea with oxygen saturation 76% on 3L -now stable on RA   Chronic Hypotension -pt has had soft BPs for most of his hospitalizations -continue midodrine   Hypokalemia Secondary to poor oral intake. Patient states that the Osmolite which was prescribed last month gives him "terrible diarrhea" so he has not been compliant with it. Dietician consult placed and patient will be started on  - Magnesium is normal - replace potassium regularly (he is supposed to be taking daily liquid potassium) - repleted    Stage IV B Metastatic Squamous Cell Cancer - his chemotherapy was placed on temporary hold due to severe weight loss -now on XRT at [facility] - he is followed by Dr. Delton Coombes - palliative care met with him during last admission and he remained in denial about the severity of illness and wanted to be full code/full scope care  - follow up with Dr. Delton Coombes for ongoing discussion   Frequent Falls - likely exacerbated by severe debility, malnutrition, weight loss - fall precautions recommended - PT ordered recommending SNF    Failure to thrive  - Pt seems unable to care for self at home - pt agrees to restart enteral feeding this evening   Severe protein calorie malnutrition - resume regular diet plus PEG tube feeds  this evening - monitor for refeeding syndrome, recheck Mg, Phos in AM, replete as needed    Tobacco  - nicotine  patch ordered - counseled patient on tobacco cessation    Chronic nausea and vomiting - secondary to esophageal malignancy - treat supportively - pt insists upon po intake, will not follow medical advice   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  Denies cp, sob, abd pain, headache, f/c  Objective: Vitals:   05/31/22 2047 05/31/22 2200 06/01/22 0351 06/01/22 0847  BP:  119/85 102/80   Pulse:  (!) 107 91 97  Resp:  '20 18 18  '$ Temp:  98.8 F (37.1 C) 98.3 F (36.8 C)   TempSrc:  Oral Oral   SpO2: 100% 98% 99% 98%  Weight:   41.3 kg   Height:        Intake/Output Summary (Last 24 hours) at 06/01/2022 1724 Last data filed at 05/31/2022 1759 Gross per 24 hour  Intake 3855.6 ml  Output --  Net 3855.6 ml   Weight change:  Exam:  General:  Pt is alert, follows commands appropriately, not in acute distress HEENT: No icterus, No thrush, No neck mass, [place]/AT Cardiovascular: RRR, S1/S2, no rubs, no gallops Respiratory: bibasilar rales. No wheeze Abdomen: Soft/+BS, non tender, non distended, no guarding Extremities: No edema, No lymphangitis, No petechiae, No rashes, no synovitis   Data Reviewed: I have personally reviewed following labs and imaging studies Basic Metabolic Panel: Recent Labs  Lab 05/28/22 0842 05/29/22 0528 05/30/22 0459 05/31/22 0510 06/01/22 0510  NA 131* 132* 136 137 134*  K 3.6 3.4* 2.9* 3.3* 3.2*  CL 89* 94* 101 101 101  CO2 '30 29 27 26 23  '$ GLUCOSE 72 78 91 63* 112*  BUN 29* 23* '17 16 14  '$ CREATININE 0.79 0.75 0.67 0.80 0.72  CALCIUM 9.0 8.6* 8.6* 8.6* 8.2*  MG 2.0 1.9 1.8 2.2 1.9  PHOS  --  2.8 2.4* 2.8 1.9*   Liver Function Tests: Recent Labs  Lab 05/28/22 0842  AST 23  ALT 11  ALKPHOS 91  BILITOT 0.8  PROT 6.5  ALBUMIN 2.2*   No results for input(s):  "LIPASE", "AMYLASE" in the last 168 hours. No results for input(s): "AMMONIA" in the last 168 hours. Coagulation Profile: No results for input(s): "INR", "PROTIME" in the last 168 hours. CBC: Recent Labs  Lab 05/28/22 0842 05/29/22 0528 05/30/22 0459 05/31/22 0510 06/01/22 0510  WBC 4.7 5.8 6.8 7.8 9.4  NEUTROABS 2.9  --   --   --   --   HGB 11.0* 9.0* 9.3* 9.3* 9.5*  HCT 33.6* 26.9* 29.1* 28.7* 28.8*  MCV 77.2* 76.6* 77.6* 77.8* 77.0*  PLT 390 281 308 252 346   Cardiac Enzymes: No results for input(s): "CKTOTAL", "CKMB", "CKMBINDEX", "TROPONINI" in the last 168 hours. BNP: Invalid input(s): "POCBNP" CBG: Recent Labs  Lab 05/31/22 2358 06/01/22 0353 06/01/22 0752 06/01/22 1203 06/01/22 1648  GLUCAP 145* 156* 137* 111* 131*   HbA1C: No results for input(s): "HGBA1C" in the last 72 hours. Urine analysis:    Component Value Date/Time   COLORURINE AMBER (  A) 02/16/2022 0500   APPEARANCEUR CLOUDY (A) 02/16/2022 0500   LABSPEC >1.046 (H) 02/16/2022 0500   PHURINE 5.0 02/16/2022 0500   GLUCOSEU NEGATIVE 02/16/2022 0500   HGBUR NEGATIVE 02/16/2022 0500   BILIRUBINUR SMALL (A) 02/16/2022 0500   KETONESUR NEGATIVE 02/16/2022 0500   PROTEINUR 30 (A) 02/16/2022 0500   NITRITE NEGATIVE 02/16/2022 0500   LEUKOCYTESUR NEGATIVE 02/16/2022 0500   Sepsis Labs: '@LABRCNTIP'$ (procalcitonin:4,lacticidven:4) ) Recent Results (from the past 240 hour(s))  Blood culture (routine x 2)     Status: None (Preliminary result)   Collection Time: 05/28/22 10:36 AM   Specimen: BLOOD  Result Value Ref Range Status   Specimen Description BLOOD BLOOD LEFT ARM  Final   Special Requests NONE  Final   Culture   Final    NO GROWTH 4 DAYS Performed at [facility], [address], [place], [place] [address]    Report Status PENDING  Incomplete  Blood culture (routine x 2)     Status: None (Preliminary result)   Collection Time: 05/28/22  3:20 PM   Specimen: BLOOD RIGHT ARM  Result Value Ref  Range Status   Specimen Description   Final    BLOOD RIGHT ARM BOTTLES DRAWN AEROBIC AND ANAEROBIC   Special Requests Blood Culture adequate volume  Final   Culture   Final    NO GROWTH 4 DAYS Performed at [facility], [address] [place], [place] [address]    Report Status PENDING  Incomplete  Resp panel by RT-PCR (RSV, Flu A&B, Covid) Anterior Nasal Swab     Status: None   Collection Time: 06/01/22 11:21 AM   Specimen: Anterior Nasal Swab  Result Value Ref Range Status   SARS Coronavirus 2 by RT PCR NEGATIVE NEGATIVE Final    Comment: (NOTE) SARS-CoV-2 target nucleic acids are NOT DETECTED.  The SARS-CoV-2 RNA is generally detectable in upper respiratory specimens during the acute phase of infection. The lowest concentration of SARS-CoV-2 viral copies this assay can detect is 138 copies/mL. A negative result does not preclude SARS-Cov-2 infection and should not be used as the sole basis for treatment or other patient management decisions. A negative result may occur with  improper specimen collection/handling, submission of specimen other than nasopharyngeal swab, presence of viral mutation(s) within the areas targeted by this assay, and inadequate number of viral copies(<138 copies/mL). A negative result must be combined with clinical observations, patient history, and epidemiological information. The expected result is Negative.  Fact Sheet for Patients:  EntrepreneurPulse.com.au  Fact Sheet for Healthcare Providers:  IncredibleEmployment.be  This test is no t yet approved or cleared by the [place] FDA and  has been authorized for detection and/or diagnosis of SARS-CoV-2 by FDA under an Emergency Use Authorization (EUA). This EUA will remain  in effect (meaning this test can be used) for the duration of the COVID-19 declaration under Section 564(b)(1) of the Act, 21 U.S.C.section 360bbb-3(b)(1), unless the authorization is  terminated  or revoked sooner.       Influenza A by PCR NEGATIVE NEGATIVE Final   Influenza B by PCR NEGATIVE NEGATIVE Final    Comment: (NOTE) The Xpert Xpress SARS-CoV-2/FLU/RSV plus assay is intended as an aid in the diagnosis of influenza from Nasopharyngeal swab specimens and should not be used as a sole basis for treatment. Nasal washings and aspirates are unacceptable for Xpert Xpress SARS-CoV-2/FLU/RSV testing.  Fact Sheet for Patients: EntrepreneurPulse.com.au  Fact Sheet for Healthcare Providers: IncredibleEmployment.be  This test is not yet  approved or cleared by the [place] and has been authorized for detection and/or diagnosis of SARS-CoV-2 by FDA under an Emergency Use Authorization (EUA). This EUA will remain in effect (meaning this test can be used) for the duration of the COVID-19 declaration under Section 564(b)(1) of the Act, 21 U.S.C. section 360bbb-3(b)(1), unless the authorization is terminated or revoked.     Resp Syncytial Virus by PCR NEGATIVE NEGATIVE Final    Comment: (NOTE) Fact Sheet for Patients: EntrepreneurPulse.com.au  Fact Sheet for Healthcare Providers: IncredibleEmployment.be  This test is not yet approved or cleared by the [place] FDA and has been authorized for detection and/or diagnosis of SARS-CoV-2 by FDA under an Emergency Use Authorization (EUA). This EUA will remain in effect (meaning this test can be used) for the duration of the COVID-19 declaration under Section 564(b)(1) of the Act, 21 U.S.C. section 360bbb-3(b)(1), unless the authorization is terminated or revoked.  Performed at [facility], [address] [place], [place] [address]      Scheduled Meds:  allopurinol  300 mg Per Tube Daily   Chlorhexidine Gluconate Cloth  6 each Topical Daily   famotidine  20 mg Per Tube Daily   folic acid  1 mg Per Tube Daily   free water  200 mL  Per Tube Q8H   gabapentin  300 mg Per Tube QHS   ipratropium-albuterol  3 mL Nebulization BID   magnesium oxide  400 mg Per Tube BID   megestrol  400 mg Per Tube BID   midodrine  5 mg Per Tube TID   multivitamin with minerals  1 tablet Per Tube Daily   mouth rinse  15 mL Mouth Rinse 4 times per day   phosphorus  500 mg Per Tube BID   potassium chloride  40 mEq Per Tube Daily   rivaroxaban  10 mg Oral Q supper   sodium chloride HYPERTONIC  4 mL Nebulization BID   Continuous Infusions:  0.9 % NaCl with KCl 40 mEq / L 75 mL/hr at 05/31/22 1759   ampicillin-sulbactam (UNASYN) IV 1.5 g (06/01/22 1514)   feeding supplement (JEVITY 1.2 CAL) 1,000 mL (05/31/22 2200)    Procedures/Studies: CT Angio Chest PE W and/or Wo Contrast  Result Date: 05/28/2022 CLINICAL DATA:  Shortness of breath.Abdominal pain. History of esophageal cancer. * Tracking Code: BO * EXAM: CT ANGIOGRAPHY CHEST CT ABDOMEN AND PELVIS WITH CONTRAST TECHNIQUE: Multidetector CT imaging of the chest was performed using the standard protocol during bolus administration of intravenous contrast. Multiplanar CT image reconstructions and MIPs were obtained to evaluate the vascular anatomy. Multidetector CT imaging of the abdomen and pelvis was performed using the standard protocol during bolus administration of intravenous contrast. RADIATION DOSE REDUCTION: This exam was performed according to the departmental dose-optimization program which includes automated exposure control, adjustment of the mA and/or kV according to patient size and/or use of iterative reconstruction technique. CONTRAST:  142m OMNIPAQUE IOHEXOL 350 MG/ML SOLN COMPARISON:  01/31/2022 FINDINGS: CTA CHEST FINDINGS Cardiovascular: The heart size is normal. No substantial pericardial effusion. No thoracic aortic aneurysm. There is no filling defect in the opacified pulmonary arteries to suggest the presence of an acute pulmonary embolus. Left Port-A-Cath tip is positioned in  the distal SVC. Mediastinum/Nodes: Diffusely increased attenuation of mediastinal fat may reflect edema. No mediastinal lymphadenopathy. There is no hilar lymphadenopathy. Proximal esophagus is patulous with food debris in the proximal and mid segment compatible with the known neoplasm and similar to prior. There is  no axillary lymphadenopathy. Lungs/Pleura: Circumferential bronchial wall thickening is noted in both lungs, right greater than left. There is extensive tree-in-bud nodularity in the right lung and left lower lobe with areas of consolidative airspace disease in the right upper lobe measuring 2.7 x 1.3 cm. Patchy confluent airspace disease is superimposed on the nodularity in the right middle lobe. There is complete collapse of the right lower lobe with obliteration of the bronchus intermedius. 2.0 x 1.6 cm collection of gas and fluid in the medial right lower lobe may represent an area of necrosis with fistula formation or abscess. Small right pleural effusion evident. Musculoskeletal: Multiple healed right rib fractures noted. There also multiple healed fractures on the left. Review of the MIP images confirms the above findings. CT ABDOMEN and PELVIS FINDINGS Hepatobiliary: Tiny hypodensity in the medial segment left liver is stable in the interval, likely benign. Gallbladder not well visualized. No intrahepatic or extrahepatic biliary dilation. Pancreas: No focal mass lesion. No dilatation of the main duct. No intraparenchymal cyst. No peripancreatic edema. Spleen: No splenomegaly. No focal mass lesion. Adrenals/Urinary Tract: No adrenal nodule or mass. Tiny hypodensities in both kidneys are too small to characterize, similar to prior likely benign. No hydroureter. Bladder unremarkable. Stomach/Bowel: Stomach is nondistended. Gastrostomy tube evident. Scattered mildly distended fluid-filled small bowel loops in the abdomen and pelvis measuring up to 3.4 cm diameter. Neither the terminal ileum nor the  appendix are discretely visible. Gas and fluid is scattered along the length of the mildly distended colon with gas and fluid visible in the rectum. This appearance can be seen in the setting of diarrhea. Vascular/Lymphatic: There is mild atherosclerotic calcification of the abdominal aorta without aneurysm. There is no gastrohepatic or hepatoduodenal ligament lymphadenopathy. No retroperitoneal or mesenteric lymphadenopathy. No pelvic sidewall lymphadenopathy. Assessment for lymphadenopathy is limited by patient cachexia and diffuse edema soft tissues. Reproductive: The prostate gland and seminal vesicles are unremarkable. Other: Probable small volume intraperitoneal free fluid. Diffuse edema of mesentery and extraperitoneal tissues. Musculoskeletal: Bone windows reveal no worrisome lytic or sclerotic osseous lesions. Mild wedge compression deformity is seen at T4 and T7, similar to prior. Diffuse body wall edema evident. Review of the MIP images confirms the above findings. IMPRESSION: 1. No CT evidence for acute pulmonary embolus. 2. Marked progression of extensive tree-in-bud nodularity in both lungs, right greater than left, with areas of consolidative airspace disease in the right upper lobe and right middle lobe. Imaging features are compatible with an infectious/inflammatory etiology. Aspiration not excluded. 3. Complete collapse of the right lower lobe with obliteration of the bronchus intermedius. 2.0 x 1.6 cm collection of gas and fluid in the medial right lower lobe is progressive in the interval and may represent an area of necrosis with fistula formation or abscess. 4. Small right pleural effusion. 5. Scattered mildly distended fluid-filled small bowel loops in the abdomen and pelvis measuring up to 3.4 cm diameter. Gas and fluid is scattered along the length of the mildly distended colon with gas and fluid visible in the rectum. This appearance can be seen in the setting of diarrhea. Generally,  imaging features suggest component of underlying dysmotility/ileus 6. Marked cachexia with diffuse edema of the mesentery, extraperitoneal fat, and body wall. 7. Patulous proximal and mid esophagus with debris in the lumen, compatible with the patient's known esophageal neoplasm. Electronically Signed   By: Misty Stanley M.D.   On: 05/28/2022 12:49   CT ABDOMEN PELVIS W CONTRAST  Result Date: 05/28/2022 CLINICAL DATA:  Shortness of breath.Abdominal pain. History of esophageal cancer. * Tracking Code: BO * EXAM: CT ANGIOGRAPHY CHEST CT ABDOMEN AND PELVIS WITH CONTRAST TECHNIQUE: Multidetector CT imaging of the chest was performed using the standard protocol during bolus administration of intravenous contrast. Multiplanar CT image reconstructions and MIPs were obtained to evaluate the vascular anatomy. Multidetector CT imaging of the abdomen and pelvis was performed using the standard protocol during bolus administration of intravenous contrast. RADIATION DOSE REDUCTION: This exam was performed according to the departmental dose-optimization program which includes automated exposure control, adjustment of the mA and/or kV according to patient size and/or use of iterative reconstruction technique. CONTRAST:  119m OMNIPAQUE IOHEXOL 350 MG/ML SOLN COMPARISON:  01/31/2022 FINDINGS: CTA CHEST FINDINGS Cardiovascular: The heart size is normal. No substantial pericardial effusion. No thoracic aortic aneurysm. There is no filling defect in the opacified pulmonary arteries to suggest the presence of an acute pulmonary embolus. Left Port-A-Cath tip is positioned in the distal SVC. Mediastinum/Nodes: Diffusely increased attenuation of mediastinal fat may reflect edema. No mediastinal lymphadenopathy. There is no hilar lymphadenopathy. Proximal esophagus is patulous with food debris in the proximal and mid segment compatible with the known neoplasm and similar to prior. There is no axillary lymphadenopathy. Lungs/Pleura:  Circumferential bronchial wall thickening is noted in both lungs, right greater than left. There is extensive tree-in-bud nodularity in the right lung and left lower lobe with areas of consolidative airspace disease in the right upper lobe measuring 2.7 x 1.3 cm. Patchy confluent airspace disease is superimposed on the nodularity in the right middle lobe. There is complete collapse of the right lower lobe with obliteration of the bronchus intermedius. 2.0 x 1.6 cm collection of gas and fluid in the medial right lower lobe may represent an area of necrosis with fistula formation or abscess. Small right pleural effusion evident. Musculoskeletal: Multiple healed right rib fractures noted. There also multiple healed fractures on the left. Review of the MIP images confirms the above findings. CT ABDOMEN and PELVIS FINDINGS Hepatobiliary: Tiny hypodensity in the medial segment left liver is stable in the interval, likely benign. Gallbladder not well visualized. No intrahepatic or extrahepatic biliary dilation. Pancreas: No focal mass lesion. No dilatation of the main duct. No intraparenchymal cyst. No peripancreatic edema. Spleen: No splenomegaly. No focal mass lesion. Adrenals/Urinary Tract: No adrenal nodule or mass. Tiny hypodensities in both kidneys are too small to characterize, similar to prior likely benign. No hydroureter. Bladder unremarkable. Stomach/Bowel: Stomach is nondistended. Gastrostomy tube evident. Scattered mildly distended fluid-filled small bowel loops in the abdomen and pelvis measuring up to 3.4 cm diameter. Neither the terminal ileum nor the appendix are discretely visible. Gas and fluid is scattered along the length of the mildly distended colon with gas and fluid visible in the rectum. This appearance can be seen in the setting of diarrhea. Vascular/Lymphatic: There is mild atherosclerotic calcification of the abdominal aorta without aneurysm. There is no gastrohepatic or hepatoduodenal ligament  lymphadenopathy. No retroperitoneal or mesenteric lymphadenopathy. No pelvic sidewall lymphadenopathy. Assessment for lymphadenopathy is limited by patient cachexia and diffuse edema soft tissues. Reproductive: The prostate gland and seminal vesicles are unremarkable. Other: Probable small volume intraperitoneal free fluid. Diffuse edema of mesentery and extraperitoneal tissues. Musculoskeletal: Bone windows reveal no worrisome lytic or sclerotic osseous lesions. Mild wedge compression deformity is seen at T4 and T7, similar to prior. Diffuse body wall edema evident. Review of the MIP images confirms the above findings. IMPRESSION: 1. No CT evidence for acute pulmonary embolus. 2.  Marked progression of extensive tree-in-bud nodularity in both lungs, right greater than left, with areas of consolidative airspace disease in the right upper lobe and right middle lobe. Imaging features are compatible with an infectious/inflammatory etiology. Aspiration not excluded. 3. Complete collapse of the right lower lobe with obliteration of the bronchus intermedius. 2.0 x 1.6 cm collection of gas and fluid in the medial right lower lobe is progressive in the interval and may represent an area of necrosis with fistula formation or abscess. 4. Small right pleural effusion. 5. Scattered mildly distended fluid-filled small bowel loops in the abdomen and pelvis measuring up to 3.4 cm diameter. Gas and fluid is scattered along the length of the mildly distended colon with gas and fluid visible in the rectum. This appearance can be seen in the setting of diarrhea. Generally, imaging features suggest component of underlying dysmotility/ileus 6. Marked cachexia with diffuse edema of the mesentery, extraperitoneal fat, and body wall. 7. Patulous proximal and mid esophagus with debris in the lumen, compatible with the patient's known esophageal neoplasm. Electronically Signed   By: Misty Stanley M.D.   On: 05/28/2022 12:49   DG Chest  Portable 1 View  Result Date: 05/28/2022 CLINICAL DATA:  Shortness of breath. EXAM: PORTABLE CHEST 1 VIEW COMPARISON:  05/22/2022 FINDINGS: Lungs are hyperexpanded. Left lung clear. Increasing consolidative opacity at the right base with patchy airspace disease in the right mid and upper lung similar to prior. The cardio pericardial silhouette is enlarged. Left Port-A-Cath tip overlies the distal SVC. Telemetry leads overlie the chest. IMPRESSION: Increasing consolidative opacity at the right base with patchy airspace disease in the right mid and upper lung. Electronically Signed   By: Misty Stanley M.D.   On: 05/28/2022 09:05   DG Chest 1 View  Result Date: 05/22/2022 CLINICAL DATA:  Cough. EXAM: CHEST  1 VIEW COMPARISON:  March 02, 2022 FINDINGS: The heart size and mediastinal contours are stable. Right central venous line is unchanged. Patchy consolidation identified throughout the right lung. The left lung is clear. The visualized skeletal structures are stable. IMPRESSION: Pneumonia throughout the right lung. Electronically Signed   By: Abelardo Diesel M.D.   On: 05/22/2022 16:53    Orson Eva, DO  [facility]  If 7PM-7AM, please contact night-coverage [email or web address] Password [facility] 06/01/2022, 5:24 PM   LOS: 4 days

## 2022-06-01 NOTE — Progress Notes (Signed)
Pt stated "can you unhook me from my tube feedings for awhile, Ive been hooked up to it since 10 o'clock last night" This nurse stated that she would need to talk to the doctor first and he stated "You dont need to do that, if you dont unhook me I can just do it myself." Unhooked patient and notified MD. Patient stated that I could hook him back up before I leave.

## 2022-06-02 ENCOUNTER — Other Ambulatory Visit: Payer: Self-pay

## 2022-06-02 DIAGNOSIS — J984 Other disorders of lung: Secondary | ICD-10-CM | POA: Diagnosis not present

## 2022-06-02 DIAGNOSIS — C159 Malignant neoplasm of esophagus, unspecified: Secondary | ICD-10-CM | POA: Diagnosis not present

## 2022-06-02 DIAGNOSIS — E43 Unspecified severe protein-calorie malnutrition: Secondary | ICD-10-CM | POA: Diagnosis not present

## 2022-06-02 DIAGNOSIS — J189 Pneumonia, unspecified organism: Secondary | ICD-10-CM | POA: Diagnosis not present

## 2022-06-02 LAB — BASIC METABOLIC PANEL
Anion gap: 6 (ref 5–15)
BUN: 16 mg/dL (ref 6–20)
CO2: 24 mmol/L (ref 22–32)
Calcium: 8.2 mg/dL — ABNORMAL LOW (ref 8.9–10.3)
Chloride: 109 mmol/L (ref 98–111)
Creatinine, Ser: 0.76 mg/dL (ref 0.61–1.24)
GFR, Estimated: >60 mL/min (ref >60)
Glucose, Bld: 87 mg/dL (ref 70–99)
Potassium: 4.6 mmol/L (ref 3.5–5.1)
Sodium: 139 mmol/L (ref 135–145)

## 2022-06-02 LAB — MAGNESIUM: Magnesium: 1.9 mg/dL (ref 1.7–2.4)

## 2022-06-02 LAB — GLUCOSE, CAPILLARY
Glucose-Capillary: 187 mg/dL — ABNORMAL HIGH (ref 70–99)
Glucose-Capillary: 92 mg/dL (ref 70–99)
Glucose-Capillary: 155 mg/dL — ABNORMAL HIGH (ref 70–99)
Glucose-Capillary: 81 mg/dL (ref 70–99)
Glucose-Capillary: 93 mg/dL (ref 70–99)

## 2022-06-02 LAB — PHOSPHORUS: Phosphorus: 2.2 mg/dL — ABNORMAL LOW (ref 2.5–4.6)

## 2022-06-02 MED ORDER — AMOXICILLIN-POT CLAVULANATE 875-125 MG PO TABS: 1.0000 | ORAL_TABLET | Freq: Two times a day (BID) | ORAL | 0 refills | Status: DC

## 2022-06-02 NOTE — Progress Notes (Signed)
Pt is refusing to continue Jevity at this time. MD made aware.

## 2022-06-02 NOTE — TOC Progression Note (Addendum)
Transition of Care [facility]) - Progression Note    Patient Details  Name: Trevor [name] MRN: [id number] Date of Birth: May 16, 1970  Transition of Care [facility]) CM/SW [place], [place] Phone Number: 06/02/2022, 10:55 AM  Clinical Narrative:    CSW spoke to [name] at [facility] admissions who states at this time they do not have a bed for pt. They are working to open a room with a bed and will keep CSW updated when this becomes available. CSW updated MD and RN of this. TOC to follow.   Addendum 2pm: CSW spoke to [name] with [name] who states that she still does not have a bed for pt at this time. TOC to follow tomorrow.   Expected Discharge Plan: Long Term Nursing Home Barriers to Discharge: Continued Medical Work up  Expected Discharge Plan and Services In-house Referral: Clinical Social Work     Living arrangements for the past 2 months: [place] Expected Discharge Date: 06/02/22                                     Social Determinants of Health (SDOH) Interventions SDOH Screenings   Food Insecurity: No Food Insecurity (05/28/2022)  Housing: Low Risk  (05/28/2022)  Transportation Needs: No Transportation Needs (05/28/2022)  Utilities: Not At Risk (05/28/2022)  Depression (PHQ2-9): Low Risk  (02/07/2022)  Financial Resource Strain: High Risk (09/28/2021)  Stress: No Stress Concern Present (02/07/2022)  Tobacco Use: High Risk (05/29/2022)    Readmission Risk Interventions    05/29/2022   12:43 PM 03/03/2022   11:46 AM 01/24/2022    3:13 PM  Readmission Risk Prevention Plan  Transportation Screening Complete Complete Complete  PCP or Specialist Appt within 3-5 Days   Complete  HRI or [place]   Complete  Social Work Consult for [place] Planning/Counseling   Complete  Palliative Care Screening   Complete  Medication Review Press photographer) Complete Complete Complete  HRI or Home Care Consult Complete Complete   SW Recovery  Care/Counseling Consult Complete Complete   Palliative Care Screening Complete Complete   Skilled Nursing Facility Complete Patient Refused

## 2022-06-02 NOTE — Discharge Summary (Signed)
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 Lung Abscess   Gout   GERD (gastroesophageal reflux disease)   Alcohol abuse   Tobacco abuse   Vitamin D deficiency   Protein-calorie malnutrition, severe   Hyponatremia   PEG (percutaneous endoscopic gastrostomy) status ([place])   FTT (failure to thrive) in adult   Falls frequently   Gait instability   Sepsis due to pneumonia ([place])   SIADH (syndrome of inappropriate ADH production) ([place])   Acute on chronic respiratory failure with hypoxia ([place])   COVID-19 virus infection   Pressure injury of skin  Resolved Problems:   * No resolved hospital problems. *  Hospital Course: 52 year old gentleman longtime smoker diagnosed in 2023 with stage IV squamous cell esophageal cancer with metastasis, dysphagia and odynophagia with PEG tube dependency, severe protein calorie malnutrition, frequent falls and failure to thrive, anemia, history of alcohol abuse, frequent hospitalizations, currently being treated at [facility] with chemoradiation.  He is a resident of [facility] long-term care.  He was seen in the emergency department 1 week PTA and diagnosed with a right-sided pneumonia but refused admission at that time.  He was discharged [place] on Augmentin which reportedly was started a couple of days ago at [facility].    On the day of admission, he complained of shortness of breath and EMS was called found his SpO2 to be 76% on 3 L.  He was placed on a nonrebreather with  improvement.  His ED workup reveals a worsening right-sided pneumonia with concern for abscess lung.  The CT also reveals some right medial lobe gas and fluid concerning for possible necrosis.  Patient was stabilized in the ED and admission was requested for further management.  Patient continues to insist that he be full code and wants to continue cancer treatment.  He is due for radiation treatment on 05/30/2022 and wants to be discharged prior to that time. He received 6 days of IV Unasyn.  He remained stable clinically, afebrile, hemodynamically stable.  He was weaned off oxygen.  During the hospitalization, pt continued his usual behavioral habits on intermittently refusing care, medications, and enteral feedings.  Assessment and Plan: Aspiration Pneumonia/Necrotizing Pneumonia -2/4 CTA chest--no PE; 2.0 x 1.6 cm gas fluid collection medial RLL -continue unasyn -appreciate pulm consult -pt reports that he has been aspirating his secretions  -he is also likely aspirating his oral consumption as well -continue ampicillin/sulbactam -aspiration precautions -PT INSISTS ON ORAL EATING DESPITE RISKS -SLP evaluation--pt refuses to follow any recommendations -Pt refusing care unless we give him a regular diet -He is high risk for more aspiration with regular diet -Pt insisting on regular diet only and verbalized understanding after being counseled on risks of recurrent aspiration, intubation, death. -received 6 days IV unasyn>>d/c to SNF with amox/clav x 22 more days   Acute respiratory failure with hypoxia -due to necrotizing pneumonia and COVID-19 -initially presented with tachypnea with oxygen saturation 76% on 3L -now stable on RA   Chronic Hypotension -pt has had soft  BPs for most of his hospitalizations -continue midodrine with stable BPs   Hypokalemia Secondary to poor oral intake. Patient states that the Osmolite which was prescribed last month gives him "terrible diarrhea" so he has  not been compliant with it. Dietician consult placed and patient will be started on  - Magnesium is normal - replace potassium regularly (he is supposed to be taking daily liquid potassium) - repleted   Hypophosphatemia -repleted   Stage IV B Metastatic Squamous Cell Cancer - his chemotherapy was placed on temporary hold due to severe weight loss -now on XRT at [facility] - he is followed by Dr. Delton Coombes - palliative care met with him during last admission and he remained in denial about the severity of illness and wanted to be full code/full scope care  - follow up with Dr. Delton Coombes for ongoing discussion   Frequent Falls - likely exacerbated by severe debility, malnutrition, weight loss - fall precautions recommended - PT ordered recommending SNF    Failure to thrive  - Pt seems unable to care for self at home - pt agrees to restart enteral feeding this evening   Severe protein calorie malnutrition - resume regular diet plus PEG tube feeds  this evening - monitor for refeeding syndrome, recheck Mg, Phos in AM, replete as needed    Tobacco  - nicotine patch ordered - counseled patient on tobacco cessation    Chronic nausea and vomiting - secondary to esophageal malignancy - treat supportively - pt insists upon po intake, will not follow medical advice   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  - gabapentin    Sepsis due to pneumonia -IV fluid resuscitation ordered -present on admission -aggressive IV antibiotics given -supportive measures -blood cultures negative to date          Procedures: As Listed in Progress Note Above   Antibiotics: Unasyn 2/4>>2/9 Amox/clav 2/9>>      Consultants: pulm Procedures performed: none  Disposition: Skilled nursing facility Diet recommendation:  NPO--Isosource 1.5 at 55 cc/hr DISCHARGE MEDICATION: Allergies as of 06/02/2022       Reactions   Pork-derived Products Other (See Comments)   Patient does not  eat pork due to his religious beliefs        Medication List     STOP taking these medications    metoprolol tartrate 25 MG tablet Commonly known as: LOPRESSOR   oxyCODONE 5 MG immediate release tablet Commonly known as: Oxy IR/ROXICODONE   pantoprazole 40 MG tablet Commonly known as: PROTONIX   prochlorperazine 10 MG tablet Commonly known as: COMPAZINE       TAKE these medications    albuterol 108 (90 Base) MCG/ACT inhaler Commonly known as: VENTOLIN HFA Inhale 2 puffs into the lungs every 4 (four) hours as needed for wheezing or shortness of breath.   allopurinol 300 MG tablet Commonly known as: ZYLOPRIM Take 1 tablet (300 mg total) by mouth daily. What changed: how to take this   amoxicillin-clavulanate 875-125 MG tablet Commonly known as: AUGMENTIN Take 1 tablet by mouth every 12 (twelve) hours. X 22 more days What changed: additional instructions   ferrous sulfate 325 (65 FE) MG EC tablet Take 1 tablet (325 mg total) by mouth daily with breakfast. What changed:  how to take this when to take this additional instructions   folic acid 1 MG tablet Commonly known as: FOLVITE Place 1 tablet (1 mg total) into feeding tube daily.   free water Soln Place 200 mLs into  feeding tube every 6 (six) hours. What changed:  how much to take when to take this additional instructions   gabapentin 250 MG/5ML solution Commonly known as: NEURONTIN Place 6 mLs (300 mg total) into feeding tube at bedtime. What changed: when to take this   Isosource 1.5 Cal Liqd 1,000 mLs by Feeding Tube route See admin instructions. Isosource 1.5. Rate: 55 mLs/hour for 24 hours/day. Administer 1000 mLs daily. What changed: Another medication with the same name was removed. Continue taking this medication, and follow the directions you see here.   loperamide 2 MG capsule Commonly known as: IMODIUM 4 mg See admin instructions. 4 mg via g-tube as needed for diarrhea after each loose  stool. Maximum of 8 capsules per day.   magnesium oxide 400 (240 Mg) MG tablet Commonly known as: MAG-OX Place 400 mg into feeding tube 2 (two) times daily.   megestrol 400 MG/10ML suspension Commonly known as: MEGACE Take 10 mLs (400 mg total) by mouth 2 (two) times daily. What changed: how to take this   midodrine 5 MG tablet Commonly known as: PROAMATINE Place 1 tablet (5 mg total) into feeding tube 3 (three) times daily with meals. What changed: when to take this   multivitamin with minerals tablet Take 1 tablet by mouth daily. What changed: how to take this   nicotine 14 mg/24hr patch Commonly known as: NICODERM CQ - dosed in mg/24 hours Place 1 patch (14 mg total) onto the skin daily as needed (nicotine craving).   omeprazole 2 mg/mL Susp oral suspension Commonly known as: KONVOMEP Place 20 mg into feeding tube daily.   ondansetron 8 MG tablet Commonly known as: ZOFRAN Place 8 mg into feeding tube every 6 (six) hours as needed for nausea or vomiting.   potassium chloride SA 20 MEQ tablet Commonly known as: Klor-Con M20 Take 1 tablet (20 mEq total) by mouth daily. What changed:  how to take this additional instructions        Discharge Exam: Filed Weights   06/01/22 0351 06/02/22 0434 06/02/22 0700  Weight: 41.3 kg 45 kg 45 kg   HEENT:  [place]/AT, No thrush, no icterus CV:  RRR, no rub, no S3, no S4 Lung:  bibasilar rales. No wheeze Abd:  soft/+BS, NT Ext:  No edema, no lymphangitis, no synovitis, no rash   Condition at discharge: stable  The results of significant diagnostics from this hospitalization (including imaging, microbiology, ancillary and laboratory) are listed below for reference.   Imaging Studies: CT Angio Chest PE W and/or Wo Contrast  Result Date: 05/28/2022 CLINICAL DATA:  Shortness of breath.Abdominal pain. History of esophageal cancer. * Tracking Code: BO * EXAM: CT ANGIOGRAPHY CHEST CT ABDOMEN AND PELVIS WITH CONTRAST TECHNIQUE:  Multidetector CT imaging of the chest was performed using the standard protocol during bolus administration of intravenous contrast. Multiplanar CT image reconstructions and MIPs were obtained to evaluate the vascular anatomy. Multidetector CT imaging of the abdomen and pelvis was performed using the standard protocol during bolus administration of intravenous contrast. RADIATION DOSE REDUCTION: This exam was performed according to the departmental dose-optimization program which includes automated exposure control, adjustment of the mA and/or kV according to patient size and/or use of iterative reconstruction technique. CONTRAST:  142m OMNIPAQUE IOHEXOL 350 MG/ML SOLN COMPARISON:  01/31/2022 FINDINGS: CTA CHEST FINDINGS Cardiovascular: The heart size is normal. No substantial pericardial effusion. No thoracic aortic aneurysm. There is no filling defect in the opacified pulmonary arteries to suggest the presence of an acute  pulmonary embolus. Left Port-A-Cath tip is positioned in the distal SVC. Mediastinum/Nodes: Diffusely increased attenuation of mediastinal fat may reflect edema. No mediastinal lymphadenopathy. There is no hilar lymphadenopathy. Proximal esophagus is patulous with food debris in the proximal and mid segment compatible with the known neoplasm and similar to prior. There is no axillary lymphadenopathy. Lungs/Pleura: Circumferential bronchial wall thickening is noted in both lungs, right greater than left. There is extensive tree-in-bud nodularity in the right lung and left lower lobe with areas of consolidative airspace disease in the right upper lobe measuring 2.7 x 1.3 cm. Patchy confluent airspace disease is superimposed on the nodularity in the right middle lobe. There is complete collapse of the right lower lobe with obliteration of the bronchus intermedius. 2.0 x 1.6 cm collection of gas and fluid in the medial right lower lobe may represent an area of necrosis with fistula formation or  abscess. Small right pleural effusion evident. Musculoskeletal: Multiple healed right rib fractures noted. There also multiple healed fractures on the left. Review of the MIP images confirms the above findings. CT ABDOMEN and PELVIS FINDINGS Hepatobiliary: Tiny hypodensity in the medial segment left liver is stable in the interval, likely benign. Gallbladder not well visualized. No intrahepatic or extrahepatic biliary dilation. Pancreas: No focal mass lesion. No dilatation of the main duct. No intraparenchymal cyst. No peripancreatic edema. Spleen: No splenomegaly. No focal mass lesion. Adrenals/Urinary Tract: No adrenal nodule or mass. Tiny hypodensities in both kidneys are too small to characterize, similar to prior likely benign. No hydroureter. Bladder unremarkable. Stomach/Bowel: Stomach is nondistended. Gastrostomy tube evident. Scattered mildly distended fluid-filled small bowel loops in the abdomen and pelvis measuring up to 3.4 cm diameter. Neither the terminal ileum nor the appendix are discretely visible. Gas and fluid is scattered along the length of the mildly distended colon with gas and fluid visible in the rectum. This appearance can be seen in the setting of diarrhea. Vascular/Lymphatic: There is mild atherosclerotic calcification of the abdominal aorta without aneurysm. There is no gastrohepatic or hepatoduodenal ligament lymphadenopathy. No retroperitoneal or mesenteric lymphadenopathy. No pelvic sidewall lymphadenopathy. Assessment for lymphadenopathy is limited by patient cachexia and diffuse edema soft tissues. Reproductive: The prostate gland and seminal vesicles are unremarkable. Other: Probable small volume intraperitoneal free fluid. Diffuse edema of mesentery and extraperitoneal tissues. Musculoskeletal: Bone windows reveal no worrisome lytic or sclerotic osseous lesions. Mild wedge compression deformity is seen at T4 and T7, similar to prior. Diffuse body wall edema evident. Review of the  MIP images confirms the above findings. IMPRESSION: 1. No CT evidence for acute pulmonary embolus. 2. Marked progression of extensive tree-in-bud nodularity in both lungs, right greater than left, with areas of consolidative airspace disease in the right upper lobe and right middle lobe. Imaging features are compatible with an infectious/inflammatory etiology. Aspiration not excluded. 3. Complete collapse of the right lower lobe with obliteration of the bronchus intermedius. 2.0 x 1.6 cm collection of gas and fluid in the medial right lower lobe is progressive in the interval and may represent an area of necrosis with fistula formation or abscess. 4. Small right pleural effusion. 5. Scattered mildly distended fluid-filled small bowel loops in the abdomen and pelvis measuring up to 3.4 cm diameter. Gas and fluid is scattered along the length of the mildly distended colon with gas and fluid visible in the rectum. This appearance can be seen in the setting of diarrhea. Generally, imaging features suggest component of underlying dysmotility/ileus 6. Marked cachexia with diffuse edema of  the mesentery, extraperitoneal fat, and body wall. 7. Patulous proximal and mid esophagus with debris in the lumen, compatible with the patient's known esophageal neoplasm. Electronically Signed   By: Misty Stanley M.D.   On: 05/28/2022 12:49   CT ABDOMEN PELVIS W CONTRAST  Result Date: 05/28/2022 CLINICAL DATA:  Shortness of breath.Abdominal pain. History of esophageal cancer. * Tracking Code: BO * EXAM: CT ANGIOGRAPHY CHEST CT ABDOMEN AND PELVIS WITH CONTRAST TECHNIQUE: Multidetector CT imaging of the chest was performed using the standard protocol during bolus administration of intravenous contrast. Multiplanar CT image reconstructions and MIPs were obtained to evaluate the vascular anatomy. Multidetector CT imaging of the abdomen and pelvis was performed using the standard protocol during bolus administration of intravenous  contrast. RADIATION DOSE REDUCTION: This exam was performed according to the departmental dose-optimization program which includes automated exposure control, adjustment of the mA and/or kV according to patient size and/or use of iterative reconstruction technique. CONTRAST:  188m OMNIPAQUE IOHEXOL 350 MG/ML SOLN COMPARISON:  01/31/2022 FINDINGS: CTA CHEST FINDINGS Cardiovascular: The heart size is normal. No substantial pericardial effusion. No thoracic aortic aneurysm. There is no filling defect in the opacified pulmonary arteries to suggest the presence of an acute pulmonary embolus. Left Port-A-Cath tip is positioned in the distal SVC. Mediastinum/Nodes: Diffusely increased attenuation of mediastinal fat may reflect edema. No mediastinal lymphadenopathy. There is no hilar lymphadenopathy. Proximal esophagus is patulous with food debris in the proximal and mid segment compatible with the known neoplasm and similar to prior. There is no axillary lymphadenopathy. Lungs/Pleura: Circumferential bronchial wall thickening is noted in both lungs, right greater than left. There is extensive tree-in-bud nodularity in the right lung and left lower lobe with areas of consolidative airspace disease in the right upper lobe measuring 2.7 x 1.3 cm. Patchy confluent airspace disease is superimposed on the nodularity in the right middle lobe. There is complete collapse of the right lower lobe with obliteration of the bronchus intermedius. 2.0 x 1.6 cm collection of gas and fluid in the medial right lower lobe may represent an area of necrosis with fistula formation or abscess. Small right pleural effusion evident. Musculoskeletal: Multiple healed right rib fractures noted. There also multiple healed fractures on the left. Review of the MIP images confirms the above findings. CT ABDOMEN and PELVIS FINDINGS Hepatobiliary: Tiny hypodensity in the medial segment left liver is stable in the interval, likely benign. Gallbladder not  well visualized. No intrahepatic or extrahepatic biliary dilation. Pancreas: No focal mass lesion. No dilatation of the main duct. No intraparenchymal cyst. No peripancreatic edema. Spleen: No splenomegaly. No focal mass lesion. Adrenals/Urinary Tract: No adrenal nodule or mass. Tiny hypodensities in both kidneys are too small to characterize, similar to prior likely benign. No hydroureter. Bladder unremarkable. Stomach/Bowel: Stomach is nondistended. Gastrostomy tube evident. Scattered mildly distended fluid-filled small bowel loops in the abdomen and pelvis measuring up to 3.4 cm diameter. Neither the terminal ileum nor the appendix are discretely visible. Gas and fluid is scattered along the length of the mildly distended colon with gas and fluid visible in the rectum. This appearance can be seen in the setting of diarrhea. Vascular/Lymphatic: There is mild atherosclerotic calcification of the abdominal aorta without aneurysm. There is no gastrohepatic or hepatoduodenal ligament lymphadenopathy. No retroperitoneal or mesenteric lymphadenopathy. No pelvic sidewall lymphadenopathy. Assessment for lymphadenopathy is limited by patient cachexia and diffuse edema soft tissues. Reproductive: The prostate gland and seminal vesicles are unremarkable. Other: Probable small volume intraperitoneal free fluid. Diffuse edema  of mesentery and extraperitoneal tissues. Musculoskeletal: Bone windows reveal no worrisome lytic or sclerotic osseous lesions. Mild wedge compression deformity is seen at T4 and T7, similar to prior. Diffuse body wall edema evident. Review of the MIP images confirms the above findings. IMPRESSION: 1. No CT evidence for acute pulmonary embolus. 2. Marked progression of extensive tree-in-bud nodularity in both lungs, right greater than left, with areas of consolidative airspace disease in the right upper lobe and right middle lobe. Imaging features are compatible with an infectious/inflammatory etiology.  Aspiration not excluded. 3. Complete collapse of the right lower lobe with obliteration of the bronchus intermedius. 2.0 x 1.6 cm collection of gas and fluid in the medial right lower lobe is progressive in the interval and may represent an area of necrosis with fistula formation or abscess. 4. Small right pleural effusion. 5. Scattered mildly distended fluid-filled small bowel loops in the abdomen and pelvis measuring up to 3.4 cm diameter. Gas and fluid is scattered along the length of the mildly distended colon with gas and fluid visible in the rectum. This appearance can be seen in the setting of diarrhea. Generally, imaging features suggest component of underlying dysmotility/ileus 6. Marked cachexia with diffuse edema of the mesentery, extraperitoneal fat, and body wall. 7. Patulous proximal and mid esophagus with debris in the lumen, compatible with the patient's known esophageal neoplasm. Electronically Signed   By: Misty Stanley M.D.   On: 05/28/2022 12:49   DG Chest Portable 1 View  Result Date: 05/28/2022 CLINICAL DATA:  Shortness of breath. EXAM: PORTABLE CHEST 1 VIEW COMPARISON:  05/22/2022 FINDINGS: Lungs are hyperexpanded. Left lung clear. Increasing consolidative opacity at the right base with patchy airspace disease in the right mid and upper lung similar to prior. The cardio pericardial silhouette is enlarged. Left Port-A-Cath tip overlies the distal SVC. Telemetry leads overlie the chest. IMPRESSION: Increasing consolidative opacity at the right base with patchy airspace disease in the right mid and upper lung. Electronically Signed   By: Misty Stanley M.D.   On: 05/28/2022 09:05   DG Chest 1 View  Result Date: 05/22/2022 CLINICAL DATA:  Cough. EXAM: CHEST  1 VIEW COMPARISON:  March 02, 2022 FINDINGS: The heart size and mediastinal contours are stable. Right central venous line is unchanged. Patchy consolidation identified throughout the right lung. The left lung is clear. The visualized  skeletal structures are stable. IMPRESSION: Pneumonia throughout the right lung. Electronically Signed   By: Abelardo Diesel M.D.   On: 05/22/2022 16:53    Microbiology: Results for orders placed or performed during the hospital encounter of 05/28/22  Blood culture (routine x 2)     Status: None (Preliminary result)   Collection Time: 05/28/22 10:36 AM   Specimen: BLOOD  Result Value Ref Range Status   Specimen Description BLOOD BLOOD LEFT ARM  Final   Special Requests NONE  Final   Culture   Final    NO GROWTH 4 DAYS Performed at [facility], [address] [place], [place] [address]    Report Status PENDING  Incomplete  Blood culture (routine x 2)     Status: None (Preliminary result)   Collection Time: 05/28/22  3:20 PM   Specimen: BLOOD RIGHT ARM  Result Value Ref Range Status   Specimen Description   Final    BLOOD RIGHT ARM BOTTLES DRAWN AEROBIC AND ANAEROBIC   Special Requests Blood Culture adequate volume  Final   Culture   Final    NO GROWTH 4  DAYS Performed at [facility], [address] [place], [place] [id number]    Report Status PENDING  Incomplete  Resp panel by RT-PCR (RSV, Flu A&B, Covid) Anterior Nasal Swab     Status: None   Collection Time: 06/01/22 11:21 AM   Specimen: Anterior Nasal Swab  Result Value Ref Range Status   SARS Coronavirus 2 by RT PCR NEGATIVE NEGATIVE Final    Comment: (NOTE) SARS-CoV-2 target nucleic acids are NOT DETECTED.  The SARS-CoV-2 RNA is generally detectable in upper respiratory specimens during the acute phase of infection. The lowest concentration of SARS-CoV-2 viral copies this assay can detect is 138 copies/mL. A negative result does not preclude SARS-Cov-2 infection and should not be used as the sole basis for treatment or other patient management decisions. A negative result may occur with  improper specimen collection/handling, submission of specimen other than nasopharyngeal swab, presence of viral mutation(s) within  the areas targeted by this assay, and inadequate number of viral copies(<138 copies/mL). A negative result must be combined with clinical observations, patient history, and epidemiological information. The expected result is Negative.  Fact Sheet for Patients:  EntrepreneurPulse.com.au  Fact Sheet for Healthcare Providers:  IncredibleEmployment.be  This test is no t yet approved or cleared by the [place] FDA and  has been authorized for detection and/or diagnosis of SARS-CoV-2 by FDA under an Emergency Use Authorization (EUA). This EUA will remain  in effect (meaning this test can be used) for the duration of the COVID-19 declaration under Section 564(b)(1) of the Act, 21 U.S.C.section 360bbb-3(b)(1), unless the authorization is terminated  or revoked sooner.       Influenza A by PCR NEGATIVE NEGATIVE Final   Influenza B by PCR NEGATIVE NEGATIVE Final    Comment: (NOTE) The Xpert Xpress SARS-CoV-2/FLU/RSV plus assay is intended as an aid in the diagnosis of influenza from Nasopharyngeal swab specimens and should not be used as a sole basis for treatment. Nasal washings and aspirates are unacceptable for Xpert Xpress SARS-CoV-2/FLU/RSV testing.  Fact Sheet for Patients: EntrepreneurPulse.com.au  Fact Sheet for Healthcare Providers: IncredibleEmployment.be  This test is not yet approved or cleared by the [place] FDA and has been authorized for detection and/or diagnosis of SARS-CoV-2 by FDA under an Emergency Use Authorization (EUA). This EUA will remain in effect (meaning this test can be used) for the duration of the COVID-19 declaration under Section 564(b)(1) of the Act, 21 U.S.C. section 360bbb-3(b)(1), unless the authorization is terminated or revoked.     Resp Syncytial Virus by PCR NEGATIVE NEGATIVE Final    Comment: (NOTE) Fact Sheet for  Patients: EntrepreneurPulse.com.au  Fact Sheet for Healthcare Providers: IncredibleEmployment.be  This test is not yet approved or cleared by the [place] FDA and has been authorized for detection and/or diagnosis of SARS-CoV-2 by FDA under an Emergency Use Authorization (EUA). This EUA will remain in effect (meaning this test can be used) for the duration of the COVID-19 declaration under Section 564(b)(1) of the Act, 21 U.S.C. section 360bbb-3(b)(1), unless the authorization is terminated or revoked.  Performed at [facility], [address], [place], [place] [address]     Labs: CBC: Recent Labs  Lab 05/28/22 703 [id number] 05/29/22 0528 05/30/22 0459 05/31/22 0510 06/01/22 0510  WBC 4.7 5.8 6.8 7.8 9.4  NEUTROABS 2.9  --   --   --   --   HGB 11.0* 9.0* 9.3* 9.3* 9.5*  HCT 33.6* 26.9* 29.1* 28.7* 28.8*  MCV 77.2* 76.6* 77.6* 77.8* 77.0*  PLT 390 281 308 252 [id number]   Basic Metabolic Panel: Recent Labs  Lab 05/29/22 0528 05/30/22 0459 05/31/22 0510 06/01/22 0510 06/02/22 0425  NA 132* 136 137 134* 139  K 3.4* 2.9* 3.3* 3.2* 4.6  CL 94* 101 101 101 109  CO2 29 27 26 23 24  $ GLUCOSE 78 91 63* 112* 87  BUN 23* 17 16 14 16  $ CREATININE 0.75 0.67 0.80 0.72 0.76  CALCIUM 8.6* 8.6* 8.6* 8.2* 8.2*  MG 1.9 1.8 2.2 1.9 1.9  PHOS 2.8 2.4* 2.8 1.9* 2.2*   Liver Function Tests: Recent Labs  Lab 05/28/22 0842  AST 23  ALT 11  ALKPHOS 91  BILITOT 0.8  PROT 6.5  ALBUMIN 2.2*   CBG: Recent Labs  Lab 06/01/22 1648 06/01/22 2159 06/01/22 2346 06/02/22 0429 06/02/22 0750  GLUCAP 131* 113* 201* 187* 92    Discharge time spent: greater than 30 minutes.  Signed: Orson Eva, MD [facility] 06/02/2022

## 2022-06-02 NOTE — Progress Notes (Signed)
Pt given bed bath and pericare, bed linens changed and pt repositioned.

## 2022-06-02 NOTE — Progress Notes (Signed)
Pt refused bath and CHG wipes, also refused to have bed in low position. Advised on safety reason/preventing injury in case he falls out of bed and patient stated "I'm not going to fall!'

## 2022-06-03 DIAGNOSIS — R627 Adult failure to thrive: Secondary | ICD-10-CM | POA: Diagnosis not present

## 2022-06-03 DIAGNOSIS — C159 Malignant neoplasm of esophagus, unspecified: Secondary | ICD-10-CM | POA: Diagnosis not present

## 2022-06-03 DIAGNOSIS — J9621 Acute and chronic respiratory failure with hypoxia: Secondary | ICD-10-CM | POA: Diagnosis not present

## 2022-06-03 DIAGNOSIS — Z720 Tobacco use: Secondary | ICD-10-CM | POA: Diagnosis not present

## 2022-06-03 LAB — GLUCOSE, CAPILLARY
Glucose-Capillary: 73 mg/dL (ref 70–99)
Glucose-Capillary: 100 mg/dL — ABNORMAL HIGH (ref 70–99)
Glucose-Capillary: 112 mg/dL — ABNORMAL HIGH (ref 70–99)
Glucose-Capillary: 125 mg/dL — ABNORMAL HIGH (ref 70–99)
Glucose-Capillary: 150 mg/dL — ABNORMAL HIGH (ref 70–99)
Glucose-Capillary: 116 mg/dL — ABNORMAL HIGH (ref 70–99)

## 2022-06-03 LAB — CULTURE, BLOOD (ROUTINE X 2)
Culture: NO GROWTH
Culture: NO GROWTH
Special Requests: ADEQUATE

## 2022-06-03 LAB — C DIFFICILE (CDIFF) QUICK SCRN (NO PCR REFLEX)
C Diff antigen: NEGATIVE
C Diff interpretation: NOT DETECTED
C Diff toxin: NEGATIVE

## 2022-06-03 MED ORDER — LOPERAMIDE HCL 1 MG/7.5ML PO SUSP
2.0000 mg | ORAL | Status: DC | PRN
  Administered 2022-06-03 – 2022-06-04 (×2): 2 mg
  Filled 2022-06-03 (×2): qty 15

## 2022-06-03 MED ORDER — LOPERAMIDE HCL 2 MG PO CAPS
2.0000 mg | ORAL_CAPSULE | ORAL | Status: DC | PRN
  Filled 2022-06-03: qty 1

## 2022-06-03 MED ORDER — LOPERAMIDE HCL 2 MG PO CAPS: 2.0000 mg | ORAL_CAPSULE | ORAL | Status: DC | PRN

## 2022-06-03 MED ORDER — K PHOS MONO-SOD PHOS DI & MONO 155-852-130 MG PO TABS
500.0000 mg | ORAL_TABLET | Freq: Every day | ORAL | Status: DC
  Administered 2022-06-04 – 2022-06-05 (×2): 500 mg
  Filled 2022-06-03 (×2): qty 2

## 2022-06-03 NOTE — Progress Notes (Signed)
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  He is a resident of [facility] long-term care.  He was seen in the emergency department 1 week PTA and diagnosed with a right-sided pneumonia but refused admission at that time.  He was discharged [place] on Augmentin which reportedly was started a couple of days ago at [facility].    On the day of admission, he complained of shortness of breath and EMS was called found his SpO2 to be 76% on 3 L.  He was placed on a nonrebreather with improvement.  His ED workup reveals a worsening right-sided pneumonia with concern for abscess lung.  The CT also reveals some right medial lobe gas and fluid concerning for possible necrosis.  Patient was stabilized in the ED and admission was requested for further management.  Patient continues to insist that he be full code and wants to continue cancer treatment.  He is due for radiation treatment on 05/30/2022 and wants to be discharged prior to that time. He received 6 days of IV Unasyn.  He remained stable clinically, afebrile, hemodynamically stable.  He was weaned off oxygen.  During the hospitalization, pt continued his usual behavioral habits on intermittently refusing care, medications, and enteral feedings.   Assessment/Plan:  Aspiration Pneumonia/Necrotizing Pneumonia -2/4 CTA chest--no PE; 2.0 x 1.6 cm gas fluid collection medial RLL -continue unasyn -appreciate pulm consult -pt reports that he has been aspirating his secretions  -he is also likely  aspirating his oral consumption as well -continue ampicillin/sulbactam -aspiration precautions -PT INSISTS ON ORAL EATING DESPITE RISKS -SLP evaluation--pt refuses to follow any recommendations -Pt refusing care unless we give him a regular diet -He is high risk for more aspiration with regular diet -Pt insisting on regular diet only and verbalized understanding after being counseled on risks of recurrent aspiration, intubation, death. -received 6 days IV unasyn>>d/c to SNF with amox/clav x 22 more days   Acute on chronic respiratory failure with hypoxia -due to necrotizing pneumonia and COVID-19 -initially presented with tachypnea with oxygen saturation 76% on 3L -now stable on RA -pt states he is on 2L oxygen prn   Chronic Hypotension -pt has had soft BPs for most of his hospitalizations -continue midodrine with stable BPs   Hypokalemia Secondary to poor oral intake. Patient states that the Osmolite which was prescribed last month gives him "terrible diarrhea" so he has not been compliant with it. Dietician consult placed and patient will be started on  - Magnesium is normal - replace potassium regularly (he is supposed to be taking daily liquid potassium) - repleted    Hypophosphatemia -repleted   Stage IV B Metastatic Squamous Cell Cancer - his chemotherapy was placed on temporary hold due to severe weight loss -now on XRT at [facility] - he is followed by Dr. Delton Coombes - palliative care met with him during last admission and he remained in denial about the severity of illness and wanted to be full code/full scope care  - follow up with Dr. Delton Coombes for ongoing discussion   Frequent Falls - likely exacerbated by severe debility, malnutrition, weight  loss - fall precautions recommended - PT ordered recommending SNF    Failure to thrive  - Pt seems unable to care for self at home - pt agrees to restart enteral feeding this evening   Severe protein calorie malnutrition -  resume regular diet plus PEG tube feeds  this evening - monitor for refeeding syndrome, recheck Mg, Phos in AM, replete as needed    Tobacco  - nicotine patch ordered - counseled patient on tobacco cessation    Chronic nausea and vomiting - secondary to esophageal malignancy - treat supportively - pt insists upon po intake, will not follow medical advice   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   Objective: Vitals:   06/02/22 1946 06/02/22 2008 06/03/22 0500 06/03/22 0934  BP:  105/77    Pulse:  99    Resp:  20    Temp:  97.8 F (36.6 C)    TempSrc:  Oral    SpO2: 96% 100%  94%  Weight:   45 kg   Height:        Intake/Output Summary (Last 24 hours) at 06/03/2022 1617 Last data filed at 06/03/2022 0300 Gross per 24 hour  Intake 394.7 ml  Output 450 ml  Net -55.3 ml   Weight change: 0 kg Exam:  General:  Pt is alert, follows commands appropriately, not in acute distress HEENT: No icterus, No thrush, No neck mass, Kevin/AT Cardiovascular: RRR, S1/S2, no rubs, no gallops Respiratory: bibasilar rales. No wheeze Abdomen: Soft/+BS, non tender, non distended, no guarding Extremities: No edema, No lymphangitis, No petechiae, No rashes, no synovitis   Data Reviewed: I have personally reviewed following labs and imaging studies Basic Metabolic  Panel: Recent Labs  Lab 05/29/22 0528 05/30/22 0459 05/31/22 0510 06/01/22 0510 06/02/22 0425  NA 132* 136 137 134* 139  K 3.4* 2.9* 3.3* 3.2* 4.6  CL 94* 101 101 101 109  CO2 29 27 26 23 24  $ GLUCOSE 78 91 63* 112* 87  BUN 23* 17 16 14 16  $ CREATININE 0.75 0.67 0.80 0.72 0.76  CALCIUM 8.6* 8.6* 8.6* 8.2* 8.2*  MG 1.9 1.8 2.2 1.9 1.9  PHOS 2.8 2.4* 2.8 1.9* 2.2*   Liver Function Tests: Recent Labs  Lab 05/28/22 0842  AST 23  ALT 11  ALKPHOS 91  BILITOT 0.8  PROT 6.5  ALBUMIN 2.2*   No results for input(s): "LIPASE", "AMYLASE" in the last 168 hours. No results for input(s): "AMMONIA" in the last 168 hours. Coagulation Profile: No results for input(s): "INR", "PROTIME" in the last 168 hours. CBC: Recent Labs  Lab 05/28/22 0842 05/29/22 0528 05/30/22 0459 05/31/22 0510 06/01/22 0510  WBC 4.7 5.8 6.8 7.8 9.4  NEUTROABS 2.9  --   --   --   --   HGB 11.0* 9.0* 9.3* 9.3* 9.5*  HCT 33.6* 26.9* 29.1* 28.7* 28.8*  MCV 77.2* 76.6* 77.6* 77.8* 77.0*  PLT 390 281 308 252 346   Cardiac Enzymes: No results for input(s): "CKTOTAL", "CKMB", "CKMBINDEX", "TROPONINI" in the last 168 hours. BNP: Invalid input(s): "POCBNP" CBG: Recent Labs  Lab 06/02/22 2009 06/02/22 2357 06/03/22 0509 06/03/22 0826 06/03/22 1219  GLUCAP 93 73 100* 112* 125*   HbA1C: No results for input(s): "HGBA1C" in the last 72 hours. Urine analysis:    Component Value Date/Time   COLORURINE AMBER (A) 02/16/2022 0500   APPEARANCEUR CLOUDY (A) 02/16/2022 0500   LABSPEC >1.046 (H) 02/16/2022 0500   PHURINE 5.0 02/16/2022 0500   GLUCOSEU NEGATIVE 02/16/2022 0500   HGBUR NEGATIVE 02/16/2022 0500   BILIRUBINUR SMALL (A) 02/16/2022 0500   KETONESUR NEGATIVE 02/16/2022 0500   PROTEINUR 30 (A) 02/16/2022 0500   NITRITE NEGATIVE 02/16/2022 0500   LEUKOCYTESUR NEGATIVE 02/16/2022 0500   Sepsis Labs: @LABRCNTIP$ (procalcitonin:4,lacticidven:4) ) Recent Results (from the past 240 hour(s))  Blood  culture (routine x 2)     Status: None   Collection Time: 05/28/22 10:36 AM   Specimen: BLOOD  Result Value Ref Range Status   Specimen Description BLOOD BLOOD LEFT ARM  Final   Special Requests NONE  Final   Culture   Final    NO GROWTH 6 DAYS Performed at [facility], [address], [place], [place] [address]    Report Status 06/03/2022 FINAL  Final  Blood culture (routine x 2)     Status: None   Collection Time: 05/28/22  3:20 PM   Specimen: BLOOD RIGHT ARM  Result Value Ref Range Status   Specimen Description   Final    BLOOD RIGHT ARM BOTTLES DRAWN AEROBIC AND ANAEROBIC   Special Requests Blood Culture adequate volume  Final   Culture   Final    NO GROWTH 6 DAYS Performed at [facility], [address], [place], [place] [address]    Report Status 06/03/2022 FINAL  Final  Resp panel by RT-PCR (RSV, Flu A&B, Covid) Anterior Nasal Swab     Status: None   Collection Time: 06/01/22 11:21 AM   Specimen: Anterior Nasal Swab  Result Value Ref Range Status   SARS Coronavirus 2 by RT PCR NEGATIVE NEGATIVE Final    Comment: (NOTE) SARS-CoV-2 target nucleic acids are NOT DETECTED.  The SARS-CoV-2 RNA is generally detectable in upper respiratory specimens during the acute phase of infection. The lowest concentration of SARS-CoV-2 viral copies this assay can detect is 138 copies/mL. A negative result does not preclude SARS-Cov-2 infection and should not be used as the sole basis for treatment or other patient management decisions. A negative result may occur with  improper specimen collection/handling, submission of specimen other than nasopharyngeal swab, presence of viral mutation(s) within the areas targeted by this assay, and inadequate number of viral copies(<138 copies/mL). A negative result must be combined with clinical observations, patient history, and epidemiological information. The expected result is Negative.  Fact Sheet for Patients:   EntrepreneurPulse.com.au  Fact Sheet for Healthcare Providers:  IncredibleEmployment.be  This test is no t yet approved or cleared by the [place] FDA and  has been authorized for detection and/or diagnosis of SARS-CoV-2 by FDA under an Emergency Use Authorization (EUA). This EUA will remain  in effect (meaning this test can be used) for the duration of the COVID-19 declaration under Section 564(b)(1) of the Act, 21 U.S.C.section 360bbb-3(b)(1), unless the authorization is terminated  or revoked sooner.       Influenza A by PCR NEGATIVE NEGATIVE Final   Influenza  B by PCR NEGATIVE NEGATIVE Final    Comment: (NOTE) The Xpert Xpress SARS-CoV-2/FLU/RSV plus assay is intended as an aid in the diagnosis of influenza from Nasopharyngeal swab specimens and should not be used as a sole basis for treatment. Nasal washings and aspirates are unacceptable for Xpert Xpress SARS-CoV-2/FLU/RSV testing.  Fact Sheet for Patients: EntrepreneurPulse.com.au  Fact Sheet for Healthcare Providers: IncredibleEmployment.be  This test is not yet approved or cleared by the [place] FDA and has been authorized for detection and/or diagnosis of SARS-CoV-2 by FDA under an Emergency Use Authorization (EUA). This EUA will remain in effect (meaning this test can be used) for the duration of the COVID-19 declaration under Section 564(b)(1) of the Act, 21 U.S.C. section 360bbb-3(b)(1), unless the authorization is terminated or revoked.     Resp Syncytial Virus by PCR NEGATIVE NEGATIVE Final    Comment: (NOTE) Fact Sheet for Patients: EntrepreneurPulse.com.au  Fact Sheet for Healthcare Providers: IncredibleEmployment.be  This test is not yet approved or cleared by the [place] FDA and has been authorized for detection and/or diagnosis of SARS-CoV-2 by FDA under an Emergency Use  Authorization (EUA). This EUA will remain in effect (meaning this test can be used) for the duration of the COVID-19 declaration under Section 564(b)(1) of the Act, 21 U.S.C. section 360bbb-3(b)(1), unless the authorization is terminated or revoked.  Performed at [facility], [address] [place], [place] [address]   C Difficile Quick Screen (NO PCR Reflex)     Status: None   Collection Time: 06/03/22  2:47 PM   Specimen: STOOL  Result Value Ref Range Status   C Diff antigen NEGATIVE NEGATIVE Final   C Diff toxin NEGATIVE NEGATIVE Final   C Diff interpretation No C. difficile detected.  Final    Comment: Performed at [facility], [address], [place], [place] [address]     Scheduled Meds:  allopurinol  300 mg Per Tube Daily   Chlorhexidine Gluconate Cloth  6 each Topical Daily   famotidine  20 mg Per Tube Daily   folic acid  1 mg Per Tube Daily   free water  200 mL Per Tube Q8H   gabapentin  300 mg Per Tube QHS   ipratropium-albuterol  3 mL Nebulization BID   magnesium oxide  400 mg Per Tube BID   megestrol  400 mg Per Tube BID   midodrine  5 mg Per Tube TID   multivitamin with minerals  1 tablet Per Tube Daily   mouth rinse  15 mL Mouth Rinse 4 times per day   phosphorus  500 mg Per Tube BID   rivaroxaban  10 mg Oral Q supper   Continuous Infusions:  ampicillin-sulbactam (UNASYN) IV 1.5 g (06/03/22 1058)   feeding supplement (JEVITY 1.2 CAL) Stopped (06/02/22 1132)    Procedures/Studies: CT Angio Chest PE W and/or Wo Contrast  Result Date: 05/28/2022 CLINICAL DATA:  Shortness of breath.Abdominal pain. History of esophageal cancer. * Tracking Code: BO * EXAM: CT ANGIOGRAPHY CHEST CT ABDOMEN AND PELVIS WITH CONTRAST TECHNIQUE: Multidetector CT imaging of the chest was performed using the standard protocol during bolus administration of intravenous contrast. Multiplanar CT image reconstructions and MIPs were obtained to evaluate the vascular anatomy. Multidetector CT  imaging of the abdomen and pelvis was performed using the standard protocol during bolus administration of intravenous contrast. RADIATION DOSE REDUCTION: This exam was performed according to the departmental dose-optimization program which includes automated exposure control, adjustment of the mA and/or kV  according to patient size and/or use of iterative reconstruction technique. CONTRAST:  13m OMNIPAQUE IOHEXOL 350 MG/ML SOLN COMPARISON:  01/31/2022 FINDINGS: CTA CHEST FINDINGS Cardiovascular: The heart size is normal. No substantial pericardial effusion. No thoracic aortic aneurysm. There is no filling defect in the opacified pulmonary arteries to suggest the presence of an acute pulmonary embolus. Left Port-A-Cath tip is positioned in the distal SVC. Mediastinum/Nodes: Diffusely increased attenuation of mediastinal fat may reflect edema. No mediastinal lymphadenopathy. There is no hilar lymphadenopathy. Proximal esophagus is patulous with food debris in the proximal and mid segment compatible with the known neoplasm and similar to prior. There is no axillary lymphadenopathy. Lungs/Pleura: Circumferential bronchial wall thickening is noted in both lungs, right greater than left. There is extensive tree-in-bud nodularity in the right lung and left lower lobe with areas of consolidative airspace disease in the right upper lobe measuring 2.7 x 1.3 cm. Patchy confluent airspace disease is superimposed on the nodularity in the right middle lobe. There is complete collapse of the right lower lobe with obliteration of the bronchus intermedius. 2.0 x 1.6 cm collection of gas and fluid in the medial right lower lobe may represent an area of necrosis with fistula formation or abscess. Small right pleural effusion evident. Musculoskeletal: Multiple healed right rib fractures noted. There also multiple healed fractures on the left. Review of the MIP images confirms the above findings. CT ABDOMEN and PELVIS FINDINGS  Hepatobiliary: Tiny hypodensity in the medial segment left liver is stable in the interval, likely benign. Gallbladder not well visualized. No intrahepatic or extrahepatic biliary dilation. Pancreas: No focal mass lesion. No dilatation of the main duct. No intraparenchymal cyst. No peripancreatic edema. Spleen: No splenomegaly. No focal mass lesion. Adrenals/Urinary Tract: No adrenal nodule or mass. Tiny hypodensities in both kidneys are too small to characterize, similar to prior likely benign. No hydroureter. Bladder unremarkable. Stomach/Bowel: Stomach is nondistended. Gastrostomy tube evident. Scattered mildly distended fluid-filled small bowel loops in the abdomen and pelvis measuring up to 3.4 cm diameter. Neither the terminal ileum nor the appendix are discretely visible. Gas and fluid is scattered along the length of the mildly distended colon with gas and fluid visible in the rectum. This appearance can be seen in the setting of diarrhea. Vascular/Lymphatic: There is mild atherosclerotic calcification of the abdominal aorta without aneurysm. There is no gastrohepatic or hepatoduodenal ligament lymphadenopathy. No retroperitoneal or mesenteric lymphadenopathy. No pelvic sidewall lymphadenopathy. Assessment for lymphadenopathy is limited by patient cachexia and diffuse edema soft tissues. Reproductive: The prostate gland and seminal vesicles are unremarkable. Other: Probable small volume intraperitoneal free fluid. Diffuse edema of mesentery and extraperitoneal tissues. Musculoskeletal: Bone windows reveal no worrisome lytic or sclerotic osseous lesions. Mild wedge compression deformity is seen at T4 and T7, similar to prior. Diffuse body wall edema evident. Review of the MIP images confirms the above findings. IMPRESSION: 1. No CT evidence for acute pulmonary embolus. 2. Marked progression of extensive tree-in-bud nodularity in both lungs, right greater than left, with areas of consolidative airspace disease  in the right upper lobe and right middle lobe. Imaging features are compatible with an infectious/inflammatory etiology. Aspiration not excluded. 3. Complete collapse of the right lower lobe with obliteration of the bronchus intermedius. 2.0 x 1.6 cm collection of gas and fluid in the medial right lower lobe is progressive in the interval and may represent an area of necrosis with fistula formation or abscess. 4. Small right pleural effusion. 5. Scattered mildly distended fluid-filled small bowel loops in  the abdomen and pelvis measuring up to 3.4 cm diameter. Gas and fluid is scattered along the length of the mildly distended colon with gas and fluid visible in the rectum. This appearance can be seen in the setting of diarrhea. Generally, imaging features suggest component of underlying dysmotility/ileus 6. Marked cachexia with diffuse edema of the mesentery, extraperitoneal fat, and body wall. 7. Patulous proximal and mid esophagus with debris in the lumen, compatible with the patient's known esophageal neoplasm. Electronically Signed   By: Misty Stanley M.D.   On: 05/28/2022 12:49   CT ABDOMEN PELVIS W CONTRAST  Result Date: 05/28/2022 CLINICAL DATA:  Shortness of breath.Abdominal pain. History of esophageal cancer. * Tracking Code: BO * EXAM: CT ANGIOGRAPHY CHEST CT ABDOMEN AND PELVIS WITH CONTRAST TECHNIQUE: Multidetector CT imaging of the chest was performed using the standard protocol during bolus administration of intravenous contrast. Multiplanar CT image reconstructions and MIPs were obtained to evaluate the vascular anatomy. Multidetector CT imaging of the abdomen and pelvis was performed using the standard protocol during bolus administration of intravenous contrast. RADIATION DOSE REDUCTION: This exam was performed according to the departmental dose-optimization program which includes automated exposure control, adjustment of the mA and/or kV according to patient size and/or use of iterative  reconstruction technique. CONTRAST:  145m OMNIPAQUE IOHEXOL 350 MG/ML SOLN COMPARISON:  01/31/2022 FINDINGS: CTA CHEST FINDINGS Cardiovascular: The heart size is normal. No substantial pericardial effusion. No thoracic aortic aneurysm. There is no filling defect in the opacified pulmonary arteries to suggest the presence of an acute pulmonary embolus. Left Port-A-Cath tip is positioned in the distal SVC. Mediastinum/Nodes: Diffusely increased attenuation of mediastinal fat may reflect edema. No mediastinal lymphadenopathy. There is no hilar lymphadenopathy. Proximal esophagus is patulous with food debris in the proximal and mid segment compatible with the known neoplasm and similar to prior. There is no axillary lymphadenopathy. Lungs/Pleura: Circumferential bronchial wall thickening is noted in both lungs, right greater than left. There is extensive tree-in-bud nodularity in the right lung and left lower lobe with areas of consolidative airspace disease in the right upper lobe measuring 2.7 x 1.3 cm. Patchy confluent airspace disease is superimposed on the nodularity in the right middle lobe. There is complete collapse of the right lower lobe with obliteration of the bronchus intermedius. 2.0 x 1.6 cm collection of gas and fluid in the medial right lower lobe may represent an area of necrosis with fistula formation or abscess. Small right pleural effusion evident. Musculoskeletal: Multiple healed right rib fractures noted. There also multiple healed fractures on the left. Review of the MIP images confirms the above findings. CT ABDOMEN and PELVIS FINDINGS Hepatobiliary: Tiny hypodensity in the medial segment left liver is stable in the interval, likely benign. Gallbladder not well visualized. No intrahepatic or extrahepatic biliary dilation. Pancreas: No focal mass lesion. No dilatation of the main duct. No intraparenchymal cyst. No peripancreatic edema. Spleen: No splenomegaly. No focal mass lesion.  Adrenals/Urinary Tract: No adrenal nodule or mass. Tiny hypodensities in both kidneys are too small to characterize, similar to prior likely benign. No hydroureter. Bladder unremarkable. Stomach/Bowel: Stomach is nondistended. Gastrostomy tube evident. Scattered mildly distended fluid-filled small bowel loops in the abdomen and pelvis measuring up to 3.4 cm diameter. Neither the terminal ileum nor the appendix are discretely visible. Gas and fluid is scattered along the length of the mildly distended colon with gas and fluid visible in the rectum. This appearance can be seen in the setting of diarrhea. Vascular/Lymphatic: There is mild  atherosclerotic calcification of the abdominal aorta without aneurysm. There is no gastrohepatic or hepatoduodenal ligament lymphadenopathy. No retroperitoneal or mesenteric lymphadenopathy. No pelvic sidewall lymphadenopathy. Assessment for lymphadenopathy is limited by patient cachexia and diffuse edema soft tissues. Reproductive: The prostate gland and seminal vesicles are unremarkable. Other: Probable small volume intraperitoneal free fluid. Diffuse edema of mesentery and extraperitoneal tissues. Musculoskeletal: Bone windows reveal no worrisome lytic or sclerotic osseous lesions. Mild wedge compression deformity is seen at T4 and T7, similar to prior. Diffuse body wall edema evident. Review of the MIP images confirms the above findings. IMPRESSION: 1. No CT evidence for acute pulmonary embolus. 2. Marked progression of extensive tree-in-bud nodularity in both lungs, right greater than left, with areas of consolidative airspace disease in the right upper lobe and right middle lobe. Imaging features are compatible with an infectious/inflammatory etiology. Aspiration not excluded. 3. Complete collapse of the right lower lobe with obliteration of the bronchus intermedius. 2.0 x 1.6 cm collection of gas and fluid in the medial right lower lobe is progressive in the interval and may  represent an area of necrosis with fistula formation or abscess. 4. Small right pleural effusion. 5. Scattered mildly distended fluid-filled small bowel loops in the abdomen and pelvis measuring up to 3.4 cm diameter. Gas and fluid is scattered along the length of the mildly distended colon with gas and fluid visible in the rectum. This appearance can be seen in the setting of diarrhea. Generally, imaging features suggest component of underlying dysmotility/ileus 6. Marked cachexia with diffuse edema of the mesentery, extraperitoneal fat, and body wall. 7. Patulous proximal and mid esophagus with debris in the lumen, compatible with the patient's known esophageal neoplasm. Electronically Signed   By: Misty Stanley M.D.   On: 05/28/2022 12:49   DG Chest Portable 1 View  Result Date: 05/28/2022 CLINICAL DATA:  Shortness of breath. EXAM: PORTABLE CHEST 1 VIEW COMPARISON:  05/22/2022 FINDINGS: Lungs are hyperexpanded. Left lung clear. Increasing consolidative opacity at the right base with patchy airspace disease in the right mid and upper lung similar to prior. The cardio pericardial silhouette is enlarged. Left Port-A-Cath tip overlies the distal SVC. Telemetry leads overlie the chest. IMPRESSION: Increasing consolidative opacity at the right base with patchy airspace disease in the right mid and upper lung. Electronically Signed   By: Misty Stanley M.D.   On: 05/28/2022 09:05   DG Chest 1 View  Result Date: 05/22/2022 CLINICAL DATA:  Cough. EXAM: CHEST  1 VIEW COMPARISON:  March 02, 2022 FINDINGS: The heart size and mediastinal contours are stable. Right central venous line is unchanged. Patchy consolidation identified throughout the right lung. The left lung is clear. The visualized skeletal structures are stable. IMPRESSION: Pneumonia throughout the right lung. Electronically Signed   By: Abelardo Diesel M.D.   On: 05/22/2022 16:53    Orson Eva, DO  [facility]  If 7PM-7AM, please contact  night-coverage [email or web address] Password [facility] 06/03/2022, 4:17 PM   LOS: 6 days

## 2022-06-03 NOTE — TOC Progression Note (Signed)
Transition of Care [facility]) - Progression Note    Patient Details  Name: Trevor [name] MRN: [id number] Date of Birth: [birth year]  Transition [facility]) CM/SW [place], [place] Phone Number: 06/03/2022, 10:26 AM  Clinical Narrative:    CSW spoke to [name] with [facility] who states they do not have a bed for pt this weekend. Jackelyn Poling states they will have a bed open for pt on Monday and he can D/C to facility then. TOC to follow.   Expected Discharge Plan: Long Term Nursing Home Barriers to Discharge: Continued Medical Work up  Expected Discharge Plan and Services In-house Referral: Clinical Social Work     Living arrangements for the past 2 months: [place] Expected Discharge Date: 06/02/22                                     Social Determinants of Health (SDOH) Interventions SDOH Screenings   Food Insecurity: No Food Insecurity (05/28/2022)  Housing: Low Risk  (05/28/2022)  Transportation Needs: No Transportation Needs (05/28/2022)  Utilities: Not At Risk (05/28/2022)  Depression (PHQ2-9): Low Risk  (02/07/2022)  Financial Resource Strain: High Risk (09/28/2021)  Stress: No Stress Concern Present (02/07/2022)  Tobacco Use: High Risk (05/29/2022)    Readmission Risk Interventions    05/29/2022   12:43 PM 03/03/2022   11:46 AM 01/24/2022    3:13 PM  Readmission Risk Prevention Plan  Transportation Screening Complete Complete Complete  PCP or Specialist Appt within 3-5 Days   Complete  HRI or [place]   Complete  Social Work Consult for [place] Planning/Counseling   Complete  Palliative Care Screening   Complete  Medication Review Press photographer) Complete Complete Complete  HRI or Home Care Consult Complete Complete   SW Recovery Care/Counseling Consult Complete Complete   Palliative Care Screening Complete Complete   Skilled Nursing Facility Complete Patient Refused

## 2022-06-04 DIAGNOSIS — J189 Pneumonia, unspecified organism: Secondary | ICD-10-CM | POA: Diagnosis not present

## 2022-06-04 DIAGNOSIS — J9621 Acute and chronic respiratory failure with hypoxia: Secondary | ICD-10-CM | POA: Diagnosis not present

## 2022-06-04 DIAGNOSIS — J984 Other disorders of lung: Secondary | ICD-10-CM | POA: Diagnosis not present

## 2022-06-04 LAB — BASIC METABOLIC PANEL
Anion gap: 6 (ref 5–15)
BUN: 14 mg/dL (ref 6–20)
CO2: 21 mmol/L — ABNORMAL LOW (ref 22–32)
Calcium: 7.8 mg/dL — ABNORMAL LOW (ref 8.9–10.3)
Chloride: 108 mmol/L (ref 98–111)
Creatinine, Ser: 0.78 mg/dL (ref 0.61–1.24)
GFR, Estimated: >60 mL/min (ref >60)
Glucose, Bld: 101 mg/dL — ABNORMAL HIGH (ref 70–99)
Potassium: 3.1 mmol/L — ABNORMAL LOW (ref 3.5–5.1)
Sodium: 135 mmol/L (ref 135–145)

## 2022-06-04 LAB — GLUCOSE, CAPILLARY
Glucose-Capillary: 102 mg/dL — ABNORMAL HIGH (ref 70–99)
Glucose-Capillary: 111 mg/dL — ABNORMAL HIGH (ref 70–99)
Glucose-Capillary: 143 mg/dL — ABNORMAL HIGH (ref 70–99)
Glucose-Capillary: 159 mg/dL — ABNORMAL HIGH (ref 70–99)
Glucose-Capillary: 361 mg/dL — ABNORMAL HIGH (ref 70–99)
Glucose-Capillary: 85 mg/dL (ref 70–99)
Glucose-Capillary: 97 mg/dL (ref 70–99)

## 2022-06-04 LAB — MAGNESIUM: Magnesium: 1.7 mg/dL (ref 1.7–2.4)

## 2022-06-04 LAB — PHOSPHORUS: Phosphorus: 2.6 mg/dL (ref 2.5–4.6)

## 2022-06-04 MED ORDER — POTASSIUM CHLORIDE 10 MEQ/100ML IV SOLN
10.0000 meq | INTRAVENOUS | Status: AC
  Administered 2022-06-04 (×2): 10 meq via INTRAVENOUS
  Filled 2022-06-04 (×2): qty 100

## 2022-06-04 MED ORDER — SODIUM CHLORIDE 0.9 % IV BOLUS
1000.0000 mL | Freq: Once | INTRAVENOUS | Status: AC
  Administered 2022-06-04: 1000 mL via INTRAVENOUS

## 2022-06-04 MED ORDER — GUAIFENESIN 100 MG/5ML PO LIQD
5.0000 mL | ORAL | Status: DC | PRN
  Filled 2022-06-04: qty 5

## 2022-06-04 NOTE — Progress Notes (Signed)
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  He is a resident of [facility] long-term care.  He was seen in the emergency department 1 week PTA and diagnosed with a right-sided pneumonia but refused admission at that time.  He was discharged [place] on Augmentin which reportedly was started a couple of days ago at [facility].    On the day of admission, he complained of shortness of breath and EMS was called found his SpO2 to be 76% on 3 L.  He was placed on a nonrebreather with improvement.  His ED workup reveals a worsening right-sided pneumonia with concern for abscess lung.  The CT also reveals some right medial lobe gas and fluid concerning for possible necrosis.  Patient was stabilized in the ED and admission was requested for further management.  Patient continues to insist that he be full code and wants to continue cancer treatment.  He is due for radiation treatment on 05/30/2022 and wants to be discharged prior to that time. He received 6 days of IV Unasyn.  He remained stable clinically, afebrile, hemodynamically stable.  He was weaned off oxygen.  During the hospitalization, pt continued his usual behavioral habits on intermittently refusing care, medications, and enteral feedings.   Assessment/Plan:  Aspiration Pneumonia/Necrotizing Pneumonia -2/4 CTA chest--no PE; 2.0 x 1.6 cm gas fluid collection medial RLL -continue unasyn -appreciate pulm consult -pt reports that he has been aspirating his secretions  -he is also likely  aspirating his oral consumption as well -continue ampicillin/sulbactam -aspiration precautions -PT INSISTS ON ORAL EATING DESPITE RISKS -SLP evaluation--pt refuses to follow any recommendations -Pt refusing care unless we give him a regular diet -he continues to frequently refuse care, meds, TF -He is high risk for more aspiration with regular diet -Pt insisting on regular diet only and verbalized understanding after being counseled on risks of recurrent aspiration, intubation, death. -pt has capacity to make decisions -received 6 days IV unasyn>>d/c to SNF with amox/clav x 22 more days   Acute on chronic respiratory failure with hypoxia -due to necrotizing pneumonia and COVID-19 -initially presented with tachypnea with oxygen saturation 76% on 3L -now stable on RA -pt states he is on 2L oxygen prn -repeat COVID 19-- neg on 2/8   Chronic Hypotension -pt has had soft BPs for most of his hospitalizations -continue midodrine with stable BPs   Hypokalemia Secondary to poor oral intake. Patient states that the Osmolite which was prescribed last month gives him "terrible diarrhea" so he has not been compliant with it. Dietician consult placed and patient will be started on  - Magnesium is normal - replace potassium regularly (he is supposed to be taking daily liquid potassium) - repleted    Hypophosphatemia -repleted   Stage IV B Metastatic Squamous Cell Cancer - his chemotherapy was placed on temporary hold due to severe weight loss -now on XRT at [facility] - he is followed by Dr. Delton Coombes - palliative care met with him during last admission and he remained in denial about the severity of illness and wanted to be full code/full scope care  -  follow up with Dr. Delton Coombes for ongoing discussion   Frequent Falls - likely exacerbated by severe debility, malnutrition, weight loss - fall precautions recommended - PT ordered recommending SNF    Failure to thrive  - Pt seems unable to  care for self at home - he continues to refuse continuous enteral feeding    Severe protein calorie malnutrition - resume regular diet plus PEG tube feeds  this evening - monitor for refeeding syndrome, recheck Mg, Phos in AM, replete as needed    Tobacco  - nicotine patch ordered - counseled patient on tobacco cessation    Chronic nausea and vomiting - secondary to esophageal malignancy - treat supportively - pt insists upon po intake, will not follow medical advice   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  States sob over all is improving.  Denies frank vomiting, but continues to "spit up".  Continues to have loose stool  Objective: Vitals:   06/04/22 0512 06/04/22 1058 06/04/22 1108 06/04/22 1641  BP: 118/87  114/84 106/83  Pulse: 100  (!) 109 (!) 101  Resp: 19  18 19  $ Temp: 98.7 F (37.1 C)  98.7 F (37.1 C) 98.5 F (36.9 C)  TempSrc: Oral  Oral Oral  SpO2: 100% 96% 100% 100%  Weight:      Height:        Intake/Output Summary (Last 24 hours) at 06/04/2022 1709 Last data filed at 06/04/2022 1400 Gross per 24 hour  Intake 2236.05 ml  Output 650 ml  Net 1586.05 ml   Weight change:  Exam:  General:  Pt is alert, follows commands appropriately, not in acute distress HEENT: No icterus, No thrush, No neck mass, [place]/AT Cardiovascular: RRR, S1/S2, no rubs, no gallops Respiratory: bibasilar rales. No wheeze Abdomen: Soft/+BS, non tender, non  distended, no guarding Extremities: No edema, No lymphangitis, No petechiae, No rashes, no synovitis   Data Reviewed: I have personally reviewed following labs and imaging studies Basic Metabolic Panel: Recent Labs  Lab 05/30/22 0459 05/31/22 0510 06/01/22 0510 06/02/22 0425 06/04/22 0520  NA 136 137 134* 139 135  K 2.9* 3.3* 3.2* 4.6 3.1*  CL 101 101 101 109 108  CO2 27 26 23 24 $ 21*  GLUCOSE 91 63* 112* 87 101*  BUN 17 16 14 16 14  $ CREATININE 0.67 0.80 0.72 0.76 0.78  CALCIUM 8.6* 8.6* 8.2* 8.2* 7.8*  MG 1.8 2.2 1.9 1.9 1.7  PHOS 2.4* 2.8 1.9* 2.2* 2.6   Liver Function Tests: No results for input(s): "AST", "ALT", "ALKPHOS", "BILITOT", "PROT", "ALBUMIN" in the last 168 hours. No results for input(s): "LIPASE", "AMYLASE" in the last 168 hours. No results for input(s): "AMMONIA" in the last 168 hours. Coagulation Profile: No results for input(s): "INR", "PROTIME" in the last 168 hours. CBC: Recent Labs  Lab 05/29/22 0528 05/30/22 0459 05/31/22 0510 06/01/22 0510  WBC 5.8 6.8  7.8 9.4  HGB 9.0* 9.3* 9.3* 9.5*  HCT 26.9* 29.1* 28.7* 28.8*  MCV 76.6* 77.6* 77.8* 77.0*  PLT 281 308 252 346   Cardiac Enzymes: No results for input(s): "CKTOTAL", "CKMB", "CKMBINDEX", "TROPONINI" in the last 168 hours. BNP: Invalid input(s): "POCBNP" CBG: Recent Labs  Lab 06/04/22 0007 06/04/22 0449 06/04/22 0757 06/04/22 1206 06/04/22 1636  GLUCAP 143* 159* 102* 361* 97   HbA1C: No results for input(s): "HGBA1C" in the last 72 hours. Urine analysis:    Component Value Date/Time   COLORURINE AMBER (A) 02/16/2022 0500   APPEARANCEUR CLOUDY (A) 02/16/2022 0500   LABSPEC >1.046 (H) 02/16/2022 0500   PHURINE 5.0 02/16/2022 0500   GLUCOSEU NEGATIVE 02/16/2022 0500   HGBUR NEGATIVE 02/16/2022 0500   BILIRUBINUR SMALL (A) 02/16/2022 0500   KETONESUR NEGATIVE 02/16/2022 0500   PROTEINUR 30 (A) 02/16/2022 0500   NITRITE NEGATIVE 02/16/2022 0500   LEUKOCYTESUR NEGATIVE 02/16/2022  0500   Sepsis Labs: @LABRCNTIP$ (procalcitonin:4,lacticidven:4) ) Recent Results (from the past 240 hour(s))  Blood culture (routine x 2)     Status: None   Collection Time: 05/28/22 10:36 AM   Specimen: BLOOD  Result Value Ref Range Status   Specimen Description BLOOD BLOOD LEFT ARM  Final   Special Requests NONE  Final   Culture   Final    NO GROWTH 6 DAYS Performed at [facility], [address], [place], [place] [address]    Report Status 06/03/2022 FINAL  Final  Blood culture (routine x 2)     Status: None   Collection Time: 05/28/22  3:20 PM   Specimen: BLOOD RIGHT ARM  Result Value Ref Range Status   Specimen Description   Final    BLOOD RIGHT ARM BOTTLES DRAWN AEROBIC AND ANAEROBIC   Special Requests Blood Culture adequate volume  Final   Culture   Final    NO GROWTH 6 DAYS Performed at [facility], [address], [place], [place] [address]    Report Status 06/03/2022 FINAL  Final  Resp panel by RT-PCR (RSV, Flu A&B, Covid) Anterior Nasal Swab     Status: None   Collection Time: 06/01/22 11:21 AM   Specimen: Anterior Nasal Swab  Result Value Ref Range Status   SARS Coronavirus 2 by RT PCR NEGATIVE NEGATIVE Final    Comment: (NOTE) SARS-CoV-2 target nucleic acids are NOT DETECTED.  The SARS-CoV-2 RNA is generally detectable in upper respiratory specimens during the acute phase of infection. The lowest concentration of SARS-CoV-2 viral copies this assay can detect is 138 copies/mL. A negative result does not preclude SARS-Cov-2 infection and should not be used as the sole basis for treatment or other patient management decisions. A negative result may occur with  improper specimen collection/handling, submission of specimen other than nasopharyngeal swab, presence of viral mutation(s) within the areas targeted by this assay, and inadequate number of viral copies(<138 copies/mL). A negative result must be combined with clinical observations, patient history, and  epidemiological information. The expected result is Negative.  Fact Sheet for Patients:  EntrepreneurPulse.com.au  Fact Sheet for Healthcare Providers:  IncredibleEmployment.be  This test is no t yet approved or cleared by the [place] FDA and  has been authorized for detection and/or diagnosis of SARS-CoV-2 by FDA under an Emergency Use Authorization (EUA). This EUA will remain  in effect (meaning this test can be used) for the duration of the COVID-19 declaration under Section 564(b)(1) of the Act, 21 U.S.C.section 360bbb-3(b)(1), unless the authorization is terminated  or revoked sooner.       Influenza A by PCR NEGATIVE NEGATIVE Final   Influenza B by PCR NEGATIVE NEGATIVE Final    Comment: (NOTE) The Xpert Xpress SARS-CoV-2/FLU/RSV plus assay is intended as an aid in the diagnosis of influenza from Nasopharyngeal swab specimens and should not be used as a sole basis for treatment. Nasal washings and aspirates are unacceptable for Xpert Xpress SARS-CoV-2/FLU/RSV testing.  Fact Sheet for Patients: EntrepreneurPulse.com.au  Fact Sheet for Healthcare Providers: IncredibleEmployment.be  This test is not yet approved or cleared by the [place] FDA and has been authorized for detection and/or diagnosis of SARS-CoV-2 by FDA under an Emergency Use Authorization (EUA). This EUA will remain in effect (meaning this test can be used) for the duration of the COVID-19 declaration under Section 564(b)(1) of the Act, 21 U.S.C. section 360bbb-3(b)(1), unless the authorization is terminated or revoked.     Resp Syncytial Virus by PCR NEGATIVE NEGATIVE Final    Comment: (NOTE) Fact Sheet for Patients: EntrepreneurPulse.com.au  Fact Sheet for Healthcare Providers: IncredibleEmployment.be  This test is not yet approved or cleared by the [place] FDA and has been  authorized for detection and/or diagnosis of SARS-CoV-2 by FDA under an Emergency Use Authorization (EUA). This EUA will remain in effect (meaning this test can be used) for the duration of the COVID-19 declaration under Section 564(b)(1) of the Act, 21 U.S.C. section 360bbb-3(b)(1), unless the authorization is terminated or revoked.  Performed at [facility], [address] [place], [place] [address]   C Difficile Quick Screen (NO PCR Reflex)     Status: None   Collection Time: 06/03/22  2:47 PM   Specimen: STOOL  Result Value Ref Range Status   C Diff antigen NEGATIVE NEGATIVE Final   C Diff toxin NEGATIVE NEGATIVE Final   C Diff interpretation No C. difficile detected.  Final    Comment: Performed at [facility], [address], [place], [place] [address]     Scheduled Meds:  allopurinol  300 mg Per Tube Daily   Chlorhexidine Gluconate Cloth  6 each Topical Daily   famotidine  20 mg Per Tube Daily   folic acid  1 mg Per Tube Daily   free water  200 mL Per Tube Q8H   gabapentin  300 mg Per Tube QHS   ipratropium-albuterol  3 mL Nebulization BID   magnesium oxide  400 mg Per Tube BID   megestrol  400 mg Per Tube BID   midodrine  5 mg Per Tube TID   multivitamin with minerals  1 tablet Per Tube Daily   mouth rinse  15 mL Mouth Rinse 4 times per day   phosphorus  500 mg Per Tube Daily   rivaroxaban  10 mg Oral Q supper   Continuous Infusions:  ampicillin-sulbactam (UNASYN) IV 1.5 g (06/04/22 1644)   feeding supplement (JEVITY 1.2 CAL) Stopped (06/02/22 1132)   potassium chloride     sodium chloride      Procedures/Studies: CT Angio Chest PE W and/or Wo Contrast  Result Date: 05/28/2022 CLINICAL DATA:  Shortness of breath.Abdominal pain. History of esophageal cancer. * Tracking Code: BO * EXAM: CT ANGIOGRAPHY CHEST CT ABDOMEN AND PELVIS WITH CONTRAST TECHNIQUE: Multidetector CT imaging of the chest was performed using the standard protocol during bolus administration of  intravenous contrast. Multiplanar CT image reconstructions and MIPs were obtained to evaluate the vascular anatomy. Multidetector CT imaging of the abdomen and pelvis was performed using the standard  protocol during bolus administration of intravenous contrast. RADIATION DOSE REDUCTION: This exam was performed according to the departmental dose-optimization program which includes automated exposure control, adjustment of the mA and/or kV according to patient size and/or use of iterative reconstruction technique. CONTRAST:  156m OMNIPAQUE IOHEXOL 350 MG/ML SOLN COMPARISON:  01/31/2022 FINDINGS: CTA CHEST FINDINGS Cardiovascular: The heart size is normal. No substantial pericardial effusion. No thoracic aortic aneurysm. There is no filling defect in the opacified pulmonary arteries to suggest the presence of an acute pulmonary embolus. Left Port-A-Cath tip is positioned in the distal SVC. Mediastinum/Nodes: Diffusely increased attenuation of mediastinal fat may reflect edema. No mediastinal lymphadenopathy. There is no hilar lymphadenopathy. Proximal esophagus is patulous with food debris in the proximal and mid segment compatible with the known neoplasm and similar to prior. There is no axillary lymphadenopathy. Lungs/Pleura: Circumferential bronchial wall thickening is noted in both lungs, right greater than left. There is extensive tree-in-bud nodularity in the right lung and left lower lobe with areas of consolidative airspace disease in the right upper lobe measuring 2.7 x 1.3 cm. Patchy confluent airspace disease is superimposed on the nodularity in the right middle lobe. There is complete collapse of the right lower lobe with obliteration of the bronchus intermedius. 2.0 x 1.6 cm collection of gas and fluid in the medial right lower lobe may represent an area of necrosis with fistula formation or abscess. Small right pleural effusion evident. Musculoskeletal: Multiple healed right rib fractures noted. There  also multiple healed fractures on the left. Review of the MIP images confirms the above findings. CT ABDOMEN and PELVIS FINDINGS Hepatobiliary: Tiny hypodensity in the medial segment left liver is stable in the interval, likely benign. Gallbladder not well visualized. No intrahepatic or extrahepatic biliary dilation. Pancreas: No focal mass lesion. No dilatation of the main duct. No intraparenchymal cyst. No peripancreatic edema. Spleen: No splenomegaly. No focal mass lesion. Adrenals/Urinary Tract: No adrenal nodule or mass. Tiny hypodensities in both kidneys are too small to characterize, similar to prior likely benign. No hydroureter. Bladder unremarkable. Stomach/Bowel: Stomach is nondistended. Gastrostomy tube evident. Scattered mildly distended fluid-filled small bowel loops in the abdomen and pelvis measuring up to 3.4 cm diameter. Neither the terminal ileum nor the appendix are discretely visible. Gas and fluid is scattered along the length of the mildly distended colon with gas and fluid visible in the rectum. This appearance can be seen in the setting of diarrhea. Vascular/Lymphatic: There is mild atherosclerotic calcification of the abdominal aorta without aneurysm. There is no gastrohepatic or hepatoduodenal ligament lymphadenopathy. No retroperitoneal or mesenteric lymphadenopathy. No pelvic sidewall lymphadenopathy. Assessment for lymphadenopathy is limited by patient cachexia and diffuse edema soft tissues. Reproductive: The prostate gland and seminal vesicles are unremarkable. Other: Probable small volume intraperitoneal free fluid. Diffuse edema of mesentery and extraperitoneal tissues. Musculoskeletal: Bone windows reveal no worrisome lytic or sclerotic osseous lesions. Mild wedge compression deformity is seen at T4 and T7, similar to prior. Diffuse body wall edema evident. Review of the MIP images confirms the above findings. IMPRESSION: 1. No CT evidence for acute pulmonary embolus. 2. Marked  progression of extensive tree-in-bud nodularity in both lungs, right greater than left, with areas of consolidative airspace disease in the right upper lobe and right middle lobe. Imaging features are compatible with an infectious/inflammatory etiology. Aspiration not excluded. 3. Complete collapse of the right lower lobe with obliteration of the bronchus intermedius. 2.0 x 1.6 cm collection of gas and fluid in the medial right lower lobe  is progressive in the interval and may represent an area of necrosis with fistula formation or abscess. 4. Small right pleural effusion. 5. Scattered mildly distended fluid-filled small bowel loops in the abdomen and pelvis measuring up to 3.4 cm diameter. Gas and fluid is scattered along the length of the mildly distended colon with gas and fluid visible in the rectum. This appearance can be seen in the setting of diarrhea. Generally, imaging features suggest component of underlying dysmotility/ileus 6. Marked cachexia with diffuse edema of the mesentery, extraperitoneal fat, and body wall. 7. Patulous proximal and mid esophagus with debris in the lumen, compatible with the patient's known esophageal neoplasm. Electronically Signed   By: Misty Stanley M.D.   On: 05/28/2022 12:49   CT ABDOMEN PELVIS W CONTRAST  Result Date: 05/28/2022 CLINICAL DATA:  Shortness of breath.Abdominal pain. History of esophageal cancer. * Tracking Code: BO * EXAM: CT ANGIOGRAPHY CHEST CT ABDOMEN AND PELVIS WITH CONTRAST TECHNIQUE: Multidetector CT imaging of the chest was performed using the standard protocol during bolus administration of intravenous contrast. Multiplanar CT image reconstructions and MIPs were obtained to evaluate the vascular anatomy. Multidetector CT imaging of the abdomen and pelvis was performed using the standard protocol during bolus administration of intravenous contrast. RADIATION DOSE REDUCTION: This exam was performed according to the departmental dose-optimization program  which includes automated exposure control, adjustment of the mA and/or kV according to patient size and/or use of iterative reconstruction technique. CONTRAST:  149m OMNIPAQUE IOHEXOL 350 MG/ML SOLN COMPARISON:  01/31/2022 FINDINGS: CTA CHEST FINDINGS Cardiovascular: The heart size is normal. No substantial pericardial effusion. No thoracic aortic aneurysm. There is no filling defect in the opacified pulmonary arteries to suggest the presence of an acute pulmonary embolus. Left Port-A-Cath tip is positioned in the distal SVC. Mediastinum/Nodes: Diffusely increased attenuation of mediastinal fat may reflect edema. No mediastinal lymphadenopathy. There is no hilar lymphadenopathy. Proximal esophagus is patulous with food debris in the proximal and mid segment compatible with the known neoplasm and similar to prior. There is no axillary lymphadenopathy. Lungs/Pleura: Circumferential bronchial wall thickening is noted in both lungs, right greater than left. There is extensive tree-in-bud nodularity in the right lung and left lower lobe with areas of consolidative airspace disease in the right upper lobe measuring 2.7 x 1.3 cm. Patchy confluent airspace disease is superimposed on the nodularity in the right middle lobe. There is complete collapse of the right lower lobe with obliteration of the bronchus intermedius. 2.0 x 1.6 cm collection of gas and fluid in the medial right lower lobe may represent an area of necrosis with fistula formation or abscess. Small right pleural effusion evident. Musculoskeletal: Multiple healed right rib fractures noted. There also multiple healed fractures on the left. Review of the MIP images confirms the above findings. CT ABDOMEN and PELVIS FINDINGS Hepatobiliary: Tiny hypodensity in the medial segment left liver is stable in the interval, likely benign. Gallbladder not well visualized. No intrahepatic or extrahepatic biliary dilation. Pancreas: No focal mass lesion. No dilatation of  the main duct. No intraparenchymal cyst. No peripancreatic edema. Spleen: No splenomegaly. No focal mass lesion. Adrenals/Urinary Tract: No adrenal nodule or mass. Tiny hypodensities in both kidneys are too small to characterize, similar to prior likely benign. No hydroureter. Bladder unremarkable. Stomach/Bowel: Stomach is nondistended. Gastrostomy tube evident. Scattered mildly distended fluid-filled small bowel loops in the abdomen and pelvis measuring up to 3.4 cm diameter. Neither the terminal ileum nor the appendix are discretely visible. Gas and fluid is  scattered along the length of the mildly distended colon with gas and fluid visible in the rectum. This appearance can be seen in the setting of diarrhea. Vascular/Lymphatic: There is mild atherosclerotic calcification of the abdominal aorta without aneurysm. There is no gastrohepatic or hepatoduodenal ligament lymphadenopathy. No retroperitoneal or mesenteric lymphadenopathy. No pelvic sidewall lymphadenopathy. Assessment for lymphadenopathy is limited by patient cachexia and diffuse edema soft tissues. Reproductive: The prostate gland and seminal vesicles are unremarkable. Other: Probable small volume intraperitoneal free fluid. Diffuse edema of mesentery and extraperitoneal tissues. Musculoskeletal: Bone windows reveal no worrisome lytic or sclerotic osseous lesions. Mild wedge compression deformity is seen at T4 and T7, similar to prior. Diffuse body wall edema evident. Review of the MIP images confirms the above findings. IMPRESSION: 1. No CT evidence for acute pulmonary embolus. 2. Marked progression of extensive tree-in-bud nodularity in both lungs, right greater than left, with areas of consolidative airspace disease in the right upper lobe and right middle lobe. Imaging features are compatible with an infectious/inflammatory etiology. Aspiration not excluded. 3. Complete collapse of the right lower lobe with obliteration of the bronchus intermedius.  2.0 x 1.6 cm collection of gas and fluid in the medial right lower lobe is progressive in the interval and may represent an area of necrosis with fistula formation or abscess. 4. Small right pleural effusion. 5. Scattered mildly distended fluid-filled small bowel loops in the abdomen and pelvis measuring up to 3.4 cm diameter. Gas and fluid is scattered along the length of the mildly distended colon with gas and fluid visible in the rectum. This appearance can be seen in the setting of diarrhea. Generally, imaging features suggest component of underlying dysmotility/ileus 6. Marked cachexia with diffuse edema of the mesentery, extraperitoneal fat, and body wall. 7. Patulous proximal and mid esophagus with debris in the lumen, compatible with the patient's known esophageal neoplasm. Electronically Signed   By: Misty Stanley M.D.   On: 05/28/2022 12:49   DG Chest Portable 1 View  Result Date: 05/28/2022 CLINICAL DATA:  Shortness of breath. EXAM: PORTABLE CHEST 1 VIEW COMPARISON:  05/22/2022 FINDINGS: Lungs are hyperexpanded. Left lung clear. Increasing consolidative opacity at the right base with patchy airspace disease in the right mid and upper lung similar to prior. The cardio pericardial silhouette is enlarged. Left Port-A-Cath tip overlies the distal SVC. Telemetry leads overlie the chest. IMPRESSION: Increasing consolidative opacity at the right base with patchy airspace disease in the right mid and upper lung. Electronically Signed   By: Misty Stanley M.D.   On: 05/28/2022 09:05   DG Chest 1 View  Result Date: 05/22/2022 CLINICAL DATA:  Cough. EXAM: CHEST  1 VIEW COMPARISON:  March 02, 2022 FINDINGS: The heart size and mediastinal contours are stable. Right central venous line is unchanged. Patchy consolidation identified throughout the right lung. The left lung is clear. The visualized skeletal structures are stable. IMPRESSION: Pneumonia throughout the right lung. Electronically Signed   By: Abelardo Diesel M.D.   On: 05/22/2022 16:53    Orson Eva, DO  [facility]  If 7PM-7AM, please contact night-coverage [email or web address] Password [email or web address] 06/04/2022, 5:09 PM   LOS: 7 days

## 2022-06-04 NOTE — Plan of Care (Signed)
Patient AOX4, irritable and agitated with all staff, yelling at them from time to time.  VSS throughout shift.  All meds given via G-tube.  Tfs started at 11pm Jevity at 71m/hr.  Pt had nurse stop Tfs at 0530 yelling at her.  Morning labs drawn from port.  Pt voided in urinal.  POC maintained, will continue to monitor.  Problem: Education: Goal: Knowledge of General Education information will improve Description: Including pain rating scale, medication(s)/side effects and non-pharmacologic comfort measures Outcome: Progressing   Problem: Health Behavior/Discharge Planning: Goal: Ability to manage health-related needs will improve Outcome: Progressing   Problem: Clinical Measurements: Goal: Ability to maintain clinical measurements within normal limits will improve Outcome: Progressing Goal: Will remain free from infection Outcome: Progressing Goal: Diagnostic test results will improve Outcome: Progressing Goal: Respiratory complications will improve Outcome: Progressing Goal: Cardiovascular complication will be avoided Outcome: Progressing   Problem: Activity: Goal: Risk for activity intolerance will decrease Outcome: Progressing   Problem: Nutrition: Goal: Adequate nutrition will be maintained Outcome: Progressing   Problem: Coping: Goal: Level of anxiety will decrease Outcome: Progressing   Problem: Elimination: Goal: Will not experience complications related to bowel motility Outcome: Progressing Goal: Will not experience complications related to urinary retention Outcome: Progressing   Problem: Pain Managment: Goal: General experience of comfort will improve Outcome: Progressing   Problem: Safety: Goal: Ability to remain free from injury will improve Outcome: Progressing   Problem: Skin Integrity: Goal: Risk for impaired skin integrity will decrease Outcome: Progressing

## 2022-06-04 NOTE — Progress Notes (Signed)
Pt has refused his tube feeding since 5:30am per report, pt continuous to refused. All his med were given via PEG tube, but refused tube feeding. MD was notifed. Pt has port accessed but refused CHG bath.

## 2022-06-05 DIAGNOSIS — J690 Pneumonitis due to inhalation of food and vomit: Secondary | ICD-10-CM | POA: Diagnosis not present

## 2022-06-05 DIAGNOSIS — J9611 Chronic respiratory failure with hypoxia: Secondary | ICD-10-CM

## 2022-06-05 DIAGNOSIS — Z7189 Other specified counseling: Secondary | ICD-10-CM

## 2022-06-05 LAB — GLUCOSE, CAPILLARY
Glucose-Capillary: 65 mg/dL — ABNORMAL LOW (ref 70–99)
Glucose-Capillary: 85 mg/dL (ref 70–99)
Glucose-Capillary: 100 mg/dL — ABNORMAL HIGH (ref 70–99)
Glucose-Capillary: 96 mg/dL (ref 70–99)

## 2022-06-05 LAB — BASIC METABOLIC PANEL
Anion gap: 8 (ref 5–15)
BUN: 13 mg/dL (ref 6–20)
CO2: 21 mmol/L — ABNORMAL LOW (ref 22–32)
Calcium: 7.6 mg/dL — ABNORMAL LOW (ref 8.9–10.3)
Chloride: 108 mmol/L (ref 98–111)
Creatinine, Ser: 0.7 mg/dL (ref 0.61–1.24)
GFR, Estimated: >60 mL/min (ref >60)
Glucose, Bld: 94 mg/dL (ref 70–99)
Potassium: 3.1 mmol/L — ABNORMAL LOW (ref 3.5–5.1)
Sodium: 137 mmol/L (ref 135–145)

## 2022-06-05 LAB — PHOSPHORUS: Phosphorus: 2.9 mg/dL (ref 2.5–4.6)

## 2022-06-05 LAB — MAGNESIUM: Magnesium: 1.7 mg/dL (ref 1.7–2.4)

## 2022-06-05 MED ORDER — POTASSIUM CHLORIDE IN NACL 20-0.9 MEQ/L-% IV SOLN: INTRAVENOUS | Status: DC

## 2022-06-05 MED ORDER — POTASSIUM CHLORIDE 20 MEQ PO PACK
40.0000 meq | PACK | Freq: Once | ORAL | Status: AC
  Administered 2022-06-05: 40 meq
  Filled 2022-06-05: qty 2

## 2022-06-05 MED ORDER — AMOXICILLIN-POT CLAVULANATE 875-125 MG PO TABS: 1.0000 | ORAL_TABLET | Freq: Two times a day (BID) | ORAL | 0 refills | Status: AC

## 2022-06-05 MED ORDER — HEPARIN SOD (PORK) LOCK FLUSH 100 UNIT/ML IV SOLN
INTRAVENOUS | Status: AC
  Administered 2022-06-05: 500 [IU]
  Filled 2022-06-05: qty 5

## 2022-06-05 MED ORDER — HEPARIN SOD (PORK) LOCK FLUSH 100 UNIT/ML IV SOLN: 500.0000 [IU] | INTRAVENOUS | Status: AC | PRN

## 2022-06-05 NOTE — Progress Notes (Signed)
Palliative: Chart review completed.  Face-to-face discussion with transition of care team.    Mr. Trevor [name] goals are set for full scope/full code.  He continues to have limited insight into his chronic illness burden.  He is expected to return to his long-term care facility, [facility]  Mr. Trevor [name] is frequently argumentative with staff, especially related to his nutrition.  Plan: Continue full scope/full code.  Continue cancer treatments as offered.  Return to long-term care at [facility].  No charge Quinn Axe, NP Palliative medicine team Team phone [phone] Greater than 50% of this time was spent counseling and coordinating care related to the above assessment and plan.

## 2022-06-05 NOTE — Discharge Summary (Addendum)
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  He is a resident of [facility] long-term care.  He was seen in the emergency department 1 week PTA and diagnosed with a right-sided pneumonia but refused admission at that time.  He was discharged [place] on Augmentin which reportedly was started a couple of days ago at [facility].    On the day of admission, he complained of shortness of breath and EMS was called found his SpO2 to be 76% on 3 L.  He was placed on a nonrebreather with improvement.  His ED workup reveals a worsening right-sided pneumonia with concern for abscess lung.  The CT also reveals some right medial lobe gas and fluid concerning for possible necrosis.  Patient was stabilized in the ED and admission was requested for further management.  Patient continues to insist that he be full code and wants to continue cancer treatment.  He is due for radiation treatment on 05/30/2022 and wants to be discharged prior to that time. He received 6 days of IV Unasyn.  He remained stable clinically, afebrile, hemodynamically stable.  He was weaned off oxygen.  During the hospitalization, pt continued his usual behavioral habits on intermittently refusing care, medications, and enteral feedings.  Assessment and  Plan: Aspiration Pneumonia/Necrotizing Pneumonia -2/4 CTA chest--no PE; 2.0 x 1.6 cm gas fluid collection medial RLL -continue unasyn -appreciate pulm consult -pt reports that he has been aspirating his secretions  -he is also likely aspirating his oral consumption as well -continue ampicillin/sulbactam -aspiration precautions -PT INSISTS ON ORAL EATING DESPITE RISKS -SLP evaluation--pt refuses to follow any recommendations -Pt refusing care unless we give him a regular diet -he continues to frequently refuse care, meds, TF -He is high risk for more aspiration with regular diet -Pt insisting on regular diet only and verbalized understanding after being counseled on risks of recurrent aspiration, intubation, death. -pt has capacity to make decisions throughout the hospitalization -received 7 days IV unasyn>>d/c to SNF with amox/clav x 21 more days -I have had multiple discussions with the patient and give him all the risk/benefits/ and alternatives--he often become belligerent stating "it's my body.  I have a right do what I want to do"   Acute on chronic respiratory failure with hypoxia -due to necrotizing pneumonia and COVID-19 -initially presented with tachypnea with oxygen saturation 76% on 3L -now stable on RA -pt states he is on 2L oxygen prn -repeat COVID 19-- neg on 2/8   Chronic Hypotension -pt has had soft BPs for most of his hospitalizations -continue midodrine with stable BPs   Hypokalemia Secondary to poor oral intake. Patient states that the Osmolite which was prescribed last month gives him "terrible diarrhea" so he has not been compliant with it. Dietician consult placed and patient will be started on  - Magnesium is normal - replace potassium regularly (he is supposed  to be taking daily liquid potassium) - repleted  -continue daily supplementation after d/c  Chronic respiratory failure with hypoxia -chronically on 2 L -stable   Hypophosphatemia -repleted    Stage IV B Metastatic Squamous Cell Cancer - his chemotherapy was placed on temporary hold due to severe weight loss -now on XRT at [facility] - he is followed by Dr. Delton Coombes - palliative care met with him during last admission and he remained in denial about the severity of illness and wanted to be full code/full scope care  - follow up with Dr. Delton Coombes for ongoing discussion   Frequent Falls - likely exacerbated by severe debility, malnutrition, weight loss - fall precautions recommended - PT ordered recommending SNF    Failure to thrive  - Pt seems unable to care for self at home - he continues to refuse continuous enteral feeding    Severe protein calorie malnutrition - resume regular diet plus PEG tube feeds  this evening - monitor for refeeding syndrome, recheck Mg, Phos in AM, replete as needed    Tobacco  - nicotine patch ordered - counseled patient on tobacco cessation    Chronic nausea and vomiting - secondary to esophageal malignancy - treat supportively - pt insists upon po intake, will not follow medical advice   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  - gabapentin    Sepsis due to pneumonia -IV fluid resuscitation ordered -present on admission -aggressive IV antibiotics given -supportive measures -blood cultures negative to date   Diarrhea -cdiff neg -start loperamide           Family Communication: no  Family at bedside   Consultants:  pulm, palliative   Code Status:  FULL    DVT Prophylaxis: Xarelto     Procedures: As Listed in Progress Note Above   Antibiotics: Unasyn 2/4>>2/12 Amoxclav 2/12>>         Consultants: pulm Procedures performed: none  Disposition: Skilled nursing facility Diet recommendation:  Enteral feeding Isosource at 50 cc/ hr DISCHARGE MEDICATION: Allergies as of 06/05/2022       Reactions   Pork-derived Products Other (See Comments)   Patient does not eat pork due to his religious beliefs         Medication List     STOP taking these medications    metoprolol tartrate 25 MG tablet Commonly known as: LOPRESSOR   oxyCODONE 5 MG immediate release tablet Commonly known as: Oxy IR/ROXICODONE   pantoprazole 40 MG tablet Commonly known as: PROTONIX   prochlorperazine 10 MG tablet Commonly known as: COMPAZINE       TAKE these medications    albuterol 108 (90 Base) MCG/ACT inhaler Commonly known as: VENTOLIN HFA Inhale 2 puffs into the lungs every 4 (four) hours as needed for wheezing or shortness of breath.   allopurinol 300 MG tablet Commonly known as: ZYLOPRIM Take 1 tablet (300 mg total) by mouth daily. What changed: how to take this   amoxicillin-clavulanate 875-125 MG tablet Commonly known as: AUGMENTIN Take 1 tablet by mouth every 12 (twelve) hours. X 21 more days What changed: additional instructions   ferrous sulfate 325 (65 FE) MG EC tablet Take 1 tablet (325 mg total) by mouth daily with breakfast. What changed:  how to take this when to take this additional instructions   folic acid 1 MG tablet Commonly known as: FOLVITE Place 1 tablet (1 mg total) into feeding tube daily.   free water Soln Place 200 mLs into feeding tube every  6 (six) hours. What changed:  how much to take when to take this additional instructions   gabapentin 250 MG/5ML solution Commonly known as: NEURONTIN Place 6 mLs (300 mg total) into feeding tube at bedtime. What changed: when to take this   Isosource 1.5 Cal Liqd 1,000 mLs by Feeding Tube route See admin instructions. Isosource 1.5. Rate: 55 mLs/hour for 24 hours/day. Administer 1000 mLs daily. What changed: Another medication with the same name was removed. Continue taking this medication, and follow the directions you see here.   loperamide 2 MG capsule Commonly known as: IMODIUM 4 mg See admin instructions. 4 mg via g-tube as needed for diarrhea after each loose stool. Maximum of 8 capsules per day.    magnesium oxide 400 (240 Mg) MG tablet Commonly known as: MAG-OX Place 400 mg into feeding tube 2 (two) times daily.   megestrol 400 MG/10ML suspension Commonly known as: MEGACE Take 10 mLs (400 mg total) by mouth 2 (two) times daily. What changed: how to take this   midodrine 5 MG tablet Commonly known as: PROAMATINE Place 1 tablet (5 mg total) into feeding tube 3 (three) times daily with meals. What changed: when to take this   multivitamin with minerals tablet Take 1 tablet by mouth daily. What changed: how to take this   nicotine 14 mg/24hr patch Commonly known as: NICODERM CQ - dosed in mg/24 hours Place 1 patch (14 mg total) onto the skin daily as needed (nicotine craving).   omeprazole 2 mg/mL Susp oral suspension Commonly known as: KONVOMEP Place 20 mg into feeding tube daily.   ondansetron 8 MG tablet Commonly known as: ZOFRAN Place 8 mg into feeding tube every 6 (six) hours as needed for nausea or vomiting.   potassium chloride SA 20 MEQ tablet Commonly known as: Klor-Con M20 Take 1 tablet (20 mEq total) by mouth daily. What changed:  how to take this additional instructions        Contact information for after-discharge care     Destination     [facility] .   Service: Skilled Nursing Contact information: [address] [place] [phone]                    Discharge Exam: Danley Danker Weights   06/02/22 0700 06/03/22 0500 06/05/22 0622  Weight: 45 kg 45 kg 42 kg   HEENT:  [place]/AT, No thrush, no icterus CV:  RRR, no rub, no S3, no S4 Lung:  bibasilar rales. No wheeze Abd:  soft/+BS, NT Ext:  No edema, no lymphangitis, no synovitis, no rash   Condition at discharge: stable  The results of significant diagnostics from this hospitalization (including imaging, microbiology, ancillary and laboratory) are listed below for reference.   Imaging Studies: CT  Angio Chest PE W and/or Wo Contrast  Result Date: 05/28/2022 CLINICAL DATA:  Shortness of breath.Abdominal pain. History of esophageal cancer. * Tracking Code: BO * EXAM: CT ANGIOGRAPHY CHEST CT ABDOMEN AND PELVIS WITH CONTRAST TECHNIQUE: Multidetector CT imaging of the chest was performed using the standard protocol during bolus administration of intravenous contrast. Multiplanar CT image reconstructions and MIPs were obtained to evaluate the vascular anatomy. Multidetector CT imaging of the abdomen and pelvis was performed using the standard protocol during bolus administration of intravenous contrast. RADIATION DOSE REDUCTION: This exam was performed according to the departmental dose-optimization program which includes automated exposure control, adjustment of the mA and/or kV according  to patient size and/or use of iterative reconstruction technique. CONTRAST:  140m OMNIPAQUE IOHEXOL 350 MG/ML SOLN COMPARISON:  01/31/2022 FINDINGS: CTA CHEST FINDINGS Cardiovascular: The heart size is normal. No substantial pericardial effusion. No thoracic aortic aneurysm. There is no filling defect in the opacified pulmonary arteries to suggest the presence of an acute pulmonary embolus. Left Port-A-Cath tip is positioned in the distal SVC. Mediastinum/Nodes: Diffusely increased attenuation of mediastinal fat may reflect edema. No mediastinal lymphadenopathy. There is no hilar lymphadenopathy. Proximal esophagus is patulous with food debris in the proximal and mid segment compatible with the known neoplasm and similar to prior. There is no axillary lymphadenopathy. Lungs/Pleura: Circumferential bronchial wall thickening is noted in both lungs, right greater than left. There is extensive tree-in-bud nodularity in the right lung and left lower lobe with areas of consolidative airspace disease in the right upper lobe measuring 2.7 x 1.3 cm. Patchy confluent airspace disease is superimposed on the nodularity in the right middle  lobe. There is complete collapse of the right lower lobe with obliteration of the bronchus intermedius. 2.0 x 1.6 cm collection of gas and fluid in the medial right lower lobe may represent an area of necrosis with fistula formation or abscess. Small right pleural effusion evident. Musculoskeletal: Multiple healed right rib fractures noted. There also multiple healed fractures on the left. Review of the MIP images confirms the above findings. CT ABDOMEN and PELVIS FINDINGS Hepatobiliary: Tiny hypodensity in the medial segment left liver is stable in the interval, likely benign. Gallbladder not well visualized. No intrahepatic or extrahepatic biliary dilation. Pancreas: No focal mass lesion. No dilatation of the main duct. No intraparenchymal cyst. No peripancreatic edema. Spleen: No splenomegaly. No focal mass lesion. Adrenals/Urinary Tract: No adrenal nodule or mass. Tiny hypodensities in both kidneys are too small to characterize, similar to prior likely benign. No hydroureter. Bladder unremarkable. Stomach/Bowel: Stomach is nondistended. Gastrostomy tube evident. Scattered mildly distended fluid-filled small bowel loops in the abdomen and pelvis measuring up to 3.4 cm diameter. Neither the terminal ileum nor the appendix are discretely visible. Gas and fluid is scattered along the length of the mildly distended colon with gas and fluid visible in the rectum. This appearance can be seen in the setting of diarrhea. Vascular/Lymphatic: There is mild atherosclerotic calcification of the abdominal aorta without aneurysm. There is no gastrohepatic or hepatoduodenal ligament lymphadenopathy. No retroperitoneal or mesenteric lymphadenopathy. No pelvic sidewall lymphadenopathy. Assessment for lymphadenopathy is limited by patient cachexia and diffuse edema soft tissues. Reproductive: The prostate gland and seminal vesicles are unremarkable. Other: Probable small volume intraperitoneal free fluid. Diffuse edema of  mesentery and extraperitoneal tissues. Musculoskeletal: Bone windows reveal no worrisome lytic or sclerotic osseous lesions. Mild wedge compression deformity is seen at T4 and T7, similar to prior. Diffuse body wall edema evident. Review of the MIP images confirms the above findings. IMPRESSION: 1. No CT evidence for acute pulmonary embolus. 2. Marked progression of extensive tree-in-bud nodularity in both lungs, right greater than left, with areas of consolidative airspace disease in the right upper lobe and right middle lobe. Imaging features are compatible with an infectious/inflammatory etiology. Aspiration not excluded. 3. Complete collapse of the right lower lobe with obliteration of the bronchus intermedius. 2.0 x 1.6 cm collection of gas and fluid in the medial right lower lobe is progressive in the interval and may represent an area of necrosis with fistula formation or abscess. 4. Small right pleural effusion. 5. Scattered mildly distended fluid-filled small bowel loops in the  abdomen and pelvis measuring up to 3.4 cm diameter. Gas and fluid is scattered along the length of the mildly distended colon with gas and fluid visible in the rectum. This appearance can be seen in the setting of diarrhea. Generally, imaging features suggest component of underlying dysmotility/ileus 6. Marked cachexia with diffuse edema of the mesentery, extraperitoneal fat, and body wall. 7. Patulous proximal and mid esophagus with debris in the lumen, compatible with the patient's known esophageal neoplasm. Electronically Signed   By: Misty Stanley M.D.   On: 05/28/2022 12:49   CT ABDOMEN PELVIS W CONTRAST  Result Date: 05/28/2022 CLINICAL DATA:  Shortness of breath.Abdominal pain. History of esophageal cancer. * Tracking Code: BO * EXAM: CT ANGIOGRAPHY CHEST CT ABDOMEN AND PELVIS WITH CONTRAST TECHNIQUE: Multidetector CT imaging of the chest was performed using the standard protocol during bolus administration of intravenous  contrast. Multiplanar CT image reconstructions and MIPs were obtained to evaluate the vascular anatomy. Multidetector CT imaging of the abdomen and pelvis was performed using the standard protocol during bolus administration of intravenous contrast. RADIATION DOSE REDUCTION: This exam was performed according to the departmental dose-optimization program which includes automated exposure control, adjustment of the mA and/or kV according to patient size and/or use of iterative reconstruction technique. CONTRAST:  14m OMNIPAQUE IOHEXOL 350 MG/ML SOLN COMPARISON:  01/31/2022 FINDINGS: CTA CHEST FINDINGS Cardiovascular: The heart size is normal. No substantial pericardial effusion. No thoracic aortic aneurysm. There is no filling defect in the opacified pulmonary arteries to suggest the presence of an acute pulmonary embolus. Left Port-A-Cath tip is positioned in the distal SVC. Mediastinum/Nodes: Diffusely increased attenuation of mediastinal fat may reflect edema. No mediastinal lymphadenopathy. There is no hilar lymphadenopathy. Proximal esophagus is patulous with food debris in the proximal and mid segment compatible with the known neoplasm and similar to prior. There is no axillary lymphadenopathy. Lungs/Pleura: Circumferential bronchial wall thickening is noted in both lungs, right greater than left. There is extensive tree-in-bud nodularity in the right lung and left lower lobe with areas of consolidative airspace disease in the right upper lobe measuring 2.7 x 1.3 cm. Patchy confluent airspace disease is superimposed on the nodularity in the right middle lobe. There is complete collapse of the right lower lobe with obliteration of the bronchus intermedius. 2.0 x 1.6 cm collection of gas and fluid in the medial right lower lobe may represent an area of necrosis with fistula formation or abscess. Small right pleural effusion evident. Musculoskeletal: Multiple healed right rib fractures noted. There also multiple  healed fractures on the left. Review of the MIP images confirms the above findings. CT ABDOMEN and PELVIS FINDINGS Hepatobiliary: Tiny hypodensity in the medial segment left liver is stable in the interval, likely benign. Gallbladder not well visualized. No intrahepatic or extrahepatic biliary dilation. Pancreas: No focal mass lesion. No dilatation of the main duct. No intraparenchymal cyst. No peripancreatic edema. Spleen: No splenomegaly. No focal mass lesion. Adrenals/Urinary Tract: No adrenal nodule or mass. Tiny hypodensities in both kidneys are too small to characterize, similar to prior likely benign. No hydroureter. Bladder unremarkable. Stomach/Bowel: Stomach is nondistended. Gastrostomy tube evident. Scattered mildly distended fluid-filled small bowel loops in the abdomen and pelvis measuring up to 3.4 cm diameter. Neither the terminal ileum nor the appendix are discretely visible. Gas and fluid is scattered along the length of the mildly distended colon with gas and fluid visible in the rectum. This appearance can be seen in the setting of diarrhea. Vascular/Lymphatic: There is mild atherosclerotic  calcification of the abdominal aorta without aneurysm. There is no gastrohepatic or hepatoduodenal ligament lymphadenopathy. No retroperitoneal or mesenteric lymphadenopathy. No pelvic sidewall lymphadenopathy. Assessment for lymphadenopathy is limited by patient cachexia and diffuse edema soft tissues. Reproductive: The prostate gland and seminal vesicles are unremarkable. Other: Probable small volume intraperitoneal free fluid. Diffuse edema of mesentery and extraperitoneal tissues. Musculoskeletal: Bone windows reveal no worrisome lytic or sclerotic osseous lesions. Mild wedge compression deformity is seen at T4 and T7, similar to prior. Diffuse body wall edema evident. Review of the MIP images confirms the above findings. IMPRESSION: 1. No CT evidence for acute pulmonary embolus. 2. Marked progression of  extensive tree-in-bud nodularity in both lungs, right greater than left, with areas of consolidative airspace disease in the right upper lobe and right middle lobe. Imaging features are compatible with an infectious/inflammatory etiology. Aspiration not excluded. 3. Complete collapse of the right lower lobe with obliteration of the bronchus intermedius. 2.0 x 1.6 cm collection of gas and fluid in the medial right lower lobe is progressive in the interval and may represent an area of necrosis with fistula formation or abscess. 4. Small right pleural effusion. 5. Scattered mildly distended fluid-filled small bowel loops in the abdomen and pelvis measuring up to 3.4 cm diameter. Gas and fluid is scattered along the length of the mildly distended colon with gas and fluid visible in the rectum. This appearance can be seen in the setting of diarrhea. Generally, imaging features suggest component of underlying dysmotility/ileus 6. Marked cachexia with diffuse edema of the mesentery, extraperitoneal fat, and body wall. 7. Patulous proximal and mid esophagus with debris in the lumen, compatible with the patient's known esophageal neoplasm. Electronically Signed   By: Misty Stanley M.D.   On: 05/28/2022 12:49   DG Chest Portable 1 View  Result Date: 05/28/2022 CLINICAL DATA:  Shortness of breath. EXAM: PORTABLE CHEST 1 VIEW COMPARISON:  05/22/2022 FINDINGS: Lungs are hyperexpanded. Left lung clear. Increasing consolidative opacity at the right base with patchy airspace disease in the right mid and upper lung similar to prior. The cardio pericardial silhouette is enlarged. Left Port-A-Cath tip overlies the distal SVC. Telemetry leads overlie the chest. IMPRESSION: Increasing consolidative opacity at the right base with patchy airspace disease in the right mid and upper lung. Electronically Signed   By: Misty Stanley M.D.   On: 05/28/2022 09:05   DG Chest 1 View  Result Date: 05/22/2022 CLINICAL DATA:  Cough. EXAM: CHEST   1 VIEW COMPARISON:  March 02, 2022 FINDINGS: The heart size and mediastinal contours are stable. Right central venous line is unchanged. Patchy consolidation identified throughout the right lung. The left lung is clear. The visualized skeletal structures are stable. IMPRESSION: Pneumonia throughout the right lung. Electronically Signed   By: Abelardo Diesel M.D.   On: 05/22/2022 16:53    Microbiology: Results for orders placed or performed during the hospital encounter of 05/28/22  Blood culture (routine x 2)     Status: None   Collection Time: 05/28/22 10:36 AM   Specimen: BLOOD  Result Value Ref Range Status   Specimen Description BLOOD BLOOD LEFT ARM  Final   Special Requests NONE  Final   Culture   Final    NO GROWTH 6 DAYS Performed at [facility], [address], [place], [place] [address]    Report Status 06/03/2022 FINAL  Final  Blood culture (routine x 2)     Status: None   Collection Time: 05/28/22  3:20 PM  Specimen: BLOOD RIGHT ARM  Result Value Ref Range Status   Specimen Description   Final    BLOOD RIGHT ARM BOTTLES DRAWN AEROBIC AND ANAEROBIC   Special Requests Blood Culture adequate volume  Final   Culture   Final    NO GROWTH 6 DAYS Performed at [facility], [address] [place], [place] [address]    Report Status 06/03/2022 FINAL  Final  Resp panel by RT-PCR (RSV, Flu A&B, Covid) Anterior Nasal Swab     Status: None   Collection Time: 06/01/22 11:21 AM   Specimen: Anterior Nasal Swab  Result Value Ref Range Status   SARS Coronavirus 2 by RT PCR NEGATIVE NEGATIVE Final    Comment: (NOTE) SARS-CoV-2 target nucleic acids are NOT DETECTED.  The SARS-CoV-2 RNA is generally detectable in upper respiratory specimens during the acute phase of infection. The lowest concentration of SARS-CoV-2 viral copies this assay can detect is 138 copies/mL. A negative result does not preclude SARS-Cov-2 infection and should not be used as the sole basis for treatment  or other patient management decisions. A negative result may occur with  improper specimen collection/handling, submission of specimen other than nasopharyngeal swab, presence of viral mutation(s) within the areas targeted by this assay, and inadequate number of viral copies(<138 copies/mL). A negative result must be combined with clinical observations, patient history, and epidemiological information. The expected result is Negative.  Fact Sheet for Patients:  EntrepreneurPulse.com.au  Fact Sheet for Healthcare Providers:  IncredibleEmployment.be  This test is no t yet approved or cleared by the [place] FDA and  has been authorized for detection and/or diagnosis of SARS-CoV-2 by FDA under an Emergency Use Authorization (EUA). This EUA will remain  in effect (meaning this test can be used) for the duration of the COVID-19 declaration under Section 564(b)(1) of the Act, 21 U.S.C.section 360bbb-3(b)(1), unless the authorization is terminated  or revoked sooner.       Influenza A by PCR NEGATIVE NEGATIVE Final   Influenza B by PCR NEGATIVE NEGATIVE Final    Comment: (NOTE) The Xpert Xpress SARS-CoV-2/FLU/RSV plus assay is intended as an aid in the diagnosis of influenza from Nasopharyngeal swab specimens and should not be used as a sole basis for treatment. Nasal washings and aspirates are unacceptable for Xpert Xpress SARS-CoV-2/FLU/RSV testing.  Fact Sheet for Patients: EntrepreneurPulse.com.au  Fact Sheet for Healthcare Providers: IncredibleEmployment.be  This test is not yet approved or cleared by the [place] FDA and has been authorized for detection and/or diagnosis of SARS-CoV-2 by FDA under an Emergency Use Authorization (EUA). This EUA will remain in effect (meaning this test can be used) for the duration of the COVID-19 declaration under Section 564(b)(1) of the Act, 21 U.S.C. section  360bbb-3(b)(1), unless the authorization is terminated or revoked.     Resp Syncytial Virus by PCR NEGATIVE NEGATIVE Final    Comment: (NOTE) Fact Sheet for Patients: EntrepreneurPulse.com.au  Fact Sheet for Healthcare Providers: IncredibleEmployment.be  This test is not yet approved or cleared by the [place] FDA and has been authorized for detection and/or diagnosis of SARS-CoV-2 by FDA under an Emergency Use Authorization (EUA). This EUA will remain in effect (meaning this test can be used) for the duration of the COVID-19 declaration under Section 564(b)(1) of the Act, 21 U.S.C. section 360bbb-3(b)(1), unless the authorization is terminated or revoked.  Performed at [facility], [address], [place],  [address]   C Difficile Quick Screen (NO PCR Reflex)  Status: None   Collection Time: 06/03/22  2:47 PM   Specimen: STOOL  Result Value Ref Range Status   C Diff antigen NEGATIVE NEGATIVE Final   C Diff toxin NEGATIVE NEGATIVE Final   C Diff interpretation No C. difficile detected.  Final    Comment: Performed at [facility], [address], [place], [place] [address]    Labs: CBC: Recent Labs  Lab 05/30/22 0459 05/31/22 0510 06/01/22 0510  WBC 6.8 7.8 9.4  HGB 9.3* 9.3* 9.5*  HCT 29.1* 28.7* 28.8*  MCV 77.6* 77.8* 77.0*  PLT 308 252 [id number]   Basic Metabolic Panel: Recent Labs  Lab 05/31/22 0510 06/01/22 0510 06/02/22 0425 06/04/22 0520 06/05/22 1050  NA 137 134* 139 135 137  K 3.3* 3.2* 4.6 3.1* 3.1*  CL 101 101 109 108 108  CO2 26 23 24 $ 21* 21*  GLUCOSE 63* 112* 87 101* 94  BUN 16 14 16 14 13  $ CREATININE 0.80 0.72 0.76 0.78 0.70  CALCIUM 8.6* 8.2* 8.2* 7.8* 7.6*  MG 2.2 1.9 1.9 1.7 1.7  PHOS 2.8 1.9* 2.2* 2.6 2.9   Liver Function Tests: No results for input(s): "AST", "ALT", "ALKPHOS", "BILITOT", "PROT", "ALBUMIN" in the last 168 hours. CBG: Recent Labs  Lab 06/04/22 2049 06/04/22 2356  06/05/22 0628 06/05/22 0808 06/05/22 1238  GLUCAP 111* 85 65* 85 100*    Discharge time spent: greater than 30 minutes.  Signed: Orson Eva, MD [facility] 06/05/2022

## 2022-06-05 NOTE — TOC Transition Note (Signed)
Transition of Care [facility]) - CM/SW Discharge Note   Patient Details  Name: Trevor [name] MRN: [id number] Date of Birth: 1971/03/24  [facility]) CM/SW Contact:  Boneta Lucks, RN Phone Number: 06/05/2022, 11:39 AM   Clinical Narrative:   [name] has a male bed today. They are now saying management wants insurance authorization from his managed medicaid. They started the [place], team updated.    Final next level of care: Long Term Acute Care (LTAC) Barriers to Discharge: Insurance Authorization   Patient Goals and CMS Choice     Discharge Placement        Patient and family notified of of transfer: 06/05/22  Discharge Plan and Services Additional resources added to the After Visit Summary for   In-house Referral: Clinical Social Work            Social Determinants of Health ([place]) Interventions SDOH Screenings   Food Insecurity: No Food Insecurity (05/28/2022)  Housing: Low Risk  (05/28/2022)  Transportation Needs: No Transportation Needs (05/28/2022)  Utilities: Not At Risk (05/28/2022)  Depression (PHQ2-9): Low Risk  (02/07/2022)  Financial Resource Strain: High Risk (09/28/2021)  Stress: No Stress Concern Present (02/07/2022)  Tobacco Use: High Risk (05/29/2022)     Readmission Risk Interventions    05/29/2022   12:43 PM 03/03/2022   11:46 AM 01/24/2022    3:13 PM  Readmission Risk Prevention Plan  Transportation Screening Complete Complete Complete  PCP or Specialist Appt within 3-5 Days   Complete  HRI or [place]   Complete  Social Work Consult for [place] Planning/Counseling   Complete  Palliative Care Screening   Complete  Medication Review Press photographer) Complete Complete Complete  HRI or Home Care Consult Complete Complete   SW Recovery Care/Counseling Consult Complete Complete   Palliative Care Screening Complete Complete   Skilled Nursing Facility Complete Patient Refused

## 2022-06-05 NOTE — TOC Transition Note (Signed)
Transition of Care [facility]) - CM/SW Discharge Note   Patient Details  Name: Trevor [name] MRN: [id number] Date of Birth: Nov 22, 1970  Transition of Care [facility]) CM/SW Contact:  Boneta Lucks, RN Phone Number: 06/05/2022, 4:02 PM   Clinical Narrative:   Insurance auth received. MD updated DC summary. RN will call report. TOC will call EMS with RN is ready, Medical necessity printed.    Final next level of care: Long Term Acute Care (LTAC) Barriers to Discharge: Insurance Authorization      Patient and family notified of of transfer: 06/05/22  Discharge Plan and Services Additional resources added to the After Visit Summary for   In-house Referral: Clinical Social Work             Social Determinants of Health ([place]) Interventions SDOH Screenings   Food Insecurity: No Food Insecurity (05/28/2022)  Housing: Low Risk  (05/28/2022)  Transportation Needs: No Transportation Needs (05/28/2022)  Utilities: Not At Risk (05/28/2022)  Depression (PHQ2-9): Low Risk  (02/07/2022)  Financial Resource Strain: High Risk (09/28/2021)  Stress: No Stress Concern Present (02/07/2022)  Tobacco Use: High Risk (05/29/2022)     Readmission Risk Interventions    05/29/2022   12:43 PM 03/03/2022   11:46 AM 01/24/2022    3:13 PM  Readmission Risk Prevention Plan  Transportation Screening Complete Complete Complete  PCP or Specialist Appt within 3-5 Days   Complete  HRI or [employer]   Complete  Social Work Consult for [place] Planning/Counseling   Complete  Palliative Care Screening   Complete  Medication Review Press photographer) Complete Complete Complete  HRI or Home Care Consult Complete Complete   SW Recovery Care/Counseling Consult Complete Complete   Palliative Care Screening Complete Complete   Skilled Nursing Facility Complete Patient Refused

## 2022-06-07 ENCOUNTER — Telehealth: Payer: Self-pay | Admitting: *Deleted

## 2022-06-07 NOTE — Transitions of Care (Post Inpatient/ED Visit) (Signed)
   06/07/2022  Name: Trevor [name] MRN: [id number] DOB: [birth year]  Today's TOC FU Call Status: Today's TOC FU Call Status:: Unsuccessul Call (1st Attempt) Unsuccessful Call (1st Attempt) Date: 06/07/22  Attempted to reach the patient regarding the most recent Inpatient/ED visit.  Follow Up Plan: Additional outreach attempts will be made to reach the patient to complete the Transitions of Care (Post Inpatient/ED visit) call.   Lurena Joiner RN, BSN [place]  [employer] manager

## 2022-06-09 ENCOUNTER — Ambulatory Visit: Payer: Self-pay | Admitting: *Deleted

## 2022-06-09 NOTE — Patient Outreach (Signed)
  Care Coordination   06/09/2022 Name: Trevor [name] MRN: [id number] DOB: 03-09-1971   Care Coordination Outreach Attempts:  An unsuccessful telephone outreach was attempted today to offer the patient information about available care coordination services as a benefit of their health plan.   Follow Up Plan:  Additional outreach attempts will be made to offer the patient care coordination information and services.   Encounter Outcome:  No Answer   Care Coordination Interventions:  No, not indicated    Cing [place] L. Lavina Hamman, RN, BSN, [place] Coordinator Office number [phone]

## 2022-06-11 ENCOUNTER — Other Ambulatory Visit: Payer: Self-pay

## 2022-06-13 DIAGNOSIS — C159 Malignant neoplasm of esophagus, unspecified: Secondary | ICD-10-CM | POA: Diagnosis not present

## 2022-06-13 DIAGNOSIS — E876 Hypokalemia: Secondary | ICD-10-CM | POA: Diagnosis not present

## 2022-06-18 ENCOUNTER — Emergency Department (HOSPITAL_COMMUNITY): Payer: 59

## 2022-06-18 ENCOUNTER — Encounter (HOSPITAL_COMMUNITY): Payer: Self-pay | Admitting: Hematology

## 2022-06-18 ENCOUNTER — Encounter (HOSPITAL_COMMUNITY): Payer: Self-pay

## 2022-06-18 ENCOUNTER — Inpatient Hospital Stay (HOSPITAL_COMMUNITY): Payer: 59

## 2022-06-18 ENCOUNTER — Encounter: Payer: Self-pay | Admitting: Hematology

## 2022-06-18 ENCOUNTER — Inpatient Hospital Stay (HOSPITAL_COMMUNITY)
Admission: EM | Admit: 2022-06-18 | Discharge: 2022-06-23 | DRG: 871 | Disposition: E | Payer: 59 | Source: Skilled Nursing Facility | Attending: Internal Medicine | Admitting: Internal Medicine

## 2022-06-18 ENCOUNTER — Other Ambulatory Visit: Payer: Self-pay

## 2022-06-18 DIAGNOSIS — I10 Essential (primary) hypertension: Secondary | ICD-10-CM | POA: Diagnosis present

## 2022-06-18 DIAGNOSIS — Z743 Need for continuous supervision: Secondary | ICD-10-CM | POA: Diagnosis not present

## 2022-06-18 DIAGNOSIS — A419 Sepsis, unspecified organism: Principal | ICD-10-CM

## 2022-06-18 DIAGNOSIS — Z66 Do not resuscitate: Secondary | ICD-10-CM | POA: Diagnosis present

## 2022-06-18 DIAGNOSIS — R64 Cachexia: Secondary | ICD-10-CM | POA: Diagnosis present

## 2022-06-18 DIAGNOSIS — E1165 Type 2 diabetes mellitus with hyperglycemia: Secondary | ICD-10-CM | POA: Diagnosis present

## 2022-06-18 DIAGNOSIS — R627 Adult failure to thrive: Secondary | ICD-10-CM | POA: Diagnosis present

## 2022-06-18 DIAGNOSIS — J9621 Acute and chronic respiratory failure with hypoxia: Secondary | ICD-10-CM | POA: Diagnosis present

## 2022-06-18 DIAGNOSIS — Z8501 Personal history of malignant neoplasm of esophagus: Secondary | ICD-10-CM

## 2022-06-18 DIAGNOSIS — R296 Repeated falls: Secondary | ICD-10-CM | POA: Diagnosis present

## 2022-06-18 DIAGNOSIS — Z79899 Other long term (current) drug therapy: Secondary | ICD-10-CM

## 2022-06-18 DIAGNOSIS — K219 Gastro-esophageal reflux disease without esophagitis: Secondary | ICD-10-CM | POA: Diagnosis present

## 2022-06-18 DIAGNOSIS — J850 Gangrene and necrosis of lung: Secondary | ICD-10-CM | POA: Diagnosis present

## 2022-06-18 DIAGNOSIS — Z8042 Family history of malignant neoplasm of prostate: Secondary | ICD-10-CM

## 2022-06-18 DIAGNOSIS — E876 Hypokalemia: Secondary | ICD-10-CM | POA: Diagnosis present

## 2022-06-18 DIAGNOSIS — Z1152 Encounter for screening for COVID-19: Secondary | ICD-10-CM | POA: Diagnosis not present

## 2022-06-18 DIAGNOSIS — J690 Pneumonitis due to inhalation of food and vomit: Secondary | ICD-10-CM | POA: Diagnosis present

## 2022-06-18 DIAGNOSIS — Z87891 Personal history of nicotine dependence: Secondary | ICD-10-CM | POA: Diagnosis not present

## 2022-06-18 DIAGNOSIS — Z931 Gastrostomy status: Secondary | ICD-10-CM

## 2022-06-18 DIAGNOSIS — G9341 Metabolic encephalopathy: Secondary | ICD-10-CM | POA: Diagnosis present

## 2022-06-18 DIAGNOSIS — E1140 Type 2 diabetes mellitus with diabetic neuropathy, unspecified: Secondary | ICD-10-CM | POA: Diagnosis present

## 2022-06-18 DIAGNOSIS — I499 Cardiac arrhythmia, unspecified: Secondary | ICD-10-CM | POA: Diagnosis not present

## 2022-06-18 DIAGNOSIS — R6520 Severe sepsis without septic shock: Secondary | ICD-10-CM | POA: Diagnosis not present

## 2022-06-18 DIAGNOSIS — E43 Unspecified severe protein-calorie malnutrition: Secondary | ICD-10-CM | POA: Diagnosis present

## 2022-06-18 DIAGNOSIS — I959 Hypotension, unspecified: Secondary | ICD-10-CM | POA: Diagnosis present

## 2022-06-18 DIAGNOSIS — I9589 Other hypotension: Secondary | ICD-10-CM | POA: Diagnosis present

## 2022-06-18 DIAGNOSIS — C159 Malignant neoplasm of esophagus, unspecified: Secondary | ICD-10-CM | POA: Diagnosis present

## 2022-06-18 DIAGNOSIS — R410 Disorientation, unspecified: Secondary | ICD-10-CM | POA: Diagnosis present

## 2022-06-18 DIAGNOSIS — Z91148 Patient's other noncompliance with medication regimen for other reason: Secondary | ICD-10-CM

## 2022-06-18 DIAGNOSIS — J9600 Acute respiratory failure, unspecified whether with hypoxia or hypercapnia: Principal | ICD-10-CM

## 2022-06-18 DIAGNOSIS — D509 Iron deficiency anemia, unspecified: Secondary | ICD-10-CM | POA: Diagnosis present

## 2022-06-18 DIAGNOSIS — Z91199 Patient's noncompliance with other medical treatment and regimen due to unspecified reason: Secondary | ICD-10-CM

## 2022-06-18 DIAGNOSIS — Z800 Family history of malignant neoplasm of digestive organs: Secondary | ICD-10-CM

## 2022-06-18 DIAGNOSIS — Z515 Encounter for palliative care: Secondary | ICD-10-CM | POA: Diagnosis not present

## 2022-06-18 DIAGNOSIS — E162 Hypoglycemia, unspecified: Secondary | ICD-10-CM | POA: Diagnosis present

## 2022-06-18 DIAGNOSIS — Z91014 Allergy to mammalian meats: Secondary | ICD-10-CM

## 2022-06-18 DIAGNOSIS — R0902 Hypoxemia: Secondary | ICD-10-CM | POA: Diagnosis not present

## 2022-06-18 DIAGNOSIS — Z681 Body mass index (BMI) 19 or less, adult: Secondary | ICD-10-CM | POA: Diagnosis not present

## 2022-06-18 DIAGNOSIS — Z720 Tobacco use: Secondary | ICD-10-CM | POA: Diagnosis present

## 2022-06-18 LAB — URINALYSIS, W/ REFLEX TO CULTURE (INFECTION SUSPECTED)
Bilirubin Urine: NEGATIVE
Glucose, UA: 50 mg/dL — AB
Ketones, ur: NEGATIVE mg/dL
Leukocytes,Ua: NEGATIVE
Nitrite: NEGATIVE
Protein, ur: NEGATIVE mg/dL
Specific Gravity, Urine: 1.012 (ref 1.005–1.030)
pH: 6 (ref 5.0–8.0)

## 2022-06-18 LAB — COMPREHENSIVE METABOLIC PANEL
ALT: 28 U/L (ref 0–44)
AST: 31 U/L (ref 15–41)
Albumin: <1.5 g/dL — ABNORMAL LOW (ref 3.5–5.0)
Alkaline Phosphatase: 170 U/L — ABNORMAL HIGH (ref 38–126)
Anion gap: 4 — ABNORMAL LOW (ref 5–15)
BUN: 19 mg/dL (ref 6–20)
CO2: 21 mmol/L — ABNORMAL LOW (ref 22–32)
Calcium: 7.5 mg/dL — ABNORMAL LOW (ref 8.9–10.3)
Chloride: 105 mmol/L (ref 98–111)
Creatinine, Ser: 0.6 mg/dL — ABNORMAL LOW (ref 0.61–1.24)
GFR, Estimated: >60 mL/min (ref >60)
Glucose, Bld: 427 mg/dL — ABNORMAL HIGH (ref 70–99)
Potassium: 3 mmol/L — ABNORMAL LOW (ref 3.5–5.1)
Sodium: 130 mmol/L — ABNORMAL LOW (ref 135–145)
Total Bilirubin: 0.7 mg/dL (ref 0.3–1.2)
Total Protein: 4.5 g/dL — ABNORMAL LOW (ref 6.5–8.1)

## 2022-06-18 LAB — CBC WITH DIFFERENTIAL/PLATELET
Abs Immature Granulocytes: 0.5 10*3/uL — ABNORMAL HIGH (ref 0.00–0.07)
Band Neutrophils: 24 %
Basophils Absolute: 0 10*3/uL (ref 0.0–0.1)
Basophils Relative: 0 %
Eosinophils Absolute: 0 10*3/uL (ref 0.0–0.5)
Eosinophils Relative: 0 %
HCT: 28.2 % — ABNORMAL LOW (ref 39.0–52.0)
Hemoglobin: 9.3 g/dL — ABNORMAL LOW (ref 13.0–17.0)
Lymphocytes Relative: 10 %
Lymphs Abs: 0.8 10*3/uL (ref 0.7–4.0)
MCH: 24.8 pg — ABNORMAL LOW (ref 26.0–34.0)
MCHC: 33 g/dL (ref 30.0–36.0)
MCV: 75.2 fL — ABNORMAL LOW (ref 80.0–100.0)
Metamyelocytes Relative: 6 %
Monocytes Absolute: 0.1 10*3/uL (ref 0.1–1.0)
Monocytes Relative: 1 %
Neutro Abs: 6.9 10*3/uL (ref 1.7–7.7)
Neutrophils Relative %: 59 %
Platelets: 182 10*3/uL (ref 150–400)
RBC: 3.75 MIL/uL — ABNORMAL LOW (ref 4.22–5.81)
RDW: 18.3 % — ABNORMAL HIGH (ref 11.5–15.5)
WBC Morphology: INCREASED
WBC: 8.3 10*3/uL (ref 4.0–10.5)
nRBC: 0 % (ref 0.0–0.2)

## 2022-06-18 LAB — BASIC METABOLIC PANEL
Anion gap: 7 (ref 5–15)
BUN: 19 mg/dL (ref 6–20)
CO2: 20 mmol/L — ABNORMAL LOW (ref 22–32)
Calcium: 7.2 mg/dL — ABNORMAL LOW (ref 8.9–10.3)
Chloride: 112 mmol/L — ABNORMAL HIGH (ref 98–111)
Creatinine, Ser: 0.53 mg/dL — ABNORMAL LOW (ref 0.61–1.24)
GFR, Estimated: >60 mL/min (ref >60)
Glucose, Bld: 96 mg/dL (ref 70–99)
Potassium: 2.7 mmol/L — CL (ref 3.5–5.1)
Sodium: 139 mmol/L (ref 135–145)

## 2022-06-18 LAB — RESP PANEL BY RT-PCR (RSV, FLU A&B, COVID)  RVPGX2
Influenza A by PCR: NEGATIVE
Influenza B by PCR: NEGATIVE
Resp Syncytial Virus by PCR: NEGATIVE
SARS Coronavirus 2 by RT PCR: NEGATIVE

## 2022-06-18 LAB — BLOOD GAS, ARTERIAL
Acid-base deficit: 6 mmol/L — ABNORMAL HIGH (ref 0.0–2.0)
Bicarbonate: 21.5 mmol/L (ref 20.0–28.0)
Drawn by: 30163
O2 Saturation: 90.4 %
Patient temperature: 34.8
pCO2 arterial: 45 mmHg (ref 32–48)
pH, Arterial: 7.28 — ABNORMAL LOW (ref 7.35–7.45)
pO2, Arterial: 48 mmHg — ABNORMAL LOW (ref 83–108)

## 2022-06-18 LAB — CBG MONITORING, ED
Glucose-Capillary: 31 mg/dL — CL (ref 70–99)
Glucose-Capillary: 93 mg/dL (ref 70–99)
Glucose-Capillary: 95 mg/dL (ref 70–99)
Glucose-Capillary: 102 mg/dL — ABNORMAL HIGH (ref 70–99)
Glucose-Capillary: 66 mg/dL — ABNORMAL LOW (ref 70–99)
Glucose-Capillary: 125 mg/dL — ABNORMAL HIGH (ref 70–99)

## 2022-06-18 LAB — PROTIME-INR
INR: 1.7 — ABNORMAL HIGH (ref 0.8–1.2)
Prothrombin Time: 19.8 seconds — ABNORMAL HIGH (ref 11.4–15.2)

## 2022-06-18 LAB — LACTIC ACID, PLASMA
Lactic Acid, Venous: 2.6 mmol/L (ref 0.5–1.9)
Lactic Acid, Venous: 2 mmol/L (ref 0.5–1.9)

## 2022-06-18 LAB — APTT: aPTT: 32 seconds (ref 24–36)

## 2022-06-18 LAB — HIV ANTIBODY (ROUTINE TESTING W REFLEX): HIV Screen 4th Generation wRfx: NONREACTIVE

## 2022-06-18 MED ORDER — GLYCOPYRROLATE 0.2 MG/ML IJ SOLN: 0.2000 mg | INTRAMUSCULAR | Status: DC | PRN

## 2022-06-18 MED ORDER — GLYCOPYRROLATE 1 MG PO TABS: 1.0000 mg | ORAL_TABLET | ORAL | Status: DC | PRN

## 2022-06-18 MED ORDER — SODIUM CHLORIDE 0.9% FLUSH: 3.0000 mL | INTRAVENOUS | Status: DC | PRN

## 2022-06-18 MED ORDER — LORAZEPAM 2 MG/ML PO CONC: 1.0000 mg | ORAL | Status: DC | PRN

## 2022-06-18 MED ORDER — NICOTINE 14 MG/24HR TD PT24: 14.0000 mg | MEDICATED_PATCH | Freq: Every day | TRANSDERMAL | Status: DC | PRN

## 2022-06-18 MED ORDER — ISOSOURCE 1.5 CAL PO LIQD: 1000.0000 mL | ORAL | Status: DC

## 2022-06-18 MED ORDER — POTASSIUM CHLORIDE 10 MEQ/100ML IV SOLN
10.0000 meq | INTRAVENOUS | Status: AC
  Administered 2022-06-18 (×4): 10 meq via INTRAVENOUS
  Filled 2022-06-18 (×4): qty 100

## 2022-06-18 MED ORDER — OMEPRAZOLE 2 MG/ML ORAL SUSPENSION: 20.0000 mg | Freq: Every day | ORAL | Status: DC

## 2022-06-18 MED ORDER — POTASSIUM CHLORIDE 20 MEQ PO PACK: 60.0000 meq | PACK | Freq: Two times a day (BID) | ORAL | Status: DC

## 2022-06-18 MED ORDER — FENTANYL CITRATE PF 50 MCG/ML IJ SOSY
50.0000 ug | PREFILLED_SYRINGE | INTRAMUSCULAR | Status: DC | PRN
  Administered 2022-06-18 (×2): 100 ug via INTRAVENOUS
  Filled 2022-06-18 (×2): qty 2

## 2022-06-18 MED ORDER — DEXTROSE IN LACTATED RINGERS 5 % IV SOLN: INTRAVENOUS | Status: DC

## 2022-06-18 MED ORDER — GABAPENTIN 250 MG/5ML PO SOLN
300.0000 mg | Freq: Every day | ORAL | Status: DC
  Filled 2022-06-18: qty 6

## 2022-06-18 MED ORDER — MORPHINE 100MG IN NS 100ML (1MG/ML) PREMIX INFUSION
1.0000 mg/h | INTRAVENOUS | Status: DC
  Administered 2022-06-18: 1 mg/h via INTRAVENOUS
  Filled 2022-06-18: qty 100

## 2022-06-18 MED ORDER — FAMOTIDINE 20 MG PO TABS: 20.0000 mg | ORAL_TABLET | Freq: Two times a day (BID) | ORAL | Status: DC

## 2022-06-18 MED ORDER — VANCOMYCIN HCL IN DEXTROSE 1-5 GM/200ML-% IV SOLN
1000.0000 mg | Freq: Once | INTRAVENOUS | Status: AC
  Administered 2022-06-18: 1000 mg via INTRAVENOUS
  Filled 2022-06-18: qty 200

## 2022-06-18 MED ORDER — SODIUM CHLORIDE 0.9 % IV BOLUS
2000.0000 mL | Freq: Once | INTRAVENOUS | Status: AC
  Administered 2022-06-18: 2000 mL via INTRAVENOUS

## 2022-06-18 MED ORDER — DEXTROSE 10 % IV SOLN: INTRAVENOUS | Status: DC

## 2022-06-18 MED ORDER — SODIUM CHLORIDE 0.9 % IV SOLN
250.0000 mL | INTRAVENOUS | Status: DC | PRN
  Administered 2022-06-18: 250 mL via INTRAVENOUS

## 2022-06-18 MED ORDER — LOPERAMIDE HCL 1 MG/7.5ML PO SUSP: 4.0000 mg | Freq: Every day | ORAL | Status: DC | PRN

## 2022-06-18 MED ORDER — FERROUS SULFATE 300 (60 FE) MG/5ML PO SOLN
300.0000 mg | Freq: Every day | ORAL | Status: DC
  Filled 2022-06-18: qty 5

## 2022-06-18 MED ORDER — ROCURONIUM BROMIDE 10 MG/ML (PF) SYRINGE
PREFILLED_SYRINGE | INTRAVENOUS | Status: AC
  Filled 2022-06-18: qty 10

## 2022-06-18 MED ORDER — MIDODRINE HCL 5 MG PO TABS: 5.0000 mg | ORAL_TABLET | Freq: Three times a day (TID) | ORAL | Status: DC

## 2022-06-18 MED ORDER — BIOTENE DRY MOUTH MT LIQD: 15.0000 mL | OROMUCOSAL | Status: DC | PRN

## 2022-06-18 MED ORDER — SUCCINYLCHOLINE CHLORIDE 200 MG/10ML IV SOSY
PREFILLED_SYRINGE | INTRAVENOUS | Status: AC
  Filled 2022-06-18: qty 10

## 2022-06-18 MED ORDER — FREE WATER: 200.0000 mL | Freq: Four times a day (QID) | Status: DC

## 2022-06-18 MED ORDER — ALLOPURINOL 300 MG PO TABS: 300.0000 mg | ORAL_TABLET | Freq: Every day | ORAL | Status: DC

## 2022-06-18 MED ORDER — FERROUS SULFATE 325 (65 FE) MG PO TBEC: 325.0000 mg | DELAYED_RELEASE_TABLET | ORAL | Status: DC

## 2022-06-18 MED ORDER — VANCOMYCIN HCL 750 MG/150ML IV SOLN
750.0000 mg | Freq: Two times a day (BID) | INTRAVENOUS | Status: DC
  Filled 2022-06-18 (×2): qty 150

## 2022-06-18 MED ORDER — ACETAMINOPHEN 325 MG PO TABS: 650.0000 mg | ORAL_TABLET | Freq: Four times a day (QID) | ORAL | Status: DC | PRN

## 2022-06-18 MED ORDER — DEXTROSE 50 % IV SOLN
INTRAVENOUS | Status: AC
  Administered 2022-06-18: 50 mL
  Filled 2022-06-18: qty 50

## 2022-06-18 MED ORDER — LORAZEPAM 2 MG/ML IJ SOLN
1.0000 mg | INTRAMUSCULAR | Status: DC | PRN
  Administered 2022-06-18: 1 mg via INTRAVENOUS
  Filled 2022-06-18: qty 1

## 2022-06-18 MED ORDER — SUCCINYLCHOLINE CHLORIDE 20 MG/ML IJ SOLN
INTRAMUSCULAR | Status: DC | PRN
  Administered 2022-06-18: 120 mg via INTRAVENOUS

## 2022-06-18 MED ORDER — PROPOFOL 1000 MG/100ML IV EMUL
0.0000 ug/kg/min | INTRAVENOUS | Status: DC
  Administered 2022-06-18: 5 ug/kg/min via INTRAVENOUS
  Filled 2022-06-18: qty 100

## 2022-06-18 MED ORDER — LORAZEPAM 1 MG PO TABS: 1.0000 mg | ORAL_TABLET | ORAL | Status: DC | PRN

## 2022-06-18 MED ORDER — MAGNESIUM OXIDE -MG SUPPLEMENT 400 (240 MG) MG PO TABS: 400.0000 mg | ORAL_TABLET | Freq: Two times a day (BID) | ORAL | Status: DC

## 2022-06-18 MED ORDER — INSULIN ASPART 100 UNIT/ML IJ SOLN: 0.0000 [IU] | INTRAMUSCULAR | Status: DC

## 2022-06-18 MED ORDER — SODIUM CHLORIDE 0.9 % IV SOLN: 2.0000 g | Freq: Three times a day (TID) | INTRAVENOUS | Status: DC

## 2022-06-18 MED ORDER — ETOMIDATE 2 MG/ML IV SOLN
INTRAVENOUS | Status: AC
  Filled 2022-06-18: qty 10

## 2022-06-18 MED ORDER — ACETAMINOPHEN 650 MG RE SUPP: 650.0000 mg | Freq: Four times a day (QID) | RECTAL | Status: DC | PRN

## 2022-06-18 MED ORDER — SODIUM CHLORIDE 0.9% FLUSH: 3.0000 mL | Freq: Two times a day (BID) | INTRAVENOUS | Status: DC

## 2022-06-18 MED ORDER — ONDANSETRON HCL 4 MG PO TABS: 4.0000 mg | ORAL_TABLET | Freq: Four times a day (QID) | ORAL | Status: DC | PRN

## 2022-06-18 MED ORDER — FOLIC ACID 1 MG PO TABS: 1.0000 mg | ORAL_TABLET | Freq: Every day | ORAL | Status: DC

## 2022-06-18 MED ORDER — JEVITY 1.2 CAL PO LIQD
1000.0000 mL | ORAL | Status: DC
  Filled 2022-06-18 (×2): qty 1000

## 2022-06-18 MED ORDER — ALBUTEROL SULFATE (2.5 MG/3ML) 0.083% IN NEBU: 3.0000 mL | INHALATION_SOLUTION | RESPIRATORY_TRACT | Status: DC | PRN

## 2022-06-18 MED ORDER — ONDANSETRON HCL 4 MG PO TABS: 8.0000 mg | ORAL_TABLET | Freq: Four times a day (QID) | ORAL | Status: DC | PRN

## 2022-06-18 MED ORDER — ONDANSETRON HCL 4 MG/2ML IJ SOLN: 4.0000 mg | Freq: Four times a day (QID) | INTRAMUSCULAR | Status: DC | PRN

## 2022-06-18 MED ORDER — FAMOTIDINE 20 MG PO TABS: 20.0000 mg | ORAL_TABLET | Freq: Every day | ORAL | Status: DC

## 2022-06-18 MED ORDER — GLYCOPYRROLATE 0.2 MG/ML IJ SOLN
0.2000 mg | INTRAMUSCULAR | Status: DC | PRN
  Administered 2022-06-18: 0.2 mg via INTRAVENOUS
  Filled 2022-06-18: qty 1

## 2022-06-18 MED ORDER — SODIUM CHLORIDE 0.9 % IV SOLN
2.0000 g | Freq: Once | INTRAVENOUS | Status: AC
  Administered 2022-06-18: 2 g via INTRAVENOUS
  Filled 2022-06-18: qty 12.5

## 2022-06-18 MED ORDER — MEGESTROL ACETATE 400 MG/10ML PO SUSP: 400.0000 mg | Freq: Two times a day (BID) | ORAL | Status: DC

## 2022-06-18 MED ORDER — DOCUSATE SODIUM 50 MG/5ML PO LIQD: 100.0000 mg | Freq: Two times a day (BID) | ORAL | Status: DC

## 2022-06-18 MED ORDER — POLYETHYLENE GLYCOL 3350 17 G PO PACK: 17.0000 g | PACK | Freq: Every day | ORAL | Status: DC

## 2022-06-18 MED ORDER — POLYVINYL ALCOHOL 1.4 % OP SOLN: 1.0000 [drp] | Freq: Four times a day (QID) | OPHTHALMIC | Status: DC | PRN

## 2022-06-18 MED ORDER — FENTANYL CITRATE PF 50 MCG/ML IJ SOSY: 50.0000 ug | PREFILLED_SYRINGE | INTRAMUSCULAR | Status: DC | PRN

## 2022-06-18 MED ORDER — ETOMIDATE 2 MG/ML IV SOLN
INTRAVENOUS | Status: DC | PRN
  Administered 2022-06-18: 20 mg via INTRAVENOUS

## 2022-06-20 ENCOUNTER — Encounter: Payer: Medicaid Other | Admitting: *Deleted

## 2022-06-23 DIAGNOSIS — Z419 Encounter for procedure for purposes other than remedying health state, unspecified: Secondary | ICD-10-CM | POA: Diagnosis not present

## 2022-06-23 LAB — BLOOD CULTURE ID PANEL (REFLEXED) - BCID2

## 2022-06-23 LAB — CULTURE, BLOOD (ROUTINE X 2)
Culture: NO GROWTH
Special Requests: ADEQUATE

## 2022-06-23 NOTE — ED Notes (Signed)
ETT making grunting sound so MD and RT are going to extubate and reintubate.

## 2022-06-23 NOTE — ED Notes (Addendum)
EMS gave IM glucagon due to CBG 50 Initially 79% RA EMS bagged until got h I'm to 90% BVM

## 2022-06-23 NOTE — ED Notes (Signed)
Pt's brother notified of pt's death.

## 2022-06-23 NOTE — ED Notes (Signed)
Brother is speaking with Dr Manuella Ghazi about making patient comfort care.

## 2022-06-23 NOTE — H&P (Addendum)
History and Physical    Trevor [name] [id number] DOB: 06-17-1970 DOA: 06/15/2022  PCP: Hal Morales, DO   Patient coming from: SNF  Chief Complaint: Bilateral crackles and refusal of tube feeds with hyperglycemia and confusion  HPI: Trevor [name] is a 52 y.o. 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  He was recently discharged on 2/12 after treatment for sepsis related to aspiration/necrotizing pneumonia and was instructed to remain on Augmentin for 21 more days after receiving 7 days of IV Unasyn.  He apparently insists on oral feedings despite the risks of aspiration and has refused tube feeds and became quite hypoglycemic with blood glucose in the 30 range.  He was also noted to be hypothermic likely related to this.   ED Course: Vital signs with hypothermia temperature 94.6 F with pulse 106.  Hemoglobin 9.3 and potassium of 3.  Lactic acid 2.6.  Chest x-ray with multifocal pneumonia and right-sided pleural effusion noted.  He was started on vancomycin and cefepime and given D50 for his low blood glucose readings.  He continues to require higher amounts of oxygen and is currently on high flow nasal cannula oxygen.  Flu and COVID testing negative.  Review of Systems: Reviewed as noted above, otherwise negative.  Past Medical History:  Diagnosis Date   Diabetes mellitus without complication ([place])    Esophageal cancer ([place])    Hypertension     Past Surgical History:  Procedure Laterality Date   BIOPSY  08/26/2021    Procedure: BIOPSY;  Surgeon: Eloise Harman, DO;  [facility];  Service: Endoscopy;;   BRONCHIAL BIOPSY  01/23/2022   Procedure: BRONCHIAL BIOPSIES;  Surgeon: Collene Gobble, MD;  Location: [facility] ENDOSCOPY;  Service: Pulmonary;;   BRONCHIAL BRUSHINGS  01/23/2022   Procedure: BRONCHIAL BRUSHINGS;  Surgeon: Collene Gobble, MD;  [facility] ENDOSCOPY;  Service: Pulmonary;;   BRONCHIAL NEEDLE ASPIRATION BIOPSY  01/23/2022   Procedure: BRONCHIAL NEEDLE ASPIRATION BIOPSIES;  Surgeon: Collene Gobble, MD;  Location: [facility] ENDOSCOPY;  Service: Pulmonary;;   BRONCHIAL WASHINGS  01/23/2022   Procedure: BRONCHIAL WASHINGS;  Surgeon: Collene Gobble, MD;  Location: [facility] ENDOSCOPY;  Service: Pulmonary;;   ESOPHAGOGASTRODUODENOSCOPY (EGD) WITH PROPOFOL N/A 08/26/2021   Procedure: ESOPHAGOGASTRODUODENOSCOPY (EGD) WITH PROPOFOL;  Surgeon: Eloise Harman, DO;  [facility];  Service: Endoscopy;  Laterality: N/A;  2:00pm   GASTROSTOMY N/A 10/12/2021   Procedure: OPEN GASTROSTOMY TUBE;  Surgeon: Aviva Signs, MD;  Location: [facility] ORS;  Service: General;  Laterality: N/A;   IR [name] GASTRO/COLONIC TUBE PERCUT W/FLUORO  03/10/2022   PORTACATH PLACEMENT Left 10/12/2021   Procedure: INSERTION PORT-A-CATH;  Surgeon: Aviva Signs, MD;  Location: [facility] ORS;  Service: General;  Laterality: Left;   VIDEO BRONCHOSCOPY WITH RADIAL ENDOBRONCHIAL ULTRASOUND  01/23/2022   Procedure: VIDEO BRONCHOSCOPY WITH RADIAL ENDOBRONCHIAL ULTRASOUND;  Surgeon: Collene Gobble, MD;  Location: [facility] ENDOSCOPY;  Service: Pulmonary;;     reports that he has been smoking cigarettes. He has been smoking an average of .5 packs per day. He has never used smokeless tobacco. He reports that he  does not currently use alcohol after a past usage of about 14.0 standard drinks of alcohol per week. He reports that he does not use drugs.  Allergies  Allergen Reactions   Pork-Derived Products Other (See Comments)    Patient does not eat pork due  to his religious beliefs    Family History  Problem Relation Age of Onset   Cancer Father        prostate   Cancer Sister    Colon cancer Neg Hx    Esophageal cancer Neg Hx     Prior to Admission medications   Medication Sig Start Date End Date Taking? Authorizing Provider  albuterol (VENTOLIN HFA) 108 (90 Base) MCG/ACT inhaler Inhale 2 puffs into the lungs every 4 (four) hours as needed for wheezing or shortness of breath.    [provider]  allopurinol (ZYLOPRIM) 300 MG tablet Take 1 tablet (300 mg total) by mouth daily. Patient taking differently: Place 300 mg into feeding tube daily. 08/04/21   Sanjuana Kava, MD  amoxicillin-clavulanate (AUGMENTIN) 875-125 MG tablet Take 1 tablet by mouth every 12 (twelve) hours. X 21 more days 06/05/22   Orson Eva, MD  ferrous sulfate 325 (65 FE) MG EC tablet Take 1 tablet (325 mg total) by mouth daily with breakfast. Patient taking differently: 325 mg See admin instructions. 325 mg once daily via tube 03/23/22   Roxan Hockey, MD  folic acid (FOLVITE) 1 MG tablet Place 1 tablet (1 mg total) into feeding tube daily. 03/13/22   Orson Eva, MD  gabapentin (NEURONTIN) 250 MG/5ML solution Place 6 mLs (300 mg total) into feeding tube at bedtime. Patient taking differently: Place 300 mg into feeding tube daily. 02/22/22   Pokhrel, Corrie Mckusick, MD  loperamide (IMODIUM) 2 MG capsule 4 mg See admin instructions. 4 mg via g-tube as needed for diarrhea after each loose stool. Maximum of 8 capsules per day.    [provider]  magnesium oxide (MAG-OX) 400 (240 Mg) MG tablet Place 400 mg into feeding tube 2 (two) times daily.    [provider]  megestrol (MEGACE) 400 MG/10ML suspension Take 10 mLs (400 mg total) by mouth 2 (two) times daily. Patient taking differently: Place 400 mg into feeding tube 2 (two) times daily. 12/07/21   Derek Jack, MD  midodrine (PROAMATINE) 5 MG tablet Place 1 tablet (5 mg total) into feeding tube 3  (three) times daily with meals. Patient taking differently: Place 5 mg into feeding tube 3 (three) times daily. 03/12/22   Orson Eva, MD  Multiple Vitamins-Minerals (MULTIVITAMIN WITH MINERALS) tablet Take 1 tablet by mouth daily. Patient taking differently: Place 1 tablet into feeding tube daily. 03/23/22 03/23/23  Roxan Hockey, MD  nicotine (NICODERM CQ - DOSED IN MG/24 HOURS) 14 mg/24hr patch Place 1 patch (14 mg total) onto the skin daily as needed (nicotine craving). 03/12/22   Orson Eva, MD  Nutritional Supplements (ISOSOURCE 1.5 CAL) LIQD 1,000 mLs by Feeding Tube route See admin instructions. Isosource 1.5. Rate: 55 mLs/hour for 24 hours/day. Administer 1000 mLs daily.    [provider]  omeprazole (KONVOMEP) 2 mg/mL SUSP oral suspension Place 20 mg into feeding tube daily.    [provider]  ondansetron (ZOFRAN) 8 MG tablet Place 8 mg into feeding tube every 6 (six) hours as needed for nausea or vomiting.    [provider]  potassium chloride SA (KLOR-CON M20) 20 MEQ tablet Take 1 tablet (20 mEq total) by mouth daily. Patient taking differently:  20 mEq daily. Per tube 03/27/22   Roxan Hockey, MD  Water For Irrigation, Sterile (FREE WATER) SOLN Place 200 mLs into feeding tube every 6 (six) hours. Patient taking differently: Place 30-200 mLs into feeding tube See admin instructions. 2 entries on MAR: Flush feeding tube with a minimum of 30 mLs of water between each medication Flush feeding tube with 200 mLs of water every 8 hours 03/23/22   Roxan Hockey, MD    Physical Exam: Vitals:   06/09/2022 1145 06/14/2022 1145 06/06/2022 1147 06/07/2022 1200  BP:    (!) 132/94  Pulse:    (!) 106  Resp:    15  Temp: (!) 94.6 F (34.8 C)     TempSrc: Rectal     SpO2:  91% 91% 91%  Weight:      Height:        Constitutional: NAD, calm, comfortable, thin/cachectic Vitals:   05/25/2022 1145 06/15/2022 1145 06/13/2022 1147 05/30/2022 1200  BP:    (!) 132/94  Pulse:     (!) 106  Resp:    15  Temp: (!) 94.6 F (34.8 C)     TempSrc: Rectal     SpO2:  91% 91% 91%  Weight:      Height:       Eyes: lids and conjunctivae normal Neck: normal, supple Respiratory: clear to auscultation bilaterally. Normal respiratory effort. No accessory muscle use.  Chest port present, currently on nonrebreather Cardiovascular: Regular rate and rhythm, no murmurs. Abdomen: no tenderness, no distention. Bowel sounds positive.  PEG tube present Musculoskeletal:  No edema. Skin: no rashes, lesions, ulcers.  Psychiatric: Flat affect  Labs on Admission: I have personally reviewed following labs and imaging studies  CBC: Recent Labs  Lab 06/13/2022 1122  WBC 8.3  NEUTROABS 6.9  HGB 9.3*  HCT 28.2*  MCV 75.2*  PLT [id number]   Basic Metabolic Panel: Recent Labs  Lab 06/13/2022 1122  NA 130*  K 3.0*  CL 105  CO2 21*  GLUCOSE 427*  BUN 19  CREATININE 0.60*  CALCIUM 7.5*   GFR: Estimated Creatinine Clearance: 62.3 mL/min (A) (by C-G formula based on SCr of 0.6 mg/dL (L)). Liver Function Tests: Recent Labs  Lab 06/21/2022 1122  AST 31  ALT 28  ALKPHOS 170*  BILITOT 0.7  PROT 4.5*  ALBUMIN <1.5*   No results for input(s): "LIPASE", "AMYLASE" in the last 168 hours. No results for input(s): "AMMONIA" in the last 168 hours. Coagulation Profile: Recent Labs  Lab 05/30/2022 1122  INR 1.7*   Cardiac Enzymes: No results for input(s): "CKTOTAL", "CKMB", "CKMBINDEX", "TROPONINI" in the last 168 hours. BNP (last 3 results) No results for input(s): "PROBNP" in the last 8760 hours. HbA1C: No results for input(s): "HGBA1C" in the last 72 hours. CBG: Recent Labs  Lab 06/17/2022 1115 05/27/2022 1142 06/04/2022 1150  GLUCAP 31* 93 95   Lipid Profile: No results for input(s): "CHOL", "HDL", "LDLCALC", "TRIG", "CHOLHDL", "LDLDIRECT" in the last 72 hours. Thyroid Function Tests: No results for input(s): "TSH", "T4TOTAL", "FREET4", "T3FREE", "THYROIDAB" in the last 72  hours. Anemia Panel: No results for input(s): "VITAMINB12", "FOLATE", "FERRITIN", "TIBC", "IRON", "RETICCTPCT" in the last 72 hours. Urine analysis:    Component Value Date/Time   COLORURINE AMBER (A) 02/16/2022 0500   APPEARANCEUR CLOUDY (A) 02/16/2022 0500   LABSPEC >1.046 (H) 02/16/2022 0500   PHURINE 5.0 02/16/2022 0500   GLUCOSEU NEGATIVE 02/16/2022 0500   HGBUR NEGATIVE 02/16/2022 0500   BILIRUBINUR SMALL (A) 02/16/2022  0500   KETONESUR NEGATIVE 02/16/2022 0500   PROTEINUR 30 (A) 02/16/2022 0500   NITRITE NEGATIVE 02/16/2022 0500   LEUKOCYTESUR NEGATIVE 02/16/2022 0500    Radiological Exams on Admission: DG Chest Port 1 View  Result Date: 05/27/2022 CLINICAL DATA:  52 year old male presents for evaluation of questionable sepsis. EXAM: PORTABLE CHEST 1 VIEW COMPARISON:  May 28, 2022 angio chest from May 28, 2022 as well. FINDINGS: LEFT-sided Port-A-Cath the area of the low screw vena cava. EKG leads project over the chest. Cardiomediastinal contours and hilar structures grossly stable. Marked worsening of nodular parenchymal opacities mixed with interstitial and airspace disease since previous imaging. Likely associated with enlargement of RIGHT-sided pleural effusion. New nodules throughout the LEFT chest and areas of near confluent airspace disease since prior imaging. On limited assessment no acute skeletal process. IMPRESSION: 1. Marked interval worsening of multifocal pneumonia associated with RIGHT-sided pleural effusion. Electronically Signed   By: Zetta Bills M.D.   On: 06/12/2022 12:22    EKG: Independently reviewed. ST 132 bpm.  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)   Hypokalemia   HTN (hypertension)   GERD (gastroesophageal reflux disease)   Tobacco abuse   Protein-calorie malnutrition, severe   Iron deficiency anemia   Hypotension   Hypoglycemia   FTT (failure to  thrive) in adult   Falls frequently   Aspiration pneumonia (HCC)   Acute on chronic respiratory failure with hypoxia (HCC)    Severe sepsis secondary to multifocal pneumonia -Recent discharge on 2/12 for aspiration pneumonia/necrotizing pneumonia and received prescription for Augmentin for 21 days after 7 days of IV Unasyn -Continue currently on vancomycin and cefepime for HCAP coverage -Unfortunately, patient appears to be noncompliant with medications and has not been taking tube feedings -Follow-up blood cultures obtained in ED  Acute on chronic hypoxemic respiratory failure secondary to above -He wears 2 L nasal cannula oxygen as needed -Concern for recurrent necrotizing pneumonia with aspiration -Recently noted to have COVID-19, currently negative -Failed nonrebreather and BiPAP and continued to remain severely hypoxemic requiring intubation -Transfer to PCCM service  Severe hypoglycemia -Secondary to refusal of tube feedings -Continue to monitor carefully and may require D5 IV fluid if continues to refuse -CBG monitoring every 4 hours  Acute metabolic encephalopathy-multifactorial -Likely secondary to hypoglycemia as well as severe sepsis  Chronic hypotension -Continue midodrine  Severe Hypokalemia -Secondary to poor oral intake -Has not been compliant with Osmolite due to diarrhea and therefore dietitian recommended Jevity 1.2 during last admission -Replete IV and p.o. and monitor in a.m. -Repeat dietitian evaluation  Severe malnutrition related to chronic illness -Patient continues to refuse PEG tube feedings -Appears to be trending towards failure to thrive with cancer history noted as well -Appreciate palliative consultation  Stage IVb metastatic squamous cell carcinoma -Followed by Dr. Delton Coombes -Prior palliative care discussion with denial about severity of illness and wants to remain full code  Frequent falls -PT evaluation -Currently at SNF -Fall  precautions  Failure to thrive -Poor prognosis long-term -Appreciate dietitian consultation -Appreciate palliative consultation for goals of care  Tobacco abuse -Nicotine patch -Counseled on tobacco cessation  Peripheral neuropathy -Secondary to prior alcohol use -Continue gabapentin   DVT prophylaxis: SCDs due to allergy from pork products Code Status: Full Family Communication: None at bedside Disposition Plan: Admit for treatment of sepsis with multifocal pneumonia Consults called: Palliative, PCCM Admission status: Inpatient, stepdown  Severity of Illness: The appropriate  patient status for this patient is INPATIENT. Inpatient status is judged to be reasonable and necessary in order to provide the required intensity of service to ensure the patient's safety. The patient's presenting symptoms, physical exam findings, and initial radiographic and laboratory data in the context of their chronic comorbidities is felt to place them at high risk for further clinical deterioration. Furthermore, it is not anticipated that the patient will be medically stable for discharge from the hospital within 2 midnights of admission.   * I certify that at the point of admission it is my clinical judgment that the patient will require inpatient hospital care spanning beyond 2 midnights from the point of admission due to high intensity of service, high risk for further deterioration and high frequency of surveillance required.*   Jadore Mcguffin D [place] Audia DO [facility]  If 7PM-7AM, please contact night-coverage [email or web address]  06/17/2022, 1:14 PM

## 2022-06-23 NOTE — ED Notes (Signed)
Dr Manuella Ghazi speaking with brother about critical state patient is in.

## 2022-06-23 NOTE — ED Provider Notes (Signed)
DEATH NOTE Called to bedside by patient's nurse due to likely patient death.  On my arrival, Trevor [name] was not moving or breathing spontaneously and was not responsive. On exam, vital signs were absent and there were no heart or breath sounds. Pupils were not reactive to light. Time of death was 07-09-42 PM on Jun 24, 2022 at the completion of my exam. Hospitalist caring for the patient Dr. Heath Lark will be notified. Decedent care was notified by the patient's nurse.  '@MECRED'$ @    Audley Hose, MD Jun 24, 2022 2048-07-08

## 2022-06-23 NOTE — ED Notes (Signed)
Patient appears emaciated. Patient did verbalize if he needs a breathing tube he would like to be tubed.

## 2022-06-23 NOTE — ED Notes (Signed)
Notified RT patient needs ot be placed on [facility]

## 2022-06-23 NOTE — ED Provider Notes (Signed)
[facility] Provider Note   CSN: [id number] Arrival date & time: 05/27/2022  1110     History {Add pertinent medical, surgical, social history, OB history to HPI:1} Chief Complaint  Patient presents with   Respiratory Distress    Trevor [name] is a 52 y.o. male.  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  He has a PEG tube.  He has not been taking his tube feedings for 4 days.  He presents with shortness of breath   Weakness      Home Medications Prior to Admission medications   Medication Sig Start Date End Date Taking? Authorizing Provider  albuterol (VENTOLIN HFA) 108 (90 Base) MCG/ACT inhaler Inhale 2 puffs into the lungs every 4 (four) hours as needed for wheezing or shortness of breath.    [provider]  allopurinol (ZYLOPRIM) 300 MG tablet Take 1 tablet (300 mg total) by mouth daily. Patient taking differently: Place 300 mg into feeding tube daily. 08/04/21   Sanjuana Kava, MD  amoxicillin-clavulanate (AUGMENTIN) 875-125 MG tablet Take 1 tablet by mouth every 12 (twelve) hours. X 21 more days 06/05/22   Orson Eva, MD  ferrous sulfate 325 (65 FE) MG EC tablet Take 1 tablet (325 mg total) by mouth daily with breakfast. Patient taking differently: 325 mg See admin instructions. 325 mg once daily via tube 03/23/22   Roxan Hockey, MD  folic acid (FOLVITE) 1 MG tablet Place 1 tablet (1 mg total) into feeding tube daily. 03/13/22   Orson Eva, MD  gabapentin (NEURONTIN) 250 MG/5ML solution Place 6 mLs (300 mg total) into feeding tube at bedtime. Patient taking differently: Place 300 mg into feeding tube daily. 02/22/22   Pokhrel, Corrie Mckusick, MD  loperamide (IMODIUM) 2 MG capsule 4 mg See admin instructions. 4 mg via g-tube as needed for diarrhea after each loose stool. Maximum of 8 capsules per day.    [provider]  magnesium oxide (MAG-OX) 400 (240 Mg) MG tablet Place 400 mg into feeding tube 2  (two) times daily.    [provider]  megestrol (MEGACE) 400 MG/10ML suspension Take 10 mLs (400 mg total) by mouth 2 (two) times daily. Patient taking differently: Place 400 mg into feeding tube 2 (two) times daily. 12/07/21   Derek Jack, MD  midodrine (PROAMATINE) 5 MG tablet Place 1 tablet (5 mg total) into feeding tube 3 (three) times daily with meals. Patient taking differently: Place 5 mg into feeding tube 3 (three) times daily. 03/12/22   Orson Eva, MD  Multiple Vitamins-Minerals (MULTIVITAMIN WITH MINERALS) tablet Take 1 tablet by mouth daily. Patient taking differently: Place 1 tablet into feeding tube daily. 03/23/22 03/23/23  Roxan Hockey, MD  nicotine (NICODERM CQ - DOSED IN MG/24 HOURS) 14 mg/24hr patch Place 1 patch (14 mg total) onto the skin daily as needed (nicotine craving). 03/12/22   Orson Eva, MD  Nutritional Supplements (ISOSOURCE 1.5 CAL) LIQD 1,000 mLs by Feeding Tube route See admin instructions. Isosource 1.5. Rate: 55 mLs/hour for 24 hours/day. Administer 1000 mLs daily.    [provider]  omeprazole (KONVOMEP) 2 mg/mL SUSP oral suspension Place 20 mg into feeding tube daily.    [provider]  ondansetron (ZOFRAN) 8 MG tablet Place 8 mg into feeding tube every 6 (six) hours as needed for nausea or vomiting.    [provider]  potassium chloride SA (KLOR-CON M20) 20 MEQ tablet Take 1 tablet (20 mEq total) by mouth  daily. Patient taking differently: 20 mEq daily. Per tube 03/27/22   Roxan Hockey, MD  Water For Irrigation, Sterile (FREE WATER) SOLN Place 200 mLs into feeding tube every 6 (six) hours. Patient taking differently: Place 30-200 mLs into feeding tube See admin instructions. 2 entries on MAR: Flush feeding tube with a minimum of 30 mLs of water between each medication Flush feeding tube with 200 mLs of water every 8 hours 03/23/22   Roxan Hockey, MD      Allergies    Pork-derived products    Review  of Systems   Review of Systems  Neurological:  Positive for weakness.    Physical Exam Updated Vital Signs BP 97/83 Comment: ETT  Pulse (!) 138 Comment: ETT  Temp (!) 94.6 F (34.8 C) (Rectal)   Resp 18 Comment: ETT  Ht '5\' 6"'$  (1.676 m)   Wt 41.7 kg   SpO2 (!) 89% Comment: ETT  BMI 14.85 kg/m  Physical Exam  ED Results / Procedures / Treatments   Labs (all labs ordered are listed, but only abnormal results are displayed) Labs Reviewed  LACTIC ACID, PLASMA - Abnormal; Notable for the following components:      Result Value   Lactic Acid, Venous 2.6 (*)    All other components within normal limits  LACTIC ACID, PLASMA - Abnormal; Notable for the following components:   Lactic Acid, Venous 2.0 (*)    All other components within normal limits  COMPREHENSIVE METABOLIC PANEL - Abnormal; Notable for the following components:   Sodium 130 (*)    Potassium 3.0 (*)    CO2 21 (*)    Glucose, Bld 427 (*)    Creatinine, Ser 0.60 (*)    Calcium 7.5 (*)    Total Protein 4.5 (*)    Albumin <1.5 (*)    Alkaline Phosphatase 170 (*)    Anion gap 4 (*)    All other components within normal limits  CBC WITH DIFFERENTIAL/PLATELET - Abnormal; Notable for the following components:   RBC 3.75 (*)    Hemoglobin 9.3 (*)    HCT 28.2 (*)    MCV 75.2 (*)    MCH 24.8 (*)    RDW 18.3 (*)    Abs Immature Granulocytes 0.50 (*)    All other components within normal limits  PROTIME-INR - Abnormal; Notable for the following components:   Prothrombin Time 19.8 (*)    INR 1.7 (*)    All other components within normal limits  URINALYSIS, W/ REFLEX TO CULTURE (INFECTION SUSPECTED) - Abnormal; Notable for the following components:   Glucose, UA 50 (*)    Hgb urine dipstick SMALL (*)    Bacteria, UA RARE (*)    All other components within normal limits  BASIC METABOLIC PANEL - Abnormal; Notable for the following components:   Potassium 2.7 (*)    Chloride 112 (*)    CO2 20 (*)    Creatinine, Ser  0.53 (*)    Calcium 7.2 (*)    All other components within normal limits  BLOOD GAS, ARTERIAL - Abnormal; Notable for the following components:   pH, Arterial 7.28 (*)    pO2, Arterial 48 (*)    Acid-base deficit 6.0 (*)    All other components within normal limits  CBG MONITORING, ED - Abnormal; Notable for the following components:   Glucose-Capillary 31 (*)    All other components within normal limits  CBG MONITORING, ED - Abnormal; Notable for the following components:  Glucose-Capillary 102 (*)    All other components within normal limits  RESP PANEL BY RT-PCR (RSV, FLU A&B, COVID)  [id number]  CULTURE, BLOOD (ROUTINE X 2)  CULTURE, BLOOD (ROUTINE X 2)  MRSA NEXT GEN BY PCR, NASAL  APTT  HIV ANTIBODY (ROUTINE TESTING W REFLEX)  CBG MONITORING, ED  CBG MONITORING, ED    EKG None  Radiology DG Chest Port 1 View  Result Date: 06/16/2022 CLINICAL DATA:  52 year old male presents for evaluation of questionable sepsis. EXAM: PORTABLE CHEST 1 VIEW COMPARISON:  May 28, 2022 angio chest from May 28, 2022 as well. FINDINGS: LEFT-sided Port-A-Cath the area of the low screw vena cava. EKG leads project over the chest. Cardiomediastinal contours and hilar structures grossly stable. Marked worsening of nodular parenchymal opacities mixed with interstitial and airspace disease since previous imaging. Likely associated with enlargement of RIGHT-sided pleural effusion. New nodules throughout the LEFT chest and areas of near confluent airspace disease since prior imaging. On limited assessment no acute skeletal process. IMPRESSION: 1. Marked interval worsening of multifocal pneumonia associated with RIGHT-sided pleural effusion. Electronically Signed   By: Zetta Bills M.D.   On: 06/20/2022 12:22    Procedures Procedure Name: Intubation Date/Time: 06/14/2022 2:45 PM  Performed by: Milton Ferguson, MDPre-anesthesia Checklist: Patient identified Oxygen Delivery Method: Ambu  bag Preoxygenation: Pre-oxygenation with 100% oxygen Induction Type: IV induction Laryngoscope Size: Glidescope Grade View: Grade II Number of attempts: 1 Airway Equipment and Method: Patient positioned with wedge pillow Future Recommendations: Recommend- induction with short-acting agent, and alternative techniques readily available Comments: Patient was intubated with #8 tube.  Patient tolerated the procedure well.  There is no complications      {Document cardiac monitor, telemetry assessment procedure when appropriate:1}  Medications Ordered in ED Medications  potassium chloride 10 mEq in 100 mL IVPB (10 mEq Intravenous New Bag/Given 06/07/2022 1409)  allopurinol (ZYLOPRIM) tablet 300 mg (has no administration in time range)  megestrol (MEGACE) 400 MG/10ML suspension 400 mg (has no administration in time range)  midodrine (PROAMATINE) tablet 5 mg (has no administration in time range)  nicotine (NICODERM CQ - dosed in mg/24 hours) patch 14 mg (has no administration in time range)  loperamide HCl (IMODIUM) 1 MG/7.5ML suspension 4 mg (has no administration in time range)  ondansetron (ZOFRAN) tablet 8 mg (has no administration in time range)  folic acid (FOLVITE) tablet 1 mg (has no administration in time range)  free water 200 mL (has no administration in time range)  gabapentin (NEURONTIN) 250 MG/5ML solution 300 mg (has no administration in time range)  magnesium oxide (MAG-OX) tablet 400 mg (has no administration in time range)  albuterol (PROVENTIL) (2.5 MG/3ML) 0.083% nebulizer solution 3 mL (has no administration in time range)  acetaminophen (TYLENOL) tablet 650 mg (has no administration in time range)    Or  acetaminophen (TYLENOL) suppository 650 mg (has no administration in time range)  ondansetron (ZOFRAN) tablet 4 mg (has no administration in time range)    Or  ondansetron (ZOFRAN) injection 4 mg (has no administration in time range)  potassium chloride (KLOR-CON) packet  60 mEq (has no administration in time range)  famotidine (PEPCID) tablet 20 mg (has no administration in time range)  ferrous sulfate 300 (60 Fe) MG/5ML syrup 300 mg (has no administration in time range)  feeding supplement (JEVITY 1.2 CAL) liquid 1,000 mL (has no administration in time range)  vancomycin (VANCOREADY) IVPB 750 mg/150 mL (has no administration in time range)  ceFEPIme (MAXIPIME)  2 g in sodium chloride 0.9 % 100 mL IVPB (has no administration in time range)  rocuronium bromide 100 MG/10ML SOSY (has no administration in time range)  dextrose 10 % infusion ( Intravenous New Bag/Given 05/28/2022 1437)  etomidate (AMIDATE) injection ( Intravenous Canceled Entry 06/06/2022 1430)  succinylcholine (ANECTINE) injection ( Intravenous Canceled Entry 06/04/2022 1430)  docusate (COLACE) 50 MG/5ML liquid 100 mg (has no administration in time range)  polyethylene glycol (MIRALAX / GLYCOLAX) packet 17 g (has no administration in time range)  fentaNYL (SUBLIMAZE) injection 50 mcg (has no administration in time range)  fentaNYL (SUBLIMAZE) injection 50-200 mcg (has no administration in time range)  propofol (DIPRIVAN) 1000 MG/100ML infusion (has no administration in time range)  dextrose 50 % solution (50 mLs  Given 06/12/2022 1120)  vancomycin (VANCOCIN) IVPB 1000 mg/200 mL premix (0 mg Intravenous Stopped 06/20/2022 1314)  ceFEPIme (MAXIPIME) 2 g in sodium chloride 0.9 % 100 mL IVPB (0 g Intravenous Stopped 06/11/2022 1314)  sodium chloride 0.9 % bolus 2,000 mL (0 mLs Intravenous Stopped 06/17/2022 1314)    ED Course/ Medical Decision Making/ A&P  Patient's respiratory status decompensated while he was in the emergency department so he was intubated.   CRITICAL CARE Performed by: Milton Ferguson Total critical care time: 45 minutes Critical care time was exclusive of separately billable procedures and treating other patients. Critical care was necessary to treat or prevent imminent or life-threatening  deterioration. Critical care was time spent personally by me on the following activities: development of treatment plan with patient and/or surrogate as well as nursing, discussions with consultants, evaluation of patient's response to treatment, examination of patient, obtaining history from patient or surrogate, ordering and performing treatments and interventions, ordering and review of laboratory studies, ordering and review of radiographic studies, pulse oximetry and re-evaluation of patient's condition.  {   Click here for ABCD2, HEART and other calculatorsREFRESH Note before signing :1}                          Medical Decision Making Amount and/or Complexity of Data Reviewed Labs: ordered. Radiology: ordered. ECG/medicine tests: ordered.  Risk Prescription drug management. Decision regarding hospitalization.   Patient with hypothermia and multifocal pneumonia.  He will be admitted to medicine.  {Document critical care time when appropriate:1} {Document review of labs and clinical decision tools ie heart score, Chads2Vasc2 etc:1}  {Document your independent review of radiology images, and any outside records:1} {Document your discussion with family members, caretakers, and with consultants:1} {Document social determinants of health affecting pt's care:1} {Document your decision making why or why not admission, treatments were needed:1} Final Clinical Impression(s) / ED Diagnoses Final diagnoses:  None    Rx / DC Orders ED Discharge Orders     None

## 2022-06-23 NOTE — ED Notes (Signed)
Decreased noise from ETT at this time.

## 2022-06-23 NOTE — ED Notes (Signed)
Brother Gwendel Hanson [id number]  reports patient is muslim and muslims from [facility] will have to come get him and handle the body,  Mosque # [id number]  Brothers address is [place].

## 2022-06-23 NOTE — ED Notes (Signed)
Spoke with brother on the phone who confirmed he will not be coming back up nor will anyone else be visiting.  Confirmed he would like patient to be comfort measures.  Dr Manuella Ghazi notified.

## 2022-06-23 NOTE — ED Notes (Signed)
Patient extubated by RT placed on 2L [place]

## 2022-06-23 NOTE — Progress Notes (Signed)
After initial intubation pt had a strange noise coming from his airway. RT assisted MD with switching out the ETT. A bougie was used to exchange the ETT. After being replaced, pt continued to have the same noise coming from his airway. O2 saturation remained >88, pt was getting full support from the ventilator, and had BBS.

## 2022-06-23 NOTE — ED Notes (Addendum)
Dr. Mayra Neer at bedside. Pt asystole on monitor. TOD called @ 2044.

## 2022-06-23 NOTE — Progress Notes (Addendum)
Pharmacy Antibiotic Note  Trevor [name] is a 52 y.o. male admitted on 06/05/2022 with pneumonia.  Pharmacy has been consulted for cefepime and vancomycin dosing.  Plan: Vancomycin 1000 mg IV loading dose, then '750mg'$   Q 12 hrs. Goal AUC 400-550. Expected AUC: 523 SCr used: 0.5 Cefepime 2g q8 hours F/u cx and clinical progress Monitor V/S, labs and levels as indicated  Height: '5\' 6"'$  (167.6 cm) Weight: 41.7 kg (92 lb) IBW/kg (Calculated) : 63.8  No data recorded.  No results for input(s): "WBC", "CREATININE", "LATICACIDVEN", "VANCOTROUGH", "VANCOPEAK", "VANCORANDOM", "GENTTROUGH", "GENTPEAK", "GENTRANDOM", "TOBRATROUGH", "TOBRAPEAK", "TOBRARND", "AMIKACINPEAK", "AMIKACINTROU", "AMIKACIN" in the last 168 hours.   Estimated Creatinine Clearance: 62.3 mL/min (by C-G formula based on SCr of 0.7 mg/dL).    Allergies  Allergen Reactions   Pork-Derived Products Other (See Comments)    Patient does not eat pork due to his religious beliefs    Antimicrobials this admission: Vancomycin 2/25>>  Cefepime 2/25>  Microbiology results: pending  Thank you for allowing pharmacy to be a part of this patient's care.  Erin Hearing PharmD., BCPS Clinical Pharmacist 06/11/2022 11:30 AM

## 2022-06-23 NOTE — Death Summary Note (Signed)
DEATH SUMMARY   Patient Details  Name: Trevor [name] MRN: [id number] DOB: February 25, 1971 [id number], Trevor Crosby, DO Admission/Discharge Information   Admit Date:  06-20-22  Date of Death: Date of Death: 06-20-2022  Time of Death: Time of Death: 2042-07-05  Length of Stay: 1   Principle Cause of death: Acute on chronic hypoxemic respiratory failure due to severe sepsis secondary to multifocal pneumonia.  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)   Hospital Course: Trevor [name] is a 52 y.o. 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  He was recently discharged on 2/12 after treatment for sepsis related to aspiration/necrotizing pneumonia and was instructed to remain on Augmentin for 21 more days after receiving 7 days of IV Unasyn.  He apparently insists on oral feedings despite the risks of aspiration and has refused tube feeds and became quite hypoglycemic with blood glucose in the 30 range.  He was also noted to be hypothermic likely related to this.   He became significantly more hypoxemic during the course of his  stay and eventually required intubation to help try and maintain his oxygenation status.  Patient did not appear to be a good candidate for intubation, but was adamant that he have this done.  Shortly after intubation, brother arrived at bedside who agreed with DNR status and comfort measures.  He was ultimately terminally extubated and started on comfort care protocol and shortly thereafter expired at 07-05-2042.  Assessment and Plan:  Severe sepsis secondary to multifocal pneumonia -Recent discharge on 2/12 for aspiration pneumonia/necrotizing pneumonia and received prescription for Augmentin for 21 days after 7 days of IV Unasyn -Continue currently on vancomycin and cefepime for HCAP coverage -Unfortunately, patient appears to be noncompliant with medications and has not been taking tube feedings -Follow-up blood cultures obtained in ED   Acute on chronic hypoxemic respiratory failure secondary to above -He wears 2 L nasal cannula oxygen as needed -Concern for recurrent necrotizing pneumonia with aspiration -Recently noted to have COVID-19, currently negative -Failed nonrebreather and BiPAP and continued to remain severely hypoxemic requiring intubation -Transfer to PCCM service   Severe hypoglycemia -Secondary to refusal of tube feedings -Continue to monitor carefully and may require D5 IV fluid if continues to refuse -CBG monitoring every 4 hours   Acute metabolic encephalopathy-multifactorial -Likely secondary to hypoglycemia as well as severe sepsis   Chronic hypotension -Continue midodrine   Severe Hypokalemia -Secondary to poor oral intake -Has not been compliant with Osmolite due to diarrhea and therefore dietitian recommended Jevity 1.2 during last admission -Replete IV and p.o. and monitor in a.m. -Repeat dietitian evaluation   Severe malnutrition related to chronic illness -Patient continues to refuse PEG  tube feedings -Appears to be trending towards failure to thrive with  cancer history noted as well -Appreciate palliative consultation   Stage IVb metastatic squamous cell carcinoma -Followed by Dr. Delton Coombes -Prior palliative care discussion with denial about severity of illness and wants to remain full code   Frequent falls -PT evaluation -Currently at SNF -Fall precautions   Failure to thrive -Poor prognosis long-term -Appreciate dietitian consultation -Appreciate palliative consultation for goals of care   Tobacco abuse -Nicotine patch -Counseled on tobacco cessation   Peripheral neuropathy -Secondary to prior alcohol use -Continue gabapentin    Procedures: Intubation in ED 2/25  Consultations: PCCM, palliative care  The results of significant diagnostics from this hospitalization (including imaging, microbiology, ancillary and laboratory) are listed below for reference.   Significant Diagnostic Studies: DG Chest Portable 1 View  Result Date: 06/16/2022 CLINICAL DATA:  Post intubation in a 52 year old male. EXAM: PORTABLE CHEST 1 VIEW COMPARISON:  June 18, 2022 FINDINGS: LEFT-sided Port-A-Cath in place tip in the low to mid superior vena cava. Endotracheal tube in the low to mid trachea 2 cm from the carina. Cardiomediastinal contours and hilar structures are stable. Continued increasing airspace disease throughout the chest. Subpleural sparing of areas of more confluent consolidation in the LEFT chest and RIGHT upper lobe. Interstitial and airspace opacities elsewhere. Stable RIGHT-sided effusion and basilar airspace disease. No pneumothorax. On limited assessment no acute skeletal findings. In the upper abdomen in the LEFT upper quadrant are bubbly appearing lucencies. Perhaps this reflects changes in the LEFT lower lobe behind the LEFT hemidiaphragm. Bowel loops were present in this area on the previous study. Contents in the stomach or pneumatosis could potentially have this appearance. IMPRESSION: 1. Endotracheal tube in the low to mid  trachea 2 cm from the carina. 2. Continued increasing bilateral airspace process. Findings remain suspicious for marked worsening of multifocal infection though given rapid changes would consider superimposed asymmetric edema or pulmonary hemorrhage. Correlate clinically. 3. Stable RIGHT-sided pleural effusion and basilar airspace disease. 4. Bubbly lucency projecting over of the LEFT hemidiaphragm likely interstitial airspace process in the LEFT lung base and superimposed bowel loops though would suggest dedicated abdominal radiograph to exclude the possibility of pneumatosis in this location. These results will be called to the ordering clinician or representative by the Radiologist Assistant, and communication documented in the PACS or [employer]. Electronically Signed   By: Zetta Bills M.D.   On: 06/14/2022 15:08   DG Chest Port 1 View  Result Date: 05/30/2022 CLINICAL DATA:  52 year old male presents for evaluation of questionable sepsis. EXAM: PORTABLE CHEST 1 VIEW COMPARISON:  May 28, 2022 angio chest from May 28, 2022 as well. FINDINGS: LEFT-sided Port-A-Cath the area of the low screw vena cava. EKG leads project over the chest. Cardiomediastinal contours and hilar structures grossly stable. Marked worsening of nodular parenchymal opacities mixed with interstitial and airspace disease since previous imaging. Likely associated with enlargement of RIGHT-sided pleural effusion. New nodules throughout the LEFT chest and areas of near confluent airspace disease since prior imaging. On limited assessment no acute skeletal process. IMPRESSION: 1. Marked interval worsening of multifocal pneumonia associated with RIGHT-sided pleural effusion. Electronically Signed   By: Zetta Bills M.D.   On: 05/26/2022 12:22   CT Angio Chest PE W and/or Wo Contrast  Result Date: 05/28/2022 CLINICAL DATA:  Shortness of breath.Abdominal pain. History of esophageal cancer. * Tracking Code: BO * EXAM: CT  ANGIOGRAPHY CHEST CT ABDOMEN AND PELVIS WITH CONTRAST TECHNIQUE: Multidetector CT  imaging of the chest was performed using the standard protocol during bolus administration of intravenous contrast. Multiplanar CT image reconstructions and MIPs were obtained to evaluate the vascular anatomy. Multidetector CT imaging of the abdomen and pelvis was performed using the standard protocol during bolus administration of intravenous contrast. RADIATION DOSE REDUCTION: This exam was performed according to the departmental dose-optimization program which includes automated exposure control, adjustment of the mA and/or kV according to patient size and/or use of iterative reconstruction technique. CONTRAST:  184m OMNIPAQUE IOHEXOL 350 MG/ML SOLN COMPARISON:  01/31/2022 FINDINGS: CTA CHEST FINDINGS Cardiovascular: The heart size is normal. No substantial pericardial effusion. No thoracic aortic aneurysm. There is no filling defect in the opacified pulmonary arteries to suggest the presence of an acute pulmonary embolus. Left Port-A-Cath tip is positioned in the distal SVC. Mediastinum/Nodes: Diffusely increased attenuation of mediastinal fat may reflect edema. No mediastinal lymphadenopathy. There is no hilar lymphadenopathy. Proximal esophagus is patulous with food debris in the proximal and mid segment compatible with the known neoplasm and similar to prior. There is no axillary lymphadenopathy. Lungs/Pleura: Circumferential bronchial wall thickening is noted in both lungs, right greater than left. There is extensive tree-in-bud nodularity in the right lung and left lower lobe with areas of consolidative airspace disease in the right upper lobe measuring 2.7 x 1.3 cm. Patchy confluent airspace disease is superimposed on the nodularity in the right middle lobe. There is complete collapse of the right lower lobe with obliteration of the bronchus intermedius. 2.0 x 1.6 cm collection of gas and fluid in the medial right lower lobe  may represent an area of necrosis with fistula formation or abscess. Small right pleural effusion evident. Musculoskeletal: Multiple healed right rib fractures noted. There also multiple healed fractures on the left. Review of the MIP images confirms the above findings. CT ABDOMEN and PELVIS FINDINGS Hepatobiliary: Tiny hypodensity in the medial segment left liver is stable in the interval, likely benign. Gallbladder not well visualized. No intrahepatic or extrahepatic biliary dilation. Pancreas: No focal mass lesion. No dilatation of the main duct. No intraparenchymal cyst. No peripancreatic edema. Spleen: No splenomegaly. No focal mass lesion. Adrenals/Urinary Tract: No adrenal nodule or mass. Tiny hypodensities in both kidneys are too small to characterize, similar to prior likely benign. No hydroureter. Bladder unremarkable. Stomach/Bowel: Stomach is nondistended. Gastrostomy tube evident. Scattered mildly distended fluid-filled small bowel loops in the abdomen and pelvis measuring up to 3.4 cm diameter. Neither the terminal ileum nor the appendix are discretely visible. Gas and fluid is scattered along the length of the mildly distended colon with gas and fluid visible in the rectum. This appearance can be seen in the setting of diarrhea. Vascular/Lymphatic: There is mild atherosclerotic calcification of the abdominal aorta without aneurysm. There is no gastrohepatic or hepatoduodenal ligament lymphadenopathy. No retroperitoneal or mesenteric lymphadenopathy. No pelvic sidewall lymphadenopathy. Assessment for lymphadenopathy is limited by patient cachexia and diffuse edema soft tissues. Reproductive: The prostate gland and seminal vesicles are unremarkable. Other: Probable small volume intraperitoneal free fluid. Diffuse edema of mesentery and extraperitoneal tissues. Musculoskeletal: Bone windows reveal no worrisome lytic or sclerotic osseous lesions. Mild wedge compression deformity is seen at T4 and T7,  similar to prior. Diffuse body wall edema evident. Review of the MIP images confirms the above findings. IMPRESSION: 1. No CT evidence for acute pulmonary embolus. 2. Marked progression of extensive tree-in-bud nodularity in both lungs, right greater than left, with areas of consolidative airspace disease in the right upper lobe and right  middle lobe. Imaging features are compatible with an infectious/inflammatory etiology. Aspiration not excluded. 3. Complete collapse of the right lower lobe with obliteration of the bronchus intermedius. 2.0 x 1.6 cm collection of gas and fluid in the medial right lower lobe is progressive in the interval and may represent an area of necrosis with fistula formation or abscess. 4. Small right pleural effusion. 5. Scattered mildly distended fluid-filled small bowel loops in the abdomen and pelvis measuring up to 3.4 cm diameter. Gas and fluid is scattered along the length of the mildly distended colon with gas and fluid visible in the rectum. This appearance can be seen in the setting of diarrhea. Generally, imaging features suggest component of underlying dysmotility/ileus 6. Marked cachexia with diffuse edema of the mesentery, extraperitoneal fat, and body wall. 7. Patulous proximal and mid esophagus with debris in the lumen, compatible with the patient's known esophageal neoplasm. Electronically Signed   By: Misty Stanley M.D.   On: 05/28/2022 12:49   CT ABDOMEN PELVIS W CONTRAST  Result Date: 05/28/2022 CLINICAL DATA:  Shortness of breath.Abdominal pain. History of esophageal cancer. * Tracking Code: BO * EXAM: CT ANGIOGRAPHY CHEST CT ABDOMEN AND PELVIS WITH CONTRAST TECHNIQUE: Multidetector CT imaging of the chest was performed using the standard protocol during bolus administration of intravenous contrast. Multiplanar CT image reconstructions and MIPs were obtained to evaluate the vascular anatomy. Multidetector CT imaging of the abdomen and pelvis was performed using the  standard protocol during bolus administration of intravenous contrast. RADIATION DOSE REDUCTION: This exam was performed according to the departmental dose-optimization program which includes automated exposure control, adjustment of the mA and/or kV according to patient size and/or use of iterative reconstruction technique. CONTRAST:  157m OMNIPAQUE IOHEXOL 350 MG/ML SOLN COMPARISON:  01/31/2022 FINDINGS: CTA CHEST FINDINGS Cardiovascular: The heart size is normal. No substantial pericardial effusion. No thoracic aortic aneurysm. There is no filling defect in the opacified pulmonary arteries to suggest the presence of an acute pulmonary embolus. Left Port-A-Cath tip is positioned in the distal SVC. Mediastinum/Nodes: Diffusely increased attenuation of mediastinal fat may reflect edema. No mediastinal lymphadenopathy. There is no hilar lymphadenopathy. Proximal esophagus is patulous with food debris in the proximal and mid segment compatible with the known neoplasm and similar to prior. There is no axillary lymphadenopathy. Lungs/Pleura: Circumferential bronchial wall thickening is noted in both lungs, right greater than left. There is extensive tree-in-bud nodularity in the right lung and left lower lobe with areas of consolidative airspace disease in the right upper lobe measuring 2.7 x 1.3 cm. Patchy confluent airspace disease is superimposed on the nodularity in the right middle lobe. There is complete collapse of the right lower lobe with obliteration of the bronchus intermedius. 2.0 x 1.6 cm collection of gas and fluid in the medial right lower lobe may represent an area of necrosis with fistula formation or abscess. Small right pleural effusion evident. Musculoskeletal: Multiple healed right rib fractures noted. There also multiple healed fractures on the left. Review of the MIP images confirms the above findings. CT ABDOMEN and PELVIS FINDINGS Hepatobiliary: Tiny hypodensity in the medial segment left liver  is stable in the interval, likely benign. Gallbladder not well visualized. No intrahepatic or extrahepatic biliary dilation. Pancreas: No focal mass lesion. No dilatation of the main duct. No intraparenchymal cyst. No peripancreatic edema. Spleen: No splenomegaly. No focal mass lesion. Adrenals/Urinary Tract: No adrenal nodule or mass. Tiny hypodensities in both kidneys are too small to characterize, similar to prior likely benign. No  hydroureter. Bladder unremarkable. Stomach/Bowel: Stomach is nondistended. Gastrostomy tube evident. Scattered mildly distended fluid-filled small bowel loops in the abdomen and pelvis measuring up to 3.4 cm diameter. Neither the terminal ileum nor the appendix are discretely visible. Gas and fluid is scattered along the length of the mildly distended colon with gas and fluid visible in the rectum. This appearance can be seen in the setting of diarrhea. Vascular/Lymphatic: There is mild atherosclerotic calcification of the abdominal aorta without aneurysm. There is no gastrohepatic or hepatoduodenal ligament lymphadenopathy. No retroperitoneal or mesenteric lymphadenopathy. No pelvic sidewall lymphadenopathy. Assessment for lymphadenopathy is limited by patient cachexia and diffuse edema soft tissues. Reproductive: The prostate gland and seminal vesicles are unremarkable. Other: Probable small volume intraperitoneal free fluid. Diffuse edema of mesentery and extraperitoneal tissues. Musculoskeletal: Bone windows reveal no worrisome lytic or sclerotic osseous lesions. Mild wedge compression deformity is seen at T4 and T7, similar to prior. Diffuse body wall edema evident. Review of the MIP images confirms the above findings. IMPRESSION: 1. No CT evidence for acute pulmonary embolus. 2. Marked progression of extensive tree-in-bud nodularity in both lungs, right greater than left, with areas of consolidative airspace disease in the right upper lobe and right middle lobe. Imaging features  are compatible with an infectious/inflammatory etiology. Aspiration not excluded. 3. Complete collapse of the right lower lobe with obliteration of the bronchus intermedius. 2.0 x 1.6 cm collection of gas and fluid in the medial right lower lobe is progressive in the interval and may represent an area of necrosis with fistula formation or abscess. 4. Small right pleural effusion. 5. Scattered mildly distended fluid-filled small bowel loops in the abdomen and pelvis measuring up to 3.4 cm diameter. Gas and fluid is scattered along the length of the mildly distended colon with gas and fluid visible in the rectum. This appearance can be seen in the setting of diarrhea. Generally, imaging features suggest component of underlying dysmotility/ileus 6. Marked cachexia with diffuse edema of the mesentery, extraperitoneal fat, and body wall. 7. Patulous proximal and mid esophagus with debris in the lumen, compatible with the patient's known esophageal neoplasm. Electronically Signed   By: Misty Stanley M.D.   On: 05/28/2022 12:49   DG Chest Portable 1 View  Result Date: 05/28/2022 CLINICAL DATA:  Shortness of breath. EXAM: PORTABLE CHEST 1 VIEW COMPARISON:  05/22/2022 FINDINGS: Lungs are hyperexpanded. Left lung clear. Increasing consolidative opacity at the right base with patchy airspace disease in the right mid and upper lung similar to prior. The cardio pericardial silhouette is enlarged. Left Port-A-Cath tip overlies the distal SVC. Telemetry leads overlie the chest. IMPRESSION: Increasing consolidative opacity at the right base with patchy airspace disease in the right mid and upper lung. Electronically Signed   By: Misty Stanley M.D.   On: 05/28/2022 09:05   DG Chest 1 View  Result Date: 05/22/2022 CLINICAL DATA:  Cough. EXAM: CHEST  1 VIEW COMPARISON:  March 02, 2022 FINDINGS: The heart size and mediastinal contours are stable. Right central venous line is unchanged. Patchy consolidation identified  throughout the right lung. The left lung is clear. The visualized skeletal structures are stable. IMPRESSION: Pneumonia throughout the right lung. Electronically Signed   By: Abelardo Diesel M.D.   On: 05/22/2022 16:53    Microbiology: Recent Results (from the past 240 hour(s))  Resp panel by RT-PCR (RSV, Flu A&B, Covid) Anterior Nasal Swab     Status: None   Collection Time: 06/14/2022 11:39 AM   Specimen: Anterior Nasal  Swab  Result Value Ref Range Status   SARS Coronavirus 2 by RT PCR NEGATIVE NEGATIVE Final    Comment: (NOTE) SARS-CoV-2 target nucleic acids are NOT DETECTED.  The SARS-CoV-2 RNA is generally detectable in upper respiratory specimens during the acute phase of infection. The lowest concentration of SARS-CoV-2 viral copies this assay can detect is 138 copies/mL. A negative result does not preclude SARS-Cov-2 infection and should not be used as the sole basis for treatment or other patient management decisions. A negative result may occur with  improper specimen collection/handling, submission of specimen other than nasopharyngeal swab, presence of viral mutation(s) within the areas targeted by this assay, and inadequate number of viral copies(<138 copies/mL). A negative result must be combined with clinical observations, patient history, and epidemiological information. The expected result is Negative.  Fact Sheet for Patients:  EntrepreneurPulse.com.au  Fact Sheet for Healthcare Providers:  IncredibleEmployment.be  This test is no t yet approved or cleared by the [place] FDA and  has been authorized for detection and/or diagnosis of SARS-CoV-2 by FDA under an Emergency Use Authorization (EUA). This EUA will remain  in effect (meaning this test can be used) for the duration of the COVID-19 declaration under Section 564(b)(1) of the Act, 21 U.S.C.section 360bbb-3(b)(1), unless the authorization is terminated  or revoked sooner.        Influenza A by PCR NEGATIVE NEGATIVE Final   Influenza B by PCR NEGATIVE NEGATIVE Final    Comment: (NOTE) The Xpert Xpress SARS-CoV-2/FLU/RSV plus assay is intended as an aid in the diagnosis of influenza from Nasopharyngeal swab specimens and should not be used as a sole basis for treatment. Nasal washings and aspirates are unacceptable for Xpert Xpress SARS-CoV-2/FLU/RSV testing.  Fact Sheet for Patients: EntrepreneurPulse.com.au  Fact Sheet for Healthcare Providers: IncredibleEmployment.be  This test is not yet approved or cleared by the [place] FDA and has been authorized for detection and/or diagnosis of SARS-CoV-2 by FDA under an Emergency Use Authorization (EUA). This EUA will remain in effect (meaning this test can be used) for the duration of the COVID-19 declaration under Section 564(b)(1) of the Act, 21 U.S.C. section 360bbb-3(b)(1), unless the authorization is terminated or revoked.     Resp Syncytial Virus by PCR NEGATIVE NEGATIVE Final    Comment: (NOTE) Fact Sheet for Patients: EntrepreneurPulse.com.au  Fact Sheet for Healthcare Providers: IncredibleEmployment.be  This test is not yet approved or cleared by the [place] FDA and has been authorized for detection and/or diagnosis of SARS-CoV-2 by FDA under an Emergency Use Authorization (EUA). This EUA will remain in effect (meaning this test can be used) for the duration of the COVID-19 declaration under Section 564(b)(1) of the Act, 21 U.S.C. section 360bbb-3(b)(1), unless the authorization is terminated or revoked.  Performed at [facility], [address], [place], [place] [address]   Blood Culture (routine x 2)     Status: None (Preliminary result)   Collection Time: 05/26/2022 12:02 PM   Specimen: BLOOD  Result Value Ref Range Status   Specimen Description BLOOD BLOOD LEFT ARM  Final   Special Requests Blood  Culture adequate volume  Final   Culture   Final    NO GROWTH < 12 HOURS Performed at [facility], [address], [place], [place] [address]    Report Status PENDING  Incomplete  Blood Culture (routine x 2)     Status: None (Preliminary result)   Collection Time: 06/17/2022 12:02 PM   Specimen: BLOOD  Result Value  Ref Range Status   Specimen Description BLOOD BLOOD RIGHT ARM  Final   Special Requests Blood Culture adequate volume  Final   Culture   Final    NO GROWTH < 12 HOURS Performed at [place] [facility], [address], [place], [place] [address]    Report Status PENDING  Incomplete    Time spent: 35 minutes  Signed: Rodena Goldmann, DO 06/01/2022

## 2022-06-23 NOTE — ED Notes (Signed)
RT at bedside to extubate patient.

## 2022-06-23 NOTE — Procedures (Signed)
Extubation Procedure Note  Patient Details:   Name: Geffery Sculthorpe DOB: Jul 15, 1970 MRN: [id number]   Airway Documentation:    Vent end date: 06/17/2022 Vent end time: 1816   Evaluation  O2 sats: stable throughout Complications: No apparent complications Patient did tolerate procedure well. Bilateral Breath Sounds: Rhonchi, Coarse crackles   No  Pt was extubated to a 2L [place] per comfort care order. RN at bedside with RT during extubation.  Tiburcio Bash 06/07/2022, 6:44 PM

## 2022-06-23 NOTE — ED Notes (Signed)
Brought from [facility] after having crackles x 4 days and nurse gave a breathing treatment.  Reports he was talking prior to that.  Patient has esophageal cancer and has been refusing tube feeding.

## 2022-06-23 NOTE — ED Notes (Signed)
ED TO INPATIENT HANDOFF REPORT  ED Nurse Name and Phone #: [phone]  S Name/Age/Gender Trevor [name] 52 y.o. male Room/Bed: APA01/APA01  Code Status   Code Status: Prior  Home/SNF/Other Skilled nursing facility Patient oriented to: self, place, and situation Is this baseline? Yes   Triage Complete: Triage complete  Chief Complaint Severe sepsis ([place]) [A41.9, R65.20]  Triage Note No notes on file   Allergies Allergies  Allergen Reactions   Pork-Derived Products Other (See Comments)    Patient does not eat pork due to his religious beliefs    Level of Care/Admitting Diagnosis ED Disposition     ED Disposition  [place]: [facility] [id number]  Level of Care: Stepdown [14]  Covid Evaluation: Asymptomatic - no recent exposure (last 10 days) testing not required  [facility]: [id number]  Admitting Physician: Rodena Goldmann [id number]  Attending Physician: Heath Lark D [id number]  Certification:: I certify this patient will need inpatient services for at least 2 midnights  Estimated Length of Stay: 4          B Medical/Surgery History Past Medical History:  Diagnosis Date   Diabetes mellitus without complication ([place])    Esophageal cancer ([place])    Hypertension    Past Surgical History:  Procedure Laterality Date   BIOPSY  08/26/2021   Procedure: BIOPSY;  Surgeon: Eloise Harman, DO;  [facility];  Service: Endoscopy;;   BRONCHIAL BIOPSY  01/23/2022   Procedure: BRONCHIAL BIOPSIES;  Surgeon: Collene Gobble, MD;  Location: [facility];  Service: Pulmonary;;   BRONCHIAL BRUSHINGS  01/23/2022   Procedure: BRONCHIAL BRUSHINGS;  Surgeon: Collene Gobble, MD;  Location: [facility];  Service: Pulmonary;;   BRONCHIAL NEEDLE ASPIRATION BIOPSY  01/23/2022   Procedure: BRONCHIAL NEEDLE ASPIRATION BIOPSIES;  Surgeon: Collene Gobble, MD;  Location: [facility] ENDOSCOPY;  Service: Pulmonary;;    BRONCHIAL WASHINGS  01/23/2022   Procedure: BRONCHIAL WASHINGS;  Surgeon: Collene Gobble, MD;  Location: [facility] ENDOSCOPY;  Service: Pulmonary;;   ESOPHAGOGASTRODUODENOSCOPY (EGD) WITH PROPOFOL N/A 08/26/2021   Procedure: ESOPHAGOGASTRODUODENOSCOPY (EGD) WITH PROPOFOL;  Surgeon: Eloise Harman, DO;  [facility];  Service: Endoscopy;  Laterality: N/A;  2:00pm   GASTROSTOMY N/A 10/12/2021   Procedure: OPEN GASTROSTOMY TUBE;  Surgeon: Aviva Signs, MD;  Location: [facility];  Service: General;  Laterality: N/A;   IR [place] GASTRO/COLONIC TUBE PERCUT W/FLUORO  03/10/2022   PORTACATH PLACEMENT Left 10/12/2021   Procedure: INSERTION PORT-A-CATH;  Surgeon: Aviva Signs, MD;  Location: [facility];  Service: General;  Laterality: Left;   VIDEO BRONCHOSCOPY WITH RADIAL ENDOBRONCHIAL ULTRASOUND  01/23/2022   Procedure: VIDEO BRONCHOSCOPY WITH RADIAL ENDOBRONCHIAL ULTRASOUND;  Surgeon: Collene Gobble, MD;  Location: [facility] ENDOSCOPY;  Service: Pulmonary;;     A IV Location/Drains/Wounds Patient Lines/Drains/Airways Status     Active Line/Drains/Airways     Name Placement date Placement time Site Days   Implanted Port 06/04/22 Left Chest 06/04/22  1359  Chest  14   Peripheral IV 06/09/2022 22 G Left Antecubital 06/04/2022  1144  Antecubital  less than 1   Gastrostomy/Enterostomy Gastrostomy 20 Fr. LUQ 03/10/22  0948  LUQ  100   External Urinary Catheter 06/20/2022  1146  --  less than 1   Pressure Injury 05/28/22 Sacrum Posterior Stage 1 -  Intact skin with non-blanchable redness of a localized area usually over a bony prominence. 05/28/22  1454  --  21            Intake/Output Last 24 hours  Intake/Output Summary (Last 24 hours) at 05/28/2022 1329 Last data filed at 06/01/2022 1314 Gross per 24 hour  Intake 2300 ml  Output --  Net 2300 ml    Labs/Imaging Results for orders placed or performed during the hospital encounter of 06/05/2022 (from the past 48 hour(s))  CBG monitoring, ED     Status:  Abnormal   Collection Time: 06/02/2022 11:15 AM  Result Value Ref Range   Glucose-Capillary 31 (LL) 70 - 99 mg/dL    Comment: Glucose reference range applies only to samples taken after fasting for at least 8 hours.   Comment 1 Notify RN   Comprehensive metabolic panel     Status: Abnormal   Collection Time: 06/15/2022 11:22 AM  Result Value Ref Range   Sodium 130 (L) 135 - 145 mmol/L   Potassium 3.0 (L) 3.5 - 5.1 mmol/L   Chloride 105 98 - 111 mmol/L   CO2 21 (L) 22 - 32 mmol/L   Glucose, Bld 427 (H) 70 - 99 mg/dL    Comment: Glucose reference range applies only to samples taken after fasting for at least 8 hours.   BUN 19 6 - 20 mg/dL   Creatinine, Ser 0.60 (L) 0.61 - 1.24 mg/dL   Calcium 7.5 (L) 8.9 - 10.3 mg/dL   Total Protein 4.5 (L) 6.5 - 8.1 g/dL   Albumin <1.5 (L) 3.5 - 5.0 g/dL   AST 31 15 - 41 U/L   ALT 28 0 - 44 U/L   Alkaline Phosphatase 170 (H) 38 - 126 U/L   Total Bilirubin 0.7 0.3 - 1.2 mg/dL   GFR, Estimated >60 >60 mL/min    Comment: (NOTE) Calculated using the CKD-EPI Creatinine Equation (2021)    Anion gap 4 (L) 5 - 15    Comment: Performed at [facility], [address] [place], [place] [address]  CBC with Differential     Status: Abnormal   Collection Time: 06/08/2022 11:22 AM  Result Value Ref Range   WBC 8.3 4.0 - 10.5 K/uL   RBC 3.75 (L) 4.22 - 5.81 MIL/uL   Hemoglobin 9.3 (L) 13.0 - 17.0 g/dL   HCT 28.2 (L) 39.0 - 52.0 %   MCV 75.2 (L) 80.0 - 100.0 fL   MCH 24.8 (L) 26.0 - 34.0 pg   MCHC 33.0 30.0 - 36.0 g/dL   RDW 18.3 (H) 11.5 - 15.5 %   Platelets 182 150 - 400 K/uL   nRBC 0.0 0.0 - 0.2 %   Neutrophils Relative % 59 %   Neutro Abs 6.9 1.7 - 7.7 K/uL   Band Neutrophils 24 %   Lymphocytes Relative 10 %   Lymphs Abs 0.8 0.7 - 4.0 K/uL   Monocytes Relative 1 %   Monocytes Absolute 0.1 0.1 - 1.0 K/uL   Eosinophils Relative 0 %   Eosinophils Absolute 0.0 0.0 - 0.5 K/uL   Basophils Relative 0 %   Basophils Absolute 0.0 0.0 - 0.1 K/uL   WBC  Morphology INCREASED BANDS (>20% BANDS)     Comment: MILD LEFT SHIFT (1-5% METAS, OCC MYELO, OCC BANDS) TOXIC GRANULATION    RBC Morphology MORPHOLOGY UNREMARKABLE    Smear Review PLATELET COUNT CONFIRMED BY SMEAR    Metamyelocytes Relative 6 %   Abs Immature Granulocytes 0.50 (H) 0.00 - 0.07 K/uL    Comment: Performed at [facility], [address], [place], [place]  [address]  Protime-INR     Status: Abnormal   Collection Time: 06/09/2022 11:22 AM  Result Value Ref Range   Prothrombin Time 19.8 (H) 11.4 - 15.2 seconds   INR 1.7 (H) 0.8 - 1.2    Comment: (NOTE) INR goal varies based on device and disease states. Performed at [facility], [address], [place], [place] [address]   APTT     Status: None   Collection Time: 06/21/2022 11:22 AM  Result Value Ref Range   aPTT 32 24 - 36 seconds    Comment: Performed at [facility], [address], [place], [place] [address]  Resp panel by RT-PCR (RSV, Flu A&B, Covid) Anterior Nasal Swab     Status: None   Collection Time: 05/28/2022 11:39 AM   Specimen: Anterior Nasal Swab  Result Value Ref Range   SARS Coronavirus 2 by RT PCR NEGATIVE NEGATIVE    Comment: (NOTE) SARS-CoV-2 target nucleic acids are NOT DETECTED.  The SARS-CoV-2 RNA is generally detectable in upper respiratory specimens during the acute phase of infection. The lowest concentration of SARS-CoV-2 viral copies this assay can detect is 138 copies/mL. A negative result does not preclude SARS-Cov-2 infection and should not be used as the sole basis for treatment or other patient management decisions. A negative result may occur with  improper specimen collection/handling, submission of specimen other than nasopharyngeal swab, presence of viral mutation(s) within the areas targeted by this assay, and inadequate number of viral copies(<138 copies/mL). A negative result must be combined with clinical observations, patient history, and epidemiological information. The expected  result is Negative.  Fact Sheet for Patients:  EntrepreneurPulse.com.au  Fact Sheet for Healthcare Providers:  IncredibleEmployment.be  This test is no t yet approved or cleared by the [place] FDA and  has been authorized for detection and/or diagnosis of SARS-CoV-2 by FDA under an Emergency Use Authorization (EUA). This EUA will remain  in effect (meaning this test can be used) for the duration of the COVID-19 declaration under Section 564(b)(1) of the Act, 21 U.S.C.section 360bbb-3(b)(1), unless the authorization is terminated  or revoked sooner.       Influenza A by PCR NEGATIVE NEGATIVE   Influenza B by PCR NEGATIVE NEGATIVE    Comment: (NOTE) The Xpert Xpress SARS-CoV-2/FLU/RSV plus assay is intended as an aid in the diagnosis of influenza from Nasopharyngeal swab specimens and should not be used as a sole basis for treatment. Nasal washings and aspirates are unacceptable for Xpert Xpress SARS-CoV-2/FLU/RSV testing.  Fact Sheet for Patients: EntrepreneurPulse.com.au  Fact Sheet for Healthcare Providers: IncredibleEmployment.be  This test is not yet approved or cleared by the [place] FDA and has been authorized for detection and/or diagnosis of SARS-CoV-2 by FDA under an Emergency Use Authorization (EUA). This EUA will remain in effect (meaning this test can be used) for the duration of the COVID-19 declaration under Section 564(b)(1) of the Act, 21 U.S.C. section 360bbb-3(b)(1), unless the authorization is terminated or revoked.     Resp Syncytial Virus by PCR NEGATIVE NEGATIVE    Comment: (NOTE) Fact Sheet for Patients: EntrepreneurPulse.com.au  Fact Sheet for Healthcare Providers: IncredibleEmployment.be  This test is not yet approved or cleared by the [place] FDA and has been authorized for detection and/or diagnosis of SARS-CoV-2  by FDA under an Emergency Use Authorization (EUA). This EUA will remain in effect (meaning this test can be used) for the duration of the COVID-19 declaration under Section 564(b)(1) of the Act, 21 U.S.C. section  360bbb-3(b)(1), unless the authorization is terminated or revoked.  Performed at [facility], [address], [place], [place] [address]   CBG monitoring, ED     Status: None   Collection Time: 05/26/2022 11:42 AM  Result Value Ref Range   Glucose-Capillary 93 70 - 99 mg/dL    Comment: Glucose reference range applies only to samples taken after fasting for at least 8 hours.  CBG monitoring, ED     Status: None   Collection Time: 06/10/2022 11:50 AM  Result Value Ref Range   Glucose-Capillary 95 70 - 99 mg/dL    Comment: Glucose reference range applies only to samples taken after fasting for at least 8 hours.  Lactic acid, plasma     Status: Abnormal   Collection Time: 06/15/2022 12:02 PM  Result Value Ref Range   Lactic Acid, Venous 2.6 (HH) 0.5 - 1.9 mmol/L    Comment: CRITICAL RESULT CALLED TO, READ BACK BY AND VERIFIED WITH FLETCHER,A AT 1241 ON 2.25.24 BY ISLEY,B REPEATED TO VERIFY Performed at [facility], [address], [place], [place] [address]   Blood Culture (routine x 2)     Status: None (Preliminary result)   Collection Time: 06/05/2022 12:02 PM   Specimen: BLOOD  Result Value Ref Range   Specimen Description BLOOD BLOOD LEFT ARM    Special Requests      Blood Culture adequate volume Performed at [facility], [address] [place], [place] [address]    Culture PENDING    Report Status PENDING   Blood Culture (routine x 2)     Status: None (Preliminary result)   Collection Time: 06/02/2022 12:02 PM   Specimen: BLOOD  Result Value Ref Range   Specimen Description BLOOD BLOOD RIGHT ARM    Special Requests      Blood Culture adequate volume Performed at [facility], [address], [place], [place] [address]    Culture PENDING    Report Status PENDING    Blood gas, arterial     Status: Abnormal   Collection Time: 06/12/2022  1:10 PM  Result Value Ref Range   pH, Arterial 7.28 (L) 7.35 - 7.45   pCO2 arterial 45 32 - 48 mmHg   pO2, Arterial 48 (L) 83 - 108 mmHg   Bicarbonate 21.5 20.0 - 28.0 mmol/L   Acid-base deficit 6.0 (H) 0.0 - 2.0 mmol/L   O2 Saturation 90.4 %   Patient temperature 34.8    Collection site RIGHT RADIAL    Drawn by [id number]    Allens test (pass/fail) PASS PASS    Comment: Performed at [facility], [address], [place], [place] [address]   DG Chest [place] 1 View  Result Date: 05/31/2022 CLINICAL DATA:  52 year old male presents for evaluation of questionable sepsis. EXAM: PORTABLE CHEST 1 VIEW COMPARISON:  May 28, 2022 angio chest from May 28, 2022 as well. FINDINGS: LEFT-sided Port-A-Cath the area of the low screw vena cava. EKG leads project over the chest. Cardiomediastinal contours and hilar structures grossly stable. Marked worsening of nodular parenchymal opacities mixed with interstitial and airspace disease since previous imaging. Likely associated with enlargement of RIGHT-sided pleural effusion. New nodules throughout the LEFT chest and areas of near confluent airspace disease since prior imaging. On limited assessment no acute skeletal process. IMPRESSION: 1. Marked interval worsening of multifocal pneumonia associated with RIGHT-sided pleural effusion. Electronically Signed   By: Zetta Bills M.D.   On: 06/17/2022 12:22    Pending Labs [employer] (From  admission, onward)     Start     Ordered   05/28/2022 [id number]  Basic metabolic panel  Once,   STAT        05/26/2022 1216   05/25/2022 1122  Lactic acid, plasma  (Septic presentation on arrival (screening labs, nursing and treatment orders for obvious sepsis))  STAT Now then every 2 hours,   R (with STAT occurrences)      06/14/2022 1122   06/17/2022 1122  Urinalysis, w/ Reflex to Culture (Infection Suspected) -Urine, Clean Catch  (Septic presentation on arrival  (screening labs, nursing and treatment orders for obvious sepsis))  Once,   URGENT       Question:  Specimen Source  Answer:  Urine, Clean Catch   05/30/2022 1122            Vitals/Pain Today's Vitals   05/31/2022 1153 06/08/2022 1200 06/17/2022 1300 06/04/2022 1320  BP:  (!) 132/94 (!) 128/102   Pulse:  (!) 106 (!) 128 (!) 134  Resp:  15 (!) 26 (!) 28  Temp:      TempSrc:      SpO2:  91% 93% (!) 88%  Weight:      Height:      PainSc: 0-No pain       Isolation Precautions No active isolations  Medications Medications  potassium chloride 10 mEq in 100 mL IVPB (10 mEq Intravenous New Bag/Given 06/01/2022 1314)  dextrose 50 % solution (50 mLs  Given 06/01/2022 1120)  vancomycin (VANCOCIN) IVPB 1000 mg/200 mL premix (0 mg Intravenous Stopped 06/13/2022 1314)  ceFEPIme (MAXIPIME) 2 g in sodium chloride 0.9 % 100 mL IVPB (0 g Intravenous Stopped 06/13/2022 1314)  sodium chloride 0.9 % bolus 2,000 mL (0 mLs Intravenous Stopped 05/30/2022 1314)    Mobility non-ambulatory     Focused Assessments Pulmonary Assessment Handoff:  Lung sounds: L Breath Sounds: Diminished, Rhonchi R Breath Sounds: Diminished, Rhonchi O2 Device: Nasal Cannula O2 Flow Rate (L/min): 6 L/min and NRB    R Recommendations: See Admitting Provider Note  Report given to:   Additional Notes: [id number]

## 2022-06-23 DEATH — deceased

## 2022-07-02 LAB — ANTIFUNGAL AST 9 DRUG PANEL
Amphotericin B MIC: 0.5
Fluconazole Islt MIC: 16
Flucytosine MIC: 0.06
Itraconazole MIC: 1
Posaconazole MIC: 2
Source: 183119
Voriconazole MIC: 0.5

## 2022-07-07 LAB — CULTURE, BLOOD (ROUTINE X 2): Special Requests: ADEQUATE

## 2022-07-24 DIAGNOSIS — Z419 Encounter for procedure for purposes other than remedying health state, unspecified: Secondary | ICD-10-CM | POA: Diagnosis not present

## 2023-06-06 IMAGING — DX DG FOOT COMPLETE 3+V*R*
3 series · 3 of 3 positions shown · non-contrast
Comparison: None.

CLINICAL DATA: Swelling and pain.  Status post injury.

EXAM:
RIGHT FOOT COMPLETE - 3+ VIEW

[foot ap]
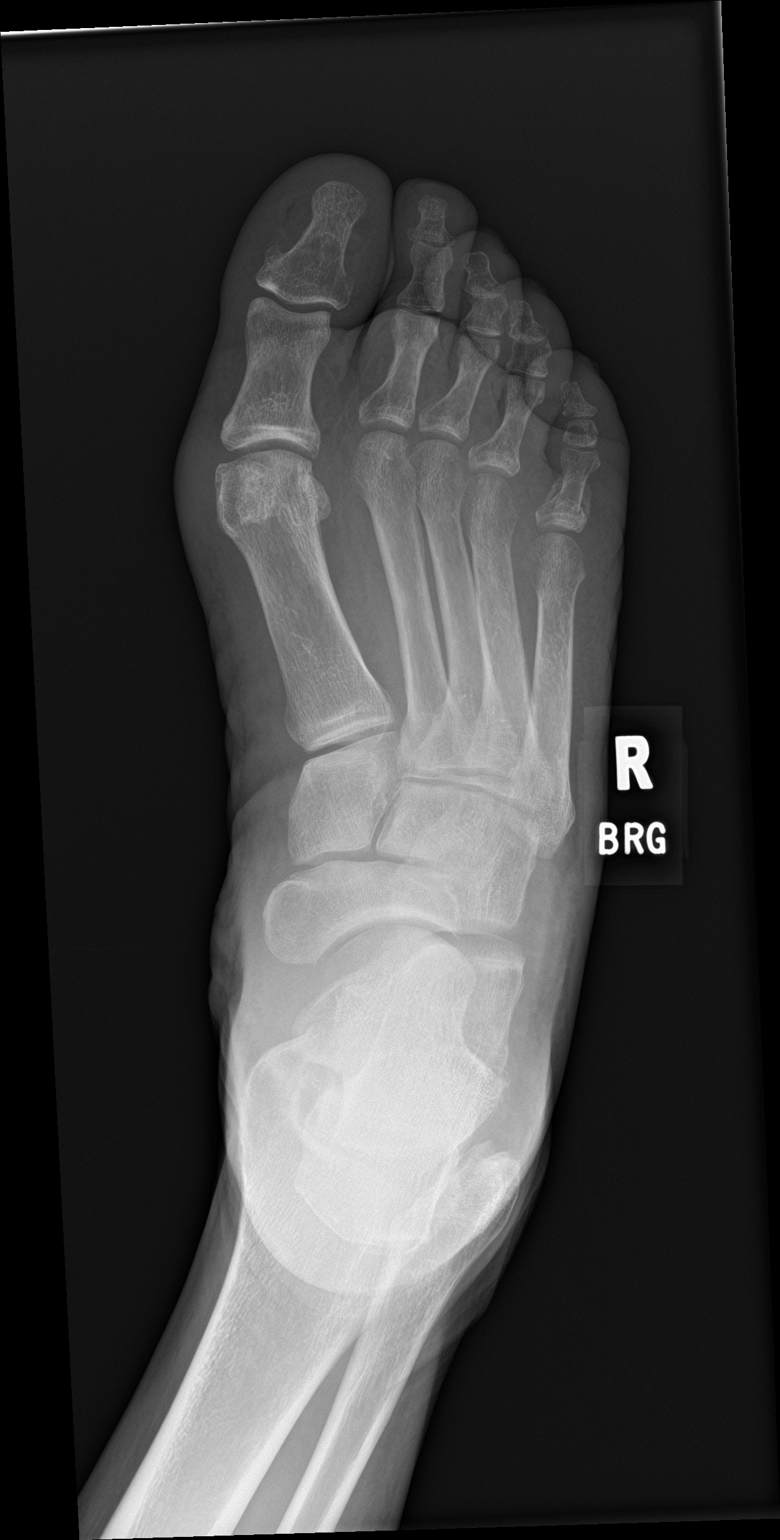

[foot obl]
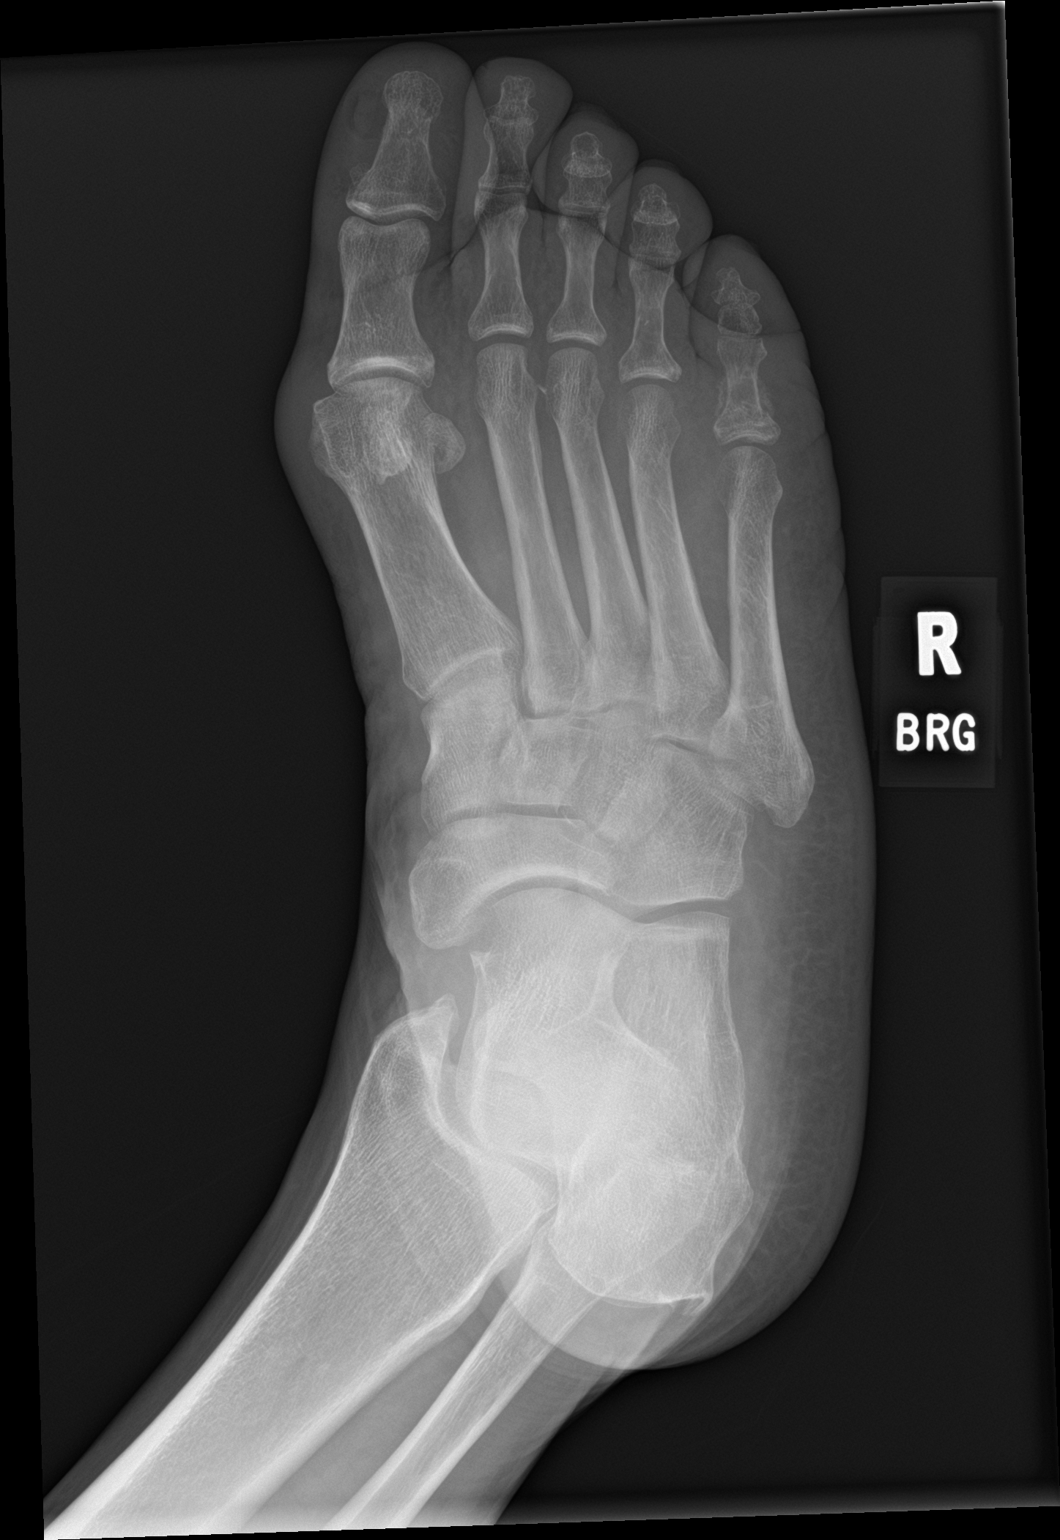

[foot lat]
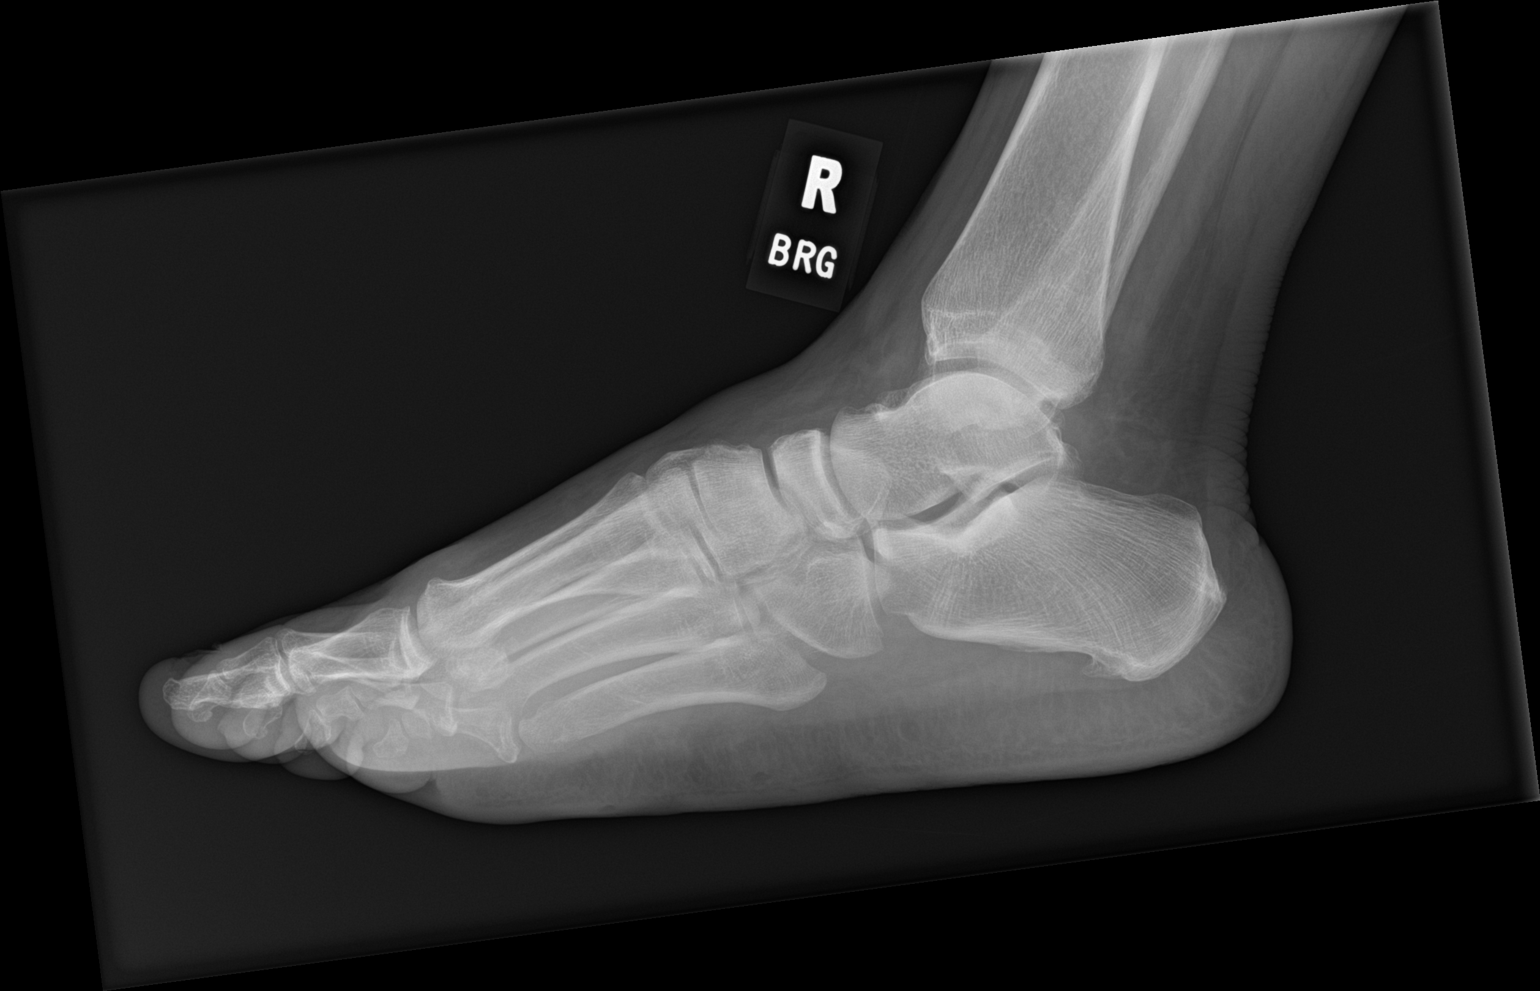

[3 of 3 positions shown; findings below may reference images not displayed]

FINDINGS: Subacute fracture deformity is noted involving the base of the fifth
proximal phalanx. The fracture line is indistinct but there is
surrounding callus formation. No additional fracture or dislocation
identified. No radiopaque foreign body or soft tissue
calcifications.
IMPRESSION: Subacute fracture deformity involves the base of the fifth proximal
phalanx.

## 2023-11-18 IMAGING — DX DG CHEST 1V PORT
1 series · 1 of 1 positions shown · non-contrast
Comparison: None.

CLINICAL DATA: Weakness, bilateral foot swelling without chest pain
or shortness of breath.

EXAM:
PORTABLE CHEST 1 VIEW

[chest ap]
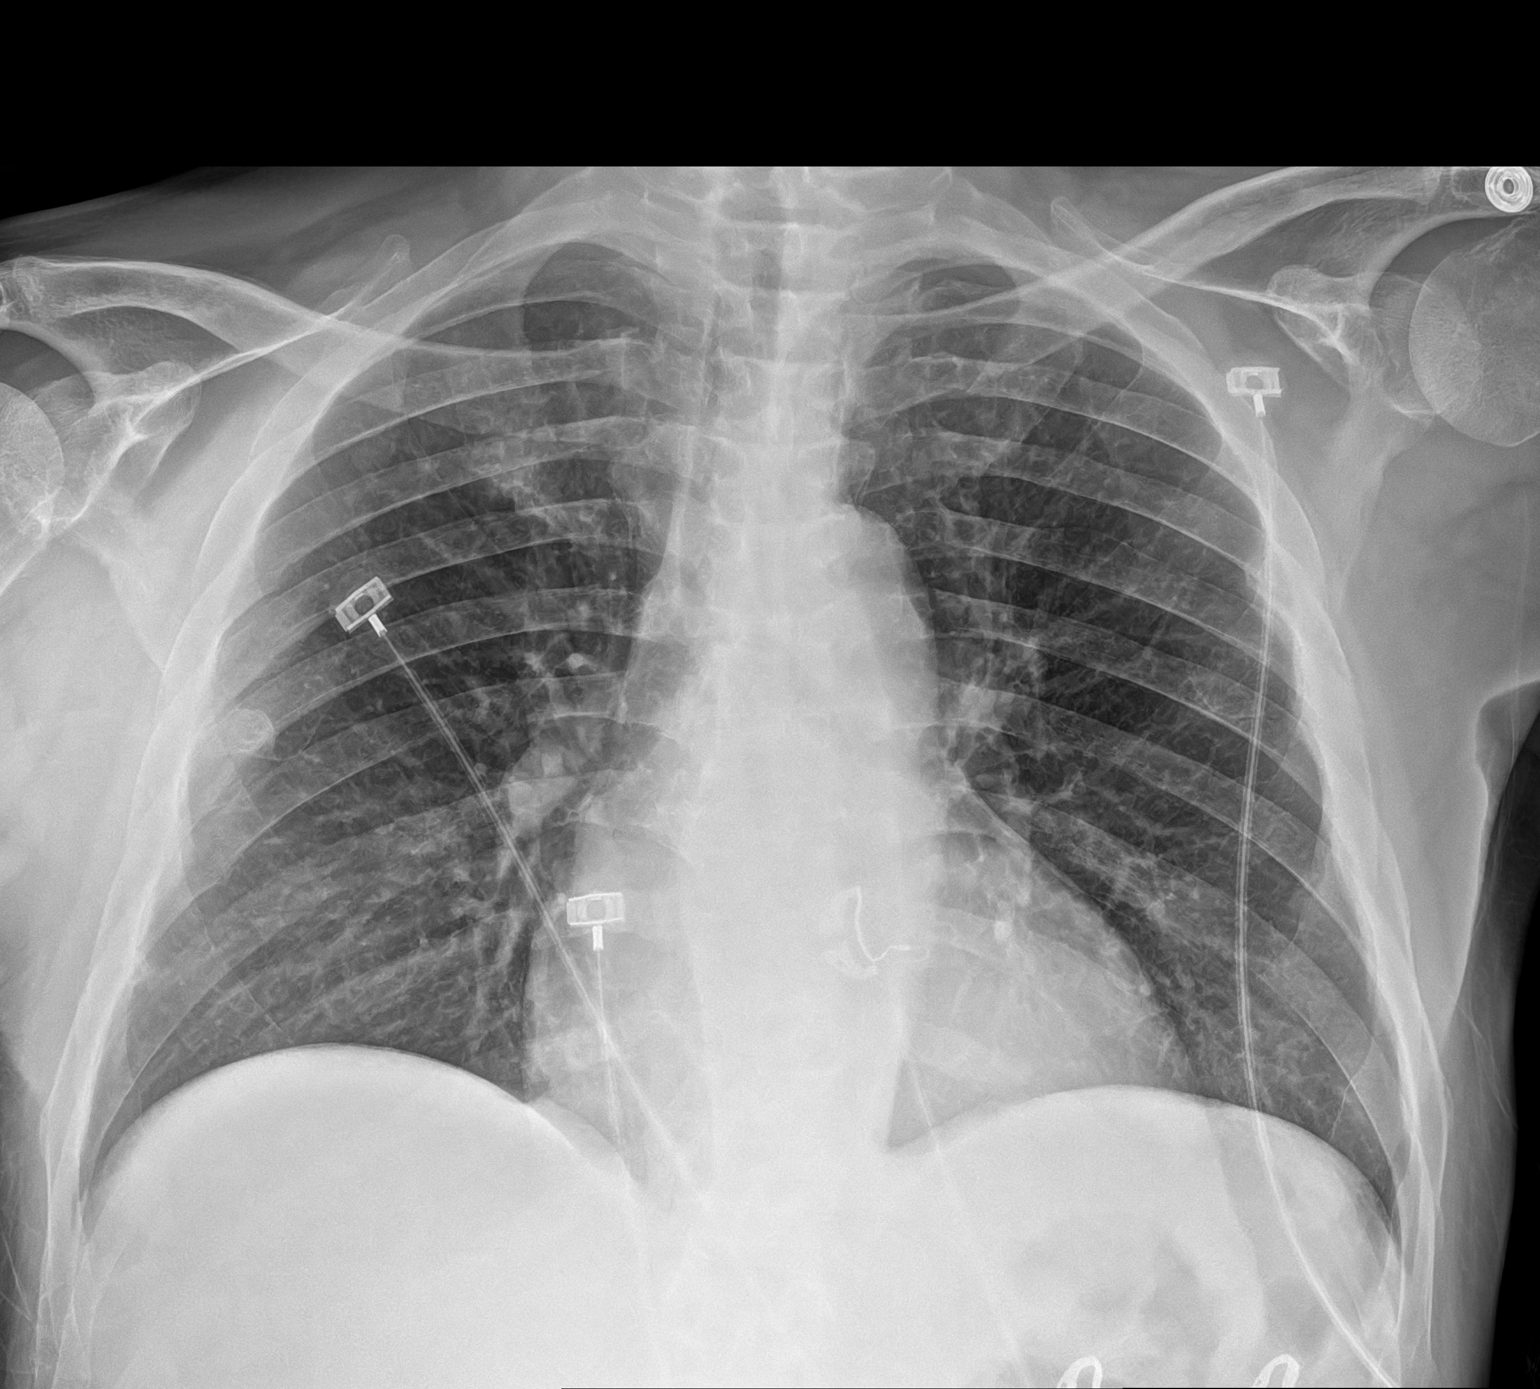

[1 of 1 positions shown; findings below may reference images not displayed]

FINDINGS: The heart size and mediastinal contours are within normal limits.
Aortic atherosclerosis. Carotid artery calcifications. No focal
airspace consolidation. No visible pleural effusion or pneumothorax.
The visualized skeletal structures are unremarkable.
IMPRESSION: No acute cardiopulmonary disease on this single view radiograph of
the chest.

## 2024-02-27 IMAGING — CT CT CHEST-ABD-PELV W/ CM
2 of 5 series · 11 of 36 positions shown, 16 images · IV contrast (Omnipaque or Isovue)
Comparison: None Available.

CLINICAL DATA: Esophageal mass on upper endoscopy. "STAT" staging
CT scan of the chest, abdomen and pelvis ordered.

EXAM:
CT CHEST, ABDOMEN, AND PELVIS WITH CONTRAST
TECHNIQUE: Multidetector CT imaging of the chest, abdomen and pelvis was
performed following the standard protocol during bolus
administration of intravenous contrast.

[Series 2: cap with · axial · 0.70mm/px · z∈[-370,+120]mm · 8 of 126 slices shown, 13 images]
[im 14/126  mediastinal]
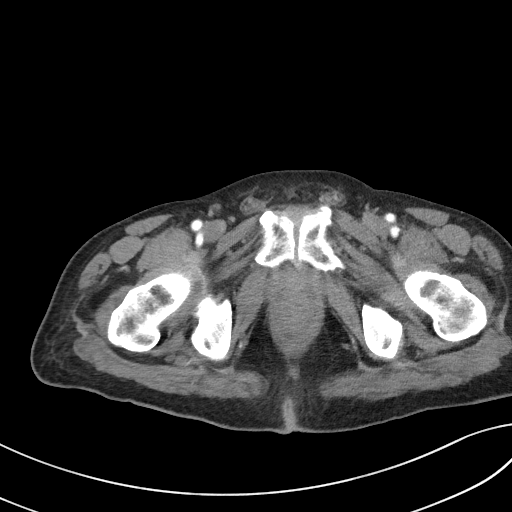
[im 14/126  bone]
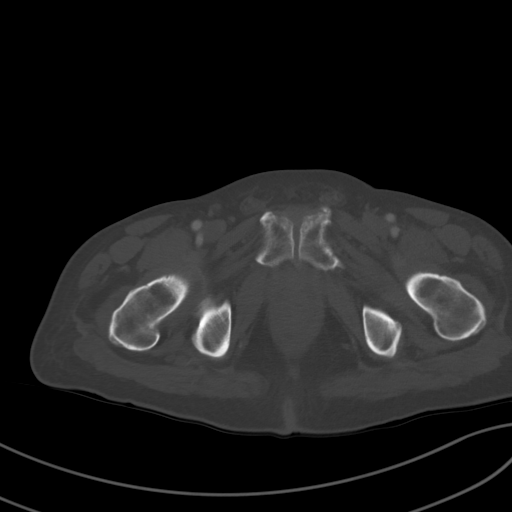
[im 28/126  mediastinal]
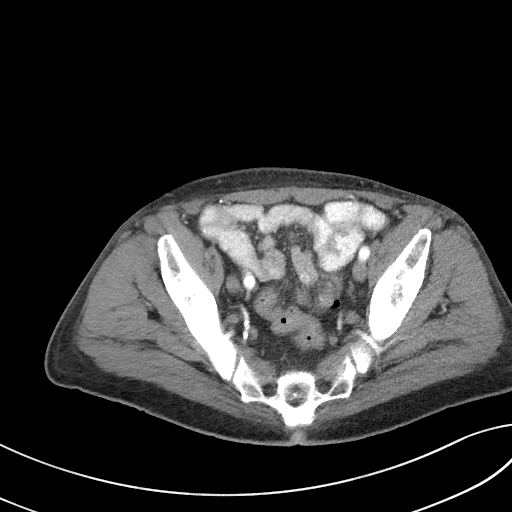
[im 42/126  mediastinal]
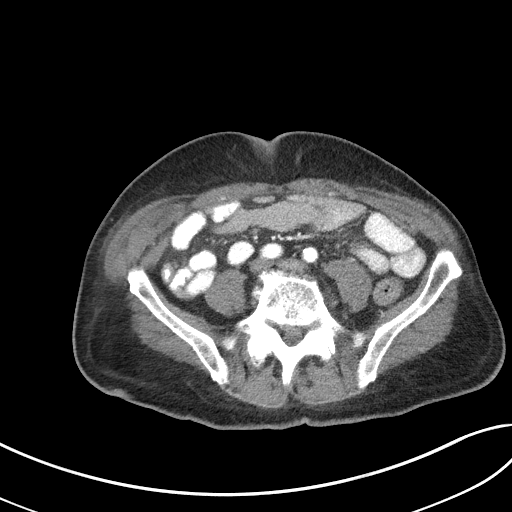
[im 56/126  mediastinal]
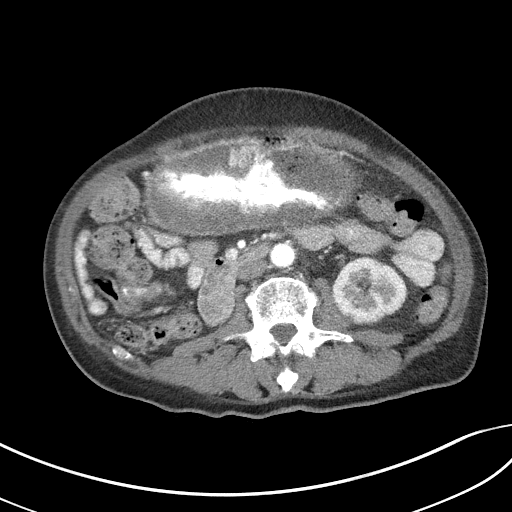
[im 70/126  mediastinal]
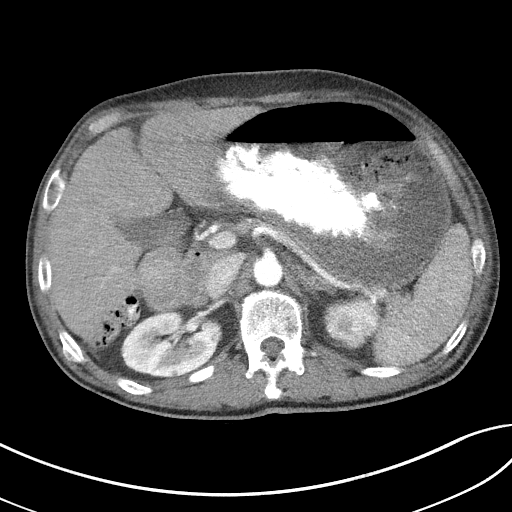
[im 70/126  lung]
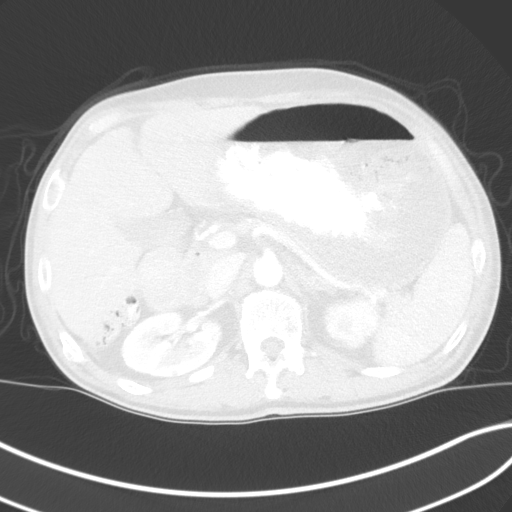
[im 84/126  mediastinal]
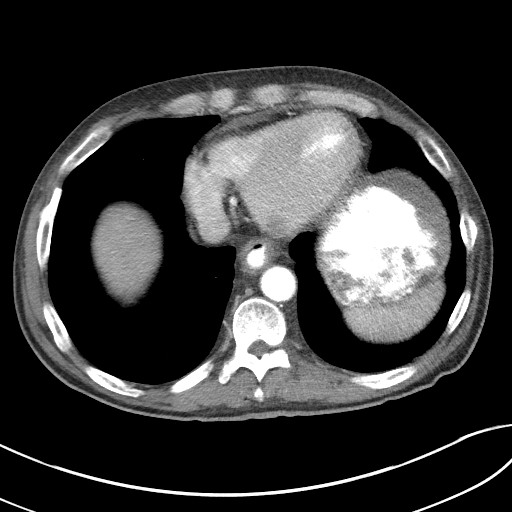
[im 84/126  lung]
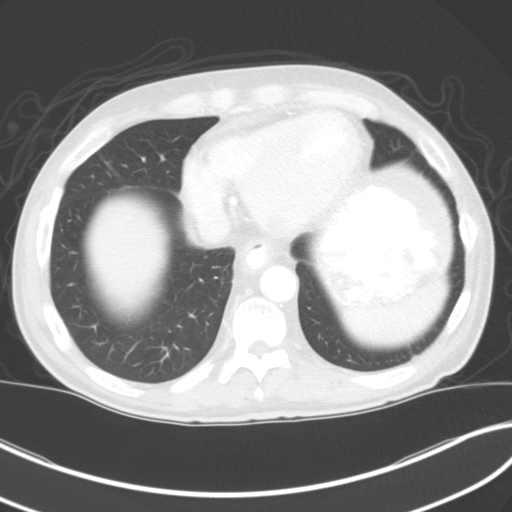
[im 98/126  mediastinal]
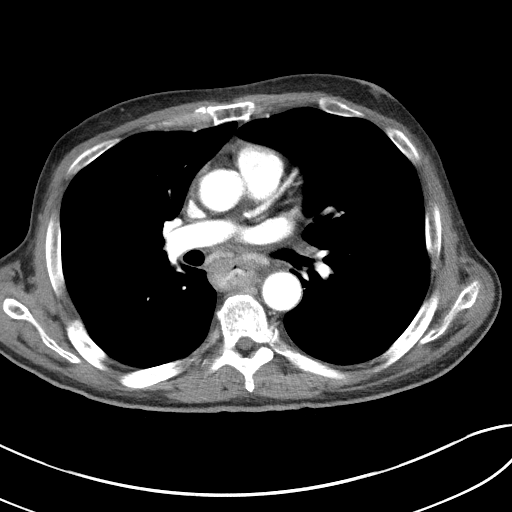
[im 98/126  lung]
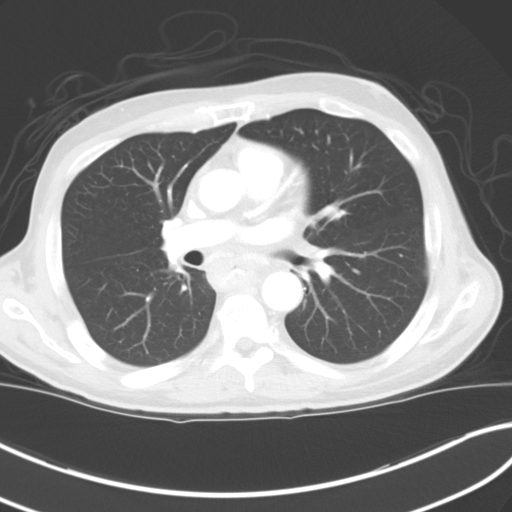
[im 112/126  mediastinal]
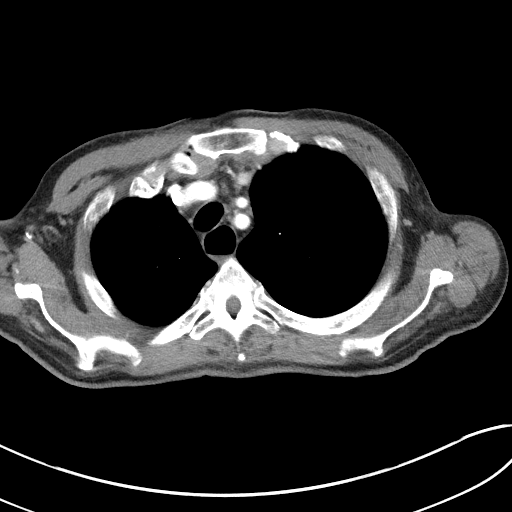
[im 112/126  lung]
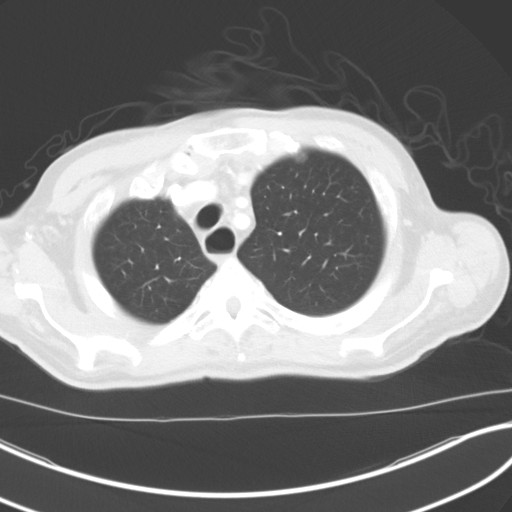

[Series 5: coronals · coronal · 0.91mm/px · 3 of 137 slices shown]
[im 28/137  mediastinal]
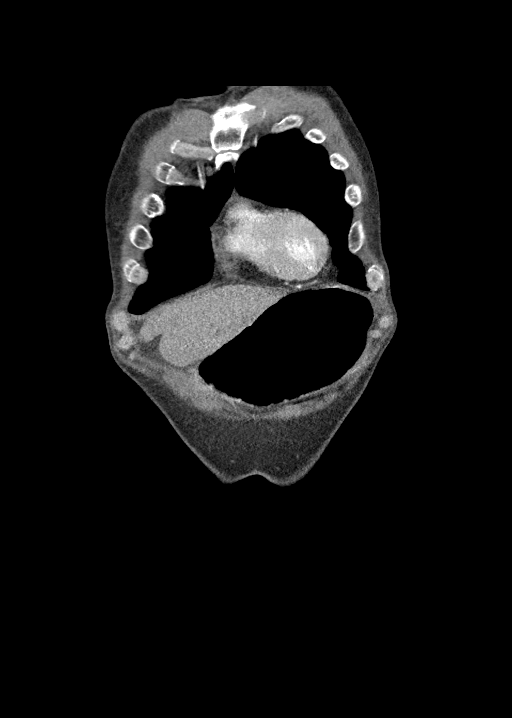
[im 55/137  mediastinal]
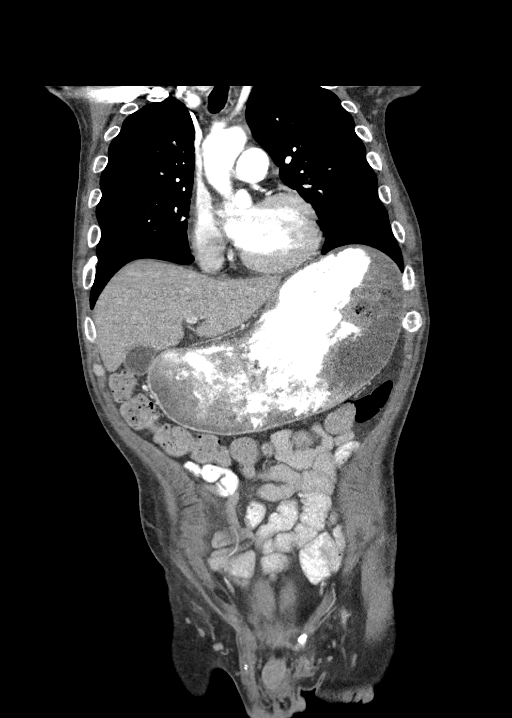
[im 82/137  mediastinal]
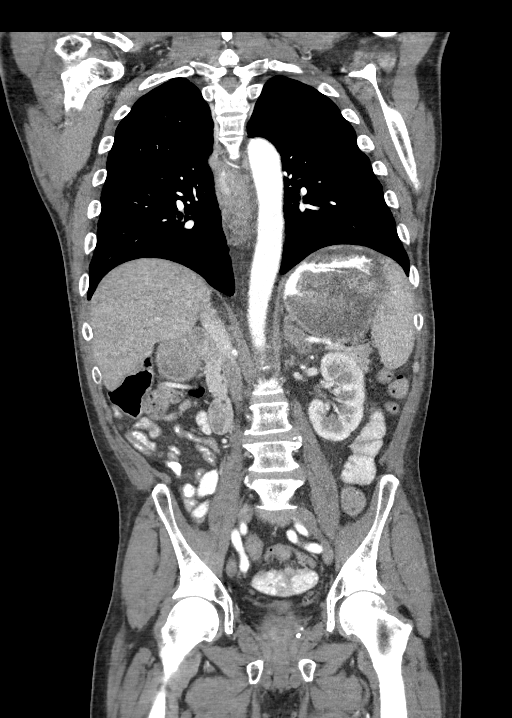

[11 of 36 positions shown; findings below may reference images not displayed]

RADIATION DOSE REDUCTION: This exam was performed according to the
departmental dose-optimization program which includes automated
exposure control, adjustment of the mA and/or kV according to
patient size and/or use of iterative reconstruction technique.

CONTRAST:  100mL OMNIPAQUE IOHEXOL 300 MG/ML  SOLN
FINDINGS: CT CHEST FINDINGS

Cardiovascular: 2 vessel aortic arch. The right brachiocephalic and
left common carotid artery share a common origin. No evidence of
aneurysm or dissection. The main pulmonary artery is normal in size.
The heart is normal in size. No pericardial effusion.

Mediastinum/Nodes: Normal thyroid gland. No mediastinal adenopathy.
Irregular circumferential wall thickening in the mid and distal
thoracic esophagus for a length of approximately 9 cm consistent
with the clinical history of esophageal mass. Haziness in the
periesophageal fat in the retroperitoneal space suggest transmural
extension. Small 6 mm juxta esophageal lymph node (image 25 series
2).

Lungs/Pleura: Small focus of ground-glass attenuation airspace
opacities in a tree-in-bud configuration in the posterior aspect of
the right upper lobe. Tiny 2 mm subpleural pulmonary nodule along
the minor fissure (image 76 series 4). Focal smooth endobronchial
nodule in the lower aspect of the left upper lobe (images 60 5-69 of
series 4). There is associated narrowing of a segmental branch of
the lingula. No pleural effusion or pneumothorax.

Musculoskeletal: No acute fracture or aggressive appearing lytic or
blastic osseous lesion.

CT ABDOMEN PELVIS FINDINGS

Hepatobiliary: No focal liver abnormality is seen. No gallstones,
gallbladder wall thickening, or biliary dilatation.

Pancreas: Unremarkable. No pancreatic ductal dilatation or
surrounding inflammatory changes.

Spleen: Normal in size without focal abnormality.

Adrenals/Urinary Tract: Adrenal glands are unremarkable. Kidneys are
normal, without renal calculi, focal lesion, or hydronephrosis.
Bladder is unremarkable.

Stomach/Bowel: Distension of the stomach and proximal duodenum
without evidence of obstructing mass by CT imaging. This may
represent recent ingestion of a meal.

Vascular/Lymphatic: No significant vascular findings are present. No
enlarged abdominal or pelvic lymph nodes.

Reproductive: Prostate is unremarkable.

Other: No abdominal wall hernia or abnormality. No abdominopelvic
ascites.

Musculoskeletal: No acute fracture or aggressive appearing lytic or
blastic osseous lesion. Remote healed bilateral rib fractures.
IMPRESSION: 1. Circumferential irregular wall thickening along a length of
approximately 9 cm in the mid and distal thoracic esophagus
consistent with the clinical history of esophageal neoplasm.
Haziness in the periesophageal fat suggests transmural extension of
tumor. Small adjacent 6 mm paraesophageal lymph node likely
represents nodal metastasis.
2. Approximately 1.3 cm endobronchial versus Peri bronchial smooth
nodule adjacent to a segmental bronchus within the lingula.
Differential considerations include intrapulmonary lymph node,
endobronchial neoplasm (carcinoid is a consideration), or less
likely metastatic disease. PET-CT and/or bronchoscopy could further
evaluate.
3. Focal patch of ground-glass attenuation airspace opacity in the
posterior right upper lobe suggests an active
infectious/inflammatory process such as early bronchopneumonia or
small volume aspiration.
4. Marked distension of the stomach and proximal duodenum without
evidence of obstructing mass. Findings may reflect a recent large
meal.
5. Tiny 2 mm subpleural pulmonary nodule along the minor fissure is
highly likely benign. Recommend attention on future follow-up
imaging.
6. Remote bilateral healed rib fractures.
